# Patient Record
Sex: Female | Born: 1945 | Race: Black or African American | Hispanic: No | Smoking: Former smoker
Health system: Southern US, Community
[De-identification: ages and names within clinical notes are randomized; demographics above are authoritative.]

## PROBLEM LIST (undated history)

## (undated) DIAGNOSIS — N990 Postprocedural (acute) (chronic) kidney failure: Secondary | ICD-10-CM

## (undated) DIAGNOSIS — R011 Cardiac murmur, unspecified: Secondary | ICD-10-CM

## (undated) DIAGNOSIS — Z87448 Personal history of other diseases of urinary system: Secondary | ICD-10-CM

## (undated) DIAGNOSIS — M5410 Radiculopathy, site unspecified: Secondary | ICD-10-CM

## (undated) DIAGNOSIS — Z8619 Personal history of other infectious and parasitic diseases: Secondary | ICD-10-CM

## (undated) DIAGNOSIS — I639 Cerebral infarction, unspecified: Secondary | ICD-10-CM

## (undated) DIAGNOSIS — L853 Xerosis cutis: Secondary | ICD-10-CM

## (undated) DIAGNOSIS — I10 Essential (primary) hypertension: Secondary | ICD-10-CM

## (undated) DIAGNOSIS — M109 Gout, unspecified: Secondary | ICD-10-CM

## (undated) DIAGNOSIS — G4730 Sleep apnea, unspecified: Secondary | ICD-10-CM

## (undated) DIAGNOSIS — K219 Gastro-esophageal reflux disease without esophagitis: Secondary | ICD-10-CM

## (undated) DIAGNOSIS — J189 Pneumonia, unspecified organism: Secondary | ICD-10-CM

## (undated) DIAGNOSIS — T4145XA Adverse effect of unspecified anesthetic, initial encounter: Secondary | ICD-10-CM

## (undated) DIAGNOSIS — M75101 Unspecified rotator cuff tear or rupture of right shoulder, not specified as traumatic: Secondary | ICD-10-CM

## (undated) DIAGNOSIS — M7541 Impingement syndrome of right shoulder: Secondary | ICD-10-CM

## (undated) DIAGNOSIS — G629 Polyneuropathy, unspecified: Secondary | ICD-10-CM

## (undated) DIAGNOSIS — T8859XA Other complications of anesthesia, initial encounter: Secondary | ICD-10-CM

## (undated) DIAGNOSIS — M19019 Primary osteoarthritis, unspecified shoulder: Secondary | ICD-10-CM

## (undated) DIAGNOSIS — M1990 Unspecified osteoarthritis, unspecified site: Secondary | ICD-10-CM

## (undated) HISTORY — PX: TONSILLECTOMY: SUR1361

## (undated) HISTORY — PX: CARPAL TUNNEL RELEASE: SHX101

## (undated) HISTORY — DX: Cerebral infarction, unspecified: I63.9

## (undated) HISTORY — PX: BACK SURGERY: SHX140

## (undated) HISTORY — PX: ABDOMINAL HYSTERECTOMY: SHX81

## (undated) HISTORY — PX: APPENDECTOMY: SHX54

## (undated) HISTORY — PX: COLONOSCOPY: SHX174

## (undated) HISTORY — PX: TOE SURGERY: SHX1073

## (undated) HISTORY — PX: OTHER SURGICAL HISTORY: SHX169

## (undated) HISTORY — PX: JOINT REPLACEMENT: SHX530

---

## 1998-09-12 ENCOUNTER — Ambulatory Visit (HOSPITAL_COMMUNITY): Admission: RE | Admit: 1998-09-12 | Discharge: 1998-09-12 | Payer: Self-pay | Admitting: Family Medicine

## 1998-09-12 ENCOUNTER — Encounter: Payer: Self-pay | Admitting: Family Medicine

## 1999-01-09 ENCOUNTER — Other Ambulatory Visit: Admission: RE | Admit: 1999-01-09 | Discharge: 1999-01-09 | Payer: Self-pay | Admitting: Family Medicine

## 1999-03-06 ENCOUNTER — Encounter: Payer: Self-pay | Admitting: Family Medicine

## 1999-03-06 ENCOUNTER — Ambulatory Visit (HOSPITAL_COMMUNITY): Admission: RE | Admit: 1999-03-06 | Discharge: 1999-03-06 | Payer: Self-pay | Admitting: Family Medicine

## 1999-03-20 ENCOUNTER — Ambulatory Visit (HOSPITAL_COMMUNITY): Admission: RE | Admit: 1999-03-20 | Discharge: 1999-03-20 | Payer: Self-pay | Admitting: Family Medicine

## 1999-03-20 ENCOUNTER — Encounter: Payer: Self-pay | Admitting: Family Medicine

## 2000-01-21 ENCOUNTER — Encounter: Payer: Self-pay | Admitting: Emergency Medicine

## 2000-01-21 ENCOUNTER — Emergency Department (HOSPITAL_COMMUNITY): Admission: EM | Admit: 2000-01-21 | Discharge: 2000-01-21 | Payer: Self-pay | Admitting: Emergency Medicine

## 2000-01-29 ENCOUNTER — Ambulatory Visit (HOSPITAL_COMMUNITY): Admission: RE | Admit: 2000-01-29 | Discharge: 2000-01-29 | Payer: Self-pay | Admitting: *Deleted

## 2000-02-03 ENCOUNTER — Ambulatory Visit (HOSPITAL_COMMUNITY): Admission: RE | Admit: 2000-02-03 | Discharge: 2000-02-03 | Payer: Self-pay | Admitting: *Deleted

## 2000-03-22 ENCOUNTER — Encounter: Payer: Self-pay | Admitting: Family Medicine

## 2000-03-22 ENCOUNTER — Ambulatory Visit (HOSPITAL_COMMUNITY): Admission: RE | Admit: 2000-03-22 | Discharge: 2000-03-22 | Payer: Self-pay | Admitting: Family Medicine

## 2001-08-23 ENCOUNTER — Encounter: Payer: Self-pay | Admitting: Orthopedic Surgery

## 2001-08-23 ENCOUNTER — Ambulatory Visit (HOSPITAL_COMMUNITY): Admission: RE | Admit: 2001-08-23 | Discharge: 2001-08-23 | Payer: Self-pay | Admitting: Orthopedic Surgery

## 2001-10-10 ENCOUNTER — Encounter: Payer: Self-pay | Admitting: Family Medicine

## 2001-10-10 ENCOUNTER — Ambulatory Visit (HOSPITAL_COMMUNITY): Admission: RE | Admit: 2001-10-10 | Discharge: 2001-10-10 | Payer: Self-pay | Admitting: Family Medicine

## 2002-12-13 ENCOUNTER — Encounter: Admission: RE | Admit: 2002-12-13 | Discharge: 2002-12-13 | Payer: Self-pay | Admitting: Family Medicine

## 2002-12-13 ENCOUNTER — Encounter: Payer: Self-pay | Admitting: Family Medicine

## 2003-01-03 ENCOUNTER — Encounter (HOSPITAL_BASED_OUTPATIENT_CLINIC_OR_DEPARTMENT_OTHER): Payer: Self-pay | Admitting: General Surgery

## 2003-01-04 ENCOUNTER — Ambulatory Visit (HOSPITAL_COMMUNITY): Admission: RE | Admit: 2003-01-04 | Discharge: 2003-01-04 | Payer: Self-pay | Admitting: General Surgery

## 2003-06-12 ENCOUNTER — Other Ambulatory Visit: Admission: RE | Admit: 2003-06-12 | Discharge: 2003-06-12 | Payer: Self-pay | Admitting: Family Medicine

## 2003-06-28 ENCOUNTER — Ambulatory Visit (HOSPITAL_BASED_OUTPATIENT_CLINIC_OR_DEPARTMENT_OTHER): Admission: RE | Admit: 2003-06-28 | Discharge: 2003-06-28 | Payer: Self-pay | Admitting: Family Medicine

## 2005-07-21 ENCOUNTER — Ambulatory Visit (HOSPITAL_COMMUNITY): Admission: RE | Admit: 2005-07-21 | Discharge: 2005-07-21 | Payer: Self-pay | Admitting: Family Medicine

## 2006-04-12 ENCOUNTER — Emergency Department (HOSPITAL_COMMUNITY): Admission: EM | Admit: 2006-04-12 | Discharge: 2006-04-12 | Payer: Self-pay | Admitting: Family Medicine

## 2006-04-20 ENCOUNTER — Emergency Department (HOSPITAL_COMMUNITY): Admission: EM | Admit: 2006-04-20 | Discharge: 2006-04-20 | Payer: Self-pay | Admitting: Family Medicine

## 2006-05-13 ENCOUNTER — Emergency Department (HOSPITAL_COMMUNITY): Admission: EM | Admit: 2006-05-13 | Discharge: 2006-05-13 | Payer: Self-pay | Admitting: Family Medicine

## 2006-05-18 ENCOUNTER — Emergency Department (HOSPITAL_COMMUNITY): Admission: EM | Admit: 2006-05-18 | Discharge: 2006-05-18 | Payer: Self-pay | Admitting: Family Medicine

## 2006-09-22 ENCOUNTER — Ambulatory Visit: Payer: Self-pay | Admitting: General Practice

## 2007-01-23 ENCOUNTER — Emergency Department (HOSPITAL_COMMUNITY): Admission: EM | Admit: 2007-01-23 | Discharge: 2007-01-23 | Payer: Self-pay | Admitting: Emergency Medicine

## 2008-10-03 ENCOUNTER — Ambulatory Visit: Payer: Self-pay | Admitting: General Practice

## 2008-12-07 HISTORY — PX: REPLACEMENT TOTAL KNEE: SUR1224

## 2009-02-25 ENCOUNTER — Emergency Department (HOSPITAL_COMMUNITY): Admission: EM | Admit: 2009-02-25 | Discharge: 2009-02-25 | Payer: Self-pay | Admitting: Emergency Medicine

## 2009-10-09 ENCOUNTER — Ambulatory Visit: Payer: Self-pay | Admitting: General Practice

## 2009-11-27 ENCOUNTER — Inpatient Hospital Stay (HOSPITAL_COMMUNITY): Admission: RE | Admit: 2009-11-27 | Discharge: 2009-12-03 | Payer: Self-pay | Admitting: Orthopedic Surgery

## 2009-12-06 ENCOUNTER — Emergency Department (HOSPITAL_COMMUNITY): Admission: EM | Admit: 2009-12-06 | Discharge: 2009-12-06 | Payer: Self-pay | Admitting: Emergency Medicine

## 2010-09-02 ENCOUNTER — Other Ambulatory Visit: Admission: RE | Admit: 2010-09-02 | Discharge: 2010-09-02 | Payer: Self-pay | Admitting: Family Medicine

## 2010-10-01 ENCOUNTER — Ambulatory Visit: Payer: Self-pay | Admitting: General Practice

## 2011-03-09 LAB — POCT I-STAT 3, ART BLOOD GAS (G3+)
Acid-base deficit: 6 mmol/L — ABNORMAL HIGH (ref 0.0–2.0)
Bicarbonate: 19.4 mEq/L — ABNORMAL LOW (ref 20.0–24.0)
O2 Saturation: 91 %
Patient temperature: 98.7
TCO2: 20 mmol/L (ref 0–100)
pCO2 arterial: 37.2 mmHg (ref 35.0–45.0)
pH, Arterial: 7.326 — ABNORMAL LOW (ref 7.350–7.400)
pO2, Arterial: 65 mmHg — ABNORMAL LOW (ref 80.0–100.0)

## 2011-03-09 LAB — BASIC METABOLIC PANEL
BUN: 13 mg/dL (ref 6–23)
BUN: 17 mg/dL (ref 6–23)
BUN: 23 mg/dL (ref 6–23)
BUN: 3 mg/dL — ABNORMAL LOW (ref 6–23)
CO2: 22 mEq/L (ref 19–32)
CO2: 23 mEq/L (ref 19–32)
CO2: 24 mEq/L (ref 19–32)
CO2: 31 mEq/L (ref 19–32)
Calcium: 8.2 mg/dL — ABNORMAL LOW (ref 8.4–10.5)
Calcium: 8.2 mg/dL — ABNORMAL LOW (ref 8.4–10.5)
Calcium: 8.4 mg/dL (ref 8.4–10.5)
Calcium: 9.2 mg/dL (ref 8.4–10.5)
Chloride: 101 mEq/L (ref 96–112)
Chloride: 103 mEq/L (ref 96–112)
Chloride: 111 mEq/L (ref 96–112)
Chloride: 99 mEq/L (ref 96–112)
Creatinine, Ser: 0.75 mg/dL (ref 0.4–1.2)
Creatinine, Ser: 1.1 mg/dL (ref 0.4–1.2)
Creatinine, Ser: 2.15 mg/dL — ABNORMAL HIGH (ref 0.4–1.2)
Creatinine, Ser: 3.09 mg/dL — ABNORMAL HIGH (ref 0.4–1.2)
GFR calc Af Amer: 18 mL/min — ABNORMAL LOW (ref >60)
GFR calc Af Amer: 28 mL/min — ABNORMAL LOW (ref >60)
GFR calc Af Amer: >60 mL/min (ref >60)
GFR calc Af Amer: >60 mL/min (ref >60)
GFR calc non Af Amer: 15 mL/min — ABNORMAL LOW (ref >60)
GFR calc non Af Amer: 23 mL/min — ABNORMAL LOW (ref >60)
GFR calc non Af Amer: 50 mL/min — ABNORMAL LOW (ref >60)
GFR calc non Af Amer: >60 mL/min (ref >60)
Glucose, Bld: 100 mg/dL — ABNORMAL HIGH (ref 70–99)
Glucose, Bld: 111 mg/dL — ABNORMAL HIGH (ref 70–99)
Glucose, Bld: 117 mg/dL — ABNORMAL HIGH (ref 70–99)
Glucose, Bld: 149 mg/dL — ABNORMAL HIGH (ref 70–99)
Potassium: 3 mEq/L — ABNORMAL LOW (ref 3.5–5.1)
Potassium: 3.9 mEq/L (ref 3.5–5.1)
Potassium: 4.1 mEq/L (ref 3.5–5.1)
Potassium: 4.3 mEq/L (ref 3.5–5.1)
Sodium: 137 mEq/L (ref 135–145)
Sodium: 137 mEq/L (ref 135–145)
Sodium: 141 mEq/L (ref 135–145)
Sodium: 142 mEq/L (ref 135–145)

## 2011-03-09 LAB — DIFFERENTIAL
Basophils Absolute: 0 10*3/uL (ref 0.0–0.1)
Basophils Absolute: 0 10*3/uL (ref 0.0–0.1)
Basophils Absolute: 0 10*3/uL (ref 0.0–0.1)
Basophils Absolute: 0 10*3/uL (ref 0.0–0.1)
Basophils Absolute: 0 10*3/uL (ref 0.0–0.1)
Basophils Absolute: 0 10*3/uL (ref 0.0–0.1)
Basophils Relative: 0 % (ref 0–1)
Basophils Relative: 0 % (ref 0–1)
Basophils Relative: 0 % (ref 0–1)
Basophils Relative: 0 % (ref 0–1)
Basophils Relative: 0 % (ref 0–1)
Basophils Relative: 0 % (ref 0–1)
Eosinophils Absolute: 0 10*3/uL (ref 0.0–0.7)
Eosinophils Absolute: 0 10*3/uL (ref 0.0–0.7)
Eosinophils Absolute: 0.2 10*3/uL (ref 0.0–0.7)
Eosinophils Absolute: 0.2 10*3/uL (ref 0.0–0.7)
Eosinophils Absolute: 0.3 10*3/uL (ref 0.0–0.7)
Eosinophils Absolute: 0.3 10*3/uL (ref 0.0–0.7)
Eosinophils Relative: 0 % (ref 0–5)
Eosinophils Relative: 0 % (ref 0–5)
Eosinophils Relative: 2 % (ref 0–5)
Eosinophils Relative: 2 % (ref 0–5)
Eosinophils Relative: 3 % (ref 0–5)
Eosinophils Relative: 4 % (ref 0–5)
Lymphocytes Relative: 21 % (ref 12–46)
Lymphocytes Relative: 21 % (ref 12–46)
Lymphocytes Relative: 27 % (ref 12–46)
Lymphocytes Relative: 28 % (ref 12–46)
Lymphocytes Relative: 28 % (ref 12–46)
Lymphocytes Relative: 55 % — ABNORMAL HIGH (ref 12–46)
Lymphs Abs: 2.3 10*3/uL (ref 0.7–4.0)
Lymphs Abs: 2.3 10*3/uL (ref 0.7–4.0)
Lymphs Abs: 2.8 10*3/uL (ref 0.7–4.0)
Lymphs Abs: 3.1 10*3/uL (ref 0.7–4.0)
Lymphs Abs: 3.3 10*3/uL (ref 0.7–4.0)
Lymphs Abs: 7.1 10*3/uL — ABNORMAL HIGH (ref 0.7–4.0)
Monocytes Absolute: 0.9 10*3/uL (ref 0.1–1.0)
Monocytes Absolute: 1.2 10*3/uL — ABNORMAL HIGH (ref 0.1–1.0)
Monocytes Absolute: 1.3 10*3/uL — ABNORMAL HIGH (ref 0.1–1.0)
Monocytes Absolute: 1.4 10*3/uL — ABNORMAL HIGH (ref 0.1–1.0)
Monocytes Absolute: 1.8 10*3/uL — ABNORMAL HIGH (ref 0.1–1.0)
Monocytes Absolute: 2.1 10*3/uL — ABNORMAL HIGH (ref 0.1–1.0)
Monocytes Relative: 12 % (ref 3–12)
Monocytes Relative: 13 % — ABNORMAL HIGH (ref 3–12)
Monocytes Relative: 14 % — ABNORMAL HIGH (ref 3–12)
Monocytes Relative: 15 % — ABNORMAL HIGH (ref 3–12)
Monocytes Relative: 15 % — ABNORMAL HIGH (ref 3–12)
Monocytes Relative: 7 % (ref 3–12)
Neutro Abs: 4.5 10*3/uL (ref 1.7–7.7)
Neutro Abs: 4.7 10*3/uL (ref 1.7–7.7)
Neutro Abs: 4.7 10*3/uL (ref 1.7–7.7)
Neutro Abs: 6.9 10*3/uL (ref 1.7–7.7)
Neutro Abs: 9 10*3/uL — ABNORMAL HIGH (ref 1.7–7.7)
Neutro Abs: 9.3 10*3/uL — ABNORMAL HIGH (ref 1.7–7.7)
Neutrophils Relative %: 36 % — ABNORMAL LOW (ref 43–77)
Neutrophils Relative %: 54 % (ref 43–77)
Neutrophils Relative %: 55 % (ref 43–77)
Neutrophils Relative %: 58 % (ref 43–77)
Neutrophils Relative %: 64 % (ref 43–77)
Neutrophils Relative %: 66 % (ref 43–77)

## 2011-03-09 LAB — LIPASE, BLOOD: Lipase: 26 U/L (ref 11–59)

## 2011-03-09 LAB — COMPREHENSIVE METABOLIC PANEL
ALT: 13 U/L (ref 0–35)
ALT: 22 U/L (ref 0–35)
ALT: 27 U/L (ref 0–35)
ALT: 28 U/L (ref 0–35)
AST: 28 U/L (ref 0–37)
AST: 32 U/L (ref 0–37)
AST: 38 U/L — ABNORMAL HIGH (ref 0–37)
AST: 40 U/L — ABNORMAL HIGH (ref 0–37)
Albumin: 2.3 g/dL — ABNORMAL LOW (ref 3.5–5.2)
Albumin: 2.3 g/dL — ABNORMAL LOW (ref 3.5–5.2)
Albumin: 2.7 g/dL — ABNORMAL LOW (ref 3.5–5.2)
Albumin: 4 g/dL (ref 3.5–5.2)
Alkaline Phosphatase: 107 U/L (ref 39–117)
Alkaline Phosphatase: 132 U/L — ABNORMAL HIGH (ref 39–117)
Alkaline Phosphatase: 134 U/L — ABNORMAL HIGH (ref 39–117)
Alkaline Phosphatase: 79 U/L (ref 39–117)
BUN: 2 mg/dL — ABNORMAL LOW (ref 6–23)
BUN: 23 mg/dL (ref 6–23)
BUN: 5 mg/dL — ABNORMAL LOW (ref 6–23)
BUN: 6 mg/dL (ref 6–23)
CO2: 22 mEq/L (ref 19–32)
CO2: 27 mEq/L (ref 19–32)
CO2: 27 mEq/L (ref 19–32)
CO2: 28 mEq/L (ref 19–32)
Calcium: 10.3 mg/dL (ref 8.4–10.5)
Calcium: 7.6 mg/dL — ABNORMAL LOW (ref 8.4–10.5)
Calcium: 8.4 mg/dL (ref 8.4–10.5)
Calcium: 8.6 mg/dL (ref 8.4–10.5)
Chloride: 107 mEq/L (ref 96–112)
Chloride: 107 mEq/L (ref 96–112)
Chloride: 108 mEq/L (ref 96–112)
Chloride: 109 mEq/L (ref 96–112)
Creatinine, Ser: 0.81 mg/dL (ref 0.4–1.2)
Creatinine, Ser: 0.81 mg/dL (ref 0.4–1.2)
Creatinine, Ser: 0.83 mg/dL (ref 0.4–1.2)
Creatinine, Ser: 2.32 mg/dL — ABNORMAL HIGH (ref 0.4–1.2)
GFR calc Af Amer: 26 mL/min — ABNORMAL LOW (ref >60)
GFR calc Af Amer: >60 mL/min (ref >60)
GFR calc Af Amer: >60 mL/min (ref >60)
GFR calc Af Amer: >60 mL/min (ref >60)
GFR calc non Af Amer: 21 mL/min — ABNORMAL LOW (ref >60)
GFR calc non Af Amer: >60 mL/min (ref >60)
GFR calc non Af Amer: >60 mL/min (ref >60)
GFR calc non Af Amer: >60 mL/min (ref >60)
Glucose, Bld: 113 mg/dL — ABNORMAL HIGH (ref 70–99)
Glucose, Bld: 88 mg/dL (ref 70–99)
Glucose, Bld: 97 mg/dL (ref 70–99)
Glucose, Bld: 99 mg/dL (ref 70–99)
Potassium: 3.1 mEq/L — ABNORMAL LOW (ref 3.5–5.1)
Potassium: 3.5 mEq/L (ref 3.5–5.1)
Potassium: 4 mEq/L (ref 3.5–5.1)
Potassium: 4.3 mEq/L (ref 3.5–5.1)
Sodium: 139 mEq/L (ref 135–145)
Sodium: 139 mEq/L (ref 135–145)
Sodium: 142 mEq/L (ref 135–145)
Sodium: 143 mEq/L (ref 135–145)
Total Bilirubin: 0.5 mg/dL (ref 0.3–1.2)
Total Bilirubin: 0.5 mg/dL (ref 0.3–1.2)
Total Bilirubin: 0.7 mg/dL (ref 0.3–1.2)
Total Bilirubin: 0.9 mg/dL (ref 0.3–1.2)
Total Protein: 6.1 g/dL (ref 6.0–8.3)
Total Protein: 6.1 g/dL (ref 6.0–8.3)
Total Protein: 6.4 g/dL (ref 6.0–8.3)
Total Protein: 8.1 g/dL (ref 6.0–8.3)

## 2011-03-09 LAB — CBC
HCT: 23.1 % — ABNORMAL LOW (ref 36.0–46.0)
HCT: 24 % — ABNORMAL LOW (ref 36.0–46.0)
HCT: 24.4 % — ABNORMAL LOW (ref 36.0–46.0)
HCT: 25.2 % — ABNORMAL LOW (ref 36.0–46.0)
HCT: 25.5 % — ABNORMAL LOW (ref 36.0–46.0)
HCT: 27.2 % — ABNORMAL LOW (ref 36.0–46.0)
HCT: 27.9 % — ABNORMAL LOW (ref 36.0–46.0)
HCT: 29.4 % — ABNORMAL LOW (ref 36.0–46.0)
HCT: 32.3 % — ABNORMAL LOW (ref 36.0–46.0)
HCT: 40.2 % (ref 36.0–46.0)
Hemoglobin: 11 g/dL — ABNORMAL LOW (ref 12.0–15.0)
Hemoglobin: 13.7 g/dL (ref 12.0–15.0)
Hemoglobin: 7.9 g/dL — ABNORMAL LOW (ref 12.0–15.0)
Hemoglobin: 8.1 g/dL — ABNORMAL LOW (ref 12.0–15.0)
Hemoglobin: 8.2 g/dL — ABNORMAL LOW (ref 12.0–15.0)
Hemoglobin: 8.5 g/dL — ABNORMAL LOW (ref 12.0–15.0)
Hemoglobin: 8.6 g/dL — ABNORMAL LOW (ref 12.0–15.0)
Hemoglobin: 9.4 g/dL — ABNORMAL LOW (ref 12.0–15.0)
Hemoglobin: 9.5 g/dL — ABNORMAL LOW (ref 12.0–15.0)
Hemoglobin: 9.9 g/dL — ABNORMAL LOW (ref 12.0–15.0)
MCHC: 33.6 g/dL (ref 30.0–36.0)
MCHC: 33.7 g/dL (ref 30.0–36.0)
MCHC: 33.7 g/dL (ref 30.0–36.0)
MCHC: 33.8 g/dL (ref 30.0–36.0)
MCHC: 33.8 g/dL (ref 30.0–36.0)
MCHC: 33.9 g/dL (ref 30.0–36.0)
MCHC: 34 g/dL (ref 30.0–36.0)
MCHC: 34.1 g/dL (ref 30.0–36.0)
MCHC: 34.3 g/dL (ref 30.0–36.0)
MCHC: 34.9 g/dL (ref 30.0–36.0)
MCV: 83.8 fL (ref 78.0–100.0)
MCV: 84.4 fL (ref 78.0–100.0)
MCV: 84.5 fL (ref 78.0–100.0)
MCV: 84.6 fL (ref 78.0–100.0)
MCV: 84.6 fL (ref 78.0–100.0)
MCV: 84.6 fL (ref 78.0–100.0)
MCV: 84.6 fL (ref 78.0–100.0)
MCV: 84.7 fL (ref 78.0–100.0)
MCV: 84.7 fL (ref 78.0–100.0)
MCV: 85.9 fL (ref 78.0–100.0)
Platelets: 204 10*3/uL (ref 150–400)
Platelets: 205 10*3/uL (ref 150–400)
Platelets: 227 10*3/uL (ref 150–400)
Platelets: 229 10*3/uL (ref 150–400)
Platelets: 246 10*3/uL (ref 150–400)
Platelets: 251 10*3/uL (ref 150–400)
Platelets: 274 10*3/uL (ref 150–400)
Platelets: 281 10*3/uL (ref 150–400)
Platelets: 302 10*3/uL (ref 150–400)
Platelets: 471 10*3/uL — ABNORMAL HIGH (ref 150–400)
RBC: 2.73 MIL/uL — ABNORMAL LOW (ref 3.87–5.11)
RBC: 2.84 MIL/uL — ABNORMAL LOW (ref 3.87–5.11)
RBC: 2.88 MIL/uL — ABNORMAL LOW (ref 3.87–5.11)
RBC: 2.98 MIL/uL — ABNORMAL LOW (ref 3.87–5.11)
RBC: 3.02 MIL/uL — ABNORMAL LOW (ref 3.87–5.11)
RBC: 3.16 MIL/uL — ABNORMAL LOW (ref 3.87–5.11)
RBC: 3.3 MIL/uL — ABNORMAL LOW (ref 3.87–5.11)
RBC: 3.47 MIL/uL — ABNORMAL LOW (ref 3.87–5.11)
RBC: 3.82 MIL/uL — ABNORMAL LOW (ref 3.87–5.11)
RBC: 4.8 MIL/uL (ref 3.87–5.11)
RDW: 14.5 % (ref 11.5–15.5)
RDW: 14.5 % (ref 11.5–15.5)
RDW: 14.6 % (ref 11.5–15.5)
RDW: 14.7 % (ref 11.5–15.5)
RDW: 14.7 % (ref 11.5–15.5)
RDW: 14.7 % (ref 11.5–15.5)
RDW: 14.7 % (ref 11.5–15.5)
RDW: 15 % (ref 11.5–15.5)
RDW: 15 % (ref 11.5–15.5)
RDW: 15.1 % (ref 11.5–15.5)
WBC: 10.9 10*3/uL — ABNORMAL HIGH (ref 4.0–10.5)
WBC: 11.4 10*3/uL — ABNORMAL HIGH (ref 4.0–10.5)
WBC: 11.9 10*3/uL — ABNORMAL HIGH (ref 4.0–10.5)
WBC: 13 10*3/uL — ABNORMAL HIGH (ref 4.0–10.5)
WBC: 13.7 10*3/uL — ABNORMAL HIGH (ref 4.0–10.5)
WBC: 13.7 10*3/uL — ABNORMAL HIGH (ref 4.0–10.5)
WBC: 14.6 10*3/uL — ABNORMAL HIGH (ref 4.0–10.5)
WBC: 16.9 10*3/uL — ABNORMAL HIGH (ref 4.0–10.5)
WBC: 8.4 10*3/uL (ref 4.0–10.5)
WBC: 8.6 10*3/uL (ref 4.0–10.5)

## 2011-03-09 LAB — CULTURE, BLOOD (ROUTINE X 2)
Culture: NO GROWTH
Culture: NO GROWTH

## 2011-03-09 LAB — PROTIME-INR
INR: 0.94 (ref 0.00–1.49)
INR: 1.03 (ref 0.00–1.49)
INR: 1.56 — ABNORMAL HIGH (ref 0.00–1.49)
INR: 2.06 — ABNORMAL HIGH (ref 0.00–1.49)
INR: 2.15 — ABNORMAL HIGH (ref 0.00–1.49)
INR: 2.56 — ABNORMAL HIGH (ref 0.00–1.49)
INR: 2.66 — ABNORMAL HIGH (ref 0.00–1.49)
INR: 3.3 — ABNORMAL HIGH (ref 0.00–1.49)
Prothrombin Time: 12.5 seconds (ref 11.6–15.2)
Prothrombin Time: 13.4 seconds (ref 11.6–15.2)
Prothrombin Time: 18.5 seconds — ABNORMAL HIGH (ref 11.6–15.2)
Prothrombin Time: 23 seconds — ABNORMAL HIGH (ref 11.6–15.2)
Prothrombin Time: 23.8 seconds — ABNORMAL HIGH (ref 11.6–15.2)
Prothrombin Time: 27.3 seconds — ABNORMAL HIGH (ref 11.6–15.2)
Prothrombin Time: 28.1 seconds — ABNORMAL HIGH (ref 11.6–15.2)
Prothrombin Time: 33.3 seconds — ABNORMAL HIGH (ref 11.6–15.2)

## 2011-03-09 LAB — URINE CULTURE
Colony Count: NO GROWTH
Colony Count: NO GROWTH
Culture: NO GROWTH
Culture: NO GROWTH
Special Requests: NEGATIVE

## 2011-03-09 LAB — CROSSMATCH
ABO/RH(D): A POS
Antibody Screen: NEGATIVE

## 2011-03-09 LAB — URINE MICROSCOPIC-ADD ON

## 2011-03-09 LAB — CARDIAC PANEL(CRET KIN+CKTOT+MB+TROPI)
CK, MB: 24.5 ng/mL — ABNORMAL HIGH (ref 0.3–4.0)
CK, MB: 30.1 ng/mL — ABNORMAL HIGH (ref 0.3–4.0)
CK, MB: 31.1 ng/mL — ABNORMAL HIGH (ref 0.3–4.0)
Relative Index: 2.3 (ref 0.0–2.5)
Relative Index: 2.7 — ABNORMAL HIGH (ref 0.0–2.5)
Relative Index: 3.2 — ABNORMAL HIGH (ref 0.0–2.5)
Total CK: 1044 U/L — ABNORMAL HIGH (ref 7–177)
Total CK: 1108 U/L — ABNORMAL HIGH (ref 7–177)
Total CK: 977 U/L — ABNORMAL HIGH (ref 7–177)
Troponin I: 0.01 ng/mL (ref 0.00–0.06)
Troponin I: 0.01 ng/mL (ref 0.00–0.06)
Troponin I: <0.01 ng/mL (ref 0.00–0.06)

## 2011-03-09 LAB — CARBOXYHEMOGLOBIN
Carboxyhemoglobin: 1.3 % (ref 0.5–1.5)
Methemoglobin: 1.6 % — ABNORMAL HIGH (ref 0.0–1.5)
O2 Saturation: 60.5 %
Total hemoglobin: 8.4 g/dL — ABNORMAL LOW (ref 12.5–16.0)

## 2011-03-09 LAB — URINALYSIS, ROUTINE W REFLEX MICROSCOPIC
Bilirubin Urine: NEGATIVE
Bilirubin Urine: NEGATIVE
Glucose, UA: NEGATIVE mg/dL
Glucose, UA: NEGATIVE mg/dL
Ketones, ur: NEGATIVE mg/dL
Ketones, ur: NEGATIVE mg/dL
Leukocytes, UA: NEGATIVE
Nitrite: NEGATIVE
Nitrite: NEGATIVE
Protein, ur: 30 mg/dL — AB
Protein, ur: NEGATIVE mg/dL
Specific Gravity, Urine: 1.012 (ref 1.005–1.030)
Specific Gravity, Urine: 1.017 (ref 1.005–1.030)
Urobilinogen, UA: 0.2 mg/dL (ref 0.0–1.0)
Urobilinogen, UA: 0.2 mg/dL (ref 0.0–1.0)
pH: 5 (ref 5.0–8.0)
pH: 6 (ref 5.0–8.0)

## 2011-03-09 LAB — IRON AND TIBC
Iron: <10 ug/dL — ABNORMAL LOW (ref 42–135)
UIBC: 229 ug/dL

## 2011-03-09 LAB — HEPATIC FUNCTION PANEL
ALT: 13 U/L (ref 0–35)
AST: 26 U/L (ref 0–37)
Albumin: 2.8 g/dL — ABNORMAL LOW (ref 3.5–5.2)
Alkaline Phosphatase: 70 U/L (ref 39–117)
Bilirubin, Direct: 0.3 mg/dL (ref 0.0–0.3)
Indirect Bilirubin: 0.4 mg/dL (ref 0.3–0.9)
Total Bilirubin: 0.7 mg/dL (ref 0.3–1.2)
Total Protein: 6.1 g/dL (ref 6.0–8.3)

## 2011-03-09 LAB — LACTIC ACID, PLASMA
Lactic Acid, Venous: 1 mmol/L (ref 0.5–2.2)
Lactic Acid, Venous: 1.1 mmol/L (ref 0.5–2.2)

## 2011-03-09 LAB — TSH: TSH: 0.647 u[IU]/mL (ref 0.350–4.500)

## 2011-03-09 LAB — BRAIN NATRIURETIC PEPTIDE: Pro B Natriuretic peptide (BNP): 41 pg/mL (ref 0.0–100.0)

## 2011-03-09 LAB — ABO/RH: ABO/RH(D): A POS

## 2011-03-09 LAB — TYPE AND SCREEN
ABO/RH(D): A POS
Antibody Screen: NEGATIVE

## 2011-03-09 LAB — MAGNESIUM
Magnesium: 1.8 mg/dL (ref 1.5–2.5)
Magnesium: 2 mg/dL (ref 1.5–2.5)

## 2011-03-09 LAB — MRSA PCR SCREENING: MRSA by PCR: NEGATIVE

## 2011-03-09 LAB — RETICULOCYTES
RBC.: 3 MIL/uL — ABNORMAL LOW (ref 3.87–5.11)
Retic Count, Absolute: 39 10*3/uL (ref 19.0–186.0)
Retic Ct Pct: 1.3 % (ref 0.4–3.1)

## 2011-03-09 LAB — FOLATE: Folate: 7.2 ng/mL

## 2011-03-09 LAB — APTT: aPTT: 36 seconds (ref 24–37)

## 2011-03-09 LAB — D-DIMER, QUANTITATIVE: D-Dimer, Quant: 2 ug/mL-FEU — ABNORMAL HIGH (ref 0.00–0.48)

## 2011-03-09 LAB — CORTISOL-AM, BLOOD: Cortisol - AM: 22.8 ug/dL — ABNORMAL HIGH (ref 4.3–22.4)

## 2011-03-09 LAB — PREPARE RBC (CROSSMATCH)

## 2011-03-09 LAB — VITAMIN B12: Vitamin B-12: 422 pg/mL (ref 211–911)

## 2011-03-09 LAB — FERRITIN: Ferritin: 110 ng/mL (ref 10–291)

## 2011-03-09 LAB — PATHOLOGIST SMEAR REVIEW

## 2011-04-24 NOTE — Op Note (Signed)
[place]. [facility]  Patient:    Ellen Henry, Ellen Henry Visit Number: [id number] MRN: [id number]          Service Type: DSU Location: [facility] 2899 23 Attending Physician:  Wende Mott Dictated by:   Kennieth Rad, M.D. Proc. Date: 08/23/01 Admit Date:  08/23/2001 Discharge Date: 08/23/2001                             Operative Report  PREOPERATIVE DIAGNOSIS:  Internal derangement, right knee.  POSTOPERATIVE DIAGNOSES: 1. Chronic synovitis. 2. 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  ANESTHESIA:  General.  PROCEDURE:  Arthroscopic synovectomy, complete chondroplasty, medial and femoral condyle and patella and intercondylar notch and removal of loose bodies.  DESCRIPTION OF PROCEDURE:  The patient was taken to the operating room after given adequate preoperative medication, then given general anesthesia and intubated.  The right knee was prepped with Duraprep and draped in a sterile manner.  Tourniquet was used for hemostasis, 1/2-inch puncture wound was made along the anterior, medial, and lateral joint line.  Medial and suprapatellar pouch area was _________ . Inspection of the joint revealed superior condylar defect involving medial femoral condyle, size of a 25 cent piece, with loose fragments and fraying at the intercondylar notch and condylar defect involving the inferior pole over the patella. Thickened synovium, both medial and lateral compartment, with plica and loose fragments. Medial and lateral menisci were intact as well as the lateral femoral condyle.  Complete synovectomy was done with removal of loose bodies and chondroplasty was done with roughening and smoothing depending on the surface and removal of loose fragments and roughening of the area.  Copious irrigation was done and further inspection did not reveal any loose fragments in bilateral pouch or suprapatellar pouch  area.  Wound closure was then done with 0 nylon, 0.5% Marcaine with epinephrine was injected into the joint.  Compressive dressing was applied and the patient tolerated the procedure quite well and went to the recovery room in stable and satisfactory condition.  The patient is being discharged home on Percocet one q.4h. p.r.n. pain.  Return to the office in one week.  Ice packs, elevation, use of crutches, and partial weight-bearing on the right side.  The patient was discharged in stable and satisfactory condition. Dictated by:   Kennieth Rad, M.D. Attending Physician:  Wende Mott DD:  09/14/01 TD:  09/14/01 Job: [id number]

## 2011-04-24 NOTE — H&P (Signed)
[place]. [facility]  Patient:    Ellen Henry, BUSK Visit Number: [id number] MRN: [id number]          Service Type: DSU Location: [facility] 2899 23 Attending Physician:  Wende Mott Dictated by:   Kennieth Rad, M.D. Admit Date:  08/23/2001 Discharge Date: 08/23/2001                           History and Physical  CHIEF COMPLAINT:  Painful swollen right knee.  HISTORY OF PRESENT ILLNESS:  This 65 year old we have been following in the office by internal derangement and synovitis involving the right knee with buckling and giving way of the right knee and difficulty getting up from a sitting position.  PAST MEDICAL HISTORY:  Tonsillectomy, appendectomy, carpal tunnel release, and hysterectomy.  No history of high blood pressure or diabetes.  ALLERGIES:  None.  MEDICATIONS:  Celebrex and ibuprofen.  HABITS:  Smokes 1-2 packs a day for 30 years.  FAMILY HISTORY:  Noncontributory.  REVIEW OF SYSTEMS:  Basically as in history of present illness.  No cardiac or respiratory, no urinary or bowel symptoms.  PHYSICAL EXAMINATION:  VITAL SIGNS:  Temperature 97.2, pulse 80, respirations 18, blood pressure 118/80, height 5 feet 1 inch, weight 263 pounds.  HEENT:  Normocephalic.  Eyes:  Conjunctivae clear.  NECK:  Supple.  CHEST:  Clear.  CARDIAC:  S1, S2 regular.  EXTREMITIES:  Right knee +3 fusion, markedly tender medial compartment.  There is an audible click, possible varus test, negative draws, negative Lachmans test.  IMPRESSION: 1. Internal derangement, right knee. 2. Synovitis, right knee. Dictated by:   Kennieth Rad, M.D. Attending Physician:  Wende Mott DD:  09/14/01 TD:  09/14/01 Job: [id number]

## 2011-04-24 NOTE — Op Note (Signed)
   NAMEALLISON, Ellen Henry                          ACCOUNT NO.:  [id number]   MEDICAL RECORD NO.:  [id number]                   PATIENT TYPE:  OIB   LOCATION:  NA                                   FACILITY:  [facility]   PHYSICIAN:  Leonie Man, M.D.                DATE OF BIRTH:  December 30, 1945   DATE OF PROCEDURE:  01/04/2003  DATE OF DISCHARGE:                                 OPERATIVE REPORT   PREOPERATIVE DIAGNOSIS:  Foreign body (glass) in the left foot.   POSTOPERATIVE DIAGNOSIS:  Foreign body (glass) in the left foot.   PROCEDURE:  Removal of foreign body, left foot.   SURGEON:  Leonie Man, M.D.   ASSISTANT:  Nurse.   ANESTHESIA:  General.   INDICATIONS FOR PROCEDURE:  This patient is a 65 year old woman having  stepped on some glass.  She attempted removal of the glass on her own, but  was unable to do so and she has been having continued pain from this region  in her left heel.  X-rays show a foreign body consistent with glass within  the left foot.  She comes to the operating room now for removal of the  foreign body.   The risks and potential benefits of surgery have been discussed, questions  answered, and consent obtained.   DESCRIPTION OF PROCEDURE:  Following the induction of satisfactory general  anesthesia, the patient is positioned supinely.  The left foot is prepped  and draped to be included in a sterile operative field.  The area over the  foreign body was then incised down upon an elliptical incision, deepening  this ellipse down through the skin and subcutaneous tissues and removing the  entire ellipse of skin and subcutaneous tissues.  Within this, the glass  foreign body was located.  The foreign body was removed from the tissue and  prepared to be given to the patient. Hemostasis was obtained with  electrocautery.  The wound was closed with interrupted 4-0 nylon sutures.  A  sterile compressive dressing was applied, the anesthetic reversed and the  patient removed from the operating room to the recovery room in stable  condition. She tolerated the procedure well.                                               Leonie Man, M.D.    PB/MEDQ  D:  01/04/2003  T:  01/04/2003  Job:  [id number]

## 2011-04-24 NOTE — Procedures (Signed)
[place]. [facility]  Patient:    Ellen Henry, Ellen Henry                       MRN: [id number] Proc. Date: 01/29/00 Adm. Date:  [id number] Attending:  Sharyn Dross CC:         Geraldo Pitter, M.D.                           Procedure Report  REFERRING PHYSICIAN:  Geraldo Pitter, M.D.  PREOPERATIVE DIAGNOSIS:  Severe abdominal pains not responding to therapy.  POSTOPERATIVE DIAGNOSIS:  Grossly normal endoscopic examination.  Minimal gastritis appreciated.  PROCEDURE PERFORMED:  Esophagogastroduodenoscopy.  MEDICATIONS USED:  Demerol 60 mg IV, Versed 7 mg IV over a 15-minute period of time.  INSTRUMENT USED:  Olympus video panendoscope.  ENDOSCOPIST:  Sharyn Dross., M.D.  INDICATIONS FOR PROCEDURE:  This pleasant 65 year old female was referred for evaluation because of severe abdominal pains.  The patient describes the pains within the epigastric area without any radiation.  She states that the pain is severely hurting at this time.  The symptoms have been ongoing without any associated hematemesis, melena or hematochezias.  There is no history of any peptic ulcer disease or gallbladder or liver disease  that was present.  OBJECTIVE FINDINGS:  She is a pleasant female, obese in no acute distress.  Her  vital signs are stable.  Her HEENT examination is anicteric.  The neck was supple. Lungs were clear.  The heart had a regular rate and rhythm without heaves, thrills, murmurs or gallops.  The abdomen was soft with positive tenderness to palpation in the epigastric area, no rebound or referred tenderness appreciated.  No hepatosplenomegaly noted.  The patient is obese.  The extremities showed no clubbing, cyanosis or edema.  IMPRESSION:  Abdominal pains, possible pancreatitis, rule out biliary disease, ule out other causes at this time.  RECOMMENDATIONS:  I am presently going to proceed with the endoscopic examination.  INFORMED CONSENT:  The  patient was advised of the procedure, indications and the risks involved.  The patient has agreed to have the procedure performed at this  time.  In light of the possibilities of acid peptic disease being a contributing factor or even gastroesophageal reflux, the patient has agreed to have the procedure performed.  PREOPERATIVE PREPARATION:  The patient was brought to the endoscopy unit where n IV for IV conscious sedating medication was started.  A monitor was placed on the patient to monitor the patients vital signs and oxygen saturation.  Nasal oxygen at 2 L per minute was used and after adequate sedation was performed, the procedure was begun.  DESCRIPTION OF PROCEDURE:  The instrument was advanced with the patient in the eft lateral position via the direct technique without difficulty.  The oropharyngeal epliglottis, vocal cords and piriform sinuses appeared to be grossly within normal limits.  The esophagus was normal without any evidence of acute inflammation, ulcerations, hiatal hernias or varices appreciated.  The gastric area showed a normal mucous lake without any evidence of acute inflammation or ulcerations in the proximal portion of the gastric body that was noted.  However, there was mild inflammation of the antral area that was noted.  The pylorus was normal with decreased peristaltic activity that was noted.  Upon advancement through the pyloric canal, the duodenal bulb and second portion appeared to be within normal limits.  The instrument was  subsequently retracted back where biopsy for the CLO study was performed.  A retroflex view of the cardia although it was contracted, did not appreciate any evidence of a hiatal hernia or relaxed esophageal sphincter. The Z-line appeared to be approximately 36 cm distal esophagus that was noted.  There was no evidence of any postendoscopic trauma that was noted.  The instrument was subsequently retracted and removed  per orum without difficulty.  The patient tolerated the procedure well.  TREATMENT:  I am going to recommend a serum amylase level on the patient and abdominal ultrasound to rule out evidence of pancreatitis at this time.  The patient has also been noted to be anemic and we may need to proceed with a colonoscopic examination to evaluate the area especially in light of her discomforts.  Will check these tests and depending on the results to determine he course of therapy. DD:  01/29/00 TD:  01/29/00 Job: [id number]

## 2011-04-24 NOTE — Procedures (Signed)
[place]. [facility]  Patient:    Ellen Henry, Ellen Henry                       MRN: [id number] Proc. Date: 02/03/00 Adm. Date:  [id number] Attending:  Sharyn Dross CC:         Geraldo Pitter, M.D.                           Procedure Report  REFERRING PHYSICIAN:  Geraldo Pitter, M.D.  PREOPERATIVE DIAGNOSIS:  Persistent abdominal pains not responding to medical management.  POSTOPERATIVE DIAGNOSIS:  Normal colonoscopic examination to the cecum, except increased tortuosity.  PROCEDURE:  Colonoscopy.  ENDOSCOPIST:  Dortha Kern, Montez Hageman., M.D.  MEDICATIONS:  Demerol 100 mg IV, Versed 10 mg over 20 minute period of time.  INSTRUMENT:  Olympus video pancolonoscope.  INDICATIONS:  This pleasant 65 year old female presented to the office with acute abdominal pains appeared to be in the epigastric region at this time.   The pains were severe on a level of 1 to 10, was classified as a 9 to 10 out of 10 at that point.  Patient underwent an endoscopy based on the location of the pains and to ensure no evidence of upper GI pathology.  The endoscopic examination was normal. Patient was treated with medications to help with symptoms.  However, I was notified of her recurrent discomforts that were ongoing.  The patient has had to leave work because of the severity of her discomfort that was present.  She was  subsequently scheduled for a colonoscopic examination to rule out any evidence f colon pathology that is present.  OBJECTIVE FINDINGS:  She is an obese female, appears to be in no acute distress. Her vital signs are stable.  HEENT: anicteric.  NECK: supple.  LUNGS: clear. HEART: regular rate and rhythm without heaves, thrills, murmurs, or gallops. ABDOMEN: soft, no tenderness, no hepatosplenomegaly.  Digital rectal examination was normal.  EXTREMITIES:  no clubbing, cyanosis, or edema.  PLAN:  To proceed with the colonoscopic examination.  INFORMED CONSENT:   Patient was advised of procedure, indications, and risk involved.  The patient has agreed to have the procedure performed.   A video was reviewed and consent form was obtained.  PREOPERATIVE PREPARATION:  Patient was brought in the endoscopy unit with IV, where IV conscious sedating medication was started.  A monitor was placed on the patient to monitor the patients vital signs and oxygen saturation.  Nasal oxygen at 2 L/min was used and after adequate sedation was performed, the procedure was begun.  BOWEL PREP:   The patient was given GoLYTELY and Reglan as a bowel prep. Patient tolerated prep well without any complications.  The quality of the prep was excellent.  PROCEDURE NOTE:  The instrument was advanced, patient lying in left lateral position to approximately 95 cm proximal colon to the cecum.  This was confirmed by palpation and visualization of the ileocecal valve that was appreciated.  There appeared to be no gross abnormalities, such as, masses, polyps or stricture lesions appreciated.  The vascular pattern appeared to be well within normal limits throughout the entire colon.  The mucosal pattern showed no evidence of any granular changes or diverticular changes that was noted.   There was no evidence of internal or external hemorrhoids that was present within the colon region.  There was however noted some moderate increased tortuosity of the colon  throughout the entire colon.  The patient experienced a lot of difficulties with the instrument being traversed in the rectosigmoid area all the way through to the cecal region.  However, there was no gross pathology associated with the discomforts that she was experiencing.  There was no evidence of internal or external hemorrhoids upon exiting from the area.  The patient tolerated the procedure well.  TREATMENT: 1. I am going to start the patient on Levbid and Levsin to try to help with the  spasmodic pains.  At  present, there was a new medication, which will be    available in the next few months, which may help the increased spasticity that    was present.  However, during the interim the above medications should suffice. 2. Will have the patient follow up in the next week or two in the office.    Depending on these results will determine the course of therapy. DD:  02/03/00 TD:  02/03/00 Job: [id number]

## 2011-12-06 ENCOUNTER — Emergency Department (HOSPITAL_COMMUNITY)
Admission: EM | Admit: 2011-12-06 | Discharge: 2011-12-06 | Disposition: A | Discharge status: Home / Self Care | Payer: Medicare Other | Attending: Emergency Medicine | Admitting: Emergency Medicine

## 2011-12-06 ENCOUNTER — Encounter: Payer: Self-pay | Admitting: *Deleted

## 2011-12-06 DIAGNOSIS — M79609 Pain in unspecified limb: Secondary | ICD-10-CM | POA: Insufficient documentation

## 2011-12-06 DIAGNOSIS — M109 Gout, unspecified: Secondary | ICD-10-CM | POA: Insufficient documentation

## 2011-12-06 DIAGNOSIS — I10 Essential (primary) hypertension: Secondary | ICD-10-CM | POA: Insufficient documentation

## 2011-12-06 HISTORY — DX: Essential (primary) hypertension: I10

## 2011-12-06 MED ORDER — PREDNISONE 20 MG PO TABS: 40.0000 mg | ORAL_TABLET | Freq: Every day | ORAL | Status: AC

## 2011-12-06 MED ORDER — OXYCODONE-ACETAMINOPHEN 5-325 MG PO TABS: 1.0000 | ORAL_TABLET | ORAL | Status: AC | PRN

## 2011-12-06 MED ORDER — PREDNISONE 20 MG PO TABS
60.0000 mg | ORAL_TABLET | Freq: Once | ORAL | Status: AC
  Administered 2011-12-06: 60 mg via ORAL
  Filled 2011-12-06: qty 3

## 2011-12-06 MED ORDER — OXYCODONE-ACETAMINOPHEN 5-325 MG PO TABS
2.0000 | ORAL_TABLET | Freq: Once | ORAL | Status: AC
  Administered 2011-12-06: 2 via ORAL
  Filled 2011-12-06: qty 2

## 2011-12-06 MED ORDER — IBUPROFEN 200 MG PO TABS
400.0000 mg | ORAL_TABLET | Freq: Once | ORAL | Status: AC
  Administered 2011-12-06: 400 mg via ORAL
  Filled 2011-12-06: qty 2

## 2011-12-06 NOTE — ED Notes (Signed)
States has not taken BP meds d/t funds.

## 2011-12-06 NOTE — ED Notes (Signed)
Pt c/o left foot swelling and discoloration at base of left great toe.  States worsening.

## 2011-12-13 NOTE — ED Provider Notes (Signed)
History    65yF with pain in L foot. Distal foot near big toe. Gradual onset 1w ago. Denies trauma. Persistent. Worse with movement or touch. No fever or chills. No n/v. No hx of similar. Denies acute pain anywhere else. Denies hx of gout.   CSN: [id number]  Arrival date & time 12/06/11  1417   First MD Initiated Contact with Patient 12/06/11 1507      Chief Complaint  Patient presents with  . Foot Pain    (Consider location/radiation/quality/duration/timing/severity/associated sxs/prior treatment) HPI  Past Medical History  Diagnosis Date  . Hypertension     Past Surgical History  Procedure Date  . 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 No pertinent family history.  History  Substance Use Topics  . Smoking status: Not on file  . Smokeless tobacco: Not on file  . Alcohol Use: No    OB History    Grav Para Term Preterm Abortions TAB SAB Ect Mult Living                  Review of Systems   Review of symptoms negative unless otherwise noted in HPI.   Allergies  Review of patient's allergies indicates no known allergies.  Home Medications   Current Outpatient Rx  Name Route Sig Dispense Refill  . GUAIFENESIN ER 600 MG PO TB12 Oral Take 1,200 mg by mouth 2 (two) times daily. Congestion     . IBUPROFEN 200 MG PO TABS Oral Take 800 mg by mouth every 6 (six) hours as needed. For pain     . OXYCODONE-ACETAMINOPHEN 5-325 MG PO TABS Oral Take 1 tablet by mouth every 4 (four) hours as needed for pain. 10 tablet 0  . PREDNISONE 20 MG PO TABS Oral Take 2 tablets (40 mg total) by mouth daily. 10 tablet 0    BP 174/112  Pulse 86  Temp(Src) 98.2 F (36.8 C) (Oral)  Resp 20  Ht 5' (1.524 m)  Wt 280 lb (127.007 kg)  BMI 54.68 kg/m2  SpO2 98%  Physical Exam  Nursing note and vitals reviewed. Constitutional: She appears well-developed and well-nourished. No distress.  HENT:  Head: Normocephalic and atraumatic.  Eyes: Conjunctivae are  normal. Right eye exhibits no discharge. Left eye exhibits no discharge.  Neck: Neck supple.  Cardiovascular: Normal rate, regular rhythm and normal heart sounds.  Exam reveals no gallop and no friction rub.   No murmur heard. Pulmonary/Chest: Effort normal and breath sounds normal. No respiratory distress.  Abdominal: Soft. There is no tenderness.  Musculoskeletal:       Mild erythema area 1st MP joint L foot. Very tender to palpation. increased warmth. Severe pain with ROM. Good pedal pulses. NO pain with ROM or ankle. Skin intact.   Neurological: She is alert.  Skin: Skin is warm and dry.  Psychiatric: She has a normal mood and affect. Her behavior is normal. Thought content normal.    ED Course  Procedures (including critical care time)  Labs Reviewed - No data to display No results found.   1. Gout attack       MDM  65yF with pain in L foot. Consider gout, cellulitis, septic joint, fracture. Suspect gout with hx and location. Doubt septic joint or cellulitis. No hx of trauma so unlikely fracture. Plan nsaids, steroids and perccet prn. Strict return precautions discussed.       Raeford Razor, MD 12/13/11 [id number]

## 2011-12-28 ENCOUNTER — Emergency Department (HOSPITAL_COMMUNITY)
Admission: EM | Admit: 2011-12-28 | Discharge: 2011-12-28 | Disposition: A | Discharge status: Home / Self Care | Payer: Medicare Other | Attending: Emergency Medicine | Admitting: Emergency Medicine

## 2011-12-28 ENCOUNTER — Encounter (HOSPITAL_COMMUNITY): Payer: Self-pay

## 2011-12-28 DIAGNOSIS — M109 Gout, unspecified: Secondary | ICD-10-CM | POA: Insufficient documentation

## 2011-12-28 DIAGNOSIS — M79609 Pain in unspecified limb: Secondary | ICD-10-CM | POA: Insufficient documentation

## 2011-12-28 DIAGNOSIS — I10 Essential (primary) hypertension: Secondary | ICD-10-CM | POA: Insufficient documentation

## 2011-12-28 MED ORDER — IBUPROFEN 800 MG PO TABS: 800.0000 mg | ORAL_TABLET | Freq: Three times a day (TID) | ORAL | Status: AC

## 2011-12-28 MED ORDER — HYDROCODONE-ACETAMINOPHEN 5-325 MG PO TABS: 1.0000 | ORAL_TABLET | Freq: Four times a day (QID) | ORAL | Status: AC | PRN

## 2011-12-28 NOTE — ED Notes (Signed)
Discharge instructions reviewed; verbalizes understanding.  No questions asked; no further c/o's voiced.  Ambulatory to lobby. 

## 2011-12-28 NOTE — ED Provider Notes (Signed)
History     CSN: [id number]  Arrival date & time 12/28/11  1047   First MD Initiated Contact with Patient 12/28/11 1133      Chief Complaint  Patient presents with  . Gout    (Consider location/radiation/quality/duration/timing/severity/associated sxs/prior treatment) Patient is a 65 y.o. female presenting with lower extremity pain. The history is provided by the patient (The patient complains of severe right large toe pain she has a history of gout. She's run over medicines for her gout. She has an appointment to see Dr. Parke Simmers next month. To go over her gallop and her high blood pressure.).  Foot Pain This is a recurrent problem. The current episode started more than 2 days ago. The problem occurs constantly. The problem has not changed since onset.Pertinent negatives include no chest pain, no abdominal pain and no headaches. The symptoms are aggravated by walking. The symptoms are relieved by nothing. She has tried nothing for the symptoms. The treatment provided no relief.    Past Medical History  Diagnosis Date  . Hypertension     Past Surgical History  Procedure Date  . 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 No pertinent family history.  History  Substance Use Topics  . Smoking status: Not on file  . Smokeless tobacco: Not on file  . Alcohol Use: No    OB History    Grav Para Term Preterm Abortions TAB SAB Ect Mult Living                  Review of Systems  Constitutional: Negative for fatigue.  HENT: Negative for congestion, sinus pressure and ear discharge.   Eyes: Negative for discharge.  Respiratory: Negative for cough.   Cardiovascular: Negative for chest pain.  Gastrointestinal: Negative for abdominal pain and diarrhea.  Genitourinary: Negative for frequency and hematuria.  Musculoskeletal: Negative for back pain.       Pain right large toe  Skin: Negative for rash.  Neurological: Negative for seizures and headaches.    Hematological: Negative.   Psychiatric/Behavioral: Negative for hallucinations.    Allergies  Review of patient's allergies indicates no known allergies.  Home Medications   Current Outpatient Rx  Name Route Sig Dispense Refill  . HYDROCODONE-ACETAMINOPHEN 5-325 MG PO TABS Oral Take 1 tablet by mouth every 6 (six) hours as needed. For pain finished on 12/14/11    . IBUPROFEN 200 MG PO TABS Oral Take 800 mg by mouth every 6 (six) hours as needed. For pain    . HYDROCODONE-ACETAMINOPHEN 5-325 MG PO TABS Oral Take 1 tablet by mouth every 6 (six) hours as needed for pain. 40 tablet 0  . IBUPROFEN 800 MG PO TABS Oral Take 1 tablet (800 mg total) by mouth 3 (three) times daily. 42 tablet 0    BP 169/99  Pulse 88  Temp(Src) 98.3 F (36.8 C) (Oral)  Resp 16  SpO2 97%  Physical Exam  Constitutional: She is oriented to person, place, and time. She appears well-developed.  HENT:  Head: Normocephalic.  Eyes: Conjunctivae are normal.  Neck: No tracheal deviation present.  Musculoskeletal: She exhibits no edema.       Tender right large toe with swelling  Neurological: She is oriented to person, place, and time.  Skin: Skin is warm.  Psychiatric: She has a normal mood and affect.    ED Course  Procedures (including critical care time)  Labs Reviewed - No data to display No results  found.   1. Gout   2. Hypertention, malignant, with acute intensive management       MDM          Benny Lennert, MD 12/28/11 1154

## 2011-12-28 NOTE — ED Notes (Signed)
Hx of gout and sts it is flaring up in left great toe, sts comes and goes over the last several days.

## 2012-01-16 ENCOUNTER — Encounter (HOSPITAL_COMMUNITY): Payer: Self-pay | Admitting: Emergency Medicine

## 2012-01-16 ENCOUNTER — Emergency Department (HOSPITAL_COMMUNITY)
Admission: EM | Admit: 2012-01-16 | Discharge: 2012-01-16 | Disposition: A | Discharge status: Home / Self Care | Payer: Medicare Other | Attending: Emergency Medicine | Admitting: Emergency Medicine

## 2012-01-16 DIAGNOSIS — I10 Essential (primary) hypertension: Secondary | ICD-10-CM | POA: Insufficient documentation

## 2012-01-16 DIAGNOSIS — M25569 Pain in unspecified knee: Secondary | ICD-10-CM | POA: Insufficient documentation

## 2012-01-16 DIAGNOSIS — M25579 Pain in unspecified ankle and joints of unspecified foot: Secondary | ICD-10-CM | POA: Insufficient documentation

## 2012-01-16 DIAGNOSIS — M109 Gout, unspecified: Secondary | ICD-10-CM | POA: Insufficient documentation

## 2012-01-16 DIAGNOSIS — M79609 Pain in unspecified limb: Secondary | ICD-10-CM | POA: Insufficient documentation

## 2012-01-16 DIAGNOSIS — M129 Arthropathy, unspecified: Secondary | ICD-10-CM | POA: Insufficient documentation

## 2012-01-16 DIAGNOSIS — M7989 Other specified soft tissue disorders: Secondary | ICD-10-CM | POA: Insufficient documentation

## 2012-01-16 HISTORY — DX: Unspecified osteoarthritis, unspecified site: M19.90

## 2012-01-16 MED ORDER — PREDNISONE 20 MG PO TABS
60.0000 mg | ORAL_TABLET | Freq: Once | ORAL | Status: AC
  Administered 2012-01-16: 60 mg via ORAL
  Filled 2012-01-16: qty 3

## 2012-01-16 MED ORDER — PREDNISONE 50 MG PO TABS: 50.0000 mg | ORAL_TABLET | Freq: Every day | ORAL | Status: DC

## 2012-01-16 MED ORDER — PREDNISONE 50 MG PO TABS: 50.0000 mg | ORAL_TABLET | Freq: Once | ORAL | Status: DC

## 2012-01-16 MED ORDER — INDOMETHACIN 25 MG PO CAPS
25.0000 mg | ORAL_CAPSULE | Freq: Once | ORAL | Status: AC
  Administered 2012-01-16: 25 mg via ORAL
  Filled 2012-01-16: qty 1

## 2012-01-16 MED ORDER — INDOMETHACIN 25 MG PO CAPS: 25.0000 mg | ORAL_CAPSULE | Freq: Three times a day (TID) | ORAL | Status: AC | PRN

## 2012-01-16 MED ORDER — PERCOCET 5-325 MG PO TABS: 1.0000 | ORAL_TABLET | Freq: Four times a day (QID) | ORAL | Status: AC | PRN

## 2012-01-16 NOTE — ED Provider Notes (Signed)
History     CSN: [id number]  Arrival date & time 01/16/12  1019   First MD Initiated Contact with Patient 01/16/12 1026      Chief Complaint  Patient presents with  . Toe Pain    2 months hx of increased swelling and l/toe pain. Pain unresponsive to mortin    (Consider location/radiation/quality/duration/timing/severity/associated sxs/prior treatment) HPI Comments: Patient reports pain in her left great toe that began 5 days ago.  States the pain is sharp, with radiation up to her knee, and is consistent with her previous gout flares.  States she usually takes ibuprofen and hydrocodone but has run out of her medications.  States this pain is in no way different from her previous gout flares - which has had had in bilateral 1st MTP joints.  Denies fevers or injury to the toe.    Patient is a 66 y.o. female presenting with toe pain. The history is provided by the patient.  Toe Pain    Past Medical History  Diagnosis Date  . Hypertension   . Arthritis     Past Surgical History  Procedure Date  . 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 Appendectomy   . Tonsillectomy   . Joint replacement     History reviewed. No pertinent family history.  History  Substance Use Topics  . Smoking status: Not on file  . Smokeless tobacco: Not on file  . Alcohol Use: No    OB History    Grav Para Term Preterm Abortions TAB SAB Ect Mult Living                  Review of Systems  All other systems reviewed and are negative.    Allergies  Food allergy formula  Home Medications   Current Outpatient Rx  Name Route Sig Dispense Refill  . HYDROCODONE-ACETAMINOPHEN 5-325 MG PO TABS Oral Take 1 tablet by mouth every 6 (six) hours as needed. For pain finished on 12/14/11    . IBUPROFEN 200 MG PO TABS Oral Take 800 mg by mouth every 6 (six) hours as needed. For pain      BP 173/92  Pulse 90  Temp(Src) 98.2 F (36.8 C) (Oral)  Resp 18  SpO2 100%  Physical Exam  Nursing note  and vitals reviewed. Constitutional: She is oriented to person, place, and time. She appears well-developed and well-nourished.  HENT:  Head: Normocephalic and atraumatic.  Neck: Neck supple.  Cardiovascular: Normal rate, regular rhythm and normal heart sounds.   Pulmonary/Chest: Effort normal and breath sounds normal. No respiratory distress. She has no wheezes. She has no rales.  Musculoskeletal:       Left foot: She exhibits tenderness and swelling. She exhibits no crepitus.       Feet:       Dorsalis pedis and posterior tibialis pulses intact.  Pt moves all toes.    Neurological: She is alert and oriented to person, place, and time.    ED Course  Procedures (including critical care time)  Labs Reviewed - No data to display No results found.  Patient also seen and examined by Dr. Preston Fleeting   1. Gout attack       MDM  Patient with history of gout presents to the emergency department with acute gout flare in her left great toe.  Patient discharged home with medications, PCP follow up.  Patient verbalizes understanding and agrees with plan. Marland Kitchen  Rise Patience, [place] 01/16/12 1544

## 2012-01-16 NOTE — ED Notes (Signed)
L/first toe swollen. Pt able to stand with pain. Stated it feels hot

## 2012-01-16 NOTE — ED Provider Notes (Signed)
66 year old female comes in with pain and swelling of the left breast patch in the right first toe about 6 weeks ago exam she has erythema and warmth and swelling of the first MTP joint on the left foot very typical of gout. She will be treated with indomethacin, Percocet, and prednisone. I held a discussion with her about the possible benefits of uric acid lowering agents. She has discussed this with her PCP.  Dione Booze, MD 01/16/12 1056

## 2012-01-17 NOTE — ED Provider Notes (Signed)
Medical screening examination/treatment/procedure(s) were conducted as a shared visit with non-physician practitioner(s) and myself.  I personally evaluated the patient during the encounter   Dione Booze, MD 01/17/12 [phone]

## 2012-03-08 ENCOUNTER — Encounter (HOSPITAL_COMMUNITY): Payer: Self-pay | Admitting: Emergency Medicine

## 2012-03-08 ENCOUNTER — Emergency Department (HOSPITAL_COMMUNITY)
Admission: EM | Admit: 2012-03-08 | Discharge: 2012-03-08 | Disposition: A | Discharge status: Home / Self Care | Payer: Medicare Other | Attending: Emergency Medicine | Admitting: Emergency Medicine

## 2012-03-08 DIAGNOSIS — I10 Essential (primary) hypertension: Secondary | ICD-10-CM | POA: Insufficient documentation

## 2012-03-08 DIAGNOSIS — R51 Headache: Secondary | ICD-10-CM

## 2012-03-08 LAB — CBC
HCT: 41 % (ref 36.0–46.0)
Hemoglobin: 13.5 g/dL (ref 12.0–15.0)
MCH: 27.2 pg (ref 26.0–34.0)
MCHC: 32.9 g/dL (ref 30.0–36.0)
MCV: 82.7 fL (ref 78.0–100.0)
Platelets: 259 10*3/uL (ref 150–400)
RBC: 4.96 MIL/uL (ref 3.87–5.11)
RDW: 14.3 % (ref 11.5–15.5)
WBC: 13.3 10*3/uL — ABNORMAL HIGH (ref 4.0–10.5)

## 2012-03-08 LAB — COMPREHENSIVE METABOLIC PANEL
ALT: 8 U/L (ref 0–35)
AST: 16 U/L (ref 0–37)
Albumin: 3.7 g/dL (ref 3.5–5.2)
Alkaline Phosphatase: 107 U/L (ref 39–117)
BUN: 10 mg/dL (ref 6–23)
CO2: 29 mEq/L (ref 19–32)
Calcium: 9.9 mg/dL (ref 8.4–10.5)
Chloride: 101 mEq/L (ref 96–112)
Creatinine, Ser: 0.79 mg/dL (ref 0.50–1.10)
GFR calc Af Amer: >90 mL/min (ref >90)
GFR calc non Af Amer: 85 mL/min — ABNORMAL LOW (ref >90)
Glucose, Bld: 102 mg/dL — ABNORMAL HIGH (ref 70–99)
Potassium: 3.4 mEq/L — ABNORMAL LOW (ref 3.5–5.1)
Sodium: 141 mEq/L (ref 135–145)
Total Bilirubin: 0.3 mg/dL (ref 0.3–1.2)
Total Protein: 8 g/dL (ref 6.0–8.3)

## 2012-03-08 LAB — DIFFERENTIAL
Basophils Absolute: 0 10*3/uL (ref 0.0–0.1)
Basophils Relative: 0 % (ref 0–1)
Eosinophils Absolute: 0.3 10*3/uL (ref 0.0–0.7)
Eosinophils Relative: 2 % (ref 0–5)
Lymphocytes Relative: 46 % (ref 12–46)
Lymphs Abs: 6 10*3/uL — ABNORMAL HIGH (ref 0.7–4.0)
Monocytes Absolute: 1.1 10*3/uL — ABNORMAL HIGH (ref 0.1–1.0)
Monocytes Relative: 8 % (ref 3–12)
Neutro Abs: 5.9 10*3/uL (ref 1.7–7.7)
Neutrophils Relative %: 44 % (ref 43–77)

## 2012-03-08 LAB — SEDIMENTATION RATE: Sed Rate: 35 mm/hr — ABNORMAL HIGH (ref 0–22)

## 2012-03-08 MED ORDER — ACETAMINOPHEN 500 MG PO TABS: 500.0000 mg | ORAL_TABLET | Freq: Four times a day (QID) | ORAL | Status: AC | PRN

## 2012-03-08 MED ORDER — DIPHENHYDRAMINE HCL 50 MG/ML IJ SOLN
25.0000 mg | Freq: Once | INTRAMUSCULAR | Status: AC
  Administered 2012-03-08: 25 mg via INTRAVENOUS
  Filled 2012-03-08: qty 1

## 2012-03-08 MED ORDER — KETOROLAC TROMETHAMINE 30 MG/ML IJ SOLN
30.0000 mg | Freq: Once | INTRAMUSCULAR | Status: AC
  Administered 2012-03-08: 30 mg via INTRAVENOUS
  Filled 2012-03-08: qty 1

## 2012-03-08 MED ORDER — SODIUM CHLORIDE 0.9 % IV BOLUS (SEPSIS)
1000.0000 mL | Freq: Once | INTRAVENOUS | Status: AC
  Administered 2012-03-08: 1000 mL via INTRAVENOUS

## 2012-03-08 MED ORDER — PREDNISONE 20 MG PO TABS
60.0000 mg | ORAL_TABLET | Freq: Once | ORAL | Status: AC
  Administered 2012-03-08: 60 mg via ORAL
  Filled 2012-03-08: qty 3

## 2012-03-08 MED ORDER — METOCLOPRAMIDE HCL 5 MG/ML IJ SOLN
10.0000 mg | Freq: Once | INTRAMUSCULAR | Status: AC
  Administered 2012-03-08: 10 mg via INTRAVENOUS
  Filled 2012-03-08: qty 2

## 2012-03-08 NOTE — Discharge Instructions (Signed)
As discussed, it is very important that you follow up with Dr. Parke Simmers and have an MRI.  Although your headache is improved, there is still some suspicion for temporal arteritis.  If you develop any new, or concerning changes in your condition, please return to the emergency department immediately.

## 2012-03-08 NOTE — ED Notes (Signed)
Pt states she began to get a headache that has progressively gotten worse throughout the night  Pt describes the pain as a stabbing pain in the left side of her head

## 2012-03-08 NOTE — ED Provider Notes (Signed)
History     CSN: [id number]  Arrival date & time 03/08/12  4098   First MD Initiated Contact with Patient 03/08/12 [phone]      Chief Complaint  Patient presents with  . Headache    (Consider location/radiation/quality/duration/timing/severity/associated sxs/prior treatment) HPI The patient presents with headache.  Discussed with the pain began insidiously yesterday, since onset has been persistent.  Is throbbing, sharp, left sided/temporal.  The pain is not relieved with ibuprofen.  The patient denies any neck pain, visual changes, confusion, disorientation, vomiting, or any other focal complaints.  Past Medical History  Diagnosis Date  . Hypertension   . Arthritis     Past Surgical History  Procedure Date  . 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 Appendectomy   . Tonsillectomy   . Joint replacement     History reviewed. No pertinent family history.  History  Substance Use Topics  . Smoking status: Not on file  . Smokeless tobacco: Not on file  . Alcohol Use: No    OB History    Grav Para Term Preterm Abortions TAB SAB Ect Mult Living                  Review of Systems  Constitutional:       HPI  HENT:       HPI otherwise negative  Eyes: Negative.   Respiratory:       HPI, otherwise negative  Cardiovascular:       HPI, otherwise nmegative  Gastrointestinal: Negative for vomiting.  Genitourinary:       HPI, otherwise negative  Musculoskeletal:       HPI, otherwise negative  Skin: Negative.   Neurological: Negative for syncope.    Allergies  Food allergy formula  Home Medications   Current Outpatient Rx  Name Route Sig Dispense Refill  . IBUPROFEN 200 MG PO TABS Oral Take 800 mg by mouth every 6 (six) hours as needed. For pain    . INDOMETHACIN 25 MG PO CAPS Oral Take 1 capsule (25 mg total) by mouth 3 (three) times daily as needed. 30 capsule 0  . HYDROCODONE-ACETAMINOPHEN 5-325 MG PO TABS Oral Take 1 tablet by mouth every 6 (six) hours as  needed. For pain finished on 12/14/11    . PREDNISONE 50 MG PO TABS Oral Take 1 tablet (50 mg total) by mouth daily. 7 tablet 0    BP 124/51  Pulse 97  Temp(Src) 98.5 F (36.9 C) (Oral)  Resp 18  SpO2 96%  Physical Exam  Nursing note and vitals reviewed. Constitutional: She is oriented to person, place, and time. She appears well-developed and well-nourished. No distress.  HENT:  Head: Normocephalic and atraumatic.  Eyes: Conjunctivae and EOM are normal. Pupils are equal, round, and reactive to light. Right eye exhibits no discharge. Left eye exhibits no discharge. No scleral icterus.  Cardiovascular: Normal rate and regular rhythm.   Pulmonary/Chest: Effort normal and breath sounds normal. No stridor. No respiratory distress.  Abdominal: She exhibits no distension.  Musculoskeletal: She exhibits no edema.  Neurological: She is alert and oriented to person, place, and time. She displays normal reflexes. No cranial nerve deficit. She exhibits normal muscle tone. Coordination normal.  Skin: Skin is warm and dry.  Psychiatric: She has a normal mood and affect.    ED Course  Procedures (including critical care time)  Labs Reviewed  CBC - Abnormal; Notable for the following:    WBC 13.3 (*)  All other components within normal limits  DIFFERENTIAL  COMPREHENSIVE METABOLIC PANEL  SEDIMENTATION RATE   No results found.   No diagnosis found.    MDM  This 66 year old female who was previously well presents with left temporal headache.  Although the patient denies any visual complaints, and this elderly female with new headaches there is some suspicion of temporal arteritis.  The absence of neural deficits, visual complaints, any notable physical exam findings is reassuring.   The patient had substantial reduction in her complaints with ED interventions.  The patient's labs were largely unremarkable, but her erythrocyte sedimentation rate was elevated.  Given this finding, I  discussed the patient's case with our neurologist, Dr. Lyman Speller.  We agreed the next appropriate step for the patient's evaluation would be MRI of her brain.  I discussed with the patient who was adamant that she had to leave for previously scheduled engagement I discussed at length the need for continued evaluation, preferably within the next 24 hours.  The patient acknowledged understanding of this, stated she would be able to obtain an outpatient MRI, which was prescribed for her.  She will also follow up with her primary care physician via telephone today to ensure appropriate continued management.  The patient was discharged in stable condition with the aforementioned homecare instructions.  Gerhard Munch, MD 03/08/12 1124

## 2012-03-09 LAB — PATHOLOGIST SMEAR REVIEW

## 2013-11-12 ENCOUNTER — Encounter (HOSPITAL_COMMUNITY): Payer: Self-pay | Admitting: Emergency Medicine

## 2013-11-12 ENCOUNTER — Emergency Department (HOSPITAL_COMMUNITY)
Admission: EM | Admit: 2013-11-12 | Discharge: 2013-11-12 | Disposition: A | Discharge status: Home / Self Care | Payer: Medicare Other | Attending: Emergency Medicine | Admitting: Emergency Medicine

## 2013-11-12 DIAGNOSIS — L723 Sebaceous cyst: Secondary | ICD-10-CM | POA: Insufficient documentation

## 2013-11-12 DIAGNOSIS — M109 Gout, unspecified: Secondary | ICD-10-CM | POA: Insufficient documentation

## 2013-11-12 DIAGNOSIS — M79609 Pain in unspecified limb: Secondary | ICD-10-CM | POA: Insufficient documentation

## 2013-11-12 DIAGNOSIS — M129 Arthropathy, unspecified: Secondary | ICD-10-CM | POA: Insufficient documentation

## 2013-11-12 DIAGNOSIS — I10 Essential (primary) hypertension: Secondary | ICD-10-CM | POA: Insufficient documentation

## 2013-11-12 DIAGNOSIS — Z79899 Other long term (current) drug therapy: Secondary | ICD-10-CM | POA: Insufficient documentation

## 2013-11-12 LAB — CBC WITH DIFFERENTIAL/PLATELET
Basophils Absolute: 0 10*3/uL (ref 0.0–0.1)
Basophils Relative: 0 % (ref 0–1)
Eosinophils Absolute: 0.3 10*3/uL (ref 0.0–0.7)
Eosinophils Relative: 4 % (ref 0–5)
HCT: 39 % (ref 36.0–46.0)
Hemoglobin: 13 g/dL (ref 12.0–15.0)
Lymphocytes Relative: 46 % (ref 12–46)
Lymphs Abs: 4.4 10*3/uL — ABNORMAL HIGH (ref 0.7–4.0)
MCH: 28.1 pg (ref 26.0–34.0)
MCHC: 33.3 g/dL (ref 30.0–36.0)
MCV: 84.2 fL (ref 78.0–100.0)
Monocytes Absolute: 0.7 10*3/uL (ref 0.1–1.0)
Monocytes Relative: 7 % (ref 3–12)
Neutro Abs: 4.1 10*3/uL (ref 1.7–7.7)
Neutrophils Relative %: 43 % (ref 43–77)
Platelets: 318 10*3/uL (ref 150–400)
RBC: 4.63 MIL/uL (ref 3.87–5.11)
RDW: 13.2 % (ref 11.5–15.5)
WBC: 9.5 10*3/uL (ref 4.0–10.5)

## 2013-11-12 LAB — COMPREHENSIVE METABOLIC PANEL
ALT: 10 U/L (ref 0–35)
AST: 14 U/L (ref 0–37)
Albumin: 3.7 g/dL (ref 3.5–5.2)
Alkaline Phosphatase: 111 U/L (ref 39–117)
BUN: 9 mg/dL (ref 6–23)
CO2: 29 mEq/L (ref 19–32)
Calcium: 10.1 mg/dL (ref 8.4–10.5)
Chloride: 97 mEq/L (ref 96–112)
Creatinine, Ser: 0.77 mg/dL (ref 0.50–1.10)
GFR calc Af Amer: >90 mL/min (ref >90)
GFR calc non Af Amer: 84 mL/min — ABNORMAL LOW (ref >90)
Glucose, Bld: 96 mg/dL (ref 70–99)
Potassium: 3.4 mEq/L — ABNORMAL LOW (ref 3.5–5.1)
Sodium: 138 mEq/L (ref 135–145)
Total Bilirubin: 0.3 mg/dL (ref 0.3–1.2)
Total Protein: 8.1 g/dL (ref 6.0–8.3)

## 2013-11-12 LAB — RHEUMATOID FACTOR: Rhuematoid fact SerPl-aCnc: 48 IU/mL — ABNORMAL HIGH (ref <=14)

## 2013-11-12 LAB — SEDIMENTATION RATE: Sed Rate: 39 mm/hr — ABNORMAL HIGH (ref 0–22)

## 2013-11-12 LAB — URIC ACID: Uric Acid, Serum: 7.9 mg/dL — ABNORMAL HIGH (ref 2.4–7.0)

## 2013-11-12 LAB — C-REACTIVE PROTEIN: CRP: 0.8 mg/dL — ABNORMAL HIGH (ref <0.60)

## 2013-11-12 MED ORDER — IBUPROFEN 800 MG PO TABS: 800.0000 mg | ORAL_TABLET | Freq: Three times a day (TID) | ORAL | Status: DC

## 2013-11-12 MED ORDER — HYDROCODONE-ACETAMINOPHEN 5-325 MG PO TABS: 2.0000 | ORAL_TABLET | ORAL | Status: DC | PRN

## 2013-11-12 MED ORDER — COLCHICINE 0.6 MG PO TABS: ORAL_TABLET | ORAL | Status: DC

## 2013-11-12 NOTE — ED Notes (Signed)
Pt has knee pain and swelling to lt know states that she is suppose to have knee replacement but does not have the money for it. Pt also has pain to her big toes states that it is gout.

## 2013-11-12 NOTE — ED Provider Notes (Addendum)
CSN: [id number]     Arrival date & time 11/12/13  0840 History   First MD Initiated Contact with Patient 11/12/13 0857     Chief Complaint  Patient presents with  . Knee Pain  . Toe Pain    HPI  Pt has knee pain and swelling to lt know states that she is suppose to have knee replacement but does not have the money for it. Pt also has pain to her big toes states that it is gout.    Patient has been taking 800 mg of ibuprofen twice a day for last several days with minimal to no relief.    Patient has history of gout in her toes but the incidence of swelling and redness in her fingers on her hands is something that is new.  Past Medical History  Diagnosis Date  . Hypertension   . Arthritis    Past Surgical History  Procedure Laterality Date  . 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 Appendectomy    . Tonsillectomy    . Joint replacement     No family history on file. History  Substance Use Topics  . Smoking status: Not on file  . Smokeless tobacco: Not on file  . Alcohol Use: No   OB History   Grav Para Term Preterm Abortions TAB SAB Ect Mult Living                 Review of Systems All other systems reviewed and are negative Allergies  Food allergy formula  Home Medications   Current Outpatient Rx  Name  Route  Sig  Dispense  Refill  . HYDROcodone-acetaminophen (NORCO) 5-325 MG per tablet   Oral   Take 1 tablet by mouth every 6 (six) hours as needed for moderate pain. For pain finished on 12/14/11         . Olmesartan-Amlodipine-HCTZ (TRIBENZOR) 20-5-12.5 MG TABS   Oral   Take 1 tablet by mouth daily.         . rosuvastatin (CRESTOR) 10 MG tablet   Oral   Take 10 mg by mouth daily.         . colchicine 0.6 MG tablet      Take 2 tablets daily for one week.  Then take 1 tablet daily   30 tablet   0   . HYDROcodone-acetaminophen (NORCO/VICODIN) 5-325 MG per tablet   Oral   Take 2 tablets by mouth every 4 (four) hours as needed.   10 tablet    0   . ibuprofen (ADVIL,MOTRIN) 800 MG tablet   Oral   Take 1 tablet (800 mg total) by mouth 3 (three) times daily.   21 tablet   0    BP 143/89  Pulse 100  Temp(Src) 99 F (37.2 C) (Oral)  Resp 16  SpO2 95% Physical Exam  Nursing note and vitals reviewed. Constitutional: She is oriented to person, place, and time. She appears well-developed and well-nourished. No distress.  HENT:  Head: Normocephalic and atraumatic.  Eyes: Pupils are equal, round, and reactive to light.  Neck: Normal range of motion.  Cardiovascular: Normal rate and intact distal pulses.   Pulmonary/Chest: No respiratory distress.  Abdominal: Normal appearance. She exhibits no distension.  Musculoskeletal: Normal range of motion.       Feet:  Swelling redness and tenderness to digits of left and right hands  Neurological: She is alert and oriented to person, place, and time. No cranial  nerve deficit.  Skin: Skin is warm and dry. No rash noted.  Psychiatric: She has a normal mood and affect. Her behavior is normal.    ED Course  INCISION AND DRAINAGE Date/Time: 11/12/2013 11:26 AM Performed by: Nelia Shi Authorized by: Nelia Shi Consent: Verbal consent obtained. Risks and benefits: risks, benefits and alternatives were discussed Consent given by: patient Patient understanding: patient states understanding of the procedure being performed Patient consent: the patient's understanding of the procedure matches consent given Procedure consent: procedure consent matches procedure scheduled Patient identity confirmed: verbally with patient Time out: Immediately prior to procedure a "time out" was called to verify the correct patient, procedure, equipment, support staff and site/side marked as required. Type: abscess Body area: trunk Location details: back Anesthesia: local infiltration Local anesthetic: lidocaine 2% with epinephrine Anesthetic total: 2 ml Patient sedated: no Scalpel size:  11 Incision type: single straight Complexity: simple Drainage: purulent Drainage amount: copious Wound treatment: wound left open Packing material: none Patient tolerance: Patient tolerated the procedure well with no immediate complications. Comments: Material consistent with a sebaceous cyst.   (including critical care time) Labs Review Labs Reviewed  COMPREHENSIVE METABOLIC PANEL - Abnormal; Notable for the following:    Potassium 3.4 (*)    GFR calc non Af Amer 84 (*)    All other components within normal limits  CBC WITH DIFFERENTIAL - Abnormal; Notable for the following:    Lymphs Abs 4.4 (*)    All other components within normal limits  URIC ACID - Abnormal; Notable for the following:    Uric Acid, Serum 7.9 (*)    All other components within normal limits  SEDIMENTATION RATE - Abnormal; Notable for the following:    Sed Rate 39 (*)    All other components within normal limits  RHEUMATOID FACTOR  C-REACTIVE PROTEIN        MDM   1. Gouty arthritis   2. Sebaceous cyst        Nelia Shi, MD 11/12/13 1117  At time of discharge patient complain running to about a small cyst on her back area that's been giving her problems over the last few weeks to months.  States that since beginning larger.  On examination there is a small fluctuant cystic area just above the bra.  There is no surrounding erythema or heat.  Plan at this time is simple incision and drainage.    Nelia Shi, MD 11/12/13 [phone]

## 2014-02-14 ENCOUNTER — Encounter (HOSPITAL_COMMUNITY): Payer: Self-pay | Admitting: Emergency Medicine

## 2014-02-14 ENCOUNTER — Emergency Department (HOSPITAL_COMMUNITY)
Admission: EM | Admit: 2014-02-14 | Discharge: 2014-02-14 | Disposition: A | Discharge status: Home / Self Care | Payer: Medicare Other | Attending: Emergency Medicine | Admitting: Emergency Medicine

## 2014-02-14 DIAGNOSIS — IMO0002 Reserved for concepts with insufficient information to code with codable children: Secondary | ICD-10-CM | POA: Insufficient documentation

## 2014-02-14 DIAGNOSIS — M549 Dorsalgia, unspecified: Secondary | ICD-10-CM

## 2014-02-14 DIAGNOSIS — F17200 Nicotine dependence, unspecified, uncomplicated: Secondary | ICD-10-CM | POA: Insufficient documentation

## 2014-02-14 DIAGNOSIS — M129 Arthropathy, unspecified: Secondary | ICD-10-CM | POA: Insufficient documentation

## 2014-02-14 DIAGNOSIS — Z79899 Other long term (current) drug therapy: Secondary | ICD-10-CM | POA: Insufficient documentation

## 2014-02-14 DIAGNOSIS — Z791 Long term (current) use of non-steroidal anti-inflammatories (NSAID): Secondary | ICD-10-CM | POA: Insufficient documentation

## 2014-02-14 DIAGNOSIS — M4800 Spinal stenosis, site unspecified: Secondary | ICD-10-CM | POA: Insufficient documentation

## 2014-02-14 MED ORDER — METHYLPREDNISOLONE 4 MG PO KIT: PACK | ORAL | Status: DC

## 2014-02-14 MED ORDER — OXYCODONE-ACETAMINOPHEN 5-325 MG PO TABS: 2.0000 | ORAL_TABLET | ORAL | Status: DC | PRN

## 2014-02-14 MED ORDER — DEXAMETHASONE SODIUM PHOSPHATE 10 MG/ML IJ SOLN
10.0000 mg | Freq: Once | INTRAMUSCULAR | Status: AC
  Administered 2014-02-14: 10 mg via INTRAMUSCULAR
  Filled 2014-02-14: qty 1

## 2014-02-14 MED ORDER — NAPROXEN 500 MG PO TABS: 500.0000 mg | ORAL_TABLET | Freq: Two times a day (BID) | ORAL | Status: DC

## 2014-02-14 MED ORDER — PROMETHAZINE HCL 25 MG/ML IJ SOLN
25.0000 mg | Freq: Once | INTRAMUSCULAR | Status: AC
  Administered 2014-02-14: 25 mg via INTRAMUSCULAR
  Filled 2014-02-14: qty 1

## 2014-02-14 MED ORDER — HYDROCODONE-ACETAMINOPHEN 10-325 MG PO TABS
1.0000 | ORAL_TABLET | Freq: Four times a day (QID) | ORAL | Status: DC | PRN
Start: 2014-02-14 — End: 2014-03-19

## 2014-02-14 MED ORDER — MORPHINE SULFATE 10 MG/ML IJ SOLN
10.0000 mg | Freq: Once | INTRAMUSCULAR | Status: AC
  Administered 2014-02-14: 10 mg via INTRAMUSCULAR
  Filled 2014-02-14: qty 1

## 2014-02-14 NOTE — Discharge Instructions (Signed)
Back Pain, Adult Low back pain is very common. About 1 in 5 people have back pain.The cause of low back pain is rarely dangerous. The pain often gets better over time.About half of people with a sudden onset of back pain feel better in just 2 weeks. About 8 in 10 people feel better by 6 weeks.  CAUSES Some common causes of back pain include:  Strain of the muscles or ligaments supporting the spine.  Wear and tear (degeneration) of the spinal discs.  Arthritis.  Direct injury to the back. DIAGNOSIS Most of the time, the direct cause of low back pain is not known.However, back pain can be treated effectively even when the exact cause of the pain is unknown.Answering your caregiver's questions about your overall health and symptoms is one of the most accurate ways to make sure the cause of your pain is not dangerous. If your caregiver needs more information, he or she may order lab work or imaging tests (X-rays or MRIs).However, even if imaging tests show changes in your back, this usually does not require surgery. HOME CARE INSTRUCTIONS For many people, back pain returns.Since low back pain is rarely dangerous, it is often a condition that people can learn to [facility] their own.   Remain active. It is stressful on the back to sit or stand in one place. Do not sit, drive, or stand in one place for more than 30 minutes at a time. Take short walks on level surfaces as soon as pain allows.Try to increase the length of time you walk each day.  Do not stay in bed.Resting more than 1 or 2 days can delay your recovery.  Do not avoid exercise or work.Your body is made to move.It is not dangerous to be active, even though your back may hurt.Your back will likely heal faster if you return to being active before your pain is gone.  Pay attention to your body when you bend and lift. Many people have less discomfortwhen lifting if they bend their knees, keep the load close to their bodies,and  avoid twisting. Often, the most comfortable positions are those that put less stress on your recovering back.  Find a comfortable position to sleep. Use a firm mattress and lie on your side with your knees slightly bent. If you lie on your back, put a pillow under your knees.  Only take over-the-counter or prescription medicines as directed by your caregiver. Over-the-counter medicines to reduce pain and inflammation are often the most helpful.Your caregiver may prescribe muscle relaxant drugs.These medicines help dull your pain so you can more quickly return to your normal activities and healthy exercise.  Put ice on the injured area.  Put ice in a plastic bag.  Place a towel between your skin and the bag.  Leave the ice on for 15-20 minutes, 03-04 times a day for the first 2 to 3 days. After that, ice and heat may be alternated to reduce pain and spasms.  Ask your caregiver about trying back exercises and gentle massage. This may be of some benefit.  Avoid feeling anxious or stressed.Stress increases muscle tension and can worsen back pain.It is important to recognize when you are anxious or stressed and learn ways to manage it.Exercise is a great option. SEEK MEDICAL CARE IF:  You have pain that is not relieved with rest or medicine.  You have pain that does not improve in 1 week.  You have new symptoms.  You are generally not feeling well. SEEK  IMMEDIATE MEDICAL CARE IF:   You have pain that radiates from your back into your legs.  You develop new bowel or bladder control problems.  You have unusual weakness or numbness in your arms or legs.  You develop nausea or vomiting.  You develop abdominal pain.  You feel faint. Document Released: 11/23/2005 Document Revised: 05/24/2012 Document Reviewed: 04/13/2011 [facility] Patient Information 2014 [place], [place].  Spinal Stenosis Spinal stenosis is an abnormal narrowing of the canals of your spine (vertebrae). CAUSES    Spinal stenosis is caused by areas of bone pushing into the central canals of your vertebrae. This condition can be present at birth (congenital). It also may be caused by arthritic deterioration of your vertebrae (spinal degeneration).  SYMPTOMS   Pain that is generally worse with activities, particularly standing and walking.  Numbness, tingling, hot or cold sensations, weakness, or weariness in your legs.  Frequent episodes of falling.  A foot-slapping gait that leads to muscle weakness. DIAGNOSIS  Spinal stenosis is diagnosed with the use of magnetic resonance imaging (MRI) or computed tomography (CT). TREATMENT  Initial therapy for spinal stenosis focuses on the management of the pain and other symptoms associated with the condition. These therapies include:  Practicing postural changes to lessen pressure on your nerves.  Exercises to strengthen the core of your body.  Loss of excess body weight.  The use of nonsteroidal anti-inflammatory medications to reduce swelling and inflammation in your nerves. When therapies to manage pain are not successful, surgery to treat spinal stenosis may be recommended. This surgery involves removing excess bone, which puts pressure on your nerve roots. During this surgery (laminectomy), the posterior boney arch (lamina) and excess bone around the facet joints are removed. Document Released: 02/13/2004 Document Revised: 03/20/2013 Document Reviewed: 03/03/2013 [facility] Patient Information 2014 [place].

## 2014-02-14 NOTE — ED Notes (Signed)
Pt alert, arrives from home, c/o right hip pain, onset was a month ago, denies trauma or injury, ambulates triage, seen PCP, states pain medication no relief

## 2014-02-14 NOTE — ED Provider Notes (Addendum)
CSN: [id number]     Arrival date & time 02/14/14  0240 History   First MD Initiated Contact with Patient 02/14/14 [phone]     Chief Complaint  Patient presents with  . Hip Pain     HPI  Patient presents with back pain. She's had back pain for a few months as been increasing. She saw Dr. Berenice Primas orthopedic surgery and MRI was scheduled and performed. She does not have the results of this. She states "I didn't have the co-pay go see him today it was hurting so bad I came here". She denies numbness weakness tingling to her extremities. Pain is primarily right-sided radiates to the buttock. No other trauma, radiation. No rash or vesicles on the skin. No bowel or bladder changes with incontinence or retention. No fever.  Past Medical History  Diagnosis Date  . Hypertension   . Arthritis    Past Surgical History  Procedure Laterality Date  . 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 Appendectomy    . Tonsillectomy    . Joint replacement     No family history on file. History  Substance Use Topics  . Smoking status: Current Some Day Smoker    Types: Cigarettes  . Smokeless tobacco: Not on file  . Alcohol Use: No   OB History   Grav Para Term Preterm Abortions TAB SAB Ect Mult Living                 Review of Systems  Constitutional: Negative for fever, chills, diaphoresis, appetite change and fatigue.  HENT: Negative for mouth sores, sore throat and trouble swallowing.   Eyes: Negative for visual disturbance.  Respiratory: Negative for cough, chest tightness, shortness of breath and wheezing.   Cardiovascular: Negative for chest pain.  Gastrointestinal: Negative for nausea, vomiting, abdominal pain, diarrhea and abdominal distention.  Endocrine: Negative for polydipsia, polyphagia and polyuria.  Genitourinary: Negative for dysuria, frequency and hematuria.  Musculoskeletal: Positive for back pain and gait problem.  Skin: Negative for color change, pallor and rash.   Neurological: Negative for dizziness, syncope, light-headedness and headaches.  Hematological: Does not bruise/bleed easily.  Psychiatric/Behavioral: Negative for behavioral problems and confusion.      Allergies  Food allergy formula  Home Medications   Current Outpatient Rx  Name  Route  Sig  Dispense  Refill  . colchicine 0.6 MG tablet      Take 2 tablets daily for one week.  Then take 1 tablet daily   30 tablet   0   . Olmesartan-Amlodipine-HCTZ (TRIBENZOR) 20-5-12.5 MG TABS   Oral   Take 1 tablet by mouth daily.         Marland Kitchen oxyCODONE-acetaminophen (PERCOCET/ROXICET) 5-325 MG per tablet   Oral   Take 2 tablets by mouth every 4 (four) hours as needed for severe pain.         Marland Kitchen HYDROcodone-acetaminophen (NORCO) 10-325 MG per tablet   Oral   Take 1 tablet by mouth every 6 (six) hours as needed.   30 tablet   0   . methylPREDNISolone (MEDROL DOSEPAK) 4 MG tablet      6 po on day 1, decrease by 1 tab per day   21 tablet   0   . naproxen (NAPROSYN) 500 MG tablet   Oral   Take 1 tablet (500 mg total) by mouth 2 (two) times daily.   30 tablet   0   . oxyCODONE-acetaminophen (PERCOCET/ROXICET) 5-325 MG  per tablet   Oral   Take 2 tablets by mouth every 4 (four) hours as needed.   20 tablet   0    BP 127/90  Pulse 86  Temp(Src) 98 F (36.7 C) (Oral)  Resp 18  Wt 255 lb (115.667 kg)  SpO2 95% Physical Exam  Constitutional: She is oriented to person, place, and time. She appears well-developed and well-nourished. No distress.  HENT:  Head: Normocephalic.  Eyes: Conjunctivae are normal. Pupils are equal, round, and reactive to light. No scleral icterus.  Neck: Normal range of motion. Neck supple. No thyromegaly present.  Cardiovascular: Normal rate and regular rhythm.  Exam reveals no gallop and no friction rub.   No murmur heard. Pulmonary/Chest: Effort normal and breath sounds normal. No respiratory distress. She has no wheezes. She has no rales.   Abdominal: Soft. Bowel sounds are normal. She exhibits no distension. There is no tenderness. There is no rebound.  Musculoskeletal: Normal range of motion.  Neurological: She is alert and oriented to person, place, and time.  Pain is not follow a regular pattern. She has negative straight leg exam. She has normal strength to flex extension at the hips knees and ankles. Downgoing Babinski's and symmetric 1+ reflexes.  Skin: Skin is warm and dry. No rash noted.  Psychiatric: She has a normal mood and affect. Her behavior is normal.    ED Course  Procedures (including critical care time) Labs Review Labs Reviewed - No data to display Imaging Review No results found.   EKG Interpretation None      MDM   Final diagnoses:  Back pain  Spinal stenosis    She is neurologically intact. Given some IM morphine 10 mg. Also 10 mg IM Decadron. I reviewed her CT findings with her from her orthopedist. It shows spinal stenosis, acquired and congenital. No acute disc herniation. I discussed with her the treatment this point would be sent to control pending discussion of her findings with her orthopedist regarding additional therapy including surgeries. She shows no signs or has any symptoms suggestive of acute neurological compromise. She is appropriate for outpatient treatment with appropriate precautions.    Tanna Furry, MD 02/14/14 [place], MD 02/15/14 1100

## 2014-03-19 ENCOUNTER — Emergency Department (HOSPITAL_COMMUNITY)
Admission: EM | Admit: 2014-03-19 | Discharge: 2014-03-19 | Disposition: A | Discharge status: Home / Self Care | Payer: Medicare Other | Attending: Emergency Medicine | Admitting: Emergency Medicine

## 2014-03-19 ENCOUNTER — Encounter (HOSPITAL_COMMUNITY): Payer: Self-pay | Admitting: Emergency Medicine

## 2014-03-19 DIAGNOSIS — M129 Arthropathy, unspecified: Secondary | ICD-10-CM | POA: Insufficient documentation

## 2014-03-19 DIAGNOSIS — Z79899 Other long term (current) drug therapy: Secondary | ICD-10-CM | POA: Insufficient documentation

## 2014-03-19 DIAGNOSIS — M545 Low back pain, unspecified: Secondary | ICD-10-CM | POA: Insufficient documentation

## 2014-03-19 DIAGNOSIS — G8929 Other chronic pain: Secondary | ICD-10-CM | POA: Insufficient documentation

## 2014-03-19 DIAGNOSIS — M549 Dorsalgia, unspecified: Secondary | ICD-10-CM

## 2014-03-19 DIAGNOSIS — I10 Essential (primary) hypertension: Secondary | ICD-10-CM | POA: Insufficient documentation

## 2014-03-19 DIAGNOSIS — IMO0002 Reserved for concepts with insufficient information to code with codable children: Secondary | ICD-10-CM | POA: Insufficient documentation

## 2014-03-19 DIAGNOSIS — F17200 Nicotine dependence, unspecified, uncomplicated: Secondary | ICD-10-CM | POA: Insufficient documentation

## 2014-03-19 MED ORDER — DEXAMETHASONE SODIUM PHOSPHATE 10 MG/ML IJ SOLN
10.0000 mg | Freq: Once | INTRAMUSCULAR | Status: AC
  Administered 2014-03-19: 10 mg via INTRAMUSCULAR
  Filled 2014-03-19: qty 1

## 2014-03-19 MED ORDER — NAPROXEN 500 MG PO TABS: 500.0000 mg | ORAL_TABLET | Freq: Two times a day (BID) | ORAL | Status: DC

## 2014-03-19 MED ORDER — OXYCODONE-ACETAMINOPHEN 7.5-325 MG PO TABS: 1.0000 | ORAL_TABLET | ORAL | Status: DC | PRN

## 2014-03-19 MED ORDER — HYDROMORPHONE HCL PF 1 MG/ML IJ SOLN
1.0000 mg | Freq: Once | INTRAMUSCULAR | Status: AC
  Administered 2014-03-19: 1 mg via INTRAMUSCULAR
  Filled 2014-03-19: qty 1

## 2014-03-19 NOTE — ED Notes (Signed)
MD at bedside. 

## 2014-03-19 NOTE — ED Notes (Signed)
Patient c/o right leg pain and states that she ran out of oxycodone 3 days ago and is now having increased pain.

## 2014-03-19 NOTE — ED Provider Notes (Signed)
CSN: [id number]     Arrival date & time 03/19/14  0754 History   First MD Initiated Contact with Patient 03/19/14 [phone]     Chief Complaint  Patient presents with  . Leg Pain  . Medication Refill     (Consider location/radiation/quality/duration/timing/severity/associated sxs/prior Treatment) HPI Ellen Henry is a 68 y.o. female who presents to emergency department complaining of back pain. Patient states she has a chronic back pain, followed by Dr. Berenice Primas, currently getting epidural injections series, states has had one, next one is due in 2 weeks. States the first one did not really help her. She states that Dr. Berenice Primas is prescribing her Percocet, but states she ran out. She states that medication was prescribed as needed, but she has been taking at daily. She reports pain in the lower back, radiates into her right thigh and left buttock. She denies any numbness or weakness in extremities. She's not losing her bowels or urine, there is no numbness of her perineum. There is no fever or chills. No abdominal pain or back pain otherwise. No new injuries. She states pain is similar to her regular pain. Pt requesting medication refill.   Past Medical History  Diagnosis Date  . Hypertension   . Arthritis    Past Surgical History  Procedure Laterality Date  . 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 Appendectomy    . Tonsillectomy    . Joint replacement    . Abdominal hysterectomy     History reviewed. No pertinent family history. History  Substance Use Topics  . Smoking status: Current Some Day Smoker    Types: Cigarettes  . Smokeless tobacco: Never Used  . Alcohol Use: No   OB History   Grav Para Term Preterm Abortions TAB SAB Ect Mult Living                 Review of Systems  Constitutional: Negative for fever and chills.  Respiratory: Negative for cough, chest tightness and shortness of breath.   Cardiovascular: Negative for chest pain, palpitations and leg swelling.   Gastrointestinal: Negative for nausea, vomiting, abdominal pain and diarrhea.  Genitourinary: Negative for dysuria, flank pain, vaginal bleeding, vaginal discharge, vaginal pain and pelvic pain.  Musculoskeletal: Positive for back pain. Negative for arthralgias, myalgias, neck pain and neck stiffness.  Skin: Negative for rash.  Neurological: Negative for dizziness, weakness, numbness and headaches.  All other systems reviewed and are negative.     Allergies  Food allergy formula  Home Medications   Current Outpatient Rx  Name  Route  Sig  Dispense  Refill  . colchicine 0.6 MG tablet      Take 2 tablets daily for one week.  Then take 1 tablet daily   30 tablet   0   . gabapentin (NEURONTIN) 600 MG tablet   Oral   Take 600 mg by mouth 3 (three) times daily.         . Lidocaine-Epi & Saline (EPIDURAL TRAY CO)   Intramuscular   Inject into the muscle. Dr. Arta Bruce- for pain blocker. Series of 3 shots. Had first last month.         . naproxen (NAPROSYN) 500 MG tablet   Oral   Take 1 tablet (500 mg total) by mouth 2 (two) times daily.   30 tablet   0   . Olmesartan-Amlodipine-HCTZ (TRIBENZOR) 20-5-12.5 MG TABS   Oral   Take 1 tablet by mouth daily.         Marland Kitchen  oxyCODONE-acetaminophen (PERCOCET/ROXICET) 5-325 MG per tablet   Oral   Take 2 tablets by mouth every 4 (four) hours as needed for severe pain.          BP 145/82  Pulse 88  Temp(Src) 98 F (36.7 C) (Oral)  Resp 20  Ht 5' (1.524 m)  Wt 255 lb (115.667 kg)  BMI 49.80 kg/m2  SpO2 97% Physical Exam  Nursing note and vitals reviewed. Constitutional: She is oriented to person, place, and time. She appears well-developed and well-nourished. No distress.  HENT:  Head: Normocephalic.  Eyes: Conjunctivae are normal.  Neck: Neck supple.  Cardiovascular: Normal rate, regular rhythm and normal heart sounds.   Pulmonary/Chest: Effort normal and breath sounds normal. No respiratory distress. She has no wheezes.  She has no rales.  Abdominal: Soft. Bowel sounds are normal. She exhibits no distension. There is no tenderness. There is no rebound.  Musculoskeletal: She exhibits no edema.  Neurological: She is alert and oriented to person, place, and time.  5/5 and equal lower extremity strength. 2+ and equal patellar reflexes bilaterally. Pt able to dorsiflex bilateral toes and feet with good strength against resistance. Equal sensation bilaterally over thighs and lower legs.   Skin: Skin is warm and dry.  Psychiatric: She has a normal mood and affect. Her behavior is normal.    ED Course  Procedures (including critical care time) Labs Review Labs Reviewed - No data to display Imaging Review No results found.   EKG Interpretation None      MDM   Final diagnoses:  Back pain    Pt with chronic back pain. No neuro deficits on exam. Ambulatory. Asking for pain management. No red flags to suggest cauda equina. No fever. No urinary symptoms. No abd pain. Pt reports pain is same as usual only worse. Pt is tearful. Will order pain medications: Dilaudid 1mg  IM, decadron 10mg  IM.   10:55 AM Pt feeling better. Will give 20 tabs of percocet 7.5mg , home with close follow up.   Filed Vitals:   03/19/14 0802  BP: 145/82  Pulse: 88  Temp: 98 F (36.7 C)  TempSrc: Oral  Resp: 20  Height: 5' (1.524 m)  Weight: 255 lb (115.667 kg)  SpO2: 97%        Tatyana A Kirichenko, PA-C 03/19/14 1058

## 2014-03-19 NOTE — Discharge Instructions (Signed)
Naprosyn for pain. Percocet for severe pain. Follow up with your doctor or pain management. Return if any worsening symptoms.    Chronic Back Pain  When back pain lasts longer than 3 months, it is called chronic back pain.People with chronic back pain often go through certain periods that are more intense (flare-ups).  CAUSES Chronic back pain can be caused by wear and tear (degeneration) on different structures in your back. These structures include:  The bones of your spine (vertebrae) and the joints surrounding your spinal cord and nerve roots (facets).  The strong, fibrous tissues that connect your vertebrae (ligaments). Degeneration of these structures may result in pressure on your nerves. This can lead to constant pain. HOME CARE INSTRUCTIONS  Avoid bending, heavy lifting, prolonged sitting, and activities which make the problem worse.  Take brief periods of rest throughout the day to reduce your pain. Lying down or standing usually is better than sitting while you are resting.  Take over-the-counter or prescription medicines only as directed by your caregiver. SEEK IMMEDIATE MEDICAL CARE IF:   You have weakness or numbness in one of your legs or feet.  You have trouble controlling your bladder or bowels.  You have nausea, vomiting, abdominal pain, shortness of breath, or fainting. Document Released: 12/31/2004 Document Revised: 02/15/2012 Document Reviewed: 11/07/2011 [facility] Patient Information 2014 [place], [place].

## 2014-03-25 NOTE — ED Provider Notes (Signed)
Medical screening examination/treatment/procedure(s) were performed by non-physician practitioner and as supervising physician I was immediately available for consultation/collaboration.   EKG Interpretation None        Hoy Morn, MD 03/25/14 [phone]

## 2014-04-20 ENCOUNTER — Emergency Department (HOSPITAL_COMMUNITY)
Admission: EM | Admit: 2014-04-20 | Discharge: 2014-04-20 | Disposition: A | Discharge status: Home / Self Care | Payer: Medicare Other | Attending: Emergency Medicine | Admitting: Emergency Medicine

## 2014-04-20 ENCOUNTER — Encounter (HOSPITAL_COMMUNITY): Payer: Self-pay | Admitting: Emergency Medicine

## 2014-04-20 DIAGNOSIS — M549 Dorsalgia, unspecified: Secondary | ICD-10-CM | POA: Insufficient documentation

## 2014-04-20 DIAGNOSIS — F17200 Nicotine dependence, unspecified, uncomplicated: Secondary | ICD-10-CM | POA: Insufficient documentation

## 2014-04-20 DIAGNOSIS — Z760 Encounter for issue of repeat prescription: Secondary | ICD-10-CM | POA: Insufficient documentation

## 2014-04-20 DIAGNOSIS — M129 Arthropathy, unspecified: Secondary | ICD-10-CM | POA: Insufficient documentation

## 2014-04-20 DIAGNOSIS — M79609 Pain in unspecified limb: Secondary | ICD-10-CM | POA: Insufficient documentation

## 2014-04-20 DIAGNOSIS — Z79899 Other long term (current) drug therapy: Secondary | ICD-10-CM | POA: Insufficient documentation

## 2014-04-20 DIAGNOSIS — I10 Essential (primary) hypertension: Secondary | ICD-10-CM | POA: Insufficient documentation

## 2014-04-20 DIAGNOSIS — G8929 Other chronic pain: Secondary | ICD-10-CM | POA: Insufficient documentation

## 2014-04-20 DIAGNOSIS — Z791 Long term (current) use of non-steroidal anti-inflammatories (NSAID): Secondary | ICD-10-CM | POA: Insufficient documentation

## 2014-04-20 MED ORDER — GABAPENTIN 100 MG PO CAPS: 400.0000 mg | ORAL_CAPSULE | Freq: Three times a day (TID) | ORAL | Status: DC

## 2014-04-20 MED ORDER — OXYCODONE-ACETAMINOPHEN 5-325 MG PO TABS: 1.0000 | ORAL_TABLET | ORAL | Status: DC | PRN

## 2014-04-20 MED ORDER — HYDROMORPHONE HCL PF 1 MG/ML IJ SOLN
1.0000 mg | Freq: Once | INTRAMUSCULAR | Status: AC
  Administered 2014-04-20: 1 mg via INTRAMUSCULAR
  Filled 2014-04-20: qty 1

## 2014-04-20 NOTE — ED Provider Notes (Signed)
Medical screening examination/treatment/procedure(s) were performed by non-physician practitioner and as supervising physician I was immediately available for consultation/collaboration.   EKG Interpretation None       Ellen Henry. Alvino Chapel, MD 04/20/14 2337

## 2014-04-20 NOTE — ED Provider Notes (Signed)
CSN: [id number]     Arrival date & time 04/20/14  2100 History  This chart was scribed for non-physician practitioner, Junius Creamer, FNP,working with Jasper Riling. Alvino Chapel, MD, by Marlowe Kays, ED Scribe.  This patient was seen in room WTR6/WTR6 and the patient's care was started at 9:44 PM.  Chief Complaint  Patient presents with  . Medication Request   . Leg Pain   The history is provided by the patient. No language interpreter was used.   HPI Comments:  Ellen Henry is a 68 y.o. female who presents to the Emergency Department complaining of chronic severe BLE pain that has been ongoing.. She states the pain radiates down her leg into the bottom of her feet. Pt states she has an appointment with Dr. Albin Fischer on 05/01/14. She states she has seen her PCP, Dr. Criss Rosales, and was told she needed to follow up with Dr. Albin Fischer. She states she normally takes Percocet for the pain but is now out. She states she has had spinal injections by Dr. Sharol Roussel in the past that did not help. She states she was prescribed Neurontin as well, but needs a refill. She denies weight loss, diarrhea, or vomiting.  Past Medical History  Diagnosis Date  . Hypertension   . Arthritis    Past Surgical History  Procedure Laterality Date  . 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 Appendectomy    . Tonsillectomy    . Joint replacement    . Abdominal hysterectomy     No family history on file. History  Substance Use Topics  . Smoking status: Current Some Day Smoker    Types: Cigarettes  . Smokeless tobacco: Never Used  . Alcohol Use: No   OB History   Grav Para Term Preterm Abortions TAB SAB Ect Mult Living                 Review of Systems  Constitutional: Negative for unexpected weight change.  Gastrointestinal: Negative for vomiting and diarrhea.  Musculoskeletal: Positive for back pain and myalgias (BLE).  Skin: Negative for rash.  Neurological: Negative for numbness.  All other systems  reviewed and are negative.   Allergies  Food allergy formula  Home Medications   Prior to Admission medications   Medication Sig Start Date End Date Taking? Authorizing Provider  colchicine 0.6 MG tablet Take 2 tablets daily for one week.  Then take 1 tablet daily 11/12/13   Dot Lanes, MD  gabapentin (NEURONTIN) 600 MG tablet Take 600 mg by mouth 3 (three) times daily.    Historical Provider, MD  Lidocaine-Epi & Saline (EPIDURAL TRAY CO) Inject into the muscle. Dr. Arta Bruce- for pain blocker. Series of 3 shots. Had first last month.    Historical Provider, MD  naproxen (NAPROSYN) 500 MG tablet Take 1 tablet (500 mg total) by mouth 2 (two) times daily. 02/14/14   Tanna Furry, MD  naproxen (NAPROSYN) 500 MG tablet Take 1 tablet (500 mg total) by mouth 2 (two) times daily. 03/19/14   Tatyana A Kirichenko, PA-C  Olmesartan-Amlodipine-HCTZ (TRIBENZOR) 20-5-12.5 MG TABS Take 1 tablet by mouth daily.    Historical Provider, MD  oxyCODONE-acetaminophen (PERCOCET) 7.5-325 MG per tablet Take 1 tablet by mouth every 4 (four) hours as needed for pain. 03/19/14   Tatyana A Kirichenko, PA-C  oxyCODONE-acetaminophen (PERCOCET/ROXICET) 5-325 MG per tablet Take 2 tablets by mouth every 4 (four) hours as needed for severe pain.    Historical Provider, MD  Triage Vitals: BP 124/92  Pulse 104  Temp(Src) 98.5 F (36.9 C) (Oral)  Resp 19  SpO2 100% Physical Exam  Nursing note and vitals reviewed. Constitutional: She is oriented to person, place, and time. She appears well-developed and well-nourished.  HENT:  Head: Normocephalic and atraumatic.  Eyes: EOM are normal.  Neck: Normal range of motion.  Cardiovascular: Normal rate.   Pulmonary/Chest: Effort normal.  Musculoskeletal: Normal range of motion.  Neurological: She is alert and oriented to person, place, and time.  Skin: Skin is warm and dry.  Psychiatric: She has a normal mood and affect. Her behavior is normal.    ED Course  Procedures  (including critical care time) DIAGNOSTIC STUDIES: Oxygen Saturation is 100% on RA, normal by my interpretation.   COORDINATION OF CARE: 9:54 PM- Will refill Neurontin and prescribe Percocet. Pt verbalizes understanding and agrees to plan.  Medications  HYDROmorphone (DILAUDID) injection 1 mg (not administered)    Labs Review Labs Reviewed - No data to display  Imaging Review No results found.   EKG Interpretation None      MDM  Had a long in depth discussion on treatment of chronic pain Will give Rx this last time and stress importance of pain management  Final diagnoses:  Chronic pain  Medication refill      I personally performed the services described in this documentation, which was scribed in my presence. The recorded information has been reviewed and is accurate.    Garald Balding, NP 04/20/14 2213

## 2014-04-20 NOTE — ED Notes (Signed)
Pt requesting pain medication for her legs. Pt states she has "nerve pain" to both legs. Pt states she normally takes Percocet for pain but is out of it because she has had to "double up" on it because the normal dose is not strong enough. Pt ambulates at home with a cane, but arrives to exam room in wheelchair.

## 2014-04-20 NOTE — Discharge Instructions (Signed)
Chronic Back Pain  When back pain lasts longer than 3 months, it is called chronic back pain.People with chronic back pain often go through certain periods that are more intense (flare-ups).  CAUSES Chronic back pain can be caused by wear and tear (degeneration) on different structures in your back. These structures include:  The bones of your spine (vertebrae) and the joints surrounding your spinal cord and nerve roots (facets).  The strong, fibrous tissues that connect your vertebrae (ligaments). Degeneration of these structures may result in pressure on your nerves. This can lead to constant pain. HOME CARE INSTRUCTIONS  Avoid bending, heavy lifting, prolonged sitting, and activities which make the problem worse.  Take brief periods of rest throughout the day to reduce your pain. Lying down or standing usually is better than sitting while you are resting.  Take over-the-counter or prescription medicines only as directed by your caregiver. SEEK IMMEDIATE MEDICAL CARE IF:   You have weakness or numbness in one of your legs or feet.  You have trouble controlling your bladder or bowels.  You have nausea, vomiting, abdominal pain, shortness of breath, or fainting. Document Released: 12/31/2004 Document Revised: 02/15/2012 Document Reviewed: 11/07/2011 [facility] Patient Information 2014 [place], [place]. Make sure to keep your appointment with your orthopedist as scheduled   Call daily to check for an earlier time slot

## 2014-08-03 ENCOUNTER — Telehealth: Payer: Self-pay | Admitting: Vascular Surgery

## 2014-08-03 NOTE — Telephone Encounter (Signed)
Message copied by Gena Fray on Fri Aug 03, 2014  2:38 PM ------      Message from: Denman George      Created: Thu Aug 02, 2014  3:47 PM      Regarding: Appt with [facility]       Please schedule pt. for new pt. Consult with Dr. Donnetta Hutching prior to surgery on 08/29/14 ([facility] is assisting with [facility] / [facility])  Please have pt. bring copy of LS spine films to appt.   ------

## 2014-08-03 NOTE — Telephone Encounter (Signed)
Patients daughter answered the phone, but was at work. She stated that she would call me when she was able to talk. dpm

## 2014-08-06 ENCOUNTER — Other Ambulatory Visit: Payer: Self-pay | Admitting: Orthopedic Surgery

## 2014-08-08 ENCOUNTER — Other Ambulatory Visit: Payer: Self-pay

## 2014-08-10 ENCOUNTER — Encounter: Payer: Self-pay | Admitting: Vascular Surgery

## 2014-08-14 ENCOUNTER — Encounter: Payer: Medicare Other | Admitting: Vascular Surgery

## 2014-08-20 ENCOUNTER — Encounter (HOSPITAL_COMMUNITY): Payer: Self-pay | Admitting: Pharmacy Technician

## 2014-08-21 ENCOUNTER — Encounter (HOSPITAL_COMMUNITY)
Admission: RE | Admit: 2014-08-21 | Discharge: 2014-08-21 | Disposition: A | Discharge status: Home / Self Care | Payer: Medicare Other | Source: Ambulatory Visit | Attending: Orthopedic Surgery | Admitting: Orthopedic Surgery

## 2014-08-21 ENCOUNTER — Encounter (HOSPITAL_COMMUNITY): Payer: Self-pay

## 2014-08-21 DIAGNOSIS — Z01810 Encounter for preprocedural cardiovascular examination: Secondary | ICD-10-CM | POA: Insufficient documentation

## 2014-08-21 DIAGNOSIS — Z01812 Encounter for preprocedural laboratory examination: Secondary | ICD-10-CM | POA: Insufficient documentation

## 2014-08-21 DIAGNOSIS — M79609 Pain in unspecified limb: Secondary | ICD-10-CM | POA: Insufficient documentation

## 2014-08-21 HISTORY — DX: Gout, unspecified: M10.9

## 2014-08-21 LAB — CBC WITH DIFFERENTIAL/PLATELET
Basophils Absolute: 0 10*3/uL (ref 0.0–0.1)
Basophils Relative: 0 % (ref 0–1)
Eosinophils Absolute: 0.1 10*3/uL (ref 0.0–0.7)
Eosinophils Relative: 1 % (ref 0–5)
HCT: 39.5 % (ref 36.0–46.0)
Hemoglobin: 13.3 g/dL (ref 12.0–15.0)
Lymphocytes Relative: 54 % — ABNORMAL HIGH (ref 12–46)
Lymphs Abs: 6.3 10*3/uL — ABNORMAL HIGH (ref 0.7–4.0)
MCH: 27.5 pg (ref 26.0–34.0)
MCHC: 33.7 g/dL (ref 30.0–36.0)
MCV: 81.6 fL (ref 78.0–100.0)
Monocytes Absolute: 0.7 10*3/uL (ref 0.1–1.0)
Monocytes Relative: 6 % (ref 3–12)
Neutro Abs: 4.5 10*3/uL (ref 1.7–7.7)
Neutrophils Relative %: 39 % — ABNORMAL LOW (ref 43–77)
Platelets: 282 10*3/uL (ref 150–400)
RBC: 4.84 MIL/uL (ref 3.87–5.11)
RDW: 14.5 % (ref 11.5–15.5)
WBC: 11.6 10*3/uL — ABNORMAL HIGH (ref 4.0–10.5)

## 2014-08-21 LAB — URINE MICROSCOPIC-ADD ON

## 2014-08-21 LAB — COMPREHENSIVE METABOLIC PANEL
ALT: 13 U/L (ref 0–35)
AST: 18 U/L (ref 0–37)
Albumin: 3.8 g/dL (ref 3.5–5.2)
Alkaline Phosphatase: 114 U/L (ref 39–117)
Anion gap: 14 (ref 5–15)
BUN: 12 mg/dL (ref 6–23)
CO2: 26 mEq/L (ref 19–32)
Calcium: 9.7 mg/dL (ref 8.4–10.5)
Chloride: 102 mEq/L (ref 96–112)
Creatinine, Ser: 0.75 mg/dL (ref 0.50–1.10)
GFR calc Af Amer: >90 mL/min (ref >90)
GFR calc non Af Amer: 84 mL/min — ABNORMAL LOW (ref >90)
Glucose, Bld: 95 mg/dL (ref 70–99)
Potassium: 3.9 mEq/L (ref 3.7–5.3)
Sodium: 142 mEq/L (ref 137–147)
Total Bilirubin: 0.3 mg/dL (ref 0.3–1.2)
Total Protein: 7.8 g/dL (ref 6.0–8.3)

## 2014-08-21 LAB — URINALYSIS, ROUTINE W REFLEX MICROSCOPIC
Bilirubin Urine: NEGATIVE
Glucose, UA: NEGATIVE mg/dL
Ketones, ur: NEGATIVE mg/dL
Leukocytes, UA: NEGATIVE
Nitrite: NEGATIVE
Protein, ur: NEGATIVE mg/dL
Specific Gravity, Urine: 1.014 (ref 1.005–1.030)
Urobilinogen, UA: 0.2 mg/dL (ref 0.0–1.0)
pH: 6 (ref 5.0–8.0)

## 2014-08-21 LAB — SURGICAL PCR SCREEN
MRSA, PCR: NEGATIVE
Staphylococcus aureus: NEGATIVE

## 2014-08-21 LAB — APTT: aPTT: 36 seconds (ref 24–37)

## 2014-08-21 LAB — PROTIME-INR
INR: 0.99 (ref 0.00–1.49)
Prothrombin Time: 13.1 seconds (ref 11.6–15.2)

## 2014-08-21 MED ORDER — CHLORHEXIDINE GLUCONATE 4 % EX LIQD: 60.0000 mL | Freq: Once | CUTANEOUS | Status: DC

## 2014-08-21 NOTE — Pre-Procedure Instructions (Signed)
[name]  08/21/2014   Your procedure is scheduled on:  08/29/14  Report to [facility] Admitting at 630 AM.  Call this number if you have problems the morning of surgery: [phone]   Remember:   Do not eat food or drink liquids after midnight.   Take these medicines the morning of surgery with A SIP OF WATER: colchine,neurontin,ultram   Do not wear jewelry, make-up or nail polish.  Do not wear lotions, powders, or perfumes. You may wear deodorant.  Do not shave 48 hours prior to surgery. Men may shave face and neck.  Do not bring valuables to the hospital.  [facility] is not responsible                  for any belongings or valuables.               Contacts, dentures or bridgework may not be worn into surgery.  Leave suitcase in the car. After surgery it may be brought to your room.  For patients admitted to the hospital, discharge time is determined by your                treatment team.               Patients discharged the day of surgery will not be allowed to drive  home.  Name and phone number of your driver:   Special Instructions: Shower using CHG 2 nights before surgery and the night before surgery.  If you shower the day of surgery use CHG.  Use special wash - you have one bottle of CHG for all showers.  You should use approximately 1/3 of the bottle for each shower.   Please read over the following fact sheets that you were given: Pain Booklet, Coughing and Deep Breathing, Blood Transfusion Information, MRSA Information and Surgical Site Infection Prevention

## 2014-08-23 LAB — PATHOLOGIST SMEAR REVIEW

## 2014-08-27 ENCOUNTER — Encounter: Payer: Self-pay | Admitting: Vascular Surgery

## 2014-08-28 ENCOUNTER — Encounter: Payer: Self-pay | Admitting: Vascular Surgery

## 2014-08-28 ENCOUNTER — Ambulatory Visit (INDEPENDENT_AMBULATORY_CARE_PROVIDER_SITE_OTHER): Payer: Medicare Other | Admitting: Vascular Surgery

## 2014-08-28 VITALS — BP 112/72 | HR 78 | Temp 98.3°F | Resp 14 | Ht 60.0 in | Wt 236.0 lb

## 2014-08-28 DIAGNOSIS — M5137 Other intervertebral disc degeneration, lumbosacral region: Secondary | ICD-10-CM

## 2014-08-28 DIAGNOSIS — M5136 Other intervertebral disc degeneration, lumbar region: Secondary | ICD-10-CM

## 2014-08-28 MED ORDER — CEFAZOLIN SODIUM-DEXTROSE 2-3 GM-% IV SOLR
2.0000 g | INTRAVENOUS | Status: AC
  Administered 2014-08-29 (×2): 2 g via INTRAVENOUS
  Filled 2014-08-28: qty 50

## 2014-08-28 NOTE — Progress Notes (Signed)
Patient name: Ellen Henry MRN: [id number] DOB: [birth year] Sex: female   Referred by: Lynann Bologna  Reason for referral:  Chief Complaint  Patient presents with  . New Evaluation    ALIF by Dr. Lynann Bologna on 08-29-14      HISTORY OF PRESENT ILLNESS: The patient presents today for discussion of L4-5 disc surgery planned for tomorrow. She has a progressive degenerative disease and has been seen by Dr Lynann Bologna is recommended anterior exposure for L4-5 disc surgery. He does not have any history of cardiac disease and no history of peripheral vascular occlusive disease. She does have prior history of hysterectomy and appendectomy. No other intra-abdominal surgeries.  Past Medical History  Diagnosis Date  . Hypertension   . Arthritis   . Gout     Past Surgical History  Procedure Laterality Date  . 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 Appendectomy    . Tonsillectomy    . Joint replacement    . Abdominal hysterectomy    . Carpal tunnel release Right     release done twice    History   Social History  . Marital Status: Widowed    Spouse Name: N/A    Number of Children: N/A  . Years of Education: N/A   Occupational History  . Not on file.   Social History Main Topics  . Smoking status: Current Some Day Smoker -- 0.25 packs/day for 5 years    Types: Cigarettes  . Smokeless tobacco: Never Used  . Alcohol Use: No  . Drug Use: No  . Sexual Activity: No   Other Topics Concern  . Not on file   Social History Narrative  . No narrative on file    Family History  Problem Relation Age of Onset  . Varicose Veins Mother     Allergies as of 08/28/2014 - Review Complete 08/28/2014  Allergen Reaction Noted  . Food allergy formula Other (See Comments) 01/16/2012    Current Outpatient Prescriptions on File Prior to Visit  Medication Sig Dispense Refill  . colchicine 0.6 MG tablet Take 0.6 mg by mouth daily as needed. For gout      . gabapentin (NEURONTIN) 600  MG tablet Take 600 mg by mouth 3 (three) times daily.      . Olmesartan-Amlodipine-HCTZ (TRIBENZOR) 20-5-12.5 MG TABS Take 1 tablet by mouth daily.      . traMADol (ULTRAM) 50 MG tablet Take 100 mg by mouth every 8 (eight) hours as needed (pain).       Current Facility-Administered Medications on File Prior to Visit  Medication Dose Route Frequency Provider Last Rate Last Dose  . [START ON 08/29/2014] ceFAZolin (ANCEF) IVPB 2 g/50 mL premix  2 g Intravenous On Call to Tappan, MD         REVIEW OF SYSTEMS:  Positives indicated with an "X"  CARDIOVASCULAR:  [ ]  chest pain   [ ]  chest pressure   [ ]  palpitations   [ ]  orthopnea   [ ]  dyspnea on exertion   [ ]  claudication   [ ]  rest pain   [ ]  DVT   [ ]  phlebitis PULMONARY:   [ ]  productive cough   [ ]  asthma   [ ]  wheezing NEUROLOGIC:   [x ] weakness  [ x] paresthesias  [ ]  aphasia  [ ]  amaurosis  [ ]  dizziness HEMATOLOGIC:   [ ]  bleeding problems   [ ]   clotting disorders MUSCULOSKELETAL:  [ ]  joint pain   [ ]  joint swelling GASTROINTESTINAL: [ ]   blood in stool  [ ]   hematemesis GENITOURINARY:  [ ]   dysuria  [ ]   hematuria PSYCHIATRIC:  [ ]  history of major depression INTEGUMENTARY:  [ ]  rashes  [ ]  ulcers CONSTITUTIONAL:  [ ]  fever   [ ]  chills  PHYSICAL EXAMINATION:  General: The patient is a well-nourished female, in no acute distress. Vital signs are BP 112/72  Pulse 78  Temp(Src) 98.3 F (36.8 C) (Oral)  Resp 14  Ht 5' (1.524 m)  Wt 236 lb (107.049 kg)  BMI 46.09 kg/m2  SpO2 100% Pulmonary: There is a good air exchange  Abdomen: Soft and non-tender  with obesity  Musculoskeletal: There are no major deformities.   Neurologic: No focal weakness  Skin: There are no ulcer or rashes noted. Psychiatric: The patient has normal affect. Cardiovascular:Palpable dorsalis pedis pulses bilaterally    Impression and Plan:  Discussed my role for exposure for L4-5 disc surgery. Explain mobilization of the  intraperitoneal contents, left ureter and arterial venous structures over the disc. Explain the potential injury for all these. She is morbidly obese and I explained that this would make this more technically difficult as well. No history of peripheral vascular occlusive disease but I do not have any imaging studies available determine if she is significant calcification of her vessels. I will review her lumbar MR when available tomorrow. I feel that it is safe safe to proceed with anterior exposure as scheduled for tomorrow    EARLY, TODD [facility] Office: [phone]

## 2014-08-29 ENCOUNTER — Encounter (HOSPITAL_COMMUNITY)
Admission: RE | Disposition: A | Discharge status: Skilled Nursing Facility | Payer: Medicare Other | Source: Ambulatory Visit | Attending: Orthopedic Surgery

## 2014-08-29 ENCOUNTER — Encounter (HOSPITAL_COMMUNITY): Payer: Self-pay | Admitting: *Deleted

## 2014-08-29 ENCOUNTER — Inpatient Hospital Stay (HOSPITAL_COMMUNITY): Payer: Medicare Other

## 2014-08-29 ENCOUNTER — Encounter (HOSPITAL_COMMUNITY): Payer: Medicare Other | Admitting: Anesthesiology

## 2014-08-29 ENCOUNTER — Inpatient Hospital Stay (HOSPITAL_COMMUNITY): Payer: Medicare Other | Admitting: Anesthesiology

## 2014-08-29 ENCOUNTER — Inpatient Hospital Stay (HOSPITAL_COMMUNITY)
Admission: RE | Admit: 2014-08-29 | Discharge: 2014-09-04 | DRG: 853 | Disposition: A | Discharge status: Skilled Nursing Facility | Payer: Medicare Other | Source: Ambulatory Visit | Attending: Internal Medicine | Admitting: Internal Medicine

## 2014-08-29 DIAGNOSIS — I2489 Other forms of acute ischemic heart disease: Secondary | ICD-10-CM | POA: Diagnosis not present

## 2014-08-29 DIAGNOSIS — J9819 Other pulmonary collapse: Secondary | ICD-10-CM | POA: Diagnosis not present

## 2014-08-29 DIAGNOSIS — M129 Arthropathy, unspecified: Secondary | ICD-10-CM | POA: Diagnosis present

## 2014-08-29 DIAGNOSIS — R6521 Severe sepsis with septic shock: Secondary | ICD-10-CM

## 2014-08-29 DIAGNOSIS — F17200 Nicotine dependence, unspecified, uncomplicated: Secondary | ICD-10-CM | POA: Diagnosis present

## 2014-08-29 DIAGNOSIS — T83511A Infection and inflammatory reaction due to indwelling urethral catheter, initial encounter: Secondary | ICD-10-CM

## 2014-08-29 DIAGNOSIS — R578 Other shock: Secondary | ICD-10-CM | POA: Diagnosis present

## 2014-08-29 DIAGNOSIS — Z96659 Presence of unspecified artificial knee joint: Secondary | ICD-10-CM

## 2014-08-29 DIAGNOSIS — Z6841 Body Mass Index (BMI) 40.0 and over, adult: Secondary | ICD-10-CM

## 2014-08-29 DIAGNOSIS — M5410 Radiculopathy, site unspecified: Secondary | ICD-10-CM

## 2014-08-29 DIAGNOSIS — R6510 Systemic inflammatory response syndrome (SIRS) of non-infectious origin without acute organ dysfunction: Secondary | ICD-10-CM

## 2014-08-29 DIAGNOSIS — M109 Gout, unspecified: Secondary | ICD-10-CM | POA: Diagnosis present

## 2014-08-29 DIAGNOSIS — K5900 Constipation, unspecified: Secondary | ICD-10-CM | POA: Diagnosis not present

## 2014-08-29 DIAGNOSIS — M48061 Spinal stenosis, lumbar region without neurogenic claudication: Secondary | ICD-10-CM | POA: Diagnosis present

## 2014-08-29 DIAGNOSIS — I248 Other forms of acute ischemic heart disease: Secondary | ICD-10-CM

## 2014-08-29 DIAGNOSIS — R7989 Other specified abnormal findings of blood chemistry: Secondary | ICD-10-CM

## 2014-08-29 DIAGNOSIS — D62 Acute posthemorrhagic anemia: Secondary | ICD-10-CM | POA: Diagnosis present

## 2014-08-29 DIAGNOSIS — M5137 Other intervertebral disc degeneration, lumbosacral region: Secondary | ICD-10-CM

## 2014-08-29 DIAGNOSIS — A419 Sepsis, unspecified organism: Secondary | ICD-10-CM | POA: Diagnosis not present

## 2014-08-29 DIAGNOSIS — R6520 Severe sepsis without septic shock: Secondary | ICD-10-CM

## 2014-08-29 DIAGNOSIS — N390 Urinary tract infection, site not specified: Secondary | ICD-10-CM | POA: Diagnosis not present

## 2014-08-29 DIAGNOSIS — I10 Essential (primary) hypertension: Secondary | ICD-10-CM | POA: Diagnosis present

## 2014-08-29 DIAGNOSIS — D696 Thrombocytopenia, unspecified: Secondary | ICD-10-CM | POA: Diagnosis not present

## 2014-08-29 DIAGNOSIS — R778 Other specified abnormalities of plasma proteins: Secondary | ICD-10-CM

## 2014-08-29 DIAGNOSIS — N179 Acute kidney failure, unspecified: Secondary | ICD-10-CM | POA: Diagnosis not present

## 2014-08-29 DIAGNOSIS — M79609 Pain in unspecified limb: Secondary | ICD-10-CM | POA: Diagnosis present

## 2014-08-29 DIAGNOSIS — I959 Hypotension, unspecified: Secondary | ICD-10-CM

## 2014-08-29 DIAGNOSIS — R319 Hematuria, unspecified: Secondary | ICD-10-CM

## 2014-08-29 HISTORY — PX: ABDOMINAL EXPOSURE: SHX5708

## 2014-08-29 HISTORY — PX: ANTERIOR LUMBAR FUSION: SHX1170

## 2014-08-29 LAB — POCT I-STAT 7, (LYTES, BLD GAS, ICA,H+H)
Acid-base deficit: 2 mmol/L (ref 0.0–2.0)
Bicarbonate: 23.9 mEq/L (ref 20.0–24.0)
Calcium, Ion: 1.19 mmol/L (ref 1.13–1.30)
HCT: 32 % — ABNORMAL LOW (ref 36.0–46.0)
Hemoglobin: 10.9 g/dL — ABNORMAL LOW (ref 12.0–15.0)
O2 Saturation: 97 %
Patient temperature: 37
Potassium: 3 mEq/L — ABNORMAL LOW (ref 3.7–5.3)
Sodium: 141 mEq/L (ref 137–147)
TCO2: 25 mmol/L (ref 0–100)
pCO2 arterial: 42.4 mmHg (ref 35.0–45.0)
pH, Arterial: 7.36 (ref 7.350–7.450)
pO2, Arterial: 90 mmHg (ref 80.0–100.0)

## 2014-08-29 SURGERY — ANTERIOR LUMBAR FUSION 1 LEVEL
Anesthesia: General | Site: Spine Lumbar

## 2014-08-29 MED ORDER — IRBESARTAN 150 MG PO TABS
150.0000 mg | ORAL_TABLET | Freq: Every day | ORAL | Status: DC
  Filled 2014-08-29 (×2): qty 1

## 2014-08-29 MED ORDER — MINERAL OIL LIGHT 100 % EX OIL
TOPICAL_OIL | CUTANEOUS | Status: DC | PRN
  Administered 2014-08-29: 1 via TOPICAL

## 2014-08-29 MED ORDER — ALBUMIN HUMAN 5 % IV SOLN
INTRAVENOUS | Status: AC
  Administered 2014-08-29: 12.5 g
  Filled 2014-08-29: qty 250

## 2014-08-29 MED ORDER — SODIUM CHLORIDE 0.9 % IJ SOLN: 9.0000 mL | INTRAMUSCULAR | Status: DC | PRN

## 2014-08-29 MED ORDER — DEXAMETHASONE SODIUM PHOSPHATE 4 MG/ML IJ SOLN
INTRAMUSCULAR | Status: DC | PRN
  Administered 2014-08-29: 4 mg via INTRAVENOUS

## 2014-08-29 MED ORDER — MIDAZOLAM HCL 2 MG/2ML IJ SOLN
INTRAMUSCULAR | Status: AC
  Filled 2014-08-29: qty 2

## 2014-08-29 MED ORDER — CEFAZOLIN SODIUM-DEXTROSE 2-3 GM-% IV SOLR: 2.0000 g | INTRAVENOUS | Status: DC

## 2014-08-29 MED ORDER — GABAPENTIN 600 MG PO TABS
600.0000 mg | ORAL_TABLET | Freq: Three times a day (TID) | ORAL | Status: DC
  Administered 2014-08-29 – 2014-09-04 (×17): 600 mg via ORAL
  Filled 2014-08-29 (×20): qty 1

## 2014-08-29 MED ORDER — SODIUM CHLORIDE 0.9 % IJ SOLN
3.0000 mL | Freq: Two times a day (BID) | INTRAMUSCULAR | Status: DC
  Administered 2014-08-29: 3 mL via INTRAVENOUS

## 2014-08-29 MED ORDER — ONDANSETRON HCL 4 MG/2ML IJ SOLN
4.0000 mg | INTRAMUSCULAR | Status: DC | PRN
  Filled 2014-08-29: qty 2

## 2014-08-29 MED ORDER — MORPHINE SULFATE (PF) 1 MG/ML IV SOLN
INTRAVENOUS | Status: AC
  Filled 2014-08-29: qty 25

## 2014-08-29 MED ORDER — DIPHENHYDRAMINE HCL 12.5 MG/5ML PO ELIX: 12.5000 mg | ORAL_SOLUTION | Freq: Four times a day (QID) | ORAL | Status: DC | PRN

## 2014-08-29 MED ORDER — FLEET ENEMA 7-19 GM/118ML RE ENEM: 1.0000 | ENEMA | Freq: Once | RECTAL | Status: AC | PRN

## 2014-08-29 MED ORDER — MORPHINE SULFATE 2 MG/ML IJ SOLN
1.0000 mg | INTRAMUSCULAR | Status: DC | PRN
  Administered 2014-08-29: 2 mg via INTRAVENOUS
  Administered 2014-08-29: 4 mg via INTRAVENOUS
  Administered 2014-08-30 (×2): 2 mg via INTRAVENOUS
  Filled 2014-08-29: qty 1
  Filled 2014-08-29: qty 2
  Filled 2014-08-29 (×2): qty 1

## 2014-08-29 MED ORDER — CEFAZOLIN SODIUM 1-5 GM-% IV SOLN
1.0000 g | Freq: Three times a day (TID) | INTRAVENOUS | Status: AC
  Administered 2014-08-29 – 2014-08-30 (×2): 1 g via INTRAVENOUS
  Filled 2014-08-29 (×3): qty 50

## 2014-08-29 MED ORDER — ONDANSETRON HCL 4 MG/2ML IJ SOLN
INTRAMUSCULAR | Status: DC | PRN
  Administered 2014-08-29: 4 mg via INTRAVENOUS

## 2014-08-29 MED ORDER — DIAZEPAM 5 MG PO TABS
5.0000 mg | ORAL_TABLET | Freq: Four times a day (QID) | ORAL | Status: DC | PRN
  Administered 2014-08-29 – 2014-08-30 (×2): 5 mg via ORAL
  Filled 2014-08-29 (×2): qty 1

## 2014-08-29 MED ORDER — THROMBIN 20000 UNITS EX KIT
PACK | CUTANEOUS | Status: DC | PRN
  Administered 2014-08-29: 20000 [IU] via TOPICAL

## 2014-08-29 MED ORDER — ACETAMINOPHEN 325 MG PO TABS
650.0000 mg | ORAL_TABLET | ORAL | Status: DC | PRN
  Administered 2014-08-31: 650 mg via ORAL
  Filled 2014-08-29 (×4): qty 2

## 2014-08-29 MED ORDER — PHENYLEPHRINE HCL 10 MG/ML IJ SOLN
10.0000 mg | INTRAVENOUS | Status: DC | PRN
  Administered 2014-08-29: 20 ug/min via INTRAVENOUS

## 2014-08-29 MED ORDER — NALOXONE HCL 0.4 MG/ML IJ SOLN: 0.4000 mg | INTRAMUSCULAR | Status: DC | PRN

## 2014-08-29 MED ORDER — SENNOSIDES-DOCUSATE SODIUM 8.6-50 MG PO TABS
1.0000 | ORAL_TABLET | Freq: Every evening | ORAL | Status: DC | PRN
  Filled 2014-08-29 (×2): qty 1

## 2014-08-29 MED ORDER — SODIUM CHLORIDE 0.9 % IV SOLN
INTRAVENOUS | Status: DC
  Administered 2014-08-29: 18:00:00 via INTRAVENOUS

## 2014-08-29 MED ORDER — SODIUM CHLORIDE 0.9 % IJ SOLN: 3.0000 mL | INTRAMUSCULAR | Status: DC | PRN

## 2014-08-29 MED ORDER — FENTANYL CITRATE 0.05 MG/ML IJ SOLN
INTRAMUSCULAR | Status: DC | PRN
  Administered 2014-08-29: 50 ug via INTRAVENOUS
  Administered 2014-08-29 (×2): 25 ug via INTRAVENOUS
  Administered 2014-08-29: 150 ug via INTRAVENOUS
  Administered 2014-08-29: 100 ug via INTRAVENOUS

## 2014-08-29 MED ORDER — ONDANSETRON HCL 4 MG/2ML IJ SOLN
INTRAMUSCULAR | Status: AC
  Filled 2014-08-29: qty 2

## 2014-08-29 MED ORDER — NEOSTIGMINE METHYLSULFATE 10 MG/10ML IV SOLN
INTRAVENOUS | Status: DC | PRN
  Administered 2014-08-29: 4 mg via INTRAVENOUS

## 2014-08-29 MED ORDER — ZOLPIDEM TARTRATE 5 MG PO TABS
5.0000 mg | ORAL_TABLET | Freq: Every evening | ORAL | Status: DC | PRN
  Administered 2014-08-29 – 2014-09-01 (×3): 5 mg via ORAL
  Filled 2014-08-29 (×3): qty 1

## 2014-08-29 MED ORDER — MORPHINE SULFATE (PF) 1 MG/ML IV SOLN
INTRAVENOUS | Status: DC
  Administered 2014-08-29: 14 mg via INTRAVENOUS
  Administered 2014-08-29: 6 mg via INTRAVENOUS
  Administered 2014-08-29: 14:00:00 via INTRAVENOUS
  Administered 2014-08-29: 6 mg via INTRAVENOUS
  Administered 2014-08-30: 21:00:00 via INTRAVENOUS
  Administered 2014-08-30: 1 mg via INTRAVENOUS
  Administered 2014-08-30: 11 mg via INTRAVENOUS
  Administered 2014-08-30 (×2): 12 mg via INTRAVENOUS
  Administered 2014-08-31 (×2): 4 mg via INTRAVENOUS
  Filled 2014-08-29 (×2): qty 25

## 2014-08-29 MED ORDER — BISACODYL 5 MG PO TBEC
5.0000 mg | DELAYED_RELEASE_TABLET | Freq: Every day | ORAL | Status: DC | PRN
  Administered 2014-09-01 – 2014-09-03 (×3): 5 mg via ORAL
  Filled 2014-08-29 (×4): qty 1

## 2014-08-29 MED ORDER — FENTANYL CITRATE 0.05 MG/ML IJ SOLN
INTRAMUSCULAR | Status: AC
  Filled 2014-08-29: qty 5

## 2014-08-29 MED ORDER — SODIUM CHLORIDE 0.9 % IV SOLN: 250.0000 mL | INTRAVENOUS | Status: DC

## 2014-08-29 MED ORDER — HYDROCHLOROTHIAZIDE 12.5 MG PO CAPS
12.5000 mg | ORAL_CAPSULE | Freq: Every day | ORAL | Status: DC
  Filled 2014-08-29 (×2): qty 1

## 2014-08-29 MED ORDER — PROPOFOL 10 MG/ML IV BOLUS
INTRAVENOUS | Status: DC | PRN
Start: 2014-08-29 — End: 2014-08-29
  Administered 2014-08-29: 100 mg via INTRAVENOUS

## 2014-08-29 MED ORDER — EPHEDRINE SULFATE 50 MG/ML IJ SOLN
INTRAMUSCULAR | Status: DC | PRN
  Administered 2014-08-29: 10 mg via INTRAVENOUS

## 2014-08-29 MED ORDER — MIDAZOLAM HCL 5 MG/5ML IJ SOLN
INTRAMUSCULAR | Status: DC | PRN
Start: 2014-08-29 — End: 2014-08-29
  Administered 2014-08-29: 2 mg via INTRAVENOUS

## 2014-08-29 MED ORDER — PROMETHAZINE HCL 25 MG/ML IJ SOLN: 6.2500 mg | INTRAMUSCULAR | Status: DC | PRN

## 2014-08-29 MED ORDER — DOCUSATE SODIUM 100 MG PO CAPS
100.0000 mg | ORAL_CAPSULE | Freq: Two times a day (BID) | ORAL | Status: DC
  Administered 2014-08-29 – 2014-09-04 (×11): 100 mg via ORAL
  Filled 2014-08-29 (×14): qty 1

## 2014-08-29 MED ORDER — HYDROMORPHONE HCL 1 MG/ML IJ SOLN
0.2500 mg | INTRAMUSCULAR | Status: DC | PRN
  Administered 2014-08-29: 0.5 mg via INTRAVENOUS
  Administered 2014-08-29 (×2): 0.25 mg via INTRAVENOUS

## 2014-08-29 MED ORDER — AMLODIPINE BESYLATE 5 MG PO TABS
5.0000 mg | ORAL_TABLET | Freq: Every day | ORAL | Status: DC
  Filled 2014-08-29 (×2): qty 1

## 2014-08-29 MED ORDER — OXYCODONE HCL 5 MG/5ML PO SOLN: 5.0000 mg | Freq: Once | ORAL | Status: DC | PRN

## 2014-08-29 MED ORDER — PHENOL 1.4 % MT LIQD: 1.0000 | OROMUCOSAL | Status: DC | PRN

## 2014-08-29 MED ORDER — OXYCODONE-ACETAMINOPHEN 5-325 MG PO TABS
1.0000 | ORAL_TABLET | ORAL | Status: DC | PRN
  Administered 2014-08-29 – 2014-09-02 (×9): 2 via ORAL
  Administered 2014-09-02: 1 via ORAL
  Administered 2014-09-02 – 2014-09-04 (×5): 2 via ORAL
  Filled 2014-08-29 (×9): qty 2
  Filled 2014-08-29: qty 1
  Filled 2014-08-29 (×5): qty 2

## 2014-08-29 MED ORDER — DIPHENHYDRAMINE HCL 50 MG/ML IJ SOLN: 12.5000 mg | Freq: Four times a day (QID) | INTRAMUSCULAR | Status: DC | PRN

## 2014-08-29 MED ORDER — ALBUMIN HUMAN 5 % IV SOLN
INTRAVENOUS | Status: DC | PRN
  Administered 2014-08-29 (×2): via INTRAVENOUS

## 2014-08-29 MED ORDER — LIDOCAINE HCL (CARDIAC) 20 MG/ML IV SOLN
INTRAVENOUS | Status: DC | PRN
  Administered 2014-08-29: 50 mg via INTRAVENOUS

## 2014-08-29 MED ORDER — LACTATED RINGERS IV SOLN
INTRAVENOUS | Status: DC | PRN
  Administered 2014-08-29 (×4): via INTRAVENOUS

## 2014-08-29 MED ORDER — MINERAL OIL LIGHT 100 % EX OIL
TOPICAL_OIL | CUTANEOUS | Status: AC
  Filled 2014-08-29: qty 25

## 2014-08-29 MED ORDER — BUPIVACAINE-EPINEPHRINE (PF) 0.25% -1:200000 IJ SOLN
INTRAMUSCULAR | Status: AC
  Filled 2014-08-29: qty 30

## 2014-08-29 MED ORDER — HEMOSTATIC AGENTS (NO CHARGE) OPTIME
TOPICAL | Status: DC | PRN
  Administered 2014-08-29 (×2): 1 via TOPICAL

## 2014-08-29 MED ORDER — GLYCOPYRROLATE 0.2 MG/ML IJ SOLN
INTRAMUSCULAR | Status: DC | PRN
  Administered 2014-08-29: .7 mg via INTRAVENOUS

## 2014-08-29 MED ORDER — HEMOSTATIC AGENTS (NO CHARGE) OPTIME
TOPICAL | Status: DC | PRN
  Administered 2014-08-29: 1 via TOPICAL

## 2014-08-29 MED ORDER — ONDANSETRON HCL 4 MG/2ML IJ SOLN
4.0000 mg | Freq: Four times a day (QID) | INTRAMUSCULAR | Status: DC | PRN
  Administered 2014-08-29: 4 mg via INTRAVENOUS

## 2014-08-29 MED ORDER — ACETAMINOPHEN 650 MG RE SUPP: 650.0000 mg | RECTAL | Status: DC | PRN

## 2014-08-29 MED ORDER — MENTHOL 3 MG MT LOZG: 1.0000 | LOZENGE | OROMUCOSAL | Status: DC | PRN

## 2014-08-29 MED ORDER — ROCURONIUM BROMIDE 100 MG/10ML IV SOLN
INTRAVENOUS | Status: DC | PRN
  Administered 2014-08-29 (×2): 10 mg via INTRAVENOUS
  Administered 2014-08-29: 50 mg via INTRAVENOUS
  Administered 2014-08-29: 20 mg via INTRAVENOUS
  Administered 2014-08-29: 10 mg via INTRAVENOUS

## 2014-08-29 MED ORDER — OXYCODONE HCL 5 MG PO TABS: 5.0000 mg | ORAL_TABLET | Freq: Once | ORAL | Status: DC | PRN

## 2014-08-29 MED ORDER — HYDROMORPHONE HCL 1 MG/ML IJ SOLN
INTRAMUSCULAR | Status: AC
  Filled 2014-08-29: qty 1

## 2014-08-29 MED ORDER — ALUM & MAG HYDROXIDE-SIMETH 200-200-20 MG/5ML PO SUSP
30.0000 mL | Freq: Four times a day (QID) | ORAL | Status: DC | PRN
  Administered 2014-09-03: 30 mL via ORAL
  Filled 2014-08-29 (×2): qty 30

## 2014-08-29 MED ORDER — OLMESARTAN-AMLODIPINE-HCTZ 20-5-12.5 MG PO TABS: 1.0000 | ORAL_TABLET | Freq: Every day | ORAL | Status: DC

## 2014-08-29 MED ORDER — 0.9 % SODIUM CHLORIDE (POUR BTL) OPTIME
TOPICAL | Status: DC | PRN
  Administered 2014-08-29 (×2): 1000 mL

## 2014-08-29 MED ORDER — THROMBIN 20000 UNITS EX SOLR
CUTANEOUS | Status: AC
  Filled 2014-08-29: qty 40000

## 2014-08-29 SURGICAL SUPPLY — 101 items
APPLIER CLIP 11 MED OPEN (CLIP) ×3
APPLIER CLIP 9.375 MED OPEN (MISCELLANEOUS)
BENZOIN TINCTURE PRP APPL 2/3 (GAUZE/BANDAGES/DRESSINGS) ×3 IMPLANT
BLADE SURG 10 STRL SS (BLADE) ×9 IMPLANT
BLADE SURG ROTATE 9660 (MISCELLANEOUS) IMPLANT
CAGE COUGAR 16MM-5 LRG (Cage) ×3 IMPLANT
CLIP APPLIE 11 MED OPEN (CLIP) ×2 IMPLANT
CLIP APPLIE 9.375 MED OPEN (MISCELLANEOUS) IMPLANT
CLIP LIGATING EXTRA MED SLVR (CLIP) IMPLANT
CLIP LIGATING EXTRA SM BLUE (MISCELLANEOUS) IMPLANT
CORDS BIPOLAR (ELECTRODE) ×3 IMPLANT
COVER MAYO STAND STRL (DRAPES) ×3 IMPLANT
COVER SURGICAL LIGHT HANDLE (MISCELLANEOUS) ×3 IMPLANT
DERMABOND ADVANCED (GAUZE/BANDAGES/DRESSINGS) ×1
DERMABOND ADVANCED .7 DNX12 (GAUZE/BANDAGES/DRESSINGS) ×2 IMPLANT
DRAPE C-ARM 42X72 X-RAY (DRAPES) ×3 IMPLANT
DRAPE INCISE IOBAN 66X45 STRL (DRAPES) IMPLANT
DRAPE POUCH INSTRU U-SHP 10X18 (DRAPES) ×3 IMPLANT
DRAPE SURG 17X23 STRL (DRAPES) ×9 IMPLANT
DRSG MEPILEX BORDER 4X12 (GAUZE/BANDAGES/DRESSINGS) ×3 IMPLANT
DURAPREP 26ML APPLICATOR (WOUND CARE) ×3 IMPLANT
ELECT BLADE 4.0 EZ CLEAN MEGAD (MISCELLANEOUS) ×3
ELECT CAUTERY BLADE 6.4 (BLADE) ×3 IMPLANT
ELECT REM PT RETURN 9FT ADLT (ELECTROSURGICAL) ×3
ELECTRODE BLDE 4.0 EZ CLN MEGD (MISCELLANEOUS) ×2 IMPLANT
ELECTRODE REM PT RTRN 9FT ADLT (ELECTROSURGICAL) ×2 IMPLANT
GAUZE SPONGE 4X4 12PLY STRL (GAUZE/BANDAGES/DRESSINGS) ×3 IMPLANT
GAUZE SPONGE 4X4 16PLY XRAY LF (GAUZE/BANDAGES/DRESSINGS) ×3 IMPLANT
GLOVE BIO SURGEON STRL SZ7 (GLOVE) ×3 IMPLANT
GLOVE BIO SURGEON STRL SZ8 (GLOVE) ×6 IMPLANT
GLOVE BIOGEL PI IND STRL 6.5 (GLOVE) ×6 IMPLANT
GLOVE BIOGEL PI IND STRL 7.0 (GLOVE) ×2 IMPLANT
GLOVE BIOGEL PI IND STRL 8 (GLOVE) ×4 IMPLANT
GLOVE BIOGEL PI INDICATOR 6.5 (GLOVE) ×3
GLOVE BIOGEL PI INDICATOR 7.0 (GLOVE) ×1
GLOVE BIOGEL PI INDICATOR 8 (GLOVE) ×2
GLOVE BIOGEL PI ORTHO PRO SZ7 (GLOVE) ×1
GLOVE PI ORTHO PRO STRL SZ7 (GLOVE) ×2 IMPLANT
GLOVE SS BIOGEL STRL SZ 7.5 (GLOVE) ×4 IMPLANT
GLOVE SUPERSENSE BIOGEL SZ 7.5 (GLOVE) ×2
GLOVE SURG SS PI 7.0 STRL IVOR (GLOVE) ×9 IMPLANT
GOWN STRL REUS W/ TWL LRG LVL3 (GOWN DISPOSABLE) ×8 IMPLANT
GOWN STRL REUS W/ TWL XL LVL3 (GOWN DISPOSABLE) ×6 IMPLANT
GOWN STRL REUS W/TWL LRG LVL3 (GOWN DISPOSABLE) ×4
GOWN STRL REUS W/TWL XL LVL3 (GOWN DISPOSABLE) ×3
HEMOSTAT SURGICEL 2X14 (HEMOSTASIS) IMPLANT
INSERT FOGARTY 61MM (MISCELLANEOUS) IMPLANT
INSERT FOGARTY SM (MISCELLANEOUS) IMPLANT
KIT BASIN OR (CUSTOM PROCEDURE TRAY) ×3 IMPLANT
KIT ROOM TURNOVER OR (KITS) ×3 IMPLANT
LOOP VESSEL MAXI BLUE (MISCELLANEOUS) IMPLANT
LOOP VESSEL MINI RED (MISCELLANEOUS) IMPLANT
MIX DBX 10CC 35% BONE (Bone Implant) ×3 IMPLANT
NEEDLE HYPO 25GX1X1/2 BEV (NEEDLE) IMPLANT
NEEDLE SPNL 18GX3.5 QUINCKE PK (NEEDLE) ×3 IMPLANT
NS IRRIG 1000ML POUR BTL (IV SOLUTION) ×3 IMPLANT
PACK LAMINECTOMY ORTHO (CUSTOM PROCEDURE TRAY) ×3 IMPLANT
PACK UNIVERSAL I (CUSTOM PROCEDURE TRAY) ×3 IMPLANT
PAD ARMBOARD 7.5X6 YLW CONV (MISCELLANEOUS) ×6 IMPLANT
PATTIES SURGICAL .5 X1 (DISPOSABLE) ×3 IMPLANT
PIN FIXATION AEGIS THREADED (PIN) ×3 IMPLANT
PLATE AEGIS LUMBAR 21MM (Plate) ×3 IMPLANT
PUTTY BONE DBX 2.5 MIS (Bone Implant) ×3 IMPLANT
SCREW AEGIS 24MM (Screw) ×12 IMPLANT
SPONGE INTESTINAL PEANUT (DISPOSABLE) ×6 IMPLANT
SPONGE LAP 18X18 X RAY DECT (DISPOSABLE) ×6 IMPLANT
SPONGE LAP 4X18 X RAY DECT (DISPOSABLE) ×3 IMPLANT
SPONGE SURGIFOAM ABS GEL 100 (HEMOSTASIS) IMPLANT
SPONGE SURGIFOAM ABS GEL SZ50 (HEMOSTASIS) ×3 IMPLANT
STAPLER VISISTAT 35W (STAPLE) IMPLANT
STRIP CLOSURE SKIN 1/2X4 (GAUZE/BANDAGES/DRESSINGS) ×3 IMPLANT
SURGIFLO TRUKIT (HEMOSTASIS) IMPLANT
SUT MNCRL AB 4-0 PS2 18 (SUTURE) ×3 IMPLANT
SUT PDS AB 1 CTX 36 (SUTURE) ×3 IMPLANT
SUT PROLENE 4 0 RB 1 (SUTURE)
SUT PROLENE 4-0 RB1 .5 CRCL 36 (SUTURE) IMPLANT
SUT PROLENE 5 0 C 1 24 (SUTURE) IMPLANT
SUT PROLENE 5 0 CC1 (SUTURE) IMPLANT
SUT PROLENE 6 0 C 1 30 (SUTURE) IMPLANT
SUT PROLENE 6 0 CC (SUTURE) IMPLANT
SUT SILK 0 TIES 10X30 (SUTURE) IMPLANT
SUT SILK 2 0 TIES 10X30 (SUTURE) ×3 IMPLANT
SUT SILK 2 0SH CR/8 30 (SUTURE) IMPLANT
SUT SILK 3 0 TIES 10X30 (SUTURE) ×3 IMPLANT
SUT SILK 3 0SH CR/8 30 (SUTURE) IMPLANT
SUT VIC AB 0 CT1 27 (SUTURE)
SUT VIC AB 0 CT1 27XBRD ANBCTR (SUTURE) IMPLANT
SUT VIC AB 1 CT1 27 (SUTURE)
SUT VIC AB 1 CT1 27XBRD ANBCTR (SUTURE) IMPLANT
SUT VIC AB 1 CTX 36 (SUTURE)
SUT VIC AB 1 CTX36XBRD ANBCTR (SUTURE) IMPLANT
SUT VIC AB 2-0 CT1 36 (SUTURE) IMPLANT
SUT VIC AB 2-0 CT2 18 VCP726D (SUTURE) ×3 IMPLANT
SUT VIC AB 3-0 SH 27 (SUTURE)
SUT VIC AB 3-0 SH 27X BRD (SUTURE) IMPLANT
SYR BULB IRRIGATION 50ML (SYRINGE) ×3 IMPLANT
THROMBIN 20,000 UNITS, 20 ML IMPLANT
TOWEL OR 17X24 6PK STRL BLUE (TOWEL DISPOSABLE) ×6 IMPLANT
TOWEL OR 17X26 10 PK STRL BLUE (TOWEL DISPOSABLE) ×6 IMPLANT
TRAY FOLEY CATH 16FR SILVER (SET/KITS/TRAYS/PACK) ×3 IMPLANT
YANKAUER SUCT BULB TIP NO VENT (SUCTIONS) ×3 IMPLANT

## 2014-08-29 NOTE — Op Note (Signed)
    OPERATIVE REPORT  DATE OF SURGERY: 08/29/2014  PATIENT: Ellen Henry, 68 y.o. female MRN: [id number]  DOB: 1946-05-27  PRE-OPERATIVE DIAGNOSIS: L4-5 degenerative disc disease  POST-OPERATIVE DIAGNOSIS:  Same  PROCEDURE: Anterior exposure for L4-5 disc surgery  SURGEON:  Curt Jews, M.D.  Co-surgeon for the exposure Dr. Phylliss Bob  Asst. Pricilla Holm  ANESTHESIA:  Gen.  EBL: 100 ml  Total I/O In: 3185 [I.V.:2500; Blood:185; IV Piggyback:500] Out: 945 [Urine:375; Blood:570]  BLOOD ADMINISTERED: None  DRAINS: None   COUNTS CORRECT:  YES  PLAN OF CARE: To PACU stable   PATIENT DISPOSITION:  PACU - hemodynamically stable  PROCEDURE DETAILS: The patient was placed in supine position where the in sterile fashion. Crosstable lateral C-arm was used to identify the level of L4-5 disc on the skin. An incision was made from the midline laterally to the left this was carried down through the subcutaneous tissue with electrocautery. The patient is morbidly obese with a large amount of subcutaneous fat. The anterior rectus sheath was identified and the fracture was opened with electrocautery in line with the skin incision. The rectus muscle was mobilized. The retroperitoneum was entered laterally. Patient had a very friable peritoneal lining and the peritoneum was entered. This was the essentially the entire length of the incision for the chance for a herniation of bowel was noted in this. The exposure was continued by this dissecting in the right peritoneal space above the level of the psoas muscle through the vertebral bodies. Patient had a relatively large vertebral artery at this level as well as a doubled clipped and ligated. Blunt mobilization over the L4-5 disc was continued to the right lateral position. The iliac vein was identified and mobilized. The iliolumbar vein was ligated with 2-0 silk ties and divided. The shaver continued mobilization. The Thompson retractor was  brought onto the field and the reversal at 200 blades were positioned to the right and left of the 45 disc. Malleable blades were positioned for superior and inferior exposure. C-arm was brought onto the field and the needle was positioned the L4-5 disc and this was confirmed with fluoroscopy.  The patient then underwent disc replacement with Dr.Dumonski  At the completion of this the retractors removed allowing the intra-abdominal contents to return to her usual location. The fascia was closed with a #1 PDS suture beginning at the level of the linea alba extending laterally and also over the left lateral wall of the anterior rectus sheath and this was closed with 0 PDS suture as well. The patient should skin were closed in the usual fashion   Curt Jews, M.D. 08/29/2014 2:36 PM

## 2014-08-29 NOTE — Transfer of Care (Signed)
Immediate Anesthesia Transfer of Care Note  Patient: Ellen Henry  Procedure(s) Performed: Procedure(s) with comments: ANTERIOR LUMBAR FUSION 1 LEVEL (N/A) - Lumbar 4-5 anterior lumbar interbody fusion with allograft and instrumentation. ABDOMINAL EXPOSURE (N/A)  Patient Location: PACU  Anesthesia Type:General  Level of Consciousness: awake, alert  and oriented  Airway & Oxygen Therapy: Patient Spontanous Breathing and Patient connected to nasal cannula oxygen  Post-op Assessment: Report given to PACU RN and Post -op Vital signs reviewed and stable  Post vital signs: Reviewed and stable  Complications: No apparent anesthesia complications

## 2014-08-29 NOTE — H&P (Signed)
     PREOPERATIVE H&P  Chief Complaint: BILATERAL LEG PAIN   HPI: Ellen Henry is a 68 y.o. female who presents with ongoing pain in the bilateral legs  MRI reveals stenosis at L4/5, xrays reveal a spondylolisthesis at L4/5  Patient has failed multiple forms of conservative care and continues to have pain (see office notes for additional details regarding the patient's full course of treatment)  Past Medical History  Diagnosis Date  . Hypertension   . Arthritis   . Gout    Past Surgical History  Procedure Laterality Date  . 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 Appendectomy    . Tonsillectomy    . Joint replacement    . Abdominal hysterectomy    . Carpal tunnel release Right     release done twice   History   Social History  . Marital Status: Widowed    Spouse Name: N/A    Number of Children: N/A  . Years of Education: N/A   Social History Main Topics  . Smoking status: Current Some Day Smoker -- 0.25 packs/day for 5 years    Types: Cigarettes  . Smokeless tobacco: Never Used  . Alcohol Use: No  . Drug Use: No  . Sexual Activity: No   Other Topics Concern  . None   Social History Narrative  . None   Family History  Problem Relation Age of Onset  . Varicose Veins Mother    Allergies  Allergen Reactions  . Food Allergy Formula Other (See Comments)    Shellfish-banana-beef-flares up her gout   Prior to Admission medications   Medication Sig Start Date End Date Taking? Authorizing Provider  gabapentin (NEURONTIN) 600 MG tablet Take 600 mg by mouth 3 (three) times daily.   Yes Historical Provider, MD  Olmesartan-Amlodipine-HCTZ (TRIBENZOR) 20-5-12.5 MG TABS Take 1 tablet by mouth daily.   Yes Historical Provider, MD  traMADol (ULTRAM) 50 MG tablet Take 100 mg by mouth every 8 (eight) hours as needed (pain).   Yes Historical Provider, MD  colchicine 0.6 MG tablet Take 0.6 mg by mouth daily as needed. For gout    Historical Provider, MD      All other systems have been reviewed and were otherwise negative with the exception of those mentioned in the HPI and as above.  Physical Exam: Filed Vitals:   08/29/14 0645  BP: 88/52  Pulse: 71  Temp: 98.4 F (36.9 C)  Resp: 18    General: Alert, no acute distress Cardiovascular: No pedal edema Respiratory: No cyanosis, no use of accessory musculature Skin: No lesions in the area of chief complaint Neurologic: Sensation intact distally Psychiatric: Patient is competent for consent with normal mood and affect Lymphatic: No axillary or cervical lymphadenopathy   Assessment/Plan: Bilateral leg pain Plan for Procedure(s): stage 1: ANTERIOR LUMBAR FUSION 1 LEVEL Stage 2: POSTERIOR SPINAL FUSION WITH INSTRUMENTATION   Sinclair Ship, MD 08/29/2014 8:04 AM

## 2014-08-29 NOTE — Anesthesia Postprocedure Evaluation (Signed)
  Anesthesia Post-op Note  Patient: Ellen Henry  Procedure(s) Performed: Procedure(s) with comments: ANTERIOR LUMBAR FUSION 1 LEVEL (N/A) - Lumbar 4-5 anterior lumbar interbody fusion with allograft and instrumentation. ABDOMINAL EXPOSURE (N/A)  Patient Location: PACU  Anesthesia Type:General  Level of Consciousness: awake, alert  and oriented  Airway and Oxygen Therapy: Patient Spontanous Breathing  Post-op Pain: mild  Post-op Assessment: Post-op Vital signs reviewed  Post-op Vital Signs: Reviewed  Last Vitals:  Filed Vitals:   08/29/14 1633  BP:   Pulse:   Temp:   Resp: 16    Complications: No apparent anesthesia complications

## 2014-08-29 NOTE — Progress Notes (Signed)
Utilization review completed.  

## 2014-08-29 NOTE — Op Note (Signed)
Ellen Henry, Ellen Henry              ACCOUNT NO.:  [id number]  MEDICAL RECORD NO.:  [id number]  LOCATION:  [id number]                        FACILITY:  [facility]  PHYSICIAN:  Phylliss Bob, MD      DATE OF BIRTH:  Sep 16, 1946  DATE OF PROCEDURE:  08/29/2014                              OPERATIVE REPORT   PREOPERATIVE DIAGNOSES: 1. L4-5 spinal stenosis. 2. Bilateral lumbar radiculopathy. 3. L4-5 spondylolisthesis.  POSTOPERATIVE DIAGNOSES: 1. L4-5 spinal stenosis. 2. Bilateral lumbar radiculopathy. 3. L4-5 spondylolisthesis.  PROCEDURE: 1. Anterior lumbar interbody fusion L4-5. 2. Insertion of interbody device x1 (16 mm Kruger intervertebral cage,     5 degrees of lordosis). 3. Placement of anterior instrumentation, L4-5. 4. Use of morselized allograft - DBX mix. 5. Intraoperative use of fluoroscopy.  SURGEON:  Phylliss Bob, MD.  ASSISTANTPricilla Holm, PA-C  ANESTHESIA:  General endotracheal anesthesia.  COMPLICATIONS:  None.  DISPOSITION:  Stable.  ESTIMATED BLOOD LOSS:  300 mL.  INDICATIONS FOR SURGERY:  Briefly, Ellen Henry is a pleasant 68 year old female, who has been struggling with lumbar radiculopathy.  An MRI did reveal moderate to severe stenosis at L4-5.  The patient did fail conservative care, and we did elect to proceed with a 2 staged procedure, with the first stage involving an anterior lumbar fusion as noted above.  The patient did elect to proceed after a full understanding of the risks and benefits of surgery.  DESCRIPTION OF PROCEDURE:  On August 29, 2014, the patient was brought to surgery and general endotracheal anesthesia was administered. The patient was placed supine on a hospital bed.  All bony prominences were meticulously padded.  The abdomen was prepped and draped in the usual fashion and a time-out procedure was performed.  An anterior exposure was then performed.  An anterior retroperitoneal approach was performed by Dr. Curt Henry.  I did  function as first assistant for the exposure.  He was able to safely approach the anterior lumbar spine.  He did place the tractors in the appropriate positions.  Once the L4-5 intervertebral space was identified, which was confirmed using fluoroscopy, I did proceed with a standard diskectomy.  The entire disk was removed from the anterior to the posterior annulus.  Minor bleeding was encountered at the left aspect of the epidural space, which was controlled using Surgiflo.  The endplates were appropriately prepared. I then placed a series of interbody spacer trials and I did feel that a 16 mm trial would be the most appropriate fit.  The appropriate size interbody spacer was then packed with DBX mix and tamped into position in the usual fashion.  A 21 mm anterior lumbar plate was placed over the anterior lumbar spine.  Vertebral body screws were placed through the plate, 2 in each vertebral body at L4 and L5 for a total of 4 vertebral body screws.  The screws were then locked to the plate using the CAM locking mechanism.  I was very pleased with the AP and lateral fluoroscopic images.  I was very pleased with the restoration of intervertebral disk height across the L4-5 space.  The wound was then copiously irrigated.  An AP radiograph did confirm that there was no  retained instrumentation.  The fascia was then closed by Dr. Sherren Mocha Henry who did perform the approach.  Subsequently, the subcutaneous layer and the skin was closed using 2-0 Vicryl followed by 3-0 Monocryl.  Benzoin and Steri-Strips were applied followed by sterile dressing.  All instrument counts were correct at the termination of the procedure.  Of note, the plan is for the patient to return to surgery for stage II, which will involve a posterior fusion with instrumentation and a possible L4-5 decompression.     Phylliss Bob, MD     MD/MEDQ  D:  08/29/2014  T:  08/29/2014  Job:  [id number]

## 2014-08-29 NOTE — Anesthesia Preprocedure Evaluation (Addendum)
Anesthesia Evaluation  Patient identified by MRN, date of birth, ID band Patient awake    Reviewed: Allergy & Precautions, H&P , NPO status , Patient's Chart, lab work & pertinent test results  Airway Mallampati: III TM Distance: >3 FB Neck ROM: Full    Dental  (+) Edentulous Upper, Edentulous Lower   Pulmonary Current Smoker,  breath sounds clear to auscultation        Cardiovascular hypertension, Pt. on medications Rhythm:Regular Rate:Normal     Neuro/Psych negative neurological ROS  negative psych ROS   GI/Hepatic negative GI ROS, Neg liver ROS,   Endo/Other  Morbid obesity  Renal/GU negative Renal ROS     Musculoskeletal  (+) Arthritis -,   Abdominal   Peds  Hematology negative hematology ROS (+)   Anesthesia Other Findings   Reproductive/Obstetrics negative OB ROS                         Anesthesia Physical Anesthesia Plan  ASA: III  Anesthesia Plan: General   Post-op Pain Management:    Induction: Intravenous  Airway Management Planned: Oral ETT  Additional Equipment:   Intra-op Plan:   Post-operative Plan: Extubation in OR  Informed Consent: I have reviewed the patients History and Physical, chart, labs and discussed the procedure including the risks, benefits and alternatives for the proposed anesthesia with the patient or authorized representative who has indicated his/her understanding and acceptance.   Dental advisory given  Plan Discussed with: CRNA, Anesthesiologist and Surgeon  Anesthesia Plan Comments:         Anesthesia Quick Evaluation

## 2014-08-29 NOTE — Anesthesia Procedure Notes (Signed)
Procedure Name: Intubation Date/Time: 08/29/2014 8:56 AM Performed by: Eligha Bridegroom Pre-anesthesia Checklist: Emergency Drugs available, Patient identified, Timeout performed, Suction available and Patient being monitored Patient Re-evaluated:Patient Re-evaluated prior to inductionOxygen Delivery Method: Circle system utilized Preoxygenation: Pre-oxygenation with 100% oxygen Intubation Type: IV induction Ventilation: Mask ventilation without difficulty and Oral airway inserted - appropriate to patient size Laryngoscope Size: Mac and 4 Grade View: Grade I Tube type: Oral Tube size: 7.5 mm Airway Equipment and Method: Stylet Placement Confirmation: ETT inserted through vocal cords under direct vision,  positive ETCO2 and breath sounds checked- equal and bilateral Secured at: 21 cm Tube secured with: Tape Dental Injury: Teeth and Oropharynx as per pre-operative assessment

## 2014-08-30 ENCOUNTER — Inpatient Hospital Stay (HOSPITAL_COMMUNITY): Payer: Medicare Other | Admitting: Anesthesiology

## 2014-08-30 ENCOUNTER — Encounter (HOSPITAL_COMMUNITY)
Admission: RE | Disposition: A | Discharge status: Skilled Nursing Facility | Payer: Medicare Other | Source: Ambulatory Visit | Attending: Orthopedic Surgery

## 2014-08-30 ENCOUNTER — Inpatient Hospital Stay (HOSPITAL_COMMUNITY): Payer: Medicare Other

## 2014-08-30 ENCOUNTER — Encounter (HOSPITAL_COMMUNITY): Payer: Self-pay | Admitting: Anesthesiology

## 2014-08-30 ENCOUNTER — Encounter (HOSPITAL_COMMUNITY): Payer: Medicare Other | Admitting: Anesthesiology

## 2014-08-30 ENCOUNTER — Inpatient Hospital Stay (HOSPITAL_COMMUNITY): Admit: 2014-08-30 | Payer: Medicare Other | Admitting: Orthopedic Surgery

## 2014-08-30 SURGERY — POSTERIOR LUMBAR FUSION 1 LEVEL
Anesthesia: General

## 2014-08-30 MED ORDER — ONDANSETRON HCL 4 MG/2ML IJ SOLN: 4.0000 mg | Freq: Four times a day (QID) | INTRAMUSCULAR | Status: DC | PRN

## 2014-08-30 MED ORDER — SODIUM CHLORIDE 0.9 % IJ SOLN
3.0000 mL | Freq: Two times a day (BID) | INTRAMUSCULAR | Status: DC
  Administered 2014-08-30: 3 mL via INTRAVENOUS

## 2014-08-30 MED ORDER — LIDOCAINE HCL (CARDIAC) 20 MG/ML IV SOLN
INTRAVENOUS | Status: DC | PRN
  Administered 2014-08-30: 90 mg via INTRAVENOUS

## 2014-08-30 MED ORDER — PROPOFOL 10 MG/ML IV EMUL
INTRAVENOUS | Status: AC
  Filled 2014-08-30: qty 200

## 2014-08-30 MED ORDER — ROCURONIUM BROMIDE 50 MG/5ML IV SOLN
INTRAVENOUS | Status: AC
  Filled 2014-08-30: qty 1

## 2014-08-30 MED ORDER — THROMBIN 20000 UNITS EX SOLR
CUTANEOUS | Status: DC | PRN
  Administered 2014-08-30: 09:00:00 via TOPICAL

## 2014-08-30 MED ORDER — GLYCOPYRROLATE 0.2 MG/ML IJ SOLN
INTRAMUSCULAR | Status: DC | PRN
  Administered 2014-08-30: .8 mg via INTRAVENOUS

## 2014-08-30 MED ORDER — LACTATED RINGERS IV SOLN
INTRAVENOUS | Status: DC | PRN
  Administered 2014-08-30: 07:00:00 via INTRAVENOUS

## 2014-08-30 MED ORDER — OXYCODONE HCL 5 MG PO TABS
ORAL_TABLET | ORAL | Status: AC
  Filled 2014-08-30: qty 1

## 2014-08-30 MED ORDER — CEFAZOLIN SODIUM 1-5 GM-% IV SOLN
1.0000 g | Freq: Three times a day (TID) | INTRAVENOUS | Status: AC
  Administered 2014-08-30 (×2): 1 g via INTRAVENOUS
  Filled 2014-08-30 (×2): qty 50

## 2014-08-30 MED ORDER — PROPRANOLOL HCL 1 MG/ML IV SOLN
INTRAVENOUS | Status: AC
  Filled 2014-08-30: qty 1

## 2014-08-30 MED ORDER — BUPIVACAINE-EPINEPHRINE 0.25% -1:200000 IJ SOLN
INTRAMUSCULAR | Status: DC | PRN
  Administered 2014-08-30: 3 mL

## 2014-08-30 MED ORDER — PROPOFOL 10 MG/ML IV BOLUS
INTRAVENOUS | Status: DC | PRN
  Administered 2014-08-30: 100 mg via INTRAVENOUS

## 2014-08-30 MED ORDER — FENTANYL CITRATE 0.05 MG/ML IJ SOLN
INTRAMUSCULAR | Status: AC
  Filled 2014-08-30: qty 5

## 2014-08-30 MED ORDER — PHENYLEPHRINE 40 MCG/ML (10ML) SYRINGE FOR IV PUSH (FOR BLOOD PRESSURE SUPPORT)
PREFILLED_SYRINGE | INTRAVENOUS | Status: AC
  Filled 2014-08-30: qty 10

## 2014-08-30 MED ORDER — CEFAZOLIN SODIUM-DEXTROSE 2-3 GM-% IV SOLR
INTRAVENOUS | Status: AC
  Filled 2014-08-30: qty 50

## 2014-08-30 MED ORDER — ROCURONIUM BROMIDE 100 MG/10ML IV SOLN
INTRAVENOUS | Status: DC | PRN
  Administered 2014-08-30: 50 mg via INTRAVENOUS

## 2014-08-30 MED ORDER — LACTATED RINGERS IV SOLN
INTRAVENOUS | Status: DC | PRN
  Administered 2014-08-30 (×2): via INTRAVENOUS

## 2014-08-30 MED ORDER — BUPIVACAINE-EPINEPHRINE (PF) 0.25% -1:200000 IJ SOLN
INTRAMUSCULAR | Status: AC
Start: 2014-08-30 — End: 2014-08-30
  Filled 2014-08-30: qty 30

## 2014-08-30 MED ORDER — SODIUM CHLORIDE 0.9 % IJ SOLN
3.0000 mL | INTRAMUSCULAR | Status: DC | PRN
Start: 2014-08-30 — End: 2014-08-31

## 2014-08-30 MED ORDER — MIDAZOLAM HCL 2 MG/2ML IJ SOLN
INTRAMUSCULAR | Status: AC
  Filled 2014-08-30: qty 2

## 2014-08-30 MED ORDER — SUCCINYLCHOLINE CHLORIDE 20 MG/ML IJ SOLN
INTRAMUSCULAR | Status: DC | PRN
  Administered 2014-08-30: 100 mg via INTRAVENOUS

## 2014-08-30 MED ORDER — NEOSTIGMINE METHYLSULFATE 10 MG/10ML IV SOLN
INTRAVENOUS | Status: DC | PRN
  Administered 2014-08-30: 5 mg via INTRAVENOUS

## 2014-08-30 MED ORDER — HYDROMORPHONE HCL 1 MG/ML IJ SOLN
0.2500 mg | INTRAMUSCULAR | Status: DC | PRN
  Administered 2014-08-30 (×4): 0.25 mg via INTRAVENOUS

## 2014-08-30 MED ORDER — SODIUM CHLORIDE 0.9 % IV SOLN: 250.0000 mL | INTRAVENOUS | Status: DC

## 2014-08-30 MED ORDER — PHENYLEPHRINE HCL 10 MG/ML IJ SOLN
INTRAMUSCULAR | Status: DC | PRN
  Administered 2014-08-30: 80 ug via INTRAVENOUS

## 2014-08-30 MED ORDER — LIDOCAINE HCL (CARDIAC) 20 MG/ML IV SOLN
INTRAVENOUS | Status: AC
  Filled 2014-08-30: qty 5

## 2014-08-30 MED ORDER — ONDANSETRON HCL 4 MG/2ML IJ SOLN
INTRAMUSCULAR | Status: AC
  Filled 2014-08-30: qty 2

## 2014-08-30 MED ORDER — PHENYLEPHRINE HCL 10 MG/ML IJ SOLN
10.0000 mg | INTRAVENOUS | Status: DC | PRN
  Administered 2014-08-30: 25 ug/min via INTRAVENOUS

## 2014-08-30 MED ORDER — ONDANSETRON HCL 4 MG/2ML IJ SOLN
4.0000 mg | INTRAMUSCULAR | Status: DC | PRN
  Administered 2014-08-31: 4 mg via INTRAVENOUS

## 2014-08-30 MED ORDER — ONDANSETRON HCL 4 MG/2ML IJ SOLN
INTRAMUSCULAR | Status: DC | PRN
  Administered 2014-08-30: 4 mg via INTRAVENOUS

## 2014-08-30 MED ORDER — PROPOFOL 10 MG/ML IV BOLUS
INTRAVENOUS | Status: AC
  Filled 2014-08-30: qty 20

## 2014-08-30 MED ORDER — SODIUM CHLORIDE 0.9 % IV SOLN
INTRAVENOUS | Status: DC
  Administered 2014-08-30 – 2014-09-01 (×4): via INTRAVENOUS

## 2014-08-30 MED ORDER — OXYCODONE HCL 5 MG PO TABS
5.0000 mg | ORAL_TABLET | Freq: Once | ORAL | Status: AC | PRN
  Administered 2014-08-30: 5 mg via ORAL

## 2014-08-30 MED ORDER — 0.9 % SODIUM CHLORIDE (POUR BTL) OPTIME
TOPICAL | Status: DC | PRN
  Administered 2014-08-30: 1000 mL

## 2014-08-30 MED ORDER — PROPOFOL 10 MG/ML IV EMUL
INTRAVENOUS | Status: AC
  Filled 2014-08-30: qty 100

## 2014-08-30 MED ORDER — TRAMADOL HCL 50 MG PO TABS
100.0000 mg | ORAL_TABLET | Freq: Four times a day (QID) | ORAL | Status: DC | PRN
  Administered 2014-08-30 – 2014-08-31 (×2): 100 mg via ORAL
  Filled 2014-08-30 (×2): qty 2

## 2014-08-30 MED ORDER — METHYLENE BLUE 1 % INJ SOLN
INTRAMUSCULAR | Status: AC
  Filled 2014-08-30: qty 10

## 2014-08-30 MED ORDER — THROMBIN 20000 UNITS EX SOLR
CUTANEOUS | Status: AC
  Filled 2014-08-30: qty 20000

## 2014-08-30 MED ORDER — HYDROMORPHONE HCL 1 MG/ML IJ SOLN
INTRAMUSCULAR | Status: AC
  Administered 2014-08-31: 1 mg via INTRAVENOUS
  Filled 2014-08-30: qty 1

## 2014-08-30 MED ORDER — GLYCOPYRROLATE 0.2 MG/ML IJ SOLN
INTRAMUSCULAR | Status: AC
  Filled 2014-08-30: qty 4

## 2014-08-30 MED ORDER — NEOSTIGMINE METHYLSULFATE 10 MG/10ML IV SOLN
INTRAVENOUS | Status: AC
  Filled 2014-08-30: qty 1

## 2014-08-30 MED ORDER — MIDAZOLAM HCL 5 MG/5ML IJ SOLN
INTRAMUSCULAR | Status: DC | PRN
  Administered 2014-08-30: 2 mg via INTRAVENOUS

## 2014-08-30 MED ORDER — MORPHINE SULFATE (PF) 1 MG/ML IV SOLN
INTRAVENOUS | Status: AC
  Filled 2014-08-30: qty 25

## 2014-08-30 MED ORDER — OXYCODONE HCL 5 MG/5ML PO SOLN: 5.0000 mg | Freq: Once | ORAL | Status: AC | PRN

## 2014-08-30 MED ORDER — PROPOFOL INFUSION 10 MG/ML OPTIME
INTRAVENOUS | Status: DC | PRN
  Administered 2014-08-30: 75 ug/kg/min via INTRAVENOUS

## 2014-08-30 MED ORDER — CEFAZOLIN SODIUM-DEXTROSE 2-3 GM-% IV SOLR
INTRAVENOUS | Status: DC | PRN
  Administered 2014-08-30: 2 g via INTRAVENOUS

## 2014-08-30 MED ORDER — FENTANYL CITRATE 0.05 MG/ML IJ SOLN
INTRAMUSCULAR | Status: DC | PRN
  Administered 2014-08-30 (×6): 50 ug via INTRAVENOUS

## 2014-08-30 MED FILL — Thrombin For Soln 20000 Unit: CUTANEOUS | Qty: 1 | Status: AC

## 2014-08-30 MED FILL — Sodium Chloride IV Soln 0.9%: INTRAVENOUS | Qty: 1000 | Status: AC

## 2014-08-30 MED FILL — Sodium Chloride Irrigation Soln 0.9%: Qty: 3000 | Status: AC

## 2014-08-30 MED FILL — Heparin Sodium (Porcine) Inj 1000 Unit/ML: INTRAMUSCULAR | Qty: 30 | Status: AC

## 2014-08-30 SURGICAL SUPPLY — 86 items
BENZOIN TINCTURE PRP APPL 2/3 (GAUZE/BANDAGES/DRESSINGS) ×2 IMPLANT
BLADE SURG ROTATE 9660 (MISCELLANEOUS) IMPLANT
BUR ROUND PRECISION 4.0 (BURR) ×2 IMPLANT
CARTRIDGE OIL MAESTRO DRILL (MISCELLANEOUS) ×1 IMPLANT
CLSR STERI-STRIP ANTIMIC 1/2X4 (GAUZE/BANDAGES/DRESSINGS) ×2 IMPLANT
CONT SPEC STER OR (MISCELLANEOUS) ×2 IMPLANT
CORDS BIPOLAR (ELECTRODE) ×2 IMPLANT
COVER MAYO STAND STRL (DRAPES) ×4 IMPLANT
COVER SURGICAL LIGHT HANDLE (MISCELLANEOUS) ×2 IMPLANT
DIFFUSER DRILL AIR PNEUMATIC (MISCELLANEOUS) ×2 IMPLANT
DRAIN CHANNEL 15F RND FF W/TCR (WOUND CARE) IMPLANT
DRAPE C-ARM 42X72 X-RAY (DRAPES) ×2 IMPLANT
DRAPE ORTHO SPLIT 77X108 STRL (DRAPES) ×1
DRAPE POUCH INSTRU U-SHP 10X18 (DRAPES) ×2 IMPLANT
DRAPE SURG 17X23 STRL (DRAPES) ×6 IMPLANT
DRAPE SURG ORHT 6 SPLT 77X108 (DRAPES) ×1 IMPLANT
DURAPREP 26ML APPLICATOR (WOUND CARE) ×2 IMPLANT
ELECT BLADE 4.0 EZ CLEAN MEGAD (MISCELLANEOUS) ×2
ELECT CAUTERY BLADE 6.4 (BLADE) ×2 IMPLANT
ELECT REM PT RETURN 9FT ADLT (ELECTROSURGICAL) ×2
ELECTRODE BLDE 4.0 EZ CLN MEGD (MISCELLANEOUS) ×1 IMPLANT
ELECTRODE REM PT RTRN 9FT ADLT (ELECTROSURGICAL) ×1 IMPLANT
EVACUATOR SILICONE 100CC (DRAIN) IMPLANT
GAUZE SPONGE 4X4 12PLY STRL (GAUZE/BANDAGES/DRESSINGS) ×2 IMPLANT
GAUZE SPONGE 4X4 16PLY XRAY LF (GAUZE/BANDAGES/DRESSINGS) ×8 IMPLANT
GLOVE BIO SURGEON STRL SZ7 (GLOVE) ×2 IMPLANT
GLOVE BIO SURGEON STRL SZ8 (GLOVE) ×2 IMPLANT
GLOVE BIOGEL M 6.5 STRL (GLOVE) ×4 IMPLANT
GLOVE BIOGEL PI IND STRL 6.5 (GLOVE) ×1 IMPLANT
GLOVE BIOGEL PI IND STRL 7.0 (GLOVE) ×1 IMPLANT
GLOVE BIOGEL PI IND STRL 8 (GLOVE) ×2 IMPLANT
GLOVE BIOGEL PI INDICATOR 6.5 (GLOVE) ×1
GLOVE BIOGEL PI INDICATOR 7.0 (GLOVE) ×1
GLOVE BIOGEL PI INDICATOR 8 (GLOVE) ×2
GOWN STRL REUS W/ TWL LRG LVL3 (GOWN DISPOSABLE) ×2 IMPLANT
GOWN STRL REUS W/ TWL XL LVL3 (GOWN DISPOSABLE) ×1 IMPLANT
GOWN STRL REUS W/TWL LRG LVL3 (GOWN DISPOSABLE) ×2
GOWN STRL REUS W/TWL XL LVL3 (GOWN DISPOSABLE) ×1
GUIDEWIRE SHARP VIPER II (WIRE) ×4 IMPLANT
HEMOSTAT SURGICEL 2X14 (HEMOSTASIS) IMPLANT
IV CATH 14GX2 1/4 (CATHETERS) ×2 IMPLANT
KIT BASIN OR (CUSTOM PROCEDURE TRAY) ×2 IMPLANT
KIT POSITION SURG JACKSON T1 (MISCELLANEOUS) ×2 IMPLANT
KIT ROOM TURNOVER OR (KITS) ×2 IMPLANT
MARKER SKIN DUAL TIP RULER LAB (MISCELLANEOUS) ×2 IMPLANT
NDL SAFETY ECLIPSE 18X1.5 (NEEDLE) ×1 IMPLANT
NEEDLE 22X1 1/2 (OR ONLY) (NEEDLE) ×2 IMPLANT
NEEDLE BONE MARROW 8GX6 FENEST (NEEDLE) IMPLANT
NEEDLE HYPO 18GX1.5 SHARP (NEEDLE) ×1
NEEDLE HYPO 25GX1X1/2 BEV (NEEDLE) ×2 IMPLANT
NEEDLE JAMSHIDI VIPER (NEEDLE) ×4 IMPLANT
NEEDLE SPNL 18GX3.5 QUINCKE PK (NEEDLE) ×4 IMPLANT
NEURO MONITORING STIM (LABOR (TRAVEL & OVERTIME)) ×2 IMPLANT
NS IRRIG 1000ML POUR BTL (IV SOLUTION) ×6 IMPLANT
OIL CARTRIDGE MAESTRO DRILL (MISCELLANEOUS) ×2
PACK LAMINECTOMY ORTHO (CUSTOM PROCEDURE TRAY) ×2 IMPLANT
PACK UNIVERSAL I (CUSTOM PROCEDURE TRAY) ×2 IMPLANT
PAD ARMBOARD 7.5X6 YLW CONV (MISCELLANEOUS) ×12 IMPLANT
PATTIES SURGICAL .5 X1 (DISPOSABLE) IMPLANT
PATTIES SURGICAL .5X1.5 (GAUZE/BANDAGES/DRESSINGS) ×2 IMPLANT
ROD VIPER2 5.5X45 PRE LARDOSED (Rod) ×2 IMPLANT
SCREW SET SINGLE INNER MIS (Screw) ×4 IMPLANT
SCREW VIPER X-TAB 6.0X40 (Screw) ×4 IMPLANT
SPONGE GAUZE 4X4 12PLY STER LF (GAUZE/BANDAGES/DRESSINGS) ×2 IMPLANT
SPONGE INTESTINAL PEANUT (DISPOSABLE) ×2 IMPLANT
SPONGE SURGIFOAM ABS GEL 100 (HEMOSTASIS) ×2 IMPLANT
STRIP CLOSURE SKIN 1/2X4 (GAUZE/BANDAGES/DRESSINGS) ×4 IMPLANT
SURGIFLO TRUKIT (HEMOSTASIS) ×2 IMPLANT
SUT MNCRL AB 3-0 PS2 18 (SUTURE) ×2 IMPLANT
SUT MNCRL AB 4-0 PS2 18 (SUTURE) ×4 IMPLANT
SUT VIC AB 0 CT1 18XCR BRD 8 (SUTURE) ×1 IMPLANT
SUT VIC AB 0 CT1 8-18 (SUTURE) ×1
SUT VIC AB 1 CT1 18XCR BRD 8 (SUTURE) ×2 IMPLANT
SUT VIC AB 1 CT1 8-18 (SUTURE) ×2
SUT VIC AB 2-0 CT2 18 VCP726D (SUTURE) ×4 IMPLANT
SYR 20CC LL (SYRINGE) ×2 IMPLANT
SYR BULB IRRIGATION 50ML (SYRINGE) ×2 IMPLANT
SYR CONTROL 10ML LL (SYRINGE) ×4 IMPLANT
SYR TB 1ML LUER SLIP (SYRINGE) ×2 IMPLANT
TAP CANN VIPER2 DL 5.0 (TAP) ×2 IMPLANT
TAPE CLOTH SURG 4X10 WHT LF (GAUZE/BANDAGES/DRESSINGS) ×2 IMPLANT
TOWEL OR 17X24 6PK STRL BLUE (TOWEL DISPOSABLE) ×2 IMPLANT
TOWEL OR 17X26 10 PK STRL BLUE (TOWEL DISPOSABLE) ×2 IMPLANT
TRAY FOLEY CATH 16FRSI W/METER (SET/KITS/TRAYS/PACK) IMPLANT
WATER STERILE IRR 1000ML POUR (IV SOLUTION) IMPLANT
YANKAUER SUCT BULB TIP NO VENT (SUCTIONS) ×2 IMPLANT

## 2014-08-30 NOTE — H&P (Signed)
No change in patient's medical history  Patient s/p stage 1 of 2. Will proceed with stage to today (posterior lumbar decompression and fusion)

## 2014-08-30 NOTE — Progress Notes (Signed)
OT Cancellation Note  Patient Details Name: Ellen Henry MRN: [id number] DOB: 07/13/1946   Cancelled Treatment:    Reason Eval/Treat Not Completed: Patient not medically ready. Orders for OOB POD#1 and pt currently limited by high level of pain. OT will follow up as appropriate to complete evaluation.   Villa Herb M  Cyndie Chime, OTR/L Occupational Therapist [phone] (pager)  08/30/2014, 4:21 PM

## 2014-08-30 NOTE — Anesthesia Postprocedure Evaluation (Signed)
  Anesthesia Post-op Note  Patient: Ellen Henry  Procedure(s) Performed: Procedure(s) with comments: POSTERIOR LUMBAR FUSION 1 LEVEL (N/A) - Lumbar 4-5 decompression and fusion with instrumentation.  Patient Location: PACU  Anesthesia Type:General  Level of Consciousness: awake  Airway and Oxygen Therapy: Patient Spontanous Breathing  Post-op Pain: mild  Post-op Assessment: Post-op Vital signs reviewed  Post-op Vital Signs: Reviewed  Last Vitals:  Filed Vitals:   08/30/14 1345  BP:   Pulse: 77  Temp: 36.4 C  Resp: 7    Complications: No apparent anesthesia complications

## 2014-08-30 NOTE — Transfer of Care (Signed)
Immediate Anesthesia Transfer of Care Note  Patient: Ellen Henry  Procedure(s) Performed: Procedure(s) with comments: POSTERIOR LUMBAR FUSION 1 LEVEL (N/A) - Lumbar 4-5 decompression and fusion with allograft and instrumentation.  Patient Location: PACU  Anesthesia Type:General  Level of Consciousness: awake  Airway & Oxygen Therapy: Patient Spontanous Breathing and Patient connected to nasal cannula oxygen  Post-op Assessment: Report given to PACU RN and Post -op Vital signs reviewed and stable  Post vital signs: Reviewed and stable  Complications: No apparent anesthesia complications

## 2014-08-30 NOTE — Anesthesia Preprocedure Evaluation (Addendum)
Anesthesia Evaluation  Patient identified by MRN, date of birth, ID band Patient awake    Reviewed: Allergy & Precautions, H&P , NPO status , Patient's Chart, lab work & pertinent test results  Airway Mallampati: II  Neck ROM: full    Dental   Pulmonary Current Smoker,          Cardiovascular hypertension,     Neuro/Psych  Neuromuscular disease    GI/Hepatic   Endo/Other  Morbid obesity  Renal/GU      Musculoskeletal  (+) Arthritis -,   Abdominal   Peds  Hematology   Anesthesia Other Findings   Reproductive/Obstetrics                          Anesthesia Physical Anesthesia Plan  ASA: II  Anesthesia Plan: General   Post-op Pain Management:    Induction: Intravenous  Airway Management Planned: Oral ETT  Additional Equipment:   Intra-op Plan:   Post-operative Plan: Extubation in OR  Informed Consent: I have reviewed the patients History and Physical, chart, labs and discussed the procedure including the risks, benefits and alternatives for the proposed anesthesia with the patient or authorized representative who has indicated his/her understanding and acceptance.     Plan Discussed with: CRNA, Anesthesiologist and Surgeon  Anesthesia Plan Comments:         Anesthesia Quick Evaluation

## 2014-08-30 NOTE — Progress Notes (Signed)
Patient was receiving CHG bath to prepare for surgery when extreme stabbing pain shot through her right leg causing her to have labor breathing. O2 sats dropped into the 70's with a low of 69. Nasal cannula increased to 5L of O2. Patient began to calm down and nasal cannula was slowly reduced down to 2L O2 at 93-95%. Will continue to monitor. Call bell within reach.   Domingo Dimes RN

## 2014-08-31 ENCOUNTER — Inpatient Hospital Stay (HOSPITAL_COMMUNITY): Payer: Medicare Other

## 2014-08-31 ENCOUNTER — Encounter (HOSPITAL_COMMUNITY): Payer: Self-pay | Admitting: Orthopedic Surgery

## 2014-08-31 DIAGNOSIS — A419 Sepsis, unspecified organism: Secondary | ICD-10-CM

## 2014-08-31 DIAGNOSIS — IMO0002 Reserved for concepts with insufficient information to code with codable children: Secondary | ICD-10-CM

## 2014-08-31 DIAGNOSIS — R6521 Severe sepsis with septic shock: Secondary | ICD-10-CM

## 2014-08-31 DIAGNOSIS — T83511A Infection and inflammatory reaction due to indwelling urethral catheter, initial encounter: Secondary | ICD-10-CM

## 2014-08-31 DIAGNOSIS — N390 Urinary tract infection, site not specified: Secondary | ICD-10-CM

## 2014-08-31 DIAGNOSIS — R6510 Systemic inflammatory response syndrome (SIRS) of non-infectious origin without acute organ dysfunction: Secondary | ICD-10-CM

## 2014-08-31 DIAGNOSIS — I959 Hypotension, unspecified: Secondary | ICD-10-CM | POA: Diagnosis present

## 2014-08-31 LAB — URINALYSIS, ROUTINE W REFLEX MICROSCOPIC
Glucose, UA: NEGATIVE mg/dL
Glucose, UA: NEGATIVE mg/dL
Ketones, ur: 15 mg/dL — AB
Ketones, ur: 15 mg/dL — AB
Nitrite: NEGATIVE
Nitrite: NEGATIVE
Protein, ur: 30 mg/dL — AB
Protein, ur: NEGATIVE mg/dL
Specific Gravity, Urine: 1.026 (ref 1.005–1.030)
Specific Gravity, Urine: 1.02 (ref 1.005–1.030)
Urobilinogen, UA: 0.2 mg/dL (ref 0.0–1.0)
Urobilinogen, UA: 1 mg/dL (ref 0.0–1.0)
pH: 5 (ref 5.0–8.0)
pH: 5 (ref 5.0–8.0)

## 2014-08-31 LAB — BASIC METABOLIC PANEL
Anion gap: 11 (ref 5–15)
BUN: 17 mg/dL (ref 6–23)
CO2: 25 mEq/L (ref 19–32)
Calcium: 8.1 mg/dL — ABNORMAL LOW (ref 8.4–10.5)
Chloride: 102 mEq/L (ref 96–112)
Creatinine, Ser: 1.44 mg/dL — ABNORMAL HIGH (ref 0.50–1.10)
GFR calc Af Amer: 42 mL/min — ABNORMAL LOW (ref >90)
GFR calc non Af Amer: 36 mL/min — ABNORMAL LOW (ref >90)
Glucose, Bld: 91 mg/dL (ref 70–99)
Potassium: 4.6 mEq/L (ref 3.7–5.3)
Sodium: 138 mEq/L (ref 137–147)

## 2014-08-31 LAB — CBC
HCT: 36 % (ref 36.0–46.0)
Hemoglobin: 11.8 g/dL — ABNORMAL LOW (ref 12.0–15.0)
MCH: 27 pg (ref 26.0–34.0)
MCHC: 32.8 g/dL (ref 30.0–36.0)
MCV: 82.4 fL (ref 78.0–100.0)
Platelets: 125 10*3/uL — ABNORMAL LOW (ref 150–400)
RBC: 4.37 MIL/uL (ref 3.87–5.11)
RDW: 15.3 % (ref 11.5–15.5)
WBC: 11.3 10*3/uL — ABNORMAL HIGH (ref 4.0–10.5)

## 2014-08-31 LAB — URINE MICROSCOPIC-ADD ON

## 2014-08-31 LAB — CARBOXYHEMOGLOBIN
Carboxyhemoglobin: 2.1 % — ABNORMAL HIGH (ref 0.5–1.5)
Methemoglobin: 0.7 % (ref 0.0–1.5)
O2 Saturation: 36.6 %
Total hemoglobin: 9 g/dL — ABNORMAL LOW (ref 12.0–16.0)

## 2014-08-31 LAB — MRSA PCR SCREENING: MRSA by PCR: NEGATIVE

## 2014-08-31 LAB — LACTIC ACID, PLASMA: Lactic Acid, Venous: 1.6 mmol/L (ref 0.5–2.2)

## 2014-08-31 LAB — PROCALCITONIN: Procalcitonin: 2.4 ng/mL

## 2014-08-31 LAB — TROPONIN I: Troponin I: 2.69 ng/mL (ref <0.30)

## 2014-08-31 MED ORDER — VANCOMYCIN HCL 10 G IV SOLR
1500.0000 mg | INTRAVENOUS | Status: DC
  Administered 2014-09-01 – 2014-09-02 (×2): 1500 mg via INTRAVENOUS
  Filled 2014-08-31 (×2): qty 1500

## 2014-08-31 MED ORDER — HYDROMORPHONE HCL 1 MG/ML IJ SOLN
1.0000 mg | INTRAMUSCULAR | Status: DC | PRN
  Administered 2014-08-31 – 2014-09-01 (×4): 1 mg via INTRAVENOUS
  Filled 2014-08-31 (×4): qty 1

## 2014-08-31 MED ORDER — PNEUMOCOCCAL VAC POLYVALENT 25 MCG/0.5ML IJ INJ
0.5000 mL | INJECTION | INTRAMUSCULAR | Status: AC
  Administered 2014-09-01: 0.5 mL via INTRAMUSCULAR
  Filled 2014-08-31: qty 0.5

## 2014-08-31 MED ORDER — IBUPROFEN 400 MG PO TABS
400.0000 mg | ORAL_TABLET | Freq: Four times a day (QID) | ORAL | Status: DC | PRN
  Filled 2014-08-31: qty 1

## 2014-08-31 MED ORDER — SODIUM CHLORIDE 0.9 % IV BOLUS (SEPSIS)
500.0000 mL | Freq: Once | INTRAVENOUS | Status: AC
  Administered 2014-08-31: 500 mL via INTRAVENOUS

## 2014-08-31 MED ORDER — PHENYLEPHRINE HCL 10 MG/ML IJ SOLN
30.0000 ug/min | INTRAVENOUS | Status: DC
  Administered 2014-08-31 (×2): 50 ug/min via INTRAVENOUS
  Administered 2014-08-31 (×2): 35 ug/min via INTRAVENOUS
  Administered 2014-08-31: 80 ug/min via INTRAVENOUS
  Filled 2014-08-31 (×2): qty 4

## 2014-08-31 MED ORDER — INFLUENZA VAC SPLIT QUAD 0.5 ML IM SUSY
0.5000 mL | PREFILLED_SYRINGE | INTRAMUSCULAR | Status: AC
  Administered 2014-09-01: 0.5 mL via INTRAMUSCULAR
  Filled 2014-08-31: qty 0.5

## 2014-08-31 MED ORDER — ACETAMINOPHEN 325 MG PO TABS
650.0000 mg | ORAL_TABLET | Freq: Three times a day (TID) | ORAL | Status: DC
  Administered 2014-08-31 (×3): 650 mg via ORAL
  Filled 2014-08-31 (×2): qty 2

## 2014-08-31 MED ORDER — PHENYLEPHRINE HCL 10 MG/ML IJ SOLN
30.0000 ug/min | INTRAVENOUS | Status: DC
  Administered 2014-08-31: 60 ug/min via INTRAVENOUS
  Filled 2014-08-31 (×2): qty 1

## 2014-08-31 MED ORDER — MORPHINE SULFATE 2 MG/ML IJ SOLN
2.0000 mg | INTRAMUSCULAR | Status: DC | PRN
  Administered 2014-08-31 (×2): 2 mg via INTRAVENOUS
  Filled 2014-08-31 (×2): qty 1

## 2014-08-31 MED ORDER — VANCOMYCIN HCL 10 G IV SOLR
1500.0000 mg | INTRAVENOUS | Status: AC
  Administered 2014-08-31: 1500 mg via INTRAVENOUS
  Filled 2014-08-31 (×2): qty 1500

## 2014-08-31 MED ORDER — VASOPRESSIN 20 UNIT/ML IJ SOLN
0.0300 [IU]/min | INTRAVENOUS | Status: DC
  Administered 2014-08-31: 0.03 [IU]/min via INTRAVENOUS
  Filled 2014-08-31: qty 2

## 2014-08-31 MED ORDER — METHOCARBAMOL 750 MG PO TABS
750.0000 mg | ORAL_TABLET | Freq: Four times a day (QID) | ORAL | Status: DC | PRN
  Administered 2014-08-31 – 2014-09-02 (×5): 750 mg via ORAL
  Filled 2014-08-31 (×6): qty 1

## 2014-08-31 MED ORDER — DEXTROSE 5 % IV SOLN
2.0000 g | Freq: Three times a day (TID) | INTRAVENOUS | Status: DC
  Administered 2014-08-31 – 2014-09-01 (×4): 2 g via INTRAVENOUS
  Filled 2014-08-31 (×6): qty 2

## 2014-08-31 MED ORDER — SODIUM CHLORIDE 0.9 % IV BOLUS (SEPSIS)
1000.0000 mL | Freq: Once | INTRAVENOUS | Status: AC
  Administered 2014-08-31 (×2): 1000 mL via INTRAVENOUS

## 2014-08-31 MED FILL — Heparin Sodium (Porcine) Inj 1000 Unit/ML: INTRAMUSCULAR | Qty: 30 | Status: AC

## 2014-08-31 MED FILL — Sodium Chloride IV Soln 0.9%: INTRAVENOUS | Qty: 1000 | Status: AC

## 2014-08-31 NOTE — Plan of Care (Signed)
Problem: Consults Goal: Diagnosis - Spinal Surgery Thoraco/Lumbar Spine Fusion     

## 2014-08-31 NOTE — Progress Notes (Signed)
PT Cancellation Note  Patient Details Name: Ellen Henry MRN: [id number] DOB: September 03, 1946   Cancelled Treatment:    Reason Eval/Treat Not Completed: Medical issues which prohibited therapy Pt with BP progressively decreasing. Not appropriate for PT evaluation at this time. Will follow up for PT evaluation when medically ready.   [name], [place]  [name] 08/31/2014, 10:48 AM

## 2014-08-31 NOTE — Significant Event (Signed)
Rapid Response Event Note  Overview: Time Called: 6144 Arrival Time: 0850 Event Type: Hypotension  Initial Focused Assessment: Patient with Bp 70- 80/40-50 over night.  0330 Rectal temp 103,  0600 rectal temp 101.7 Per staff patient alert and oriented during night. Upon my arrival patient complaining of sever back pain.  Dr Tana Coast also at bedside to assess patient. BP 82/53  SR/ST 90-100  RR 14-16  O2 sat 95-98% on 2L [place]   Interventions: Completed NS fluid bolus ordered.  ( 2L NS total since last night) Left arm PIV started, 22ga.  Labs drawn and sent. Notified Dr Tana Coast that BP with bolus infusing 62/38 Dr Halford Chessman and Theadora Rama NP at bedside to assess patient.   3rd L NS bolus initiated via Right EJ NS @ 125 via left PIV Patient remains alert and orient, continues to complain of sever back pain. Sister at bedside updated on patient status Transferred to 2M09 via bed RN at beside to receive patient.  Event Summary: Name of Physician Notified: Dr Tana Coast at bedside at    Name of Consulting Physician Notified: Dr Halford Chessman at 1015  Outcome: Transferred (Comment) [phone])  Event End Time: 1128  Raliegh Ip

## 2014-08-31 NOTE — Progress Notes (Signed)
OT Cancellation Note  Patient Details Name: Ellen Henry MRN: [id number] DOB: [birth year]   Cancelled Treatment:    Reason Eval/Treat Not Completed: Patient not medically ready; Pt with declining blood pressure. OT to reattempt.  Hortencia Pilar 08/31/2014, 1:39 PM

## 2014-08-31 NOTE — Significant Event (Addendum)
Rapid Response Event Note Original phone consult beginning at 0323. Bedside RN called concerning soft manual BP. RN advised to recheck BP with second RN and notify provider for orders. Justine Null PA paged an orders received for 500NS fluid bolus. Upon call back RN concerned about pt HR elevation and overall discomfort, RN advised to check pt rectal temp, revealing 103. PA notified of temp, AM labs ordered as well as blood cultures. Pt seen bedside at 0430.  Overview: Time Called: 63 (Original phone consult began at 68) Arrival Time: 0430 Event Type: Hypotension  Initial Focused Assessment: Upon m y arrival pt found resting in bed complaining of severe pain in back and anterior surgical site. Lungs course but with good air movement. P02 100% on   [place], hr 110s, 1st 500 NS bolus infusing.Manual BP obtained yielding 72/48. Both anterior and posterior surgicla sites dressing CDI.   Interventions: Justine Null PA paged, updated on current VS and intractable pain, per PA to hold morphine PCA considering low bp and give Tramadol for now. 2nd 500 NS bolus ordered at 0515.  0600 -Pt IV NS bolus infusing, bp 85/42 , Rectal temp rechecked yielding 101.7. Rn to recheck BP once bolus finished. Advised to notify PA if bp 85/42 remains low after 2nd bolus. Pt remains asymptomatic.      [place], [place]

## 2014-08-31 NOTE — Progress Notes (Signed)
Phlebotomy/Lab called regarding STAT lab orders. RN was told that "lab has been chasing STAT orders all night and will get there shortly". Nursing will continue to monitor.

## 2014-08-31 NOTE — Progress Notes (Addendum)
ANTIBIOTIC CONSULT NOTE - INITIAL  Pharmacy Consult for vancomycin and cefepime Indication: rule out sepsis  Allergies  Allergen Reactions  . Food Allergy Formula Other (See Comments)    Shellfish-banana-beef-flares up her gout    Patient Measurements: Height: 4' 11.84" (152 cm) Weight: 236 lb (107.049 kg) IBW/kg (Calculated) : 45.14   Vital Signs: Temp: 99.4 F (37.4 C) (09/25 0731) Temp src: Oral (09/25 0731) BP: 81/53 mmHg (09/25 0731) Pulse Rate: 108 (09/25 0731) Intake/Output from previous day: 09/24 0701 - 09/25 0700 In: 2110 [P.O.:240; I.V.:1870] Out: 2778 [EUMPN:3614; Drains:100; Blood:300] Intake/Output from this shift:    Labs:  Recent Labs  08/29/14 1341  HGB 10.9*   Estimated Creatinine Clearance: 73.2 ml/min (by C-G formula based on Cr of 0.75). No results found for this basename: VANCOTROUGH, Corlis Leak, VANCORANDOM, GENTTROUGH, GENTPEAK, GENTRANDOM, TOBRATROUGH, TOBRAPEAK, TOBRARND, AMIKACINPEAK, AMIKACINTROU, AMIKACIN,  in the last 72 hours   Microbiology: Recent Results (from the past 720 hour(s))  SURGICAL PCR SCREEN     Status: None   Collection Time    08/21/14 12:03 PM      Result Value Ref Range Status   MRSA, PCR NEGATIVE  NEGATIVE Final   Staphylococcus aureus NEGATIVE  NEGATIVE Final   Comment:            The Xpert SA Assay (FDA     approved for NASAL specimens     in patients over 49 years of age),     is one component of     a comprehensive surveillance     program.  Test performance has     been validated by [employer] for patients greater     than or equal to 49 year old.     It is not intended     to diagnose infection nor to     guide or monitor treatment.    Medical History: Past Medical History  Diagnosis Date  . Hypertension   . Arthritis   . Gout    Assessment: Patient is a 68 y.o F s/p s/p lumbar fusion on 9/23 and posterior Instrumentation and decompression on 9/24.  She's now hypotensive and febrile.  To  start broad coverage with vancomycin and cefepime for suspected sepsis.  Scr  0.75 on 9/15 -- new labs pending  9/25 vanc>> 9/25 cefepime>>  9/25 bcx x2> pending 9/25 ucx>> pending  Goal of Therapy:  Vancomycin trough level 15-20 mcg/ml  Plan:  1) vancomycin 1500mg  IV x1 now 2) f/u with new BMP.  Will enter maintenance vancomycin regimen based on renal function 3) cefepime 2gm IV q8h  Pham, Anh P 08/31/2014,9:44 AM  Addendum: SCr is up at 1.44 - estimated CrCl ~ 40-68mL/min.  UOP recorded as 0.6 cc/kg/hr but unsure if this is accurate.  Plan: Continue vancomycin 1500mg  IV q24h - next dose 09/01/14.   Sloan Leiter, PharmD, BCPS Clinical Pharmacist [phone] 08/31/2014, 2:26 PM

## 2014-08-31 NOTE — Consult Note (Signed)
Medical Consultation   Ellen Henry  JJK:[id number]  DOB: 04/01/1946  DOA: 08/29/2014  PCP: Elyn Peers, MD  Requesting physician: Dr Lynann Bologna  Reason for consultation: hypotension   History of Present Illness: Patient is a 68 year old female with history of hypertension, chronic back pain with L4-L5 spinal stenosis and radiculopathy underwent lumbar fusion surgery on 08/29/2014.  The patient was noticed to be getting hypotensive overnight with systolic BP in 37J (69/67 at 3 AM), patient was given multiple normal saline boluses. Patient was also noticed to be febrile with temper 103, UA and blood cultures were ordered. UA positive for UTI, one set of blood cultures still in process. Medicine was consulted as patient was continuing to be hypotensive despite fluid boluses.    Allergies:   Allergies  Allergen Reactions  . Food Allergy Formula Other (See Comments)    Shellfish-banana-beef-flares up her gout      Past Medical History  Diagnosis Date  . Hypertension   . Arthritis   . Gout     Past Surgical History  Procedure Laterality Date  . 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 Appendectomy    . Tonsillectomy    . Joint replacement    . Abdominal hysterectomy    . Carpal tunnel release Right     release done twice  . Anterior lumbar fusion N/A 08/29/2014    Procedure: ANTERIOR LUMBAR FUSION 1 LEVEL;  Surgeon: Sinclair Ship, MD;  Location: [facility];  Service: Orthopedics;  Laterality: N/A;  Lumbar 4-5 anterior lumbar interbody fusion with allograft and instrumentation.  . Abdominal exposure N/A 08/29/2014    Procedure: ABDOMINAL EXPOSURE;  Surgeon: Rosetta Posner, MD;  Location: [facility] OR;  Service: Vascular;  Laterality: N/A;    Social History:  reports that she has been smoking Cigarettes.  She has a 1.25 pack-year smoking history. She has never used smokeless tobacco. She reports that she does not drink alcohol or use illicit drugs.  Family History   Problem Relation Age of Onset  . Varicose Veins Mother     Review of Systems:   Constitutional: + fever, chills, diaphoresis, appetite change and fatigue.  HEENT: Denies photophobia, eye pain, redness, hearing loss, ear pain, congestion, sore throat, rhinorrhea, sneezing, mouth sores, trouble swallowing, neck pain, neck stiffness and tinnitus.   Respiratory: Denies SOB, DOE, cough, chest tightness,  and wheezing.   Cardiovascular: Denies chest pain, palpitations and leg swelling.  Gastrointestinal: +nausea, no vomiting, abdominal pain, diarrhea, constipation, blood in stool and abdominal distention.  Genitourinary: Denies dysuria, urgency, frequency, hematuria, flank pain and difficulty urinating.  Musculoskeletal: Denies myalgias, back pain, joint swelling, arthralgias and gait problem. + back pain Skin: Denies pallor, rash and wound.  Neurological: +dizziness with generalized weakness and lightheadedness. No syncope or headaches of any seizures, Hematological: Denies adenopathy. Easy bruising, personal or family bleeding history  Psychiatric/Behavioral: Denies suicidal ideation, mood changes, confusion, nervousness, sleep disturbance and agitation   Physical Exam: Blood pressure 81/53, pulse 108, temperature 99.4 F (37.4 C), temperature source Oral, resp. rate 15, height 4' 11.84" (1.52 m), weight 107.049 kg (236 lb), SpO2 95.00%.  General: Alert and awake, oriented x3 uncomfortable due to pain  HEENT: normocephalic, atraumatic, anicteric sclera, pupils reactive to light and accommodation, EOMI, oropharynx clear CVS:  tachycardia, S1-S2 clear, no murmur rubs or gallops Chest: clear to auscultation bilaterally, no wheezing, rales or rhonchi Abdomen: soft nontender, nondistended, + bowel sounds Extremities: no cyanosis, clubbing or edema  noted bilaterally Neuro: Cranial nerves II-XII intact, no focal neurological deficits Psych: alert and oriented,  anxious  Skin: no rashes or  lesions  Labs on Admission:  Basic Metabolic Panel:  Recent Labs Lab 08/29/14 1341  NA 141  K 3.0*   Liver Function Tests: No results found for this basename: AST, ALT, ALKPHOS, BILITOT, PROT, ALBUMIN,  in the last 168 hours No results found for this basename: LIPASE, AMYLASE,  in the last 168 hours No results found for this basename: AMMONIA,  in the last 168 hours CBC:  Recent Labs Lab 08/29/14 1341  HGB 10.9*  HCT 32.0*   Cardiac Enzymes: No results found for this basename: CKTOTAL, CKMB, CKMBINDEX, TROPONINI,  in the last 168 hours BNP: No components found with this basename: POCBNP,  CBG: No results found for this basename: GLUCAP,  in the last 168 hours  Inpatient Medications:   Scheduled Meds: . acetaminophen  650 mg Oral TID  . docusate sodium  100 mg Oral BID  . gabapentin  600 mg Oral TID  . [START ON 09/01/2014] Influenza vac split quadrivalent PF  0.5 mL Intramuscular Tomorrow-1000  . [START ON 09/01/2014] pneumococcal 23 valent vaccine  0.5 mL Intramuscular Tomorrow-1000  . sodium chloride  500 mL Intravenous Once   Continuous Infusions: . sodium chloride 75 mL/hr at 08/30/14 2122     Radiological Exams on Admission: Dg Lumbar Spine 2-3 Views  08/30/2014   CLINICAL DATA:  L4-L5 posterior fusion.  Operative imaging.  EXAM: DG C-ARM 61-120 MIN; LUMBAR SPINE - 2-3 VIEW  COMPARISON:  Lumbar MRI, 02/03/2014.  FINDINGS: Submitted images show an anterior fusion plate and screws extending from L4-L5 and posterior left pedicle screws and interconnecting rod also extending from L4-L5. Radiolucent disc spacer maintains disc height, well-centered. No acute fracture or evidence of an operative complication.  IMPRESSION: Operative images from a L4-L5 fusion as described.   Electronically Signed   By: Lajean Manes M.D.   On: 08/30/2014 10:24   Dg Lumbar Spine 2-3 Views  08/29/2014   CLINICAL DATA:  L4-5 interbody fusion.  EXAM: LUMBAR SPINE - 2-3 VIEW; DG C-ARM 61-120  MIN  COMPARISON:  MRI scan of February 03, 2014.  FINDINGS: Two intraoperative fluoroscopic images were obtained of the lower lumbar spine. These images demonstrate surgical anterior and interbody fusion of L4-5 with good alignment of vertebral bodies.  IMPRESSION: Surgical anterior and interbody fusion of L4-5.   Electronically Signed   By: Sabino Dick M.D.   On: 08/29/2014 14:11   Dg C-arm 1-60 Min  08/30/2014   CLINICAL DATA:  L4-L5 posterior fusion.  Operative imaging.  EXAM: DG C-ARM 61-120 MIN; LUMBAR SPINE - 2-3 VIEW  COMPARISON:  Lumbar MRI, 02/03/2014.  FINDINGS: Submitted images show an anterior fusion plate and screws extending from L4-L5 and posterior left pedicle screws and interconnecting rod also extending from L4-L5. Radiolucent disc spacer maintains disc height, well-centered. No acute fracture or evidence of an operative complication.  IMPRESSION: Operative images from a L4-L5 fusion as described.   Electronically Signed   By: Lajean Manes M.D.   On: 08/30/2014 10:24   Dg C-arm 1-60 Min  08/29/2014   CLINICAL DATA:  L4-5 interbody fusion.  EXAM: LUMBAR SPINE - 2-3 VIEW; DG C-ARM 61-120 MIN  COMPARISON:  MRI scan of February 03, 2014.  FINDINGS: Two intraoperative fluoroscopic images were obtained of the lower lumbar spine. These images demonstrate surgical anterior and interbody fusion of L4-5 with good alignment  of vertebral bodies.  IMPRESSION: Surgical anterior and interbody fusion of L4-5.   Electronically Signed   By: Sabino Dick M.D.   On: 08/29/2014 14:11   Dg Or Local Abdomen  08/29/2014   CLINICAL DATA:  Interoperative foreign body count  EXAM: OR LOCAL ABDOMEN  COMPARISON:  11/28/2009  FINDINGS: Bowel gas pattern is nonobstructive. Fusion hardware is present at the L4-5 level compatible patient's ALIF. There are a few surgical clips at this level. Otherwise, no evidence of foreign body.  IMPRESSION: Postsurgical change compatible with ALIF.  No foreign body.  These results  were called by telephone at the time of interpretation on 08/29/2014 at 12:58 pm to OR room 5.   Electronically Signed   By: Marin Olp M.D.   On: 08/29/2014 12:59    Impression/Recommendations Principal Problem:   Radiculopathy -Status post lumbar fusion surgery, management per orthopedics   Active Problems:   SIRS (systemic inflammatory response syndrome) With hypotension, UTI - Patient has a history of hypertension, was on ACEI, amlodipine and HCTZ, I have discontinued all the antihypertensives - Discussed with orthopedics, PCA stopped, placed on scheduled Tylenol, PRN ibuprofen, morphine as needed - Stat CBC, BMET, lactic acid, procalcitonin still pending, ordered cortisol - Stat portable chest x-ray showed left basilar atelectasis - UA positive for UTI, for now placed on vancomycin and cefepime broad-spectrum antibiotics until cultures are received back - Patient's BP continues to plummet, received call from RN BP 60/37,  I have consulted PCCM to evaluate, d/w Dr Halford Chessman and will transfer to ICU, may need vasopressors     Hypotension, unspecified - Patient has history of hypertension, hold all antihypertensives, management as #2    UTI (urinary tract infection) -Continue cefepime, follow urine culture and sensitivity    Time Spent on Admission: 1 hour   RAI,RIPUDEEP M.D. [facility] 08/31/2014, 9:01 AM

## 2014-08-31 NOTE — Progress Notes (Signed)
    2 Days PO Anterior lumbar fusion, 1 Day PO Posterior Instrumentation   Pt c/o abdominal px about surgical site as mild-moderate, LBP as mod-severe. Her leg pain is resolved. Her PCA was D/C's overnight due to low BP and pt has only had tramadol for px control. She has been sipping and eating a small amount, foley in place with low output. Pt has hx of ARF after prior knee arthroscopy. Pt received 500 fluid bolus overnight with mild improvement in BP.   BP 81/53  Pulse 108  Temp(Src) 99.4 F (37.4 C) (Oral)  Resp 15  Ht 4' 11.84" (1.52 m)  Wt 107.049 kg (236 lb)  BMI 46.33 kg/m2  SpO2 95%  Dressing in place ant and post, CDI, drain in place, output 50cc, pulled w/o difficulty. Pt laying in hospital bed appearing uncomfortable. Diffuse abdominal pain about incision site. SCD's in place, NVI.   POD #2 s/p Anterior Lumbar Fusion, PO Day #1 S/P Posterior Instrumentation and decompression   -Leg px resolved -Expected PO LBP and abdominal px about sx site  -Px not well controlled    -Will D/C PCA and Tramadol   -Start Percocet and monitor BP    -If tolerating percocet OK with regards to BP will consider Oxycontin q12   -Hold Valium and trial Robaxin instead given BP issues -Will consult medicine team regarding pt given hypotension and hx of ARF with last admission   -Give additional 500cc fluid bolus  -CBC and CMP ordered  - up with PT/OT, encourage ambulation - likely d/c home sat vs Sunday pending px control, PT progress and med team

## 2014-08-31 NOTE — Progress Notes (Signed)
[facility] Progress Note Patient Name: Ellen Henry DOB: July 17, 1946 MRN: [id number]   Date of Service  08/31/2014  HPI/Events of Note  Pain not controlled with current meds  eICU Interventions  Dc morphine Add dilauded     Intervention Category Minor Interventions: Routine modifications to care plan (e.g. PRN medications for pain, fever)  Raylene Miyamoto. 08/31/2014, 6:41 PM

## 2014-08-31 NOTE — Procedures (Signed)
Central Venous Catheter Insertion Procedure Note Ellen Henry [id number] 1946-07-09  Procedure: Insertion of Central Venous Catheter Indications: Assessment of intravascular volume, Drug and/or fluid administration and Frequent blood sampling  Procedure Details Consent: Risks of procedure as well as the alternatives and risks of each were explained to the (patient/caregiver).  Consent for procedure obtained. Time Out: Verified patient identification, verified procedure, site/side was marked, verified correct patient position, special equipment/implants available, medications/allergies/relevent history reviewed, required imaging and test results available.  Performed Real time [place] was used to ID and cannulate the vessel  Maximum sterile technique was used including antiseptics, cap, gloves, gown, hand hygiene, mask and sheet. Skin prep: Chlorhexidine; local anesthetic administered A antimicrobial bonded/coated triple lumen catheter was placed in the left internal jugular vein using the Seldinger technique.  Evaluation Blood flow good Complications: No apparent complications Patient did tolerate procedure well. Chest X-ray ordered to verify placement.  CXR: pending.  BABCOCK,PETE 08/31/2014, 11:40 AM  I was present for procedure.  Chesley Mires, MD [facility] Pulmonary/Critical Care 08/31/2014, 12:12 PM Pager:  [phone] After 3pm call: [phone]

## 2014-08-31 NOTE — Progress Notes (Signed)
500 cc Bolus of NS administered. Pts BP now 72/42. RR nurse notified, PA on call paged.  Received orders for second 500 cc bolus, and to continue to hold low dose morphine PCA. Given orders to also give patient 100 mg tramadol for pain management for now. Pain medicine administered & bolus initiated. Nursing will continue to monitor.

## 2014-08-31 NOTE — Op Note (Signed)
Ellen Henry, Ellen Henry              ACCOUNT NO.:  [id number]  MEDICAL RECORD NO.:  [id number]  LOCATION:  [id number]                        FACILITY:  [facility]  PHYSICIAN:  Phylliss Bob, MD      DATE OF BIRTH:  06/16/1946  DATE OF PROCEDURE:  08/30/2014                              OPERATIVE REPORT   PREOPERATIVE DIAGNOSES: 1. L4-5 spinal stenosis. 2. Lumbar radiculopathy. 3. Status post anterior lumbar fusion procedure.  POSTOPERATIVE DIAGNOSES: 1. L4-5 spinal stenosis. 2. Lumbar radiculopathy. 3. Status post anterior lumbar fusion procedure.  PROCEDURE (STAGE 2 OF 2): 1. L4-5 posterior spinal fusion. 2. Insertion of posterior instrumentation, L4-5 (7 x 40 mm screws). 3. L4-5 decompression. 4. Use of local autograft. 5. Intraoperative use of fluoroscopy.  SURGEON:  Phylliss Bob, MD  ASSISTANT:  Pricilla Holm, PA-C  ANESTHESIA:  General endotracheal anesthesia.  COMPLICATIONS:  None.  DISPOSITION:  Stable.  ESTIMATED BLOOD LOSS:  100 mL.  INDICATIONS FOR SURGERY:  Briefly, [name] is a pleasant 68 year old female who did present to me with a history of bilateral leg pain. I would refer to the patient's operative report on August 29, 2014, for additional details regarding the patient's history.  The patient did ultimately have an anterior lumbar interbody fusion by me on August 29, 2014.  This was stage I of what was to be a II stage procedure.  The plan for today's procedure, was to go forward with a posterior spinal instrumentation and fusion.  If the patient did continue to have radicular symptoms, the plan was also to proceed with a decompressive procedure.  The patient did in fact continue to report pain in her right leg the morning of her stage II procedure.  We, therefore, did discuss proceeding with the plan noted above, and the patient did elect to proceed.  OPERATIVE DETAILS:  On August 30, 2014, the patient was brought to surgery and general  endotracheal anesthesia was administered.  The patient was placed prone on a well-padded flat Jackson bed with a spinal frame.  Antibiotics were given and the back was prepped and draped in the usual sterile fashion and a time-out procedure was performed.  I then brought in AP fluoroscopy and marked out the L4 and L5 pedicles. In a minimally invasive fashion, Jamshidi were advanced across the L4 and L5 pedicles on the left side, liberally using AP and lateral fluoroscopy.  I then tapped over the guidewires and I ultimately placed 6 x 40 mm screws over the guidewires.  A 45 mm rod was advanced over the screws and the caps were placed and a final locking procedure was performed.  Of note, I did use triggered EMG to test the screws after placement, and there was no screw that tested below 20 milliamps.  The wound was then copiously irrigated and closed in layers, using #1 Vicryl followed by 2-0 Vicryl, followed by 3-0 Monocryl.  I then made a midline incision overlying the L4-5 interspace.  The fascia was incised at the midline and the paraspinal musculature was bluntly swept laterally.  I then exposed the right L4-5 posterolateral gutter.  I did use a high- speed bur to decorticate the L4-5 facet joint  and the transverse processes of L4 and L5.  I then turned my attention towards the decompressive aspect of the procedure.  The L4 spinous process was removed.  I then proceeded with the decompression using a series of Kerrison punches.  I did use #1, followed by #2 Kerrison, followed by #3 Kerrison to perform a bilateral partial facetectomy.  I then proceeded with a liberal lateral recess decompression, with particular attention on the right side, given the patient's right leg symptomatology.  I was ultimately able to confirm a thorough and complete lateral recess decompression.  At the termination of the decompression, I was able to pass a [facility] out the right L4-5 neural foramen,  and medial to the L5 pedicle.  I was able to fully visualize the traversing L5 nerve, and the nerve was noted to be free of any compression.  The wound was then copiously irrigated.  There were some minor epidural bleeding which was controlled using bipolar electrocautery, in addition to Surgiflo.  I then placed a #15 deep Blake drain, deep to the fascia.  Bone graft obtained from the decompression was packed along the posterolateral gutter on the right and along the right L4-5 facet joint, to help aid in the success of the fusion.  Prior to this, the wound was copiously irrigated with approximately 2 L of normal saline.  The fascia was then closed using #1 Vicryl.  The subcutaneous layer was closed using 2-0 Vicryl and the skin was closed using 3-0 Monocryl.  Benzoin and Steri- Strips were applied followed by a sterile dressing.  All instrument counts were correct at the termination of the procedure.  Of note, Pricilla Holm was my assistant throughout the surgery, and did aid in retraction, suctioning and closure.     Phylliss Bob, MD     MD/MEDQ  D:  08/30/2014  T:  08/31/2014  Job:  [id number]

## 2014-08-31 NOTE — Progress Notes (Signed)
Pt's rectal Temp rechecked- 101.7. Pt's BP also rechecked after 2nd bolus completion- 81/54. All information regarding 1900-0700 on 9/24-9/25 were all reported off to daytime nurse Ramond Dial. Nursing will continue to monitor.

## 2014-08-31 NOTE — Progress Notes (Signed)
Informed Dr Halford Chessman of coox results.

## 2014-08-31 NOTE — Progress Notes (Signed)
Rectal Temp. 103.0. PA on call notified. Received STAT orders from Joanell Rising PA for blood cultures, CBC, BMP, & UA w/ urine cultures. Orders placed, nursing will continue to monitor.

## 2014-08-31 NOTE — Progress Notes (Signed)
Rapid response and Dr. Tana Coast at bedside with patient.

## 2014-08-31 NOTE — Progress Notes (Addendum)
Pt's BP @0230  is 85/51 per dynamap. RN rechecked manually and found BP to be 71/48. Rapid response nurse notified. PA on call notified, received orders from Joanell Rising PA to give pt 500 cc NS bolus. Orders initiated, nursing will continue to monitor.

## 2014-08-31 NOTE — Progress Notes (Signed)
CRITICAL VALUE ALERT  Critical value received:  Troponin 2.69  Date of notification:  08/31/2014  Time of notification:  3338  Critical value read back:Yes.    Nurse who received alert:  Marisue Brooklyn  MD notified (1st page):  Dr Halford Chessman  Time of first page:  1405  MD notified (2nd page):  Time of second page:  Responding MD:  Dr Halford Chessman  Time MD responded:  [phone]

## 2014-08-31 NOTE — Consult Note (Signed)
PULMONARY / CRITICAL CARE MEDICINE   Name: Ellen Henry MRN: [id number] DOB: [birth year]    ADMISSION DATE:  08/29/2014 CONSULTATION DATE:  08/31/14  REFERRING MD :  Dr. Lynann Bologna   CHIEF COMPLAINT:  UTI, Hypotension   INITIAL PRESENTATION: 68 y/o AAF with a PMH of bilateral leg pain admitted 9/23 for planned two stage anterior L4-5 fusion. The patient returned to the OR on 9/24 for the posterior portion of the fusion.  Post-operatively 9/25, the patient developed hypotension with concerns for UTI.      STUDIES:  9/25  CXR >> left basilar linear densities, c/w atelectasis   SIGNIFICANT EVENTS: 9/23  Admit for two stage lumbar fusion secondary to L4-5 spinal stenosis, lumbar radiculopathy 9/24  Returned to OR for second phase of fusion (posterior) 9/25  Developed hypotension, urine worrisome for UTI, PCCM consulted.  Tx'd to ICU.    HISTORY OF PRESENT ILLNESS: 68 y/o AAF with a PMH of HTN, Gout, Arthritis and bilateral leg pain in the setting of L4-5 stenosis (MRI) and spondylolisthesis that failed outpatient conservative therapy.  She was admitted 9/23 for a planned two stage lumbar fusion, anterior approach was completed.  She returned to the OR on 9/24 for the second phase of the fusion (posterior).  On 9/25, she developed hypotension and concern for UTI (UA positive for 11-20 WBC, neg nitrate, moderate leukocytes, RBC TNTC, & few bacteria.  She was empirically started on vancomycin + Cefepime for sepsis and transferred to ICU.  PCCM consulted for hypotension, concerns for urosepsis.      PAST MEDICAL HISTORY :  Past Medical History  Diagnosis Date  . Hypertension   . Arthritis   . Gout    Past Surgical History  Procedure Laterality Date  . 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 Appendectomy    . Tonsillectomy    . Joint replacement    . Abdominal hysterectomy    . Carpal tunnel release Right     release done twice  . Anterior lumbar fusion N/A 08/29/2014     Procedure: ANTERIOR LUMBAR FUSION 1 LEVEL;  Surgeon: Sinclair Ship, MD;  Location: [facility];  Service: Orthopedics;  Laterality: N/A;  Lumbar 4-5 anterior lumbar interbody fusion with allograft and instrumentation.  . Abdominal exposure N/A 08/29/2014    Procedure: ABDOMINAL EXPOSURE;  Surgeon: Rosetta Posner, MD;  Location: [facility] OR;  Service: Vascular;  Laterality: N/A;   Prior to Admission medications   Medication Sig Start Date End Date Taking? Authorizing Provider  gabapentin (NEURONTIN) 600 MG tablet Take 600 mg by mouth 3 (three) times daily.   Yes Historical Provider, MD  Olmesartan-Amlodipine-HCTZ (TRIBENZOR) 20-5-12.5 MG TABS Take 1 tablet by mouth daily.   Yes Historical Provider, MD  colchicine 0.6 MG tablet Take 0.6 mg by mouth daily as needed. For gout    Historical Provider, MD   Allergies  Allergen Reactions  . Food Allergy Formula Other (See Comments)    Shellfish-banana-beef-flares up her gout    FAMILY HISTORY:  Family History  Problem Relation Age of Onset  . Varicose Veins Mother    SOCIAL HISTORY:  reports that she has been smoking Cigarettes.  She has a 1.25 pack-year smoking history. She has never used smokeless tobacco. She reports that she does not drink alcohol or use illicit drugs.  REVIEW OF SYSTEMS:    Gen: Denies fever, chills, weight change, fatigue, night sweats HEENT: Denies blurred vision, double vision, hearing loss,  tinnitus, sinus congestion, rhinorrhea, sore throat, neck stiffness, dysphagia PULM: Denies shortness of breath, cough, sputum production, hemoptysis, wheezing CV: Denies chest pain, edema, orthopnea, paroxysmal nocturnal dyspnea, palpitations GI: Denies nausea, vomiting, diarrhea, hematochezia, melena, constipation, change in bowel habits.  + LLQ abd tenderness  GU: Denies dysuria, hematuria, polyuria, oliguria, urethral discharge Endocrine: Denies hot or cold intolerance, polyuria, polyphagia or appetite change Derm: Denies rash, dry  skin, scaling or peeling skin change Heme: Denies easy bruising, bleeding, bleeding gums Neuro: Denies headache, numbness, weakness, slurred speech, loss of memory or consciousness MSK:  + for low back pain, reports improvement in LE weakness  SUBJECTIVE:   VITAL SIGNS: Temp:  [96.8 F (36 C)-103 F (39.4 C)] 99.4 F (37.4 C) (09/25 0731) Pulse Rate:  [77-121] 108 (09/25 0731) Resp:  [7-21] 15 (09/25 0731) BP: (70-124)/(45-77) 81/53 mmHg (09/25 0731) SpO2:  [90 %-100 %] 95 % (09/25 0731)  HEMODYNAMICS:    INTAKE / OUTPUT:  Intake/Output Summary (Last 24 hours) at 08/31/14 1034 Last data filed at 08/31/14 8546  Gross per 24 hour  Intake   2110 ml  Output    940 ml  Net   1170 ml    PHYSICAL EXAMINATION: General:  Morbidly obese female in NAD  Neuro:  AAOx4, speech clear, MAE  HEENT:  Mm pink/moist, no jvd  Cardiovascular:  s1s2 rrr, no m/r/g  Lungs:  resp's even/non-labored, lungs bilaterally distant but clear  Abdomen:  Obese, soft, bsx4 active.  Abd incision c/d/i Musculoskeletal:  No acute deformities  Skin:  Warm/dry, no edema.  Posterior lower back incision with minimal bloody drainage on bandage, site without erythema or warmth.    LABS:  CBC  Recent Labs Lab 08/29/14 1341 08/31/14 0945  WBC  --  11.3*  HGB 10.9* 11.8*  HCT 32.0* 36.0  PLT  --  125*   Coag's No results found for this basename: APTT, INR,  in the last 168 hours  BMET  Recent Labs Lab 08/29/14 1341  NA 141  K 3.0*   Electrolytes No results found for this basename: CALCIUM, MG, PHOS,  in the last 168 hours  Sepsis Markers No results found for this basename: LATICACIDVEN, PROCALCITON, O2SATVEN,  in the last 168 hours  ABG  Recent Labs Lab 08/29/14 1341  PHART 7.360  PCO2ART 42.4  PO2ART 90.0   Liver Enzymes No results found for this basename: AST, ALT, ALKPHOS, BILITOT, ALBUMIN,  in the last 168 hours  Cardiac Enzymes No results found for this basename: TROPONINI,  PROBNP,  in the last 168 hours  Glucose No results found for this basename: GLUCAP,  in the last 168 hours  Imaging Dg Lumbar Spine 2-3 Views  08/30/2014   CLINICAL DATA:  L4-L5 posterior fusion.  Operative imaging.  EXAM: DG C-ARM 61-120 MIN; LUMBAR SPINE - 2-3 VIEW  COMPARISON:  Lumbar MRI, 02/03/2014.  FINDINGS: Submitted images show an anterior fusion plate and screws extending from L4-L5 and posterior left pedicle screws and interconnecting rod also extending from L4-L5. Radiolucent disc spacer maintains disc height, well-centered. No acute fracture or evidence of an operative complication.  IMPRESSION: Operative images from a L4-L5 fusion as described.   Electronically Signed   By: Lajean Manes M.D.   On: 08/30/2014 10:24   Dg C-arm 1-60 Min  08/30/2014   CLINICAL DATA:  L4-L5 posterior fusion.  Operative imaging.  EXAM: DG C-ARM 61-120 MIN; LUMBAR SPINE - 2-3 VIEW  COMPARISON:  Lumbar MRI, 02/03/2014.  FINDINGS:  Submitted images show an anterior fusion plate and screws extending from L4-L5 and posterior left pedicle screws and interconnecting rod also extending from L4-L5. Radiolucent disc spacer maintains disc height, well-centered. No acute fracture or evidence of an operative complication.  IMPRESSION: Operative images from a L4-L5 fusion as described.   Electronically Signed   By: Lajean Manes M.D.   On: 08/30/2014 10:24     ASSESSMENT / PLAN:  INFECTIOUS A:   Urosepsis - concern for UTI as contribution to hypotension.  DDx includes hypovolemia + narcs.   Fever  P:   BCx2 9/25 >> UA 9/25 >> 11-20 WBC, neg nitrate, moderate leukocytes, RBC TNTC, & few bacteria. UC 9/25 >>  Day 1 vancomycin, cefepime started 9/25 >> Likely can narrow abx in am 9/26 PRN tylenol   CARDIOVASCULAR Lt IJ TLC 9/25 >>   A:  Shock likely from UTI with sepsis vs volume depletion and pain meds (likely combination of both)  P:  NS bolus x2, then 125 ml/hr  Hold Tribenzor CVP for volume assessment   Assess lactic acid, PCT Assess EKG, troponin Pressors as needed to keep MAP > 65  ORTHO  A: S/P Anterior / Posterior L 4/5 Lumbar Fusion  P: Rec's per Ortho PT as able  NEUROLOGIC A:   Post op pain control  P:   PRN robaxin, oxycodone, morphine for pain  PULMONARY A: Atelectasis P:   F/u CXR as needed Bronchial hygiene  RENAL A:   AKI - in setting of volume depletion, UTI and sepsis P:   Volume resuscitation as above Ensure adequate renal perfusion  Hold nephrotoxic agents  Trend BMP, sr cr  GASTROINTESTINAL A:   Morbid Obesity P:   Clear liquid diet as tolerated  Bowel regimen: dulcolax, colace, senokot -s PRN zofran for nausea  HEMATOLOGIC A:   Anemia  Thrombocytopenia - preop 282, 125 on 9/25 P:  Monitor CBC closely for H/H & platelet trend   ENDOCRINE A:   Hx of Gout   P:   Monitor, supportive care   Family/patient updated last on 9/25  Interdisciplinary Family Meeting v Palliative Care Meeting on  n/a By day 7  TODAY'S SUMMARY: 68 y/o AAF admitted for two step L 4/5 fusion on 9/23.  Developed hypotension 9/25 with concerns for UTI.  Patient transferred to ICU for stabilization, TLC placed, volume resuscitation in progress.  Lactic acid reassuring.      Noe Gens, NP-C  Pulmonary & Critical Care Pgr: [phone] or [phone]  08/31/2014, 10:34 AM  Reviewed above, examined.  68 yo female s/p lumbar spine fusion now with fever, abnormal u/a, and shock >> likely from sepsis, hypovolemia, and pain meds.  Transferred to ICU, CVL placed.  Agree with current Abx pending cx results.  Monitor CVP and continue IV fluids >> add pressors if BP remains low.  Updated pt's family at bedside.  CC time 50 minutes.  Chesley Mires, MD [facility] Pulmonary/Critical Care 08/31/2014, 12:19 PM Pager:  [phone] After 3pm call: [phone]

## 2014-09-01 DIAGNOSIS — A419 Sepsis, unspecified organism: Secondary | ICD-10-CM | POA: Diagnosis not present

## 2014-09-01 DIAGNOSIS — R6521 Severe sepsis with septic shock: Secondary | ICD-10-CM

## 2014-09-01 DIAGNOSIS — R6520 Severe sepsis without septic shock: Secondary | ICD-10-CM

## 2014-09-01 LAB — BASIC METABOLIC PANEL
Anion gap: 10 (ref 5–15)
BUN: 15 mg/dL (ref 6–23)
CO2: 23 mEq/L (ref 19–32)
Calcium: 8.3 mg/dL — ABNORMAL LOW (ref 8.4–10.5)
Chloride: 107 mEq/L (ref 96–112)
Creatinine, Ser: 0.76 mg/dL (ref 0.50–1.10)
GFR calc Af Amer: >90 mL/min (ref >90)
GFR calc non Af Amer: 84 mL/min — ABNORMAL LOW (ref >90)
Glucose, Bld: 96 mg/dL (ref 70–99)
Potassium: 3.9 mEq/L (ref 3.7–5.3)
Sodium: 140 mEq/L (ref 137–147)

## 2014-09-01 LAB — URINE CULTURE
Colony Count: NO GROWTH
Culture: NO GROWTH

## 2014-09-01 LAB — TROPONIN I: Troponin I: 3.16 ng/mL (ref <0.30)

## 2014-09-01 LAB — CORTISOL: Cortisol, Plasma: 25.9 ug/dL

## 2014-09-01 LAB — PROCALCITONIN: Procalcitonin: 3.82 ng/mL

## 2014-09-01 MED ORDER — CETYLPYRIDINIUM CHLORIDE 0.05 % MT LIQD
7.0000 mL | Freq: Two times a day (BID) | OROMUCOSAL | Status: DC
  Administered 2014-09-01 – 2014-09-04 (×7): 7 mL via OROMUCOSAL

## 2014-09-01 MED ORDER — HYDROMORPHONE HCL 1 MG/ML IJ SOLN
1.0000 mg | Freq: Once | INTRAMUSCULAR | Status: AC
  Administered 2014-09-01: 1 mg via INTRAVENOUS

## 2014-09-01 MED ORDER — DEXTROSE 5 % IV SOLN
2.0000 g | Freq: Three times a day (TID) | INTRAVENOUS | Status: DC
  Administered 2014-09-01 – 2014-09-02 (×3): 2 g via INTRAVENOUS
  Filled 2014-09-01 (×4): qty 2

## 2014-09-01 MED ORDER — HYDROMORPHONE HCL 1 MG/ML IJ SOLN
1.0000 mg | INTRAMUSCULAR | Status: DC | PRN
  Administered 2014-09-01 – 2014-09-04 (×15): 1 mg via INTRAVENOUS
  Filled 2014-09-01 (×16): qty 1

## 2014-09-01 MED ORDER — DEXTROSE 5 % IV SOLN
2.0000 g | Freq: Two times a day (BID) | INTRAVENOUS | Status: DC
  Filled 2014-09-01: qty 2

## 2014-09-01 MED ORDER — CEFEPIME HCL 2 G IJ SOLR: 2.0000 g | INTRAMUSCULAR | Status: DC

## 2014-09-01 NOTE — Evaluation (Signed)
Physical Therapy Evaluation Patient Details Name: Ellen Henry MRN: [id number] DOB: 02/09/1946 Today's Date: 09/01/2014   History of Present Illness  Pt is a 68 y/o F with a PMH of bilateral leg pain admitted 9/23 for planned two stage anterior L4-5 fusion. The patient returned to the OR on 9/24 for the posterior portion of the fusion. Post-operatively 9/25, the patient developed hypotension with concerns for UTI.  Clinical Impression  Pt admitted with the above. Pt currently with functional limitations due to the deficits listed below (see PT Problem List). At the time of PT eval pt was overall limited by pain. Was able to transition to EOB and ambulate ~5 feet with RW and +2 assist. Pt will benefit from skilled PT to increase their independence and safety with mobility to allow discharge to the venue listed below. Feel that CIR would be the optimal d/c disposition for pt, however I discussed details of the pt case with a Rehab Admissions Coordinator, and pt would not be covered by current insurance under this diagnosis.  Because of this, SNF is the recommended d/c disposition.     Follow Up Recommendations SNF;Supervision/Assistance - 24 hour    Equipment Recommendations  Rolling walker with 5" wheels;3in1 (PT)    Recommendations for Other Services       Precautions / Restrictions Precautions Precautions: Fall;Back Precaution Comments: Discussed back precautions - pt needs precautions sheet Required Braces or Orthoses: Spinal Brace Spinal Brace: Other (comment);Applied in sitting position (thoracolumbar brace with leg component on R) Spinal Brace Comments: No order for brace in chart. Per RN brace to be donned in sitting.       Mobility  Bed Mobility Overal bed mobility: Needs Assistance;+2 for physical assistance Bed Mobility: Rolling;Sidelying to Sit Rolling: Mod assist Sidelying to sit: Mod assist       General bed mobility comments: VC's for sequencing and technique to  maintain back precautions.   Transfers Overall transfer level: Needs assistance Equipment used: Rolling walker (2 wheeled) Transfers: Sit to/from Stand Sit to Stand: Min assist;+2 physical assistance;+2 safety/equipment         General transfer comment: VC's for hand placement on seated surface for safety.   Ambulation/Gait Ambulation/Gait assistance: Min assist Ambulation Distance (Feet): 5 Feet Assistive device: Rolling walker (2 wheeled) Gait Pattern/deviations: Step-through pattern;Decreased stride length;Narrow base of support Gait velocity: Decreased Gait velocity interpretation: Below normal speed for age/gender General Gait Details: Pt moving very slowly due to pain. Pt willing to walk as much as she could tolerate. Assist for movement of RW occasionally.   Stairs            Wheelchair Mobility    Modified Rankin (Stroke Patients Only)       Balance Overall balance assessment: Needs assistance Sitting-balance support: Feet supported;No upper extremity supported Sitting balance-Leahy Scale: Fair     Standing balance support: Bilateral upper extremity supported;During functional activity Standing balance-Leahy Scale: Poor Standing balance comment: Pt requires bilateral UE assist for balance at this time.                              Pertinent Vitals/Pain Pain Assessment: 0-10 Pain Score: 10-Worst pain ever Pain Location: Lumbar spine Pain Intervention(s): Premedicated before session;Monitored during session;Limited activity within patient's tolerance    Home Living Family/patient expects to be discharged to:: Skilled nursing facility Living Arrangements: Children  Prior Function Level of Independence: Independent               Hand Dominance   Dominant Hand: Right    Extremity/Trunk Assessment   Upper Extremity Assessment: Defer to OT evaluation           Lower Extremity Assessment: Generalized  weakness      Cervical / Trunk Assessment: Normal  Communication   Communication: No difficulties  Cognition Arousal/Alertness: Awake/alert Behavior During Therapy: WFL for tasks assessed/performed Overall Cognitive Status: Within Functional Limits for tasks assessed                      General Comments      Exercises        Assessment/Plan    PT Assessment Patient needs continued PT services  PT Diagnosis Difficulty walking;Acute pain   PT Problem List Decreased strength;Decreased range of motion;Decreased activity tolerance;Decreased balance;Decreased mobility;Decreased knowledge of use of DME;Decreased safety awareness;Decreased knowledge of precautions;Pain  PT Treatment Interventions DME instruction;Gait training;Stair training;Functional mobility training;Therapeutic activities;Therapeutic exercise;Neuromuscular re-education;Patient/family education   PT Goals (Current goals can be found in the Care Plan section) Acute Rehab PT Goals Patient Stated Goal: Decrease pain PT Goal Formulation: With patient Time For Goal Achievement: 09/08/14 Potential to Achieve Goals: Good    Frequency Min 5X/week   Barriers to discharge        Co-evaluation               End of Session Equipment Utilized During Treatment: Gait belt;Oxygen Activity Tolerance: Patient limited by pain Patient left: in chair;with call bell/phone within reach Nurse Communication: Mobility status;Precautions;Other (comment) (Brace education)         Time: [id number] PT Time Calculation (min): 45 min   Charges:   PT Evaluation $Initial PT Evaluation Tier I: 1 Procedure PT Treatments $Gait Training: 8-22 mins $Therapeutic Activity: 23-37 mins   PT G Codes:          Jolyn Lent 09/01/2014, 5:19 PM  Jolyn Lent, PT, DPT Acute Rehabilitation Services Pager: [phone]

## 2014-09-01 NOTE — Progress Notes (Signed)
Subjective: 2 Days Post-Op Procedure(s) (LRB): POSTERIOR LUMBAR FUSION 1 LEVEL (N/A)  Patient seen today with the nurse in room. She is resting comfortably in bed at this time. They were having a difficult time controlling her pain last night. She was switched to Dilaudid and Robaxin and this appears to be working much better. Nurse states that the medical team has taken her off of one of her pressors during the night. They are going to continue to monitor her in regards to her elevated troponin's as she is currently asymptomatic. Nursing states that her bandages are clean, dry, and intact. She is moving all extremities and has normal sensation throughout.    Objective: Vital signs in last 24 hours: Temp:  [97 F (36.1 C)-100.8 F (38.2 C)] 97 F (36.1 C) (09/26 0739) Pulse Rate:  [16-100] 58 (09/26 0815) Resp:  [9-23] 10 (09/26 0815) BP: (60-153)/(33-88) 96/55 mmHg (09/26 0815) SpO2:  [89 %-100 %] 100 % (09/26 0815) Weight:  [113.2 kg (249 lb 9 oz)] 113.2 kg (249 lb 9 oz) (09/25 1115)  Labs:  Recent Labs  08/29/14 1341 08/31/14 0945  HGB 10.9* 11.8*    Recent Labs  08/29/14 1341 08/31/14 0945  WBC  --  11.3*  RBC  --  4.37  HCT 32.0* 36.0  PLT  --  125*    Recent Labs  08/29/14 1341 08/31/14 0945  NA 141 138  K 3.0* 4.6  CL  --  102  CO2  --  25  BUN  --  17  CREATININE  --  1.44*  GLUCOSE  --  91  CALCIUM  --  8.1*   No results found for this basename: LABPT, INR,  in the last 72 hours  Physical Exam:  Neurologically intact ABD soft Neurovascular intact Sensation intact distally Intact pulses distally Dorsiflexion/Plantar flexion intact Incision: dressing C/D/I No cellulitis present Compartment soft  Assessment/Plan:  2 Days Post-Op Procedure(s) (LRB): POSTERIOR LUMBAR FUSION 1 LEVEL (N/A) Continue Dilaudid and Robaxin for pain control.  We greatly appreciate medical management and recommendations.  Up with PT/OT as tolerated and if deemed safe  by medical team. D/C to home or SNF based on recommendations by PT and medical team once patient is stable and doing well.     NIDA, Larwance Sachs 09/01/2014, 8:28 AM

## 2014-09-01 NOTE — Progress Notes (Signed)
PT Cancellation Note  Patient Details Name: [name] MRN: [id number] DOB: [birth year]   Cancelled Treatment:    Reason Eval/Treat Not Completed: Medical issues which prohibited therapy. Pt with noted elevated troponin with no repeat draws yet done. Will hold PT eval at this time and check back for appropriateness as time allows.    Jolyn Lent 09/01/2014, 8:11 AM  Jolyn Lent, PT, DPT Acute Rehabilitation Services Pager: [phone]

## 2014-09-01 NOTE — Progress Notes (Signed)
PULMONARY / CRITICAL CARE MEDICINE   Name: Ellen Henry MRN: [id number] DOB: 04-05-1946    ADMISSION DATE:  08/29/2014 CONSULTATION DATE:  08/31/14  REFERRING MD :  Dr. Lynann Bologna   CHIEF COMPLAINT:  UTI, Hypotension   INITIAL PRESENTATION: 68 y/o AAF with a PMH of bilateral leg pain admitted 9/23 for planned two stage anterior L4-5 fusion. The patient returned to the OR on 9/24 for the posterior portion of the fusion.  Post-operatively 9/25, the patient developed hypotension with concerns for UTI.      STUDIES:  9/25  CXR >> left basilar linear densities, c/w atelectasis   SIGNIFICANT EVENTS: 9/23  Admit for two stage lumbar fusion secondary to L4-5 spinal stenosis, lumbar radiculopathy 9/24  Returned to OR for second phase of fusion (posterior) 9/25  Developed hypotension, urine worrisome for UTI, PCCM consulted.  Tx'd to ICU. Troponin elevated   9/26 off pressors. Awaiting culture data. Feels better w/ narc change   SUBJECTIVE:  No acute issues  VITAL SIGNS: Temp:  [97 F (36.1 C)-98.5 F (36.9 C)] 97 F (36.1 C) (09/26 0739) Pulse Rate:  [57-99] 75 (09/26 1100) Resp:  [9-23] 9 (09/26 1100) BP: (69-153)/(33-88) 96/62 mmHg (09/26 1100) SpO2:  [89 %-100 %] 100 % (09/26 1100)  HEMODYNAMICS: CVP:  [14 mmHg-21 mmHg] 18 mmHg  INTAKE / OUTPUT:  Intake/Output Summary (Last 24 hours) at 09/01/14 1124 Last data filed at 09/01/14 1100  Gross per 24 hour  Intake 5135.47 ml  Output   2245 ml  Net 2890.47 ml    PHYSICAL EXAMINATION: General:  Morbidly obese female in NAD  Neuro:  AAOx4, speech clear, MAE  HEENT:  Mm pink/moist, no jvd  Cardiovascular:  s1s2 rrr, no m/r/g  Lungs:  resp's even/non-labored, lungs clear  Abdomen:  Obese, soft, bsx4 active.  Abd incision c/d/i as is back  Musculoskeletal:  No acute deformities  Skin:  Warm/dry, no edema.  Posterior lower back incision with minimal bloody drainage on bandage, site without erythema or warmth.     LABS: PULMONARY  Recent Labs Lab 08/29/14 1341 08/31/14 1230  PHART 7.360  --   PCO2ART 42.4  --   PO2ART 90.0  --   HCO3 23.9  --   TCO2 25  --   O2SAT 97.0 36.6    CBC  Recent Labs Lab 08/29/14 1341 08/31/14 0945  HGB 10.9* 11.8*  HCT 32.0* 36.0  WBC  --  11.3*  PLT  --  125*    COAGULATION No results found for this basename: INR,  in the last 168 hours  CARDIAC   Recent Labs Lab 08/31/14 1200 09/01/14 1230  TROPONINI 2.69* 3.16*   No results found for this basename: PROBNP,  in the last 168 hours   CHEMISTRY  Recent Labs Lab 08/29/14 1341 08/31/14 0945 09/01/14 1230  NA 141 138 140  K 3.0* 4.6 3.9  CL  --  102 107  CO2  --  25 23  GLUCOSE  --  91 96  BUN  --  17 15  CREATININE  --  1.44* 0.76  CALCIUM  --  8.1* 8.3*   Estimated Creatinine Clearance: 80.4 ml/min (by C-G formula based on Cr of 0.76).   LIVER No results found for this basename: AST, ALT, ALKPHOS, BILITOT, PROT, ALBUMIN, INR,  in the last 168 hours   INFECTIOUS  Recent Labs Lab 08/31/14 0945 09/01/14 0500  LATICACIDVEN 1.6  --   PROCALCITON 2.40 3.82  ENDOCRINE CBG (last 3)  No results found for this basename: GLUCAP,  in the last 72 hours       IMAGING x48h Dg Chest Port 1 View  08/31/2014   CLINICAL DATA:  CVC scratch has Central line placement.  EXAM: PORTABLE CHEST - 1 VIEW  COMPARISON:  08/31/2014  FINDINGS: Left central line is in place. The tip projects lateral to the mediastinal contour and may be within the azygos vein. No pneumothorax. Bibasilar atelectasis. Heart is normal size. No effusions or acute bony abnormality.  IMPRESSION: Left central line tip possibly within the azygos vein. No pneumothorax.  Bibasilar atelectasis.   Electronically Signed   By: Rolm Baptise M.D.   On: 08/31/2014 11:58   Dg Chest Port 1 View  08/31/2014   CLINICAL DATA:  Fever.  Evaluate for pneumonia.  EXAM: PORTABLE CHEST - 1 VIEW  COMPARISON:  08/21/2014  FINDINGS:  Heart is upper limits normal in size. Linear densities in the lingula and left base, likely atelectasis. No confluent opacity on the right. No effusions or acute bony abnormality.  IMPRESSION: Left basilar linear densities, likely atelectasis.   Electronically Signed   By: Rolm Baptise M.D.   On: 08/31/2014 09:34       ASSESSMENT / PLAN:  INFECTIOUS A:   Urosepsis - concern for UTI as contribution to hypotension.  DDx includes hypovolemia + narcs.   Fever  P:   BCx2 9/25 >> UA 9/25 >> 11-20 WBC, neg nitrate, moderate leukocytes, RBC TNTC, & few bacteria. UC 9/25 >>  Day 2/x vancomycin, cefepime started 9/25 >> Likely can narrow abx in am 9/27  CARDIOVASCULAR Lt IJ TLC 9/25 >>   A:  Shock likely from UTI with sepsis vs volume depletion and pain meds (likely combination of both)  Elevated troponin. Likely demand ischemia ECG was nml on 9/25 P:  Hold Tribenzor Keep euvolemic Pressors as needed to keep MAP > 65 (weaning) Will repeat trop. Will need cardiology assessment prior to d/c   ORTHO  A: S/P Anterior / Posterior L 4/5 Lumbar Fusion  P: Rec's per Ortho PT as able  NEUROLOGIC A:   Post op pain control  P:   PRN robaxin, oxycodone, morphine for pain  PULMONARY A: Atelectasis P:   F/u CXR as needed Bronchial hygiene  RENAL A:   AKI - in setting of volume depletion, UTI and sepsis P:   Volume resuscitation as above Ensure adequate renal perfusion  Hold nephrotoxic agents  Trend BMP, sr cr, repeat this am   GASTROINTESTINAL A:   Morbid Obesity P:   Clear liquid diet as tolerated  Bowel regimen: dulcolax, colace, senokot -s PRN zofran for nausea  HEMATOLOGIC A:   Anemia  Thrombocytopenia - preop 282, 125 on 9/25 P:  Monitor CBC closely for H/H & platelet trend   ENDOCRINE A:   Hx of Gout   P:   Monitor, supportive care    FAMILY  - Family/patient updated last on 9/25 - By Day 7: Interdisciplinary Family Meeting v Palliative Care Meeting:   N/A   TODAY'S STAFF  SUMMARY: 68 y/o AAF admitted for two step L 4/5 fusion on 9/23.  Developed hypotension 9/25 with concerns for UTI.  Patient transferred to ICU for stabilization, TLC placed, got volume abx and pressors. Now off pressors. Feels better. Will repeat trop I. Allow her to get up. Can move out of ICU on 9/27 if no further issues.  Rest per NP  Marni Griffon ACNP 09/01/14   Dr. Brand Males, M.D., F.C.C.P Pulmonary and Critical Care Medicine Staff Physician [facility] Pager: [phone], If no answer or between  15:00h - 7:00h: call [phone]  09/01/2014 3:43 PM

## 2014-09-01 NOTE — Progress Notes (Signed)
OT Cancellation Note  Patient Details Name: Ellen Henry MRN: [id number] DOB: 08-21-1946   Cancelled Treatment:    Reason Eval/Treat Not Completed: Patient not medically ready - will reattempt.  Darlina Rumpf Welch, OTR/L [phone]  09/01/2014, 9:52 AM

## 2014-09-02 ENCOUNTER — Inpatient Hospital Stay (HOSPITAL_COMMUNITY): Payer: Medicare Other

## 2014-09-02 LAB — CBC
HCT: 21.8 % — ABNORMAL LOW (ref 36.0–46.0)
HCT: 22.4 % — ABNORMAL LOW (ref 36.0–46.0)
Hemoglobin: 7.4 g/dL — ABNORMAL LOW (ref 12.0–15.0)
Hemoglobin: 7.4 g/dL — ABNORMAL LOW (ref 12.0–15.0)
MCH: 27.6 pg (ref 26.0–34.0)
MCH: 27 pg (ref 26.0–34.0)
MCHC: 33.9 g/dL (ref 30.0–36.0)
MCHC: 33 g/dL (ref 30.0–36.0)
MCV: 81.3 fL (ref 78.0–100.0)
MCV: 81.8 fL (ref 78.0–100.0)
Platelets: 152 10*3/uL (ref 150–400)
Platelets: 154 10*3/uL (ref 150–400)
RBC: 2.68 MIL/uL — ABNORMAL LOW (ref 3.87–5.11)
RBC: 2.74 MIL/uL — ABNORMAL LOW (ref 3.87–5.11)
RDW: 15.1 % (ref 11.5–15.5)
RDW: 15.2 % (ref 11.5–15.5)
WBC: 8.1 10*3/uL (ref 4.0–10.5)
WBC: 8.4 10*3/uL (ref 4.0–10.5)

## 2014-09-02 LAB — COMPREHENSIVE METABOLIC PANEL
ALT: 14 U/L (ref 0–35)
AST: 37 U/L (ref 0–37)
Albumin: 2.3 g/dL — ABNORMAL LOW (ref 3.5–5.2)
Alkaline Phosphatase: 92 U/L (ref 39–117)
Anion gap: 11 (ref 5–15)
BUN: 11 mg/dL (ref 6–23)
CO2: 22 mEq/L (ref 19–32)
Calcium: 8.5 mg/dL (ref 8.4–10.5)
Chloride: 108 mEq/L (ref 96–112)
Creatinine, Ser: 0.65 mg/dL (ref 0.50–1.10)
GFR calc Af Amer: >90 mL/min (ref >90)
GFR calc non Af Amer: 89 mL/min — ABNORMAL LOW (ref >90)
Glucose, Bld: 96 mg/dL (ref 70–99)
Potassium: 3.6 mEq/L — ABNORMAL LOW (ref 3.7–5.3)
Sodium: 141 mEq/L (ref 137–147)
Total Bilirubin: 0.4 mg/dL (ref 0.3–1.2)
Total Protein: 5.7 g/dL — ABNORMAL LOW (ref 6.0–8.3)

## 2014-09-02 LAB — TROPONIN I
Troponin I: 1.84 ng/mL (ref <0.30)
Troponin I: 1.83 ng/mL (ref <0.30)

## 2014-09-02 LAB — PROCALCITONIN: Procalcitonin: 2.43 ng/mL

## 2014-09-02 LAB — HEMOGLOBIN AND HEMATOCRIT, BLOOD
HCT: 25.4 % — ABNORMAL LOW (ref 36.0–46.0)
Hemoglobin: 8.4 g/dL — ABNORMAL LOW (ref 12.0–15.0)

## 2014-09-02 LAB — PREPARE RBC (CROSSMATCH)

## 2014-09-02 MED ORDER — SODIUM CHLORIDE 0.9 % IV SOLN
Freq: Once | INTRAVENOUS | Status: AC
  Administered 2014-09-02: 15:00:00 via INTRAVENOUS

## 2014-09-02 MED ORDER — CEFPODOXIME PROXETIL 200 MG PO TABS
100.0000 mg | ORAL_TABLET | Freq: Two times a day (BID) | ORAL | Status: DC
  Administered 2014-09-02: 100 mg via ORAL
  Administered 2014-09-02: 200 mg via ORAL
  Administered 2014-09-03 – 2014-09-04 (×3): 100 mg via ORAL
  Filled 2014-09-02 (×6): qty 1

## 2014-09-02 MED ORDER — FLEET ENEMA 7-19 GM/118ML RE ENEM
1.0000 | ENEMA | Freq: Every day | RECTAL | Status: DC | PRN
  Filled 2014-09-02 (×2): qty 1

## 2014-09-02 MED ORDER — SODIUM CHLORIDE 0.9 % IJ SOLN
10.0000 mL | INTRAMUSCULAR | Status: DC | PRN
  Administered 2014-09-02: 20 mL
  Administered 2014-09-03 (×2): 10 mL
  Administered 2014-09-03: 20 mL
  Administered 2014-09-03: 10 mL

## 2014-09-02 MED ORDER — MAGNESIUM HYDROXIDE 400 MG/5ML PO SUSP
30.0000 mL | Freq: Every day | ORAL | Status: DC | PRN
  Administered 2014-09-02 – 2014-09-03 (×2): 30 mL via ORAL
  Filled 2014-09-02 (×2): qty 30

## 2014-09-02 MED ORDER — DEXTROSE 5 % IV SOLN
2.0000 g | INTRAVENOUS | Status: DC
  Filled 2014-09-02: qty 2

## 2014-09-02 NOTE — Progress Notes (Signed)
Subjective: 3 Days Post-Op Procedure(s) (LRB): POSTERIOR LUMBAR FUSION 1 LEVEL (N/A) Patient was seen today with the nurse in the room. It was time for her pain medication so she was given IV dilaudid and oral percocet. Patient was able to get up and ambulate with PT yesterday.  SHe was noted to have a drop in her Hgb last night but was asymptomatic and her dressings were C/D/I so the doctor on call just wanted to continue to monitor that patient.   Activity level:  wbat Diet tolerance:  ok Voiding:  Foley in place Patient reports pain as moderate.    Objective: Vital signs in last 24 hours: Temp:  [97.6 F (36.4 C)-98.6 F (37 C)] 98.6 F (37 C) (09/27 0742) Pulse Rate:  [75-102] 83 (09/27 0700) Resp:  [9-25] 11 (09/27 0700) BP: (85-115)/(55-84) 102/59 mmHg (09/27 0700) SpO2:  [95 %-100 %] 100 % (09/27 0700)  Labs:  Recent Labs  08/31/14 0945 09/02/14 0256 09/02/14 0344  HGB 11.8* 7.4* 7.4*    Recent Labs  09/02/14 0256 09/02/14 0344  WBC 8.1 8.4  RBC 2.68* 2.74*  HCT 21.8* 22.4*  PLT 152 154    Recent Labs  09/01/14 1230 09/02/14 0256  NA 140 141  K 3.9 3.6*  CL 107 108  CO2 23 22  BUN 15 11  CREATININE 0.76 0.65  GLUCOSE 96 96  CALCIUM 8.3* 8.5   No results found for this basename: LABPT, INR,  in the last 72 hours  Physical Exam:  Neurologically intact ABD soft Neurovascular intact Sensation intact distally Intact pulses distally Dorsiflexion/Plantar flexion intact Incision: dressing C/D/I and no drainage No cellulitis present Compartment soft  Assessment/Plan:  3 Days Post-Op Procedure(s) (LRB): POSTERIOR LUMBAR FUSION 1 LEVEL (N/A) Advance diet Up with therapy today and also try to get patient in chair and out of bed. Continue with current pain regimen. Hopefully the patient will be able to continue with PO meds and use less IV meds. D/C to home vs. SNF pending recommendations by medical team and PT once patient is stable.  We greatly  appreciate medical teams management and input.     NIDA, Larwance Sachs 09/02/2014, 10:01 AM

## 2014-09-02 NOTE — Progress Notes (Signed)
Pt unable to void post removal of her foley at . Pt attmepted to void x2 with no success. Bladderscan done showing 416. Paged provider on call, pt to get an in and out cath. In and out cath done. Will continue to monitor pt.

## 2014-09-02 NOTE — Progress Notes (Signed)
Report called to receiving nurse [phone]. Past medical history and present hospitalization reported. All questions answered and belongings transported with patient and family member at bedside. 1mg  Dilaudid administered to patient prior to transport for pain level of 10 out of 10 pain level. Reported to receiving nurse. 5W receiving nurse to follow up on pain level.

## 2014-09-02 NOTE — Progress Notes (Signed)
PULMONARY / CRITICAL CARE MEDICINE   Name: Ellen Henry MRN: [id number] DOB: 1946-03-24    ADMISSION DATE:  08/29/2014 CONSULTATION DATE:  08/31/14  REFERRING MD :  Dr. Lynann Bologna   CHIEF COMPLAINT:  UTI, Hypotension   INITIAL PRESENTATION: 68 y/o AAF with a PMH of bilateral leg pain admitted 9/23 for planned two stage anterior L4-5 fusion. The patient returned to the OR on 9/24 for the posterior portion of the fusion.  Post-operatively 9/25, the patient developed hypotension with concerns for UTI.      STUDIES:  9/25  CXR >> left basilar linear densities, c/w atelectasis   SIGNIFICANT EVENTS: 9/23  Admit for two stage lumbar fusion secondary to L4-5 spinal stenosis, lumbar radiculopathy 9/24  Returned to OR for second phase of fusion (posterior) 9/25  Developed hypotension, urine worrisome for UTI, PCCM consulted.  Tx'd to ICU. Troponin elevated   9/26 off pressors. Awaiting culture data. Feels better w/ narc change CEs elevated 9/27: cards called re: elevated CEs. No CP. nml EGG. Ready for transfer to tele. abx narrowed. Central line d/c. Getting blood for anemia in setting of elevated trop  SUBJECTIVE:  No acute issues, c/o constipation Cards called re: elevated CEs. No CP. nml EGG. Ready for transfer to tele. abx narrowed. Central line d/c. Getting blood for anemia in setting of elevated trop  VITAL SIGNS: Temp:  [97.7 F (36.5 C)-98.6 F (37 C)] 98.3 F (36.8 C) (09/27 1156) Pulse Rate:  [78-102] 81 (09/27 1300) Resp:  [9-25] 14 (09/27 1300) BP: (85-115)/(55-79) 112/69 mmHg (09/27 1300) SpO2:  [95 %-100 %] 100 % (09/27 1300) [place] HEMODYNAMICS: CVP:  [12 mmHg-16 mmHg] 16 mmHg  INTAKE / OUTPUT:  Intake/Output Summary (Last 24 hours) at 09/02/14 1358 Last data filed at 09/02/14 1300  Gross per 24 hour  Intake   3600 ml  Output   2225 ml  Net   1375 ml    PHYSICAL EXAMINATION: General:  Morbidly obese female in NAD  Neuro:  AAOx4, speech clear, MAE  HEENT:  Mm  pink/moist, no jvd  Cardiovascular:  s1s2 rrr, no m/r/g  Lungs:  Clear, decreased in bases  Abdomen:  Obese, soft, bsx4 active.  Abd incision c/d/i as is back  Musculoskeletal:  No acute deformities  Skin:  Warm/dry, no edema.  Posterior lower back incision with minimal bloody drainage on bandage, site without erythema or warmth.    LABS: PULMONARY  Recent Labs Lab 08/29/14 1341 08/31/14 1230  PHART 7.360  --   PCO2ART 42.4  --   PO2ART 90.0  --   HCO3 23.9  --   TCO2 25  --   O2SAT 97.0 36.6    CBC  Recent Labs Lab 08/31/14 0945 09/02/14 0256 09/02/14 0344  HGB 11.8* 7.4* 7.4*  HCT 36.0 21.8* 22.4*  WBC 11.3* 8.1 8.4  PLT 125* 152 154    COAGULATION No results found for this basename: INR,  in the last 168 hours  CARDIAC    Recent Labs Lab 08/31/14 1200 09/01/14 1230  TROPONINI 2.69* 3.16*   No results found for this basename: PROBNP,  in the last 168 hours   CHEMISTRY  Recent Labs Lab 08/29/14 1341 08/31/14 0945 09/01/14 1230 09/02/14 0256  NA 141 138 140 141  K 3.0* 4.6 3.9 3.6*  CL  --  102 107 108  CO2  --  25 23 22   GLUCOSE  --  91 96 96  BUN  --  17 15 11  CREATININE  --  1.44* 0.76 0.65  CALCIUM  --  8.1* 8.3* 8.5   Estimated Creatinine Clearance: 80.4 ml/min (by C-G formula based on Cr of 0.65).   LIVER  Recent Labs Lab 09/02/14 0256  AST 37  ALT 14  ALKPHOS 92  BILITOT 0.4  PROT 5.7*  ALBUMIN 2.3*     INFECTIOUS  Recent Labs Lab 08/31/14 0945 09/01/14 0500 09/02/14 0256  LATICACIDVEN 1.6  --   --   PROCALCITON 2.40 3.82 2.43     ENDOCRINE CBG (last 3)  No results found for this basename: GLUCAP,  in the last 72 hours    IMAGING x48h No results found.    ASSESSMENT / PLAN: S/P Anterior / Posterior L 4/5 Lumbar Fusion w/ Post-op pain  Urosepsis - concern for UTI as contribution to hypotension.  DDx includes hypovolemia + narcs.   Septic Shock (resolved) Hypovolemic shock (resolved) Elevated Trop I  w/ nml ECG: favor demand ischemia  AKI - in setting of volume depletion, UTI and sepsis Post-op anemia  Thrombocytopenia Morbid Obesity Constipation    Plan:   F/u BCx2 9/25 >> Will narrow and rx as complicated UTI, plan 7 days total have switched to [employer] euvolemic Will repeat trop.& ECG. Have asked cards to eval  PT as able PRN robaxin, oxycodone, morphine for pain Adv diet as tol  Bowel regimen: dulcolax, colace, senokot -s, add LOC PRN zofran for nausea Transfuse for hgb > 8  Monitor CBC closely for H/H & platelet trend     TODAY'S STAFF  SUMMARY: 68 y/o AAF admitted for two step L 4/5 fusion on 9/23.  Developed hypotension 9/25 with concerns for UTI.  Patient transferred to ICU for stabilization, TLC placed, got volume abx and pressors. Now off pressors. Feels better. Troponins elevated, but no CP and ECG was nml. She is hemodynamically stable to go to ward w/ tele. Have asked Cards to see. Have narrowed her ABX to oral/ she can finish a 7d course. Will transfuse as her hgb is < 8 in setting of elevated Trop I. She is clear to continue rehab efforts. Will defer to cards for further recs. Critical care will s/o.  REcommend [facility] Hospitalist follow patient 09/03/14 (signed off to Dr Sherral Hammers). Rest per NP  Marni Griffon ACNP 09/02/2014 1:58 PM   Dr. Brand Males, M.D., [facility].C.P Pulmonary and Critical Care Medicine Staff Physician [facility] Pager: [phone], If no answer or between  15:00h - 7:00h: call [phone]  09/02/2014 4:04 PM

## 2014-09-02 NOTE — Progress Notes (Signed)
NURSING PROGRESS NOTE  Ellen Henry [id number] Transfer Data: 09/02/2014 6:24 PM Attending Provider: Sinclair Ship, MD DJS:HFWYO,VZCHY J, MD Code Status: full   Ellen Henry is a 68 y.o. female patient transferred from 35M  -No acute distress noted.  -No complaints of shortness of breath.  -No complaints of chest pain.   Cardiac Monitoring: Box # 13 in place. Cardiac monitor yields:normal sinus rhythm.  Blood pressure 110/75, pulse 90, temperature 98.7 F (37.1 C), temperature source Oral, resp. rate 16, height 5\' 3"  (1.6 m), weight 113.2 kg (249 lb 9 oz), SpO2 100.00%.   IV Fluids:  IV in place, occlusive dsg intact without redness, IV cath forearm left, condition patent and no redness normal saline.   Allergies:  Food allergy formula  Past Medical History:   has a past medical history of Hypertension; Arthritis; and Gout.  Past Surgical History:   has past surgical history that includes Replacement total knee (2010); Appendectomy; Tonsillectomy; Joint replacement; Abdominal hysterectomy; Carpal tunnel release (Right); Anterior lumbar fusion (N/A, 08/29/2014); and Abdominal exposure (N/A, 08/29/2014).  Social History:   reports that she has been smoking Cigarettes.  She has a 1.25 pack-year smoking history. She has never used smokeless tobacco. She reports that she does not drink alcohol or use illicit drugs.  Skin: dressings to back and stomach from lumbar surgery  Patient/Family orientated to room. Information packet given to patient/family. Admission inpatient armband information verified with patient/family to include name and date of birth and placed on patient arm. Side rails up x 2, fall assessment and education completed with patient/family. Patient/family able to verbalize understanding of risk associated with falls and verbalized understanding to call for assistance before getting out of bed. Call light within reach. Patient/family able to voice and demonstrate  understanding of unit orientation instructions.    Will continue to evaluate and treat per MD orders.

## 2014-09-02 NOTE — Plan of Care (Signed)
Problem: Phase III Progression Outcomes Goal: Pain controlled on oral analgesia Outcome: Not Progressing Still requiring IV dilaudid for breakthrough pain. At 2200, patient found crying in pain and was not due for any pain medication. Elink notified and MD ordered an additional once dose of dilaudid at this time. This dose helped patient sleep. Woke patient to assess for pain q2h, denied pain at 0000 and 0200. At 0330, while turning, patient was having "intense sharp pain" so dilaudid was administered at this time. At 0630 patient having the same intense pain and robaxin and dilaudid given at this time. Educated patient about importance of moving towards PO pain medication.

## 2014-09-02 NOTE — Progress Notes (Signed)
[facility] Progress Note Patient Name: LARANDA BURKEMPER DOB: 02-10-1946 MRN: [id number]   Date of Service  09/02/2014  HPI/Events of Note  Large hemoglobin drop noted Hemodynamically stable, per RN patient looks good, no sign of bleeding  eICU Interventions  Stat repeat H/H     Intervention Category Major Interventions: Hemorrhage - evaluation and management  MCQUAID, DOUGLAS 09/02/2014, 3:41 AM

## 2014-09-02 NOTE — Progress Notes (Signed)
Results for UNA, YEOMANS (MRN [id number]) as of 09/02/2014 06:07  Ref. Range 09/02/2014 02:56 09/02/2014 03:44  Hemoglobin Latest Range: 12.0-15.0 g/dL 7.4 (L) 7.4 (L)  HCT Latest Range: 36.0-46.0 % 21.8 (L) 22.4 (L)   Notified Dr. Lake Bells of H/H. Stat repeat done with same results. No blood transfusion at this time. Patient is hemodynamically stable. Dressings c/d/i, no s/s bleeding. Patient is a&ox4.

## 2014-09-03 DIAGNOSIS — I10 Essential (primary) hypertension: Secondary | ICD-10-CM

## 2014-09-03 DIAGNOSIS — I369 Nonrheumatic tricuspid valve disorder, unspecified: Secondary | ICD-10-CM

## 2014-09-03 DIAGNOSIS — N179 Acute kidney failure, unspecified: Secondary | ICD-10-CM

## 2014-09-03 DIAGNOSIS — R7989 Other specified abnormal findings of blood chemistry: Secondary | ICD-10-CM

## 2014-09-03 LAB — CBC
HCT: 24.9 % — ABNORMAL LOW (ref 36.0–46.0)
Hemoglobin: 8.2 g/dL — ABNORMAL LOW (ref 12.0–15.0)
MCH: 26.7 pg (ref 26.0–34.0)
MCHC: 32.9 g/dL (ref 30.0–36.0)
MCV: 81.1 fL (ref 78.0–100.0)
Platelets: 203 10*3/uL (ref 150–400)
RBC: 3.07 MIL/uL — ABNORMAL LOW (ref 3.87–5.11)
RDW: 15.3 % (ref 11.5–15.5)
WBC: 6.5 10*3/uL (ref 4.0–10.5)

## 2014-09-03 LAB — COMPREHENSIVE METABOLIC PANEL
ALT: 17 U/L (ref 0–35)
AST: 36 U/L (ref 0–37)
Albumin: 2.3 g/dL — ABNORMAL LOW (ref 3.5–5.2)
Alkaline Phosphatase: 110 U/L (ref 39–117)
Anion gap: 10 (ref 5–15)
BUN: 8 mg/dL (ref 6–23)
CO2: 23 mEq/L (ref 19–32)
Calcium: 9 mg/dL (ref 8.4–10.5)
Chloride: 108 mEq/L (ref 96–112)
Creatinine, Ser: 0.6 mg/dL (ref 0.50–1.10)
GFR calc Af Amer: >90 mL/min (ref >90)
GFR calc non Af Amer: >90 mL/min (ref >90)
Glucose, Bld: 89 mg/dL (ref 70–99)
Potassium: 3.5 mEq/L — ABNORMAL LOW (ref 3.7–5.3)
Sodium: 141 mEq/L (ref 137–147)
Total Bilirubin: 0.5 mg/dL (ref 0.3–1.2)
Total Protein: 6 g/dL (ref 6.0–8.3)

## 2014-09-03 LAB — POCT I-STAT 4, (NA,K, GLUC, HGB,HCT)
Glucose, Bld: 125 mg/dL — ABNORMAL HIGH (ref 70–99)
HCT: 29 % — ABNORMAL LOW (ref 36.0–46.0)
Hemoglobin: 9.9 g/dL — ABNORMAL LOW (ref 12.0–15.0)
Potassium: 3.1 mEq/L — ABNORMAL LOW (ref 3.7–5.3)
Sodium: 141 mEq/L (ref 137–147)

## 2014-09-03 LAB — PRO B NATRIURETIC PEPTIDE: Pro B Natriuretic peptide (BNP): 2527 pg/mL — ABNORMAL HIGH (ref 0–125)

## 2014-09-03 LAB — MAGNESIUM: Magnesium: 1.8 mg/dL (ref 1.5–2.5)

## 2014-09-03 LAB — TROPONIN I: Troponin I: 1.41 ng/mL (ref <0.30)

## 2014-09-03 LAB — PHOSPHORUS: Phosphorus: 2.1 mg/dL — ABNORMAL LOW (ref 2.3–4.6)

## 2014-09-03 MED ORDER — ATORVASTATIN CALCIUM 40 MG PO TABS
40.0000 mg | ORAL_TABLET | Freq: Every day | ORAL | Status: DC
  Administered 2014-09-03: 40 mg via ORAL
  Filled 2014-09-03 (×2): qty 1

## 2014-09-03 MED ORDER — POLYETHYLENE GLYCOL 3350 17 G PO PACK
17.0000 g | PACK | Freq: Every day | ORAL | Status: DC
  Administered 2014-09-04: 17 g via ORAL
  Filled 2014-09-03 (×2): qty 1

## 2014-09-03 MED ORDER — ASPIRIN EC 81 MG PO TBEC
81.0000 mg | DELAYED_RELEASE_TABLET | Freq: Every day | ORAL | Status: DC
  Administered 2014-09-03 – 2014-09-04 (×2): 81 mg via ORAL
  Filled 2014-09-03 (×3): qty 1

## 2014-09-03 MED ORDER — SENNOSIDES-DOCUSATE SODIUM 8.6-50 MG PO TABS
2.0000 | ORAL_TABLET | Freq: Two times a day (BID) | ORAL | Status: DC
  Administered 2014-09-03 – 2014-09-04 (×3): 2 via ORAL
  Filled 2014-09-03 (×3): qty 2

## 2014-09-03 MED ORDER — OXYCODONE HCL ER 10 MG PO T12A
20.0000 mg | EXTENDED_RELEASE_TABLET | Freq: Two times a day (BID) | ORAL | Status: DC
Start: 2014-09-03 — End: 2014-09-04
  Administered 2014-09-03 (×2): 20 mg via ORAL
  Filled 2014-09-03 (×3): qty 2

## 2014-09-03 NOTE — Consult Note (Signed)
CARDIOLOGY CONSULT NOTE     Patient ID: Ellen Henry MRN: [id number] DOB/AGE: 1945-12-08 68 y.o.  Admit date: 08/29/2014 Referring Physician Royanne Foots MD Primary Physician Elyn Peers, MD Primary Cardiologist N/A Reason for Consultation elevated troponin.  HPI:  Ellen Henry is a pleasant 68 yo BF admitted on 9/23 for anterior L4-5 fusion with disc insertion. On 9/24 she returned to the OR for posterior L4-5 fusion/decompression. On 9/25 she developed profound hypotension with systolic BP of 70, fever to 103. She was transferred to the ICU and central line was placed. She was treated with IV fluid boluses, IV antibiotics for urosepsis, and was transfused for severe anemia Hgb 7.4. She responded well to treatment and was transferred to floor yesterday. During this episode she has elevation in troponin to peak 3.16. Ecg showed mild nonspecific T wave flattening inferolaterally. She had no chest pain or dyspnea. She has cardiac risk factors of HTN, hypercholesterolemia, and tobacco abuse. She states she was on Crestor in the past but stopped it due to cost. She states Dr. Criss Rosales did a stress test a couple of years ago that was OK. No family history of CAD.  Past Medical History  Diagnosis Date  . Hypertension   . Arthritis   . Gout     Family History  Problem Relation Age of Onset  . Varicose Veins Mother     History   Social History  . Marital Status: Widowed    Spouse Name: N/A    Number of Children: N/A  . Years of Education: N/A   Occupational History  . Not on file.   Social History Main Topics  . Smoking status: Current Some Day Smoker -- 0.25 packs/day for 5 years    Types: Cigarettes  . Smokeless tobacco: Never Used  . Alcohol Use: No  . Drug Use: No  . Sexual Activity: No   Other Topics Concern  . Not on file   Social History Narrative  . No narrative on file    Past Surgical History  Procedure Laterality Date  . Replacement total knee  2010   rigth knee  . Appendectomy    . Tonsillectomy    . Joint replacement    . Abdominal hysterectomy    . Carpal tunnel release Right     release done twice  . Anterior lumbar fusion N/A 08/29/2014    Procedure: ANTERIOR LUMBAR FUSION 1 LEVEL;  Surgeon: Sinclair Ship, MD;  Location: [facility];  Service: Orthopedics;  Laterality: N/A;  Lumbar 4-5 anterior lumbar interbody fusion with allograft and instrumentation.  . Abdominal exposure N/A 08/29/2014    Procedure: ABDOMINAL EXPOSURE;  Surgeon: Rosetta Posner, MD;  Location: [facility] OR;  Service: Vascular;  Laterality: N/A;     Prescriptions prior to admission  Medication Sig Dispense Refill  . gabapentin (NEURONTIN) 600 MG tablet Take 600 mg by mouth 3 (three) times daily.      . Olmesartan-Amlodipine-HCTZ (TRIBENZOR) 20-5-12.5 MG TABS Take 1 tablet by mouth daily.      . [DISCONTINUED] traMADol (ULTRAM) 50 MG tablet Take 100 mg by mouth every 8 (eight) hours as needed (pain).      . colchicine 0.6 MG tablet Take 0.6 mg by mouth daily as needed. For gout         ROS: As noted in HPI. Patient reports she has not had a BM since surgery. Abdomen feels "full". All other systems are reviewed and are negative unless otherwise mentioned.  Physical Exam: Blood pressure 124/73, pulse 82, temperature 98 F (36.7 C), temperature source Oral, resp. rate 18, height 5\' 3"  (1.6 m), weight 249 lb 9 oz (113.2 kg), SpO2 100.00%. Current Weight  08/31/14 249 lb 9 oz (113.2 kg)  08/31/14 249 lb 9 oz (113.2 kg)  08/31/14 249 lb 9 oz (113.2 kg)      Labs:   Lab Results  Component Value Date   WBC 6.5 09/03/2014   HGB 8.2* 09/03/2014   HCT 24.9* 09/03/2014   MCV 81.1 09/03/2014   PLT 203 09/03/2014    Recent Labs Lab 09/03/14 0416  NA 141  K 3.5*  CL 108  CO2 23  BUN 8  CREATININE 0.60  CALCIUM 9.0  PROT 6.0  BILITOT 0.5  ALKPHOS 110  ALT 17  AST 36  GLUCOSE 89   Lab Results  Component Value Date   CKTOTAL 1044* 11/29/2009   CKTOTAL 1108*  11/29/2009   CKTOTAL 977* 11/29/2009   CKMB 24.5* 11/29/2009   CKMB 30.1* 11/29/2009   CKMB 31.1* 11/29/2009   TROPONINI 1.41* 09/03/2014   TROPONINI 1.83* 09/02/2014   TROPONINI 1.84* 09/02/2014    No results found for this basename: CHOL   No results found for this basename: HDL   No results found for this basename: LDLCALC   No results found for this basename: TRIG   No results found for this basename: CHOLHDL   No results found for this basename: LDLDIRECT    Lab Results  Component Value Date   PROBNP 2527.0* 09/03/2014   PROBNP 41.0 11/28/2009   Lab Results  Component Value Date   TSH 0.647 Test methodology is 3rd generation TSH 11/30/2009   No results found for this basename: HGBA1C    Radiology: Dg Chest Port 1 View  09/02/2014   CLINICAL DATA:  Respiratory distress, history hypertension  EXAM: PORTABLE CHEST - 1 VIEW  COMPARISON:  Portable exam 0522 hr compared to 08/31/2014  FINDINGS: LEFT jugular central venous catheter tip projecting over SVC and confluence of azygos vein.  Enlargement of cardiac silhouette.  Mediastinal contours and pulmonary vascularity normal.  Lungs clear.  No pleural effusion, pneumothorax or acute osseous findings.  IMPRESSION: No acute abnormalities.  Enlargement of cardiac silhouette.   Electronically Signed   By: Lavonia Dana M.D.   On: 09/02/2014 08:07    EKG: NSR with nonspecific TWA inferolaterally.  ASSESSMENT AND PLAN:  1. Demand ischemia with elevated troponin related to profound hypotension, urosepsis, fever, and anemia. Ecg does not indicate MI. No chest pain. Recommend ASA 81 mg daily. Resume antihypertensives as BP allows. Start statin with atorvastatin. Will obtain Echo today. If LV function is normal I would consider outpatient Myoview testing once she has recovered from her back surgery given these recent events and multiple risk factors. If Echo shows significant LV dysfunction may need to consider for cardiac cath. 2. S/p  anterior and posterior lumbar fusion 3. Urosepsis. 4. Anemia. 5. HTN 6. Hypercholesterolemia. 7. Tobacco abuse- recommend smoking cessation.   Signed: Peter Martinique, [place]  09/03/2014, 10:56 AM

## 2014-09-03 NOTE — Clinical Social Work Psychosocial (Signed)
Clinical Social Work Department BRIEF PSYCHOSOCIAL ASSESSMENT 09/03/2014  Patient:  Ellen Henry,Ellen Henry     Account Number:  [id number]     Admit date:  08/29/2014  Clinical Social Worker:  CAMPBELL,JOSEPH BRYANT, LCSWA  Date/Time:  09/03/2014 03:30 PM  Referred by:  Physician  Date Referred:  09/03/2014 Referred for  SNF Placement   Other Referral:   NA   Interview type:  Patient Other interview type:   Patient and alert and oriented at time of assessment.    PSYCHOSOCIAL DATA Living Status:  FAMILY Admitted from facility:   Level of care:   Primary support name:  Ellen Henry and Ellen Henry Primary support relationship to patient:  FAMILY Degree of support available:   Support is strong. Patient lives with daughter Ellen Henry.    CURRENT CONCERNS Current Concerns  Post-Acute Placement   Other Concerns:   NA    SOCIAL WORK ASSESSMENT / PLAN CSW met with patient at bedside to complete assessment. Patient states that she lives with her daughter Ellen Henry, but understands that she needs SNF placement at this time. Patient's daughters have a preference for [facility] or [facility]. CSW explained SNF search/placement process and answered questions. Patient appeared calm and engaged in assessment.   Assessment/plan status:  Psychosocial Support/Ongoing Assessment of Needs Other assessment/ plan:   Complete Fl2, Fax, PASRR   Information/referral to community resources:   CSW contact information and SNF list given.    PATIENT'S/FAMILY'S RESPONSE TO PLAN OF CARE: Patient agreeable to SNF at discharge. CSW will assist.        Bryant Campbell MSW, LCSWA, LCASA, [phone] 

## 2014-09-03 NOTE — Progress Notes (Signed)
Physical Therapy Treatment Patient Details Name: MELYSA SCHROYER MRN: [id number] DOB: [birth year] Today's Date: 09/03/2014    History of Present Illness Pt is a 68 y/o F with a PMH of bilateral leg pain admitted 9/23 for planned two stage anterior L4-5 fusion. The patient returned to the OR on 9/24 for the posterior portion of the fusion. Post-operatively 9/25, the patient developed hypotension with concerns for UTI.    PT Comments    Pt did well today.  Ambulated with RW and brace for 30 feet.  Nursing shown how to don brace.  Pt cooperative and pleasant.   Her pain was 8/10 and nursing gave her pain meds during treatment.  Pt still needs +2 assist for her and staff safety.  Follow Up Recommendations  SNF;Supervision/Assistance - 24 hour     Equipment Recommendations  3in1 (PT);Rolling walker with 5" wheels    Recommendations for Other Services       Precautions / Restrictions Precautions Precautions: Fall;Back Precaution Comments: Reveiwed back precautions Required Braces or Orthoses: Spinal Brace Spinal Brace: Applied in supine position Spinal Brace Comments: doctor came by and verified pt can put brace on in sitting or standing. Restrictions Weight Bearing Restrictions: No    Mobility  Bed Mobility                  Transfers Overall transfer level: Needs assistance Equipment used: Rolling walker (2 wheeled) Transfers: Sit to/from Stand Sit to Stand: +2 physical assistance;+2 safety/equipment;Min assist         General transfer comment: pt was up in chair when I arrived.  Pt needed cues for hand placement to stand  Ambulation/Gait Ambulation/Gait assistance: Min assist Ambulation Distance (Feet): 30 Feet Assistive device: Rolling walker (2 wheeled) Gait Pattern/deviations: Step-through pattern;Decreased stride length;Narrow base of support         Stairs            Wheelchair Mobility    Modified Rankin (Stroke Patients Only)        Balance                                    Cognition Arousal/Alertness: Awake/alert Behavior During Therapy: WFL for tasks assessed/performed Overall Cognitive Status: Within Functional Limits for tasks assessed                      Exercises      General Comments        Pertinent Vitals/Pain Pain Assessment: 0-10 Pain Score: 8  Pain Location: lumbar spine Pain Descriptors / Indicators: Aching;Burning Pain Intervention(s): Repositioned;Patient requesting pain meds-RN notified;RN gave pain meds during session    Home Living                      Prior Function            PT Goals (current goals can now be found in the care plan section) Progress towards PT goals: Progressing toward goals    Frequency  Min 5X/week    PT Plan Current plan remains appropriate    Co-evaluation             End of Session Equipment Utilized During Treatment: Gait belt Activity Tolerance: Patient tolerated treatment well Patient left: in chair;with call bell/phone within reach;with family/visitor present (pt son and daughter came at end of treatment session)     Time: [phone]  PT Time Calculation (min): 25 min  Charges:  $Gait Training: 23-37 mins                    G Codes:      Loyal Buba 09/03/2014, 11:54 AM 09/03/2014   Rande Lawman, PT

## 2014-09-03 NOTE — Care Management Note (Signed)
    Page 1 of 1   09/04/2014     3:31:51 PM CARE MANAGEMENT NOTE 09/04/2014  Patient:  Ellen Henry, Ellen Henry   Account Number:  [id number]  Date Initiated:  09/03/2014  Documentation initiated by:  Tomi Bamberger  Subjective/Objective Assessment:   dx bil leg pain  admit-from home.     Action/Plan:   pt eval- rec SNF.   Anticipated DC Date:  09/04/2014   Anticipated DC Plan:  SKILLED NURSING FACILITY  In-house referral  Clinical Social Worker      DC Planning Services  CM consult      Choice offered to / List presented to:             Status of service:  Completed, signed off Medicare Important Message given?  YES (If response is "NO", the following Medicare IM given date fields will be blank) Date Medicare IM given:  09/03/2014 Medicare IM given by:  Tomi Bamberger Date Additional Medicare IM given:   Additional Medicare IM given by:    Discharge Disposition:  [facility]  Per UR Regulation:  Reviewed for med. necessity/level of care/duration of stay  If discussed at [place] of Stay Meetings, dates discussed:    Comments:

## 2014-09-03 NOTE — Evaluation (Signed)
Occupational Therapy Evaluation and Discharge Patient Details Name: Ellen Henry MRN: [id number] DOB: 1946-12-02 Today's Date: 09/03/2014    History of Present Illness Pt is a 68 y/o F with a PMH of bilateral leg pain admitted 9/23 for planned two stage anterior L4-5 fusion. The patient returned to the OR on 9/24 for the posterior portion of the fusion. Post-operatively 9/25, the patient developed hypotension with concerns for UTI.   Clinical Impression   This 68 yo female admitted and underwent above presents to acute OT with increased pain, decreased mobility, obesity, back precautions, TLSO with leg component, all affecting her ability to be able to care for herself at home. She will benefit from continued OT at SNF, acute OT will sign off.    Follow Up Recommendations  SNF    Equipment Recommendations   (TBD at next venue)       Precautions / Restrictions Precautions Precautions: Fall;Back Precaution Comments: Reveiwed back precautions Required Braces or Orthoses: Spinal Brace Spinal Brace: Thoracolumbosacral orthotic;Applied in sitting position;Applied in standing position Spinal Brace Comments: can apply brace in sitting or standing and can have off to get up to [facility]. Restrictions Weight Bearing Restrictions: No      Mobility Bed Mobility Overal bed mobility: Needs Assistance Bed Mobility: Sit to Supine       Sit to supine: Total assist;+2 for physical assistance      Transfers Overall transfer level: Needs assistance Equipment used: Rolling walker (2 wheeled) Transfers: Sit to/from Stand Sit to Stand: Min assist            Balance Overall balance assessment: Needs assistance Sitting-balance support: Feet supported;No upper extremity supported Sitting balance-Leahy Scale: Fair     Standing balance support: No upper extremity supported Standing balance-Leahy Scale: Fair                              ADL Overall ADL's : Needs  assistance/impaired Eating/Feeding: Independent;Sitting   Grooming: Set up;Sitting   Upper Body Bathing: Minimal assitance;Sitting (due to body habitus)   Lower Body Bathing: Total assistance (with min A sit<>stand)   Upper Body Dressing : Minimal assistance;Sitting   Lower Body Dressing: Total assistance (with min A sit<>stand)   Toilet Transfer: Minimal assistance;Ambulation;RW;BSC   Toileting- Clothing Manipulation and Hygiene: Total assistance (with min A sit<>stand)                         Pertinent Vitals/Pain Pain Assessment: 0-10 Pain Score: 5  Pain Location: back Pain Descriptors / Indicators: Aching;Burning Pain Intervention(s): Repositioned        Extremity/Trunk Assessment Upper Extremity Assessment Upper Extremity Assessment: Overall WFL for tasks assessed              Cognition Arousal/Alertness: Awake/alert Behavior During Therapy: WFL for tasks assessed/performed Overall Cognitive Status: Within Functional Limits for tasks assessed                                Home Living Family/patient expects to be discharged to:: Skilled nursing facility                                             OT Diagnosis: Generalized weakness;Acute pain   OT Problem List:  Obesity;Decreased knowledge of precautions;Decreased knowledge of use of DME or AE;Impaired balance (sitting and/or standing);Pain      OT Goals(Current goals can be found in the care plan section) Acute Rehab OT Goals Patient Stated Goal: to rehab then home  OT Frequency:                End of Session Equipment Utilized During Treatment: Rolling walker;Back brace  Activity Tolerance: Patient tolerated treatment well Patient left: in bed;with call bell/phone within reach   Time: 1131-1201 OT Time Calculation (min): 30 min Charges:  OT General Charges $OT Visit: 1 Procedure OT Treatments $Self Care/Home Management : 23-37 mins  Almon Register [phone] 09/03/2014, 1:26 PM

## 2014-09-03 NOTE — Progress Notes (Signed)
    Patient 4 days PO Ant/Post lumbar fusion. Leg pain continues to be resolved. PO LBP improved with px meds back on board. Pt has been up with PT/OT and progressing well. Some difficulties with brace and SNF is recommended. Pt has been cleared by medicine team, is being worked up by cardiology and pending echo. Cards will sign off on D/C to SNF and outpt f/u pending echo. Pt reports constipation likely secondary to meds. Good appetite. Denies ch px, SOB, Abd px.   Physical Exam: BP 120/80  Pulse 90  Temp(Src) 98.4 F (36.9 C) (Oral)  Resp 18  Ht 5\' 3"  (1.6 m)  Wt 113.2 kg (249 lb 9 oz)  BMI 44.22 kg/m2  SpO2 100% CBC Latest Ref Rng 09/03/2014 09/02/2014 09/02/2014  WBC 4.0 - 10.5 K/uL 6.5 - 8.4  Hemoglobin 12.0 - 15.0 g/dL 8.2(L) 8.4(L) 7.4(L)  Hematocrit 36.0 - 46.0 % 24.9(L) 25.4(L) 22.4(L)  Platelets 150 - 400 K/uL 203 - 154  Hg stable and improved s/p transfusion   Pt sitting up, TLSO in place worn appropriately, ANT/POST dressing CDI, Pt appears much more comfortable today in comparison to Friday.  NVI  POD #4 s/p Ant/Post L4-5 Fusion  - Resolved leg pain - Expected PO LBP improved with current px regimen  -Oxycontin Q12hrs, Percocet for breakthrough px, Robaxin for muscle spasms  - NO NSAIDS for 87mo secondary to fusion (Low dose ASA OK to resume tomorrow) - up with PT/OT, encourage ambulation  - TLSO at all times when OOB except trips to bathroom, OK to apply seated or standing  -SNF recommended and appropriate   -Social work consulted to start placement  - Cards following, elevated troponin believed secondary to euvolemia.   -Pt pending echo, if OK will sign off for outpt f/u  -Resume HTN agents as BP allows - HG stabilizing after transfusion, pt asymptomatic at this time - likely d/c to SNF pending cards clearance, cleared by medical team and ortho

## 2014-09-03 NOTE — Progress Notes (Signed)
  Echocardiogram 2D Echocardiogram has been performed.  Ellen Henry 09/03/2014, 12:50 PM

## 2014-09-03 NOTE — Progress Notes (Signed)
PATIENT DETAILS Name: Ellen Henry Age: 68 y.o. Sex: female Date of Birth: September 05, 1946 Admit Date: 08/29/2014 Admitting Physician Sinclair Ship, MD PYK:DXIPJ,ASNKN J, MD  Brief Summary 68 y/o AAF with a PMH of bilateral leg pain admitted 9/23 for planned two stage anterior L4-5 fusion. The patient returned to the OR on 9/24 for the posterior portion of the fusion. Post-operatively 9/25, the patient developed hypotension with concerns for UTI. Patient was managed in the ICU with IV fluids, IV antibiotics and pressors. Transferred to the hospitalist service on 9/27  Subjective: Feels better-ambulated in room today.  Assessment/Plan: Principal Problem: Shock -Multifactorial likely secondary to urosepsis, and hypovolemia. - Resolved with IV fluids, IV antibiotics and pressors- blood pressure now stable.  Active Problems: UTI - UA suggestive of UTI, however urine cultures negative (received antibiotics), was on broad-spectrum antibiotics with vancomycin and cefepime-currently on Vantin-stop date 09/06/14. Remains afebrile and without leukocytosis.  Elevated troponin -likely secondary to demand ischemia rather than non-STEMI - Check echocardiogram, cardiology consultation obtained  Anemia - Anemia likely is multifactorial-secondary to perioperative blood loss, worsened by acute illness, IV fluid resuscitation. Status post blood transfusion on 9/27. Hemoglobin stable at 8.2. We'll continue to monitor and follow periodically. If has further drops in hemoglobin, will obtain anemia panel-but patient is status post blood transfusion.  Acute renal failure - Likely prerenal, resolved with supportive care. Monitor electrolytes periodically  S/P Anterior / Posterior L 4/5 Lumbar Fusion w/ Post-op pain  - Currently on as needed narcotics-still with significant pain better-will start long-acting OxyContin, and use Percocet or Dilaudid for breakthrough pain.Place on bowel regimen  with Miralax/Senokot.Ortho following-suspect will require SNF on sicharge  HTN - Blood pressure currently controlled without the use of any antihypertensive medications- was on olmesartan/amlodipine/Hctz- monitor and restart accordingly.  Morbid obesity  Disposition: Remain inpatient  DVT Prophylaxis:  SCD's  Code Status: Full code  Family Communication None at bedside  Procedures: L4-5 posterior spinal fusion- on9/24  CONSULTS:  pulmonary/intensive care and orthopedic surgery  Time spent 40 minutes-which includes 50% of the time with face-to-face with patient/ family and coordinating care related to the above assessment and plan.    MEDICATIONS: Scheduled Meds: . antiseptic oral rinse  7 mL Mouth Rinse BID  . cefpodoxime  100 mg Oral Q12H  . docusate sodium  100 mg Oral BID  . gabapentin  600 mg Oral TID   Continuous Infusions:  PRN Meds:.acetaminophen, acetaminophen, alum & mag hydroxide-simeth, bisacodyl, HYDROmorphone (DILAUDID) injection, magnesium hydroxide, menthol-cetylpyridinium, methocarbamol, ondansetron (ZOFRAN) IV, oxyCODONE-acetaminophen, phenol, senna-docusate, sodium chloride, sodium phosphate, zolpidem  Antibiotics: Anti-infectives   Start     Dose/Rate Route Frequency Ordered Stop   09/02/14 1800  cefTRIAXone (ROCEPHIN) 2 g in dextrose 5 % 50 mL IVPB  Status:  Discontinued     2 g 100 mL/hr over 30 Minutes Intravenous Every 24 hours 09/02/14 1346 09/02/14 1351   09/02/14 1400  cefpodoxime (VANTIN) tablet 100 mg     100 mg Oral Every 12 hours 09/02/14 1351 09/06/14 2359   09/02/14 0934  ceFEPIme (MAXIPIME) 2 g in dextrose 5 % 50 mL IVPB  Status:  Discontinued     2 g 100 mL/hr over 30 Minutes Intravenous Every 24 hours 09/01/14 1327 09/01/14 1349   09/01/14 2200  ceFEPIme (MAXIPIME) 2 g in dextrose 5 % 50 mL IVPB  Status:  Discontinued     2 g 100 mL/hr over 30 Minutes Intravenous Every 12  hours 09/01/14 1349 09/01/14 1350   09/01/14 1800   ceFEPIme (MAXIPIME) 2 g in dextrose 5 % 50 mL IVPB  Status:  Discontinued     2 g 100 mL/hr over 30 Minutes Intravenous Every 8 hours 09/01/14 1350 09/02/14 1346   09/01/14 1200  vancomycin (VANCOCIN) 1,500 mg in sodium chloride 0.9 % 500 mL IVPB  Status:  Discontinued     1,500 mg 250 mL/hr over 120 Minutes Intravenous Every 24 hours 08/31/14 1424 09/02/14 1346   08/31/14 1000  vancomycin (VANCOCIN) 1,500 mg in sodium chloride 0.9 % 500 mL IVPB     1,500 mg 250 mL/hr over 120 Minutes Intravenous NOW 08/31/14 0957 08/31/14 1422   08/31/14 1000  ceFEPIme (MAXIPIME) 2 g in dextrose 5 % 50 mL IVPB  Status:  Discontinued     2 g 100 mL/hr over 30 Minutes Intravenous Every 8 hours 08/31/14 0957 09/01/14 1327   08/30/14 1430  ceFAZolin (ANCEF) IVPB 1 g/50 mL premix     1 g 100 mL/hr over 30 Minutes Intravenous Every 8 hours 08/30/14 1415 08/30/14 2255   08/29/14 2100  [MAR Hold]  ceFAZolin (ANCEF) IVPB 1 g/50 mL premix     (On MAR Hold since 08/30/14 0622)   1 g 100 mL/hr over 30 Minutes Intravenous Every 8 hours 08/29/14 1632 08/30/14 0625   08/29/14 0600  ceFAZolin (ANCEF) IVPB 2 g/50 mL premix     2 g 100 mL/hr over 30 Minutes Intravenous On call to O.R. 08/28/14 1406 08/29/14 1254       PHYSICAL EXAM: Vital signs in last 24 hours: Filed Vitals:   09/02/14 1833 09/02/14 1955 09/02/14 2346 09/03/14 0324  BP: 136/76 113/65 126/74 124/73  Pulse: 90 80 86 82  Temp: 98 F (36.7 C) 98.1 F (36.7 C) 98.4 F (36.9 C) 98 F (36.7 C)  TempSrc: Oral Oral Oral Oral  Resp: 18 18 16 18   Height:      Weight:      SpO2: 100% 100% 100% 100%    Weight change:  Filed Weights   08/29/14 0645 08/31/14 1115  Weight: 107.049 kg (236 lb) 113.2 kg (249 lb 9 oz)   Body mass index is 44.22 kg/(m^2).   Gen Exam: Awake and alert with clear speech.   Neck: Supple, No JVD.   Chest: B/L Clear.   CVS: S1 S2 Regular, no murmurs.  Abdomen: soft, BS +, non tender, non distended.  Extremities: no  edema, lower extremities warm to touch. Neurologic: Non Focal.   Skin: No Rash.   Wounds: N/A.   Intake/Output from previous day:  Intake/Output Summary (Last 24 hours) at 09/03/14 0940 Last data filed at 09/03/14 0933  Gross per 24 hour  Intake   2432 ml  Output   1700 ml  Net    732 ml     LAB RESULTS: CBC  Recent Labs Lab 08/31/14 0945 09/02/14 0256 09/02/14 0344 09/02/14 2138 09/03/14 0416  WBC 11.3* 8.1 8.4  --  6.5  HGB 11.8* 7.4* 7.4* 8.4* 8.2*  HCT 36.0 21.8* 22.4* 25.4* 24.9*  PLT 125* 152 154  --  203  MCV 82.4 81.3 81.8  --  81.1  MCH 27.0 27.6 27.0  --  26.7  MCHC 32.8 33.9 33.0  --  32.9  RDW 15.3 15.1 15.2  --  15.3    Chemistries   Recent Labs Lab 08/29/14 1341 08/31/14 0945 09/01/14 1230 09/02/14 0256 09/03/14 0416  NA 141 138  140 141 141  K 3.0* 4.6 3.9 3.6* 3.5*  CL  --  102 107 108 108  CO2  --  25 23 22 23   GLUCOSE  --  91 96 96 89  BUN  --  17 15 11 8   CREATININE  --  1.44* 0.76 0.65 0.60  CALCIUM  --  8.1* 8.3* 8.5 9.0  MG  --   --   --   --  1.8    CBG: No results found for this basename: GLUCAP,  in the last 168 hours  GFR Estimated Creatinine Clearance: 80.4 ml/min (by C-G formula based on Cr of 0.6).  Coagulation profile No results found for this basename: INR, PROTIME,  in the last 168 hours  Cardiac Enzymes  Recent Labs Lab 09/02/14 1600 09/02/14 2138 09/03/14 0416  TROPONINI 1.84* 1.83* 1.41*    No components found with this basename: POCBNP,  No results found for this basename: DDIMER,  in the last 72 hours No results found for this basename: HGBA1C,  in the last 72 hours No results found for this basename: CHOL, HDL, LDLCALC, TRIG, CHOLHDL, LDLDIRECT,  in the last 72 hours No results found for this basename: TSH, T4TOTAL, FREET3, T3FREE, THYROIDAB,  in the last 72 hours No results found for this basename: VITAMINB12, FOLATE, FERRITIN, TIBC, IRON, RETICCTPCT,  in the last 72 hours No results found for this  basename: LIPASE, AMYLASE,  in the last 72 hours  Urine Studies No results found for this basename: UACOL, UAPR, USPG, UPH, UTP, UGL, UKET, UBIL, UHGB, UNIT, UROB, ULEU, UEPI, UWBC, URBC, UBAC, CAST, CRYS, UCOM, BILUA,  in the last 72 hours  MICROBIOLOGY: Recent Results (from the past 240 hour(s))  CULTURE, BLOOD (ROUTINE X 2)     Status: None   Collection Time    08/31/14  5:52 AM      Result Value Ref Range Status   Specimen Description BLOOD RIGHT HAND   Final   Special Requests BOTTLES DRAWN AEROBIC ONLY 3CC   Final   Culture  Setup Time     Final   Value: 08/31/2014 12:30     Performed at [facility]   Culture     Final   Value:        BLOOD CULTURE RECEIVED NO GROWTH TO DATE CULTURE WILL BE HELD FOR 5 DAYS BEFORE ISSUING A FINAL NEGATIVE REPORT     Performed at [facility]   Report Status PENDING   Incomplete  URINE CULTURE     Status: None   Collection Time    08/31/14  6:14 AM      Result Value Ref Range Status   Specimen Description URINE, CATHETERIZED   Final   Special Requests NONE   Final   Culture  Setup Time     Final   Value: 08/31/2014 12:46     Performed at [place] [facility]     Final   Value: NO GROWTH     Performed at [facility]   Culture     Final   Value: NO GROWTH     Performed at [facility]   Report Status 09/01/2014 FINAL   Final  MRSA PCR SCREENING     Status: None   Collection Time    08/31/14 11:04 AM      Result Value Ref Range Status   MRSA by PCR NEGATIVE  NEGATIVE Final   Comment:  The GeneXpert MRSA Assay (FDA     approved for NASAL specimens     only), is one component of a     comprehensive MRSA colonization     surveillance program. It is not     intended to diagnose MRSA     infection nor to guide or     monitor treatment for     MRSA infections.  CULTURE, BLOOD (ROUTINE X 2)     Status: None   Collection Time    08/31/14  5:15 PM      Result Value Ref Range  Status   Specimen Description BLOOD LEFT ARM   Final   Special Requests BOTTLES DRAWN AEROBIC ONLY 4CC   Final   Culture  Setup Time     Final   Value: 09/01/2014 01:02     Performed at [facility]   Culture     Final   Value:        BLOOD CULTURE RECEIVED NO GROWTH TO DATE CULTURE WILL BE HELD FOR 5 DAYS BEFORE ISSUING A FINAL NEGATIVE REPORT     Performed at [facility]   Report Status PENDING   Incomplete    RADIOLOGY STUDIES/RESULTS: Dg Chest 2 View  08/21/2014   CLINICAL DATA:  Preoperative respiratory evaluation prior to lumbar spine surgery. Smoker with current history of hypertension.  EXAM: CHEST  2 VIEW  COMPARISON:  Portable chest x-ray 11/28/2009. Two-view chest x-ray 11/21/2009.  FINDINGS: Cardiac silhouette normal in size, unchanged. Thoracic aorta mildly atherosclerotic, not significantly changed. Hilar and mediastinal contours otherwise unremarkable. Minimal linear scarring in the lingula, unchanged. Lungs otherwise clear. No localized airspace consolidation. No pleural effusions. No pneumothorax. Normal pulmonary vascularity. Degenerative changes involving the thoracic spine. No significant interval change.  IMPRESSION: No acute cardiopulmonary disease.  Stable examination.   Electronically Signed   By: Evangeline Dakin M.D.   On: 08/21/2014 13:51   Dg Lumbar Spine 2-3 Views  08/30/2014   CLINICAL DATA:  L4-L5 posterior fusion.  Operative imaging.  EXAM: DG C-ARM 61-120 MIN; LUMBAR SPINE - 2-3 VIEW  COMPARISON:  Lumbar MRI, 02/03/2014.  FINDINGS: Submitted images show an anterior fusion plate and screws extending from L4-L5 and posterior left pedicle screws and interconnecting rod also extending from L4-L5. Radiolucent disc spacer maintains disc height, well-centered. No acute fracture or evidence of an operative complication.  IMPRESSION: Operative images from a L4-L5 fusion as described.   Electronically Signed   By: Lajean Manes M.D.   On: 08/30/2014 10:24    Dg Lumbar Spine 2-3 Views  08/29/2014   CLINICAL DATA:  L4-5 interbody fusion.  EXAM: LUMBAR SPINE - 2-3 VIEW; DG C-ARM 61-120 MIN  COMPARISON:  MRI scan of February 03, 2014.  FINDINGS: Two intraoperative fluoroscopic images were obtained of the lower lumbar spine. These images demonstrate surgical anterior and interbody fusion of L4-5 with good alignment of vertebral bodies.  IMPRESSION: Surgical anterior and interbody fusion of L4-5.   Electronically Signed   By: Sabino Dick M.D.   On: 08/29/2014 14:11   Dg Chest Port 1 View  09/02/2014   CLINICAL DATA:  Respiratory distress, history hypertension  EXAM: PORTABLE CHEST - 1 VIEW  COMPARISON:  Portable exam 0522 hr compared to 08/31/2014  FINDINGS: LEFT jugular central venous catheter tip projecting over SVC and confluence of azygos vein.  Enlargement of cardiac silhouette.  Mediastinal contours and pulmonary vascularity normal.  Lungs clear.  No pleural effusion, pneumothorax or acute  osseous findings.  IMPRESSION: No acute abnormalities.  Enlargement of cardiac silhouette.   Electronically Signed   By: Lavonia Dana M.D.   On: 09/02/2014 08:07   Dg Chest Port 1 View  08/31/2014   CLINICAL DATA:  CVC scratch has Central line placement.  EXAM: PORTABLE CHEST - 1 VIEW  COMPARISON:  08/31/2014  FINDINGS: Left central line is in place. The tip projects lateral to the mediastinal contour and may be within the azygos vein. No pneumothorax. Bibasilar atelectasis. Heart is normal size. No effusions or acute bony abnormality.  IMPRESSION: Left central line tip possibly within the azygos vein. No pneumothorax.  Bibasilar atelectasis.   Electronically Signed   By: Rolm Baptise M.D.   On: 08/31/2014 11:58   Dg Chest Port 1 View  08/31/2014   CLINICAL DATA:  Fever.  Evaluate for pneumonia.  EXAM: PORTABLE CHEST - 1 VIEW  COMPARISON:  08/21/2014  FINDINGS: Heart is upper limits normal in size. Linear densities in the lingula and left base, likely atelectasis. No  confluent opacity on the right. No effusions or acute bony abnormality.  IMPRESSION: Left basilar linear densities, likely atelectasis.   Electronically Signed   By: Rolm Baptise M.D.   On: 08/31/2014 09:34   Dg C-arm 1-60 Min  08/30/2014   CLINICAL DATA:  L4-L5 posterior fusion.  Operative imaging.  EXAM: DG C-ARM 61-120 MIN; LUMBAR SPINE - 2-3 VIEW  COMPARISON:  Lumbar MRI, 02/03/2014.  FINDINGS: Submitted images show an anterior fusion plate and screws extending from L4-L5 and posterior left pedicle screws and interconnecting rod also extending from L4-L5. Radiolucent disc spacer maintains disc height, well-centered. No acute fracture or evidence of an operative complication.  IMPRESSION: Operative images from a L4-L5 fusion as described.   Electronically Signed   By: Lajean Manes M.D.   On: 08/30/2014 10:24   Dg C-arm 1-60 Min  08/29/2014   CLINICAL DATA:  L4-5 interbody fusion.  EXAM: LUMBAR SPINE - 2-3 VIEW; DG C-ARM 61-120 MIN  COMPARISON:  MRI scan of February 03, 2014.  FINDINGS: Two intraoperative fluoroscopic images were obtained of the lower lumbar spine. These images demonstrate surgical anterior and interbody fusion of L4-5 with good alignment of vertebral bodies.  IMPRESSION: Surgical anterior and interbody fusion of L4-5.   Electronically Signed   By: Sabino Dick M.D.   On: 08/29/2014 14:11   Dg Or Local Abdomen  08/29/2014   CLINICAL DATA:  Interoperative foreign body count  EXAM: OR LOCAL ABDOMEN  COMPARISON:  11/28/2009  FINDINGS: Bowel gas pattern is nonobstructive. Fusion hardware is present at the L4-5 level compatible patient's ALIF. There are a few surgical clips at this level. Otherwise, no evidence of foreign body.  IMPRESSION: Postsurgical change compatible with ALIF.  No foreign body.  These results were called by telephone at the time of interpretation on 08/29/2014 at 12:58 pm to OR room 5.   Electronically Signed   By: Marin Olp M.D.   On: 08/29/2014 12:59     Oren Binet, MD  [facility] Pager:[phone]  If 7PM-7AM, please contact night-coverage [email or web address] Password [email or web address] 09/03/2014, 9:40 AM   LOS: 5 days   **Disclaimer: This note may have been dictated with voice recognition software. Similar sounding words can inadvertently be transcribed and this note may contain transcription errors which may not have been corrected upon publication of note.**

## 2014-09-04 DIAGNOSIS — I248 Other forms of acute ischemic heart disease: Secondary | ICD-10-CM

## 2014-09-04 LAB — MAGNESIUM: Magnesium: 1.9 mg/dL (ref 1.5–2.5)

## 2014-09-04 LAB — PHOSPHORUS: Phosphorus: 1.9 mg/dL — ABNORMAL LOW (ref 2.3–4.6)

## 2014-09-04 MED ORDER — POLYETHYLENE GLYCOL 3350 17 G PO PACK: 17.0000 g | PACK | Freq: Every day | ORAL | Status: DC

## 2014-09-04 MED ORDER — OXYCODONE-ACETAMINOPHEN 5-325 MG PO TABS: 1.0000 | ORAL_TABLET | ORAL | Status: DC | PRN

## 2014-09-04 MED ORDER — OXYCODONE HCL ER 30 MG PO T12A: 30.0000 mg | EXTENDED_RELEASE_TABLET | Freq: Two times a day (BID) | ORAL | Status: DC

## 2014-09-04 MED ORDER — SENNOSIDES-DOCUSATE SODIUM 8.6-50 MG PO TABS: 2.0000 | ORAL_TABLET | Freq: Two times a day (BID) | ORAL | Status: DC

## 2014-09-04 MED ORDER — CEFPODOXIME PROXETIL 100 MG PO TABS: 100.0000 mg | ORAL_TABLET | Freq: Two times a day (BID) | ORAL | Status: DC

## 2014-09-04 MED ORDER — ASPIRIN 81 MG PO TBEC: 81.0000 mg | DELAYED_RELEASE_TABLET | Freq: Every day | ORAL | Status: DC

## 2014-09-04 MED ORDER — OXYCODONE HCL ER 15 MG PO T12A
30.0000 mg | EXTENDED_RELEASE_TABLET | Freq: Two times a day (BID) | ORAL | Status: DC
  Administered 2014-09-04: 30 mg via ORAL
  Filled 2014-09-04: qty 2

## 2014-09-04 MED ORDER — ATORVASTATIN CALCIUM 40 MG PO TABS: 40.0000 mg | ORAL_TABLET | Freq: Every day | ORAL | Status: DC

## 2014-09-04 NOTE — Progress Notes (Signed)
    Patient 5 days PO Ant/Post lumbar fusion. Leg pain continues to be resolved. PO LBP improved with px meds back on board. Pt has been up with PT/OT and progressing well. Some difficulties with brace and SNF is recommended. Pt has been cleared by medicine team, cleared by cardiology today after echo. Pt reports resolved constipation l Good appetite. Denies ch px, SOB, Abd px.   Physical Exam:     BP 126/79  Pulse 84  Temp(Src) 99.3 F (37.4 C) (Oral)  Resp 18  Ht 5\' 3"  (1.6 m)  Wt 113.2 kg (249 lb 9 oz)  BMI 44.22 kg/m2  SpO2 100%  CBC Latest Ref Rng 09/03/2014 09/02/2014 09/02/2014  WBC 4.0 - 10.5 K/uL 6.5 - 8.4  Hemoglobin 12.0 - 15.0 g/dL 8.2(L) 8.4(L) 7.4(L)  Hematocrit 36.0 - 46.0 % 24.9(L) 25.4(L) 22.4(L)  Platelets 150 - 400 K/uL 203 - 154    Hg stable and improved s/p transfusion   Pt up in chair, TLSO in place worn appropriately, ANT/POST dressing CDI, Pt appears comfortable. NVI   POD #5 s/p Ant/Post L4-5 Fusion   - Resolved leg pain  - Expected PO LBP improved with current px regimen            - Oxycontin Q12hrs, Percocet for breakthrough px, Robaxin for muscle spasms            - NO NSAIDS for 82mo secondary to fusion, Low dose ASA OK to resume  - up with PT/OT, encourage ambulation  - TLSO at all times when OOB except trips to bathroom, OK to apply seated or standing  - SNF recommended and appropriate  - Social work consulted to start placement              - FL2 Signed and in chart, going to [place] following, elevated troponin believed secondary to euvolemia.               - Pt cleared for outpt f/u echo was reviewed  - HG stabilized after transfusion, pt asymptomatic at this time  - d/c to SNF, cards cleared, cleared by medical team and ortho

## 2014-09-04 NOTE — Discharge Summary (Signed)
PATIENT DETAILS Name: Ellen Henry Age: 68 y.o. Sex: female Date of Birth: [birth year] MRN: [id number]. Admitting Physician: Sinclair Ship, MD WUJ:[id number],YNWGN J, MD  Admit Date: 08/29/2014 Discharge date: 09/04/2014  Recommendations for Outpatient Follow-up:  1. Please monitor CBC and chemistries closely, suggested repeat CBC in one week 2. Currently off all antihypertensive medications-please assess at followup- and resume accordingly 3. [facility]eds [facility] for outpatient nuclear stress test 4. Will need followup with Dr. Marinda Elk- in the next 1-2 weeks for wound check/postop followup  PRIMARY DISCHARGE DIAGNOSIS:  Principal Problem:   Radiculopathy Active Problems:   SIRS (systemic inflammatory response syndrome)   Hypotension, unspecified   UTI (urinary tract infection)   Septic shock      PAST MEDICAL HISTORY: Past Medical History  Diagnosis Date  . Hypertension   . Arthritis   . Gout     DISCHARGE MEDICATIONS:   Medication List    STOP taking these medications       traMADol 50 MG tablet  Commonly known as:  ULTRAM     TRIBENZOR 20-5-12.5 MG Tabs  Generic drug:  Olmesartan-Amlodipine-HCTZ      TAKE these medications       aspirin 81 MG EC tablet  Take 1 tablet (81 mg total) by mouth daily.     atorvastatin 40 MG tablet  Commonly known as:  LIPITOR  Take 1 tablet (40 mg total) by mouth daily at 6 PM.     cefpodoxime 100 MG tablet  Commonly known as:  VANTIN  Take 1 tablet (100 mg total) by mouth every 12 (twelve) hours. STOP DATE 09/06/14     colchicine 0.6 MG tablet  Take 0.6 mg by mouth daily as needed. For gout     gabapentin 600 MG tablet  Commonly known as:  NEURONTIN  Take 600 mg by mouth 3 (three) times daily.     OxyCODONE HCl ER 30 MG T12a  Take 30 mg by mouth every 12 (twelve) hours.     oxyCODONE-acetaminophen 5-325 MG per tablet  Commonly known as:  PERCOCET/ROXICET  Take 1-2  tablets by mouth every 4 (four) hours as needed for moderate pain.     polyethylene glycol packet  Commonly known as:  MIRALAX / GLYCOLAX  Take 17 g by mouth daily.     senna-docusate 8.6-50 MG per tablet  Commonly known as:  Senokot-S  Take 2 tablets by mouth 2 (two) times daily.        ALLERGIES:   Allergies  Allergen Reactions  . Food Allergy Formula Other (See Comments)    Shellfish-banana-beef-flares up her gout    BRIEF HPI:  See H&P, Labs, Consult and Test reports for all details in brief,68 y/o AAF with a PMH of bilateral leg pain admitted 9/23 for planned two stage anterior L4-5 fusion  CONSULTATIONS:   cardiology, pulmonary/intensive care and orthopedic surgery  PERTINENT RADIOLOGIC STUDIES: Dg Chest 2 View  08/21/2014   CLINICAL DATA:  Preoperative respiratory evaluation prior to lumbar spine surgery. Smoker with current history of hypertension.  EXAM: CHEST  2 VIEW  COMPARISON:  Portable chest x-ray 11/28/2009. Two-view chest x-ray 11/21/2009.  FINDINGS: Cardiac silhouette normal in size, unchanged. Thoracic aorta mildly atherosclerotic, not significantly changed. Hilar and mediastinal contours otherwise unremarkable. Minimal linear scarring in the lingula, unchanged. Lungs otherwise clear. No localized airspace consolidation. No pleural effusions. No pneumothorax. Normal pulmonary vascularity. Degenerative changes involving the thoracic spine. No significant interval change.  IMPRESSION:  No acute cardiopulmonary disease.  Stable examination.   Electronically Signed   By: Evangeline Dakin M.D.   On: 08/21/2014 13:51   Dg Lumbar Spine 2-3 Views  08/30/2014   CLINICAL DATA:  L4-L5 posterior fusion.  Operative imaging.  EXAM: DG C-ARM 61-120 MIN; LUMBAR SPINE - 2-3 VIEW  COMPARISON:  Lumbar MRI, 02/03/2014.  FINDINGS: Submitted images show an anterior fusion plate and screws extending from L4-L5 and posterior left pedicle screws and interconnecting rod also extending from  L4-L5. Radiolucent disc spacer maintains disc height, well-centered. No acute fracture or evidence of an operative complication.  IMPRESSION: Operative images from a L4-L5 fusion as described.   Electronically Signed   By: Lajean Manes M.D.   On: 08/30/2014 10:24   Dg Lumbar Spine 2-3 Views  08/29/2014   CLINICAL DATA:  L4-5 interbody fusion.  EXAM: LUMBAR SPINE - 2-3 VIEW; DG C-ARM 61-120 MIN  COMPARISON:  MRI scan of February 03, 2014.  FINDINGS: Two intraoperative fluoroscopic images were obtained of the lower lumbar spine. These images demonstrate surgical anterior and interbody fusion of L4-5 with good alignment of vertebral bodies.  IMPRESSION: Surgical anterior and interbody fusion of L4-5.   Electronically Signed   By: Sabino Dick M.D.   On: 08/29/2014 14:11   Dg Chest Port 1 View  09/02/2014   CLINICAL DATA:  Respiratory distress, history hypertension  EXAM: PORTABLE CHEST - 1 VIEW  COMPARISON:  Portable exam 0522 hr compared to 08/31/2014  FINDINGS: LEFT jugular central venous catheter tip projecting over SVC and confluence of azygos vein.  Enlargement of cardiac silhouette.  Mediastinal contours and pulmonary vascularity normal.  Lungs clear.  No pleural effusion, pneumothorax or acute osseous findings.  IMPRESSION: No acute abnormalities.  Enlargement of cardiac silhouette.   Electronically Signed   By: Lavonia Dana M.D.   On: 09/02/2014 08:07   Dg Chest Port 1 View  08/31/2014   CLINICAL DATA:  CVC scratch has Central line placement.  EXAM: PORTABLE CHEST - 1 VIEW  COMPARISON:  08/31/2014  FINDINGS: Left central line is in place. The tip projects lateral to the mediastinal contour and may be within the azygos vein. No pneumothorax. Bibasilar atelectasis. Heart is normal size. No effusions or acute bony abnormality.  IMPRESSION: Left central line tip possibly within the azygos vein. No pneumothorax.  Bibasilar atelectasis.   Electronically Signed   By: Rolm Baptise M.D.   On: 08/31/2014 11:58     Dg Chest Port 1 View  08/31/2014   CLINICAL DATA:  Fever.  Evaluate for pneumonia.  EXAM: PORTABLE CHEST - 1 VIEW  COMPARISON:  08/21/2014  FINDINGS: Heart is upper limits normal in size. Linear densities in the lingula and left base, likely atelectasis. No confluent opacity on the right. No effusions or acute bony abnormality.  IMPRESSION: Left basilar linear densities, likely atelectasis.   Electronically Signed   By: Rolm Baptise M.D.   On: 08/31/2014 09:34   Dg C-arm 1-60 Min  08/30/2014   CLINICAL DATA:  L4-L5 posterior fusion.  Operative imaging.  EXAM: DG C-ARM 61-120 MIN; LUMBAR SPINE - 2-3 VIEW  COMPARISON:  Lumbar MRI, 02/03/2014.  FINDINGS: Submitted images show an anterior fusion plate and screws extending from L4-L5 and posterior left pedicle screws and interconnecting rod also extending from L4-L5. Radiolucent disc spacer maintains disc height, well-centered. No acute fracture or evidence of an operative complication.  IMPRESSION: Operative images from a L4-L5 fusion as described.   Electronically Signed  By: Lajean Manes M.D.   On: 08/30/2014 10:24   Dg C-arm 1-60 Min  08/29/2014   CLINICAL DATA:  L4-5 interbody fusion.  EXAM: LUMBAR SPINE - 2-3 VIEW; DG C-ARM 61-120 MIN  COMPARISON:  MRI scan of February 03, 2014.  FINDINGS: Two intraoperative fluoroscopic images were obtained of the lower lumbar spine. These images demonstrate surgical anterior and interbody fusion of L4-5 with good alignment of vertebral bodies.  IMPRESSION: Surgical anterior and interbody fusion of L4-5.   Electronically Signed   By: Sabino Dick M.D.   On: 08/29/2014 14:11   Dg Or Local Abdomen  08/29/2014   CLINICAL DATA:  Interoperative foreign body count  EXAM: OR LOCAL ABDOMEN  COMPARISON:  11/28/2009  FINDINGS: Bowel gas pattern is nonobstructive. Fusion hardware is present at the L4-5 level compatible patient's ALIF. There are a few surgical clips at this level. Otherwise, no evidence of foreign body.   IMPRESSION: Postsurgical change compatible with ALIF.  No foreign body.  These results were called by telephone at the time of interpretation on 08/29/2014 at 12:58 pm to OR room 5.   Electronically Signed   By: Marin Olp M.D.   On: 08/29/2014 12:59     PERTINENT LAB RESULTS: CBC:  Recent Labs  09/02/14 0344 09/02/14 2138 09/03/14 0416  WBC 8.4  --  6.5  HGB 7.4* 8.4* 8.2*  HCT 22.4* 25.4* 24.9*  PLT 154  --  203   CMET CMP     Component Value Date/Time   NA 141 09/03/2014 0416   K 3.5* 09/03/2014 0416   CL 108 09/03/2014 0416   CO2 23 09/03/2014 0416   GLUCOSE 89 09/03/2014 0416   BUN 8 09/03/2014 0416   CREATININE 0.60 09/03/2014 0416   CALCIUM 9.0 09/03/2014 0416   PROT 6.0 09/03/2014 0416   ALBUMIN 2.3* 09/03/2014 0416   AST 36 09/03/2014 0416   ALT 17 09/03/2014 0416   ALKPHOS 110 09/03/2014 0416   BILITOT 0.5 09/03/2014 0416   GFRNONAA >90 09/03/2014 0416   GFRAA >90 09/03/2014 0416    GFR Estimated Creatinine Clearance: 80.4 ml/min (by C-G formula based on Cr of 0.6). No results found for this basename: LIPASE, AMYLASE,  in the last 72 hours  Recent Labs  09/02/14 1600 09/02/14 2138 09/03/14 0416  TROPONINI 1.84* 1.83* 1.41*   No components found with this basename: POCBNP,  No results found for this basename: DDIMER,  in the last 72 hours No results found for this basename: HGBA1C,  in the last 72 hours No results found for this basename: CHOL, HDL, LDLCALC, TRIG, CHOLHDL, LDLDIRECT,  in the last 72 hours No results found for this basename: TSH, T4TOTAL, FREET3, T3FREE, THYROIDAB,  in the last 72 hours No results found for this basename: VITAMINB12, FOLATE, FERRITIN, TIBC, IRON, RETICCTPCT,  in the last 72 hours Coags: No results found for this basename: PT, INR,  in the last 72 hours Microbiology: Recent Results (from the past 240 hour(s))  CULTURE, BLOOD (ROUTINE X 2)     Status: None   Collection Time    08/31/14  5:52 AM      Result Value Ref Range Status    Specimen Description BLOOD RIGHT HAND   Final   Special Requests BOTTLES DRAWN AEROBIC ONLY 3CC   Final   Culture  Setup Time     Final   Value: 08/31/2014 12:30     Performed at [facility]  Final   Value:        BLOOD CULTURE RECEIVED NO GROWTH TO DATE CULTURE WILL BE HELD FOR 5 DAYS BEFORE ISSUING A FINAL NEGATIVE REPORT     Performed at [facility]   Report Status PENDING   Incomplete  URINE CULTURE     Status: None   Collection Time    08/31/14  6:14 AM      Result Value Ref Range Status   Specimen Description URINE, CATHETERIZED   Final   Special Requests NONE   Final   Culture  Setup Time     Final   Value: 08/31/2014 12:46     Performed at [facility]     Final   Value: NO GROWTH     Performed at [facility]   Culture     Final   Value: NO GROWTH     Performed at [facility]   Report Status 09/01/2014 FINAL   Final  MRSA PCR SCREENING     Status: None   Collection Time    08/31/14 11:04 AM      Result Value Ref Range Status   MRSA by PCR NEGATIVE  NEGATIVE Final   Comment:            The GeneXpert MRSA Assay (FDA     approved for NASAL specimens     only), is one component of a     comprehensive MRSA colonization     surveillance program. It is not     intended to diagnose MRSA     infection nor to guide or     monitor treatment for     MRSA infections.  CULTURE, BLOOD (ROUTINE X 2)     Status: None   Collection Time    08/31/14  5:15 PM      Result Value Ref Range Status   Specimen Description BLOOD LEFT ARM   Final   Special Requests BOTTLES DRAWN AEROBIC ONLY 4CC   Final   Culture  Setup Time     Final   Value: 09/01/2014 01:02     Performed at [facility]   Culture     Final   Value:        BLOOD CULTURE RECEIVED NO GROWTH TO DATE CULTURE WILL BE HELD FOR 5 DAYS BEFORE ISSUING A FINAL NEGATIVE REPORT     Performed at [facility]   Report Status PENDING    Incomplete     BRIEF HOSPITAL COURSE:   Principal Problem: Shock  -Multifactorial likely secondary to urosepsis, and hypovolemia.  - Resolved with IV fluids, IV antibiotics and pressors- blood pressure now stable. Previously on antihypertensive medications, currently off all antihypertensive medications. Please see below.  Active Problems: UTI  - UA suggestive of UTI, however urine cultures negative (already had received antibiotics), was on broad-spectrum antibiotics with vancomycin and cefepime-currently on Vantin-stop date 09/06/14. Remains afebrile and without leukocytosis.  Elevated troponin  -likely secondary to demand ischemia rather than non-STEMI  - Echocardiogram showed preserved ejection fraction, he was seen by cardiology during this admission, current recommendations of outpatient nuclear stress test one patient recovers from the acute illness. Please arrange for followup with Cardiology within the next few weeks.  Anemia  - Anemia likely is multifactorial-secondary to perioperative blood loss, worsened by acute illness, IV fluid resuscitation. Status post blood transfusion on 9/27. Hemoglobin stable at 8.2 on 9/28. Please monitor CBC closely,  suggest repeat CBC in one week  Acute renal failure  - Likely prerenal, resolved with supportive care. Monitor electrolytes periodically   S/P Anterior / Posterior L 4/5 Lumbar Fusion w/ Post-op pain  - Currently on as needed narcotics-still with significant pain better-will start long-acting OxyContin titrated to 30 mg BID, and use Percocet for breakthrough pain.Place on bowel regimen with Miralax/Senokot.Ortho following-suspect will require SNF on discharge-patient agreeable.  HTN  - Blood pressure currently controlled without the use of any antihypertensive medications- was on olmesartan/amlodipine/Hctz- monitor and restart accordingly.  Morbid obesity  TODAY-DAY OF DISCHARGE:  Subjective:   Alvena Kiernan today has no  headache,no chest abdominal pain,no new weakness tingling or numbness, feels much better wants to go home today.   Objective:   Blood pressure 145/90, pulse 90, temperature 98.8 F (37.1 C), temperature source Oral, resp. rate 18, height 5\' 3"  (1.6 m), weight 113.2 kg (249 lb 9 oz), SpO2 99.00%.  Intake/Output Summary (Last 24 hours) at 09/04/14 1007 Last data filed at 09/03/14 2300  Gross per 24 hour  Intake    600 ml  Output      0 ml  Net    600 ml   Filed Weights   08/29/14 0645 08/31/14 1115  Weight: 107.049 kg (236 lb) 113.2 kg (249 lb 9 oz)    Exam Awake Alert, Oriented *3, No new F.N deficits, Normal affect [place].AT,PERRAL Supple Neck,No JVD, No cervical lymphadenopathy appriciated.  Symmetrical Chest wall movement, Good air movement bilaterally, CTAB RRR,No Gallops,Rubs or new Murmurs, No Parasternal Heave +ve B.Sounds, Abd Soft, Non tender, No organomegaly appriciated, No rebound -guarding or rigidity. No Cyanosis, Clubbing or edema, No new Rash or bruise  DISCHARGE CONDITION: Stable  DISPOSITION: SNF  DISCHARGE INSTRUCTIONS:    Activity:  As tolerated with Full fall precautions use walker/cane & assistance as needed  Diet recommendation: Heart Healthy diet      Discharge Instructions   Call MD for:  redness, tenderness, or signs of infection (pain, swelling, redness, odor or green/yellow discharge around incision site)    Complete by:  As directed      Call MD for:  severe uncontrolled pain    Complete by:  As directed      Diet - low sodium heart healthy    Complete by:  As directed      Increase activity slowly    Complete by:  As directed            Follow-up Information   Follow up with EARLY, TODD, MD In 2 weeks. (Office will call you to arrange your appt (sent))    Specialty:  Vascular Surgery   Contact information:   [place] [address] [phone]       Follow up with Elyn Peers, MD. Schedule an appointment as soon as  possible for a visit in 1 week.   Specialty:  Family Medicine   Contact information:   [address] [place] [address] [phone]       Follow up with Sinclair Ship, MD. Schedule an appointment as soon as possible for a visit in 1 week.   Specialty:  Orthopedic Surgery   Contact information:   [address] [place] [address] [phone]       Follow up with Peter Martinique, MD. Schedule an appointment as soon as possible for a visit in 2 weeks.   Specialty:  Cardiology   Contact information:   [address] [place] [address] [phone]      Total Time spent on discharge equals 45 minutes.  SignedOren Binet 09/04/2014 10:07 AM  **Disclaimer: This note may have been dictated with voice recognition software. Similar sounding words can inadvertently be transcribed and this note may contain transcription errors which may not have been corrected upon publication of note.**

## 2014-09-04 NOTE — Discharge Summary (Signed)
Patient ID: Ellen Henry MRN: [id number] DOB/AGE: September 22, 1946 68 y.o.  Admit date: 08/29/2014 Discharge date: 09/04/2014  Admission Diagnoses:  Principal Problem:   Radiculopathy Active Problems:   SIRS (systemic inflammatory response syndrome)   Hypotension, unspecified   UTI (urinary tract infection)   Septic shock   Discharge Diagnoses:  Same  Past Medical History  Diagnosis Date  . Hypertension   . Arthritis   . Gout     Surgeries: Procedure(s): STAGED ANTERIOR POSTERIOR LUMBAR FUSION 1 LEVEL L4-5 on 08/29/2014 - 08/30/2014   Consultants: Treatment Team:  Michae Kava Lbcardiology, MD [facility]   Discharged Condition: Improved  Hospital Course: Ellen Henry is an 68 y.o. female who was admitted 08/29/2014 for operative treatment of Radiculopathy. Patient has severe unremitting pain that affects sleep, daily activities, and work/hobbies. After pre-op clearance the patient was taken to the operating room on 08/29/2014 - 08/30/2014 and underwent  Procedure(s): STAGED ANTERIOR POSTERIOR LUMBAR FUSION 1 LEVEL L4-5.    Patient was given perioperative antibiotics: Anti-infectives   Start     Dose/Rate Route Frequency Ordered Stop   09/04/14 0000  cefpodoxime (VANTIN) 100 MG tablet     100 mg Oral Every 12 hours 09/04/14 1005     09/02/14 1800  cefTRIAXone (ROCEPHIN) 2 g in dextrose 5 % 50 mL IVPB  Status:  Discontinued     2 g 100 mL/hr over 30 Minutes Intravenous Every 24 hours 09/02/14 1346 09/02/14 1351   09/02/14 1400  cefpodoxime (VANTIN) tablet 100 mg     100 mg Oral Every 12 hours 09/02/14 1351 09/06/14 2359   09/02/14 0934  ceFEPIme (MAXIPIME) 2 g in dextrose 5 % 50 mL IVPB  Status:  Discontinued     2 g 100 mL/hr over 30 Minutes Intravenous Every 24 hours 09/01/14 1327 09/01/14 1349   09/01/14 2200  ceFEPIme (MAXIPIME) 2 g in dextrose 5 % 50 mL IVPB  Status:  Discontinued     2 g 100 mL/hr over 30 Minutes Intravenous Every 12 hours 09/01/14 1349  09/01/14 1350   09/01/14 1800  ceFEPIme (MAXIPIME) 2 g in dextrose 5 % 50 mL IVPB  Status:  Discontinued     2 g 100 mL/hr over 30 Minutes Intravenous Every 8 hours 09/01/14 1350 09/02/14 1346   09/01/14 1200  vancomycin (VANCOCIN) 1,500 mg in sodium chloride 0.9 % 500 mL IVPB  Status:  Discontinued     1,500 mg 250 mL/hr over 120 Minutes Intravenous Every 24 hours 08/31/14 1424 09/02/14 1346   08/31/14 1000  vancomycin (VANCOCIN) 1,500 mg in sodium chloride 0.9 % 500 mL IVPB     1,500 mg 250 mL/hr over 120 Minutes Intravenous NOW 08/31/14 0957 08/31/14 1422   08/31/14 1000  ceFEPIme (MAXIPIME) 2 g in dextrose 5 % 50 mL IVPB  Status:  Discontinued     2 g 100 mL/hr over 30 Minutes Intravenous Every 8 hours 08/31/14 0957 09/01/14 1327   08/30/14 1430  ceFAZolin (ANCEF) IVPB 1 g/50 mL premix     1 g 100 mL/hr over 30 Minutes Intravenous Every 8 hours 08/30/14 1415 08/30/14 2255   08/29/14 2100  [MAR Hold]  ceFAZolin (ANCEF) IVPB 1 g/50 mL premix     (On MAR Hold since 08/30/14 0622)   1 g 100 mL/hr over 30 Minutes Intravenous Every 8 hours 08/29/14 1632 08/30/14 0625   08/29/14 0600  ceFAZolin (ANCEF) IVPB 2 g/50 mL premix     2 g 100  mL/hr over 30 Minutes Intravenous On call to O.R. 08/28/14 1406 08/29/14 1254       Patient was given sequential compression devices, early ambulation to prevent DVT.  Patient benefited maximally from hospital stay and there were complications with PO acute blood loss anemia resolved with transfusion, UTI resolved with ABX, HypoTN resolved with euvolemia correction, ARF Resolved ,amaged by medicine, elevated troponin from euvolemia cleared by cardiology  Recent vital signs: Patient Vitals for the past 24 hrs:  BP Temp Temp src Pulse Resp SpO2  09/04/14 1000 126/79 mmHg 99.3 F (37.4 C) Oral 84 18 100 %  09/04/14 0536 145/90 mmHg 98.8 F (37.1 C) Oral 90 18 99 %  09/04/14 0138 125/73 mmHg 99.3 F (37.4 C) Oral 92 18 97 %  09/03/14 2117 151/88 mmHg 99.4  F (37.4 C) Oral 85 18 100 %  09/03/14 1419 123/71 mmHg 99.7 F (37.6 C) Oral 91 - 100 %     Discharge Medications:     Medication List    STOP taking these medications       traMADol 50 MG tablet  Commonly known as:  ULTRAM     TRIBENZOR 20-5-12.5 MG Tabs  Generic drug:  Olmesartan-Amlodipine-HCTZ      TAKE these medications       aspirin 81 MG EC tablet  Take 1 tablet (81 mg total) by mouth daily.     atorvastatin 40 MG tablet  Commonly known as:  LIPITOR  Take 1 tablet (40 mg total) by mouth daily at 6 PM.     cefpodoxime 100 MG tablet  Commonly known as:  VANTIN  Take 1 tablet (100 mg total) by mouth every 12 (twelve) hours. STOP DATE 09/06/14     colchicine 0.6 MG tablet  Take 0.6 mg by mouth daily as needed. For gout     gabapentin 600 MG tablet  Commonly known as:  NEURONTIN  Take 600 mg by mouth 3 (three) times daily.     OxyCODONE HCl ER 30 MG T12a  Take 30 mg by mouth every 12 (twelve) hours.     oxyCODONE-acetaminophen 5-325 MG per tablet  Commonly known as:  PERCOCET/ROXICET  Take 1-2 tablets by mouth every 4 (four) hours as needed for moderate pain.     polyethylene glycol packet  Commonly known as:  MIRALAX / GLYCOLAX  Take 17 g by mouth daily.     senna-docusate 8.6-50 MG per tablet  Commonly known as:  Senokot-S  Take 2 tablets by mouth 2 (two) times daily.        Diagnostic Studies: Dg Chest 2 View  08/21/2014   CLINICAL DATA:  Preoperative respiratory evaluation prior to lumbar spine surgery. Smoker with current history of hypertension.  EXAM: CHEST  2 VIEW  COMPARISON:  Portable chest x-ray 11/28/2009. Two-view chest x-ray 11/21/2009.  FINDINGS: Cardiac silhouette normal in size, unchanged. Thoracic aorta mildly atherosclerotic, not significantly changed. Hilar and mediastinal contours otherwise unremarkable. Minimal linear scarring in the lingula, unchanged. Lungs otherwise clear. No localized airspace consolidation. No pleural effusions.  No pneumothorax. Normal pulmonary vascularity. Degenerative changes involving the thoracic spine. No significant interval change.  IMPRESSION: No acute cardiopulmonary disease.  Stable examination.   Electronically Signed   By: Evangeline Dakin M.D.   On: 08/21/2014 13:51   Dg Lumbar Spine 2-3 Views  08/30/2014   CLINICAL DATA:  L4-L5 posterior fusion.  Operative imaging.  EXAM: DG C-ARM 61-120 MIN; LUMBAR SPINE - 2-3 VIEW  COMPARISON:  Lumbar  MRI, 02/03/2014.  FINDINGS: Submitted images show an anterior fusion plate and screws extending from L4-L5 and posterior left pedicle screws and interconnecting rod also extending from L4-L5. Radiolucent disc spacer maintains disc height, well-centered. No acute fracture or evidence of an operative complication.  IMPRESSION: Operative images from a L4-L5 fusion as described.   Electronically Signed   By: Lajean Manes M.D.   On: 08/30/2014 10:24   Dg Lumbar Spine 2-3 Views  08/29/2014   CLINICAL DATA:  L4-5 interbody fusion.  EXAM: LUMBAR SPINE - 2-3 VIEW; DG C-ARM 61-120 MIN  COMPARISON:  MRI scan of February 03, 2014.  FINDINGS: Two intraoperative fluoroscopic images were obtained of the lower lumbar spine. These images demonstrate surgical anterior and interbody fusion of L4-5 with good alignment of vertebral bodies.  IMPRESSION: Surgical anterior and interbody fusion of L4-5.   Electronically Signed   By: Sabino Dick M.D.   On: 08/29/2014 14:11   Dg Chest Port 1 View  09/02/2014   CLINICAL DATA:  Respiratory distress, history hypertension  EXAM: PORTABLE CHEST - 1 VIEW  COMPARISON:  Portable exam 0522 hr compared to 08/31/2014  FINDINGS: LEFT jugular central venous catheter tip projecting over SVC and confluence of azygos vein.  Enlargement of cardiac silhouette.  Mediastinal contours and pulmonary vascularity normal.  Lungs clear.  No pleural effusion, pneumothorax or acute osseous findings.  IMPRESSION: No acute abnormalities.  Enlargement of cardiac  silhouette.   Electronically Signed   By: Lavonia Dana M.D.   On: 09/02/2014 08:07   Dg Chest Port 1 View  08/31/2014   CLINICAL DATA:  CVC scratch has Central line placement.  EXAM: PORTABLE CHEST - 1 VIEW  COMPARISON:  08/31/2014  FINDINGS: Left central line is in place. The tip projects lateral to the mediastinal contour and may be within the azygos vein. No pneumothorax. Bibasilar atelectasis. Heart is normal size. No effusions or acute bony abnormality.  IMPRESSION: Left central line tip possibly within the azygos vein. No pneumothorax.  Bibasilar atelectasis.   Electronically Signed   By: Rolm Baptise M.D.   On: 08/31/2014 11:58   Dg Chest Port 1 View  08/31/2014   CLINICAL DATA:  Fever.  Evaluate for pneumonia.  EXAM: PORTABLE CHEST - 1 VIEW  COMPARISON:  08/21/2014  FINDINGS: Heart is upper limits normal in size. Linear densities in the lingula and left base, likely atelectasis. No confluent opacity on the right. No effusions or acute bony abnormality.  IMPRESSION: Left basilar linear densities, likely atelectasis.   Electronically Signed   By: Rolm Baptise M.D.   On: 08/31/2014 09:34   Dg C-arm 1-60 Min  08/30/2014   CLINICAL DATA:  L4-L5 posterior fusion.  Operative imaging.  EXAM: DG C-ARM 61-120 MIN; LUMBAR SPINE - 2-3 VIEW  COMPARISON:  Lumbar MRI, 02/03/2014.  FINDINGS: Submitted images show an anterior fusion plate and screws extending from L4-L5 and posterior left pedicle screws and interconnecting rod also extending from L4-L5. Radiolucent disc spacer maintains disc height, well-centered. No acute fracture or evidence of an operative complication.  IMPRESSION: Operative images from a L4-L5 fusion as described.   Electronically Signed   By: Lajean Manes M.D.   On: 08/30/2014 10:24   Dg C-arm 1-60 Min  08/29/2014   CLINICAL DATA:  L4-5 interbody fusion.  EXAM: LUMBAR SPINE - 2-3 VIEW; DG C-ARM 61-120 MIN  COMPARISON:  MRI scan of February 03, 2014.  FINDINGS: Two intraoperative  fluoroscopic images were obtained of the lower  lumbar spine. These images demonstrate surgical anterior and interbody fusion of L4-5 with good alignment of vertebral bodies.  IMPRESSION: Surgical anterior and interbody fusion of L4-5.   Electronically Signed   By: Sabino Dick M.D.   On: 08/29/2014 14:11   Dg Or Local Abdomen  08/29/2014   CLINICAL DATA:  Interoperative foreign body count  EXAM: OR LOCAL ABDOMEN  COMPARISON:  11/28/2009  FINDINGS: Bowel gas pattern is nonobstructive. Fusion hardware is present at the L4-5 level compatible patient's ALIF. There are a few surgical clips at this level. Otherwise, no evidence of foreign body.  IMPRESSION: Postsurgical change compatible with ALIF.  No foreign body.  These results were called by telephone at the time of interpretation on 08/29/2014 at 12:58 pm to OR room 5.   Electronically Signed   By: Marin Olp M.D.   On: 08/29/2014 12:59    Disposition: 01-Home or Self Care      Discharge Instructions   Call MD for:  redness, tenderness, or signs of infection (pain, swelling, redness, odor or green/yellow discharge around incision site)    Complete by:  As directed      Call MD for:  severe uncontrolled pain    Complete by:  As directed      Diet - low sodium heart healthy    Complete by:  As directed      Increase activity slowly    Complete by:  As directed           POD #5 s/p Ant/Post L4-5 Fusion  - Resolved leg pain  - Expected PO LBP improved with current px regimen   - Oxycontin Q12hrs, Percocet for breakthrough px, Robaxin for muscle spasms   - NO NSAIDS for 47mo secondary to fusion, Low dose ASA OK to resume  - up with PT/OT, encourage ambulation   - TLSO at all times when OOB except trips to bathroom, OK to apply seated or standing  - SNF recommended and appropriate   - Social work consulted to start placement   - FL2 Signed and in chart, going to [facility] following, elevated troponin believed secondary to  euvolemia.   - Pt cleared for outpt f/u echo was reviewed   - HG stabilized after transfusion, pt asymptomatic at this time  - D/c to SNF, cards cleared, cleared by medical team and ortho -Written scripts for pain signed and in chart -D/C instructions sheet printed and in chart -F/U in office 2 weeks PO, appt made  Signed: Justice Britain 09/04/2014, 1:49 PM

## 2014-09-04 NOTE — Progress Notes (Signed)
Discharge instructions given to EMT to take to rehab.  Patient alert and oriented times 4.  Patient PIV removed.  Area clean/dry/ and intact.   Discharge vitals are Filed Vitals:   09/04/14 1000  BP: 126/79  Pulse: 84  Temp: 99.3 F (37.4 C)  Resp: 18   Discharge medications are   Medication List    STOP taking these medications       traMADol 50 MG tablet  Commonly known as:  ULTRAM     TRIBENZOR 20-5-12.5 MG Tabs  Generic drug:  Olmesartan-Amlodipine-HCTZ      TAKE these medications       aspirin 81 MG EC tablet  Take 1 tablet (81 mg total) by mouth daily.     atorvastatin 40 MG tablet  Commonly known as:  LIPITOR  Take 1 tablet (40 mg total) by mouth daily at 6 PM.     cefpodoxime 100 MG tablet  Commonly known as:  VANTIN  Take 1 tablet (100 mg total) by mouth every 12 (twelve) hours. STOP DATE 09/06/14     colchicine 0.6 MG tablet  Take 0.6 mg by mouth daily as needed. For gout     gabapentin 600 MG tablet  Commonly known as:  NEURONTIN  Take 600 mg by mouth 3 (three) times daily.     OxyCODONE HCl ER 30 MG T12a  Take 30 mg by mouth every 12 (twelve) hours.     oxyCODONE-acetaminophen 5-325 MG per tablet  Commonly known as:  PERCOCET/ROXICET  Take 1-2 tablets by mouth every 4 (four) hours as needed for moderate pain.     polyethylene glycol packet  Commonly known as:  MIRALAX / GLYCOLAX  Take 17 g by mouth daily.     senna-docusate 8.6-50 MG per tablet  Commonly known as:  Senokot-S  Take 2 tablets by mouth 2 (two) times daily.       patient discharged [facility] via ambulance. Forrestine Him, RN 09/04/14 at [phone]

## 2014-09-04 NOTE — Progress Notes (Signed)
Physical Therapy Treatment Patient Details Name: Ellen Henry MRN: [id number] DOB: 21-Apr-1946 Today's Date: 09/04/2014    History of Present Illness Pt is a 68 y/o F with a PMH of bilateral leg pain admitted 9/23 for planned two stage anterior L4-5 fusion. The patient returned to the OR on 9/24 for the posterior portion of the fusion. Post-operatively 9/25, the patient developed hypotension with concerns for UTI.    PT Comments    Pt able to increase ambulation distance and required only MinA for mobility.  Pt would continue to benefit from SNF at D/C to maximize independence prior to returning to home.    Follow Up Recommendations  SNF     Equipment Recommendations  3in1 (PT);Rolling walker with 5" wheels    Recommendations for Other Services       Precautions / Restrictions Precautions Precautions: Fall;Back Precaution Comments: Reveiwed back precautions Required Braces or Orthoses: Spinal Brace Spinal Brace: Thoracolumbosacral orthotic;Applied in sitting position;Applied in standing position (Per PA note ok to apply sitting or standing.  ) Spinal Brace Comments: Ok to get up to 3-in-1 without brace per PA note.   Restrictions Weight Bearing Restrictions: No    Mobility  Bed Mobility Overal bed mobility: Needs Assistance Bed Mobility: Rolling;Sidelying to Sit Rolling: Min assist Sidelying to sit: Min assist       General bed mobility comments: cues for log roll technique.   Transfers Overall transfer level: Needs assistance Equipment used: Rolling walker (2 wheeled) Transfers: Sit to/from Stand Sit to Stand: Min assist         General transfer comment: cues for UE use and controlling descent to sitting.    Ambulation/Gait Ambulation/Gait assistance: Min guard Ambulation Distance (Feet): 50 Feet Assistive device: Rolling walker (2 wheeled) Gait Pattern/deviations: Step-through pattern;Decreased stride length     General Gait Details: pt moves very  slowly and needs cueing for upright psoture and less leaning on UEs.     Stairs            Wheelchair Mobility    Modified Rankin (Stroke Patients Only)       Balance Overall balance assessment: Needs assistance Sitting-balance support: No upper extremity supported;Feet supported Sitting balance-Leahy Scale: Fair     Standing balance support: No upper extremity supported Standing balance-Leahy Scale: Fair                      Cognition Arousal/Alertness: Awake/alert Behavior During Therapy: WFL for tasks assessed/performed Overall Cognitive Status: Within Functional Limits for tasks assessed                      Exercises      General Comments        Pertinent Vitals/Pain Pain Assessment: 0-10 Pain Score: 4  Pain Location: Back Pain Descriptors / Indicators: Aching Pain Intervention(s): Repositioned;Premedicated before session    Home Living                      Prior Function            PT Goals (current goals can now be found in the care plan section) Acute Rehab PT Goals Patient Stated Goal: to rehab then home PT Goal Formulation: With patient Time For Goal Achievement: 09/08/14 Potential to Achieve Goals: Good Progress towards PT goals: Progressing toward goals    Frequency  Min 5X/week    PT Plan Current plan remains appropriate    Co-evaluation  End of Session Equipment Utilized During Treatment: Gait belt;Back brace Activity Tolerance: Patient tolerated treatment well Patient left: with call bell/phone within reach (On 3-in-1 )     Time: 1132-1200 PT Time Calculation (min): 28 min  Charges:  $Gait Training: 8-22 mins $Therapeutic Activity: 8-22 mins                    G CodesCatarina Hartshorn, [place] 09/04/2014, 12:11 PM

## 2014-09-04 NOTE — Progress Notes (Signed)
       Patient Name: Ellen Henry Date of Encounter: 09/04/2014    SUBJECTIVE:The patient denies dyspnea and chest discomfort.  TELEMETRY:  NSR previously documented Filed Vitals:   09/03/14 1419 09/03/14 2117 09/04/14 0138 09/04/14 0536  BP: 123/71 151/88 125/73 145/90  Pulse: 91 85 92 90  Temp: 99.7 F (37.6 C) 99.4 F (37.4 C) 99.3 F (37.4 C) 98.8 F (37.1 C)  TempSrc: Oral Oral Oral Oral  Resp:  18 18 18   Height:      Weight:      SpO2: 100% 100% 97% 99%    Intake/Output Summary (Last 24 hours) at 09/04/14 0747 Last data filed at 09/03/14 2300  Gross per 24 hour  Intake   1062 ml  Output      0 ml  Net   1062 ml   LABS: Basic Metabolic Panel:  Recent Labs  09/02/14 0256 09/03/14 0416  NA 141 141  K 3.6* 3.5*  CL 108 108  CO2 22 23  GLUCOSE 96 89  BUN 11 8  CREATININE 0.65 0.60  CALCIUM 8.5 9.0  MG  --  1.8  PHOS  --  2.1*   CBC:  Recent Labs  09/02/14 0344 09/02/14 2138 09/03/14 0416  WBC 8.4  --  6.5  HGB 7.4* 8.4* 8.2*  HCT 22.4* 25.4* 24.9*  MCV 81.8  --  81.1  PLT 154  --  203   Cardiac Enzymes:  Recent Labs  09/02/14 1600 09/02/14 2138 09/03/14 0416  TROPONINI 1.84* 1.83* 1.41*   BNP: No components found with this basename: POCBNP,  Hemoglobin A1C: No results found for this basename: HGBA1C,  in the last 72 hours Fasting Lipid Panel: No results found for this basename: CHOL, HDL, LDLCALC, TRIG, CHOLHDL, LDLDIRECT,  in the last 72 hours  Radiology/Studies:  Cardiac enlargement by CXR 09/02/14  Echocardiogram: 09/03/14 Study Conclusions  - Left ventricle: The cavity size was normal. Wall thickness was increased in a pattern of mild LVH. Systolic function was normal. The estimated ejection fraction was in the range of 60% to 65%. Wall motion was normal; there were no regional wall motion abnormalities. Left ventricular diastolic function parameters were normal. - Aortic valve: Valve area (VTI): 1.98 cm^2. Valve area  (Vmax): 1.92 cm^2. Valve area (Vmean): 1.98 cm^2. - Mitral valve: Calcified annulus. Mildly thickened leaflets . - Pulmonary arteries: PA peak pressure: 47 mm Hg (S).   Physical Exam: Blood pressure 145/90, pulse 90, temperature 98.8 F (37.1 C), temperature source Oral, resp. rate 18, height 5\' 3"  (1.6 m), weight 249 lb 9 oz (113.2 kg), SpO2 99.00%. Weight change:   Wt Readings from Last 3 Encounters:  08/31/14 249 lb 9 oz (113.2 kg)  08/31/14 249 lb 9 oz (113.2 kg)  08/31/14 249 lb 9 oz (113.2 kg)    Chest is clear Cardiac exam positive for S4 gallop  ASSESSMENT:  1. Elevated troponin due to demand ischemia in setting of fever, hypotension, and anemia. LV function is normal. 2. Elevated BNP in setting of IV fluid resuscitation 3. Sepsis resolved 4. Severe anemia resolved  Plan:  1. Watch for clinical evidence of CHF 2. OP nuclear perfusion study as OP when recovered from Lumbar surgery. 3. Please call if we can help further.  Demetrios Isaacs 09/04/2014, 7:47 AM

## 2014-09-04 NOTE — Clinical Social Work Psychosocial (Signed)
Clinical Social Work Department CLINICAL SOCIAL WORK PLACEMENT NOTE 09/04/2014  Patient:  KELIN, NIXON  Account Number:  [id number] Admit date:  08/29/2014  Clinical Social Worker:  Kemper Durie, [place]  Date/time:  09/04/2014 01:57 PM  Clinical Social Work is seeking post-discharge placement for this patient at the following level of care:   [place]   (*CSW will update this form in Epic as items are completed)   09/03/2014  Patient/family provided with [place] Department of Clinical Social Work's list of facilities offering this level of care within the geographic area requested by the patient (or if unable, by the patient's family).  09/03/2014  Patient/family informed of their freedom to choose among providers that offer the needed level of care, that participate in Medicare, Medicaid or managed care program needed by the patient, have an available bed and are willing to accept the patient.  09/03/2014  Patient/family informed of [facility]' ownership interest in [facility], as well as of the fact that they are under no obligation to receive care at this facility.  PASARR submitted to EDS on 09/03/2014 PASARR number received on 09/03/2014  FL2 transmitted to all facilities in geographic area requested by pt/family on  09/04/2014 FL2 transmitted to all facilities within larger geographic area on   Patient informed that his/her managed care company has contracts with or will negotiate with  certain facilities, including the following:     Patient/family informed of bed offers received:  09/04/2014 Patient chooses bed at [facility] Physician recommends and patient chooses bed at    Patient to be transferred to [facility] on  09/04/2014 Patient to be transferred to facility by Ambulance Patient and family notified of transfer on 09/04/2014 Name of family member notified:  Theresa Diea  The following physician request were entered in  Epic:   Additional Comments:  Per MD patient ready for DC to [facility]. RN, patient, patient's family, and facility notified of DC. RN given number for report. DC packet on chart. [facility] transport requested for patient. CSW signing off.   Liz Beach MSW, , [place], [phone]

## 2014-09-05 ENCOUNTER — Encounter: Payer: Self-pay | Admitting: Adult Health

## 2014-09-05 ENCOUNTER — Non-Acute Institutional Stay (SKILLED_NURSING_FACILITY): Payer: Medicare Other | Admitting: Adult Health

## 2014-09-05 DIAGNOSIS — G589 Mononeuropathy, unspecified: Secondary | ICD-10-CM

## 2014-09-05 DIAGNOSIS — D62 Acute posthemorrhagic anemia: Secondary | ICD-10-CM

## 2014-09-05 DIAGNOSIS — I959 Hypotension, unspecified: Secondary | ICD-10-CM

## 2014-09-05 DIAGNOSIS — M5416 Radiculopathy, lumbar region: Secondary | ICD-10-CM

## 2014-09-05 DIAGNOSIS — G629 Polyneuropathy, unspecified: Secondary | ICD-10-CM | POA: Insufficient documentation

## 2014-09-05 DIAGNOSIS — E785 Hyperlipidemia, unspecified: Secondary | ICD-10-CM | POA: Insufficient documentation

## 2014-09-05 DIAGNOSIS — IMO0002 Reserved for concepts with insufficient information to code with codable children: Secondary | ICD-10-CM

## 2014-09-05 DIAGNOSIS — M109 Gout, unspecified: Secondary | ICD-10-CM

## 2014-09-05 LAB — TYPE AND SCREEN
ABO/RH(D): A POS
Antibody Screen: NEGATIVE
Unit division: 0
Unit division: 0

## 2014-09-05 NOTE — Progress Notes (Signed)
Patient ID: Ellen Henry, female   DOB: 1946-10-19, 68 y.o.   MRN: [id number]   09/05/2014  Facility:  Nursing Home Location:  [place] Room Number: 220-2 LEVEL OF CARE:  SNF (31)   Chief Complaint  Patient presents with  . New Admit To SNF    HISTORY OF PRESENT ILLNESS: This is a 68 year old female who has been admitted to [facility] on 09/04/14 from [facility] with radiculopathy status post anterior/posterior L4-L5 lumbar fusion. She has been admitted for a short-term rehabilitation.  REASSESSMENT OF ONGOING PROBLEMS:  HYPOTENSION: The hypotension remains stable.  Antihypertensive medication was recently discontinued.  No dizziness or lightheadedness reported. Last BP 127/79  GOUT: Patien'st  gout remains stable. Patient denies joint pan, redness, swelling or warmth. No complications reported from the medications presently being used.  ANEMIA: The patient denies fatigue, melena or hematochezia. No complications from the medications currently being used. 9/15 hgb 8.2  PAST MEDICAL HISTORY:  Past Medical History  Diagnosis Date  . Hypertension   . Arthritis   . Gout     CURRENT MEDICATIONS: Reviewed per MAR/see medication list  Allergies  Allergen Reactions  . Food Allergy Formula Other (See Comments)    Shellfish-banana-beef-flares up her gout    REVIEW OF SYSTEMS:  GENERAL: no change in appetite, no fatigue, no weight changes, no fever, chills or weakness RESPIRATORY: no cough, SOB, DOE, wheezing, hemoptysis CARDIAC: no chest pain, edema or palpitations GI: no abdominal pain, diarrhea, constipation, heart burn, nausea or vomiting  PHYSICAL EXAMINATION  GENERAL: no acute distress, obese EYES: conjunctivae normal, sclerae normal, normal eye lids NECK: supple, trachea midline, no neck masses, no thyroid tenderness, no thyromegaly LYMPHATICS: no LAN in the neck, no supraclavicular LAN RESPIRATORY: breathing is even &  unlabored, BS CTAB CARDIAC: RRR, no murmur,no extra heart sounds, no edema GI: abdomen soft, normal BS, no masses, no tenderness, no hepatomegaly, no splenomegaly EXTREMITIES:  Able to move all 4 extremities PSYCHIATRIC: the patient is alert & oriented to person, affect & behavior appropriate  LABS/RADIOLOGY: Labs reviewed: Basic Metabolic Panel:  Recent Labs  09/01/14 1230 09/02/14 0256 09/03/14 0416 09/04/14 0750  NA 140 141 141  --   K 3.9 3.6* 3.5*  --   CL 107 108 108  --   CO2 23 22 23   --   GLUCOSE 96 96 89  --   BUN 15 11 8   --   CREATININE 0.76 0.65 0.60  --   CALCIUM 8.3* 8.5 9.0  --   MG  --   --  1.8 1.9  PHOS  --   --  2.1* 1.9*   Liver Function Tests:  Recent Labs  08/21/14 1203 09/02/14 0256 09/03/14 0416  AST 18 37 36  ALT 13 14 17   ALKPHOS 114 92 110  BILITOT 0.3 0.4 0.5  PROT 7.8 5.7* 6.0  ALBUMIN 3.8 2.3* 2.3*   CBC:  Recent Labs  11/12/13 0942 08/21/14 1203  09/02/14 0256 09/02/14 0344 09/02/14 2138 09/03/14 0416  WBC 9.5 11.6*  < > 8.1 8.4  --  6.5  NEUTROABS 4.1 4.5  --   --   --   --   --   HGB 13.0 13.3  < > 7.4* 7.4* 8.4* 8.2*  HCT 39.0 39.5  < > 21.8* 22.4* 25.4* 24.9*  MCV 84.2 81.6  < > 81.3 81.8  --  81.1  PLT 318 282  < > 152  154  --  203  < > = values in this interval not displayed.  Cardiac Enzymes:  Recent Labs  09/02/14 1600 09/02/14 2138 09/03/14 0416  TROPONINI 1.84* 1.83* 1.41*   Dg Chest 2 View  08/21/2014   CLINICAL DATA:  Preoperative respiratory evaluation prior to lumbar spine surgery. Smoker with current history of hypertension.  EXAM: CHEST  2 VIEW  COMPARISON:  Portable chest x-ray 11/28/2009. Two-view chest x-ray 11/21/2009.  FINDINGS: Cardiac silhouette normal in size, unchanged. Thoracic aorta mildly atherosclerotic, not significantly changed. Hilar and mediastinal contours otherwise unremarkable. Minimal linear scarring in the lingula, unchanged. Lungs otherwise clear. No localized airspace  consolidation. No pleural effusions. No pneumothorax. Normal pulmonary vascularity. Degenerative changes involving the thoracic spine. No significant interval change.  IMPRESSION: No acute cardiopulmonary disease.  Stable examination.   Electronically Signed   By: Ellen Henry M.D.   On: 08/21/2014 13:51   Dg Lumbar Spine 2-3 Views  08/30/2014   CLINICAL DATA:  L4-L5 posterior fusion.  Operative imaging.  EXAM: DG C-ARM 61-120 MIN; LUMBAR SPINE - 2-3 VIEW  COMPARISON:  Lumbar MRI, 02/03/2014.  FINDINGS: Submitted images show an anterior fusion plate and screws extending from L4-L5 and posterior left pedicle screws and interconnecting rod also extending from L4-L5. Radiolucent disc spacer maintains disc height, well-centered. No acute fracture or evidence of an operative complication.  IMPRESSION: Operative images from a L4-L5 fusion as described.   Electronically Signed   By: Ellen Henry M.D.   On: 08/30/2014 10:24   Dg Lumbar Spine 2-3 Views  08/29/2014   CLINICAL DATA:  L4-5 interbody fusion.  EXAM: LUMBAR SPINE - 2-3 VIEW; DG C-ARM 61-120 MIN  COMPARISON:  MRI scan of February 03, 2014.  FINDINGS: Two intraoperative fluoroscopic images were obtained of the lower lumbar spine. These images demonstrate surgical anterior and interbody fusion of L4-5 with good alignment of vertebral bodies.  IMPRESSION: Surgical anterior and interbody fusion of L4-5.   Electronically Signed   By: Ellen Henry M.D.   On: 08/29/2014 14:11   Dg Chest Port 1 View  09/02/2014   CLINICAL DATA:  Respiratory distress, history hypertension  EXAM: PORTABLE CHEST - 1 VIEW  COMPARISON:  Portable exam 0522 hr compared to 08/31/2014  FINDINGS: LEFT jugular central venous catheter tip projecting over SVC and confluence of azygos vein.  Enlargement of cardiac silhouette.  Mediastinal contours and pulmonary vascularity normal.  Lungs clear.  No pleural effusion, pneumothorax or acute osseous findings.  IMPRESSION: No acute  abnormalities.  Enlargement of cardiac silhouette.   Electronically Signed   By: Ellen Henry M.D.   On: 09/02/2014 08:07   Dg Chest Port 1 View  08/31/2014   CLINICAL DATA:  CVC scratch has Central line placement.  EXAM: PORTABLE CHEST - 1 VIEW  COMPARISON:  08/31/2014  FINDINGS: Left central line is in place. The tip projects lateral to the mediastinal contour and may be within the azygos vein. No pneumothorax. Bibasilar atelectasis. Heart is normal size. No effusions or acute bony abnormality.  IMPRESSION: Left central line tip possibly within the azygos vein. No pneumothorax.  Bibasilar atelectasis.   Electronically Signed   By: Rolm Baptise M.D.   On: 08/31/2014 11:58   Dg Chest Port 1 View  08/31/2014   CLINICAL DATA:  Fever.  Evaluate for pneumonia.  EXAM: PORTABLE CHEST - 1 VIEW  COMPARISON:  08/21/2014  FINDINGS: Heart is upper limits normal in size. Linear densities in the lingula and left  base, likely atelectasis. No confluent opacity on the right. No effusions or acute bony abnormality.  IMPRESSION: Left basilar linear densities, likely atelectasis.   Electronically Signed   By: Rolm Baptise M.D.   On: 08/31/2014 09:34   Dg C-arm 1-60 Min  08/30/2014   CLINICAL DATA:  L4-L5 posterior fusion.  Operative imaging.  EXAM: DG C-ARM 61-120 MIN; LUMBAR SPINE - 2-3 VIEW  COMPARISON:  Lumbar MRI, 02/03/2014.  FINDINGS: Submitted images show an anterior fusion plate and screws extending from L4-L5 and posterior left pedicle screws and interconnecting rod also extending from L4-L5. Radiolucent disc spacer maintains disc height, well-centered. No acute fracture or evidence of an operative complication.  IMPRESSION: Operative images from a L4-L5 fusion as described.   Electronically Signed   By: Ellen Henry M.D.   On: 08/30/2014 10:24   Dg C-arm 1-60 Min  08/29/2014   CLINICAL DATA:  L4-5 interbody fusion.  EXAM: LUMBAR SPINE - 2-3 VIEW; DG C-ARM 61-120 MIN  COMPARISON:  MRI scan of February 03, 2014.   FINDINGS: Two intraoperative fluoroscopic images were obtained of the lower lumbar spine. These images demonstrate surgical anterior and interbody fusion of L4-5 with good alignment of vertebral bodies.  IMPRESSION: Surgical anterior and interbody fusion of L4-5.   Electronically Signed   By: Ellen Henry M.D.   On: 08/29/2014 14:11   Dg Or Local Abdomen  08/29/2014   CLINICAL DATA:  Interoperative foreign body count  EXAM: OR LOCAL ABDOMEN  COMPARISON:  11/28/2009  FINDINGS: Bowel gas pattern is nonobstructive. Fusion hardware is present at the L4-5 level compatible patient's ALIF. There are a few surgical clips at this level. Otherwise, no evidence of foreign body.  IMPRESSION: Postsurgical change compatible with ALIF.  No foreign body.  These results were called by telephone at the time of interpretation on 08/29/2014 at 12:58 pm to OR room 5.   Electronically Signed   By: Marin Olp M.D.   On: 08/29/2014 12:59    ASSESSMENT/PLAN:  Radiculopathy status post anterior/posterior L4-L5 lumbar fusion -  for rehabilitation; TLSO at all times when out of bed except trips to bathroom Hypotension - recently discontinued olmesartan/amlodipine/HCTZ; BP every shift x1 week UTI -  continue Vantin Hyperlipidemia - continue Lipitor Gout - continue colchicine Anemia, acute blood loss - check CBC Neuropathy - stable; continue Neurontin and OxyContin ER  CPT CODE: 16109    [facility], NP [place] [phone]

## 2014-09-06 LAB — CULTURE, BLOOD (ROUTINE X 2): Culture: NO GROWTH

## 2014-09-07 ENCOUNTER — Non-Acute Institutional Stay (SKILLED_NURSING_FACILITY): Payer: Medicare Other | Admitting: Internal Medicine

## 2014-09-07 DIAGNOSIS — M5416 Radiculopathy, lumbar region: Secondary | ICD-10-CM

## 2014-09-07 DIAGNOSIS — I10 Essential (primary) hypertension: Secondary | ICD-10-CM

## 2014-09-07 DIAGNOSIS — K5900 Constipation, unspecified: Secondary | ICD-10-CM | POA: Insufficient documentation

## 2014-09-07 DIAGNOSIS — D62 Acute posthemorrhagic anemia: Secondary | ICD-10-CM

## 2014-09-07 LAB — CULTURE, BLOOD (ROUTINE X 2): Culture: NO GROWTH

## 2014-09-07 NOTE — Progress Notes (Signed)
HISTORY & PHYSICAL  DATE: 09/07/2014   FACILITY: [facility]  LEVEL OF CARE: SNF (31)  ALLERGIES:  Allergies  Allergen Reactions  . Food Allergy Formula Other (See Comments)    Shellfish-banana-beef-flares up her gout    CHIEF COMPLAINT:  Manage radiculopathy,, acute blood loss anemia and hypertension  HISTORY OF PRESENT ILLNESS: Patient is a 68 year old African American female  RADICULOPATHY: Patient was having bilateral lower extremity neuropathic pain. She underwent anterior/posterior L4-5 lumbar fusion and tolerated the procedure well. She is admitted to this facility for short-term rehabilitation. She denies pain currently. She is tolerating her pain medications without any side effects.  ANEMIA: The anemia has been stable. The patient denies fatigue, melena or hematochezia. No complications from the medications currently being used. Postoperatively the patient suffered an acute blood loss. She received blood transfusion and hemoglobin was stable at 8.2 afterwards.  HTN: Pt 's HTN remains stable.  Denies CP, sob, DOE, headaches, dizziness or visual disturbances.  No complications from the medications currently being used. Complains of bilateral lower extremity swelling.  Last BP : 146/90.  PAST MEDICAL HISTORY :  Past Medical History  Diagnosis Date  . Hypertension   . Arthritis   . Gout     PAST SURGICAL HISTORY: Past Surgical History  Procedure Laterality Date  . 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 Appendectomy    . Tonsillectomy    . Joint replacement    . Abdominal hysterectomy    . Carpal tunnel release Right     release done twice  . Anterior lumbar fusion N/A 08/29/2014    Procedure: ANTERIOR LUMBAR FUSION 1 LEVEL;  Surgeon: Sinclair Ship, MD;  Location: [facility];  Service: Orthopedics;  Laterality: N/A;  Lumbar 4-5 anterior lumbar interbody fusion with allograft and instrumentation.  . Abdominal exposure N/A  08/29/2014    Procedure: ABDOMINAL EXPOSURE;  Surgeon: Rosetta Posner, MD;  Location: [facility];  Service: Vascular;  Laterality: N/A;    SOCIAL HISTORY:  reports that she has been smoking Cigarettes.  She has a 1.25 pack-year smoking history. She has never used smokeless tobacco. She reports that she does not drink alcohol or use illicit drugs.  FAMILY HISTORY:  Family History  Problem Relation Age of Onset  . Varicose Veins Mother     CURRENT MEDICATIONS: Reviewed per MAR/see medication list  REVIEW OF SYSTEMS:  See HPI otherwise 14 point ROS is negative.  PHYSICAL EXAMINATION  VS:  See VS section  GENERAL: no acute distress, morbidly obese body habitus EYES: conjunctivae normal, sclerae normal, normal eye lids MOUTH/THROAT: lips without lesions,no lesions in the mouth,tongue is without lesions,uvula elevates in midline NECK: supple, trachea midline, no neck masses, no thyroid tenderness, no thyromegaly LYMPHATICS: no LAN in the neck, no supraclavicular LAN RESPIRATORY: breathing is even & unlabored, BS CTAB CARDIAC: RRR, no murmur,no extra heart sounds, +4 bilateral lower extremity edema GI:  ABDOMEN: abdomen soft, normal BS, no masses, no tenderness  LIVER/SPLEEN: no hepatomegaly, no splenomegaly MUSCULOSKELETAL: HEAD: normal to inspection  EXTREMITIES: LEFT UPPER EXTREMITY: full range of motion, normal strength & tone RIGHT UPPER EXTREMITY:  full range of motion, normal strength & tone LEFT LOWER EXTREMITY:  Minimal range of motion, normal strength & tone RIGHT LOWER EXTREMITY: Minimal range of motion, normal strength & tone PSYCHIATRIC: the patient is alert & oriented to person, affect & behavior appropriate  LABS/RADIOLOGY:  Labs  reviewed: Basic Metabolic Panel:  Recent Labs  09/01/14 1230 09/02/14 0256 09/03/14 0416 09/04/14 0750  NA 140 141 141  --   K 3.9 3.6* 3.5*  --   CL 107 108 108  --   CO2 23 22 23   --   GLUCOSE 96 96 89  --   BUN 15 11 8   --     CREATININE 0.76 0.65 0.60  --   CALCIUM 8.3* 8.5 9.0  --   MG  --   --  1.8 1.9  PHOS  --   --  2.1* 1.9*   Liver Function Tests:  Recent Labs  08/21/14 1203 09/02/14 0256 09/03/14 0416  AST 18 37 36  ALT 13 14 17   ALKPHOS 114 92 110  BILITOT 0.3 0.4 0.5  PROT 7.8 5.7* 6.0  ALBUMIN 3.8 2.3* 2.3*   CBC:  Recent Labs  11/12/13 0942 08/21/14 1203  09/02/14 0256 09/02/14 0344 09/02/14 2138 09/03/14 0416  WBC 9.5 11.6*  < > 8.1 8.4  --  6.5  NEUTROABS 4.1 4.5  --   --   --   --   --   HGB 13.0 13.3  < > 7.4* 7.4* 8.4* 8.2*  HCT 39.0 39.5  < > 21.8* 22.4* 25.4* 24.9*  MCV 84.2 81.6  < > 81.3 81.8  --  81.1  PLT 318 282  < > 152 154  --  203  < > = values in this interval not displayed.  Cardiac Enzymes:  Recent Labs  09/02/14 1600 09/02/14 2138 09/03/14 0416  TROPONINI 1.84* 1.83* 1.41*    Transthoracic Echocardiography  Patient:    Tyreka, Henneke MR #:       [id number] Study Date: 09/03/2014 Gender:     F Age:        22 Height:     160 cm Weight:     112.9 kg BSA:        2.31 m^2 Pt. Status: Room:       5W12C   ATTENDING    Ghimire, Claud Kelp, Shanker M  REFERRING    Ghimire, Shanker M  ADMITTING    Sinclair Ship  PERFORMING   [facility], Inpatient  SONOGRAPHER  Roseanna Rainbow  cc:  ------------------------------------------------------------------- LV EF: 60% -   65%  ------------------------------------------------------------------- History:   PMH:  Septic shock. Elevated troponin.  ------------------------------------------------------------------- Study Conclusions  - Left ventricle: The cavity size was normal. Wall thickness was   increased in a pattern of mild LVH. Systolic function was normal.   The estimated ejection fraction was in the range of 60% to 65%.   Wall motion was normal; there were no regional wall motion   abnormalities. Left ventricular diastolic function parameters   were normal. - Aortic  valve: Valve area (VTI): 1.98 cm^2. Valve area (Vmax):   1.92 cm^2. Valve area (Vmean): 1.98 cm^2. - Mitral valve: Calcified annulus. Mildly thickened leaflets . - Pulmonary arteries: PA peak pressure: 47 mm Hg (S).  Transthoracic echocardiography.  M-mode, complete 2D, spectral Doppler, and color Doppler.  Birthdate:  Patient birthdate: 30-Jun-1946.  Age:  Patient is 68 yr old.  Sex:  Gender: female. BMI: 44.1 kg/m^2.  Blood pressure:     120/80  Patient status: Inpatient.  Study date:  Study date: 09/03/2014. Study time: 12:16 PM.  Location:  Bedside.  -------------------------------------------------------------------  ------------------------------------------------------------------- Left ventricle:  The cavity size was normal. Wall thickness was increased  in a pattern of mild LVH. Systolic function was normal. The estimated ejection fraction was in the range of 60% to 65%. Wall motion was normal; there were no regional wall motion abnormalities. The transmitral flow pattern was normal. The deceleration time of the early transmitral flow velocity was normal. The pulmonary vein flow pattern was normal. The tissue Doppler parameters were normal. Left ventricular diastolic function parameters were normal.  ------------------------------------------------------------------- Aortic valve:   Trileaflet; mildly thickened, mildly calcified leaflets. Sclerosis without stenosis.  Doppler:  There was no significant regurgitation.    VTI ratio of LVOT to aortic valve: 0.63. Valve area (VTI): 1.98 cm^2. Indexed valve area (VTI): 0.86 cm^2/m^2. Valve area (Vmax): 1.92 cm^2. Indexed valve area (Vmax): 0.83 cm^2/m^2. Mean velocity ratio of LVOT to aortic valve: 0.63. Valve area (Vmean): 1.98 cm^2. Indexed valve area (Vmean): 0.86 cm^2/m^2.    Mean gradient (S): 8 mm Hg. Peak gradient (S): 17 mm Hg.  ------------------------------------------------------------------- Aorta:  The aorta was  normal, not dilated, and non-diseased.  ------------------------------------------------------------------- Mitral valve:   Calcified annulus. Mildly thickened leaflets . Doppler:  There was no significant regurgitation.    Peak gradient (D): 3 mm Hg.  ------------------------------------------------------------------- Left atrium:  The atrium was normal in size.  ------------------------------------------------------------------- Right ventricle:  The cavity size was normal. Wall thickness was normal. Systolic function was normal.  ------------------------------------------------------------------- Pulmonic valve:    The valve appears to be grossly normal. Doppler:  There was no significant regurgitation.  ------------------------------------------------------------------- Tricuspid valve:   Structurally normal valve.   Leaflet separation was normal.  Doppler:  Transvalvular velocity was within the normal range. There was mild regurgitation.  ------------------------------------------------------------------- Right atrium:  The atrium was at the upper limits of normal in size.  ------------------------------------------------------------------- Pericardium:  There was no pericardial effusion.   ------------------------------------------------------------------- Post procedure conclusions Ascending Aorta:  - The aorta was normal, not dilated, and non-diseased.  ------------------------------------------------------------------- Measurements   Left ventricle                            Value          Reference  LV ID, ED, PLAX chordal                   45.2  mm       43 - 52  LV ID, ES, PLAX chordal                   25.9  mm       23 - 38  LV fx shortening, PLAX chordal            43    %        >=29  LV PW thickness, ED                       13.8  mm       ---------  IVS/LV PW ratio, ED                       0.72           <=1.3  Stroke volume, 2D                          74    ml       ---------  Stroke volume/bsa, 2D  32    ml/m^2   ---------  LV e&', lateral                            10.9  cm/s     ---------  LV E/e&', lateral                          8.5            ---------  LV e&', medial                             11.4  cm/s     ---------  LV E/e&', medial                           8.13           ---------  LV e&', average                            11.15 cm/s     ---------  LV E/e&', average                          8.31           ---------    Ventricular septum                        Value          Reference  IVS thickness, ED                         9.91  mm       ---------    LVOT                                      Value          Reference  LVOT ID, S                                20    mm       ---------  LVOT area                                 3.14  cm^2     ---------  LVOT peak velocity, S                     125   cm/s     ---------  LVOT mean velocity, S                     83.3  cm/s     ---------  LVOT VTI, S                               23.7  cm       ---------  LVOT peak gradient, S  6     mm Hg    ---------    Aortic valve                              Value          Reference  Aortic valve mean velocity, S             132   cm/s     ---------  Aortic valve VTI, S                       37.6  cm       ---------  Aortic mean gradient, S                   8     mm Hg    ---------  Aortic peak gradient, S                   17    mm Hg    ---------  VTI ratio, LVOT/AV                        0.63           ---------  Aortic valve area, VTI                    1.98  cm^2     ---------  Aortic valve area/bsa, VTI                0.86  cm^2/m^2 ---------  Aortic valve area, peak velocity          1.92  cm^2     ---------  Aortic valve area/bsa, peak               0.83  cm^2/m^2 ---------  velocity  Velocity ratio, mean, LVOT/AV             0.63           ---------  Aortic valve area, mean velocity           1.98  cm^2     ---------  Aortic valve area/bsa, mean               0.86  cm^2/m^2 ---------  velocity    Aorta                                     Value          Reference  Aortic root ID, ED                        30    mm       ---------    Left atrium                               Value          Reference  LA ID, A-P, ES                            32    mm       ---------  LA ID/bsa, A-P  1.39  cm/m^2   <=2.2  LA volume/bsa, S                          20.8  ml/m^2   ---------  LA volume, ES, 1-p A4C                    48    ml       ---------  LA volume/bsa, ES, 1-p A4C                20.8  ml/m^2   ---------  LA volume, ES, 1-p A2C                    32    ml       ---------  LA volume/bsa, ES, 1-p A2C                13.9  ml/m^2   ---------    Mitral valve                              Value          Reference  Mitral E-wave peak velocity               92.7  cm/s     ---------  Mitral A-wave peak velocity               90.5  cm/s     ---------  Mitral deceleration time                  201   ms       150 - 230  Mitral peak gradient, D                   3     mm Hg    ---------  Mitral E/A ratio, peak                    1              ---------    Pulmonary arteries                        Value          Reference  PA pressure, S, DP                (H)     47    mm Hg    <=30    Tricuspid valve                           Value          Reference  Tricuspid regurg peak velocity            314   cm/s     ---------  Tricuspid peak RV-RA gradient             39    mm Hg    ---------    Systemic veins                            Value          Reference  Estimated CVP  8     mm Hg    ---------    Right ventricle                           Value          Reference  RV pressure, S, DP                (H)     47    mm Hg    <=30  RV s&', lateral, S                         15.2  cm/s     ---------  L LUMBAR SPINE - 2-3 VIEW; DG  C-ARM 61-120 MIN   COMPARISON:  MRI scan of February 03, 2014.   FINDINGS: Two intraoperative fluoroscopic images were obtained of the lower lumbar spine. These images demonstrate surgical anterior and interbody fusion of L4-5 with good alignment of vertebral bodies.   IMPRESSION: Surgical anterior and interbody fusion of L4-5.   LUMBAR SPINE - 2-3 VIEW; DG C-ARM 61-120 MIN   COMPARISON:  MRI scan of February 03, 2014.   FINDINGS: Two intraoperative fluoroscopic images were obtained of the lower lumbar spine. These images demonstrate surgical anterior and interbody fusion of L4-5 with good alignment of vertebral bodies.   IMPRESSION: Surgical anterior and interbody fusion of L4-5.   OR LOCAL ABDOMEN   COMPARISON:  11/28/2009   FINDINGS: Bowel gas pattern is nonobstructive. Fusion hardware is present at the L4-5 level compatible patient's ALIF. There are a few surgical clips at this level. Otherwise, no evidence of foreign body.   IMPRESSION: Postsurgical change compatible with ALIF.  No foreign body.   These results were called by telephone at the time of interpretation on 08/29/2014 at 12:58 pm to OR room 5. DG C-ARM 61-120 MIN; LUMBAR SPINE - 2-3 VIEW   COMPARISON:  Lumbar MRI, 02/03/2014.   FINDINGS: Submitted images show an anterior fusion plate and screws extending from L4-L5 and posterior left pedicle screws and interconnecting rod also extending from L4-L5. Radiolucent disc spacer maintains disc height, well-centered. No acute fracture or evidence of an operative complication.   IMPRESSION: Operative images from a L4-L5 fusion as described.   DG C-ARM 61-120 MIN; LUMBAR SPINE - 2-3 VIEW   COMPARISON:  Lumbar MRI, 02/03/2014.   FINDINGS: Submitted images show an anterior fusion plate and screws extending from L4-L5 and posterior left pedicle screws and interconnecting rod also extending from L4-L5. Radiolucent disc spacer maintains disc height,  well-centered. No acute fracture or evidence of an operative complication.   IMPRESSION: Operative images from a L4-L5 fusion as described. PORTABLE CHEST - 1 VIEW   COMPARISON:  Portable exam 0522 hr compared to 08/31/2014   FINDINGS: LEFT jugular central venous catheter tip projecting over SVC and confluence of azygos vein.   Enlargement of cardiac silhouette.   Mediastinal contours and pulmonary vascularity normal.   Lungs clear.   No pleural effusion, pneumothorax or acute osseous findings.   IMPRESSION: No acute abnormalities.   Enlargement of cardiac silhouette.    ASSESSMENT/PLAN:  Radiculopathy-status post lumbar fusion. Continue rehabilitation and pain management. Hypertension-blood pressure borderline. Will monitor. Acute blood loss anemia-status post transfusion. Check hemoglobin. Constipation-MiraLax was increased. Hyperlipidemia-continue lipitor. Arthritis-continue pain medications Check CBC and BMP  I have reviewed patient's medical records received at admission/from hospitalization.  CPT CODE: 20254  Gayani Y Dasanayaka,  MD [place] [phone]

## 2014-09-10 ENCOUNTER — Non-Acute Institutional Stay (SKILLED_NURSING_FACILITY): Payer: Medicare Other | Admitting: Adult Health

## 2014-09-10 ENCOUNTER — Encounter: Payer: Self-pay | Admitting: Adult Health

## 2014-09-10 DIAGNOSIS — D62 Acute posthemorrhagic anemia: Secondary | ICD-10-CM

## 2014-09-14 ENCOUNTER — Non-Acute Institutional Stay (SKILLED_NURSING_FACILITY): Payer: Medicare Other | Admitting: Adult Health

## 2014-09-14 ENCOUNTER — Encounter: Payer: Self-pay | Admitting: Adult Health

## 2014-09-14 DIAGNOSIS — E785 Hyperlipidemia, unspecified: Secondary | ICD-10-CM

## 2014-09-14 DIAGNOSIS — I959 Hypotension, unspecified: Secondary | ICD-10-CM

## 2014-09-14 DIAGNOSIS — E876 Hypokalemia: Secondary | ICD-10-CM

## 2014-09-14 DIAGNOSIS — D62 Acute posthemorrhagic anemia: Secondary | ICD-10-CM

## 2014-09-14 DIAGNOSIS — G629 Polyneuropathy, unspecified: Secondary | ICD-10-CM

## 2014-09-14 DIAGNOSIS — M109 Gout, unspecified: Secondary | ICD-10-CM

## 2014-09-14 DIAGNOSIS — M5416 Radiculopathy, lumbar region: Secondary | ICD-10-CM

## 2014-09-14 NOTE — Progress Notes (Signed)
Patient ID: Ellen Henry, female   DOB: 10-10-1946, 68 y.o.   MRN: [id number]   09/14/2014  Facility:  Nursing Home Location:  [place] Room Number: 263-3 LEVEL OF CARE:  SNF (31)   Chief Complaint  Patient presents with  . Discharge Note    HISTORY OF PRESENT ILLNESS: This is a 68 year old female who is for discharge home with home health PT, OT, nursing and CNA. DME: Rolling walker and bedside commode . She has been admitted to [facility] on 09/04/14 from [facility] with radiculopathy status post anterior/posterior L4-L5 lumbar fusion. Patient was admitted to this facility for short-term rehabilitation after the patient's recent hospitalization.  Patient has completed SNF rehabilitation and therapy has cleared the patient for discharge.   REASSESSMENT OF ONGOING PROBLEMS:  HYPOTENSION: The hypotension remains stable.  Antihypertensive medication was recently discontinued.  No dizziness or lightheadedness reported. Last BP 127/74  PERIPHERAL NEUROPATHY: The peripheral neuropathy is stable. The patient denies pain in the feet, tingling, and numbness. No complications noted from the medication presently being used.  HYPERLIPIDEMIA: No complications from the medications presently being used. Last fasting lipid panel showed : not available  PAST MEDICAL HISTORY:  Past Medical History  Diagnosis Date  . Hypertension   . Arthritis   . Gout     CURRENT MEDICATIONS: Reviewed per MAR/see medication list  Allergies  Allergen Reactions  . Food Allergy Formula Other (See Comments)    Shellfish-banana-beef-flares up her gout    REVIEW OF SYSTEMS:  GENERAL: no change in appetite, no fatigue, no weight changes, no fever, chills or weakness RESPIRATORY: no cough, SOB, DOE, wheezing, hemoptysis CARDIAC: no chest pain, edema or palpitations GI: no abdominal pain, diarrhea, constipation, heart burn, nausea or vomiting  PHYSICAL  EXAMINATION  GENERAL: no acute distress, obese EYES: conjunctivae normal, sclerae normal, normal eye lids NECK: supple, trachea midline, no neck masses, no thyroid tenderness, no thyromegaly RESPIRATORY: breathing is even & unlabored, BS CTAB CARDIAC: RRR, no murmur,no extra heart sounds, no edema GI: abdomen soft, normal BS, no masses, no tenderness, no hepatomegaly, no splenomegaly EXTREMITIES:  Able to move all 4 extremities; walks with walker PSYCHIATRIC: the patient is alert & oriented to person, affect & behavior appropriate  LABS/RADIOLOGY: 09/06/14  WBC 9.2 hemoglobin 8.7 hematocrit 28.0 MCV 85.9 sodium 142 potassium 3.3 glucose 95 BUN 5 creatinine 0.5 calcium 9.4 Labs reviewed: Basic Metabolic Panel:  Recent Labs  09/01/14 1230 09/02/14 0256 09/03/14 0416 09/04/14 0750  NA 140 141 141  --   K 3.9 3.6* 3.5*  --   CL 107 108 108  --   CO2 23 22 23   --   GLUCOSE 96 96 89  --   BUN 15 11 8   --   CREATININE 0.76 0.65 0.60  --   CALCIUM 8.3* 8.5 9.0  --   MG  --   --  1.8 1.9  PHOS  --   --  2.1* 1.9*   Liver Function Tests:  Recent Labs  08/21/14 1203 09/02/14 0256 09/03/14 0416  AST 18 37 36  ALT 13 14 17   ALKPHOS 114 92 110  BILITOT 0.3 0.4 0.5  PROT 7.8 5.7* 6.0  ALBUMIN 3.8 2.3* 2.3*   CBC:  Recent Labs  11/12/13 0942 08/21/14 1203  09/02/14 0256 09/02/14 0344 09/02/14 2138 09/03/14 0416  WBC 9.5 11.6*  < > 8.1 8.4  --  6.5  NEUTROABS 4.1 4.5  --   --   --   --   --  HGB 13.0 13.3  < > 7.4* 7.4* 8.4* 8.2*  HCT 39.0 39.5  < > 21.8* 22.4* 25.4* 24.9*  MCV 84.2 81.6  < > 81.3 81.8  --  81.1  PLT 318 282  < > 152 154  --  203  < > = values in this interval not displayed.  Cardiac Enzymes:  Recent Labs  09/02/14 1600 09/02/14 2138 09/03/14 0416  TROPONINI 1.84* 1.83* 1.41*   Dg Chest 2 View  08/21/2014   CLINICAL DATA:  Preoperative respiratory evaluation prior to lumbar spine surgery. Smoker with current history of hypertension.  EXAM:  CHEST  2 VIEW  COMPARISON:  Portable chest x-ray 11/28/2009. Two-view chest x-ray 11/21/2009.  FINDINGS: Cardiac silhouette normal in size, unchanged. Thoracic aorta mildly atherosclerotic, not significantly changed. Hilar and mediastinal contours otherwise unremarkable. Minimal linear scarring in the lingula, unchanged. Lungs otherwise clear. No localized airspace consolidation. No pleural effusions. No pneumothorax. Normal pulmonary vascularity. Degenerative changes involving the thoracic spine. No significant interval change.  IMPRESSION: No acute cardiopulmonary disease.  Stable examination.   Electronically Signed   By: Evangeline Dakin M.D.   On: 08/21/2014 13:51   Dg Lumbar Spine 2-3 Views  08/30/2014   CLINICAL DATA:  L4-L5 posterior fusion.  Operative imaging.  EXAM: DG C-ARM 61-120 MIN; LUMBAR SPINE - 2-3 VIEW  COMPARISON:  Lumbar MRI, 02/03/2014.  FINDINGS: Submitted images show an anterior fusion plate and screws extending from L4-L5 and posterior left pedicle screws and interconnecting rod also extending from L4-L5. Radiolucent disc spacer maintains disc height, well-centered. No acute fracture or evidence of an operative complication.  IMPRESSION: Operative images from a L4-L5 fusion as described.   Electronically Signed   By: Lajean Manes M.D.   On: 08/30/2014 10:24   Dg Lumbar Spine 2-3 Views  08/29/2014   CLINICAL DATA:  L4-5 interbody fusion.  EXAM: LUMBAR SPINE - 2-3 VIEW; DG C-ARM 61-120 MIN  COMPARISON:  MRI scan of February 03, 2014.  FINDINGS: Two intraoperative fluoroscopic images were obtained of the lower lumbar spine. These images demonstrate surgical anterior and interbody fusion of L4-5 with good alignment of vertebral bodies.  IMPRESSION: Surgical anterior and interbody fusion of L4-5.   Electronically Signed   By: Sabino Dick M.D.   On: 08/29/2014 14:11   Dg Chest Port 1 View  09/02/2014   CLINICAL DATA:  Respiratory distress, history hypertension  EXAM: PORTABLE CHEST - 1  VIEW  COMPARISON:  Portable exam 0522 hr compared to 08/31/2014  FINDINGS: LEFT jugular central venous catheter tip projecting over SVC and confluence of azygos vein.  Enlargement of cardiac silhouette.  Mediastinal contours and pulmonary vascularity normal.  Lungs clear.  No pleural effusion, pneumothorax or acute osseous findings.  IMPRESSION: No acute abnormalities.  Enlargement of cardiac silhouette.   Electronically Signed   By: Lavonia Dana M.D.   On: 09/02/2014 08:07   Dg Chest Port 1 View  08/31/2014   CLINICAL DATA:  CVC scratch has Central line placement.  EXAM: PORTABLE CHEST - 1 VIEW  COMPARISON:  08/31/2014  FINDINGS: Left central line is in place. The tip projects lateral to the mediastinal contour and may be within the azygos vein. No pneumothorax. Bibasilar atelectasis. Heart is normal size. No effusions or acute bony abnormality.  IMPRESSION: Left central line tip possibly within the azygos vein. No pneumothorax.  Bibasilar atelectasis.   Electronically Signed   By: Rolm Baptise M.D.   On: 08/31/2014 11:58   Dg Chest  Port 1 View  08/31/2014   CLINICAL DATA:  Fever.  Evaluate for pneumonia.  EXAM: PORTABLE CHEST - 1 VIEW  COMPARISON:  08/21/2014  FINDINGS: Heart is upper limits normal in size. Linear densities in the lingula and left base, likely atelectasis. No confluent opacity on the right. No effusions or acute bony abnormality.  IMPRESSION: Left basilar linear densities, likely atelectasis.   Electronically Signed   By: Rolm Baptise M.D.   On: 08/31/2014 09:34   Dg C-arm 1-60 Min  08/30/2014   CLINICAL DATA:  L4-L5 posterior fusion.  Operative imaging.  EXAM: DG C-ARM 61-120 MIN; LUMBAR SPINE - 2-3 VIEW  COMPARISON:  Lumbar MRI, 02/03/2014.  FINDINGS: Submitted images show an anterior fusion plate and screws extending from L4-L5 and posterior left pedicle screws and interconnecting rod also extending from L4-L5. Radiolucent disc spacer maintains disc height, well-centered. No acute  fracture or evidence of an operative complication.  IMPRESSION: Operative images from a L4-L5 fusion as described.   Electronically Signed   By: Lajean Manes M.D.   On: 08/30/2014 10:24   Dg C-arm 1-60 Min  08/29/2014   CLINICAL DATA:  L4-5 interbody fusion.  EXAM: LUMBAR SPINE - 2-3 VIEW; DG C-ARM 61-120 MIN  COMPARISON:  MRI scan of February 03, 2014.  FINDINGS: Two intraoperative fluoroscopic images were obtained of the lower lumbar spine. These images demonstrate surgical anterior and interbody fusion of L4-5 with good alignment of vertebral bodies.  IMPRESSION: Surgical anterior and interbody fusion of L4-5.   Electronically Signed   By: Sabino Dick M.D.   On: 08/29/2014 14:11   Dg Or Local Abdomen  08/29/2014   CLINICAL DATA:  Interoperative foreign body count  EXAM: OR LOCAL ABDOMEN  COMPARISON:  11/28/2009  FINDINGS: Bowel gas pattern is nonobstructive. Fusion hardware is present at the L4-5 level compatible patient's ALIF. There are a few surgical clips at this level. Otherwise, no evidence of foreign body.  IMPRESSION: Postsurgical change compatible with ALIF.  No foreign body.  These results were called by telephone at the time of interpretation on 08/29/2014 at 12:58 pm to OR room 5.   Electronically Signed   By: Marin Olp M.D.   On: 08/29/2014 12:59    ASSESSMENT/PLAN:  Radiculopathy status post anterior/posterior L4-L5 lumbar fusion - TLSO at all times when out of bed except trips to bathroom; for home health PT, OT, nursing and CNA Hypotension - recently discontinued olmesartan/amlodipine/HCTZ; BP stable Hyperlipidemia - continue Lipitor Gout - continue colchicine as needed Anemia, acute blood loss - continue FeSO4 Neuropathy - stable; continue Neurontin and OxyContin ER   I have filled out patient's discharge paperwork and written prescriptions.  Patient will receive home health PT, OT, Nursing and CNA.  DME provided: Rolling walker and bedside commode  Total discharge  time: Greater than 30 minutes  Discharge time involved coordination of the discharge process with Education officer, museum, nursing staff and therapy department. Medical justification for home health services/DME verified.  CPT CODE: 82423    MEDINA-VARGAS,MONINA, [facility] [phone]

## 2014-09-14 NOTE — Progress Notes (Signed)
Patient ID: Ellen Henry, female   DOB: 12/15/1945, 68 y.o.   MRN: [id number]   09/10/14  Facility:  Nursing Home Location:  [place] Room Number: 258-5 LEVEL OF CARE:  SNF (31)   Chief Complaint  Patient presents with  . Acute Visit    Anemia    HISTORY OF PRESENT ILLNESS: This is a 68 year old female who has radiculopathy status post anterior/posterior L4-L5 lumbar fusion. Latest hgb 8.7 - low. No complaints of dizziness nor shortness of breath.    PAST MEDICAL HISTORY:  Past Medical History  Diagnosis Date  . Hypertension   . Arthritis   . Gout     CURRENT MEDICATIONS: Reviewed per MAR/see medication list  Allergies  Allergen Reactions  . Food Allergy Formula Other (See Comments)    Shellfish-banana-beef-flares up her gout    REVIEW OF SYSTEMS:  GENERAL: no change in appetite, no fatigue, no weight changes, no fever, chills or weakness RESPIRATORY: no cough, SOB, DOE, wheezing, hemoptysis CARDIAC: no chest pain, edema or palpitations GI: no abdominal pain, diarrhea, constipation, heart burn, nausea or vomiting  PHYSICAL EXAMINATION  GENERAL: no acute distress, obese NECK: supple, trachea midline, no neck masses, no thyroid tenderness, no thyromegaly LYMPHATICS: no LAN in the neck, no supraclavicular LAN RESPIRATORY: breathing is even & unlabored, BS CTAB CARDIAC: RRR, no murmur,no extra heart sounds, no edema GI: abdomen soft, normal BS, no masses, no tenderness, no hepatomegaly, no splenomegaly EXTREMITIES:  Able to move all 4 extremities PSYCHIATRIC: the patient is alert & oriented to person, affect & behavior appropriate  LABS/RADIOLOGY: 09/06/14  WBC 9.2 hemoglobin 8.7 hematocrit 28.0 MCV 85.9 sodium 142 potassium 3.3 glucose 95 BUN 5 creatinine 0.5 calcium 9.4 Labs reviewed: Basic Metabolic Panel:  Recent Labs  09/01/14 1230 09/02/14 0256 09/03/14 0416 09/04/14 0750  NA 140 141 141  --   K 3.9 3.6* 3.5*  --     CL 107 108 108  --   CO2 23 22 23   --   GLUCOSE 96 96 89  --   BUN 15 11 8   --   CREATININE 0.76 0.65 0.60  --   CALCIUM 8.3* 8.5 9.0  --   MG  --   --  1.8 1.9  PHOS  --   --  2.1* 1.9*   Liver Function Tests:  Recent Labs  08/21/14 1203 09/02/14 0256 09/03/14 0416  AST 18 37 36  ALT 13 14 17   ALKPHOS 114 92 110  BILITOT 0.3 0.4 0.5  PROT 7.8 5.7* 6.0  ALBUMIN 3.8 2.3* 2.3*   CBC:  Recent Labs  11/12/13 0942 08/21/14 1203  09/02/14 0256 09/02/14 0344 09/02/14 2138 09/03/14 0416  WBC 9.5 11.6*  < > 8.1 8.4  --  6.5  NEUTROABS 4.1 4.5  --   --   --   --   --   HGB 13.0 13.3  < > 7.4* 7.4* 8.4* 8.2*  HCT 39.0 39.5  < > 21.8* 22.4* 25.4* 24.9*  MCV 84.2 81.6  < > 81.3 81.8  --  81.1  PLT 318 282  < > 152 154  --  203  < > = values in this interval not displayed.  Cardiac Enzymes:  Recent Labs  09/02/14 1600 09/02/14 2138 09/03/14 0416  TROPONINI 1.84* 1.83* 1.41*   Dg Chest 2 View  08/21/2014   CLINICAL DATA:  Preoperative respiratory evaluation prior to lumbar spine surgery. Smoker with current history  of hypertension.  EXAM: CHEST  2 VIEW  COMPARISON:  Portable chest x-ray 11/28/2009. Two-view chest x-ray 11/21/2009.  FINDINGS: Cardiac silhouette normal in size, unchanged. Thoracic aorta mildly atherosclerotic, not significantly changed. Hilar and mediastinal contours otherwise unremarkable. Minimal linear scarring in the lingula, unchanged. Lungs otherwise clear. No localized airspace consolidation. No pleural effusions. No pneumothorax. Normal pulmonary vascularity. Degenerative changes involving the thoracic spine. No significant interval change.  IMPRESSION: No acute cardiopulmonary disease.  Stable examination.   Electronically Signed   By: Evangeline Dakin M.D.   On: 08/21/2014 13:51   Dg Lumbar Spine 2-3 Views  08/30/2014   CLINICAL DATA:  L4-L5 posterior fusion.  Operative imaging.  EXAM: DG C-ARM 61-120 MIN; LUMBAR SPINE - 2-3 VIEW  COMPARISON:  Lumbar  MRI, 02/03/2014.  FINDINGS: Submitted images show an anterior fusion plate and screws extending from L4-L5 and posterior left pedicle screws and interconnecting rod also extending from L4-L5. Radiolucent disc spacer maintains disc height, well-centered. No acute fracture or evidence of an operative complication.  IMPRESSION: Operative images from a L4-L5 fusion as described.   Electronically Signed   By: Lajean Manes M.D.   On: 08/30/2014 10:24   Dg Lumbar Spine 2-3 Views  08/29/2014   CLINICAL DATA:  L4-5 interbody fusion.  EXAM: LUMBAR SPINE - 2-3 VIEW; DG C-ARM 61-120 MIN  COMPARISON:  MRI scan of February 03, 2014.  FINDINGS: Two intraoperative fluoroscopic images were obtained of the lower lumbar spine. These images demonstrate surgical anterior and interbody fusion of L4-5 with good alignment of vertebral bodies.  IMPRESSION: Surgical anterior and interbody fusion of L4-5.   Electronically Signed   By: Sabino Dick M.D.   On: 08/29/2014 14:11   Dg Chest Port 1 View  09/02/2014   CLINICAL DATA:  Respiratory distress, history hypertension  EXAM: PORTABLE CHEST - 1 VIEW  COMPARISON:  Portable exam 0522 hr compared to 08/31/2014  FINDINGS: LEFT jugular central venous catheter tip projecting over SVC and confluence of azygos vein.  Enlargement of cardiac silhouette.  Mediastinal contours and pulmonary vascularity normal.  Lungs clear.  No pleural effusion, pneumothorax or acute osseous findings.  IMPRESSION: No acute abnormalities.  Enlargement of cardiac silhouette.   Electronically Signed   By: Lavonia Dana M.D.   On: 09/02/2014 08:07   Dg Chest Port 1 View  08/31/2014   CLINICAL DATA:  CVC scratch has Central line placement.  EXAM: PORTABLE CHEST - 1 VIEW  COMPARISON:  08/31/2014  FINDINGS: Left central line is in place. The tip projects lateral to the mediastinal contour and may be within the azygos vein. No pneumothorax. Bibasilar atelectasis. Heart is normal size. No effusions or acute bony  abnormality.  IMPRESSION: Left central line tip possibly within the azygos vein. No pneumothorax.  Bibasilar atelectasis.   Electronically Signed   By: Rolm Baptise M.D.   On: 08/31/2014 11:58   Dg Chest Port 1 View  08/31/2014   CLINICAL DATA:  Fever.  Evaluate for pneumonia.  EXAM: PORTABLE CHEST - 1 VIEW  COMPARISON:  08/21/2014  FINDINGS: Heart is upper limits normal in size. Linear densities in the lingula and left base, likely atelectasis. No confluent opacity on the right. No effusions or acute bony abnormality.  IMPRESSION: Left basilar linear densities, likely atelectasis.   Electronically Signed   By: Rolm Baptise M.D.   On: 08/31/2014 09:34   Dg C-arm 1-60 Min  08/30/2014   CLINICAL DATA:  L4-L5 posterior fusion.  Operative imaging.  EXAM: DG C-ARM 61-120 MIN; LUMBAR SPINE - 2-3 VIEW  COMPARISON:  Lumbar MRI, 02/03/2014.  FINDINGS: Submitted images show an anterior fusion plate and screws extending from L4-L5 and posterior left pedicle screws and interconnecting rod also extending from L4-L5. Radiolucent disc spacer maintains disc height, well-centered. No acute fracture or evidence of an operative complication.  IMPRESSION: Operative images from a L4-L5 fusion as described.   Electronically Signed   By: Lajean Manes M.D.   On: 08/30/2014 10:24   Dg C-arm 1-60 Min  08/29/2014   CLINICAL DATA:  L4-5 interbody fusion.  EXAM: LUMBAR SPINE - 2-3 VIEW; DG C-ARM 61-120 MIN  COMPARISON:  MRI scan of February 03, 2014.  FINDINGS: Two intraoperative fluoroscopic images were obtained of the lower lumbar spine. These images demonstrate surgical anterior and interbody fusion of L4-5 with good alignment of vertebral bodies.  IMPRESSION: Surgical anterior and interbody fusion of L4-5.   Electronically Signed   By: Sabino Dick M.D.   On: 08/29/2014 14:11   Dg Or Local Abdomen  08/29/2014   CLINICAL DATA:  Interoperative foreign body count  EXAM: OR LOCAL ABDOMEN  COMPARISON:  11/28/2009  FINDINGS: Bowel  gas pattern is nonobstructive. Fusion hardware is present at the L4-5 level compatible patient's ALIF. There are a few surgical clips at this level. Otherwise, no evidence of foreign body.  IMPRESSION: Postsurgical change compatible with ALIF.  No foreign body.  These results were called by telephone at the time of interpretation on 08/29/2014 at 12:58 pm to OR room 5.   Electronically Signed   By: Marin Olp M.D.   On: 08/29/2014 12:59    ASSESSMENT/PLAN:  Anemia, acute blood loss - start ferrous sulfate 325 mg 1 tab by mouth twice a day; CBC and BMP in one week  CPT CODE: 73710  MEDINA-VARGAS,MONINA, [place]

## 2014-09-19 ENCOUNTER — Ambulatory Visit: Payer: Medicare Other | Admitting: Cardiology

## 2014-09-27 ENCOUNTER — Other Ambulatory Visit: Payer: Self-pay | Admitting: Orthopedic Surgery

## 2014-09-27 DIAGNOSIS — M4626 Osteomyelitis of vertebra, lumbar region: Secondary | ICD-10-CM

## 2014-09-27 DIAGNOSIS — M869 Osteomyelitis, unspecified: Principal | ICD-10-CM

## 2014-09-28 ENCOUNTER — Ambulatory Visit
Admission: RE | Admit: 2014-09-28 | Discharge: 2014-09-28 | Disposition: A | Discharge status: Home / Self Care | Payer: Medicare Other | Source: Ambulatory Visit | Attending: Orthopedic Surgery | Admitting: Orthopedic Surgery

## 2014-09-28 ENCOUNTER — Other Ambulatory Visit: Payer: Self-pay | Admitting: Orthopedic Surgery

## 2014-09-28 DIAGNOSIS — B999 Unspecified infectious disease: Secondary | ICD-10-CM

## 2014-10-08 ENCOUNTER — Ambulatory Visit: Payer: Medicare Other | Admitting: Cardiology

## 2014-10-26 ENCOUNTER — Emergency Department (HOSPITAL_COMMUNITY)
Admission: EM | Admit: 2014-10-26 | Discharge: 2014-10-26 | Disposition: A | Discharge status: Home / Self Care | Payer: Medicare Other | Attending: Emergency Medicine | Admitting: Emergency Medicine

## 2014-10-26 ENCOUNTER — Emergency Department (HOSPITAL_COMMUNITY): Payer: Medicare Other

## 2014-10-26 DIAGNOSIS — M1990 Unspecified osteoarthritis, unspecified site: Secondary | ICD-10-CM | POA: Diagnosis not present

## 2014-10-26 DIAGNOSIS — R062 Wheezing: Secondary | ICD-10-CM | POA: Diagnosis not present

## 2014-10-26 DIAGNOSIS — R0600 Dyspnea, unspecified: Secondary | ICD-10-CM | POA: Diagnosis not present

## 2014-10-26 DIAGNOSIS — Z7982 Long term (current) use of aspirin: Secondary | ICD-10-CM | POA: Diagnosis not present

## 2014-10-26 DIAGNOSIS — R0602 Shortness of breath: Secondary | ICD-10-CM | POA: Diagnosis present

## 2014-10-26 DIAGNOSIS — J069 Acute upper respiratory infection, unspecified: Secondary | ICD-10-CM

## 2014-10-26 DIAGNOSIS — Z720 Tobacco use: Secondary | ICD-10-CM | POA: Diagnosis not present

## 2014-10-26 DIAGNOSIS — I10 Essential (primary) hypertension: Secondary | ICD-10-CM | POA: Insufficient documentation

## 2014-10-26 LAB — CBC WITH DIFFERENTIAL/PLATELET
Basophils Absolute: 0 10*3/uL (ref 0.0–0.1)
Basophils Relative: 0 % (ref 0–1)
Eosinophils Absolute: 0.2 10*3/uL (ref 0.0–0.7)
Eosinophils Relative: 2 % (ref 0–5)
HCT: 25.7 % — ABNORMAL LOW (ref 36.0–46.0)
Hemoglobin: 8.7 g/dL — ABNORMAL LOW (ref 12.0–15.0)
Lymphocytes Relative: 31 % (ref 12–46)
Lymphs Abs: 3.5 10*3/uL (ref 0.7–4.0)
MCH: 29.3 pg (ref 26.0–34.0)
MCHC: 33.9 g/dL (ref 30.0–36.0)
MCV: 86.5 fL (ref 78.0–100.0)
Monocytes Absolute: 0.5 10*3/uL (ref 0.1–1.0)
Monocytes Relative: 4 % (ref 3–12)
Neutro Abs: 7.1 10*3/uL (ref 1.7–7.7)
Neutrophils Relative %: 63 % (ref 43–77)
Platelets: 225 10*3/uL (ref 150–400)
RBC: 2.97 MIL/uL — ABNORMAL LOW (ref 3.87–5.11)
RDW: 21.3 % — ABNORMAL HIGH (ref 11.5–15.5)
WBC: 11.3 10*3/uL — ABNORMAL HIGH (ref 4.0–10.5)

## 2014-10-26 LAB — BASIC METABOLIC PANEL
Anion gap: 13 (ref 5–15)
BUN: 6 mg/dL (ref 6–23)
CO2: 26 mEq/L (ref 19–32)
Calcium: 9.3 mg/dL (ref 8.4–10.5)
Chloride: 102 mEq/L (ref 96–112)
Creatinine, Ser: 0.61 mg/dL (ref 0.50–1.10)
GFR calc Af Amer: >90 mL/min (ref >90)
GFR calc non Af Amer: >90 mL/min (ref >90)
Glucose, Bld: 131 mg/dL — ABNORMAL HIGH (ref 70–99)
Potassium: 3.2 mEq/L — ABNORMAL LOW (ref 3.7–5.3)
Sodium: 141 mEq/L (ref 137–147)

## 2014-10-26 LAB — PRO B NATRIURETIC PEPTIDE: Pro B Natriuretic peptide (BNP): 1237 pg/mL — ABNORMAL HIGH (ref 0–125)

## 2014-10-26 LAB — I-STAT TROPONIN, ED: Troponin i, poc: 0.02 ng/mL (ref 0.00–0.08)

## 2014-10-26 MED ORDER — ALBUTEROL (5 MG/ML) CONTINUOUS INHALATION SOLN
15.0000 mg/h | INHALATION_SOLUTION | RESPIRATORY_TRACT | Status: DC
  Administered 2014-10-26: 15 mg/h via RESPIRATORY_TRACT
  Filled 2014-10-26: qty 20

## 2014-10-26 MED ORDER — PREDNISONE 20 MG PO TABS: 60.0000 mg | ORAL_TABLET | Freq: Every day | ORAL | Status: DC

## 2014-10-26 MED ORDER — AZITHROMYCIN 250 MG PO TABS: 250.0000 mg | ORAL_TABLET | Freq: Every day | ORAL | Status: DC

## 2014-10-26 MED ORDER — ALBUTEROL SULFATE HFA 108 (90 BASE) MCG/ACT IN AERS
4.0000 | INHALATION_SPRAY | Freq: Once | RESPIRATORY_TRACT | Status: DC
  Filled 2014-10-26: qty 6.7

## 2014-10-26 MED ORDER — ACETAMINOPHEN 325 MG PO TABS
650.0000 mg | ORAL_TABLET | Freq: Once | ORAL | Status: AC
  Administered 2014-10-26: 650 mg via ORAL
  Filled 2014-10-26: qty 2

## 2014-10-26 NOTE — ED Notes (Signed)
MD at bedside. Dr. Ward at bedside. 

## 2014-10-26 NOTE — ED Provider Notes (Signed)
TIME SEEN: 8:35 PM  CHIEF COMPLAINT: Shortness of breath, cough  HPI: Pt is a 68 y.o. F with history of hypertension, recent L4-L5 fusion by Dr. Lynann Bologna September 2015 who presents to the emergency department with shortness of breath, cough that started this morning. She denies any fevers or chills but has had a productive cough. She states she feels like she is getting a cold. She took Mucinex DM without any relief. She states this afternoon she began wheezing and felt very short of breath. She is a smoker but has no history of asthma or COPD. Does not wear oxygen at home. No sick contacts. Has had an influenza vaccination and pneumonia vaccination this year. No lower extremity swelling or pain. No prior history of PE or DVT. She is currently on antibiotics for a wound infection over her surgical incision that she reports is improving.  ROS: See HPI Constitutional: no fever  Eyes: no drainage  ENT: no runny nose   Cardiovascular:  no chest pain  Resp: no SOB  GI: no vomiting GU: no dysuria Integumentary: no rash  Allergy: no hives  Musculoskeletal: no leg swelling  Neurological: no slurred speech ROS otherwise negative  PAST MEDICAL HISTORY/PAST SURGICAL HISTORY:  Past Medical History  Diagnosis Date  . Hypertension   . Arthritis   . Gout     MEDICATIONS:  Prior to Admission medications   Medication Sig Start Date End Date Taking? Authorizing Provider  aspirin EC 81 MG EC tablet Take 1 tablet (81 mg total) by mouth daily. 09/04/14   Shanker [name], MD  atorvastatin (LIPITOR) 40 MG tablet Take 1 tablet (40 mg total) by mouth daily at 6 PM. 09/04/14   Shanker [name], MD  cefpodoxime (VANTIN) 100 MG tablet Take 1 tablet (100 mg total) by mouth every 12 (twelve) hours. STOP DATE 09/06/14 09/04/14   Jonetta Osgood, MD  colchicine 0.6 MG tablet Take 0.6 mg by mouth daily as needed. For gout    Historical Provider, MD  gabapentin (NEURONTIN) 600 MG tablet Take 600 mg by mouth 3  (three) times daily.    Historical Provider, MD  OxyCODONE HCl ER 30 MG T12A Take 30 mg by mouth every 12 (twelve) hours. 09/04/14   Shanker [name], MD  oxyCODONE-acetaminophen (PERCOCET/ROXICET) 5-325 MG per tablet Take 1-2 tablets by mouth every 4 (four) hours as needed for moderate pain. 09/04/14   Shanker [name], MD  polyethylene glycol (MIRALAX / GLYCOLAX) packet Take 17 g by mouth daily. 09/04/14   Shanker [name], MD  senna-docusate (SENOKOT-S) 8.6-50 MG per tablet Take 2 tablets by mouth 2 (two) times daily. 09/04/14   Shanker [name], MD    ALLERGIES:  Allergies  Allergen Reactions  . Food Allergy Formula Other (See Comments)    Shellfish-banana-beef-flares up her gout    SOCIAL HISTORY:  History  Substance Use Topics  . Smoking status: Current Some Day Smoker -- 0.25 packs/day for 5 years    Types: Cigarettes  . Smokeless tobacco: Never Used  . Alcohol Use: No    FAMILY HISTORY: Family History  Problem Relation Age of Onset  . Varicose Veins Mother     EXAM: BP 138/68 mmHg  Pulse 103  Temp(Src) 99 F (37.2 C) (Oral)  Resp 20  SpO2 100% CONSTITUTIONAL: Alert and oriented and responds appropriately to questions. Well-appearing; well-nourished, morbidly obese HEAD: Normocephalic EYES: Conjunctivae clear, PERRL ENT: normal nose; no rhinorrhea; moist mucous membranes; pharynx without lesions noted  NECK: Supple, no meningismus, no LAD  CARD: Regular and tachycardic; S1 and S2 appreciated; no murmurs, no clicks, no rubs, no gallops RESP: Normal chest excursion without splinting; patient is mildly tachypneic; breath sounds equal bilaterally with patient does have diffuse rhonchorous breath sounds and is for wheezing, no Rales, speaking short sentences but no respiratory distress, no increased work of breathing ABD/GI: Normal bowel sounds; non-distended; soft, non-tender, no rebound, no guarding BACK:  The back appears normal and is non-tender to palpation, there is  no CVA tenderness; patient has 2 healing scarred areas to her lower lumbar spine with no surrounding erythema or warmth but do have some small areas that are open without drainage, no induration or fluctuance EXT: Normal ROM in all joints; non-tender to palpation; no edema; normal capillary refill; no cyanosis    SKIN: Normal color for age and race; warm NEURO: Moves all extremities equally PSYCH: The patient's mood and manner are appropriate. Grooming and personal hygiene are appropriate.  MEDICAL DECISION MAKING: Pt here with wheezing, rhonchi, several breath, cough. Concern for possible pneumonia versus URI in setting of tobacco abuse.  Will obtain labs, chest x-ray. We'll give continuous albuterol 15mg /hr. She has received 2 duo nebs and 125 mg IV Solu-Medrol with EMS with some improvement.  ED PROGRESS: Pt's breath sounds have improved with continuous albuterol. Chest x-ray clear. Labs unremarkable including negative troponin. EKG is nonischemic. We will ambulate patient with pulse ox after continuous nebulizer treatment has finished.  10:48 PM  Pt's lungs are clear to auscultation. She was able to ambulate and her O2 sat never dropped below 95%. She reports feeling much better. We'll discharge her with albuterol inhaler, prescriptions for azithromycin given she is a smoker and prednisone. Discussed return precautions and transported care instructions. She verbalized understanding and is comfortable with plan.   CRITICAL CARE Performed by: Nyra Jabs   Total critical care time: 35 minutes  Critical care time was exclusive of separately billable procedures and treating other patients.  Critical care was necessary to treat or prevent imminent or life-threatening deterioration.  Critical care was time spent personally by me on the following activities: development of treatment plan with patient and/or surrogate as well as nursing, discussions with consultants, evaluation of patient's  response to treatment, examination of patient, obtaining history from patient or surrogate, ordering and performing treatments and interventions, ordering and review of laboratory studies, ordering and review of radiographic studies, pulse oximetry and re-evaluation of patient's condition.     EKG Interpretation  Date/Time:  Friday October 26 2014 21:42:39 EST Ventricular Rate:  103 PR Interval:    QRS Duration: 95 QT Interval:  469 QTC Calculation: 614 R Axis:   87 Text Interpretation:  Sinus tachycardia Borderline right axis deviation Nonspecific T abnrm, anterolateral leads Prolonged QT interval No significant change since last tracing Confirmed by WARD,  DO, KRISTEN [phone]) on 10/26/2014 9:52:47 PM        [place], DO 10/26/14 [id number]

## 2014-10-26 NOTE — Discharge Instructions (Signed)
How to Use an Inhaler Proper inhaler technique is very important. Good technique ensures that the medicine reaches the lungs. Poor technique results in depositing the medicine on the tongue and back of the throat rather than in the airways. If you do not use the inhaler with good technique, the medicine will not help you. STEPS TO FOLLOW IF USING AN INHALER WITHOUT AN EXTENSION TUBE  Remove the cap from the inhaler.  If you are using the inhaler for the first time, you will need to prime it. Shake the inhaler for 5 seconds and release four puffs into the air, away from your face. Ask your health care provider or pharmacist if you have questions about priming your inhaler.  Shake the inhaler for 5 seconds before each breath in (inhalation).  Position the inhaler so that the top of the canister faces up.  Put your index finger on the top of the medicine canister. Your thumb supports the bottom of the inhaler.  Open your mouth.  Either place the inhaler between your teeth and place your lips tightly around the mouthpiece, or hold the inhaler 1-2 inches away from your open mouth. If you are unsure of which technique to use, ask your health care provider.  Breathe out (exhale) normally and as completely as possible.  Press the canister down with your index finger to release the medicine.  At the same time as the canister is pressed, inhale deeply and slowly until your lungs are completely filled. This should take 4-6 seconds. Keep your tongue down.  Hold the medicine in your lungs for 5-10 seconds (10 seconds is best). This helps the medicine get into the small airways of your lungs.  Breathe out slowly, through pursed lips. Whistling is an example of pursed lips.  Wait at least 15-30 seconds between puffs. Continue with the above steps until you have taken the number of puffs your health care provider has ordered. Do not use the inhaler more than your health care provider tells  you.  Replace the cap on the inhaler.  Follow the directions from your health care provider or the inhaler insert for cleaning the inhaler. STEPS TO FOLLOW IF USING AN INHALER WITH AN EXTENSION (SPACER)  Remove the cap from the inhaler.  If you are using the inhaler for the first time, you will need to prime it. Shake the inhaler for 5 seconds and release four puffs into the air, away from your face. Ask your health care provider or pharmacist if you have questions about priming your inhaler.  Shake the inhaler for 5 seconds before each breath in (inhalation).  Place the open end of the spacer onto the mouthpiece of the inhaler.  Position the inhaler so that the top of the canister faces up and the spacer mouthpiece faces you.  Put your index finger on the top of the medicine canister. Your thumb supports the bottom of the inhaler and the spacer.  Breathe out (exhale) normally and as completely as possible.  Immediately after exhaling, place the spacer between your teeth and into your mouth. Close your lips tightly around the spacer.  Press the canister down with your index finger to release the medicine.  At the same time as the canister is pressed, inhale deeply and slowly until your lungs are completely filled. This should take 4-6 seconds. Keep your tongue down and out of the way.  Hold the medicine in your lungs for 5-10 seconds (10 seconds is best). This helps the  medicine get into the small airways of your lungs. Exhale.  Repeat inhaling deeply through the spacer mouthpiece. Again hold that breath for up to 10 seconds (10 seconds is best). Exhale slowly. If it is difficult to take this second deep breath through the spacer, breathe normally several times through the spacer. Remove the spacer from your mouth.  Wait at least 15-30 seconds between puffs. Continue with the above steps until you have taken the number of puffs your health care provider has ordered. Do not use the  inhaler more than your health care provider tells you.  Remove the spacer from the inhaler, and place the cap on the inhaler.  Follow the directions from your health care provider or the inhaler insert for cleaning the inhaler and spacer. If you are using different kinds of inhalers, use your quick relief medicine to open the airways 10-15 minutes before using a steroid if instructed to do so by your health care provider. If you are unsure which inhalers to use and the order of using them, ask your health care provider, nurse, or respiratory therapist. If you are using a steroid inhaler, always rinse your mouth with water after your last puff, then gargle and spit out the water. Do not swallow the water. AVOID:  Inhaling before or after starting the spray of medicine. It takes practice to coordinate your breathing with triggering the spray.  Inhaling through the nose (rather than the mouth) when triggering the spray. HOW TO DETERMINE IF YOUR INHALER IS FULL OR NEARLY EMPTY You cannot know when an inhaler is empty by shaking it. A few inhalers are now being made with dose counters. Ask your health care provider for a prescription that has a dose counter if you feel you need that extra help. If your inhaler does not have a counter, ask your health care provider to help you determine the date you need to refill your inhaler. Write the refill date on a calendar or your inhaler canister. Refill your inhaler 7-10 days before it runs out. Be sure to keep an adequate supply of medicine. This includes making sure it is not expired, and that you have a spare inhaler.  SEEK MEDICAL CARE IF:   Your symptoms are only partially relieved with your inhaler.  You are having trouble using your inhaler.  You have some increase in phlegm. SEEK IMMEDIATE MEDICAL CARE IF:   You feel little or no relief with your inhalers. You are still wheezing and are feeling shortness of breath or tightness in your chest or  both.  You have dizziness, headaches, or a fast heart rate.  You have chills, fever, or night sweats.  You have a noticeable increase in phlegm production, or there is blood in the phlegm. MAKE SURE YOU:   Understand these instructions.  Will watch your condition.  Will get help right away if you are not doing well or get worse. Document Released: 11/20/2000 Document Revised: 09/13/2013 Document Reviewed: 06/22/2013 [facility] Patient Information 2015 [place], [place]. This information is not intended to replace advice given to you by your health care provider. Make sure you discuss any questions you have with your health care provider.  Upper Respiratory Infection, Adult An upper respiratory infection (URI) is also sometimes known as the common cold. The upper respiratory tract includes the nose, sinuses, throat, trachea, and bronchi. Bronchi are the airways leading to the lungs. Most people improve within 1 week, but symptoms can last up to 2 weeks. A residual  cough may last even longer.  CAUSES Many different viruses can infect the tissues lining the upper respiratory tract. The tissues become irritated and inflamed and often become very moist. Mucus production is also common. A cold is contagious. You can easily spread the virus to others by oral contact. This includes kissing, sharing a glass, coughing, or sneezing. Touching your mouth or nose and then touching a surface, which is then touched by another person, can also spread the virus. SYMPTOMS  Symptoms typically develop 1 to 3 days after you come in contact with a cold virus. Symptoms vary from person to person. They may include:  Runny nose.  Sneezing.  Nasal congestion.  Sinus irritation.  Sore throat.  Loss of voice (laryngitis).  Cough.  Fatigue.  Muscle aches.  Loss of appetite.  Headache.  Low-grade fever. DIAGNOSIS  You might diagnose your own cold based on familiar symptoms, since most people get a cold  2 to 3 times a year. Your caregiver can confirm this based on your exam. Most importantly, your caregiver can check that your symptoms are not due to another disease such as strep throat, sinusitis, pneumonia, asthma, or epiglottitis. Blood tests, throat tests, and X-rays are not necessary to diagnose a common cold, but they may sometimes be helpful in excluding other more serious diseases. Your caregiver will decide if any further tests are required. RISKS AND COMPLICATIONS  You may be at risk for a more severe case of the common cold if you smoke cigarettes, have chronic heart disease (such as heart failure) or lung disease (such as asthma), or if you have a weakened immune system. The very young and very old are also at risk for more serious infections. Bacterial sinusitis, middle ear infections, and bacterial pneumonia can complicate the common cold. The common cold can worsen asthma and chronic obstructive pulmonary disease (COPD). Sometimes, these complications can require emergency medical care and may be life-threatening. PREVENTION  The best way to protect against getting a cold is to practice good hygiene. Avoid oral or hand contact with people with cold symptoms. Wash your hands often if contact occurs. There is no clear evidence that vitamin C, vitamin E, echinacea, or exercise reduces the chance of developing a cold. However, it is always recommended to get plenty of rest and practice good nutrition. TREATMENT  Treatment is directed at relieving symptoms. There is no cure. Antibiotics are not effective, because the infection is caused by a virus, not by bacteria. Treatment may include:  Increased fluid intake. Sports drinks offer valuable electrolytes, sugars, and fluids.  Breathing heated mist or steam (vaporizer or shower).  Eating chicken soup or other clear broths, and maintaining good nutrition.  Getting plenty of rest.  Using gargles or lozenges for comfort.  Controlling fevers  with ibuprofen or acetaminophen as directed by your caregiver.  Increasing usage of your inhaler if you have asthma. Zinc gel and zinc lozenges, taken in the first 24 hours of the common cold, can shorten the duration and lessen the severity of symptoms. Pain medicines may help with fever, muscle aches, and throat pain. A variety of non-prescription medicines are available to treat congestion and runny nose. Your caregiver can make recommendations and may suggest nasal or lung inhalers for other symptoms.  HOME CARE INSTRUCTIONS   Only take over-the-counter or prescription medicines for pain, discomfort, or fever as directed by your caregiver.  Use a warm mist humidifier or inhale steam from a shower to increase air moisture. This  may keep secretions moist and make it easier to breathe.  Drink enough water and fluids to keep your urine clear or pale yellow.  Rest as needed.  Return to work when your temperature has returned to normal or as your caregiver advises. You may need to stay home longer to avoid infecting others. You can also use a face mask and careful hand washing to prevent spread of the virus. SEEK MEDICAL CARE IF:   After the first few days, you feel you are getting worse rather than better.  You need your caregiver's advice about medicines to control symptoms.  You develop chills, worsening shortness of breath, or brown or red sputum. These may be signs of pneumonia.  You develop yellow or brown nasal discharge or pain in the face, especially when you bend forward. These may be signs of sinusitis.  You develop a fever, swollen neck glands, pain with swallowing, or white areas in the back of your throat. These may be signs of strep throat. SEEK IMMEDIATE MEDICAL CARE IF:   You have a fever.  You develop severe or persistent headache, ear pain, sinus pain, or chest pain.  You develop wheezing, a prolonged cough, cough up blood, or have a change in your usual mucus (if you  have chronic lung disease).  You develop sore muscles or a stiff neck. Document Released: 05/19/2001 Document Revised: 02/15/2012 Document Reviewed: 02/28/2014 [facility] Patient Information 2015 [place], [place]. This information is not intended to replace advice given to you by your health care provider. Make sure you discuss any questions you have with your health care provider.   Smoking Cessation Quitting smoking is important to your health and has many advantages. However, it is not always easy to quit since nicotine is a very addictive drug. Oftentimes, people try 3 times or more before being able to quit. This document explains the best ways for you to prepare to quit smoking. Quitting takes hard work and a lot of effort, but you can do it. ADVANTAGES OF QUITTING SMOKING  You will live longer, feel better, and live better.  Your body will feel the impact of quitting smoking almost immediately.  Within 20 minutes, blood pressure decreases. Your pulse returns to its normal level.  After 8 hours, carbon monoxide levels in the blood return to normal. Your oxygen level increases.  After 24 hours, the chance of having a heart attack starts to decrease. Your breath, hair, and body stop smelling like smoke.  After 48 hours, damaged nerve endings begin to recover. Your sense of taste and smell improve.  After 72 hours, the body is virtually free of nicotine. Your bronchial tubes relax and breathing becomes easier.  After 2 to 12 weeks, lungs can hold more air. Exercise becomes easier and circulation improves.  The risk of having a heart attack, stroke, cancer, or lung disease is greatly reduced.  After 1 year, the risk of coronary heart disease is cut in half.  After 5 years, the risk of stroke falls to the same as a nonsmoker.  After 10 years, the risk of lung cancer is cut in half and the risk of other cancers decreases significantly.  After 15 years, the risk of coronary heart  disease drops, usually to the level of a nonsmoker.  If you are pregnant, quitting smoking will improve your chances of having a healthy baby.  The people you live with, especially any children, will be healthier.  You will have extra money to spend on  things other than cigarettes. QUESTIONS TO THINK ABOUT BEFORE ATTEMPTING TO QUIT You may want to talk about your answers with your health care provider.  Why do you want to quit?  If you tried to quit in the past, what helped and what did not?  What will be the most difficult situations for you after you quit? How will you plan to handle them?  Who can help you through the tough times? Your family? Friends? A health care provider?  What pleasures do you get from smoking? What ways can you still get pleasure if you quit? Here are some questions to ask your health care provider:  How can you help me to be successful at quitting?  What medicine do you think would be best for me and how should I take it?  What should I do if I need more help?  What is smoking withdrawal like? How can I get information on withdrawal? GET READY  Set a quit date.  Change your environment by getting rid of all cigarettes, ashtrays, matches, and lighters in your home, car, or work. Do not let people smoke in your home.  Review your past attempts to quit. Think about what worked and what did not. GET SUPPORT AND ENCOURAGEMENT You have a better chance of being successful if you have help. You can get support in many ways.  Tell your family, friends, and coworkers that you are going to quit and need their support. Ask them not to smoke around you.  Get individual, group, or telephone counseling and support. Programs are available at [employer] and health centers. Call your local health department for information about programs in your area.  Spiritual beliefs and practices may help some smokers quit.  Download a "quit meter" on your computer to  keep track of quit statistics, such as how long you have gone without smoking, cigarettes not smoked, and money saved.  Get a self-help book about quitting smoking and staying off tobacco. [place] yourself from urges to smoke. Talk to someone, go for a walk, or occupy your time with a task.  Change your normal routine. Take a different route to work. Drink tea instead of coffee. Eat breakfast in a different place.  Reduce your stress. Take a hot bath, exercise, or read a book.  Plan something enjoyable to do every day. Reward yourself for not smoking.  Explore interactive web-based programs that specialize in helping you quit. GET MEDICINE AND USE IT CORRECTLY Medicines can help you stop smoking and decrease the urge to smoke. Combining medicine with the above behavioral methods and support can greatly increase your chances of successfully quitting smoking.  Nicotine replacement therapy helps deliver nicotine to your body without the negative effects and risks of smoking. Nicotine replacement therapy includes nicotine gum, lozenges, inhalers, nasal sprays, and skin patches. Some may be available over-the-counter and others require a prescription.  Antidepressant medicine helps people abstain from smoking, but how this works is unknown. This medicine is available by prescription.  Nicotinic receptor partial agonist medicine simulates the effect of nicotine in your brain. This medicine is available by prescription. Ask your health care provider for advice about which medicines to use and how to use them based on your health history. Your health care provider will tell you what side effects to look out for if you choose to be on a medicine or therapy. Carefully read the information on the package. Do not use any  other product containing nicotine while using a nicotine replacement product.  RELAPSE OR DIFFICULT SITUATIONS Most relapses occur within the first 3  months after quitting. Do not be discouraged if you start smoking again. Remember, most people try several times before finally quitting. You may have symptoms of withdrawal because your body is used to nicotine. You may crave cigarettes, be irritable, feel very hungry, cough often, get headaches, or have difficulty concentrating. The withdrawal symptoms are only temporary. They are strongest when you first quit, but they will go away within 10-14 days. To reduce the chances of relapse, try to:  Avoid drinking alcohol. Drinking lowers your chances of successfully quitting.  Reduce the amount of caffeine you consume. Once you quit smoking, the amount of caffeine in your body increases and can give you symptoms, such as a rapid heartbeat, sweating, and anxiety.  Avoid smokers because they can make you want to smoke.  Do not let weight gain distract you. Many smokers will gain weight when they quit, usually less than 10 pounds. Eat a healthy diet and stay active. You can always lose the weight gained after you quit.  Find ways to improve your mood other than smoking. FOR MORE INFORMATION  www.smokefree.gov  Document Released: 11/17/2001 Document Revised: 04/09/2014 Document Reviewed: 03/03/2012 [facility] Patient Information 2015 [place], [place]. This information is not intended to replace advice given to you by your health care provider. Make sure you discuss any questions you have with your health care provider.

## 2014-10-26 NOTE — ED Notes (Signed)
Pt BIB EMS. Pt has had SOB and cough since this morning. Worse since 1500 today. Had lumbar surgery 2 months ago. Given Alb/atrovent x 2 by EMS PTA. Also given Solumedrol 125mg  IVP PTA. EMS states lung sounds have improved since having breathing tx. Pt alert, no acute distress. Skin warm, dry.

## 2014-10-26 NOTE — ED Notes (Signed)
Bed: WP79 Expected date:  Expected time:  Means of arrival:  Comments: EMS 68 yo F non-healing wound to back, [facility]

## 2014-10-26 NOTE — ED Notes (Signed)
Pt ambulated in hall HR=116 SP02= 95-100, pt sitting in chair HR=110 SP02=95 Pt complained of being tired and weak Rn and MD notified

## 2014-10-26 NOTE — ED Notes (Signed)
RT called for neb tx.

## 2014-12-25 ENCOUNTER — Other Ambulatory Visit (HOSPITAL_COMMUNITY)
Admission: RE | Admit: 2014-12-25 | Discharge: 2014-12-25 | Disposition: A | Discharge status: Home / Self Care | Payer: Medicare Other | Source: Ambulatory Visit | Attending: Family Medicine | Admitting: Family Medicine

## 2014-12-25 DIAGNOSIS — Z1151 Encounter for screening for human papillomavirus (HPV): Secondary | ICD-10-CM | POA: Diagnosis present

## 2014-12-25 DIAGNOSIS — Z124 Encounter for screening for malignant neoplasm of cervix: Secondary | ICD-10-CM | POA: Diagnosis present

## 2015-01-29 ENCOUNTER — Other Ambulatory Visit: Payer: Self-pay | Admitting: Orthopedic Surgery

## 2015-02-11 ENCOUNTER — Other Ambulatory Visit (HOSPITAL_COMMUNITY): Payer: Self-pay | Admitting: *Deleted

## 2015-02-11 ENCOUNTER — Encounter (HOSPITAL_COMMUNITY)
Admission: RE | Admit: 2015-02-11 | Discharge: 2015-02-11 | Disposition: A | Discharge status: Home / Self Care | Payer: Medicare Other | Source: Ambulatory Visit | Attending: Orthopedic Surgery | Admitting: Orthopedic Surgery

## 2015-02-11 ENCOUNTER — Encounter (HOSPITAL_COMMUNITY): Payer: Self-pay

## 2015-02-11 DIAGNOSIS — Z01818 Encounter for other preprocedural examination: Secondary | ICD-10-CM | POA: Diagnosis present

## 2015-02-11 DIAGNOSIS — M1712 Unilateral primary osteoarthritis, left knee: Secondary | ICD-10-CM | POA: Insufficient documentation

## 2015-02-11 HISTORY — DX: Gastro-esophageal reflux disease without esophagitis: K21.9

## 2015-02-11 HISTORY — DX: Pneumonia, unspecified organism: J18.9

## 2015-02-11 HISTORY — DX: Radiculopathy, site unspecified: M54.10

## 2015-02-11 HISTORY — DX: Polyneuropathy, unspecified: G62.9

## 2015-02-11 HISTORY — DX: Sleep apnea, unspecified: G47.30

## 2015-02-11 HISTORY — DX: Xerosis cutis: L85.3

## 2015-02-11 HISTORY — DX: Personal history of other diseases of urinary system: Z87.448

## 2015-02-11 HISTORY — DX: Cardiac murmur, unspecified: R01.1

## 2015-02-11 LAB — COMPREHENSIVE METABOLIC PANEL
ALT: 23 U/L (ref 0–35)
AST: 34 U/L (ref 0–37)
Albumin: 3.5 g/dL (ref 3.5–5.2)
Alkaline Phosphatase: 132 U/L — ABNORMAL HIGH (ref 39–117)
Anion gap: 8 (ref 5–15)
BUN: 11 mg/dL (ref 6–23)
CO2: 31 mmol/L (ref 19–32)
Calcium: 9 mg/dL (ref 8.4–10.5)
Chloride: 103 mmol/L (ref 96–112)
Creatinine, Ser: 0.77 mg/dL (ref 0.50–1.10)
GFR calc Af Amer: >90 mL/min (ref >90)
GFR calc non Af Amer: 84 mL/min — ABNORMAL LOW (ref >90)
Glucose, Bld: 96 mg/dL (ref 70–99)
Potassium: 3.4 mmol/L — ABNORMAL LOW (ref 3.5–5.1)
Sodium: 142 mmol/L (ref 135–145)
Total Bilirubin: 0.6 mg/dL (ref 0.3–1.2)
Total Protein: 7.1 g/dL (ref 6.0–8.3)

## 2015-02-11 LAB — URINALYSIS, ROUTINE W REFLEX MICROSCOPIC
Bilirubin Urine: NEGATIVE
Glucose, UA: NEGATIVE mg/dL
Hgb urine dipstick: NEGATIVE
Ketones, ur: NEGATIVE mg/dL
Leukocytes, UA: NEGATIVE
Nitrite: NEGATIVE
Protein, ur: NEGATIVE mg/dL
Specific Gravity, Urine: 1.013 (ref 1.005–1.030)
Urobilinogen, UA: 0.2 mg/dL (ref 0.0–1.0)
pH: 7.5 (ref 5.0–8.0)

## 2015-02-11 LAB — CBC WITH DIFFERENTIAL/PLATELET
Basophils Absolute: 0 10*3/uL (ref 0.0–0.1)
Basophils Relative: 0 % (ref 0–1)
Eosinophils Absolute: 0.2 10*3/uL (ref 0.0–0.7)
Eosinophils Relative: 3 % (ref 0–5)
HCT: 35.4 % — ABNORMAL LOW (ref 36.0–46.0)
Hemoglobin: 11.6 g/dL — ABNORMAL LOW (ref 12.0–15.0)
Lymphocytes Relative: 41 % (ref 12–46)
Lymphs Abs: 2.9 10*3/uL (ref 0.7–4.0)
MCH: 27.8 pg (ref 26.0–34.0)
MCHC: 32.8 g/dL (ref 30.0–36.0)
MCV: 84.9 fL (ref 78.0–100.0)
Monocytes Absolute: 0.6 10*3/uL (ref 0.1–1.0)
Monocytes Relative: 8 % (ref 3–12)
Neutro Abs: 3.4 10*3/uL (ref 1.7–7.7)
Neutrophils Relative %: 48 % (ref 43–77)
Platelets: 229 10*3/uL (ref 150–400)
RBC: 4.17 MIL/uL (ref 3.87–5.11)
RDW: 13.3 % (ref 11.5–15.5)
WBC: 7.2 10*3/uL (ref 4.0–10.5)

## 2015-02-11 LAB — SURGICAL PCR SCREEN
MRSA, PCR: NEGATIVE
Staphylococcus aureus: NEGATIVE

## 2015-02-11 LAB — PROTIME-INR
INR: 0.97 (ref 0.00–1.49)
Prothrombin Time: 12.9 seconds (ref 11.6–15.2)

## 2015-02-11 LAB — APTT: aPTT: 35 seconds (ref 24–37)

## 2015-02-11 LAB — TYPE AND SCREEN
ABO/RH(D): A POS
Antibody Screen: NEGATIVE

## 2015-02-11 LAB — ABO/RH: ABO/RH(D): A POS

## 2015-02-11 NOTE — Pre-Procedure Instructions (Signed)
Ellen Henry  02/11/2015   Your procedure is scheduled on:  Friday, February 19, 2015 at 7:15 AM.   Report to [facility] Entrance "A" Admitting Office at 5:30 AM.   Call this number if you have problems the morning of surgery: [phone]               Any questions prior to day of surgery, please call [phone] between 8 & 4 PM.   Remember:   Do not eat food or drink liquids after midnight Thursday, 02/14/15.   Take these medicines the morning of surgery with A SIP OF WATER: Gabapentin (Neurontin), Hydrocodone - if needed  Stop Aspirin as of today if you've not already done so.   Do not wear jewelry, make-up or nail polish.  Do not wear lotions, powders, or perfumes. You may wear deodorant.  Do not shave 48 hours prior to surgery.   Do not bring valuables to the hospital.  [facility] is not responsible                  for any belongings or valuables.               Contacts, dentures or bridgework may not be worn into surgery.  Leave suitcase in the car. After surgery it may be brought to your room.  For patients admitted to the hospital, discharge time is determined by your                treatment team.               Special Instructions: [name] - Preparing for Surgery  Before surgery, you can play an important role.  Because skin is not sterile, your skin needs to be as free of germs as possible.  You can reduce the number of germs on you skin by washing with CHG (chlorahexidine gluconate) soap before surgery.  CHG is an antiseptic cleaner which kills germs and bonds with the skin to continue killing germs even after washing.  Please DO NOT use if you have an allergy to CHG or antibacterial soaps.  If your skin becomes reddened/irritated stop using the CHG and inform your nurse when you arrive at Short Stay.  Do not shave (including legs and underarms) for at least 48 hours prior to the first CHG shower.  You may shave your face.  Please follow these  instructions carefully:   1.  Shower with CHG Soap the night before surgery and the                                morning of Surgery.  2.  If you choose to wash your hair, wash your hair first as usual with your       normal shampoo.  3.  After you shampoo, rinse your hair and body thoroughly to remove the                      Shampoo.  4.  Use CHG as you would any other liquid soap.  You can apply chg directly       to the skin and wash gently with scrungie or a clean washcloth.  5.  Apply the CHG Soap to your body ONLY FROM THE NECK DOWN.        Do not use on open wounds or open sores.  Avoid contact with your  eyes, ears, mouth and genitals (private parts).  Wash genitals (private parts) with your normal soap.  6.  Wash thoroughly, paying special attention to the area where your surgery        will be performed.  7.  Thoroughly rinse your body with warm water from the neck down.  8.  DO NOT shower/wash with your normal soap after using and rinsing off       the CHG Soap.  9.  Pat yourself dry with a clean towel.            10.  Wear clean pajamas.            11.  Place clean sheets on your bed the night of your first shower and do not        sleep with pets.  Day of Surgery  Do not apply any lotions the morning of surgery.  Please wear clean clothes to the hospital.    Please read over the following fact sheets that you were given: Pain Booklet, Coughing and Deep Breathing, Blood Transfusion Information, MRSA Information and Surgical Site Infection Prevention

## 2015-02-11 NOTE — Pre-Procedure Instructions (Signed)
Tristine Langi  02/11/2015   Your procedure is scheduled on:  Friday, February 19, 2015 at 7:15 AM.   Report to [facility] Entrance "A" Admitting Office at 5:30 AM.   Call this number if you have problems the morning of surgery: [phone]               Any questions prior to day of surgery, please call [phone] between 8 & 4 PM.   Remember:   Do not eat food or drink liquids after midnight Thursday, 02/14/15.   Take these medicines the morning of surgery with A SIP OF WATER: Gabapentin (Neurontin), Hydrocodone - if needed   Do not wear jewelry, make-up or nail polish.  Do not wear lotions, powders, or perfumes. You may wear deodorant.  Do not shave 48 hours prior to surgery.   Do not bring valuables to the hospital.  The [facility] is not responsible                  for any belongings or valuables.               Contacts, dentures or bridgework may not be worn into surgery.  Leave suitcase in the car. After surgery it may be brought to your room.  For patients admitted to the hospital, discharge time is determined by your                treatment team.               Special Instructions: [place] - Preparing for Surgery  Before surgery, you can play an important role.  Because skin is not sterile, your skin needs to be as free of germs as possible.  You can reduce the number of germs on you skin by washing with CHG (chlorahexidine gluconate) soap before surgery.  CHG is an antiseptic cleaner which kills germs and bonds with the skin to continue killing germs even after washing.  Please DO NOT use if you have an allergy to CHG or antibacterial soaps.  If your skin becomes reddened/irritated stop using the CHG and inform your nurse when you arrive at Short Stay.  Do not shave (including legs and underarms) for at least 48 hours prior to the first CHG shower.  You may shave your face.  Please follow these instructions carefully:   1.  Shower with CHG Soap the night before  surgery and the                                morning of Surgery.  2.  If you choose to wash your hair, wash your hair first as usual with your       normal shampoo.  3.  After you shampoo, rinse your hair and body thoroughly to remove the                      Shampoo.  4.  Use CHG as you would any other liquid soap.  You can apply chg directly       to the skin and wash gently with scrungie or a clean washcloth.  5.  Apply the CHG Soap to your body ONLY FROM THE NECK DOWN.        Do not use on open wounds or open sores.  Avoid contact with your eyes, ears, mouth and genitals (private parts).  Wash genitals (private parts)  with your normal soap.  6.  Wash thoroughly, paying special attention to the area where your surgery        will be performed.  7.  Thoroughly rinse your body with warm water from the neck down.  8.  DO NOT shower/wash with your normal soap after using and rinsing off       the CHG Soap.  9.  Pat yourself dry with a clean towel.            10.  Wear clean pajamas.            11.  Place clean sheets on your bed the night of your first shower and do not        sleep with pets.  Day of Surgery  Do not apply any lotions the morning of surgery.  Please wear clean clothes to the hospital.    Please read over the following fact sheets that you were given: Pain Booklet, Coughing and Deep Breathing, Blood Transfusion Information, MRSA Information and Surgical Site Infection Prevention

## 2015-02-14 MED ORDER — CEFAZOLIN SODIUM-DEXTROSE 2-3 GM-% IV SOLR
2.0000 g | INTRAVENOUS | Status: AC
  Administered 2015-02-15: 2 g via INTRAVENOUS
  Filled 2015-02-14: qty 50

## 2015-02-15 ENCOUNTER — Inpatient Hospital Stay (HOSPITAL_COMMUNITY)
Admission: RE | Admit: 2015-02-15 | Discharge: 2015-02-18 | DRG: 470 | Disposition: A | Discharge status: Home Health | Payer: Medicare Other | Source: Ambulatory Visit | Attending: Orthopedic Surgery | Admitting: Orthopedic Surgery

## 2015-02-15 ENCOUNTER — Encounter (HOSPITAL_COMMUNITY)
Admission: RE | Disposition: A | Discharge status: Home Health | Payer: Self-pay | Source: Ambulatory Visit | Attending: Orthopedic Surgery

## 2015-02-15 ENCOUNTER — Encounter (HOSPITAL_COMMUNITY): Payer: Self-pay | Admitting: *Deleted

## 2015-02-15 ENCOUNTER — Inpatient Hospital Stay (HOSPITAL_COMMUNITY): Payer: Medicare Other | Admitting: Certified Registered Nurse Anesthetist

## 2015-02-15 DIAGNOSIS — M545 Low back pain: Secondary | ICD-10-CM | POA: Diagnosis present

## 2015-02-15 DIAGNOSIS — R339 Retention of urine, unspecified: Secondary | ICD-10-CM | POA: Diagnosis not present

## 2015-02-15 DIAGNOSIS — Z6841 Body Mass Index (BMI) 40.0 and over, adult: Secondary | ICD-10-CM | POA: Diagnosis not present

## 2015-02-15 DIAGNOSIS — K219 Gastro-esophageal reflux disease without esophagitis: Secondary | ICD-10-CM | POA: Diagnosis present

## 2015-02-15 DIAGNOSIS — M1712 Unilateral primary osteoarthritis, left knee: Principal | ICD-10-CM | POA: Diagnosis present

## 2015-02-15 DIAGNOSIS — I10 Essential (primary) hypertension: Secondary | ICD-10-CM | POA: Diagnosis present

## 2015-02-15 DIAGNOSIS — G8929 Other chronic pain: Secondary | ICD-10-CM | POA: Diagnosis present

## 2015-02-15 DIAGNOSIS — F17210 Nicotine dependence, cigarettes, uncomplicated: Secondary | ICD-10-CM | POA: Diagnosis present

## 2015-02-15 DIAGNOSIS — Z7982 Long term (current) use of aspirin: Secondary | ICD-10-CM

## 2015-02-15 DIAGNOSIS — M109 Gout, unspecified: Secondary | ICD-10-CM | POA: Diagnosis present

## 2015-02-15 DIAGNOSIS — M25562 Pain in left knee: Secondary | ICD-10-CM | POA: Diagnosis present

## 2015-02-15 DIAGNOSIS — M62838 Other muscle spasm: Secondary | ICD-10-CM | POA: Diagnosis present

## 2015-02-15 DIAGNOSIS — Z79899 Other long term (current) drug therapy: Secondary | ICD-10-CM | POA: Diagnosis not present

## 2015-02-15 HISTORY — PX: TOTAL KNEE ARTHROPLASTY: SHX125

## 2015-02-15 SURGERY — ARTHROPLASTY, KNEE, TOTAL
Anesthesia: General | Site: Knee | Laterality: Left

## 2015-02-15 MED ORDER — GLYCOPYRROLATE 0.2 MG/ML IJ SOLN
INTRAMUSCULAR | Status: DC | PRN
  Administered 2015-02-15: 0.6 mg via INTRAVENOUS

## 2015-02-15 MED ORDER — BUPIVACAINE LIPOSOME 1.3 % IJ SUSP
20.0000 mL | INTRAMUSCULAR | Status: DC
  Filled 2015-02-15: qty 20

## 2015-02-15 MED ORDER — KETAMINE HCL 10 MG/ML IJ SOLN
INTRAMUSCULAR | Status: DC | PRN
  Administered 2015-02-15: 20 mg via INTRAVENOUS
  Administered 2015-02-15: 40 mg via INTRAVENOUS

## 2015-02-15 MED ORDER — FLEET ENEMA 7-19 GM/118ML RE ENEM: 1.0000 | ENEMA | Freq: Once | RECTAL | Status: AC | PRN

## 2015-02-15 MED ORDER — METHOCARBAMOL 500 MG PO TABS: 500.0000 mg | ORAL_TABLET | Freq: Three times a day (TID) | ORAL | Status: DC | PRN

## 2015-02-15 MED ORDER — ROCURONIUM BROMIDE 100 MG/10ML IV SOLN
INTRAVENOUS | Status: DC | PRN
  Administered 2015-02-15: 40 mg via INTRAVENOUS

## 2015-02-15 MED ORDER — ONDANSETRON HCL 4 MG/2ML IJ SOLN: 4.0000 mg | Freq: Four times a day (QID) | INTRAMUSCULAR | Status: DC | PRN

## 2015-02-15 MED ORDER — OXYCODONE HCL 5 MG PO TABS
ORAL_TABLET | ORAL | Status: AC
  Filled 2015-02-15: qty 1

## 2015-02-15 MED ORDER — BISACODYL 10 MG RE SUPP: 10.0000 mg | Freq: Every day | RECTAL | Status: DC | PRN

## 2015-02-15 MED ORDER — METHOCARBAMOL 1000 MG/10ML IJ SOLN
500.0000 mg | Freq: Four times a day (QID) | INTRAVENOUS | Status: DC | PRN
  Administered 2015-02-15: 500 mg via INTRAVENOUS
  Filled 2015-02-15 (×2): qty 5

## 2015-02-15 MED ORDER — LIDOCAINE HCL (CARDIAC) 20 MG/ML IV SOLN
INTRAVENOUS | Status: DC | PRN
  Administered 2015-02-15: 60 mg via INTRAVENOUS

## 2015-02-15 MED ORDER — HYDROCHLOROTHIAZIDE 12.5 MG PO CAPS
12.5000 mg | ORAL_CAPSULE | Freq: Every day | ORAL | Status: DC
  Administered 2015-02-17: 12.5 mg via ORAL
  Filled 2015-02-15 (×4): qty 1

## 2015-02-15 MED ORDER — KETAMINE HCL 100 MG/ML IJ SOLN
INTRAMUSCULAR | Status: AC
  Filled 2015-02-15: qty 1

## 2015-02-15 MED ORDER — PROPOFOL 10 MG/ML IV BOLUS
INTRAVENOUS | Status: DC | PRN
  Administered 2015-02-15: 180 mg via INTRAVENOUS
  Administered 2015-02-15: 20 mg via INTRAVENOUS

## 2015-02-15 MED ORDER — LACTATED RINGERS IV SOLN
INTRAVENOUS | Status: DC | PRN
  Administered 2015-02-15 (×2): via INTRAVENOUS

## 2015-02-15 MED ORDER — OXYCODONE HCL 5 MG PO TABS
5.0000 mg | ORAL_TABLET | Freq: Once | ORAL | Status: AC | PRN
  Administered 2015-02-15: 5 mg via ORAL

## 2015-02-15 MED ORDER — ONDANSETRON HCL 4 MG/2ML IJ SOLN
INTRAMUSCULAR | Status: DC | PRN
  Administered 2015-02-15: 4 mg via INTRAVENOUS

## 2015-02-15 MED ORDER — OXYCODONE-ACETAMINOPHEN 5-325 MG PO TABS
1.0000 | ORAL_TABLET | ORAL | Status: DC | PRN
  Administered 2015-02-15 – 2015-02-18 (×16): 2 via ORAL
  Filled 2015-02-15 (×16): qty 2

## 2015-02-15 MED ORDER — HYDROMORPHONE HCL 1 MG/ML IJ SOLN
INTRAMUSCULAR | Status: AC
  Administered 2015-02-15: 0.5 mg via INTRAVENOUS
  Filled 2015-02-15: qty 1

## 2015-02-15 MED ORDER — KETOROLAC TROMETHAMINE 30 MG/ML IJ SOLN
30.0000 mg | Freq: Once | INTRAMUSCULAR | Status: AC
  Administered 2015-02-15: 30 mg via INTRAVENOUS
  Filled 2015-02-15: qty 1

## 2015-02-15 MED ORDER — OXYCODONE-ACETAMINOPHEN 5-325 MG PO TABS: 1.0000 | ORAL_TABLET | ORAL | Status: DC | PRN

## 2015-02-15 MED ORDER — FENTANYL CITRATE 0.05 MG/ML IJ SOLN
INTRAMUSCULAR | Status: AC
  Filled 2015-02-15: qty 5

## 2015-02-15 MED ORDER — GABAPENTIN 600 MG PO TABS
600.0000 mg | ORAL_TABLET | Freq: Three times a day (TID) | ORAL | Status: DC
  Administered 2015-02-15 – 2015-02-18 (×8): 600 mg via ORAL
  Filled 2015-02-15 (×11): qty 1

## 2015-02-15 MED ORDER — OXYCODONE HCL 5 MG/5ML PO SOLN: 5.0000 mg | Freq: Once | ORAL | Status: AC | PRN

## 2015-02-15 MED ORDER — DEXAMETHASONE SODIUM PHOSPHATE 4 MG/ML IJ SOLN
INTRAMUSCULAR | Status: DC | PRN
  Administered 2015-02-15: 4 mg via INTRAVENOUS

## 2015-02-15 MED ORDER — CEFAZOLIN SODIUM-DEXTROSE 2-3 GM-% IV SOLR
2.0000 g | Freq: Four times a day (QID) | INTRAVENOUS | Status: AC
Start: 2015-02-15 — End: 2015-02-15
  Administered 2015-02-15 (×2): 2 g via INTRAVENOUS
  Filled 2015-02-15 (×3): qty 50

## 2015-02-15 MED ORDER — PROMETHAZINE HCL 25 MG/ML IJ SOLN: 6.2500 mg | Freq: Four times a day (QID) | INTRAMUSCULAR | Status: DC | PRN

## 2015-02-15 MED ORDER — ASPIRIN EC 325 MG PO TBEC: 325.0000 mg | DELAYED_RELEASE_TABLET | Freq: Two times a day (BID) | ORAL | Status: DC

## 2015-02-15 MED ORDER — MIDAZOLAM HCL 5 MG/5ML IJ SOLN
INTRAMUSCULAR | Status: DC | PRN
  Administered 2015-02-15: 2 mg via INTRAVENOUS

## 2015-02-15 MED ORDER — POLYETHYLENE GLYCOL 3350 17 G PO PACK: 17.0000 g | PACK | Freq: Every day | ORAL | Status: DC | PRN

## 2015-02-15 MED ORDER — SODIUM CHLORIDE 0.9 % IV SOLN
INTRAVENOUS | Status: DC
  Administered 2015-02-15: 15:00:00 via INTRAVENOUS

## 2015-02-15 MED ORDER — HYDROMORPHONE HCL 1 MG/ML IJ SOLN
0.2500 mg | INTRAMUSCULAR | Status: DC | PRN
  Administered 2015-02-15 (×4): 0.5 mg via INTRAVENOUS

## 2015-02-15 MED ORDER — POTASSIUM CHLORIDE CRYS ER 20 MEQ PO TBCR
20.0000 meq | EXTENDED_RELEASE_TABLET | Freq: Every day | ORAL | Status: DC
  Administered 2015-02-15 – 2015-02-18 (×4): 20 meq via ORAL
  Filled 2015-02-15 (×4): qty 1

## 2015-02-15 MED ORDER — HYDROMORPHONE HCL 1 MG/ML IJ SOLN
0.5000 mg | INTRAMUSCULAR | Status: DC | PRN
  Administered 2015-02-15 (×2): 0.5 mg via INTRAVENOUS

## 2015-02-15 MED ORDER — BUPIVACAINE LIPOSOME 1.3 % IJ SUSP
INTRAMUSCULAR | Status: DC | PRN
  Administered 2015-02-15: 20 mL

## 2015-02-15 MED ORDER — ACETAMINOPHEN 10 MG/ML IV SOLN
1000.0000 mg | Freq: Once | INTRAVENOUS | Status: AC
  Administered 2015-02-15: 1000 mg via INTRAVENOUS

## 2015-02-15 MED ORDER — NEOSTIGMINE METHYLSULFATE 10 MG/10ML IV SOLN
INTRAVENOUS | Status: DC | PRN
  Administered 2015-02-15: 5 mg via INTRAVENOUS

## 2015-02-15 MED ORDER — FUROSEMIDE 40 MG PO TABS
40.0000 mg | ORAL_TABLET | Freq: Every day | ORAL | Status: DC
  Administered 2015-02-15 – 2015-02-18 (×4): 40 mg via ORAL
  Filled 2015-02-15 (×4): qty 1

## 2015-02-15 MED ORDER — FENTANYL CITRATE 0.05 MG/ML IJ SOLN
INTRAMUSCULAR | Status: DC | PRN
  Administered 2015-02-15: 50 ug via INTRAVENOUS
  Administered 2015-02-15: 100 ug via INTRAVENOUS
  Administered 2015-02-15 (×5): 50 ug via INTRAVENOUS
  Administered 2015-02-15: 100 ug via INTRAVENOUS

## 2015-02-15 MED ORDER — HYDROMORPHONE HCL 1 MG/ML IJ SOLN
0.5000 mg | INTRAMUSCULAR | Status: DC | PRN
Start: 2015-02-15 — End: 2015-02-18
  Administered 2015-02-15: 0.5 mg via INTRAVENOUS
  Administered 2015-02-15 – 2015-02-18 (×15): 1 mg via INTRAVENOUS
  Filled 2015-02-15 (×17): qty 1

## 2015-02-15 MED ORDER — 0.9 % SODIUM CHLORIDE (POUR BTL) OPTIME
TOPICAL | Status: DC | PRN
  Administered 2015-02-15: 1000 mL

## 2015-02-15 MED ORDER — DOCUSATE SODIUM 100 MG PO CAPS
100.0000 mg | ORAL_CAPSULE | Freq: Two times a day (BID) | ORAL | Status: DC
Start: 2015-02-15 — End: 2015-02-18
  Administered 2015-02-15 – 2015-02-18 (×6): 100 mg via ORAL
  Filled 2015-02-15 (×5): qty 1

## 2015-02-15 MED ORDER — MIDAZOLAM HCL 2 MG/2ML IJ SOLN
INTRAMUSCULAR | Status: AC
Start: 2015-02-15 — End: 2015-02-15
  Filled 2015-02-15: qty 2

## 2015-02-15 MED ORDER — ALUM & MAG HYDROXIDE-SIMETH 200-200-20 MG/5ML PO SUSP: 30.0000 mL | ORAL | Status: DC | PRN

## 2015-02-15 MED ORDER — METHOCARBAMOL 500 MG PO TABS
500.0000 mg | ORAL_TABLET | Freq: Four times a day (QID) | ORAL | Status: DC | PRN
  Administered 2015-02-15 – 2015-02-18 (×10): 500 mg via ORAL
  Filled 2015-02-15 (×11): qty 1

## 2015-02-15 MED ORDER — ASPIRIN EC 325 MG PO TBEC
325.0000 mg | DELAYED_RELEASE_TABLET | Freq: Two times a day (BID) | ORAL | Status: DC
  Administered 2015-02-15 – 2015-02-18 (×6): 325 mg via ORAL
  Filled 2015-02-15 (×7): qty 1

## 2015-02-15 MED ORDER — DIPHENHYDRAMINE HCL 12.5 MG/5ML PO ELIX: 12.5000 mg | ORAL_SOLUTION | ORAL | Status: DC | PRN

## 2015-02-15 MED ORDER — ACETAMINOPHEN 10 MG/ML IV SOLN
INTRAVENOUS | Status: AC
  Administered 2015-02-15: 1000 mg via INTRAVENOUS
  Filled 2015-02-15: qty 100

## 2015-02-15 MED ORDER — BUPIVACAINE HCL (PF) 0.5 % IJ SOLN
INTRAMUSCULAR | Status: DC | PRN
  Administered 2015-02-15: 20 mL

## 2015-02-15 MED ORDER — ACETAMINOPHEN 650 MG RE SUPP: 650.0000 mg | Freq: Four times a day (QID) | RECTAL | Status: DC | PRN

## 2015-02-15 MED ORDER — TRANEXAMIC ACID 100 MG/ML IV SOLN
1000.0000 mg | INTRAVENOUS | Status: AC
  Administered 2015-02-15: 1000 mg via INTRAVENOUS
  Filled 2015-02-15: qty 10

## 2015-02-15 MED ORDER — ACETAMINOPHEN 325 MG PO TABS
650.0000 mg | ORAL_TABLET | Freq: Four times a day (QID) | ORAL | Status: DC | PRN
  Administered 2015-02-17: 650 mg via ORAL
  Filled 2015-02-15 (×2): qty 2

## 2015-02-15 MED ORDER — OLMESARTAN-AMLODIPINE-HCTZ 20-5-12.5 MG PO TABS: 1.0000 | ORAL_TABLET | Freq: Every day | ORAL | Status: DC

## 2015-02-15 MED ORDER — ONDANSETRON HCL 4 MG PO TABS: 4.0000 mg | ORAL_TABLET | Freq: Four times a day (QID) | ORAL | Status: DC | PRN

## 2015-02-15 MED ORDER — KETOROLAC TROMETHAMINE 30 MG/ML IJ SOLN
INTRAMUSCULAR | Status: AC
  Administered 2015-02-15: 30 mg via INTRAVENOUS
  Filled 2015-02-15: qty 1

## 2015-02-15 MED ORDER — CHLORHEXIDINE GLUCONATE 4 % EX LIQD
60.0000 mL | Freq: Once | CUTANEOUS | Status: DC
  Filled 2015-02-15: qty 60

## 2015-02-15 MED ORDER — PROPOFOL 10 MG/ML IV BOLUS
INTRAVENOUS | Status: AC
Start: 2015-02-15 — End: 2015-02-15
  Filled 2015-02-15: qty 20

## 2015-02-15 MED ORDER — IRBESARTAN 150 MG PO TABS
150.0000 mg | ORAL_TABLET | Freq: Every day | ORAL | Status: DC
  Administered 2015-02-17: 150 mg via ORAL
  Filled 2015-02-15 (×4): qty 1

## 2015-02-15 MED ORDER — KETOROLAC TROMETHAMINE 15 MG/ML IJ SOLN
7.5000 mg | Freq: Three times a day (TID) | INTRAMUSCULAR | Status: AC
  Administered 2015-02-15 – 2015-02-16 (×4): 7.5 mg via INTRAVENOUS
  Filled 2015-02-15 (×7): qty 1

## 2015-02-15 MED ORDER — PROMETHAZINE HCL 25 MG/ML IJ SOLN: 6.2500 mg | INTRAMUSCULAR | Status: DC | PRN

## 2015-02-15 MED ORDER — ATORVASTATIN CALCIUM 40 MG PO TABS
40.0000 mg | ORAL_TABLET | Freq: Every day | ORAL | Status: DC
  Administered 2015-02-15 – 2015-02-17 (×3): 40 mg via ORAL
  Filled 2015-02-15 (×5): qty 1

## 2015-02-15 SURGICAL SUPPLY — 68 items
BANDAGE ESMARK 6X9 LF (GAUZE/BANDAGES/DRESSINGS) ×1 IMPLANT
BENZOIN TINCTURE PRP APPL 2/3 (GAUZE/BANDAGES/DRESSINGS) ×2 IMPLANT
BLADE SAGITTAL 25.0X1.19X90 (BLADE) ×2 IMPLANT
BLADE SAW SAG 90X13X1.27 (BLADE) ×2 IMPLANT
BNDG ELASTIC 6X10 VLCR STRL LF (GAUZE/BANDAGES/DRESSINGS) ×2 IMPLANT
BNDG ESMARK 6X9 LF (GAUZE/BANDAGES/DRESSINGS) ×2
BOWL SMART MIX CTS (DISPOSABLE) ×2 IMPLANT
CAP KNEE TOTAL 3 SIGMA ×2 IMPLANT
CEMENT HV SMART SET (Cement) ×4 IMPLANT
COVER SURGICAL LIGHT HANDLE (MISCELLANEOUS) ×2 IMPLANT
CUFF TOURNIQUET SINGLE 34IN LL (TOURNIQUET CUFF) IMPLANT
CUFF TOURNIQUET SINGLE 44IN (TOURNIQUET CUFF) ×2 IMPLANT
DRAPE EXTREMITY T 121X128X90 (DRAPE) ×2 IMPLANT
DRAPE IMP U-DRAPE 54X76 (DRAPES) ×2 IMPLANT
DRAPE U-SHAPE 47X51 STRL (DRAPES) ×2 IMPLANT
DRSG MEPILEX BORDER 4X12 (GAUZE/BANDAGES/DRESSINGS) ×2 IMPLANT
DRSG MEPILEX BORDER 4X8 (GAUZE/BANDAGES/DRESSINGS) ×2 IMPLANT
DRSG PAD ABDOMINAL 8X10 ST (GAUZE/BANDAGES/DRESSINGS) ×2 IMPLANT
DURAPREP 26ML APPLICATOR (WOUND CARE) ×2 IMPLANT
ELECT REM PT RETURN 9FT ADLT (ELECTROSURGICAL) ×2
ELECTRODE REM PT RTRN 9FT ADLT (ELECTROSURGICAL) ×1 IMPLANT
EVACUATOR 1/8 PVC DRAIN (DRAIN) ×2 IMPLANT
FACESHIELD WRAPAROUND (MASK) ×2 IMPLANT
GAUZE SPONGE 4X4 12PLY STRL (GAUZE/BANDAGES/DRESSINGS) ×2 IMPLANT
GLOVE BIOGEL PI IND STRL 7.0 (GLOVE) ×1 IMPLANT
GLOVE BIOGEL PI IND STRL 8 (GLOVE) ×2 IMPLANT
GLOVE BIOGEL PI INDICATOR 7.0 (GLOVE) ×1
GLOVE BIOGEL PI INDICATOR 8 (GLOVE) ×2
GLOVE ECLIPSE 6.5 STRL STRAW (GLOVE) ×2 IMPLANT
GLOVE ECLIPSE 7.5 STRL STRAW (GLOVE) ×4 IMPLANT
GLOVE SKINSENSE NS SZ7.0 (GLOVE) ×1
GLOVE SKINSENSE STRL SZ7.0 (GLOVE) ×1 IMPLANT
GLOVE SS BIOGEL STRL SZ 6.5 (GLOVE) ×1 IMPLANT
GLOVE SUPERSENSE BIOGEL SZ 6.5 (GLOVE) ×1
GOWN STRL REUS W/ TWL LRG LVL3 (GOWN DISPOSABLE) ×1 IMPLANT
GOWN STRL REUS W/ TWL XL LVL3 (GOWN DISPOSABLE) ×2 IMPLANT
GOWN STRL REUS W/TWL LRG LVL3 (GOWN DISPOSABLE) ×1
GOWN STRL REUS W/TWL XL LVL3 (GOWN DISPOSABLE) ×2
HANDPIECE INTERPULSE COAX TIP (DISPOSABLE) ×1
HOOD PEEL AWAY FACE SHEILD DIS (HOOD) ×6 IMPLANT
IMMOBILIZER KNEE 20 (SOFTGOODS) ×4 IMPLANT
IMMOBILIZER KNEE 20 THIGH 36 (SOFTGOODS) ×1 IMPLANT
IMMOBILIZER KNEE 22 UNIV (SOFTGOODS) IMPLANT
KIT BASIN OR (CUSTOM PROCEDURE TRAY) ×2 IMPLANT
KIT ROOM TURNOVER OR (KITS) ×2 IMPLANT
MANIFOLD NEPTUNE II (INSTRUMENTS) ×2 IMPLANT
NEEDLE SPNL 22GX3.5 QUINCKE BK (NEEDLE) ×2 IMPLANT
NS IRRIG 1000ML POUR BTL (IV SOLUTION) ×2 IMPLANT
PACK TOTAL JOINT (CUSTOM PROCEDURE TRAY) ×2 IMPLANT
PACK UNIVERSAL I (CUSTOM PROCEDURE TRAY) ×2 IMPLANT
PAD ARMBOARD 7.5X6 YLW CONV (MISCELLANEOUS) ×4 IMPLANT
PAD CAST 4YDX4 CTTN HI CHSV (CAST SUPPLIES) ×1 IMPLANT
PADDING CAST COTTON 4X4 STRL (CAST SUPPLIES) ×1
SET HNDPC FAN SPRY TIP SCT (DISPOSABLE) ×1 IMPLANT
STAPLER VISISTAT 35W (STAPLE) IMPLANT
STRIP CLOSURE SKIN 1/2X4 (GAUZE/BANDAGES/DRESSINGS) ×4 IMPLANT
SUCTION FRAZIER TIP 10 FR DISP (SUCTIONS) ×2 IMPLANT
SUT MNCRL AB 3-0 PS2 18 (SUTURE) IMPLANT
SUT VIC AB 0 CTB1 27 (SUTURE) ×4 IMPLANT
SUT VIC AB 1 CT1 27 (SUTURE) ×3
SUT VIC AB 1 CT1 27XBRD ANBCTR (SUTURE) ×3 IMPLANT
SUT VIC AB 2-0 CTB1 (SUTURE) ×4 IMPLANT
SYR 50ML LL SCALE MARK (SYRINGE) ×2 IMPLANT
TOWEL OR 17X24 6PK STRL BLUE (TOWEL DISPOSABLE) ×2 IMPLANT
TOWEL OR 17X26 10 PK STRL BLUE (TOWEL DISPOSABLE) ×2 IMPLANT
TRAY FOLEY CATH 16FRSI W/METER (SET/KITS/TRAYS/PACK) ×2 IMPLANT
TUBE CONNECTING 12X1/4 (SUCTIONS) ×2 IMPLANT
UPCHARGE REV TRAY MBT KNEE ×2 IMPLANT

## 2015-02-15 NOTE — Discharge Instructions (Signed)
Total Knee Replacement, Care After °Refer to this sheet in the next few weeks. These instructions provide you with information on caring for yourself after your procedure. Your health care provider also may give you specific instructions. Your treatment has been planned according to the most current medical practices, but problems sometimes occur. Call your health care provider if you have any problems or questions after your procedure. °HOME CARE INSTRUCTIONS  °· See a physical therapist as directed by your health care provider. °· Take medicines only as directed by your health care provider. °· Avoid lifting or driving until you are instructed otherwise. °· If you have been sent home with a continuous passive motion machine, use it as directed by your health care provider. °SEEK MEDICAL CARE IF: °· You have difficulty breathing. °· You have drainage, redness, swelling, or pain at your incision site. °· You have a bad smell coming from your incision site. °· You have persistent bleeding from your incision site. °· Your incision breaks open after sutures (stitches) or staples have been removed. °· You have a fever. °SEEK IMMEDIATE MEDICAL CARE IF:  °· You have a rash. °· You have pain or swelling in your calf or thigh. °· You have shortness of breath or chest pain. °· Your range of motion in your knee is decreasing rather than increasing. °MAKE SURE YOU:  °· Understand these instructions. °· Will watch your condition. °· Will get help right away if you are not doing well or get worse. °Document Released: 06/12/2005 Document Revised: 04/09/2014 Document Reviewed: 01/12/2012 °Exit[employer]® Patient Information ©2015 [employer]. This information is not intended to replace advice given to you by your health care provider. Make sure you discuss any questions you have with your health care provider. ° °

## 2015-02-15 NOTE — Transfer of Care (Signed)
Immediate Anesthesia Transfer of Care Note  Patient: Ellen Henry  Procedure(s) Performed: Procedure(s): TOTAL KNEE ARTHROPLASTY (Left)  Patient Location: PACU  Anesthesia Type:General  Level of Consciousness: awake, alert , oriented and patient cooperative  Airway & Oxygen Therapy: Patient Spontanous Breathing and Patient connected to face mask oxygen  Post-op Assessment: Report given to RN, Post -op Vital signs reviewed and stable, Patient moving all extremities and Patient moving all extremities X 4  Post vital signs: Reviewed and stable  Last Vitals:  Filed Vitals:   02/15/15 0550  BP: 119/76  Pulse:   Temp:   Resp:     Complications: No apparent anesthesia complications

## 2015-02-15 NOTE — H&P (Signed)
TOTAL KNEE ADMISSION H&P  Patient is being admitted for left total knee arthroplasty.  Subjective:  Chief Complaint:left knee pain.  HPI: Ellen Henry, 69 y.o. female, has a history of pain and functional disability in the left knee due to arthritis and has failed non-surgical conservative treatments for greater than 12 weeks to includeNSAID's and/or analgesics, corticosteriod injections, viscosupplementation injections, flexibility and strengthening excercises, supervised PT with diminished ADL's post treatment, weight reduction as appropriate and activity modification.  Onset of symptoms was gradual, starting 5 years ago with gradually worsening course since that time. The patient noted prior procedures on the knee to include  arthroscopy and menisectomy on the left knee(s).  Patient currently rates pain in the left knee(s) at 8 out of 10 with activity. Patient has night pain, worsening of pain with activity and weight bearing, pain that interferes with activities of daily living, pain with passive range of motion, crepitus and joint swelling.  Patient has evidence of subchondral cysts, subchondral sclerosis, periarticular osteophytes, joint subluxation and joint space narrowing by imaging studies. This patient has had failure of conservative care. There is no active infection.  There are no active problems to display for this patient.  Past Medical History  Diagnosis Date  . 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 Hypertension   . Sleep apnea     does not use cpap  . Pneumonia   . H/O pyelonephritis     as a child  . GERD (gastroesophageal reflux disease)     "years ago", diet controlled  . Arthritis   . Gout   . Neuropathy   . Dry skin   . Radiculopathy     Past Surgical History  Procedure Laterality Date  . Carpal tunnel release Right     x 2  . Back surgery  08/28/2014 and 08/29/2014    lumbar fusion  . Tonsillectomy    . Colonoscopy    . Appendectomy    .  Abdominal hysterectomy    . Joint replacement      right knee    Prescriptions prior to admission  Medication Sig Dispense Refill Last Dose  . atorvastatin (LIPITOR) 40 MG tablet Take 40 mg by mouth daily.  6 02/14/2015 at Unknown time  . furosemide (LASIX) 40 MG tablet Take 40 mg by mouth daily.   4 02/14/2015 at Unknown time  . gabapentin (NEURONTIN) 600 MG tablet Take 600 mg by mouth 3 (three) times daily.  6 02/14/2015 at Unknown time  . HYDROcodone-acetaminophen (NORCO/VICODIN) 5-325 MG per tablet Take 1 tablet by mouth every 6 (six) hours as needed. for pain  0 02/15/2015 at 0500  . KLOR-CON M20 20 MEQ tablet Take 20 mEq by mouth daily.  3 02/14/2015 at Unknown time  . meloxicam (MOBIC) 15 MG tablet Take 15 mg by mouth daily.   2 02/14/2015 at Unknown time  . OVER THE COUNTER MEDICATION      . TRIBENZOR 20-5-12.5 MG TABS Take 1 tablet by mouth daily.  4 02/14/2015 at Unknown time  . traMADol (ULTRAM) 50 MG tablet Take 50 mg by mouth every 6 (six) hours as needed.   0 Not Taking at Unknown time   No Known Allergies  History  Substance Use Topics  . Smoking status: Current Every Day Smoker -- 0.25 packs/day    Types: Cigarettes  . Smokeless tobacco: Never Used  . Alcohol Use: No    History reviewed. No pertinent family  history.   ROS ROS: I have reviewed the patient's review of systems thoroughly and there are no positive responses as relates to the HPI. Objective:  Physical Exam  Vital signs in last 24 hours: Temp:  [97.8 F (36.6 C)] 97.8 F (36.6 C) (03/11 0544) Pulse Rate:  [84] 84 (03/11 0544) Resp:  [18] 18 (03/11 0544) BP: (119)/(76) 119/76 mmHg (03/11 0550) SpO2:  [100 %] 100 % (03/11 0544) Weight:  [237 lb (107.502 kg)-239 lb (108.41 kg)] 237 lb (107.502 kg) (03/11 0548) Well-developed well-nourished patient in no acute distress. Alert and oriented x3 HEENT:within normal limits Cardiac: Regular rate and rhythm Pulmonary: Lungs clear to auscultation Abdomen: Soft  and nontender.  Normal active bowel sounds  Musculoskeletal: (L Knee: painful ROM// no instability//  Labs: Recent Results (from the past 2160 hour(s))  Surgical pcr screen     Status: None   Collection Time: 02/11/15 12:44 PM  Result Value Ref Range   MRSA, PCR NEGATIVE NEGATIVE   Staphylococcus aureus NEGATIVE NEGATIVE    Comment:        The Xpert SA Assay (FDA approved for NASAL specimens in patients over 40 years of age), is one component of a comprehensive surveillance program.  Test performance has been validated by [facility] for patients greater than or equal to 56 year old. It is not intended to diagnose infection nor to guide or monitor treatment.   Type and screen     Status: None   Collection Time: 02/11/15  1:00 PM  Result Value Ref Range   ABO/RH(D) A POS    Antibody Screen NEG    Sample Expiration 02/25/2015   ABO/Rh     Status: None   Collection Time: 02/11/15  1:00 PM  Result Value Ref Range   ABO/RH(D) A POS   APTT     Status: None   Collection Time: 02/11/15  1:02 PM  Result Value Ref Range   aPTT 35 24 - 37 seconds  CBC WITH DIFFERENTIAL     Status: Abnormal   Collection Time: 02/11/15  1:02 PM  Result Value Ref Range   WBC 7.2 4.0 - 10.5 K/uL   RBC 4.17 3.87 - 5.11 MIL/uL   Hemoglobin 11.6 (L) 12.0 - 15.0 g/dL   HCT 35.4 (L) 36.0 - 46.0 %   MCV 84.9 78.0 - 100.0 fL   MCH 27.8 26.0 - 34.0 pg   MCHC 32.8 30.0 - 36.0 g/dL   RDW 13.3 11.5 - 15.5 %   Platelets 229 150 - 400 K/uL   Neutrophils Relative % 48 43 - 77 %   Neutro Abs 3.4 1.7 - 7.7 K/uL   Lymphocytes Relative 41 12 - 46 %   Lymphs Abs 2.9 0.7 - 4.0 K/uL   Monocytes Relative 8 3 - 12 %   Monocytes Absolute 0.6 0.1 - 1.0 K/uL   Eosinophils Relative 3 0 - 5 %   Eosinophils Absolute 0.2 0.0 - 0.7 K/uL   Basophils Relative 0 0 - 1 %   Basophils Absolute 0.0 0.0 - 0.1 K/uL  Comprehensive metabolic panel     Status: Abnormal   Collection Time: 02/11/15  1:02 PM  Result Value Ref Range    Sodium 142 135 - 145 mmol/L   Potassium 3.4 (L) 3.5 - 5.1 mmol/L   Chloride 103 96 - 112 mmol/L   CO2 31 19 - 32 mmol/L   Glucose, Bld 96 70 - 99 mg/dL   BUN 11 6 -  23 mg/dL   Creatinine, Ser 0.77 0.50 - 1.10 mg/dL   Calcium 9.0 8.4 - 10.5 mg/dL   Total Protein 7.1 6.0 - 8.3 g/dL   Albumin 3.5 3.5 - 5.2 g/dL   AST 34 0 - 37 U/L   ALT 23 0 - 35 U/L   Alkaline Phosphatase 132 (H) 39 - 117 U/L   Total Bilirubin 0.6 0.3 - 1.2 mg/dL   GFR calc non Af Amer 84 (L) >90 mL/min   GFR calc Af Amer >90 >90 mL/min    Comment: (NOTE) The eGFR has been calculated using the CKD EPI equation. This calculation has not been validated in all clinical situations. eGFR's persistently <90 mL/min signify possible Chronic Kidney Disease.    Anion gap 8 5 - 15  Protime-INR     Status: None   Collection Time: 02/11/15  1:02 PM  Result Value Ref Range   Prothrombin Time 12.9 11.6 - 15.2 seconds   INR 0.97 0.00 - 1.49  Urinalysis, Routine w reflex microscopic     Status: None   Collection Time: 02/11/15  1:03 PM  Result Value Ref Range   Color, Urine YELLOW YELLOW   APPearance CLEAR CLEAR   Specific Gravity, Urine 1.013 1.005 - 1.030   pH 7.5 5.0 - 8.0   Glucose, UA NEGATIVE NEGATIVE mg/dL   Hgb urine dipstick NEGATIVE NEGATIVE   Bilirubin Urine NEGATIVE NEGATIVE   Ketones, ur NEGATIVE NEGATIVE mg/dL   Protein, ur NEGATIVE NEGATIVE mg/dL   Urobilinogen, UA 0.2 0.0 - 1.0 mg/dL   Nitrite NEGATIVE NEGATIVE   Leukocytes, UA NEGATIVE NEGATIVE    Comment: MICROSCOPIC NOT DONE ON URINES WITH NEGATIVE PROTEIN, BLOOD, LEUKOCYTES, NITRITE, OR GLUCOSE <1000 mg/dL.    Estimated body mass index is 46.29 kg/(m^2) as calculated from the following:   Height as of this encounter: 5' (1.524 m).   Weight as of this encounter: 237 lb (107.502 kg).   Imaging Review Plain radiographs demonstrate severe degenerative joint disease of the left knee(s). The overall alignment ismild varus. The bone quality appears to  be good for age and reported activity level.  Assessment/Plan:  End stage arthritis, left knee   The patient history, physical examination, clinical judgment of the provider and imaging studies are consistent with end stage degenerative joint disease of the left knee(s) and total knee arthroplasty is deemed medically necessary. The treatment options including medical management, injection therapy arthroscopy and arthroplasty were discussed at length. The risks and benefits of total knee arthroplasty were presented and reviewed. The risks due to aseptic loosening, infection, stiffness, patella tracking problems, thromboembolic complications and other imponderables were discussed. The patient acknowledged the explanation, agreed to proceed with the plan and consent was signed. Patient is being admitted for inpatient treatment for surgery, pain control, PT, OT, prophylactic antibiotics, VTE prophylaxis, progressive ambulation and ADL's and discharge planning. The patient is planning to be discharged to skilled nursing facility

## 2015-02-15 NOTE — Evaluation (Signed)
Physical Therapy Evaluation Patient Details Name: Ellen Henry MRN: [id number] DOB: 1946-09-12 Today's Date: 02/15/2015   History of Present Illness  Patient is a 69 yo female admitted 02/15/15 now s/p Lt TKA.  PMH:  HTN, OSA, arthritis, gout, neuropathy, radiculopathy, lumbar surgery 08/2014, Rt TKA  Clinical Impression  Patient presents with problems listed below.  Will benefit from acute PT to maximize independence prior to discharge home with family.    Follow Up Recommendations Home health PT;Supervision/Assistance - 24 hour    Equipment Recommendations  None recommended by PT    Recommendations for Other Services       Precautions / Restrictions Precautions Precautions: Knee Precaution Booklet Issued: Yes (comment) Precaution Comments: Reviewed precautions with patient Required Braces or Orthoses: Knee Immobilizer - Left Knee Immobilizer - Left: On when out of bed or walking Restrictions Weight Bearing Restrictions: Yes LLE Weight Bearing: Weight bearing as tolerated      Mobility  Bed Mobility Overal bed mobility: Needs Assistance Bed Mobility: Supine to Sit     Supine to sit: Min assist     General bed mobility comments: Instructed patient on donning KI on LLE.  Verbal cues for bed mobility technique.  Assist to move LLE off of bed and to raise trunk to sitting.  Once at EOB, patient able to maintain sitting balance.  Transfers Overall transfer level: Needs assistance Equipment used: Rolling walker (2 wheeled) Transfers: Sit to/from [facility] Sit to Stand: Min assist Stand pivot transfers: Min assist       General transfer comment: Verbal cues for hand placement and technique.  Assist to rise to standing and for balance initially.  Patient able to take several steps to pivot to chair.  Ambulation/Gait                Stairs            Wheelchair Mobility    Modified Rankin (Stroke Patients Only)       Balance                                             Pertinent Vitals/Pain Pain Assessment: 0-10 Pain Score: 6  Pain Location: Lt knee Pain Descriptors / Indicators: Aching;Sore Pain Intervention(s): Limited activity within patient's tolerance;Repositioned    Home Living Family/patient expects to be discharged to:: Private residence Living Arrangements: Children Available Help at Discharge: Family;Available 24 hours/day Type of Home: House Home Access: Stairs to enter Entrance Stairs-Rails: None Entrance Stairs-Number of Steps: 3 Home Layout: One level Home Equipment: Walker - 2 wheels;Cane - quad;Bedside commode      Prior Function Level of Independence: Independent with assistive device(s)         Comments: Per patient, using quad cane since back surgery     Hand Dominance        Extremity/Trunk Assessment   Upper Extremity Assessment: Overall WFL for tasks assessed           Lower Extremity Assessment: LLE deficits/detail   LLE Deficits / Details: Decreased strength and ROM post-op  Cervical / Trunk Assessment: Normal (recent spine surgery 08/2104)  Communication   Communication: No difficulties  Cognition Arousal/Alertness: Awake/alert Behavior During Therapy: WFL for tasks assessed/performed Overall Cognitive Status: Within Functional Limits for tasks assessed  General Comments      Exercises Total Joint Exercises Ankle Circles/Pumps: AROM;Both;10 reps;Seated      Assessment/Plan    PT Assessment Patient needs continued PT services  PT Diagnosis Difficulty walking;Acute pain   PT Problem List Decreased strength;Decreased range of motion;Decreased activity tolerance;Decreased balance;Decreased mobility;Decreased knowledge of use of DME;Decreased knowledge of precautions;Pain  PT Treatment Interventions DME instruction;Gait training;Stair training;Functional mobility training;Therapeutic activities;Therapeutic  exercise;Patient/family education   PT Goals (Current goals can be found in the Care Plan section) Acute Rehab PT Goals Patient Stated Goal: To go home PT Goal Formulation: With patient Time For Goal Achievement: 02/22/15 Potential to Achieve Goals: Good    Frequency 7X/week   Barriers to discharge        Co-evaluation               End of Session Equipment Utilized During Treatment: Gait belt;Left knee immobilizer;Oxygen Activity Tolerance: Patient limited by pain Patient left: in chair;with call bell/phone within reach Nurse Communication: Mobility status         Time: 5597-4163 PT Time Calculation (min) (ACUTE ONLY): 19 min   Charges:   PT Evaluation $Initial PT Evaluation Tier I: 1 Procedure     PT G CodesDespina Pole 2015/03/16, 5:20 PM [name], [place] Pager [phone]

## 2015-02-15 NOTE — Anesthesia Postprocedure Evaluation (Signed)
  Anesthesia Post-op Note  Patient: Ellen Henry  Procedure(s) Performed: Procedure(s): TOTAL KNEE ARTHROPLASTY (Left)  Patient Location: PACU  Anesthesia Type:General  Level of Consciousness: awake and alert   Airway and Oxygen Therapy: Patient Spontanous Breathing  Post-op Pain: mild  Post-op Assessment: Post-op Vital signs reviewed  Post-op Vital Signs: Reviewed  Last Vitals:  Filed Vitals:   02/15/15 1208  BP: 93/43  Pulse: 65  Temp: 36.4 C  Resp: 14    Complications: No apparent anesthesia complications

## 2015-02-15 NOTE — Brief Op Note (Signed)
02/15/2015  10:00 AM  PATIENT:  Ellen Henry  69 y.o. female  PRE-OPERATIVE DIAGNOSIS:  DEGENERATIVE JOINT DISEASE, SEVERE ENDSTAGE  LEFT KNEE  POST-OPERATIVE DIAGNOSIS:  degenerative joint disease  PROCEDURE:  Procedure(s): TOTAL KNEE ARTHROPLASTY (Left)  SURGEON:  Surgeon(s) and Role:    * [name], MD - Primary  PHYSICIAN ASSISTANT:   ASSISTANTS: bethune    ANESTHESIA:   general  EBL:  Total I/O In: 1000 [I.V.:1000] Out: 550 [Urine:400; Blood:150]  BLOOD ADMINISTERED:none  DRAINS: (1) Hemovact drain(s) in the l knee with  Suction Open   LOCAL MEDICATIONS USED:  OTHER experel  SPECIMEN:  No Specimen  DISPOSITION OF SPECIMEN:  N/A  COUNTS:  YES  TOURNIQUET:   Total Tourniquet Time Documented: Thigh (Left) - 67 minutes Total: Thigh (Left) - 67 minutes   DICTATION: .Other Dictation: Dictation Number [id number]  PLAN OF CARE: Admit to inpatient   PATIENT DISPOSITION:  PACU - hemodynamically stable.   Delay start of Pharmacological VTE agent (>24hrs) due to surgical blood loss or risk of bleeding: no

## 2015-02-15 NOTE — Progress Notes (Signed)
Orthopedic Tech Progress Note Patient Details:  Ellen Henry 27-Jan-1946 [id number]  CPM Left Knee CPM Left Knee: On Left Knee Flexion (Degrees): 6 Left Knee Extension (Degrees): 0 Additional Comments: trapeze bar patient helper Viewed order from doctor's order list  Hildred Priest 02/15/2015, 10:58 AM

## 2015-02-15 NOTE — Anesthesia Preprocedure Evaluation (Addendum)
Anesthesia Evaluation  Patient identified by MRN, date of birth, ID band Patient awake    Reviewed: Allergy & Precautions, NPO status , Patient's Chart, lab work & pertinent test results  Airway Mallampati: III  TM Distance: >3 FB Neck ROM: Full    Dental  (+) Edentulous Upper, Edentulous Lower   Pulmonary sleep apnea , Current Smoker,  breath sounds clear to auscultation        Cardiovascular hypertension, Pt. on medications Rhythm:Regular Rate:Normal     Neuro/Psych negative neurological ROS     GI/Hepatic Neg liver ROS, GERD-  ,  Endo/Other  Morbid obesity  Renal/GU negative Renal ROS     Musculoskeletal  (+) Arthritis -,   Abdominal   Peds  Hematology  (+) anemia ,   Anesthesia Other Findings   Reproductive/Obstetrics                            Anesthesia Physical Anesthesia Plan  ASA: III  Anesthesia Plan: Spinal   Post-op Pain Management:    Induction: Intravenous  Airway Management Planned: Simple Face Mask and Natural Airway  Additional Equipment:   Intra-op Plan:   Post-operative Plan:   Informed Consent: I have reviewed the patients History and Physical, chart, labs and discussed the procedure including the risks, benefits and alternatives for the proposed anesthesia with the patient or authorized representative who has indicated his/her understanding and acceptance.     Plan Discussed with: CRNA  Anesthesia Plan Comments:        Anesthesia Quick Evaluation

## 2015-02-15 NOTE — Progress Notes (Signed)
Utilization review completed.  

## 2015-02-16 LAB — URINALYSIS, ROUTINE W REFLEX MICROSCOPIC
Bilirubin Urine: NEGATIVE
Glucose, UA: NEGATIVE mg/dL
Hgb urine dipstick: NEGATIVE
Ketones, ur: NEGATIVE mg/dL
Leukocytes, UA: NEGATIVE
Nitrite: NEGATIVE
Protein, ur: NEGATIVE mg/dL
Specific Gravity, Urine: 1.016 (ref 1.005–1.030)
Urobilinogen, UA: 0.2 mg/dL (ref 0.0–1.0)
pH: 5 (ref 5.0–8.0)

## 2015-02-16 LAB — CBC
HCT: 29.4 % — ABNORMAL LOW (ref 36.0–46.0)
Hemoglobin: 9.5 g/dL — ABNORMAL LOW (ref 12.0–15.0)
MCH: 28.1 pg (ref 26.0–34.0)
MCHC: 32.3 g/dL (ref 30.0–36.0)
MCV: 87 fL (ref 78.0–100.0)
Platelets: 262 10*3/uL (ref 150–400)
RBC: 3.38 MIL/uL — ABNORMAL LOW (ref 3.87–5.11)
RDW: 13.3 % (ref 11.5–15.5)
WBC: 10.5 10*3/uL (ref 4.0–10.5)

## 2015-02-16 LAB — BASIC METABOLIC PANEL
Anion gap: 11 (ref 5–15)
BUN: 15 mg/dL (ref 6–23)
CO2: 22 mmol/L (ref 19–32)
Calcium: 8.4 mg/dL (ref 8.4–10.5)
Chloride: 107 mmol/L (ref 96–112)
Creatinine, Ser: 0.86 mg/dL (ref 0.50–1.10)
GFR calc Af Amer: 78 mL/min — ABNORMAL LOW (ref >90)
GFR calc non Af Amer: 67 mL/min — ABNORMAL LOW (ref >90)
Glucose, Bld: 111 mg/dL — ABNORMAL HIGH (ref 70–99)
Potassium: 4.1 mmol/L (ref 3.5–5.1)
Sodium: 140 mmol/L (ref 135–145)

## 2015-02-16 NOTE — Op Note (Addendum)
Ellen Henry, Ellen Henry              ACCOUNT NO.:  [id number]  MEDICAL RECORD NO.:  [id number]  LOCATION:  5N24C                        FACILITY:  [facility]  PHYSICIAN:  Alta Corning, M.D.   DATE OF BIRTH:  1946-08-15  Assistant:Jim Bethune PAc DATE OF PROCEDURE:  02/15/2015 DATE OF DISCHARGE:                              OPERATIVE REPORT   PREOPERATIVE DIAGNOSIS:  End-stage degenerative joint disease, left.  POSTOPERATIVE DIAGNOSES: 1. End-stage degenerative joint disease, left. 2. Morbid obesity with BMI of 47.  PROCEDURE: 1. Left total knee replacement with Sigma system, size 2.5 femur, size     2.5 MBT revision style tray, a 12.5 mm bridging bearing, and a 35     mm all-polyethylene patella. 2. Extensive complexity related to morbid obesity.  BRIEF HISTORY:  Ellen Henry is a 69 year old female with a history of significant bilateral severe knee pain.  She had been treated with a right total knee replacement several years ago.  She had done well with that.  She was continuing to have left knee pain.  After failure of all conservative care, she was taken to the operating room for left total knee replacement.  We had a long discussion preoperatively about her issues with weight and weight control and talked to her about ability to lose weight.  She felt she was unable because of her large size and we felt that given that she had done reasonable on the opposite side, it was reasonable to consider total knee replacement.  She was brought to the operating room for this procedure.  DESCRIPTION OF PROCEDURE:  The patient was brought into the operating room and after adequate anesthesia was obtained with general anesthetic, the patient was placed supine on the operating table.  The left leg was then prepped and draped in usual sterile fashion.  Once that was completed, the leg was exsanguinated.  Blood pressure tourniquet inflated to 350 mmHg.  Following this, a midline incision was  made in the subcutaneous tissue down the level of the extensor mechanism and at this point there was significant bleeding encountered.  At this point, we put sponges into the wound, let the tourniquet down, re-exsanguinated the patient, increased tourniquet pressure up to 400, and at this point came back to the procedure where a medial parapatellar arthrotomy was undertaken followed by resection of medial lateral meniscus, retropatellar fat pad, synovium from the anterior aspect of the femur, and anterior-posterior cruciates.  Retractors were put in place and we turned towards intramedullary pilot hole for the femur, this was drilled and a 5-degree valgus inclination was used and 11 mm of distal bone was resected.  At this point, we tried to put on the femoral guide, but because of her obesity and difficulty getting retractors in place, we were not able to address the femur adequately.  At this point, attention was turned to the tibia and the tibia was exposed, cut perpendicular to its long axis with an extramedullary guide with 3-degree posterior slope for an MBT style revision tray.  Once this was done, the attention was turned back to the femur, which was sized to a 2.5.  Anterior and posterior cuts were made, chamfers and  box.  Attention then turned to the tibia, which was sized to a 2.5, little bit generous in the posterolateral area, but I felt given her large size, we felt that we needed to do this, so with already a little bit of osteophyte medially, but I felt that I did not want to go down to a 2 with her large size. So at this point, the 2.5 was chosen, it was rotationally set, and then trials were put in place with a 10 spacer.  Attention was turned to the patella, cut down to a level of 13 mm and 35 paddle was chosen and lugs were drilled.  Lugs drilled for the femur.  Trial reduction was undertaken and excellent range of motion and stability was achieved.  At this point, all  trials were removed.  The knee was copiously and thoroughly lavaged with pulsatile lavage irrigation and suctioned dry, and the final components were cemented into place, size 2.5 MBT revision style tray, a size 2.5 femur, and a 10 mm bridging bearing trial was placed and a 35 all poly patella was placed and held with a clamp.  All excess bone cement was removed.  The bone cement was allowed to completely harden.  At this point, the tourniquet was let down.  All bleeding was controlled with electrocautery.  Attention was then turned towards the trial with the 12.5 with her morbid obesity very difficult to tell what the trial sizes are other than just trialing them, I put a 12.5 in and we get her into full extension and good stability in flexion.  At this point, I felt like that was probably the appropriate trial size and so a 12.5 was chosen and placed.  At this point, 40 mL of Exparel with 20 mL of Exparel and 20 mL of Marcaine were instilled throughout the knee for postoperative anesthesia.  A medium Hemovac drain was placed.  The medial parapatellar arthrotomy was closed with 1 Vicryl running and skin with 0 and 2-0 Vicryl and skin staples.  Sterile compressive dressing was applied, and the patient was taken to the recovery room and she was noted to be in satisfactory condition.  The case had significant complexity due to the morbid obesity with BMI of 47.  All of the steps were more complex and difficult.  It was obviously a concern for the patient having a greater postoperative morbidity, but the significance and difficulty of the case comes superiorly from her morbid obesity.  The estimated blood loss for the procedure was 150 mL.     Alta Corning, M.D.     Corliss Skains  D:  02/15/2015  T:  02/16/2015  Job:  [id number]  cc:   Alta Corning, M.D.

## 2015-02-16 NOTE — Progress Notes (Signed)
PT Cancellation Note  Patient Details Name: Ellen Henry MRN: [id number] DOB: 1946/11/23   Cancelled Treatment:    Reason Eval/Treat Not Completed: Pain limiting ability to participate (patient tearful "the medication just aint working!") Attempted to see patient various times during afternoon.  Will recheck on progress tomorrow.  [place], PT [phone] [place] 02/16/2015, 5:28 PM

## 2015-02-16 NOTE — Progress Notes (Signed)
Orthopedic Tech Progress Note Patient Details:  Ellen Henry 05-27-1946 [id number] Patient to receive bladder scan monetarily. Application of CPM postponed at this time.  CPM Left Knee CPM Left Knee: Off Left Knee Flexion (Degrees): 60 Left Knee Extension (Degrees): 0 Additional Comments: trapeze bar patient helper   [place] R Thompson 02/16/2015, 4:04 PM

## 2015-02-16 NOTE — Progress Notes (Signed)
Lab called; will need to re-draw blood due to clotting.  Passed information to on-coming staff.

## 2015-02-16 NOTE — Progress Notes (Signed)
    Patient doing well PO day 1 S/P L TKR by Dr Berenice Primas. Pt all smiles and states knee feeling much better already. Does have increased LBP from surgery and hospital bed. Good appetite, + passing gas  Physical Exam: BP 96/62 mmHg  Pulse 82  Temp(Src) 97.6 F (36.4 C) (Oral)  Resp 14  Ht 5' (1.524 m)  Wt 107.502 kg (237 lb)  BMI 46.29 kg/m2  SpO2 95%  Dressing in place, ice pack in place, drain in place and removed w/o difficulty. CPM at bedside, SCD on contralateral side NVI  POD #1 s/p L TKR per Dr Berenice Primas doing excellent  - Exacerbated chronic LBP - Improved L knee px, well controlled PO px - up with PT/OT, encourage ambulation  - Cont CPM - ASA 325 and SCD on contralateral side - Percocet for pain, robaxin for muscle spasms - likely d/c Sunday

## 2015-02-17 LAB — CBC
HCT: 29.2 % — ABNORMAL LOW (ref 36.0–46.0)
Hemoglobin: 9.4 g/dL — ABNORMAL LOW (ref 12.0–15.0)
MCH: 27.6 pg (ref 26.0–34.0)
MCHC: 32.2 g/dL (ref 30.0–36.0)
MCV: 85.9 fL (ref 78.0–100.0)
Platelets: 234 10*3/uL (ref 150–400)
RBC: 3.4 MIL/uL — ABNORMAL LOW (ref 3.87–5.11)
RDW: 13.4 % (ref 11.5–15.5)
WBC: 10.7 10*3/uL — ABNORMAL HIGH (ref 4.0–10.5)

## 2015-02-17 MED ORDER — OXYCODONE HCL ER 10 MG PO T12A
20.0000 mg | EXTENDED_RELEASE_TABLET | Freq: Two times a day (BID) | ORAL | Status: DC
Start: 2015-02-17 — End: 2015-02-18
  Administered 2015-02-17 – 2015-02-18 (×3): 20 mg via ORAL
  Filled 2015-02-17 (×2): qty 2

## 2015-02-17 NOTE — Progress Notes (Signed)
Physical Therapy Treatment Patient Details Name: Ellen Henry MRN: [id number] DOB: 09-10-1946 Today's Date: 02/17/2015    History of Present Illness Patient is a 69 yo female admitted 02/15/15 now s/p Lt TKA.  PMH:  HTN, OSA, arthritis, gout, neuropathy, radiculopathy, lumbar surgery 08/2014, Rt TKA    PT Comments    Continuing progress with prgoressive amb and activity tolerance; Still, pt became tearful with pain during session; On track for dc tomorrow; Needs stair training  Follow Up Recommendations  Home health PT;Supervision/Assistance - 24 hour     Equipment Recommendations  None recommended by PT    Recommendations for Other Services       Precautions / Restrictions Precautions Precautions: Knee Precaution Booklet Issued: No Precaution Comments: reviewed precautions with patient for positioning Required Braces or Orthoses: Knee Immobilizer - Left Knee Immobilizer - Left: On when out of bed or walking Restrictions Weight Bearing Restrictions: Yes LLE Weight Bearing: Weight bearing as tolerated    Mobility  Bed Mobility Overal bed mobility:  (OOB upon arrival)                Transfers Overall transfer level: Needs assistance Equipment used: Rolling walker (2 wheeled) Transfers: Sit to/from Stand Sit to Stand: Min assist         General transfer comment: min cues for hand and leg placement  Ambulation/Gait Ambulation/Gait assistance: Min guard;Min assist Ambulation Distance (Feet): 200 Feet Assistive device: Rolling walker (2 wheeled) Gait Pattern/deviations: Step-through pattern Gait velocity: Extremely slow   General Gait Details: heavy reliance on UE support; cues to activate L quad for stance stability   Stairs            Wheelchair Mobility    Modified Rankin (Stroke Patients Only)       Balance     Sitting balance-Leahy Scale: Good       Standing balance-Leahy Scale: Poor                      Cognition  Arousal/Alertness: Awake/alert Behavior During Therapy: WFL for tasks assessed/performed Overall Cognitive Status: Within Functional Limits for tasks assessed                      Exercises Total Joint Exercises Ankle Circles/Pumps:  (Tearful with pain, and pt requested we hold therex)    General Comments        Pertinent Vitals/Pain Pain Assessment: 0-10 Pain Score: 5  Pain Location: Lt knee; one instance of knee "giving way" within KI, which led pt to tears; that pain subsided relatively quickly Pain Descriptors / Indicators: Aching;Sore Pain Intervention(s): Limited activity within patient's tolerance;Monitored during session;Premedicated before session;Repositioned    Home Living                      Prior Function            PT Goals (current goals can now be found in the care plan section) Acute Rehab PT Goals Patient Stated Goal: be able to go to the bathroom on my own PT Goal Formulation: With patient Time For Goal Achievement: 02/22/15 Potential to Achieve Goals: Good Progress towards PT goals: Progressing toward goals    Frequency  7X/week    PT Plan Current plan remains appropriate    Co-evaluation             End of Session Equipment Utilized During Treatment: Left knee immobilizer Activity Tolerance: Patient tolerated treatment well  Patient left: in chair;with call bell/phone within reach;with family/visitor present     Time: 8676-1950 PT Time Calculation (min) (ACUTE ONLY): 45 min  Charges:  $Gait Training: 23-37 mins $Therapeutic Activity: 8-22 mins                    G Codes:      Ellen Henry 02/17/2015, 1:43 PM  Ellen Henry, Ellen Henry Pager [phone] Office [phone]

## 2015-02-17 NOTE — Progress Notes (Signed)
Physical Therapy Treatment Note  (Late entry for data put in doc flowsheets by Bernita Buffy, PT on 3/12)    02/16/15 1212  PT Visit Information  Last PT Received On 02/16/15  Assistance Needed +1  History of Present Illness Patient is a 69 yo female admitted 02/15/15 now s/p Lt TKA.  PMH:  HTN, OSA, arthritis, gout, neuropathy, radiculopathy, lumbar surgery 08/2014, Rt TKA  PT Time Calculation  PT Start Time (ACUTE ONLY) 1212  PT Stop Time (ACUTE ONLY) 1243  PT Time Calculation (min) (ACUTE ONLY) 31 min  Subjective Data  Subjective I want to get stronger  Patient Stated Goal be able to go to the bathroom on my own  Precautions  Precautions Knee  Precaution Booklet Issued No  Precaution Comments reviewed precautions with patient for positioning  Required Braces or Orthoses Knee Immobilizer - Left  Knee Immobilizer - Left On when out of bed or walking  Restrictions  Weight Bearing Restrictions Yes  LLE Weight Bearing WBAT  Pain Assessment  Pain Assessment 0-10  Pain Score 5  Pain Location left knee  Pain Descriptors / Indicators Sore;Constant;Sharp  Pain Intervention(s) Limited activity within patient's tolerance;Monitored during session;Repositioned;Ice applied  Cognition  Arousal/Alertness Awake/alert  Behavior During Therapy WFL for tasks assessed/performed  Overall Cognitive Status Within Functional Limits for tasks assessed  Bed Mobility  Overal bed mobility (OOB upon arrival)  Transfers  Overall transfer level Needs assistance  Equipment used Rolling walker (2 wheeled)  Transfers Sit to/from Stand  Sit to Stand Min assist  General transfer comment min cues for hand and leg placement  Ambulation/Gait  Ambulation/Gait assistance Min assist  Ambulation Distance (Feet) 100 Feet  Assistive device Rolling walker (2 wheeled)  Gait Pattern/deviations Step-to pattern;Decreased step length - right;Decreased stance time - left  General Gait Details heavy reliance on UE support   Balance  Overall balance assessment Needs assistance  Sitting-balance support No upper extremity supported;Feet supported  Sitting balance-Leahy Scale Good  Standing balance support Bilateral upper extremity supported  Standing balance-Leahy Scale Poor  Exercises  Exercises Total Joint  Total Joint Exercises  Ankle Circles/Pumps AROM;Both;10 reps;Seated  Quad Sets AROM;Left;10 reps;Seated  Short Arc Quad AAROM;Left;10 reps;Seated  Heel Slides AAROM;Left;10 reps;Seated  Long [employer] AAROM;10 reps;Left;Seated  Knee Flexion AAROM;Left;10 reps;Seated  Goniometric ROM 70  PT - End of Session  Equipment Utilized During Treatment Left knee immobilizer  Activity Tolerance Patient tolerated treatment well  Patient left in chair;with call bell/phone within reach;with family/visitor present  Nurse Communication Mobility status  PT - Assessment/Plan  PT Plan Current plan remains appropriate  PT Frequency (ACUTE ONLY) 7X/week  Follow Up Recommendations Home health PT;Supervision/Assistance - 24 hour  PT equipment None recommended by PT  PT Goal Progression  Progress towards PT goals Progressing toward goals  Acute Rehab PT Goals  PT Goal Formulation With patient  Time For Goal Achievement 02/22/15  Potential to Achieve Goals Good  PT General Charges  $$ ACUTE PT VISIT 1 Procedure  PT Treatments  $Gait Training 8-22 mins  $Therapeutic Exercise 8-22 mins   Roney [place], [place] Pager [phone] Office [phone] (for Bernita Buffy, PT)

## 2015-02-17 NOTE — Progress Notes (Signed)
   Patient doing well PO day 2 S/P L TKR by Dr Berenice Primas. Pt states LBP has resolved however knee is quite painful today, unable to complete PT and minimal use of CPM secondary to pain. Good appetite, + passing gas  Physical Exam: BP 120/69 mmHg  Pulse 100  Temp(Src) 98.9 F (37.2 C) (Oral)  Resp 16  Ht 5' (1.524 m)  Wt 107.502 kg (237 lb)  BMI 46.29 kg/m2  SpO2 92%  Dressing in place, ice pack in place, CPM at bedside, SCD on contralateral side NVI  POD #2 s/p L TKR per Dr Berenice Primas doing excellent  - Resolved exacerbation of chronic LBP - Improved L knee px, increased PO px since yesterday limiting PT and gains - Cont PT/OT  - Cont CPM - ASA 325 and SCD on contralateral side - Percocet for pain, robaxin for muscle spasms - Add oxycontin q12hrs for 7days - likely d/c Monday

## 2015-02-18 LAB — CBC
HCT: 26.4 % — ABNORMAL LOW (ref 36.0–46.0)
Hemoglobin: 8.7 g/dL — ABNORMAL LOW (ref 12.0–15.0)
MCH: 28.1 pg (ref 26.0–34.0)
MCHC: 33 g/dL (ref 30.0–36.0)
MCV: 85.2 fL (ref 78.0–100.0)
Platelets: 241 10*3/uL (ref 150–400)
RBC: 3.1 MIL/uL — ABNORMAL LOW (ref 3.87–5.11)
RDW: 13.5 % (ref 11.5–15.5)
WBC: 9.7 10*3/uL (ref 4.0–10.5)

## 2015-02-18 MED ORDER — BETHANECHOL CHLORIDE 25 MG PO TABS: 25.0000 mg | ORAL_TABLET | Freq: Three times a day (TID) | ORAL | Status: DC

## 2015-02-18 MED ORDER — OXYCODONE HCL ER 20 MG PO T12A: 20.0000 mg | EXTENDED_RELEASE_TABLET | Freq: Two times a day (BID) | ORAL | Status: DC

## 2015-02-18 MED ORDER — BETHANECHOL CHLORIDE 25 MG PO TABS
25.0000 mg | ORAL_TABLET | Freq: Three times a day (TID) | ORAL | Status: DC
  Administered 2015-02-18: 25 mg via ORAL
  Filled 2015-02-18 (×2): qty 1

## 2015-02-18 NOTE — Progress Notes (Signed)
CARE MANAGEMENT NOTE 02/18/2015  Patient:  Ellen Henry, Ellen Henry   Account Number:  [id number]  Date Initiated:  02/15/2015  Documentation initiated by:  [facility]  Subjective/Objective Assessment:   s/p left  TKA     Action/Plan:   PT/OT evals-recommended HHPT   Anticipated DC Date:  02/18/2015   Anticipated DC Plan:  [place]  CM consult      [facility] Choice  [place]   Choice offered to / List presented to:  C-1 Patient   DME arranged  CPM      DME agency  [employer]     [place] arranged  HH-2 PT      [place]   Status of service:  Completed, signed off Medicare Important Message given?  YES Date Medicare IM given:  02/18/2015 Medicare IM given by:  [facility]  Discharge Disposition:  [place]  Per UR Regulation:  Reviewed for med. necessity/level of care/duration of stay   Comments:  02/18/15 Spoke with patient, no change in discharge plan. She stated that [employer] was going to provide her CPM and she already has a rolling walker and 3N1. Contacted Brent with [employer] they will be providing the CPM. No other d/c needs identified.  02/15/15 Received call from [place] at [facility], patient is set up with them for HHPT.They need a copy of the HHPT order and the d/c summary faxed to [phone] when patient is discharged.

## 2015-02-18 NOTE — Progress Notes (Signed)
Physical Therapy Treatment Patient Details Name: Ellen Henry MRN: [id number] DOB: [birth year] Today's Date: 02/18/2015    History of Present Illness Patient is a 69 yo female admitted 02/15/15 now s/p Lt TKA.  PMH:  HTN, OSA, arthritis, gout, neuropathy, radiculopathy, lumbar surgery 08/2014, Rt TKA    PT Comments    Pt tolerated treatment well and was able to demonstrate navigating stairs backwards with assist x 1 to stabilize walker.  Pt continues to progress well, would benefit from continued therapy to address LE weakness and quad activation necessary for ambulation without knee immobilizer.     Follow Up Recommendations  Home health PT;Supervision/Assistance - 24 hour     Equipment Recommendations  None recommended by PT    Recommendations for Other Services       Precautions / Restrictions Precautions Precautions: Knee Precaution Booklet Issued: No Required Braces or Orthoses: Knee Immobilizer - Left Knee Immobilizer - Left: On when out of bed or walking Restrictions Weight Bearing Restrictions: Yes LLE Weight Bearing: Weight bearing as tolerated    Mobility  Bed Mobility Overal bed mobility: Needs Assistance Bed Mobility: Supine to Sit     Supine to sit: Min assist     General bed mobility comments: Needs help moving L leg  Transfers Overall transfer level: Needs assistance Equipment used: Rolling walker (2 wheeled) Transfers: Sit to/from Stand Sit to Stand: Min assist;Min guard         General transfer comment: still needed cueing for hand placement and safe transfer sit to stand  Ambulation/Gait Ambulation/Gait assistance: Min guard Ambulation Distance (Feet): 300 Feet Assistive device: Rolling walker (2 wheeled) Gait Pattern/deviations: Step-through pattern Gait velocity: Slow   General Gait Details: Still relied on UE support   Stairs Stairs: Yes Stairs assistance: Min assist (Needs someone to hold walker to ambulate stairs  backwards) Stair Management: Backwards Number of Stairs: 2 General stair comments: Pt was able to navigate two stairs backwards properly with assist x 1 to secure walker   Wheelchair Mobility    Modified Rankin (Stroke Patients Only)       Balance           Standing balance support: Bilateral upper extremity supported;During functional activity Standing balance-Leahy Scale: Fair Standing balance comment: Pt able to stand without UE support while gait belt was removed                    Cognition Arousal/Alertness: Awake/alert Behavior During Therapy: WFL for tasks assessed/performed Overall Cognitive Status: Within Functional Limits for tasks assessed                      Exercises Total Joint Exercises Ankle Circles/Pumps: 5 reps;AROM;Left;Seated Quad Sets: 5 reps;Seated;Left Heel Slides:  (Instructed in bending knee 10x when she returns to bed)    General Comments        Pertinent Vitals/Pain Pain Assessment: 0-10 Pain Score: 6  Pain Location: L knee Pain Descriptors / Indicators: Aching Pain Intervention(s): Premedicated before session    Home Living                      Prior Function            PT Goals (current goals can now be found in the care plan section) Acute Rehab PT Goals Patient Stated Goal: To be able to get up to go to the bathroom Progress towards PT goals: Progressing toward goals    Frequency  7X/week    PT Plan Current plan remains appropriate    Co-evaluation             End of Session Equipment Utilized During Treatment: Left knee immobilizer Activity Tolerance: Patient tolerated treatment well Patient left: in chair;with call bell/phone within reach     Time: 0839-0912 PT Time Calculation (min) (ACUTE ONLY): 33 min  Charges:                       G Codes:      [place],Kailee 03/13/15, 10:17 AM  Lucas Mallow, SPT (student physical therapist) Office phone: [phone]

## 2015-02-18 NOTE — Progress Notes (Signed)
Subjective: 3 Days Post-Op Procedure(s) (LRB): TOTAL KNEE ARTHROPLASTY (Left) Patient reports pain as moderate.    Objective: Vital signs in last 24 hours: Temp:  [98.2 F (36.8 C)-98.4 F (36.9 C)] 98.2 F (36.8 C) (03/14 0610) Pulse Rate:  [86-100] 86 (03/14 0610) Resp:  [16-18] 16 (03/14 0610) BP: (100-102)/(59-77) 102/77 mmHg (03/14 0610) SpO2:  [97 %-100 %] 97 % (03/14 0610)  Intake/Output from previous day: 03/13 0701 - 03/14 0700 In: 2580 [P.O.:480; I.V.:2100] Out: 3200 [Urine:3200] Intake/Output this shift:     Recent Labs  02/16/15 0830 02/17/15 0603 02/18/15 0618  HGB 9.5* 9.4* 8.7*    Recent Labs  02/17/15 0603 02/18/15 0618  WBC 10.7* 9.7  RBC 3.40* 3.10*  HCT 29.2* 26.4*  PLT 234 241    Recent Labs  02/16/15 0630  NA 140  K 4.1  CL 107  CO2 22  BUN 15  CREATININE 0.86  GLUCOSE 111*  CALCIUM 8.4   No results for input(s): LABPT, INR in the last 72 hours.  Neurologically intact ABD soft Neurovascular intact Sensation intact distally Intact pulses distally No cellulitis present Compartment soft  Assessment/Plan: 3 Days Post-Op Procedure(s) (LRB): TOTAL KNEE ARTHROPLASTY (Left) unable to void  Advance diet Up with therapy Discharge home with home health if voiding independently Pt needs to be able to void on her own and if not will need foley replaced   GRAVES,JOHN L 02/18/2015, 8:31 AM

## 2015-02-19 ENCOUNTER — Encounter (HOSPITAL_COMMUNITY): Payer: Self-pay | Admitting: Orthopedic Surgery

## 2015-02-25 DIAGNOSIS — R339 Retention of urine, unspecified: Secondary | ICD-10-CM

## 2015-02-25 NOTE — Discharge Summary (Signed)
Patient ID: Ellen Henry MRN: [id number] DOB/AGE: 08-21-1946 69 y.o.  Admit date: 02/15/2015 Discharge date: 02/18/2015 Admission Diagnoses:  Principal Problem:   Primary osteoarthritis of left knee Active Problems:   Morbid obesity   Urinary retention   Discharge Diagnoses:  Same  Past Medical History  Diagnosis Date  . 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 Hypertension   . Sleep apnea     does not use cpap  . Pneumonia   . H/O pyelonephritis     as a child  . GERD (gastroesophageal reflux disease)     "years ago", diet controlled  . Arthritis   . Gout   . Neuropathy   . Dry skin   . Radiculopathy     Surgeries: Procedure(s):left TOTAL KNEE ARTHROPLASTY on 02/15/2015    Discharged Condition: Improved  Hospital Course: Ewelina Naves is an 69 y.o. female who was admitted 02/15/2015 for operative treatment ofPrimary osteoarthritis of left knee. Patient has severe unremitting pain that affects sleep, daily activities, and work/hobbies. After pre-op clearance the patient was taken to the operating room on 02/15/2015 and underwent  Procedure(s):left TOTAL KNEE ARTHROPLASTY.    Patient was given perioperative antibiotics:  Anti-infectives    Start     Dose/Rate Route Frequency Ordered Stop   02/15/15 1400  ceFAZolin (ANCEF) IVPB 2 g/50 mL premix     2 g 100 mL/hr over 30 Minutes Intravenous Every 6 hours 02/15/15 1213 02/15/15 2133   02/15/15 0600  ceFAZolin (ANCEF) IVPB 2 g/50 mL premix     2 g 100 mL/hr over 30 Minutes Intravenous On call to O.R. 02/14/15 1257 02/15/15 0805       Patient was given sequential compression devices, early ambulation, and chemoprophylaxis to prevent DVT.the patient had significant left knee pain which was not controlled with Percocet. We added OxyContin 20 mg twice daily. On postoperative day #3 the patient was taking fluids by mouth, voiding okay, passing gas, and had good pain management. She did well with physical  therapy at this point.  Patient benefited maximally from hospital stay and there were no complications.    Recent vital signs: see chart for details   Recent laboratory studies: Recent Results (from the past 2160 hour(s))  Surgical pcr screen     Status: None   Collection Time: 02/11/15 12:44 PM  Result Value Ref Range   MRSA, PCR NEGATIVE NEGATIVE   Staphylococcus aureus NEGATIVE NEGATIVE    Comment:        The Xpert SA Assay (FDA approved for NASAL specimens in patients over 89 years of age), is one component of a comprehensive surveillance program.  Test performance has been validated by [facility] for patients greater than or equal to 21 year old. It is not intended to diagnose infection nor to guide or monitor treatment.   Type and screen     Status: None   Collection Time: 02/11/15  1:00 PM  Result Value Ref Range   ABO/RH(D) A POS    Antibody Screen NEG    Sample Expiration 02/25/2015   ABO/Rh     Status: None   Collection Time: 02/11/15  1:00 PM  Result Value Ref Range   ABO/RH(D) A POS   APTT     Status: None   Collection Time: 02/11/15  1:02 PM  Result Value Ref Range   aPTT 35 24 - 37 seconds  CBC WITH DIFFERENTIAL     Status: Abnormal  Collection Time: 02/11/15  1:02 PM  Result Value Ref Range   WBC 7.2 4.0 - 10.5 K/uL   RBC 4.17 3.87 - 5.11 MIL/uL   Hemoglobin 11.6 (L) 12.0 - 15.0 g/dL   HCT 35.4 (L) 36.0 - 46.0 %   MCV 84.9 78.0 - 100.0 fL   MCH 27.8 26.0 - 34.0 pg   MCHC 32.8 30.0 - 36.0 g/dL   RDW 13.3 11.5 - 15.5 %   Platelets 229 150 - 400 K/uL   Neutrophils Relative % 48 43 - 77 %   Neutro Abs 3.4 1.7 - 7.7 K/uL   Lymphocytes Relative 41 12 - 46 %   Lymphs Abs 2.9 0.7 - 4.0 K/uL   Monocytes Relative 8 3 - 12 %   Monocytes Absolute 0.6 0.1 - 1.0 K/uL   Eosinophils Relative 3 0 - 5 %   Eosinophils Absolute 0.2 0.0 - 0.7 K/uL   Basophils Relative 0 0 - 1 %   Basophils Absolute 0.0 0.0 - 0.1 K/uL  Comprehensive metabolic panel     Status:  Abnormal   Collection Time: 02/11/15  1:02 PM  Result Value Ref Range   Sodium 142 135 - 145 mmol/L   Potassium 3.4 (L) 3.5 - 5.1 mmol/L   Chloride 103 96 - 112 mmol/L   CO2 31 19 - 32 mmol/L   Glucose, Bld 96 70 - 99 mg/dL   BUN 11 6 - 23 mg/dL   Creatinine, Ser 0.77 0.50 - 1.10 mg/dL   Calcium 9.0 8.4 - 10.5 mg/dL   Total Protein 7.1 6.0 - 8.3 g/dL   Albumin 3.5 3.5 - 5.2 g/dL   AST 34 0 - 37 U/L   ALT 23 0 - 35 U/L   Alkaline Phosphatase 132 (H) 39 - 117 U/L   Total Bilirubin 0.6 0.3 - 1.2 mg/dL   GFR calc non Af Amer 84 (L) >90 mL/min   GFR calc Af Amer >90 >90 mL/min    Comment: (NOTE) The eGFR has been calculated using the CKD EPI equation. This calculation has not been validated in all clinical situations. eGFR's persistently <90 mL/min signify possible Chronic Kidney Disease.    Anion gap 8 5 - 15  Protime-INR     Status: None   Collection Time: 02/11/15  1:02 PM  Result Value Ref Range   Prothrombin Time 12.9 11.6 - 15.2 seconds   INR 0.97 0.00 - 1.49  Urinalysis, Routine w reflex microscopic     Status: None   Collection Time: 02/11/15  1:03 PM  Result Value Ref Range   Color, Urine YELLOW YELLOW   APPearance CLEAR CLEAR   Specific Gravity, Urine 1.013 1.005 - 1.030   pH 7.5 5.0 - 8.0   Glucose, UA NEGATIVE NEGATIVE mg/dL   Hgb urine dipstick NEGATIVE NEGATIVE   Bilirubin Urine NEGATIVE NEGATIVE   Ketones, ur NEGATIVE NEGATIVE mg/dL   Protein, ur NEGATIVE NEGATIVE mg/dL   Urobilinogen, UA 0.2 0.0 - 1.0 mg/dL   Nitrite NEGATIVE NEGATIVE   Leukocytes, UA NEGATIVE NEGATIVE    Comment: MICROSCOPIC NOT DONE ON URINES WITH NEGATIVE PROTEIN, BLOOD, LEUKOCYTES, NITRITE, OR GLUCOSE <1000 mg/dL.  Basic metabolic panel     Status: Abnormal   Collection Time: 02/16/15  6:30 AM  Result Value Ref Range   Sodium 140 135 - 145 mmol/L   Potassium 4.1 3.5 - 5.1 mmol/L   Chloride 107 96 - 112 mmol/L   CO2 22 19 - 32 mmol/L  Glucose, Bld 111 (H) 70 - 99 mg/dL   BUN 15 6  - 23 mg/dL   Creatinine, Ser 0.86 0.50 - 1.10 mg/dL   Calcium 8.4 8.4 - 10.5 mg/dL   GFR calc non Af Amer 67 (L) >90 mL/min   GFR calc Af Amer 78 (L) >90 mL/min    Comment: (NOTE) The eGFR has been calculated using the CKD EPI equation. This calculation has not been validated in all clinical situations. eGFR's persistently <90 mL/min signify possible Chronic Kidney Disease.    Anion gap 11 5 - 15  CBC     Status: Abnormal   Collection Time: 02/16/15  8:30 AM  Result Value Ref Range   WBC 10.5 4.0 - 10.5 K/uL   RBC 3.38 (L) 3.87 - 5.11 MIL/uL   Hemoglobin 9.5 (L) 12.0 - 15.0 g/dL   HCT 29.4 (L) 36.0 - 46.0 %   MCV 87.0 78.0 - 100.0 fL   MCH 28.1 26.0 - 34.0 pg   MCHC 32.3 30.0 - 36.0 g/dL   RDW 13.3 11.5 - 15.5 %   Platelets 262 150 - 400 K/uL  Urinalysis, Routine w reflex microscopic     Status: None   Collection Time: 02/16/15  4:49 PM  Result Value Ref Range   Color, Urine YELLOW YELLOW   APPearance CLEAR CLEAR   Specific Gravity, Urine 1.016 1.005 - 1.030   pH 5.0 5.0 - 8.0   Glucose, UA NEGATIVE NEGATIVE mg/dL   Hgb urine dipstick NEGATIVE NEGATIVE   Bilirubin Urine NEGATIVE NEGATIVE   Ketones, ur NEGATIVE NEGATIVE mg/dL   Protein, ur NEGATIVE NEGATIVE mg/dL   Urobilinogen, UA 0.2 0.0 - 1.0 mg/dL   Nitrite NEGATIVE NEGATIVE   Leukocytes, UA NEGATIVE NEGATIVE    Comment: MICROSCOPIC NOT DONE ON URINES WITH NEGATIVE PROTEIN, BLOOD, LEUKOCYTES, NITRITE, OR GLUCOSE <1000 mg/dL.  CBC     Status: Abnormal   Collection Time: 02/17/15  6:03 AM  Result Value Ref Range   WBC 10.7 (H) 4.0 - 10.5 K/uL   RBC 3.40 (L) 3.87 - 5.11 MIL/uL   Hemoglobin 9.4 (L) 12.0 - 15.0 g/dL   HCT 29.2 (L) 36.0 - 46.0 %   MCV 85.9 78.0 - 100.0 fL   MCH 27.6 26.0 - 34.0 pg   MCHC 32.2 30.0 - 36.0 g/dL   RDW 13.4 11.5 - 15.5 %   Platelets 234 150 - 400 K/uL  CBC     Status: Abnormal   Collection Time: 02/18/15  6:18 AM  Result Value Ref Range   WBC 9.7 4.0 - 10.5 K/uL   RBC 3.10 (L) 3.87 -  5.11 MIL/uL   Hemoglobin 8.7 (L) 12.0 - 15.0 g/dL   HCT 26.4 (L) 36.0 - 46.0 %   MCV 85.2 78.0 - 100.0 fL   MCH 28.1 26.0 - 34.0 pg   MCHC 33.0 30.0 - 36.0 g/dL   RDW 13.5 11.5 - 15.5 %   Platelets 241 150 - 400 K/uL         Discharge Medications:     Medication List    STOP taking these medications        HYDROcodone-acetaminophen 5-325 MG per tablet  Commonly known as:  NORCO/VICODIN     meloxicam 15 MG tablet  Commonly known as:  MOBIC     traMADol 50 MG tablet  Commonly known as:  ULTRAM      TAKE these medications        aspirin EC 325  MG tablet  Take 1 tablet (325 mg total) by mouth 2 (two) times daily after a meal. Take x 1 month post op to decrease risk of blood clots.     atorvastatin 40 MG tablet  Commonly known as:  LIPITOR  Take 40 mg by mouth daily.     bethanechol 25 MG tablet  Commonly known as:  URECHOLINE  Take 1 tablet (25 mg total) by mouth 3 (three) times daily.     furosemide 40 MG tablet  Commonly known as:  LASIX  Take 40 mg by mouth daily.     gabapentin 600 MG tablet  Commonly known as:  NEURONTIN  Take 600 mg by mouth 3 (three) times daily.     KLOR-CON M20 20 MEQ tablet  Generic drug:  potassium chloride SA  Take 20 mEq by mouth daily.     methocarbamol 500 MG tablet  Commonly known as:  ROBAXIN  Take 1 tablet (500 mg total) by mouth every 8 (eight) hours as needed for muscle spasms.     OVER THE COUNTER MEDICATION     OxyCODONE 20 mg T12a 12 hr tablet  Commonly known as:  OXYCONTIN  Take 1 tablet (20 mg total) by mouth every 12 (twelve) hours.     oxyCODONE-acetaminophen 5-325 MG per tablet  Commonly known as:  PERCOCET/ROXICET  Take 1-2 tablets by mouth every 4 (four) hours as needed.     TRIBENZOR 20-5-12.5 MG Tabs  Generic drug:  Olmesartan-Amlodipine-HCTZ  Take 1 tablet by mouth daily.        Diagnostic Studies: No results found.  Disposition: 01-Home or Self Care      Discharge Instructions    CPM     Complete by:  As directed   Continuous passive motion machine (CPM):      Use the CPM from 0 to 60 for 8 hours per day.      You may increase by 5-10 per day.  You may break it up into 2 or 3 sessions per day.      Use CPM for 1-2 weeks or until you are told to stop.     Call MD / Call 911    Complete by:  As directed   If you experience chest pain or shortness of breath, CALL 911 and be transported to the hospital emergency room.  If you develope a fever above 101 F, pus (white drainage) or increased drainage or redness at the wound, or calf pain, call your surgeon's office.     Constipation Prevention    Complete by:  As directed   Drink plenty of fluids.  Prune juice may be helpful.  You may use a stool softener, such as Colace (over the counter) 100 mg twice a day.  Use MiraLax (over the counter) for constipation as needed.     Diet general    Complete by:  As directed      Do not put a pillow under the knee. Place it under the heel.    Complete by:  As directed      Increase activity slowly as tolerated    Complete by:  As directed      Weight bearing as tolerated    Complete by:  As directed   Laterality:  left  Extremity:  Lower     Weight bearing as tolerated    Complete by:  As directed   Laterality:  left  Extremity:  Lower  Follow-up Information    Follow up with GRAVES,JOHN L, MD. Schedule an appointment as soon as possible for a visit in 2 weeks.   Specialty:  Orthopedic Surgery   Contact information:   [place] [address] [phone]       Follow up with [facility].   Specialty:  Home Health Services   Why:  They will contact you to schedule home therapy visits.    Contact information:   [address] [place] [address] [phone]        Signed: Erlene Senters 02/25/2015, 4:50 PM

## 2015-03-05 ENCOUNTER — Other Ambulatory Visit: Payer: Self-pay | Admitting: Orthopedic Surgery

## 2015-03-05 ENCOUNTER — Ambulatory Visit
Admission: RE | Admit: 2015-03-05 | Discharge: 2015-03-05 | Disposition: A | Discharge status: Home / Self Care | Payer: Medicare Other | Source: Ambulatory Visit | Attending: Orthopedic Surgery | Admitting: Orthopedic Surgery

## 2015-03-05 DIAGNOSIS — R52 Pain, unspecified: Secondary | ICD-10-CM

## 2015-03-10 ENCOUNTER — Emergency Department (HOSPITAL_COMMUNITY)
Admission: EM | Admit: 2015-03-10 | Discharge: 2015-03-10 | Disposition: A | Discharge status: Home / Self Care | Payer: Medicare Other | Attending: Emergency Medicine | Admitting: Emergency Medicine

## 2015-03-10 ENCOUNTER — Encounter (HOSPITAL_COMMUNITY): Payer: Self-pay | Admitting: Emergency Medicine

## 2015-03-10 DIAGNOSIS — Z720 Tobacco use: Secondary | ICD-10-CM | POA: Insufficient documentation

## 2015-03-10 DIAGNOSIS — Z7982 Long term (current) use of aspirin: Secondary | ICD-10-CM | POA: Insufficient documentation

## 2015-03-10 DIAGNOSIS — R011 Cardiac murmur, unspecified: Secondary | ICD-10-CM | POA: Diagnosis not present

## 2015-03-10 DIAGNOSIS — Z79899 Other long term (current) drug therapy: Secondary | ICD-10-CM | POA: Diagnosis not present

## 2015-03-10 DIAGNOSIS — M1990 Unspecified osteoarthritis, unspecified site: Secondary | ICD-10-CM | POA: Insufficient documentation

## 2015-03-10 DIAGNOSIS — I10 Essential (primary) hypertension: Secondary | ICD-10-CM | POA: Insufficient documentation

## 2015-03-10 DIAGNOSIS — Z792 Long term (current) use of antibiotics: Secondary | ICD-10-CM | POA: Insufficient documentation

## 2015-03-10 DIAGNOSIS — Z8701 Personal history of pneumonia (recurrent): Secondary | ICD-10-CM | POA: Diagnosis not present

## 2015-03-10 DIAGNOSIS — Z872 Personal history of diseases of the skin and subcutaneous tissue: Secondary | ICD-10-CM | POA: Diagnosis not present

## 2015-03-10 DIAGNOSIS — K219 Gastro-esophageal reflux disease without esophagitis: Secondary | ICD-10-CM | POA: Diagnosis not present

## 2015-03-10 DIAGNOSIS — Z8669 Personal history of other diseases of the nervous system and sense organs: Secondary | ICD-10-CM | POA: Insufficient documentation

## 2015-03-10 DIAGNOSIS — Z96652 Presence of left artificial knee joint: Secondary | ICD-10-CM | POA: Insufficient documentation

## 2015-03-10 DIAGNOSIS — M25562 Pain in left knee: Secondary | ICD-10-CM | POA: Diagnosis not present

## 2015-03-10 DIAGNOSIS — G629 Polyneuropathy, unspecified: Secondary | ICD-10-CM | POA: Insufficient documentation

## 2015-03-10 DIAGNOSIS — Z8742 Personal history of other diseases of the female genital tract: Secondary | ICD-10-CM | POA: Insufficient documentation

## 2015-03-10 MED ORDER — OXYCODONE-ACETAMINOPHEN 5-325 MG PO TABS
2.0000 | ORAL_TABLET | Freq: Once | ORAL | Status: AC
  Administered 2015-03-10: 2 via ORAL
  Filled 2015-03-10: qty 2

## 2015-03-10 MED ORDER — OXYCODONE HCL 20 MG PO TABS: 20.0000 mg | ORAL_TABLET | Freq: Four times a day (QID) | ORAL | Status: DC | PRN

## 2015-03-10 NOTE — Discharge Instructions (Signed)
Cryotherapy °Cryotherapy means treatment with cold. Ice or gel packs can be used to reduce both pain and swelling. Ice is the most helpful within the first 24 to 48 hours after an injury or flare-up from overusing a muscle or joint. Sprains, strains, spasms, burning pain, shooting pain, and aches can all be eased with ice. Ice can also be used when recovering from surgery. Ice is effective, has very few side effects, and is safe for most people to use. °PRECAUTIONS  °Ice is not a safe treatment option for people with: °· Raynaud phenomenon. This is a condition affecting small blood vessels in the extremities. Exposure to cold may cause your problems to return. °· Cold hypersensitivity. There are many forms of cold hypersensitivity, including: °¨ Cold urticaria. Red, itchy hives appear on the skin when the tissues begin to warm after being iced. °¨ Cold erythema. This is a red, itchy rash caused by exposure to cold. °¨ Cold hemoglobinuria. Red blood cells break down when the tissues begin to warm after being iced. The hemoglobin that carry oxygen are passed into the urine because they cannot combine with blood proteins fast enough. °· Numbness or altered sensitivity in the area being iced. °If you have any of the following conditions, do not use ice until you have discussed cryotherapy with your caregiver: °· Heart conditions, such as arrhythmia, angina, or chronic heart disease. °· High blood pressure. °· Healing wounds or open skin in the area being iced. °· Current infections. °· Rheumatoid arthritis. °· Poor circulation. °· Diabetes. °Ice slows the blood flow in the region it is applied. This is beneficial when trying to stop inflamed tissues from spreading irritating chemicals to surrounding tissues. However, if you expose your skin to cold temperatures for too long or without the proper protection, you can damage your skin or nerves. Watch for signs of skin damage due to cold. °HOME CARE INSTRUCTIONS °Follow  these tips to use ice and cold packs safely. °· Place a dry or damp towel between the ice and skin. A damp towel will cool the skin more quickly, so you may need to shorten the time that the ice is used. °· For a more rapid response, add gentle compression to the ice. °· Ice for no more than 10 to 20 minutes at a time. The bonier the area you are icing, the less time it will take to get the benefits of ice. °· Check your skin after 5 minutes to make sure there are no signs of a poor response to cold or skin damage. °· Rest 20 minutes or more between uses. °· Once your skin is numb, you can end your treatment. You can test numbness by very lightly touching your skin. The touch should be so light that you do not see the skin dimple from the pressure of your fingertip. When using ice, most people will feel these normal sensations in this order: cold, burning, aching, and numbness. °· Do not use ice on someone who cannot communicate their responses to pain, such as small children or people with dementia. °HOW TO MAKE AN ICE PACK °Ice packs are the most common way to use ice therapy. Other methods include ice massage, ice baths, and cryosprays. Muscle creams that cause a cold, tingly feeling do not offer the same benefits that ice offers and should not be used as a substitute unless recommended by your caregiver. °To make an ice pack, do one of the following: °· Place crushed ice or a   bag of frozen vegetables in a sealable plastic bag. Squeeze out the excess air. Place this bag inside another plastic bag. Slide the bag into a pillowcase or place a damp towel between your skin and the bag. °· Mix 3 parts water with 1 part rubbing alcohol. Freeze the mixture in a sealable plastic bag. When you remove the mixture from the freezer, it will be slushy. Squeeze out the excess air. Place this bag inside another plastic bag. Slide the bag into a pillowcase or place a damp towel between your skin and the bag. °SEEK MEDICAL CARE  IF: °· You develop white spots on your skin. This may give the skin a blotchy (mottled) appearance. °· Your skin turns blue or pale. °· Your skin becomes waxy or hard. °· Your swelling gets worse. °MAKE SURE YOU:  °· Understand these instructions. °· Will watch your condition. °· Will get help right away if you are not doing well or get worse. °Document Released: 07/20/2011 Document Revised: 04/09/2014 Document Reviewed: 07/20/2011 °Exit[employer]® Patient Information ©2015 [employer]. This information is not intended to replace advice given to you by your health care provider. Make sure you discuss any questions you have with your health care provider. ° °

## 2015-03-10 NOTE — ED Provider Notes (Signed)
CSN: [id number]     Arrival date & time 03/10/15  0355 History   First MD Initiated Contact with Patient 03/10/15 0507     Chief Complaint  Patient presents with  . Knee Pain     (Consider location/radiation/quality/duration/timing/severity/associated sxs/prior Treatment) Patient is a 69 y.o. female presenting with knee pain. The history is provided by the patient.  Knee Pain Location:  Knee Knee location:  L knee Pain details:    Severity:  Moderate Associated symptoms: no fever   Associated symptoms comment:  She had left knee replacement on 02/15/15 with postoperative infection, treated by Dr. Berenice Primas with daily antibiotic shots in his office until last Wednesday (5 days ago). She presents tonight with complaint of left knee pain. She feels the infection is under control and the knee has no more redness than over the last several days. No fever. No calf or thigh pain. She had been taking oxycodone but is currently out of pain medication.   Past Medical History  Diagnosis Date  . 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 Hypertension   . Sleep apnea     does not use cpap  . Pneumonia   . H/O pyelonephritis     as a child  . GERD (gastroesophageal reflux disease)     "years ago", diet controlled  . Arthritis   . Gout   . Neuropathy   . Dry skin   . Radiculopathy    Past Surgical History  Procedure Laterality Date  . Carpal tunnel release Right     x 2  . Back surgery  08/28/2014 and 08/29/2014    lumbar fusion  . Tonsillectomy    . Colonoscopy    . Appendectomy    . Abdominal hysterectomy    . Joint replacement      right knee  . Total knee arthroplasty Left 02/15/2015    Procedure: TOTAL KNEE ARTHROPLASTY;  Surgeon: [name], MD;  Location: [facility];  Service: Orthopedics;  Laterality: Left;   No family history on file. History  Substance Use Topics  . Smoking status: Current Every Day Smoker -- 0.25 packs/day    Types: Cigarettes  . Smokeless  tobacco: Never Used  . Alcohol Use: No   OB History    No data available     Review of Systems  Constitutional: Negative for fever and chills.  Musculoskeletal:       See HPI.  Skin: Negative.  Negative for color change.  Neurological: Negative.  Negative for numbness.      Allergies  Review of patient's allergies indicates no known allergies.  Home Medications   Prior to Admission medications   Medication Sig Start Date End Date Taking? Authorizing Provider  aspirin EC 325 MG tablet Take 1 tablet (325 mg total) by mouth 2 (two) times daily after a meal. Take x 1 month post op to decrease risk of blood clots. 02/15/15  Yes Gary Fleet, PA-C  atorvastatin (LIPITOR) 40 MG tablet Take 40 mg by mouth daily. 01/09/15  Yes Historical Provider, MD  bethanechol (URECHOLINE) 25 MG tablet Take 1 tablet (25 mg total) by mouth 3 (three) times daily. 02/18/15  Yes Gary Fleet, PA-C  cephALEXin (KEFLEX) 500 MG capsule Take 500 mg by mouth 4 (four) times daily. 03/04/15  Yes Historical Provider, MD  furosemide (LASIX) 40 MG tablet Take 40 mg by mouth daily.  01/09/15  Yes Historical Provider, MD  gabapentin (NEURONTIN) 600 MG  tablet Take 600 mg by mouth 3 (three) times daily. 01/09/15  Yes Historical Provider, MD  KLOR-CON M20 20 MEQ tablet Take 20 mEq by mouth daily. 01/09/15  Yes Historical Provider, MD  oxyCODONE-acetaminophen (PERCOCET/ROXICET) 5-325 MG per tablet Take 1-2 tablets by mouth every 4 (four) hours as needed. 02/15/15  Yes Gary Fleet, PA-C  PRESCRIPTION MEDICATION Inject 1 Syringe into the muscle daily. Patient is receiving antibiotic injections at [facility] daily to help with infection in knee (unknown injection)   Yes Historical Provider, MD  TRIBENZOR 20-5-12.5 MG TABS Take 1 tablet by mouth daily. 01/19/15  Yes Historical Provider, MD  methocarbamol (ROBAXIN) 500 MG tablet Take 1 tablet (500 mg total) by mouth every 8 (eight) hours as needed for muscle spasms. Patient not  taking: Reported on 03/10/2015 02/15/15   Gary Fleet, PA-C  OxyCODONE (OXYCONTIN) 20 mg T12A 12 hr tablet Take 1 tablet (20 mg total) by mouth every 12 (twelve) hours. Patient not taking: Reported on 03/10/2015 02/18/15   Gary Fleet, PA-C   BP 150/92 mmHg  Pulse 89  Temp(Src) 98.1 F (36.7 C) (Oral)  Resp 18  Ht 5' (1.524 m)  Wt 240 lb (108.863 kg)  BMI 46.87 kg/m2  SpO2 100% Physical Exam  Constitutional: She is oriented to person, place, and time. She appears well-developed and well-nourished.  Neck: Normal range of motion.  Pulmonary/Chest: Effort normal.  Musculoskeletal:  Left knee mildly swollen. There is midline surgical incision with staples remaining in place. There is a 1-2 cm area of wound dehiscence without purulent drainage. No warmth.   Neurological: She is alert and oriented to person, place, and time.  Skin: Skin is warm and dry.    ED Course  Procedures (including critical care time) Labs Review Labs Reviewed - No data to display  Imaging Review No results found.   EKG Interpretation None      MDM   Final diagnoses:  None    1. Left knee pain  Will provide pain management. Do not feel there is any evidence of recurrent infection. She has a follow appointment in Dr. Berenice Primas' office tomorrow. Return precautions discussed.     Charlann Lange, PA-C 03/10/15 [place], MD 03/10/15 [phone]

## 2015-03-10 NOTE — ED Notes (Signed)
Pt c/o L knee pain, replacement surgery 3/11, pt does have post op infection she is being tx for and has been out of her pain medication since Thursday night and states she knows she has taken more than directed but is unable to control pain.

## 2015-08-06 ENCOUNTER — Encounter: Payer: Self-pay | Admitting: Adult Health

## 2015-08-20 IMAGING — RF DG LUMBAR SPINE 2-3V
1 series · 2 of 2 positions shown · non-contrast
Comparison: Lumbar MRI, 02/03/2014.

CLINICAL DATA: L4-L5 posterior fusion.  Operative imaging.

EXAM:
DG C-ARM 61-120 MIN; LUMBAR SPINE - 2-3 VIEW

[Series 1: run · 2 of 2 slices shown]
[im 1/2]
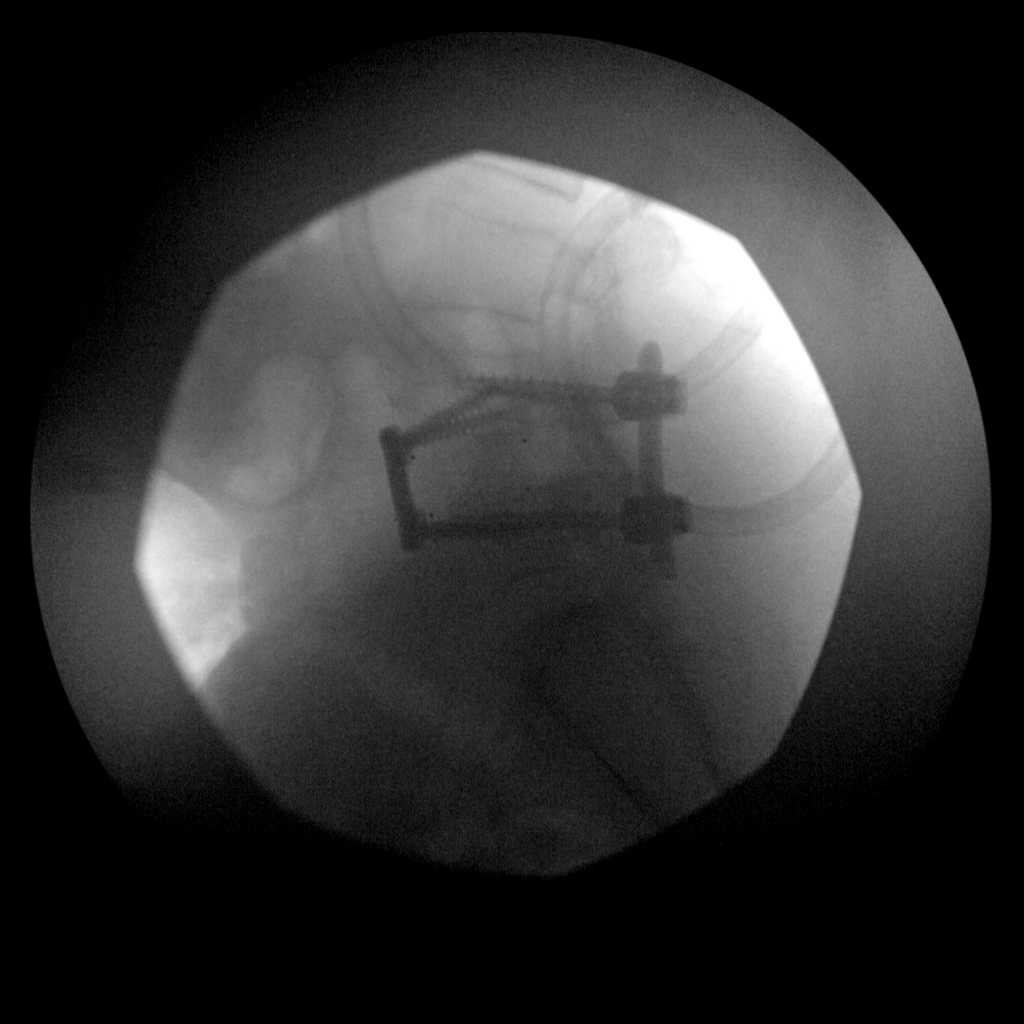
[im 2/2]
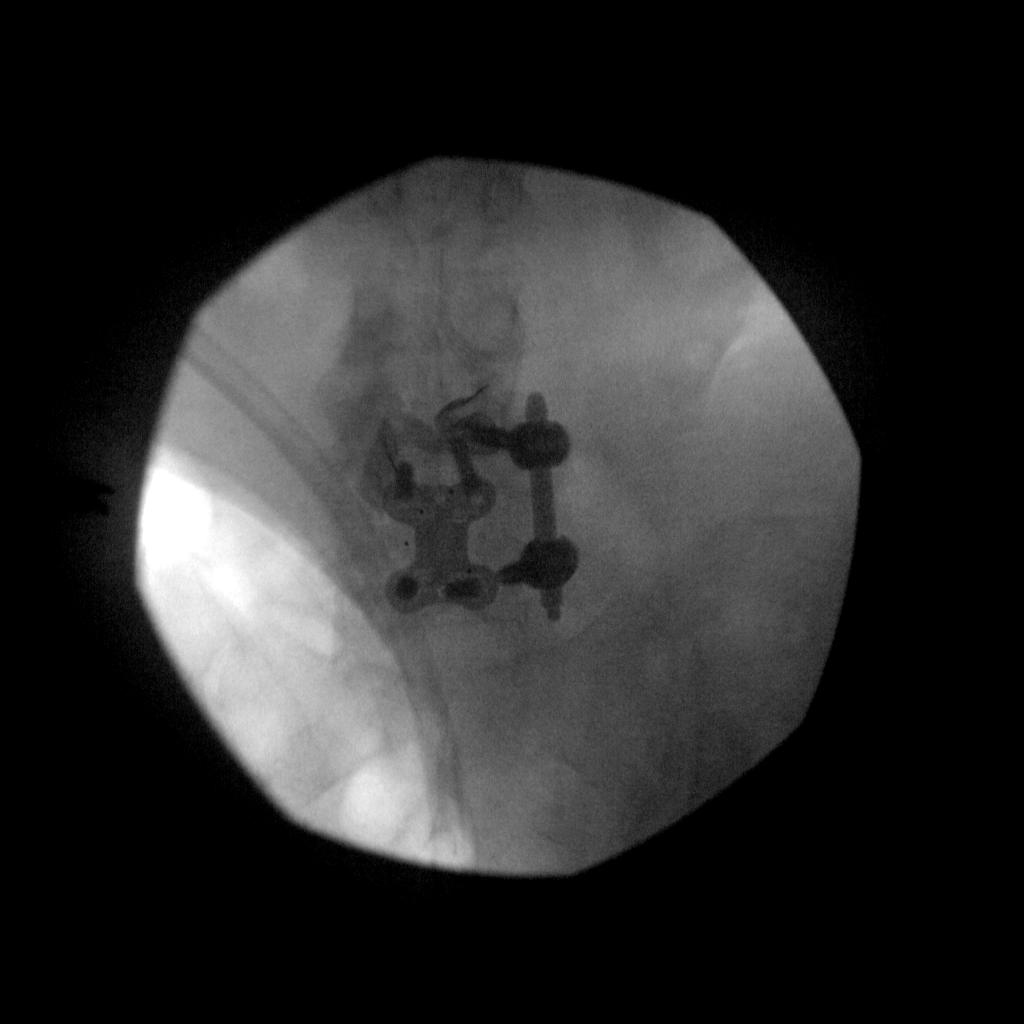

[2 of 2 positions shown; findings below may reference images not displayed]

FINDINGS: Submitted images show an anterior fusion plate and screws extending
from L4-L5 and posterior left pedicle screws and interconnecting rod
also extending from L4-L5. Radiolucent disc spacer maintains disc
height, well-centered. No acute fracture or evidence of an operative
complication.
IMPRESSION: Operative images from a L4-L5 fusion as described.

## 2015-08-21 IMAGING — CR DG CHEST 1V PORT
1 series · 1 of 1 positions shown · non-contrast
Comparison: 08/21/2014

CLINICAL DATA: Fever.  Evaluate for pneumonia.

EXAM:
PORTABLE CHEST - 1 VIEW

[AP]
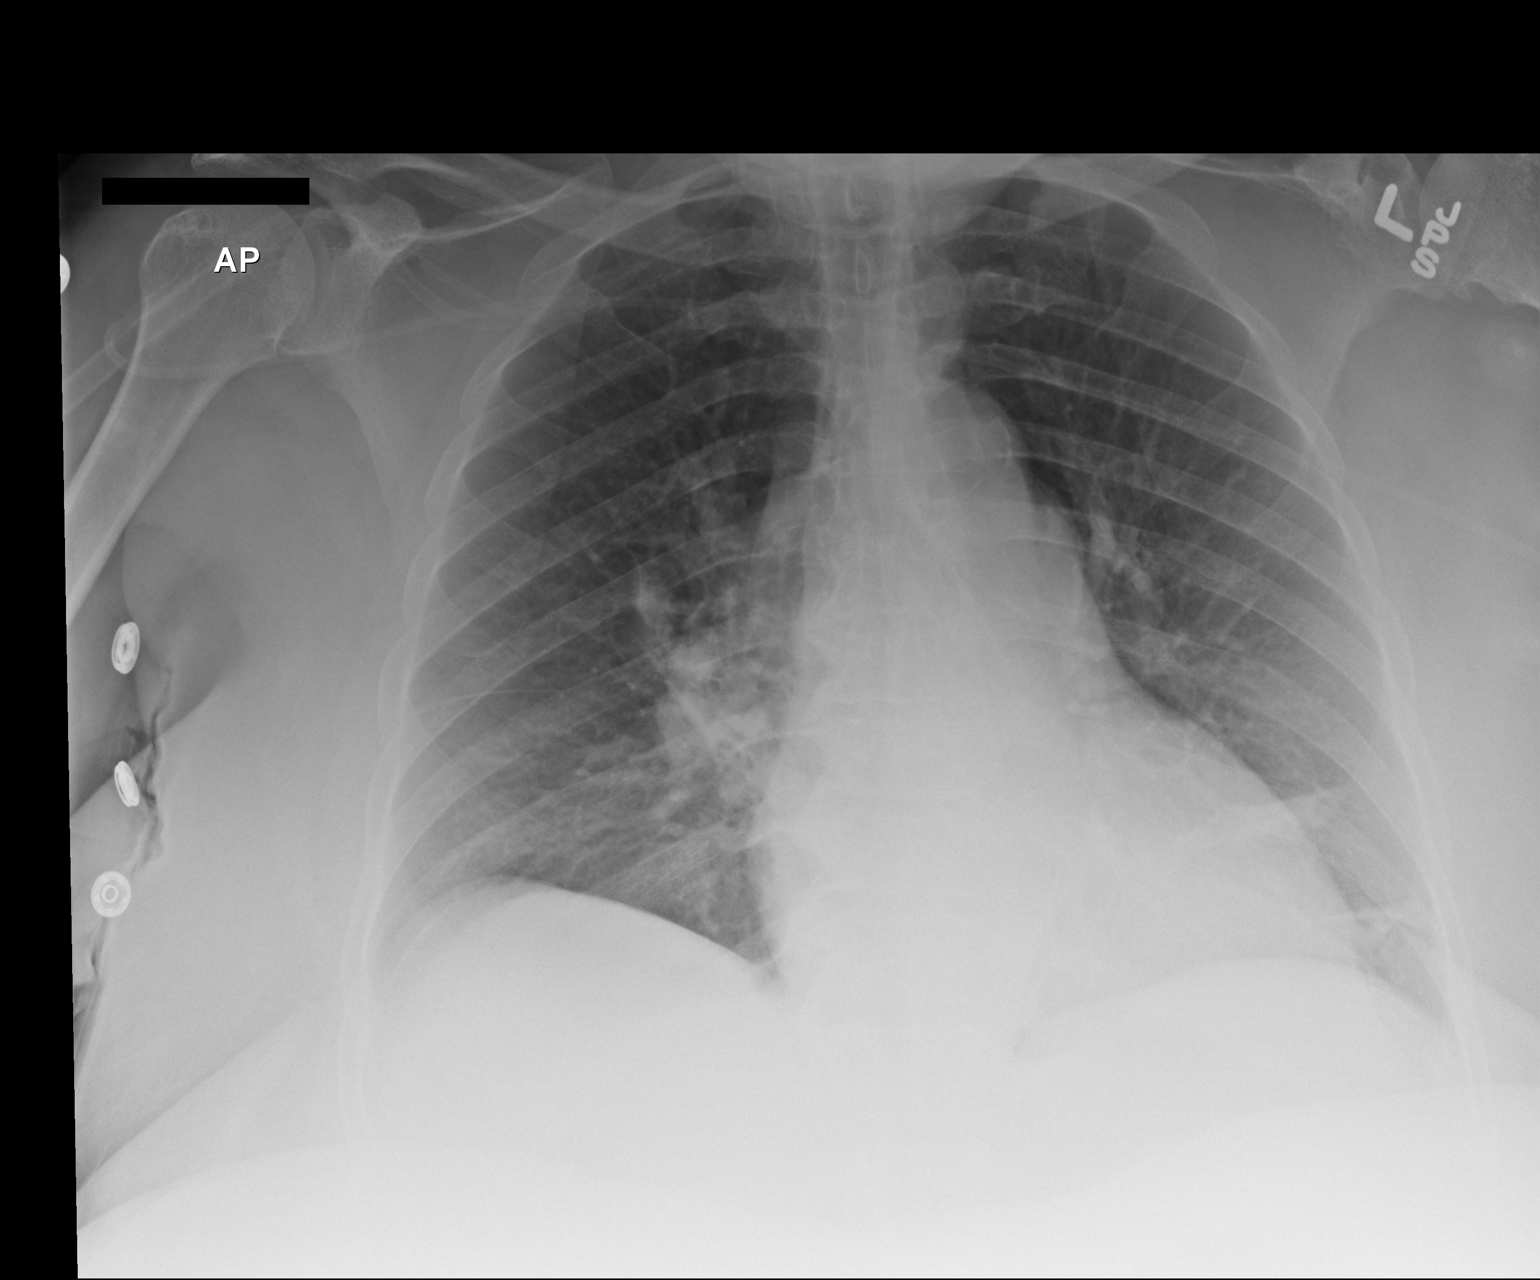

[1 of 1 positions shown; findings below may reference images not displayed]

FINDINGS: Heart is upper limits normal in size. Linear densities in the
lingula and left base, likely atelectasis. No confluent opacity on
the right. No effusions or acute bony abnormality.
IMPRESSION: Left basilar linear densities, likely atelectasis.

## 2015-11-27 ENCOUNTER — Encounter (HOSPITAL_COMMUNITY): Payer: Self-pay | Admitting: *Deleted

## 2015-11-27 ENCOUNTER — Emergency Department (HOSPITAL_COMMUNITY)
Admission: EM | Admit: 2015-11-27 | Discharge: 2015-11-27 | Disposition: A | Discharge status: Home / Self Care | Payer: Medicare Other | Attending: Emergency Medicine | Admitting: Emergency Medicine

## 2015-11-27 DIAGNOSIS — Z79891 Long term (current) use of opiate analgesic: Secondary | ICD-10-CM | POA: Diagnosis not present

## 2015-11-27 DIAGNOSIS — M1990 Unspecified osteoarthritis, unspecified site: Secondary | ICD-10-CM | POA: Diagnosis not present

## 2015-11-27 DIAGNOSIS — G629 Polyneuropathy, unspecified: Secondary | ICD-10-CM | POA: Diagnosis not present

## 2015-11-27 DIAGNOSIS — R011 Cardiac murmur, unspecified: Secondary | ICD-10-CM | POA: Insufficient documentation

## 2015-11-27 DIAGNOSIS — Z79899 Other long term (current) drug therapy: Secondary | ICD-10-CM | POA: Diagnosis not present

## 2015-11-27 DIAGNOSIS — Z792 Long term (current) use of antibiotics: Secondary | ICD-10-CM | POA: Insufficient documentation

## 2015-11-27 DIAGNOSIS — Z872 Personal history of diseases of the skin and subcutaneous tissue: Secondary | ICD-10-CM | POA: Insufficient documentation

## 2015-11-27 DIAGNOSIS — K219 Gastro-esophageal reflux disease without esophagitis: Secondary | ICD-10-CM | POA: Diagnosis not present

## 2015-11-27 DIAGNOSIS — F17210 Nicotine dependence, cigarettes, uncomplicated: Secondary | ICD-10-CM | POA: Diagnosis not present

## 2015-11-27 DIAGNOSIS — Z7952 Long term (current) use of systemic steroids: Secondary | ICD-10-CM | POA: Diagnosis not present

## 2015-11-27 DIAGNOSIS — I10 Essential (primary) hypertension: Secondary | ICD-10-CM | POA: Insufficient documentation

## 2015-11-27 DIAGNOSIS — M1000 Idiopathic gout, unspecified site: Secondary | ICD-10-CM

## 2015-11-27 DIAGNOSIS — Z8701 Personal history of pneumonia (recurrent): Secondary | ICD-10-CM | POA: Insufficient documentation

## 2015-11-27 DIAGNOSIS — M109 Gout, unspecified: Secondary | ICD-10-CM | POA: Diagnosis present

## 2015-11-27 DIAGNOSIS — Z7982 Long term (current) use of aspirin: Secondary | ICD-10-CM | POA: Diagnosis not present

## 2015-11-27 MED ORDER — COLCHICINE 0.6 MG PO TABS
0.6000 mg | ORAL_TABLET | Freq: Once | ORAL | Status: AC
  Administered 2015-11-27: 0.6 mg via ORAL
  Filled 2015-11-27 (×2): qty 1

## 2015-11-27 MED ORDER — INDOMETHACIN 25 MG PO CAPS: ORAL_CAPSULE | ORAL | Status: DC

## 2015-11-27 MED ORDER — COLCHICINE 0.6 MG PO TABS
0.6000 mg | ORAL_TABLET | Freq: Once | ORAL | Status: AC
  Administered 2015-11-27: 0.6 mg via ORAL
  Filled 2015-11-27: qty 1

## 2015-11-27 MED ORDER — KETOROLAC TROMETHAMINE 30 MG/ML IJ SOLN
30.0000 mg | Freq: Once | INTRAMUSCULAR | Status: AC
  Administered 2015-11-27: 30 mg via INTRAMUSCULAR
  Filled 2015-11-27: qty 1

## 2015-11-27 MED ORDER — COLCHICINE 0.6 MG PO TABS: 0.6000 mg | ORAL_TABLET | Freq: Two times a day (BID) | ORAL | Status: DC

## 2015-11-27 NOTE — ED Notes (Signed)
Redness and edema to rt great toe - admits to hx of gout, denies fever.

## 2015-11-27 NOTE — ED Provider Notes (Signed)
CSN: [id number]     Arrival date & time 11/27/15  0123 History  By signing my name below, I, Ellen Henry, attest that this documentation has been prepared under the direction and in the presence of Ellen Porter, MD at [phone]. Electronically Signed: Judithann Henry, ED Scribe. 11/27/2015. 3:53 AM.    Chief Complaint  Patient presents with  . Gout   The history is provided by the patient. No language interpreter was used.   HPI Comments: Ellen Henry is a 69 y.o. female with a hx of gout who presents to the Emergency Department complaining of gradually worsening constant throbbing right great toe pain and swelling onset 2 days ago that is getting worse. She explains that the pain radiates up towards her right leg. She denies any fever. She states that she was on gout medication last year but once she ran out, she did not refill it because she did not have any more flare ups. She typically only has one flare up a year. Pt does not remember which medication she was on. She reports that she smokes approx. 3 cigarettes daily. Pt currently lives with her daughter and has support.   PCP [name]    Past Medical History  Diagnosis Date  . 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 Hypertension   . Sleep apnea     does not use cpap  . Pneumonia   . H/O pyelonephritis     as a child  . GERD (gastroesophageal reflux disease)     "years ago", diet controlled  . Arthritis   . Gout   . Neuropathy ([place])   . Dry skin   . Radiculopathy    Past Surgical History  Procedure Laterality Date  . 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 Joint replacement    . Carpal tunnel release Right     release done twice  . Anterior lumbar fusion N/A 08/29/2014    Procedure: ANTERIOR LUMBAR FUSION 1 LEVEL;  Surgeon: Sinclair Ship, MD;  Location: [facility];  Service: Orthopedics;  Laterality: N/A;  Lumbar 4-5 anterior lumbar interbody fusion with allograft and instrumentation.  .  Abdominal exposure N/A 08/29/2014    Procedure: ABDOMINAL EXPOSURE;  Surgeon: Rosetta Posner, MD;  Location: [facility] OR;  Service: Vascular;  Laterality: N/A;  . Carpal tunnel release Right     x 2  . Back surgery  08/28/2014 and 08/29/2014    lumbar fusion  . Tonsillectomy    . Colonoscopy    . Appendectomy    . Abdominal hysterectomy    . Joint replacement      right knee  . Total knee arthroplasty Left 02/15/2015    Procedure: TOTAL KNEE ARTHROPLASTY;  Surgeon: [name], MD;  Location: [facility];  Service: Orthopedics;  Laterality: Left;   Family History  Problem Relation Age of Onset  . Varicose Veins Mother    Social History  Substance Use Topics  . Smoking status: Current Every Day Smoker -- 0.25 packs/day    Types: Cigarettes  . Smokeless tobacco: Never Used  . Alcohol Use: No   Lives with daughter   OB History    Gravida Para Term Preterm AB TAB SAB Ectopic Multiple Living   0 0 0 0 0 0 0 0       Review of Systems  Constitutional: Negative for fever.  Musculoskeletal: Positive for joint swelling and arthralgias.  All other systems reviewed and are negative.     Allergies  Food allergy formula  Home Medications   Prior to Admission medications   Medication Sig Start Date End Date Taking? Authorizing Provider  aspirin EC 325 MG tablet Take 1 tablet (325 mg total) by mouth 2 (two) times daily after a meal. Take x 1 month post op to decrease risk of blood clots. 02/15/15   Gary Fleet, PA-C  aspirin EC 81 MG EC tablet Take 1 tablet (81 mg total) by mouth daily. 09/04/14   Shanker [name], MD  atorvastatin (LIPITOR) 40 MG tablet Take 1 tablet (40 mg total) by mouth daily at 6 PM. 09/04/14   Jonetta Osgood, MD  atorvastatin (LIPITOR) 40 MG tablet Take 40 mg by mouth daily. 01/09/15   Historical Provider, MD  azithromycin (ZITHROMAX) 250 MG tablet Take 1 tablet (250 mg total) by mouth daily. Take first 2 tablets together, then 1 every day until finished. 10/26/14   Kristen  N Ward, DO  bethanechol (URECHOLINE) 25 MG tablet Take 1 tablet (25 mg total) by mouth 3 (three) times daily. 02/18/15   Gary Fleet, PA-C  cefpodoxime (VANTIN) 100 MG tablet Take 1 tablet (100 mg total) by mouth every 12 (twelve) hours. STOP DATE 09/06/14 09/04/14   Shanker [name], MD  cephALEXin (KEFLEX) 500 MG capsule Take 500 mg by mouth 4 (four) times daily. 03/04/15   Historical Provider, MD  colchicine 0.6 MG tablet Take 1 tablet (0.6 mg total) by mouth 2 (two) times daily. 11/27/15   Ellen Porter, MD  furosemide (LASIX) 40 MG tablet Take 40 mg by mouth daily.  01/09/15   Historical Provider, MD  gabapentin (NEURONTIN) 600 MG tablet Take 600 mg by mouth 3 (three) times daily.    Historical Provider, MD  gabapentin (NEURONTIN) 600 MG tablet Take 600 mg by mouth 3 (three) times daily. 01/09/15   Historical Provider, MD  indomethacin (INDOCIN) 25 MG capsule Take 1 po TID with food x 3 d then 1 po BID x 3d then 1 po QD x 3d 11/27/15   Ellen Porter, MD  KLOR-CON M20 20 MEQ tablet Take 20 mEq by mouth daily. 01/09/15   Historical Provider, MD  methocarbamol (ROBAXIN) 500 MG tablet Take 1 tablet (500 mg total) by mouth every 8 (eight) hours as needed for muscle spasms. Patient not taking: Reported on 03/10/2015 02/15/15   Gary Fleet, PA-C  Oxycodone HCl 20 MG TABS Take 1 tablet (20 mg total) by mouth every 6 (six) hours as needed for severe pain. 03/10/15   Charlann Lange, PA-C  OxyCODONE HCl ER 30 MG T12A Take 30 mg by mouth every 12 (twelve) hours. 09/04/14   Shanker [name], MD  oxyCODONE-acetaminophen (PERCOCET/ROXICET) 5-325 MG per tablet Take 1-2 tablets by mouth every 4 (four) hours as needed for moderate pain. 09/04/14   Shanker [name], MD  polyethylene glycol (MIRALAX / GLYCOLAX) packet Take 17 g by mouth daily. 09/04/14   Shanker [name], MD  predniSONE (DELTASONE) 20 MG tablet Take 3 tablets (60 mg total) by mouth daily. 10/26/14   Kristen N Ward, DO  PRESCRIPTION MEDICATION Inject 1 Syringe into  the muscle daily. Patient is receiving antibiotic injections at [facility] daily to help with infection in knee (unknown injection)    Historical Provider, MD  senna-docusate (SENOKOT-S) 8.6-50 MG per tablet Take 2 tablets by mouth 2 (two) times daily. 09/04/14   Shanker [name], MD  Jabier Gauss 20-5-12.5  MG TABS Take 1 tablet by mouth daily. 01/19/15   Historical Provider, MD   BP 115/78 mmHg  Pulse 88  Temp(Src) 97.9 F (36.6 C) (Oral)  Resp 18  SpO2 98%  Vital signs normal   Physical Exam  Constitutional: She is oriented to person, place, and time. She appears well-developed and well-nourished.  Non-toxic appearance. She does not appear ill. No distress.  HENT:  Head: Normocephalic and atraumatic.  Right Ear: External ear normal.  Left Ear: External ear normal.  Nose: Nose normal. No mucosal edema or rhinorrhea.  Mouth/Throat: Mucous membranes are normal. No dental abscesses or uvula swelling.  Eyes: Conjunctivae and EOM are normal.  Neck: Normal range of motion and full passive range of motion without pain.  Pulmonary/Chest: Effort normal. No respiratory distress. She has no rhonchi. She exhibits no crepitus.  Abdominal: Normal appearance.  Musculoskeletal: Normal range of motion. She exhibits edema and tenderness.  Swelling and pain to the MTP of the right great toe  Neurological: She is alert and oriented to person, place, and time. She has normal strength. No cranial nerve deficit.  Skin: Skin is warm, dry and intact. No rash noted. No erythema. No pallor.  Psychiatric: She has a normal mood and affect. Her speech is normal and behavior is normal. Her mood appears not anxious.  Nursing note and vitals reviewed.   ED Course  Procedures (including critical care time)  Medications  ketorolac (TORADOL) 30 MG/ML injection 30 mg (30 mg Intramuscular Given 11/27/15 0322)  colchicine tablet 0.6 mg (0.6 mg Oral Given 11/27/15 0322)  colchicine tablet 0.6 mg (0.6 mg Oral  Given 11/27/15 0408)    DIAGNOSTIC STUDIES: Oxygen Saturation is 98% on RA, normal by my interpretation.    COORDINATION OF CARE: 2:32 AM- Pt advised of plan for treatment and pt agrees. Pt will receive Toradol and Colchicine for the gout, the Toradol was given after reviewing her prior lab work to make sure she did not have any underlying renal disease.   3:52 AM - Pt reports that her pain is better but it is still throbbing so will repeat colchicine.   05:35 AM pt states after the second colchicine her pain is much better, feels ready to be discharged.   Patient's labs were reviewed, in March her BUN was 15, her creatinine was 0.86, her GFR was 78. Her last uric acid was 7.9 in December 2014.  MDM  elderly patient has a history of gout that happens about once a year. She reports flareup that started 2 days ago. Her pain improved with an anti-inflammatory, Toradol, injection and 2 doses of colchicine in the ED. She was discharged home on Indocin and colchicine.    Final diagnoses:  Acute idiopathic gout, unspecified site    New Prescriptions   COLCHICINE 0.6 MG TABLET    Take 1 tablet (0.6 mg total) by mouth 2 (two) times daily.   INDOMETHACIN (INDOCIN) 25 MG CAPSULE    Take 1 po TID with food x 3 d then 1 po BID x 3d then 1 po QD x 3d    Plan discharge  Ellen Porter, MD, FACEP   I personally performed the services described in this documentation, which was scribed in my presence. The recorded information has been reviewed and considered.  Ellen Porter, MD, Barbette Or, MD 11/27/15 [phone]

## 2015-11-27 NOTE — ED Notes (Signed)
Pt ambulating independently w/ steady gait on d/c in no acute distress, A&Ox4. D/c instructions reviewed w/ pt - pt denies any further questions or concerns at present. Rx given x2  

## 2015-11-27 NOTE — ED Notes (Signed)
Pt states that she has a hx of gout and that she is having a gout flare up to her rt great toe; pt states that the pain radiates from rt toe to her arms

## 2015-11-27 NOTE — Discharge Instructions (Signed)
Elevate your foot. Use heat for comfort. Look at the low purine diet to hopefully prevent future attacks. Take the medication as prescribed. Recheck by you primary care doctor if not improving in the next couple of days.     Low-Purine Diet Purines are compounds that affect the level of uric acid in your body. A low-purine diet is a diet that is low in purines. Eating a low-purine diet can prevent the level of uric acid in your body from getting too high and causing gout or kidney stones or both. WHAT DO I NEED TO KNOW ABOUT THIS DIET?  Choose low-purine foods. Examples of low-purine foods are listed in the next section.  Drink plenty of fluids, especially water. Fluids can help remove uric acid from your body. Try to drink 8-16 cups (1.9-3.8 L) a day.  Limit foods high in fat, especially saturated fat, as fat makes it harder for the body to get rid of uric acid. Foods high in saturated fat include pizza, cheese, ice cream, whole milk, fried foods, and gravies. Choose foods that are lower in fat and lean sources of protein. Use olive oil when cooking as it contains healthy fats that are not high in saturated fat.  Limit alcohol. Alcohol interferes with the elimination of uric acid from your body. If you are having a gout attack, avoid all alcohol.  Keep in mind that different people's bodies react differently to different foods. You will probably learn over time which foods do or do not affect you. If you discover that a food tends to cause your gout to flare up, avoid eating that food. You can more freely enjoy foods that do not cause problems. If you have any questions about a food item, talk to your dietitian or health care provider. WHICH FOODS ARE LOW, MODERATE, AND HIGH IN PURINES? The following is a list of foods that are low, moderate, and high in purines. You can eat any amount of the foods that are low in purines. You may be able to have small amounts of foods that are moderate in  purines. Ask your health care provider how much of a food moderate in purines you can have. Avoid foods high in purines. Grains  Foods low in purines: Enriched white bread, pasta, rice, cake, cornbread, popcorn.  Foods moderate in purines: Whole-grain breads and cereals, wheat germ, bran, oatmeal. Uncooked oatmeal. Dry wheat bran or wheat germ.  Foods high in purines: Pancakes, [place] toast, biscuits, muffins. Vegetables  Foods low in purines: All vegetables, except those that are moderate in purines.  Foods moderate in purines: Asparagus, cauliflower, spinach, mushrooms, green peas. Fruits  All fruits are low in purines. Meats and other Protein Foods  Foods low in purines: Eggs, nuts, peanut butter.  Foods moderate in purines: 80-90% lean beef, lamb, veal, pork, poultry, fish, eggs, peanut butter, nuts. Crab, lobster, oysters, and shrimp. Cooked dried beans, peas, and lentils.  Foods high in purines: Anchovies, sardines, herring, mussels, tuna, codfish, scallops, trout, and haddock. Berniece Salines. Organ meats (such as liver or kidney). Tripe. Game meat. Goose. Sweetbreads. Dairy  All dairy foods are low in purines. Low-fat and fat-free dairy products are best because they are low in saturated fat. Beverages  Drinks low in purines: Water, carbonated beverages, tea, coffee, cocoa.  Drinks moderate in purines: Soft drinks and other drinks sweetened with high-fructose corn syrup. Juices. To find whether a food or drink is sweetened with high-fructose corn syrup, look at the ingredients list.  Drinks high in purines: Alcoholic beverages (such as beer). Condiments  Foods low in purines: Salt, herbs, olives, pickles, relishes, vinegar.  Foods moderate in purines: Butter, margarine, oils, mayonnaise. Fats and Oils  Foods low in purines: All types, except gravies and sauces made with meat.  Foods high in purines: Gravies and sauces made with meat. Other Foods  Foods low in purines:  Sugars, sweets, gelatin. Cake. Soups made without meat.  Foods moderate in purines: Meat-based or fish-based soups, broths, or bouillons. Foods and drinks sweetened with high-fructose corn syrup.  Foods high in purines: High-fat desserts (such as ice cream, cookies, cakes, pies, doughnuts, and chocolate). Contact your dietitian for more information on foods that are not listed here.   This information is not intended to replace advice given to you by your health care provider. Make sure you discuss any questions you have with your health care provider.   Document Released: 03/20/2011 Document Revised: 11/28/2013 Document Reviewed: 10/30/2013 Elsevier Interactive Patient Education 2016 [place].  Gout Gout is when your joints become red, sore, and swell (inflamed). This is caused by the buildup of uric acid crystals in the joints. Uric acid is a chemical that is normally in the blood. If the level of uric acid gets too high in the blood, these crystals form in your joints and tissues. Over time, these crystals can form into masses near the joints and tissues. These masses can destroy bone and cause the bone to look misshapen (deformed). HOME CARE   Do not take aspirin for pain.  Only take medicine as told by your doctor.  Rest the joint as much as you can. When in bed, keep sheets and blankets off painful areas.  Keep the sore joints raised (elevated).  Put warm or cold packs on painful joints. Use of warm or cold packs depends on which works best for you.  Use crutches if the painful joint is in your leg.  Drink enough fluids to keep your pee (urine) clear or pale yellow. Limit alcohol, sugary drinks, and drinks with fructose in them.  Follow your diet instructions. Pay careful attention to how much protein you eat. Include fruits, vegetables, whole grains, and fat-free or low-fat milk products in your daily diet. Talk to your doctor or dietitian about the use of coffee, vitamin C,  and cherries. These may help lower uric acid levels.  Keep a healthy body weight. GET HELP RIGHT AWAY IF:   You have watery poop (diarrhea), throw up (vomit), or have any side effects from medicines.  You do not feel better in 24 hours, or you are getting worse.  Your joint becomes suddenly more tender, and you have chills or a fever. MAKE SURE YOU:   Understand these instructions.  Will watch your condition.  Will get help right away if you are not doing well or get worse.   This information is not intended to replace advice given to you by your health care provider. Make sure you discuss any questions you have with your health care provider.   Document Released: 09/01/2008 Document Revised: 12/14/2014 Document Reviewed: 07/06/2012 Elsevier Interactive Patient Education [employer].

## 2016-03-23 ENCOUNTER — Emergency Department (HOSPITAL_COMMUNITY)
Admission: EM | Admit: 2016-03-23 | Discharge: 2016-03-23 | Disposition: A | Discharge status: Home / Self Care | Payer: Medicare Other | Attending: Emergency Medicine | Admitting: Emergency Medicine

## 2016-03-23 ENCOUNTER — Encounter (HOSPITAL_COMMUNITY): Payer: Self-pay

## 2016-03-23 ENCOUNTER — Emergency Department (HOSPITAL_COMMUNITY): Payer: Medicare Other

## 2016-03-23 DIAGNOSIS — Z79899 Other long term (current) drug therapy: Secondary | ICD-10-CM | POA: Diagnosis not present

## 2016-03-23 DIAGNOSIS — Z8669 Personal history of other diseases of the nervous system and sense organs: Secondary | ICD-10-CM | POA: Insufficient documentation

## 2016-03-23 DIAGNOSIS — Z7982 Long term (current) use of aspirin: Secondary | ICD-10-CM | POA: Diagnosis not present

## 2016-03-23 DIAGNOSIS — F17210 Nicotine dependence, cigarettes, uncomplicated: Secondary | ICD-10-CM | POA: Diagnosis not present

## 2016-03-23 DIAGNOSIS — M10071 Idiopathic gout, right ankle and foot: Secondary | ICD-10-CM | POA: Insufficient documentation

## 2016-03-23 DIAGNOSIS — Z87448 Personal history of other diseases of urinary system: Secondary | ICD-10-CM | POA: Diagnosis not present

## 2016-03-23 DIAGNOSIS — M109 Gout, unspecified: Secondary | ICD-10-CM

## 2016-03-23 DIAGNOSIS — Z8701 Personal history of pneumonia (recurrent): Secondary | ICD-10-CM | POA: Insufficient documentation

## 2016-03-23 DIAGNOSIS — Z8719 Personal history of other diseases of the digestive system: Secondary | ICD-10-CM | POA: Diagnosis not present

## 2016-03-23 DIAGNOSIS — I10 Essential (primary) hypertension: Secondary | ICD-10-CM | POA: Insufficient documentation

## 2016-03-23 DIAGNOSIS — R011 Cardiac murmur, unspecified: Secondary | ICD-10-CM | POA: Insufficient documentation

## 2016-03-23 DIAGNOSIS — M1990 Unspecified osteoarthritis, unspecified site: Secondary | ICD-10-CM | POA: Insufficient documentation

## 2016-03-23 DIAGNOSIS — M79671 Pain in right foot: Secondary | ICD-10-CM | POA: Diagnosis present

## 2016-03-23 MED ORDER — ACETAMINOPHEN 500 MG PO TABS
1000.0000 mg | ORAL_TABLET | Freq: Once | ORAL | Status: AC
  Administered 2016-03-23: 1000 mg via ORAL
  Filled 2016-03-23: qty 2

## 2016-03-23 MED ORDER — COLCHICINE 0.6 MG PO TABS: 0.6000 mg | ORAL_TABLET | Freq: Every day | ORAL | Status: DC

## 2016-03-23 MED ORDER — OXYCODONE HCL 5 MG PO TABS: 2.5000 mg | ORAL_TABLET | ORAL | Status: DC | PRN

## 2016-03-23 MED ORDER — COLCHICINE 0.6 MG PO TABS
1.2000 mg | ORAL_TABLET | Freq: Once | ORAL | Status: AC
  Administered 2016-03-23: 1.2 mg via ORAL
  Filled 2016-03-23: qty 2

## 2016-03-23 MED ORDER — NAPROXEN 500 MG PO TABS
500.0000 mg | ORAL_TABLET | Freq: Once | ORAL | Status: AC
  Administered 2016-03-23: 500 mg via ORAL
  Filled 2016-03-23: qty 1

## 2016-03-23 NOTE — ED Provider Notes (Signed)
CSN: [id number]     Arrival date & time 03/23/16  [id number] History   First MD Initiated Contact with Patient 03/23/16 1136     Chief Complaint  Patient presents with  . Foot Pain     (Consider location/radiation/quality/duration/timing/severity/associated sxs/prior Treatment) Patient is a 70 y.o. female presenting with lower extremity pain. The history is provided by the patient.  Foot Pain This is a recurrent problem. The current episode started more than 2 days ago. The problem occurs constantly. The problem has not changed since onset.Pertinent negatives include no chest pain, no abdominal pain, no headaches and no shortness of breath. The symptoms are aggravated by bending and standing. Nothing relieves the symptoms. She has tried nothing for the symptoms. The treatment provided no relief.    70 yo F the chief complaints of right foot pain. This started about 3 or 4 days ago. Patient denies any injury. Denies fevers or chills. Has been getting mildly worse over the past couple days. Has tried some anti-inflammatory medicines off and on without relief. Feels like this is typical of her gout.  Past Medical History  Diagnosis Date  . 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 Hypertension   . Sleep apnea     does not use cpap  . Pneumonia   . H/O pyelonephritis     as a child  . GERD (gastroesophageal reflux disease)     "years ago", diet controlled  . Arthritis   . Gout   . Neuropathy ([place])   . Dry skin   . Radiculopathy    Past Surgical History  Procedure Laterality Date  . 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 Joint replacement    . Carpal tunnel release Right     release done twice  . Anterior lumbar fusion N/A 08/29/2014    Procedure: ANTERIOR LUMBAR FUSION 1 LEVEL;  Surgeon: Sinclair Ship, MD;  Location: [facility];  Service: Orthopedics;  Laterality: N/A;  Lumbar 4-5 anterior lumbar interbody fusion with allograft and instrumentation.  . Abdominal  exposure N/A 08/29/2014    Procedure: ABDOMINAL EXPOSURE;  Surgeon: Rosetta Posner, MD;  Location[facility];  Service: Vascular;  Laterality: N/A;  . Carpal tunnel release Right     x 2  . Back surgery  08/28/2014 and 08/29/2014    lumbar fusion  . Tonsillectomy    . Colonoscopy    . Appendectomy    . Abdominal hysterectomy    . Joint replacement      right knee  . Total knee arthroplasty Left 02/15/2015    Procedure: TOTAL KNEE ARTHROPLASTY;  Surgeon: [name], MD;  Location: [facility];  Service: Orthopedics;  Laterality: Left;   Family History  Problem Relation Age of Onset  . Varicose Veins Mother    Social History  Substance Use Topics  . Smoking status: Current Every Day Smoker -- 0.25 packs/day    Types: Cigarettes  . Smokeless tobacco: Never Used  . Alcohol Use: No   OB History    Gravida Para Term Preterm AB TAB SAB Ectopic Multiple Living   0 0 0 0 0 0 0 0       Review of Systems  Constitutional: Negative for fever and chills.  HENT: Negative for congestion and rhinorrhea.   Eyes: Negative for redness and visual disturbance.  Respiratory: Negative for shortness of breath and wheezing.   Cardiovascular: Negative for chest pain  and palpitations.  Gastrointestinal: Negative for nausea, vomiting and abdominal pain.  Genitourinary: Negative for dysuria and urgency.  Musculoskeletal: Positive for myalgias and arthralgias.  Skin: Negative for pallor and wound.  Neurological: Negative for dizziness and headaches.      Allergies  Food allergy formula  Home Medications   Prior to Admission medications   Medication Sig Start Date End Date Taking? Authorizing Provider  acetaminophen (TYLENOL) 500 MG tablet Take 1,000 mg by mouth every 6 (six) hours as needed for mild pain.    Historical Provider, MD  aspirin EC 325 MG tablet Take 1 tablet (325 mg total) by mouth 2 (two) times daily after a meal. Take x 1 month post op to decrease risk of blood clots. 02/15/15   Gary Fleet,  PA-C  aspirin EC 81 MG EC tablet Take 1 tablet (81 mg total) by mouth daily. Patient not taking: Reported on 11/27/2015 09/04/14   Jonetta Osgood, MD  atorvastatin (LIPITOR) 40 MG tablet Take 1 tablet (40 mg total) by mouth daily at 6 PM. Patient not taking: Reported on 11/27/2015 09/04/14   Jonetta Osgood, MD  azithromycin (ZITHROMAX) 250 MG tablet Take 1 tablet (250 mg total) by mouth daily. Take first 2 tablets together, then 1 every day until finished. Patient not taking: Reported on 11/27/2015 10/26/14   Delice Bison Ward, DO  bethanechol (URECHOLINE) 25 MG tablet Take 1 tablet (25 mg total) by mouth 3 (three) times daily. Patient not taking: Reported on 11/27/2015 02/18/15   Gary Fleet, PA-C  cefpodoxime (VANTIN) 100 MG tablet Take 1 tablet (100 mg total) by mouth every 12 (twelve) hours. STOP DATE 09/06/14 Patient not taking: Reported on 11/27/2015 09/04/14   Jonetta Osgood, MD  colchicine 0.6 MG tablet Take 1 tablet (0.6 mg total) by mouth 2 (two) times daily. 11/27/15   [name], MD  colchicine 0.6 MG tablet Take 1 tablet (0.6 mg total) by mouth daily. 03/23/16   Deno Etienne, DO  furosemide (LASIX) 40 MG tablet Take 40 mg by mouth daily.  01/09/15   Historical Provider, MD  indomethacin (INDOCIN) 25 MG capsule Take 1 po TID with food x 3 d then 1 po BID x 3d then 1 po QD x 3d 11/27/15   [name], MD  methocarbamol (ROBAXIN) 500 MG tablet Take 1 tablet (500 mg total) by mouth every 8 (eight) hours as needed for muscle spasms. Patient not taking: Reported on 03/10/2015 02/15/15   Gary Fleet, PA-C  oxyCODONE (ROXICODONE) 5 MG immediate release tablet Take 0.5 tablets (2.5 mg total) by mouth every 4 (four) hours as needed for severe pain. 03/23/16   Deno Etienne, DO  Oxycodone HCl 20 MG TABS Take 1 tablet (20 mg total) by mouth every 6 (six) hours as needed for severe pain. Patient not taking: Reported on 11/27/2015 03/10/15   Charlann Lange, PA-C  OxyCODONE HCl ER 30 MG T12A Take 30 mg by mouth  every 12 (twelve) hours. Patient not taking: Reported on 11/27/2015 09/04/14   Jonetta Osgood, MD  oxyCODONE-acetaminophen (PERCOCET/ROXICET) 5-325 MG per tablet Take 1-2 tablets by mouth every 4 (four) hours as needed for moderate pain. Patient not taking: Reported on 11/27/2015 09/04/14   Jonetta Osgood, MD  polyethylene glycol [facility] Floria Raveling) packet Take 17 g by mouth daily. Patient not taking: Reported on 11/27/2015 09/04/14   Jonetta Osgood, MD  predniSONE (DELTASONE) 20 MG tablet Take 3 tablets (60 mg total) by mouth daily. Patient  not taking: Reported on 11/27/2015 10/26/14   Kristen N Ward, DO  senna-docusate (SENOKOT-S) 8.6-50 MG per tablet Take 2 tablets by mouth 2 (two) times daily. Patient not taking: Reported on 11/27/2015 09/04/14   Jonetta Osgood, MD  TRIBENZOR 20-5-12.5 MG TABS Take 1 tablet by mouth daily. 01/19/15   Historical Provider, MD   BP 144/94 mmHg  Pulse 81  Temp(Src) 97.7 F (36.5 C) (Oral)  Resp 16  SpO2 100% Physical Exam  Constitutional: She is oriented to person, place, and time. She appears well-developed and well-nourished. No distress.  HENT:  Head: Normocephalic and atraumatic.  Eyes: EOM are normal. Pupils are equal, round, and reactive to light.  Neck: Normal range of motion. Neck supple.  Cardiovascular: Normal rate and regular rhythm.  Exam reveals no gallop and no friction rub.   No murmur heard. Pulmonary/Chest: Effort normal. She has no wheezes. She has no rales.  Abdominal: Soft. She exhibits no distension. There is no tenderness.  Musculoskeletal: She exhibits edema and tenderness.  Mild erythema to the dorsum of the right foot. Mild tenderness as well to the first MTP. No noted warmth compared to the other side.  Neurological: She is alert and oriented to person, place, and time.  Skin: Skin is warm and dry. She is not diaphoretic.  Psychiatric: She has a normal mood and affect. Her behavior is normal.  Nursing note and vitals  reviewed.   ED Course  Procedures (including critical care time) Labs Review Labs Reviewed - No data to display  Imaging Review Dg Foot Complete Right  03/23/2016  CLINICAL DATA:  New right foot pain and swelling. No known trauma. History of gout. EXAM: RIGHT FOOT COMPLETE - 3+ VIEW COMPARISON:  None. 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 Small plantar calcaneal spur. The remainder of the right foot appears normal. No bone erosions or tophi to suggest gout. IMPRESSION: No acute abnormality. Osteoarthritis of the first metatarsal phalangeal joint. Electronically Signed   By: Lorriane Shire M.D.   On: 03/23/2016 10:52   I have personally reviewed and evaluated these images and lab results as part of my medical decision-making.   EKG Interpretation None      MDM   Final diagnoses:  Acute gout of right foot, unspecified cause   70 yo F With an acute gout exacerbation. Feels just like her prior gout. Denies injury. X-rays negative for acute fracture. We'll treat her as gout. Given the difficulty seen in the ED will give her 1 more tablet to fill. PCP follow-up.  12:12 PM:  I have discussed the diagnosis/risks/treatment options with the patient and believe the pt to be eligible for discharge home to follow-up with PCP. We also discussed returning to the ED immediately if new or worsening sx occur. We discussed the sx which are most concerning (e.g., sudden worsening pain, fever, inability to tolerate by mouth) that necessitate immediate return. Medications administered to the patient during their visit and any new prescriptions provided to the patient are listed below.  Medications given during this visit Medications  acetaminophen (TYLENOL) tablet 1,000 mg (1,000 mg Oral Given 03/23/16 1204)  colchicine tablet 1.2 mg (1.2 mg Oral Given 03/23/16 1205)  naproxen (NAPROSYN) tablet 500 mg (500 mg Oral Given 03/23/16 1204)    New Prescriptions    COLCHICINE 0.6 MG TABLET    Take 1 tablet (0.6 mg total) by mouth daily.   OXYCODONE (ROXICODONE) 5 MG IMMEDIATE RELEASE TABLET  Take 0.5 tablets (2.5 mg total) by mouth every 4 (four) hours as needed for severe pain.    The patient appears reasonably screen and/or stabilized for discharge and I doubt any other medical condition or other [facility] requiring further screening, evaluation, or treatment in the ED at this time prior to discharge.     Deno Etienne, DO 03/23/16 1212

## 2016-03-23 NOTE — Discharge Instructions (Signed)
Take two naproxyn tablets twice a day.  Gout Gout is an inflammatory arthritis caused by a buildup of uric acid crystals in the joints. Uric acid is a chemical that is normally present in the blood. When the level of uric acid in the blood is too high it can form crystals that deposit in your joints and tissues. This causes joint redness, soreness, and swelling (inflammation). Repeat attacks are common. Over time, uric acid crystals can form into masses (tophi) near a joint, destroying bone and causing disfigurement. Gout is treatable and often preventable. CAUSES  The disease begins with elevated levels of uric acid in the blood. Uric acid is produced by your body when it breaks down a naturally found substance called purines. Certain foods you eat, such as meats and fish, contain high amounts of purines. Causes of an elevated uric acid level include:  Being passed down from parent to child (heredity).  Diseases that cause increased uric acid production (such as obesity, psoriasis, and certain cancers).  Excessive alcohol use.  Diet, especially diets rich in meat and seafood.  Medicines, including certain cancer-fighting medicines (chemotherapy), water pills (diuretics), and aspirin.  Chronic kidney disease. The kidneys are no longer able to remove uric acid well.  Problems with metabolism. Conditions strongly associated with gout include:  Obesity.  High blood pressure.  High cholesterol.  Diabetes. Not everyone with elevated uric acid levels gets gout. It is not understood why some people get gout and others do not. Surgery, joint injury, and eating too much of certain foods are some of the factors that can lead to gout attacks. SYMPTOMS   An attack of gout comes on quickly. It causes intense pain with redness, swelling, and warmth in a joint.  Fever can occur.  Often, only one joint is involved. Certain joints are more commonly involved:  Base of the big  toe.  Knee.  Ankle.  Wrist.  Finger. Without treatment, an attack usually goes away in a few days to weeks. Between attacks, you usually will not have symptoms, which is different from many other forms of arthritis. DIAGNOSIS  Your caregiver will suspect gout based on your symptoms and exam. In some cases, tests may be recommended. The tests may include:  Blood tests.  Urine tests.  X-rays.  Joint fluid exam. This exam requires a needle to remove fluid from the joint (arthrocentesis). Using a microscope, gout is confirmed when uric acid crystals are seen in the joint fluid. TREATMENT  There are two phases to gout treatment: treating the sudden onset (acute) attack and preventing attacks (prophylaxis).  Treatment of an Acute Attack.  Medicines are used. These include anti-inflammatory medicines or steroid medicines.  An injection of steroid medicine into the affected joint is sometimes necessary.  The painful joint is rested. Movement can worsen the arthritis.  You may use warm or cold treatments on painful joints, depending which works best for you.  Treatment to Prevent Attacks.  If you suffer from frequent gout attacks, your caregiver may advise preventive medicine. These medicines are started after the acute attack subsides. These medicines either help your kidneys eliminate uric acid from your body or decrease your uric acid production. You may need to stay on these medicines for a very long time.  The early phase of treatment with preventive medicine can be associated with an increase in acute gout attacks. For this reason, during the first few months of treatment, your caregiver may also advise you to take medicines usually  used for acute gout treatment. Be sure you understand your caregiver's directions. Your caregiver may make several adjustments to your medicine dose before these medicines are effective.  Discuss dietary treatment with your caregiver or dietitian.  Alcohol and drinks high in sugar and fructose and foods such as meat, poultry, and seafood can increase uric acid levels. Your caregiver or dietitian can advise you on drinks and foods that should be limited. HOME CARE INSTRUCTIONS   Do not take aspirin to relieve pain. This raises uric acid levels.  Only take over-the-counter or prescription medicines for pain, discomfort, or fever as directed by your caregiver.  Rest the joint as much as possible. When in bed, keep sheets and blankets off painful areas.  Keep the affected joint raised (elevated).  Apply warm or cold treatments to painful joints. Use of warm or cold treatments depends on which works best for you.  Use crutches if the painful joint is in your leg.  Drink enough fluids to keep your urine clear or pale yellow. This helps your body get rid of uric acid. Limit alcohol, sugary drinks, and fructose drinks.  Follow your dietary instructions. Pay careful attention to the amount of protein you eat. Your daily diet should emphasize fruits, vegetables, whole grains, and fat-free or low-fat milk products. Discuss the use of coffee, vitamin C, and cherries with your caregiver or dietitian. These may be helpful in lowering uric acid levels.  Maintain a healthy body weight. SEEK MEDICAL CARE IF:   You develop diarrhea, vomiting, or any side effects from medicines.  You do not feel better in 24 hours, or you are getting worse. SEEK IMMEDIATE MEDICAL CARE IF:   Your joint becomes suddenly more tender, and you have chills or a fever. MAKE SURE YOU:   Understand these instructions.  Will watch your condition.  Will get help right away if you are not doing well or get worse.   This information is not intended to replace advice given to you by your health care provider. Make sure you discuss any questions you have with your health care provider.   Document Released: 11/20/2000 Document Revised: 12/14/2014 Document Reviewed:  07/06/2012 Elsevier Interactive Patient Education [employer].

## 2016-03-23 NOTE — ED Notes (Signed)
Pt c/o R foot pain and swelling x 3 days.  Pain score 10/10.  Pt reports taking OTC medications w/o relief.  Denies injury.  Hx of gout.  Sts she has not taken gout related medication x "over a year."

## 2016-12-15 ENCOUNTER — Emergency Department (HOSPITAL_COMMUNITY): Payer: Medicare Other

## 2016-12-15 ENCOUNTER — Inpatient Hospital Stay (HOSPITAL_COMMUNITY)
Admission: EM | Admit: 2016-12-15 | Discharge: 2016-12-17 | DRG: 065 | Disposition: A | Discharge status: Skilled Nursing Facility | Payer: Medicare Other | Attending: Internal Medicine | Admitting: Internal Medicine

## 2016-12-15 ENCOUNTER — Encounter (HOSPITAL_COMMUNITY): Payer: Self-pay | Admitting: Emergency Medicine

## 2016-12-15 DIAGNOSIS — R531 Weakness: Secondary | ICD-10-CM

## 2016-12-15 DIAGNOSIS — E785 Hyperlipidemia, unspecified: Secondary | ICD-10-CM | POA: Diagnosis not present

## 2016-12-15 DIAGNOSIS — Z96653 Presence of artificial knee joint, bilateral: Secondary | ICD-10-CM | POA: Diagnosis present

## 2016-12-15 DIAGNOSIS — G629 Polyneuropathy, unspecified: Secondary | ICD-10-CM | POA: Diagnosis present

## 2016-12-15 DIAGNOSIS — R05 Cough: Secondary | ICD-10-CM | POA: Diagnosis not present

## 2016-12-15 DIAGNOSIS — R29701 NIHSS score 1: Secondary | ICD-10-CM | POA: Diagnosis not present

## 2016-12-15 DIAGNOSIS — E876 Hypokalemia: Secondary | ICD-10-CM

## 2016-12-15 DIAGNOSIS — I6330 Cerebral infarction due to thrombosis of unspecified cerebral artery: Secondary | ICD-10-CM

## 2016-12-15 DIAGNOSIS — M6281 Muscle weakness (generalized): Secondary | ICD-10-CM | POA: Diagnosis not present

## 2016-12-15 DIAGNOSIS — Z23 Encounter for immunization: Secondary | ICD-10-CM

## 2016-12-15 DIAGNOSIS — G8929 Other chronic pain: Secondary | ICD-10-CM | POA: Diagnosis not present

## 2016-12-15 DIAGNOSIS — I10 Essential (primary) hypertension: Secondary | ICD-10-CM | POA: Diagnosis present

## 2016-12-15 DIAGNOSIS — F17210 Nicotine dependence, cigarettes, uncomplicated: Secondary | ICD-10-CM | POA: Diagnosis present

## 2016-12-15 DIAGNOSIS — I639 Cerebral infarction, unspecified: Principal | ICD-10-CM | POA: Diagnosis present

## 2016-12-15 DIAGNOSIS — G8194 Hemiplegia, unspecified affecting left nondominant side: Secondary | ICD-10-CM | POA: Diagnosis not present

## 2016-12-15 DIAGNOSIS — R1310 Dysphagia, unspecified: Secondary | ICD-10-CM | POA: Diagnosis present

## 2016-12-15 DIAGNOSIS — M5416 Radiculopathy, lumbar region: Secondary | ICD-10-CM | POA: Diagnosis present

## 2016-12-15 DIAGNOSIS — Z7982 Long term (current) use of aspirin: Secondary | ICD-10-CM | POA: Diagnosis not present

## 2016-12-15 DIAGNOSIS — G4733 Obstructive sleep apnea (adult) (pediatric): Secondary | ICD-10-CM | POA: Diagnosis present

## 2016-12-15 DIAGNOSIS — R29818 Other symptoms and signs involving the nervous system: Secondary | ICD-10-CM | POA: Diagnosis not present

## 2016-12-15 DIAGNOSIS — Z6834 Body mass index (BMI) 34.0-34.9, adult: Secondary | ICD-10-CM

## 2016-12-15 DIAGNOSIS — K219 Gastro-esophageal reflux disease without esophagitis: Secondary | ICD-10-CM | POA: Diagnosis present

## 2016-12-15 DIAGNOSIS — R0602 Shortness of breath: Secondary | ICD-10-CM | POA: Diagnosis not present

## 2016-12-15 DIAGNOSIS — D72820 Lymphocytosis (symptomatic): Secondary | ICD-10-CM | POA: Diagnosis not present

## 2016-12-15 DIAGNOSIS — Z90710 Acquired absence of both cervix and uterus: Secondary | ICD-10-CM

## 2016-12-15 DIAGNOSIS — R404 Transient alteration of awareness: Secondary | ICD-10-CM | POA: Diagnosis not present

## 2016-12-15 HISTORY — DX: Cerebral infarction, unspecified: I63.9

## 2016-12-15 LAB — COMPREHENSIVE METABOLIC PANEL
ALT: 15 U/L (ref 14–54)
AST: 21 U/L (ref 15–41)
Albumin: 3.4 g/dL — ABNORMAL LOW (ref 3.5–5.0)
Alkaline Phosphatase: 110 U/L (ref 38–126)
Anion gap: 12 (ref 5–15)
BUN: 9 mg/dL (ref 6–20)
CO2: 27 mmol/L (ref 22–32)
Calcium: 9.1 mg/dL (ref 8.9–10.3)
Chloride: 107 mmol/L (ref 101–111)
Creatinine, Ser: 0.79 mg/dL (ref 0.44–1.00)
GFR calc Af Amer: >60 mL/min (ref >60)
GFR calc non Af Amer: >60 mL/min (ref >60)
Glucose, Bld: 105 mg/dL — ABNORMAL HIGH (ref 65–99)
Potassium: 2.9 mmol/L — ABNORMAL LOW (ref 3.5–5.1)
Sodium: 146 mmol/L — ABNORMAL HIGH (ref 135–145)
Total Bilirubin: 0.3 mg/dL (ref 0.3–1.2)
Total Protein: 6.8 g/dL (ref 6.5–8.1)

## 2016-12-15 LAB — DIFFERENTIAL
Basophils Absolute: 0 10*3/uL (ref 0.0–0.1)
Basophils Relative: 0 %
Eosinophils Absolute: 0.2 10*3/uL (ref 0.0–0.7)
Eosinophils Relative: 2 %
Lymphocytes Relative: 58 %
Lymphs Abs: 6.4 10*3/uL — ABNORMAL HIGH (ref 0.7–4.0)
Monocytes Absolute: 0.7 10*3/uL (ref 0.1–1.0)
Monocytes Relative: 6 %
Neutro Abs: 3.7 10*3/uL (ref 1.7–7.7)
Neutrophils Relative %: 34 %

## 2016-12-15 LAB — APTT: aPTT: 37 seconds — ABNORMAL HIGH (ref 24–36)

## 2016-12-15 LAB — CBG MONITORING, ED: Glucose-Capillary: 103 mg/dL — ABNORMAL HIGH (ref 65–99)

## 2016-12-15 LAB — CBC
HCT: 35.8 % — ABNORMAL LOW (ref 36.0–46.0)
Hemoglobin: 12.1 g/dL (ref 12.0–15.0)
MCH: 27.9 pg (ref 26.0–34.0)
MCHC: 33.8 g/dL (ref 30.0–36.0)
MCV: 82.5 fL (ref 78.0–100.0)
Platelets: 227 10*3/uL (ref 150–400)
RBC: 4.34 MIL/uL (ref 3.87–5.11)
RDW: 14.6 % (ref 11.5–15.5)
WBC: 11 10*3/uL — ABNORMAL HIGH (ref 4.0–10.5)

## 2016-12-15 LAB — RAPID URINE DRUG SCREEN, HOSP PERFORMED
Amphetamines: NOT DETECTED
Barbiturates: NOT DETECTED
Benzodiazepines: NOT DETECTED
Cocaine: NOT DETECTED
Opiates: NOT DETECTED
Tetrahydrocannabinol: NOT DETECTED

## 2016-12-15 LAB — I-STAT CHEM 8, ED
BUN: 10 mg/dL (ref 6–20)
Calcium, Ion: 1.08 mmol/L — ABNORMAL LOW (ref 1.15–1.40)
Chloride: 106 mmol/L (ref 101–111)
Creatinine, Ser: 0.8 mg/dL (ref 0.44–1.00)
Glucose, Bld: 107 mg/dL — ABNORMAL HIGH (ref 65–99)
HCT: 36 % (ref 36.0–46.0)
Hemoglobin: 12.2 g/dL (ref 12.0–15.0)
Potassium: 2.8 mmol/L — ABNORMAL LOW (ref 3.5–5.1)
Sodium: 145 mmol/L (ref 135–145)
TCO2: 28 mmol/L (ref 0–100)

## 2016-12-15 LAB — I-STAT TROPONIN, ED: Troponin i, poc: 0 ng/mL (ref 0.00–0.08)

## 2016-12-15 LAB — URINALYSIS, ROUTINE W REFLEX MICROSCOPIC
Bilirubin Urine: NEGATIVE
Glucose, UA: NEGATIVE mg/dL
Hgb urine dipstick: NEGATIVE
Ketones, ur: NEGATIVE mg/dL
Leukocytes, UA: NEGATIVE
Nitrite: NEGATIVE
Protein, ur: NEGATIVE mg/dL
Specific Gravity, Urine: 1.01 (ref 1.005–1.030)
pH: 7 (ref 5.0–8.0)

## 2016-12-15 LAB — PROTIME-INR
INR: 1.01
Prothrombin Time: 13.3 seconds (ref 11.4–15.2)

## 2016-12-15 LAB — ETHANOL: Alcohol, Ethyl (B): <5 mg/dL (ref <5)

## 2016-12-15 MED ORDER — SODIUM CHLORIDE 0.9 % IV SOLN
30.0000 meq | Freq: Once | INTRAVENOUS | Status: AC
  Administered 2016-12-16: 30 meq via INTRAVENOUS
  Filled 2016-12-15: qty 15

## 2016-12-15 MED ORDER — IOPAMIDOL (ISOVUE-370) INJECTION 76%
INTRAVENOUS | Status: AC
  Administered 2016-12-16: 90 mL via INTRAVENOUS
  Filled 2016-12-15: qty 100

## 2016-12-15 MED ORDER — POTASSIUM CHLORIDE CRYS ER 20 MEQ PO TBCR: 30.0000 meq | EXTENDED_RELEASE_TABLET | Freq: Once | ORAL | Status: DC

## 2016-12-15 NOTE — ED Notes (Signed)
As RN attempted to complete swallow screen, pt began yawning

## 2016-12-15 NOTE — Progress Notes (Addendum)
Called back to ED for reactivation of Code Stroke at 22:45. Nurse was performing swallow screen, the patient started yawning, eyes started watering, left arm became flaccid and left facial droop was noted. LLE also with recurrent weakness.   Neurological Exam:  Ment: Cognition and speech unchanged.  CN: Left facial droop seen, new since prior exam.  Motor: LUE 2-5. LLE 2/5. RUE and RLE 4+/5. Left sided findings are new since prior exam.  A/R: 1. Reactivated Code Stroke for recurrent left sided weakness and facial droop.  2. Exam and symptoms best localize to the right MCA territory. 3. STAT follow up CT with CTA and CT perfusion ordered. May be an endovascular candidate.   40 minutes of critical care time spent in the acute assessment and management of this acute stroke patient.   Electronically signed: Dr. Kerney Elbe

## 2016-12-15 NOTE — Consult Note (Signed)
Referring Physician: Dr. Johnney Killian    Chief Complaint: New onset of diffuse weakness  HPI: Ellen Henry is an 71 y.o. female who presented for acute onset of stuttering TIA symptoms starting at 3 PM today. She was at home in usual state of health when she suddenly became diffusely weak. She denies that she had lateralized weakness. The diffuse weakness made it difficult for her to get back into bed to rest. Her symptoms resolved but then recurred; she called EMS who noted left facial droop and left facial weakness, which had resolved per ED staff at time of presentation to [facility].   LSN: 3:00 PM.  TOSO: 3:00 PM tPA Given: No: Due to being out of IV tPA time window.  NIHSS: 1  Past Medical History:  Diagnosis Date  . Arthritis   . Dry skin   . GERD (gastroesophageal reflux disease)    "years ago", diet controlled  . Gout   . H/O pyelonephritis    as a child  . 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 Hypertension   . Neuropathy ([place])   . Pneumonia   . Radiculopathy   . Sleep apnea    does not use cpap    Past Surgical History:  Procedure Laterality Date  . ABDOMINAL EXPOSURE N/A 08/29/2014   Procedure: ABDOMINAL EXPOSURE;  Surgeon: Rosetta Posner, MD;  Location: [place];  Service: Vascular;  Laterality: N/A;  . ABDOMINAL HYSTERECTOMY    . ANTERIOR LUMBAR FUSION N/A 08/29/2014   Procedure: ANTERIOR LUMBAR FUSION 1 LEVEL;  Surgeon: Sinclair Ship, MD;  Location: [place];  Service: Orthopedics;  Laterality: N/A;  Lumbar 4-5 anterior lumbar interbody fusion with allograft and instrumentation.  . APPENDECTOMY    . BACK SURGERY  08/28/2014 and 08/29/2014   lumbar fusion  . CARPAL TUNNEL RELEASE Right    release done twice  . CARPAL TUNNEL RELEASE Right    x 2  . COLONOSCOPY    . JOINT REPLACEMENT    . JOINT REPLACEMENT     right knee  . REPLACEMENT TOTAL KNEE  2010   rigth knee  . TONSILLECTOMY    . TOTAL KNEE ARTHROPLASTY Left 02/15/2015   Procedure: TOTAL KNEE  ARTHROPLASTY;  Surgeon: [name], MD;  Location: [place];  Service: Orthopedics;  Laterality: Left;    Family History  Problem Relation Age of Onset  . Varicose Veins Mother    Social History:  reports that she has been smoking Cigarettes.  She has been smoking about 0.25 packs per day. She has never used smokeless tobacco. She reports that she does not drink alcohol or use drugs.  Allergies:  Allergies  Allergen Reactions  . Food Allergy Formula Other (See Comments)    Shellfish-banana-beef-flares up her gout    Medications:  acetaminophen (TYLENOL) 500 MG tablet Take 1,000 mg by mouth every 6 (six) hours as needed for mild pain. Historical Provider, MD Needs Review  aspirin EC 325 MG tablet Take 1 tablet (325 mg total) by mouth 2 (two) times daily after a meal. Take x 1 month post op to decrease risk of blood clots. Gary Fleet, PA-C Needs Review  aspirin EC 81 MG EC tablet Take 1 tablet (81 mg total) by mouth daily. Jonetta Osgood, MD Needs Review   Patient not taking: Reported on 11/27/2015    atorvastatin (LIPITOR) 40 MG tablet Take 1 tablet (40 mg total) by mouth daily at 6 PM. [employer]  [name], MD Needs Review   Patient not taking: Reported on 11/27/2015    azithromycin (ZITHROMAX) 250 MG tablet Take 1 tablet (250 mg total) by mouth daily. Take first 2 tablets together, then 1 every day until finished. [place], DO Needs Review   Patient not taking: Reported on 11/27/2015    bethanechol (URECHOLINE) 25 MG tablet Take 1 tablet (25 mg total) by mouth 3 (three) times daily. Gary Fleet, PA-C Needs Review   Patient not taking: Reported on 11/27/2015    cefpodoxime (VANTIN) 100 MG tablet Take 1 tablet (100 mg total) by mouth every 12 (twelve) hours. STOP DATE 09/06/14 Jonetta Osgood, MD Needs Review   Patient not taking: Reported on 11/27/2015    colchicine 0.6 MG tablet Take 1 tablet (0.6 mg total) by mouth 2 (two) times daily. [name], MD Needs Review  colchicine  0.6 MG tablet Take 1 tablet (0.6 mg total) by mouth daily. Deno Etienne, DO Needs Review  furosemide (LASIX) 40 MG tablet Take 40 mg by mouth daily.  Historical Provider, MD Needs Review  indomethacin (INDOCIN) 25 MG capsule Take 1 po TID with food x 3 d then 1 po BID x 3d then 1 po QD x 3d [name], MD Needs Review  methocarbamol (ROBAXIN) 500 MG tablet Take 1 tablet (500 mg total) by mouth every 8 (eight) hours as needed for muscle spasms. Gary Fleet, PA-C Needs Review   Patient not taking: Reported on 03/10/2015    oxyCODONE (ROXICODONE) 5 MG immediate release tablet Take 0.5 tablets (2.5 mg total) by mouth every 4 (four) hours as needed for severe pain. Deno Etienne, DO Needs Review  Oxycodone HCl 20 MG TABS Take 1 tablet (20 mg total) by mouth every 6 (six) hours as needed for severe pain. Charlann Lange, PA-C Needs Review   Patient not taking: Reported on 11/27/2015    OxyCODONE HCl ER 30 MG T12A Take 30 mg by mouth every 12 (twelve) hours. Jonetta Osgood, MD Needs Review   Patient not taking: Reported on 11/27/2015    oxyCODONE-acetaminophen (PERCOCET/ROXICET) 5-325 MG per tablet Take 1-2 tablets by mouth every 4 (four) hours as needed for moderate pain. Jonetta Osgood, MD Needs Review   Patient not taking: Reported on 11/27/2015    polyethylene glycol (MIRALAX / GLYCOLAX) packet Take 17 g by mouth daily. Jonetta Osgood, MD Needs Review   Patient not taking: Reported on 11/27/2015    predniSONE (DELTASONE) 20 MG tablet Take 3 tablets (60 mg total) by mouth daily. [employer], DO Needs Review   Patient not taking: Reported on 11/27/2015    senna-docusate (SENOKOT-S) 8.6-50 MG per tablet Take 2 tablets by mouth 2 (two) times daily. Jonetta Osgood, MD Needs Review   Patient not taking: Reported on 11/27/2015    TRIBENZOR 20-5-12.5 MG TABS Take 1 tablet by mouth daily. Historical Provider, MD Needs Review    ROS: As per HPI. Denies focal weakness.   Physical Examination: BP (!)  172/114 (BP Location: Left Arm)   Pulse 77   Temp 98.5 F (36.9 C) (Oral)   Resp 18   Ht 5' (1.524 m)   Wt 104.3 kg (230 lb)   SpO2 100%   BMI 44.92 kg/m   HEENT: [place]/AT Lungs: No gross wheezing. Respirations unlabored Abdomen: Morbidly obese Ext: Warm and well-perfused  Neurologic Examination: Ment: Alert and oriented. Speech fluent with intact comprehension. Normal insight. Not agitated.  CN: PERRL. Visual fields  intact. EOMI without nystagmus. V - normal temp sensation. VI with subtle asymmetry but not classifiable as facial droop. VIII hearing intact to questions and commands. IX/X - phonation intact. XI - no asymmetry. XII - tongue midline. Motor: 4+/5 in all 4 extremities proximally and distally. Wavers with elevation of LLE and LUE but without loss of height of limb after 5 and 10 seconds respectively.  Sensory: Decreased temperature sensation to LUE. Otherwise normal temp sensation. FT normal x 4 without extinction.  Reflexes: 2+ achilles, 1+ patellae, 3+ brachioradialis and biceps bilaterally. Toes downgoing.  Cerebellar: No ataxia with FNF bilaterally.  Gait: Deferred in context of acute stroke evaluation.   Results for orders placed or performed during the hospital encounter of 12/15/16 (from the past 48 hour(s))  CBG monitoring, ED     Status: Abnormal   Collection Time: 12/15/16  9:01 PM  Result Value Ref Range   Glucose-Capillary 103 (H) 65 - 99 mg/dL   Comment 1 Notify RN    Comment 2 Document in Chart   I-stat troponin, ED (not at [facility])     Status: None   Collection Time: 12/15/16  9:09 PM  Result Value Ref Range   Troponin i, poc 0.00 0.00 - 0.08 ng/mL   Comment 3            Comment: Due to the release kinetics of cTnI, a negative result within the first hours of the onset of symptoms does not rule out myocardial infarction with certainty. If myocardial infarction is still suspected, repeat the test at appropriate intervals.   I-Stat Chem 8, ED  (not  at [facility])     Status: Abnormal   Collection Time: 12/15/16  9:11 PM  Result Value Ref Range   Sodium 145 135 - 145 mmol/L   Potassium 2.8 (L) 3.5 - 5.1 mmol/L   Chloride 106 101 - 111 mmol/L   BUN 10 6 - 20 mg/dL   Creatinine, Ser 0.80 0.44 - 1.00 mg/dL   Glucose, Bld 107 (H) 65 - 99 mg/dL   Calcium, Ion 1.08 (L) 1.15 - 1.40 mmol/L   TCO2 28 0 - 100 mmol/L   Hemoglobin 12.2 12.0 - 15.0 g/dL   HCT 36.0 36.0 - 46.0 %   No results found.  Assessment: 71 y.o. female with onset of diffuse weakness at 3 PM. EMS noted left sided weakness with facial droop. No facial droop or lateralized weakness noted on arrival to ED.  1. Neurological exam reveals no lateralized abnormality except for decreased temperature sensation to LUE.  2. CT head negative for acute changes. Interval appearance of several chronic lacunar infarctions relative to prior CT head.  3. Stroke Risk Factors - HTN. 4. On ASA and Lipitor at home. Classifiable as having failed ASA.    Plan: 1. HgbA1c, fasting lipid panel 2. MRI, MRA  of the brain without contrast 3. PT consult, OT consult, Speech consult 4. Echocardiogram 5. Carotid dopplers 6. Add Aggrenox to ASA.  7. Continue Lipitor.  8. Telemetry monitoring 9. Frequent neuro checks. 10. Permissive HTN x 24 hours.  11. TIA alert.   @Electronically  signed: Dr. Kerney Elbe  12/15/2016, 9:22 PM

## 2016-12-15 NOTE — ED Provider Notes (Signed)
[facility] DEPT Provider Note   CSN: [id number] Arrival date & time: 12/15/16  2056     History   Chief Complaint Chief Complaint  Patient presents with  . Code Stroke    HPI Ellen Henry is a 71 y.o. female.  HPI Patient reports that she had worked all day today. She was at home and tried to do chores. She reports that she got very fatigued and felt weak and she thought it was just because she had overexerted herself. She reports about 3 PM she tried to lie on her bed but she couldn't really get herself into it because she was so weak. She reports she got a fell onto the bed and tried to rest a little bit. Reportedly on EMS arrival the patient had left-sided facial droop and arm and leg weakness. Those symptoms have now resolved and the patient denies any focal weakness. She reports she feels better now than she did earlier in the day and still feels generally weak. Patient reports that she has had a pretty booked bad cold throughout the week. Cough has been nonproductive. Past Medical History:  Diagnosis Date  . Arthritis   . Dry skin   . GERD (gastroesophageal reflux disease)    "years ago", diet controlled  . Gout   . H/O pyelonephritis    as a child  . 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 Hypertension   . Neuropathy ([place])   . Pneumonia   . Radiculopathy   . Sleep apnea    does not use cpap    Patient Active Problem List   Diagnosis Date Noted  . Cerebral thrombosis with cerebral infarction 12/15/2016  . Urinary retention 02/25/2015  . Primary osteoarthritis of left knee 02/15/2015  . Morbid obesity ([place]) 02/15/2015  . Essential hypertension, benign 09/07/2014  . Constipation 09/07/2014  . Hyperlipidemia 09/05/2014  . Gout 09/05/2014  . Acute blood loss anemia 09/05/2014  . Neuropathy ([place]) 09/05/2014  . SIRS (systemic inflammatory response syndrome) ([place]) 08/31/2014  . Hypotension 08/31/2014  . UTI (urinary tract infection) 08/31/2014    . Septic shock ([place]) 08/31/2014  . Radiculopathy 08/29/2014    Past Surgical History:  Procedure Laterality Date  . ABDOMINAL EXPOSURE N/A 08/29/2014   Procedure: ABDOMINAL EXPOSURE;  Surgeon: Rosetta Posner, MD;  Location: [place];  Service: Vascular;  Laterality: N/A;  . ABDOMINAL HYSTERECTOMY    . ANTERIOR LUMBAR FUSION N/A 08/29/2014   Procedure: ANTERIOR LUMBAR FUSION 1 LEVEL;  Surgeon: Sinclair Ship, MD;  Location: [place];  Service: Orthopedics;  Laterality: N/A;  Lumbar 4-5 anterior lumbar interbody fusion with allograft and instrumentation.  . APPENDECTOMY    . BACK SURGERY  08/28/2014 and 08/29/2014   lumbar fusion  . CARPAL TUNNEL RELEASE Right    release done twice  . CARPAL TUNNEL RELEASE Right    x 2  . COLONOSCOPY    . JOINT REPLACEMENT    . JOINT REPLACEMENT     right knee  . REPLACEMENT TOTAL KNEE  2010   rigth knee  . TONSILLECTOMY    . TOTAL KNEE ARTHROPLASTY Left 02/15/2015   Procedure: TOTAL KNEE ARTHROPLASTY;  Surgeon: [name], MD;  Location: [place];  Service: Orthopedics;  Laterality: Left;    OB History    Gravida Para Term Preterm AB Living   0 0 0 0 0     SAB TAB Ectopic Multiple Live Births   0 0  0           Home Medications    Prior to Admission medications   Medication Sig Start Date End Date Taking? Authorizing Provider  acetaminophen (TYLENOL) 500 MG tablet Take 1,000 mg by mouth every 6 (six) hours as needed for mild pain.    Historical Provider, MD  aspirin EC 325 MG tablet Take 1 tablet (325 mg total) by mouth 2 (two) times daily after a meal. Take x 1 month post op to decrease risk of blood clots. 02/15/15   Gary Fleet, PA-C  aspirin EC 81 MG EC tablet Take 1 tablet (81 mg total) by mouth daily. Patient not taking: Reported on 11/27/2015 09/04/14   Jonetta Osgood, MD  atorvastatin (LIPITOR) 40 MG tablet Take 1 tablet (40 mg total) by mouth daily at 6 PM. Patient not taking: Reported on 11/27/2015 09/04/14   Jonetta Osgood, MD   azithromycin (ZITHROMAX) 250 MG tablet Take 1 tablet (250 mg total) by mouth daily. Take first 2 tablets together, then 1 every day until finished. Patient not taking: Reported on 11/27/2015 10/26/14   Delice Bison Ward, DO  bethanechol (URECHOLINE) 25 MG tablet Take 1 tablet (25 mg total) by mouth 3 (three) times daily. Patient not taking: Reported on 11/27/2015 02/18/15   Gary Fleet, PA-C  cefpodoxime (VANTIN) 100 MG tablet Take 1 tablet (100 mg total) by mouth every 12 (twelve) hours. STOP DATE 09/06/14 Patient not taking: Reported on 11/27/2015 09/04/14   Jonetta Osgood, MD  colchicine 0.6 MG tablet Take 1 tablet (0.6 mg total) by mouth 2 (two) times daily. 11/27/15   [name], MD  colchicine 0.6 MG tablet Take 1 tablet (0.6 mg total) by mouth daily. 03/23/16   Deno Etienne, DO  furosemide (LASIX) 40 MG tablet Take 40 mg by mouth daily.  01/09/15   Historical Provider, MD  indomethacin (INDOCIN) 25 MG capsule Take 1 po TID with food x 3 d then 1 po BID x 3d then 1 po QD x 3d 11/27/15   [name], MD  methocarbamol (ROBAXIN) 500 MG tablet Take 1 tablet (500 mg total) by mouth every 8 (eight) hours as needed for muscle spasms. Patient not taking: Reported on 03/10/2015 02/15/15   Gary Fleet, PA-C  oxyCODONE (ROXICODONE) 5 MG immediate release tablet Take 0.5 tablets (2.5 mg total) by mouth every 4 (four) hours as needed for severe pain. 03/23/16   Deno Etienne, DO  Oxycodone HCl 20 MG TABS Take 1 tablet (20 mg total) by mouth every 6 (six) hours as needed for severe pain. Patient not taking: Reported on 11/27/2015 03/10/15   Charlann Lange, PA-C  OxyCODONE HCl ER 30 MG T12A Take 30 mg by mouth every 12 (twelve) hours. Patient not taking: Reported on 11/27/2015 09/04/14   Jonetta Osgood, MD  oxyCODONE-acetaminophen (PERCOCET/ROXICET) 5-325 MG per tablet Take 1-2 tablets by mouth every 4 (four) hours as needed for moderate pain. Patient not taking: Reported on 11/27/2015 09/04/14   Jonetta Osgood, MD   polyethylene glycol [facility] Floria Raveling) packet Take 17 g by mouth daily. Patient not taking: Reported on 11/27/2015 09/04/14   Jonetta Osgood, MD  predniSONE (DELTASONE) 20 MG tablet Take 3 tablets (60 mg total) by mouth daily. Patient not taking: Reported on 11/27/2015 10/26/14   Kristen N Ward, DO  senna-docusate (SENOKOT-S) 8.6-50 MG per tablet Take 2 tablets by mouth 2 (two) times daily. Patient not taking: Reported on 11/27/2015 09/04/14   Shanker  [name], MD  TRIBENZOR 20-5-12.5 MG TABS Take 1 tablet by mouth daily. 01/19/15   Historical Provider, MD    Family History Family History  Problem Relation Age of Onset  . Varicose Veins Mother     Social History Social History  Substance Use Topics  . Smoking status: Current Every Day Smoker    Packs/day: 0.25    Types: Cigarettes  . Smokeless tobacco: Never Used  . Alcohol use No     Allergies   Food allergy formula   Review of Systems Review of Systems 10 Systems reviewed and are negative for acute change except as noted in the HPI.   Physical Exam Updated Vital Signs BP 172/100   Pulse 74   Temp 98.6 F (37 C)   Resp 14   Ht 5\' 8"  (1.727 m)   Wt 230 lb (104.3 kg)   SpO2 100%   BMI 34.97 kg/m   Physical Exam  Constitutional: She is oriented to person, place, and time. She appears well-developed and well-nourished. No distress.  HENT:  Head: Normocephalic and atraumatic.  Eyes: Conjunctivae are normal.  Neck: Neck supple.  Cardiovascular: Normal rate and regular rhythm.   No murmur heard. Pulmonary/Chest: Effort normal and breath sounds normal. No respiratory distress.  Abdominal: Soft. There is no tenderness.  Musculoskeletal: She exhibits no edema.  Neurological: She is alert and oriented to person, place, and time. No cranial nerve deficit. She exhibits abnormal muscle tone.  She had increased difficulty elevating the left lower extremity compared to the right.  Skin: Skin is warm and dry.   Psychiatric: She has a normal mood and affect.  Nursing note and vitals reviewed.    ED Treatments / Results  Labs (all labs ordered are listed, but only abnormal results are displayed) Labs Reviewed  APTT - Abnormal; Notable for the following:       Result Value   aPTT 37 (*)    All other components within normal limits  CBC - Abnormal; Notable for the following:    WBC 11.0 (*)    HCT 35.8 (*)    All other components within normal limits  DIFFERENTIAL - Abnormal; Notable for the following:    Lymphs Abs 6.4 (*)    All other components within normal limits  COMPREHENSIVE METABOLIC PANEL - Abnormal; Notable for the following:    Sodium 146 (*)    Potassium 2.9 (*)    Glucose, Bld 105 (*)    Albumin 3.4 (*)    All other components within normal limits  URINALYSIS, ROUTINE W REFLEX MICROSCOPIC - Abnormal; Notable for the following:    Color, Urine STRAW (*)    All other components within normal limits  I-STAT CHEM 8, ED - Abnormal; Notable for the following:    Potassium 2.8 (*)    Glucose, Bld 107 (*)    Calcium, Ion 1.08 (*)    All other components within normal limits  CBG MONITORING, ED - Abnormal; Notable for the following:    Glucose-Capillary 103 (*)    All other components within normal limits  ETHANOL  PROTIME-INR  RAPID URINE DRUG SCREEN, HOSP PERFORMED  INFLUENZA PANEL BY PCR (TYPE A & B, H1N1)  I-STAT TROPOININ, ED    EKG  EKG Interpretation None       Radiology Dg Chest 2 View  Result Date: 12/15/2016 CLINICAL DATA:  Productive cough, shortness of breath for 2-3 months. History of hypertension, pneumonia, smoker. EXAM: CHEST  2 VIEW  COMPARISON:  Chest radiograph October 26, 2014 FINDINGS: Cardiac silhouette is normal. Calcified aortic knob. Unchanged fullness the RIGHT hilum. No pleural effusion or focal consolidation. No pneumothorax. Soft tissue planes and included osseous structures are nonsuspicious, mild degenerative change of the thoracic  spine. Partially imaged lumbar hardware. IMPRESSION: No acute cardiopulmonary process, stable examination. Electronically Signed   By: Elon Alas M.D.   On: 12/15/2016 22:00   Ct Head Wo Contrast  Result Date: 12/15/2016 CLINICAL DATA:  Code stroke.  71 y/o  F; left-sided weakness. EXAM: CT HEAD WITHOUT CONTRAST TECHNIQUE: Contiguous axial images were obtained from the base of the skull through the vertex without intravenous contrast. COMPARISON:  07/21/2005 CT head. FINDINGS: Brain: No evidence for large territory infarct, intracranial hemorrhage, or focal mass effect. There are several small lucencies in the basal ganglia bilaterally and in the left anterior and right posterior corona radiata compatible with age-indeterminate lacunar infarcts. There are mild chronic microvascular ischemic changes and mild volume loss of the brain parenchyma Vascular: No hyperdense vessel. Moderate calcific atherosclerosis of skullbase internal carotid arteries. Skull: Normal. Negative for fracture or focal lesion. Sinuses/Orbits: Mild diffuse paranasal sinus mucosal thickening. Normally pneumatized mastoid air cells. Orbits are unremarkable. Other: None. ASPECTS [facility] Stroke Program Early CT Score) - Ganglionic level infarction (caudate, lentiform nuclei, internal capsule, insula, M1-M3 cortex): 7 - Supraganglionic infarction (M4-M6 cortex): 3 Total score (0-10 with 10 being normal): 10 IMPRESSION: 1. No evidence for large territory infarct, intracranial hemorrhage, or focal mass effect. 2. Several new small lucencies from 2006 in the basal ganglia bilaterally as well as the left anterior and right posterior corona radiata are compatible with age-indeterminate lacunar infarcts. There are mild chronic microvascular ischemic changes and mild volume loss of the brain parenchyma 3. ASPECTS is 10 These results were called by telephone at the time of interpretation on 12/15/2016 at 9:19 pm to Dr. Cheral Marker who verbally  acknowledged these results. Electronically Signed   By: Kristine Garbe M.D.   On: 12/15/2016 21:21    Procedures Procedures (including critical care time)  Medications Ordered in ED Medications  potassium chloride 30 mEq in sodium chloride 0.9 % 265 mL (KCL MULTIRUN) IVPB (not administered)  potassium chloride SA (K-DUR,KLOR-CON) CR tablet 30 mEq (not administered)  iopamidol (ISOVUE-370) 76 % injection (not administered)     Initial Impression / Assessment and Plan / ED Course  I have reviewed the triage vital signs and the nursing notes.  Pertinent labs & imaging results that were available during my care of the patient were reviewed by me and considered in my medical decision making (see chart for details).  Clinical Course   Dr. Rodena Medin evaluated the patient as code stroke. After his initial evaluation: Stroke was discontinued. Subsequently, patient did have more pronounced left-sided weakness again and Dr. Rodena Medin has requested CT perfusion study.  Dr. Fabio Neighbors has evaluated the patient for admission. Will await results of further diagnostic imaging to determine admission status.  Final Clinical Impressions(s) / ED Diagnoses   Final diagnoses:  Generalized weakness  Left-sided weakness  Hypokalemia   She presents with weakness that had a very generalized component to it but also per EMS localizing to the left. Initial CT did not show acute anomaly. Patient was identified to be hypokalemic which has been replaced. He subsequently developed symptoms or more suggestive of localizing again and Dr. Rodena Medin proceeded with acute stroke evaluation. At this time, further diagnostic studies are pending. Final disposition (hospitalist admission versus interventional neuro  radiology management) to be determined when acuity of CVA symptoms further elucidated. Patient maintaining stable airway and normal cognitive function.  New Prescriptions New Prescriptions   No medications on file      Charlesetta Shanks, MD 12/16/16 [phone]

## 2016-12-15 NOTE — ED Notes (Signed)
Code Stroke REACTIVATED @ 2250

## 2016-12-15 NOTE — ED Triage Notes (Signed)
Around 1500 pt began to have left sided weakness when she fell on the bed and was unable to move her arm and leg to get up.  When EMS arrived she had left sided facial droop, arm/leg weakness.  Upon arrival at the hospital all of her deficits had corrected w/ the exception of sensory dep on her left side.  [id number].

## 2016-12-16 ENCOUNTER — Inpatient Hospital Stay (HOSPITAL_COMMUNITY): Payer: Medicare Other

## 2016-12-16 ENCOUNTER — Emergency Department (HOSPITAL_COMMUNITY): Payer: Medicare Other

## 2016-12-16 DIAGNOSIS — R0989 Other specified symptoms and signs involving the circulatory and respiratory systems: Secondary | ICD-10-CM | POA: Diagnosis not present

## 2016-12-16 DIAGNOSIS — K5909 Other constipation: Secondary | ICD-10-CM | POA: Diagnosis not present

## 2016-12-16 DIAGNOSIS — Z7982 Long term (current) use of aspirin: Secondary | ICD-10-CM | POA: Diagnosis not present

## 2016-12-16 DIAGNOSIS — I639 Cerebral infarction, unspecified: Secondary | ICD-10-CM | POA: Diagnosis not present

## 2016-12-16 DIAGNOSIS — R52 Pain, unspecified: Secondary | ICD-10-CM | POA: Diagnosis not present

## 2016-12-16 DIAGNOSIS — Z23 Encounter for immunization: Secondary | ICD-10-CM | POA: Diagnosis not present

## 2016-12-16 DIAGNOSIS — Z90710 Acquired absence of both cervix and uterus: Secondary | ICD-10-CM | POA: Diagnosis not present

## 2016-12-16 DIAGNOSIS — F17210 Nicotine dependence, cigarettes, uncomplicated: Secondary | ICD-10-CM | POA: Diagnosis present

## 2016-12-16 DIAGNOSIS — I6330 Cerebral infarction due to thrombosis of unspecified cerebral artery: Secondary | ICD-10-CM | POA: Diagnosis not present

## 2016-12-16 DIAGNOSIS — E785 Hyperlipidemia, unspecified: Secondary | ICD-10-CM

## 2016-12-16 DIAGNOSIS — R29701 NIHSS score 1: Secondary | ICD-10-CM | POA: Diagnosis present

## 2016-12-16 DIAGNOSIS — M549 Dorsalgia, unspecified: Secondary | ICD-10-CM | POA: Diagnosis not present

## 2016-12-16 DIAGNOSIS — K219 Gastro-esophageal reflux disease without esophagitis: Secondary | ICD-10-CM | POA: Diagnosis present

## 2016-12-16 DIAGNOSIS — I69328 Other speech and language deficits following cerebral infarction: Secondary | ICD-10-CM | POA: Diagnosis not present

## 2016-12-16 DIAGNOSIS — M961 Postlaminectomy syndrome, not elsewhere classified: Secondary | ICD-10-CM | POA: Diagnosis not present

## 2016-12-16 DIAGNOSIS — I638 Other cerebral infarction: Secondary | ICD-10-CM | POA: Diagnosis not present

## 2016-12-16 DIAGNOSIS — I69391 Dysphagia following cerebral infarction: Secondary | ICD-10-CM | POA: Diagnosis not present

## 2016-12-16 DIAGNOSIS — Z79899 Other long term (current) drug therapy: Secondary | ICD-10-CM | POA: Diagnosis not present

## 2016-12-16 DIAGNOSIS — I69354 Hemiplegia and hemiparesis following cerebral infarction affecting left non-dominant side: Secondary | ICD-10-CM | POA: Diagnosis not present

## 2016-12-16 DIAGNOSIS — M5410 Radiculopathy, site unspecified: Secondary | ICD-10-CM | POA: Diagnosis not present

## 2016-12-16 DIAGNOSIS — G8929 Other chronic pain: Secondary | ICD-10-CM | POA: Diagnosis not present

## 2016-12-16 DIAGNOSIS — E876 Hypokalemia: Secondary | ICD-10-CM | POA: Diagnosis not present

## 2016-12-16 DIAGNOSIS — I10 Essential (primary) hypertension: Secondary | ICD-10-CM | POA: Diagnosis not present

## 2016-12-16 DIAGNOSIS — M25561 Pain in right knee: Secondary | ICD-10-CM | POA: Diagnosis not present

## 2016-12-16 DIAGNOSIS — R531 Weakness: Secondary | ICD-10-CM

## 2016-12-16 DIAGNOSIS — R339 Retention of urine, unspecified: Secondary | ICD-10-CM | POA: Diagnosis not present

## 2016-12-16 DIAGNOSIS — M10071 Idiopathic gout, right ankle and foot: Secondary | ICD-10-CM | POA: Diagnosis not present

## 2016-12-16 DIAGNOSIS — G629 Polyneuropathy, unspecified: Secondary | ICD-10-CM | POA: Diagnosis not present

## 2016-12-16 DIAGNOSIS — R1310 Dysphagia, unspecified: Secondary | ICD-10-CM | POA: Diagnosis present

## 2016-12-16 DIAGNOSIS — Z6834 Body mass index (BMI) 34.0-34.9, adult: Secondary | ICD-10-CM | POA: Diagnosis not present

## 2016-12-16 DIAGNOSIS — Z981 Arthrodesis status: Secondary | ICD-10-CM | POA: Diagnosis not present

## 2016-12-16 DIAGNOSIS — G894 Chronic pain syndrome: Secondary | ICD-10-CM | POA: Diagnosis not present

## 2016-12-16 DIAGNOSIS — Z96653 Presence of artificial knee joint, bilateral: Secondary | ICD-10-CM | POA: Diagnosis not present

## 2016-12-16 DIAGNOSIS — G4733 Obstructive sleep apnea (adult) (pediatric): Secondary | ICD-10-CM | POA: Diagnosis not present

## 2016-12-16 DIAGNOSIS — G8194 Hemiplegia, unspecified affecting left nondominant side: Secondary | ICD-10-CM | POA: Diagnosis not present

## 2016-12-16 DIAGNOSIS — M545 Low back pain: Secondary | ICD-10-CM | POA: Diagnosis not present

## 2016-12-16 DIAGNOSIS — I69392 Facial weakness following cerebral infarction: Secondary | ICD-10-CM | POA: Diagnosis not present

## 2016-12-16 DIAGNOSIS — M5416 Radiculopathy, lumbar region: Secondary | ICD-10-CM | POA: Diagnosis present

## 2016-12-16 DIAGNOSIS — K5901 Slow transit constipation: Secondary | ICD-10-CM | POA: Diagnosis not present

## 2016-12-16 LAB — COMPREHENSIVE METABOLIC PANEL
ALT: 15 U/L (ref 14–54)
AST: 20 U/L (ref 15–41)
Albumin: 3.4 g/dL — ABNORMAL LOW (ref 3.5–5.0)
Alkaline Phosphatase: 115 U/L (ref 38–126)
Anion gap: 7 (ref 5–15)
BUN: 5 mg/dL — ABNORMAL LOW (ref 6–20)
CO2: 29 mmol/L (ref 22–32)
Calcium: 9.2 mg/dL (ref 8.9–10.3)
Chloride: 109 mmol/L (ref 101–111)
Creatinine, Ser: 0.62 mg/dL (ref 0.44–1.00)
GFR calc Af Amer: >60 mL/min (ref >60)
GFR calc non Af Amer: >60 mL/min (ref >60)
Glucose, Bld: 101 mg/dL — ABNORMAL HIGH (ref 65–99)
Potassium: 2.9 mmol/L — ABNORMAL LOW (ref 3.5–5.1)
Sodium: 145 mmol/L (ref 135–145)
Total Bilirubin: 0.7 mg/dL (ref 0.3–1.2)
Total Protein: 7.3 g/dL (ref 6.5–8.1)

## 2016-12-16 LAB — BASIC METABOLIC PANEL
Anion gap: 8 (ref 5–15)
BUN: 6 mg/dL (ref 6–20)
CO2: 28 mmol/L (ref 22–32)
Calcium: 9.4 mg/dL (ref 8.9–10.3)
Chloride: 105 mmol/L (ref 101–111)
Creatinine, Ser: 0.62 mg/dL (ref 0.44–1.00)
GFR calc Af Amer: >60 mL/min (ref >60)
GFR calc non Af Amer: >60 mL/min (ref >60)
Glucose, Bld: 98 mg/dL (ref 65–99)
Potassium: 3.5 mmol/L (ref 3.5–5.1)
Sodium: 141 mmol/L (ref 135–145)

## 2016-12-16 LAB — LIPID PANEL
Cholesterol: 224 mg/dL — ABNORMAL HIGH (ref 0–200)
HDL: 65 mg/dL (ref >40)
LDL Cholesterol: 147 mg/dL — ABNORMAL HIGH (ref 0–99)
Total CHOL/HDL Ratio: 3.4 RATIO
Triglycerides: 61 mg/dL (ref <150)
VLDL: 12 mg/dL (ref 0–40)

## 2016-12-16 LAB — PATHOLOGIST SMEAR REVIEW

## 2016-12-16 LAB — INFLUENZA PANEL BY PCR (TYPE A & B)
Influenza A By PCR: NEGATIVE
Influenza B By PCR: NEGATIVE

## 2016-12-16 LAB — MAGNESIUM: Magnesium: 1.8 mg/dL (ref 1.7–2.4)

## 2016-12-16 MED ORDER — ASPIRIN EC 325 MG PO TBEC: 325.0000 mg | DELAYED_RELEASE_TABLET | Freq: Two times a day (BID) | ORAL | Status: DC

## 2016-12-16 MED ORDER — POTASSIUM CHLORIDE IN NACL 20-0.9 MEQ/L-% IV SOLN
INTRAVENOUS | Status: DC
  Administered 2016-12-16 – 2016-12-17 (×2): via INTRAVENOUS
  Filled 2016-12-16 (×2): qty 1000

## 2016-12-16 MED ORDER — POTASSIUM CHLORIDE CRYS ER 20 MEQ PO TBCR: 30.0000 meq | EXTENDED_RELEASE_TABLET | Freq: Once | ORAL | Status: DC

## 2016-12-16 MED ORDER — LORAZEPAM 2 MG/ML IJ SOLN
INTRAMUSCULAR | Status: AC
  Filled 2016-12-16: qty 1

## 2016-12-16 MED ORDER — OXYCODONE HCL 5 MG PO TABS
5.0000 mg | ORAL_TABLET | Freq: Three times a day (TID) | ORAL | Status: DC | PRN
  Administered 2016-12-16 – 2016-12-17 (×4): 5 mg via ORAL
  Filled 2016-12-16 (×4): qty 1

## 2016-12-16 MED ORDER — HYDRALAZINE HCL 20 MG/ML IJ SOLN: 5.0000 mg | INTRAMUSCULAR | Status: DC | PRN

## 2016-12-16 MED ORDER — ACETAMINOPHEN 160 MG/5ML PO SOLN: 650.0000 mg | ORAL | Status: DC | PRN

## 2016-12-16 MED ORDER — ENOXAPARIN SODIUM 40 MG/0.4ML ~~LOC~~ SOLN
40.0000 mg | SUBCUTANEOUS | Status: DC
  Administered 2016-12-16 – 2016-12-17 (×2): 40 mg via SUBCUTANEOUS
  Filled 2016-12-16 (×2): qty 0.4

## 2016-12-16 MED ORDER — INFLUENZA VAC SPLIT QUAD 0.5 ML IM SUSY
0.5000 mL | PREFILLED_SYRINGE | INTRAMUSCULAR | Status: AC
  Administered 2016-12-17: 0.5 mL via INTRAMUSCULAR
  Filled 2016-12-16: qty 0.5

## 2016-12-16 MED ORDER — ORAL CARE MOUTH RINSE
15.0000 mL | Freq: Two times a day (BID) | OROMUCOSAL | Status: DC
  Administered 2016-12-16: 15 mL via OROMUCOSAL

## 2016-12-16 MED ORDER — ASPIRIN EC 325 MG PO TBEC
325.0000 mg | DELAYED_RELEASE_TABLET | Freq: Every day | ORAL | Status: DC
  Administered 2016-12-16 – 2016-12-17 (×2): 325 mg via ORAL
  Filled 2016-12-16 (×3): qty 1

## 2016-12-16 MED ORDER — ATORVASTATIN CALCIUM 40 MG PO TABS
40.0000 mg | ORAL_TABLET | Freq: Every day | ORAL | Status: DC
  Administered 2016-12-16: 40 mg via ORAL
  Filled 2016-12-16: qty 1

## 2016-12-16 MED ORDER — PROMETHAZINE HCL 25 MG/ML IJ SOLN
12.5000 mg | Freq: Once | INTRAMUSCULAR | Status: AC
Start: 2016-12-16 — End: 2016-12-16
  Administered 2016-12-16: 12.5 mg via INTRAVENOUS
  Filled 2016-12-16: qty 1

## 2016-12-16 MED ORDER — METHOCARBAMOL 500 MG PO TABS
500.0000 mg | ORAL_TABLET | Freq: Three times a day (TID) | ORAL | Status: DC | PRN
  Administered 2016-12-16 – 2016-12-17 (×3): 500 mg via ORAL
  Filled 2016-12-16 (×3): qty 1

## 2016-12-16 MED ORDER — LORAZEPAM 2 MG/ML IJ SOLN
1.0000 mg | Freq: Once | INTRAMUSCULAR | Status: AC
  Administered 2016-12-16: 1 mg via INTRAVENOUS

## 2016-12-16 MED ORDER — SODIUM CHLORIDE 0.9 % IV SOLN
30.0000 meq | Freq: Once | INTRAVENOUS | Status: AC
  Administered 2016-12-16: 30 meq via INTRAVENOUS
  Filled 2016-12-16: qty 15

## 2016-12-16 MED ORDER — NICOTINE 7 MG/24HR TD PT24
7.0000 mg | MEDICATED_PATCH | Freq: Every day | TRANSDERMAL | Status: DC
  Administered 2016-12-16 – 2016-12-17 (×2): 7 mg via TRANSDERMAL
  Filled 2016-12-16 (×2): qty 1

## 2016-12-16 MED ORDER — ACETAMINOPHEN 650 MG RE SUPP
650.0000 mg | RECTAL | Status: DC | PRN
Start: 2016-12-16 — End: 2016-12-17
  Administered 2016-12-16 (×2): 650 mg via RECTAL
  Filled 2016-12-16 (×2): qty 1

## 2016-12-16 MED ORDER — STROKE: EARLY STAGES OF RECOVERY BOOK
Freq: Once | Status: AC
  Administered 2016-12-16: 05:00:00
  Filled 2016-12-16: qty 1

## 2016-12-16 MED ORDER — CHLORHEXIDINE GLUCONATE 0.12 % MT SOLN
15.0000 mL | Freq: Two times a day (BID) | OROMUCOSAL | Status: DC
  Administered 2016-12-16 – 2016-12-17 (×2): 15 mL via OROMUCOSAL
  Filled 2016-12-16: qty 15

## 2016-12-16 MED ORDER — ACETAMINOPHEN 325 MG PO TABS: 650.0000 mg | ORAL_TABLET | ORAL | Status: DC | PRN

## 2016-12-16 MED ORDER — SODIUM CHLORIDE 0.9 % IV SOLN
INTRAVENOUS | Status: DC
  Administered 2016-12-16: 04:00:00 via INTRAVENOUS

## 2016-12-16 MED ORDER — MAGNESIUM SULFATE 2 GM/50ML IV SOLN
2.0000 g | Freq: Once | INTRAVENOUS | Status: AC
  Administered 2016-12-16: 2 g via INTRAVENOUS
  Filled 2016-12-16: qty 50

## 2016-12-16 MED ORDER — SENNOSIDES-DOCUSATE SODIUM 8.6-50 MG PO TABS
1.0000 | ORAL_TABLET | Freq: Every evening | ORAL | Status: DC | PRN
  Administered 2016-12-17: 1 via ORAL
  Filled 2016-12-16 (×2): qty 1

## 2016-12-16 NOTE — H&P (Addendum)
History and Physical    Ellen Henry [id number] DOB: 07/30/1946 DOA: 12/15/2016   PCP: Elyn Peers, MD Chief Complaint:  Chief Complaint  Patient presents with  . Code Stroke    HPI: Ellen Henry is a 71 y.o. female with medical history significant of HTN, HLD.  Patient presents to the ED with c/o L sided weakness.  Symptoms onset around 3pm or so, symptoms appear to be waxing and waning including on neurologist exam in the ED.  Nothing makes symptoms better or worse.  Took ASA 325 today for symptoms.  She does admit that she hasnt been very adhearant to medications: she hasnt taken BP meds in about a year, has been off of statins for about a year, takes ASA (which she is supposed to be on daily) only "sporadically".  Last time before today was "about a week ago".  ED Course: Code stroke re-activated and patient underwent CT angio which showed no target blockage for intervention.  Review of Systems: As per HPI otherwise 10 point review of systems negative.    Past Medical History:  Diagnosis Date  . Arthritis   . Dry skin   . GERD (gastroesophageal reflux disease)    "years ago", diet controlled  . Gout   . H/O pyelonephritis    as a child  . 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 Hypertension   . Neuropathy ([place])   . Pneumonia   . Radiculopathy   . Sleep apnea    does not use cpap    Past Surgical History:  Procedure Laterality Date  . ABDOMINAL EXPOSURE N/A 08/29/2014   Procedure: ABDOMINAL EXPOSURE;  Surgeon: Rosetta Posner, MD;  Location: [facility];  Service: Vascular;  Laterality: N/A;  . ABDOMINAL HYSTERECTOMY    . ANTERIOR LUMBAR FUSION N/A 08/29/2014   Procedure: ANTERIOR LUMBAR FUSION 1 LEVEL;  Surgeon: Sinclair Ship, MD;  Location: [facility];  Service: Orthopedics;  Laterality: N/A;  Lumbar 4-5 anterior lumbar interbody fusion with allograft and instrumentation.  . APPENDECTOMY    . BACK SURGERY  08/28/2014 and 08/29/2014   lumbar fusion   . CARPAL TUNNEL RELEASE Right    release done twice  . CARPAL TUNNEL RELEASE Right    x 2  . COLONOSCOPY    . JOINT REPLACEMENT    . JOINT REPLACEMENT     right knee  . REPLACEMENT TOTAL KNEE  2010   rigth knee  . TONSILLECTOMY    . TOTAL KNEE ARTHROPLASTY Left 02/15/2015   Procedure: TOTAL KNEE ARTHROPLASTY;  Surgeon: [name], MD;  Location: [place];  Service: Orthopedics;  Laterality: Left;     reports that she has been smoking Cigarettes.  She has been smoking about 0.25 packs per day. She has never used smokeless tobacco. She reports that she does not drink alcohol or use drugs.  Allergies  Allergen Reactions  . Food Allergy Formula Other (See Comments)    Shellfish-banana-beef-flares up her gout    Family History  Problem Relation Age of Onset  . Varicose Veins Mother       Prior to Admission medications   Medication Sig Start Date End Date Taking? Authorizing Provider  acetaminophen (TYLENOL) 500 MG tablet Take 1,000 mg by mouth every 6 (six) hours as needed for mild pain.   Yes Historical Provider, MD  aspirin EC 81 MG tablet Take 81 mg by mouth every 6 (six) hours as needed for mild pain.  Yes Historical Provider, MD  colchicine 0.6 MG tablet Take 1 tablet (0.6 mg total) by mouth daily. Patient taking differently: Take 0.6 mg by mouth daily as needed (pain).  03/23/16  Yes Deno Etienne, DO  ibuprofen (ADVIL,MOTRIN) 200 MG tablet Take 400 mg by mouth every 6 (six) hours as needed for moderate pain.   Yes Historical Provider, MD  indomethacin (INDOCIN) 25 MG capsule Take 1 po TID with food x 3 d then 1 po BID x 3d then 1 po QD x 3d Patient taking differently: Take 25 mg by mouth daily as needed for mild pain.  11/27/15  Yes [name], MD    Physical Exam: Vitals:   12/15/16 2127 12/15/16 2128 12/15/16 2130 12/15/16 2243  BP: (!) 172/114  172/100   Pulse: 79  74   Resp: 18  14   Temp: 98.2 F (36.8 C)   98.6 F (37 C)  TempSrc: Oral     SpO2: 100%  100%     Weight:  104.3 kg (230 lb)    Height:  5\' 8"  (1.727 m)        Constitutional: NAD, calm, comfortable Eyes: PERRL, lids and conjunctivae normal ENMT: Mucous membranes are moist. Posterior pharynx clear of any exudate or lesions.Normal dentition.  Neck: normal, supple, no masses, no thyromegaly Respiratory: clear to auscultation bilaterally, no wheezing, no crackles. Normal respiratory effort. No accessory muscle use.  Cardiovascular: Regular rate and rhythm, no murmurs / rubs / gallops. No extremity edema. 2+ pedal pulses. No carotid bruits.  Abdomen: no tenderness, no masses palpated. No hepatosplenomegaly. Bowel sounds positive.  Musculoskeletal: no clubbing / cyanosis. No joint deformity upper and lower extremities. Good ROM, no contractures. Normal muscle tone.  Skin: no rashes, lesions, ulcers. No induration Neurologic: L facial droop, LUE and LLE 2/5 strength. Psychiatric: Normal judgment and insight. Alert and oriented x 3. Normal mood.    Labs on Admission: I have personally reviewed following labs and imaging studies  CBC:  Recent Labs Lab 12/15/16 2103 12/15/16 2111  WBC 11.0*  --   NEUTROABS 3.7  --   HGB 12.1 12.2  HCT 35.8* 36.0  MCV 82.5  --   PLT 227  --    Basic Metabolic Panel:  Recent Labs Lab 12/15/16 2103 12/15/16 2111  NA 146* 145  K 2.9* 2.8*  CL 107 106  CO2 27  --   GLUCOSE 105* 107*  BUN 9 10  CREATININE 0.79 0.80  CALCIUM 9.1  --    GFR: Estimated Creatinine Clearance: 81.6 mL/min (by C-G formula based on SCr of 0.8 mg/dL). Liver Function Tests:  Recent Labs Lab 12/15/16 2103  AST 21  ALT 15  ALKPHOS 110  BILITOT 0.3  PROT 6.8  ALBUMIN 3.4*   No results for input(s): LIPASE, AMYLASE in the last 168 hours. No results for input(s): AMMONIA in the last 168 hours. Coagulation Profile:  Recent Labs Lab 12/15/16 2103  INR 1.01   Cardiac Enzymes: No results for input(s): CKTOTAL, CKMB, CKMBINDEX, TROPONINI in the last 168  hours. BNP (last 3 results) No results for input(s): PROBNP in the last 8760 hours. HbA1C: No results for input(s): HGBA1C in the last 72 hours. CBG:  Recent Labs Lab 12/15/16 2101  GLUCAP 103*   Lipid Profile: No results for input(s): CHOL, HDL, LDLCALC, TRIG, CHOLHDL, LDLDIRECT in the last 72 hours. Thyroid Function Tests: No results for input(s): TSH, T4TOTAL, FREET4, T3FREE, THYROIDAB in the last 72 hours. Anemia Panel:  No results for input(s): VITAMINB12, FOLATE, FERRITIN, TIBC, IRON, RETICCTPCT in the last 72 hours. Urine analysis:    Component Value Date/Time   COLORURINE STRAW (A) 12/15/2016 2143   APPEARANCEUR CLEAR 12/15/2016 2143   LABSPEC 1.010 12/15/2016 2143   PHURINE 7.0 12/15/2016 2143   GLUCOSEU NEGATIVE 12/15/2016 2143   HGBUR NEGATIVE 12/15/2016 2143   BILIRUBINUR NEGATIVE 12/15/2016 2143   KETONESUR NEGATIVE 12/15/2016 2143   PROTEINUR NEGATIVE 12/15/2016 2143   UROBILINOGEN 0.2 02/16/2015 1649   NITRITE NEGATIVE 12/15/2016 2143   LEUKOCYTESUR NEGATIVE 12/15/2016 2143   Sepsis Labs: @LABRCNTIP (procalcitonin:4,lacticidven:4) )No results found for this or any previous visit (from the past 240 hour(s)).   Radiological Exams on Admission: Ct Angio Head W Or Wo Contrast  Result Date: 12/16/2016 CLINICAL DATA:  Weakness EXAM: CT PERFUSION BRAIN CT ANGIOGRAPHY HEAD CT ANGIOGRAPHY NECK TECHNIQUE: Multiphase CT imaging of the brain was performed following IV bolus contrast injection. Subsequent parametric perfusion maps were calculated using RAPID software. Multidetector CT imaging of the head and neck was performed using the standard protocol during bolus administration of intravenous contrast. Multiplanar CT image reconstructions and MIPs were obtained to evaluate the vascular anatomy. Carotid stenosis measurements (when applicable) are obtained utilizing NASCET criteria, using the distal internal carotid diameter as the denominator. CONTRAST:  90 mL Isovue 370  IV COMPARISON:  None. FINDINGS: CT HEAD FINDINGS Brain: No mass lesion, intraparenchymal hemorrhage or extra-axial collection. No evidence of acute cortical infarct. AREAS of hypoattenuation in the right basal ganglia and left frontal white matter are suggestive of chronic infarcts. Vascular: No hyperdense vessel or unexpected calcification. Skull: Normal visualized skull base, calvarium and extracranial soft tissues. Sinuses/Orbits: Ethmoid and maxillary mild mucosal thickening with atelectatic right maxillary sinus. Normal orbits. CTA NECK FINDINGS Aortic arch: There is no aneurysm or dissection of the visualized ascending aorta or aortic arch. There is a normal variant aortic arch branching pattern with the brachiocephalic and left common carotid arteries sharing a common origin. The proximal subclavian arteries are patent. There is mild calcification of the aortic arch. Right carotid system: The right common carotid origin is widely patent. There is no common carotid or internal carotid artery dissection or aneurysm. There is mild atherosclerotic calcification at the carotid bifurcation without hemodynamically significant stenosis. Left carotid system: The left common carotid origin is widely patent. There is no common carotid or internal carotid artery dissection or aneurysm. There is mild atherosclerotic calcification at the carotid bifurcation without hemodynamically significant stenosis. Vertebral arteries: The vertebral system is mildly right-dominant. Both vertebral artery origins are normal. Both vertebral arteries are normal to their confluence with the basilar artery. Skeleton: There is no bony spinal canal stenosis. No lytic or blastic lesions. Other neck: The nasopharynx is clear. The oropharynx and hypopharynx are normal. The epiglottis is normal. The supraglottic larynx, glottis and subglottic larynx are normal. No retropharyngeal collection. The parapharyngeal spaces are preserved. The parotid and  submandibular glands are normal. No sialolithiasis or salivary ductal dilatation. The thyroid gland is normal. There is no cervical lymphadenopathy. Upper chest: No pneumothorax or pleural effusion. No nodules or masses. Review of the MIP images confirms the above findings CTA HEAD FINDINGS Anterior circulation: --Intracranial internal carotid arteries: There is atherosclerotic calcification of the bilateral cavernous and clinoid segments without advanced stenosis. 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 Otherwise normal. --Middle cerebral arteries: Normal. --Posterior communicating arteries: Small P-comm is present on the right. Posterior circulation: --Posterior cerebral arteries: Normal. --  Superior cerebellar arteries: Normal. --Basilar artery: Normal. --Anterior inferior cerebellar arteries: Normal. --Posterior inferior cerebellar arteries: Normal. Venous sinuses: As permitted by contrast timing, patent. Anatomic variants: Absent right anterior cerebral artery A1 segment, a normal variant. Review of the MIP images confirms the above findings CT Brain Perfusion Findings: CBF (<30%) Volume: 36mL Perfusion (Tmax>6.0s) volume: 60mL Mismatch Volume: 45mL Infarction Location:None IMPRESSION: 1. No perfusion abnormality or other evidence of acute infarct. 2. No intracranial arterial occlusion or high-grade stenosis. 3. Mild carotid bifurcation atherosclerosis without hemodynamically significant stenosis. 4. Mild aortic atherosclerosis. Electronically Signed   By: Ulyses Jarred M.D.   On: 12/16/2016 01:02   Dg Chest 2 View  Result Date: 12/15/2016 CLINICAL DATA:  Productive cough, shortness of breath for 2-3 months. History of hypertension, pneumonia, smoker. EXAM: CHEST  2 VIEW COMPARISON:  Chest radiograph October 26, 2014 FINDINGS: Cardiac silhouette is normal. Calcified aortic knob. Unchanged fullness the RIGHT hilum. No pleural effusion or focal consolidation. No  pneumothorax. Soft tissue planes and included osseous structures are nonsuspicious, mild degenerative change of the thoracic spine. Partially imaged lumbar hardware. IMPRESSION: No acute cardiopulmonary process, stable examination. Electronically Signed   By: Elon Alas M.D.   On: 12/15/2016 22:00   Ct Head Wo Contrast  Result Date: 12/15/2016 CLINICAL DATA:  Code stroke.  71 y/o  F; left-sided weakness. EXAM: CT HEAD WITHOUT CONTRAST TECHNIQUE: Contiguous axial images were obtained from the base of the skull through the vertex without intravenous contrast. COMPARISON:  07/21/2005 CT head. FINDINGS: Brain: No evidence for large territory infarct, intracranial hemorrhage, or focal mass effect. There are several small lucencies in the basal ganglia bilaterally and in the left anterior and right posterior corona radiata compatible with age-indeterminate lacunar infarcts. There are mild chronic microvascular ischemic changes and mild volume loss of the brain parenchyma Vascular: No hyperdense vessel. Moderate calcific atherosclerosis of skullbase internal carotid arteries. Skull: Normal. Negative for fracture or focal lesion. Sinuses/Orbits: Mild diffuse paranasal sinus mucosal thickening. Normally pneumatized mastoid air cells. Orbits are unremarkable. Other: None. ASPECTS [facility] Stroke Program Early CT Score) - Ganglionic level infarction (caudate, lentiform nuclei, internal capsule, insula, M1-M3 cortex): 7 - Supraganglionic infarction (M4-M6 cortex): 3 Total score (0-10 with 10 being normal): 10 IMPRESSION: 1. No evidence for large territory infarct, intracranial hemorrhage, or focal mass effect. 2. Several new small lucencies from 2006 in the basal ganglia bilaterally as well as the left anterior and right posterior corona radiata are compatible with age-indeterminate lacunar infarcts. There are mild chronic microvascular ischemic changes and mild volume loss of the brain parenchyma 3. ASPECTS is 10  These results were called by telephone at the time of interpretation on 12/15/2016 at 9:19 pm to Dr. Cheral Marker who verbally acknowledged these results. Electronically Signed   By: Kristine Garbe M.D.   On: 12/15/2016 21:21   Ct Angio Neck W And/or Wo Contrast  Result Date: 12/16/2016 CLINICAL DATA:  Weakness EXAM: CT PERFUSION BRAIN CT ANGIOGRAPHY HEAD CT ANGIOGRAPHY NECK TECHNIQUE: Multiphase CT imaging of the brain was performed following IV bolus contrast injection. Subsequent parametric perfusion maps were calculated using RAPID software. Multidetector CT imaging of the head and neck was performed using the standard protocol during bolus administration of intravenous contrast. Multiplanar CT image reconstructions and MIPs were obtained to evaluate the vascular anatomy. Carotid stenosis measurements (when applicable) are obtained utilizing NASCET criteria, using the distal internal carotid diameter as the denominator. CONTRAST:  90 mL Isovue 370 IV COMPARISON:  None. FINDINGS: CT  HEAD FINDINGS Brain: No mass lesion, intraparenchymal hemorrhage or extra-axial collection. No evidence of acute cortical infarct. AREAS of hypoattenuation in the right basal ganglia and left frontal white matter are suggestive of chronic infarcts. Vascular: No hyperdense vessel or unexpected calcification. Skull: Normal visualized skull base, calvarium and extracranial soft tissues. Sinuses/Orbits: Ethmoid and maxillary mild mucosal thickening with atelectatic right maxillary sinus. Normal orbits. CTA NECK FINDINGS Aortic arch: There is no aneurysm or dissection of the visualized ascending aorta or aortic arch. There is a normal variant aortic arch branching pattern with the brachiocephalic and left common carotid arteries sharing a common origin. The proximal subclavian arteries are patent. There is mild calcification of the aortic arch. Right carotid system: The right common carotid origin is widely patent. There is no  common carotid or internal carotid artery dissection or aneurysm. There is mild atherosclerotic calcification at the carotid bifurcation without hemodynamically significant stenosis. Left carotid system: The left common carotid origin is widely patent. There is no common carotid or internal carotid artery dissection or aneurysm. There is mild atherosclerotic calcification at the carotid bifurcation without hemodynamically significant stenosis. Vertebral arteries: The vertebral system is mildly right-dominant. Both vertebral artery origins are normal. Both vertebral arteries are normal to their confluence with the basilar artery. Skeleton: There is no bony spinal canal stenosis. No lytic or blastic lesions. Other neck: The nasopharynx is clear. The oropharynx and hypopharynx are normal. The epiglottis is normal. The supraglottic larynx, glottis and subglottic larynx are normal. No retropharyngeal collection. The parapharyngeal spaces are preserved. The parotid and submandibular glands are normal. No sialolithiasis or salivary ductal dilatation. The thyroid gland is normal. There is no cervical lymphadenopathy. Upper chest: No pneumothorax or pleural effusion. No nodules or masses. Review of the MIP images confirms the above findings CTA HEAD FINDINGS Anterior circulation: --Intracranial internal carotid arteries: There is atherosclerotic calcification of the bilateral cavernous and clinoid segments without advanced stenosis. 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 Otherwise normal. --Middle cerebral arteries: Normal. --Posterior communicating arteries: Small P-comm is present on the right. Posterior circulation: --Posterior cerebral arteries: Normal. --Superior cerebellar arteries: Normal. --Basilar artery: Normal. --Anterior inferior cerebellar arteries: Normal. --Posterior inferior cerebellar arteries: Normal. Venous sinuses: As permitted by contrast timing, patent.  Anatomic variants: Absent right anterior cerebral artery A1 segment, a normal variant. Review of the MIP images confirms the above findings CT Brain Perfusion Findings: CBF (<30%) Volume: 55mL Perfusion (Tmax>6.0s) volume: 2mL Mismatch Volume: 55mL Infarction Location:None IMPRESSION: 1. No perfusion abnormality or other evidence of acute infarct. 2. No intracranial arterial occlusion or high-grade stenosis. 3. Mild carotid bifurcation atherosclerosis without hemodynamically significant stenosis. 4. Mild aortic atherosclerosis. Electronically Signed   By: Ulyses Jarred M.D.   On: 12/16/2016 01:02   Ct Cerebral Perfusion W Contrast  Result Date: 12/16/2016 CLINICAL DATA:  Weakness EXAM: CT PERFUSION BRAIN CT ANGIOGRAPHY HEAD CT ANGIOGRAPHY NECK TECHNIQUE: Multiphase CT imaging of the brain was performed following IV bolus contrast injection. Subsequent parametric perfusion maps were calculated using RAPID software. Multidetector CT imaging of the head and neck was performed using the standard protocol during bolus administration of intravenous contrast. Multiplanar CT image reconstructions and MIPs were obtained to evaluate the vascular anatomy. Carotid stenosis measurements (when applicable) are obtained utilizing NASCET criteria, using the distal internal carotid diameter as the denominator. CONTRAST:  90 mL Isovue 370 IV COMPARISON:  None. FINDINGS: CT HEAD FINDINGS Brain: No mass lesion, intraparenchymal hemorrhage or extra-axial collection. No  evidence of acute cortical infarct. AREAS of hypoattenuation in the right basal ganglia and left frontal white matter are suggestive of chronic infarcts. Vascular: No hyperdense vessel or unexpected calcification. Skull: Normal visualized skull base, calvarium and extracranial soft tissues. Sinuses/Orbits: Ethmoid and maxillary mild mucosal thickening with atelectatic right maxillary sinus. Normal orbits. CTA NECK FINDINGS Aortic arch: There is no aneurysm or dissection  of the visualized ascending aorta or aortic arch. There is a normal variant aortic arch branching pattern with the brachiocephalic and left common carotid arteries sharing a common origin. The proximal subclavian arteries are patent. There is mild calcification of the aortic arch. Right carotid system: The right common carotid origin is widely patent. There is no common carotid or internal carotid artery dissection or aneurysm. There is mild atherosclerotic calcification at the carotid bifurcation without hemodynamically significant stenosis. Left carotid system: The left common carotid origin is widely patent. There is no common carotid or internal carotid artery dissection or aneurysm. There is mild atherosclerotic calcification at the carotid bifurcation without hemodynamically significant stenosis. Vertebral arteries: The vertebral system is mildly right-dominant. Both vertebral artery origins are normal. Both vertebral arteries are normal to their confluence with the basilar artery. Skeleton: There is no bony spinal canal stenosis. No lytic or blastic lesions. Other neck: The nasopharynx is clear. The oropharynx and hypopharynx are normal. The epiglottis is normal. The supraglottic larynx, glottis and subglottic larynx are normal. No retropharyngeal collection. The parapharyngeal spaces are preserved. The parotid and submandibular glands are normal. No sialolithiasis or salivary ductal dilatation. The thyroid gland is normal. There is no cervical lymphadenopathy. Upper chest: No pneumothorax or pleural effusion. No nodules or masses. Review of the MIP images confirms the above findings CTA HEAD FINDINGS Anterior circulation: --Intracranial internal carotid arteries: There is atherosclerotic calcification of the bilateral cavernous and clinoid segments without advanced stenosis. 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 Otherwise normal. --Middle cerebral arteries:  Normal. --Posterior communicating arteries: Small P-comm is present on the right. Posterior circulation: --Posterior cerebral arteries: Normal. --Superior cerebellar arteries: Normal. --Basilar artery: Normal. --Anterior inferior cerebellar arteries: Normal. --Posterior inferior cerebellar arteries: Normal. Venous sinuses: As permitted by contrast timing, patent. Anatomic variants: Absent right anterior cerebral artery A1 segment, a normal variant. Review of the MIP images confirms the above findings CT Brain Perfusion Findings: CBF (<30%) Volume: 69mL Perfusion (Tmax>6.0s) volume: 52mL Mismatch Volume: 84mL Infarction Location:None IMPRESSION: 1. No perfusion abnormality or other evidence of acute infarct. 2. No intracranial arterial occlusion or high-grade stenosis. 3. Mild carotid bifurcation atherosclerosis without hemodynamically significant stenosis. 4. Mild aortic atherosclerosis. Electronically Signed   By: Ulyses Jarred M.D.   On: 12/16/2016 01:02    EKG: Independently reviewed.  Assessment/Plan Principal Problem:   Acute ischemic stroke [facility]) Active Problems:   Hyperlipidemia   Essential hypertension, benign    1. Acute ischemic stroke - 1. Stroke pathway 2. MRI brain 3. CTA head and neck showed no significant large vessel stenosis or carotid stenosis 1. Will skip ordering Carotid US for now as CTA neck was just done 4. Tele monitor 5. Will order ASA 325 daily, will defer decision on aggrenox to stroke team tomorrow; however, Im not really convinced that this represents a "failed" ASA situation, as patient readily admit that she wasn't very compliant with this medication taking it "sporadically" at best (which sounds like maybe once a week, if that). 2. HTN - 1. allow permissive HTN in setting of waxing and  waning symptoms with acute stroke 2. Of note she hasnt been on BP meds anyhow for about a year now 3. After acute episode, she will need to go back on BP meds for chronic  management 1. States reason she quit taking was due to the cost of tribenzor (likely need to end up on separate pills for amlodipine, HCTZ, and ARB as generic individuals). 3. HLD - 1. Lipid profile pending 2. But will go ahead and put patient back on lipitor given history and high probability that this will be needed 3. Apparently was on "high dose crestor" in past   DVT prophylaxis: Lovenox Code Status: Full Family Communication: Family at bedside Consults called: Neuro Admission status: Admit to inpatient   Etta Quill DO [facility] Pager [phone] from 7PM-7AM  If 7AM-7PM, please contact the day physician for the patient [email or web address] Password [email or web address]  12/16/2016, 1:59 AM

## 2016-12-16 NOTE — Progress Notes (Signed)
Thank you for consult on Ellen Henry. Note that stroke work up underway and therapy evaluations today limited due to pain. Currently involved in nursing care. Will follow up in am to complete CIR evaluation.

## 2016-12-16 NOTE — Evaluation (Signed)
Physical Therapy Evaluation Patient Details Name: Ellen Henry MRN: [id number] DOB: 07-19-1946 Today's Date: 12/16/2016   History of Present Illness  Pt is a 71 y/o female with a PMH significant for chronic back pain, HTN. Pt presented for acute onset of stuttering TIA symptoms. She was at home in usual state of health when she suddenly became diffusely weak. She denies that she had lateralized weakness. The diffuse weakness made it difficult for her to get back into bed to rest. Her symptoms resolved but then recurred; she called EMS who noted left facial droop and left facial weakness, which had resolved per ED staff at time of presentation to [facility]. MRI positive for acute CVA.  Clinical Impression  Pt admitted with above diagnosis. Pt currently with functional limitations due to the deficits listed below (see PT Problem List). At the time of PT eval pt was very restless and reporting significant pain in back. +2 assist for safety was required for transition to EOB, and +2 physical assist will be required for transition OOB with therapy next session. Pt reports she was independent PTA, but was not able to provide many details of the home environment. Pt will benefit from skilled PT to increase their independence and safety with mobility to allow discharge to the venue listed below. Recommending CIR consult at this time as I feel pain was limiting functional mobility, however if pt does not show improvement next session may need to consider a higher level of care at d/c.     Follow Up Recommendations CIR    Equipment Recommendations  None recommended by PT    Recommendations for Other Services Rehab consult     Precautions / Restrictions Precautions Precautions: Fall Restrictions Weight Bearing Restrictions: No      Mobility  Bed Mobility Overal bed mobility: Needs Assistance Bed Mobility: Rolling;Supine to Sit;Sit to Sidelying Rolling: Supervision   Supine to sit: Min assist    Sit to sidelying: Mod assist General bed mobility comments: Assist for trunk elevation to full sitting position, as well as management of LLE off bed/elevation back to bed. Heavy use of rails and LUE inattention.   Transfers                 General transfer comment: Did not attempt further transfer OOB at this time due to pt's inability to sit EOB due to pain, and decreased ability to follow commands at this time (appears to be behavioral more than true deficits.)  Ambulation/Gait                Stairs            Wheelchair Mobility    Modified Rankin (Stroke Patients Only) Modified Rankin (Stroke Patients Only) Pre-Morbid Rankin Score: No significant disability Modified Rankin: Moderately severe disability     Balance Overall balance assessment: Needs assistance Sitting-balance support: Bilateral upper extremity supported;Feet supported Sitting balance-Leahy Scale: Poor   Postural control: Posterior lean;Right lateral lean;Left lateral lean                                   Pertinent Vitals/Pain Pain Assessment: Faces Faces Pain Scale: Hurts whole lot Pain Location: Back Pain Descriptors / Indicators: Restless;Crying;Moaning Pain Intervention(s): Limited activity within patient's tolerance;Monitored during session;Repositioned    Home Living Family/patient expects to be discharged to:: Private residence Living Arrangements: Children;Other relatives Available Help at Discharge: Family;Available PRN/intermittently Type of Home: House  Home Access: Stairs to enter Entrance Stairs-Rails: None Entrance Stairs-Number of Steps: 3 Home Layout: One level Home Equipment: Walker - 2 wheels;Cane - quad;Bedside commode Additional Comments: Most details of home environment were taken from a previous admission. Poor historian at time of eval.     Prior Function Level of Independence: Independent         Comments: Per pt, able to      Hand  Dominance   Dominant Hand: Right    Extremity/Trunk Assessment   Upper Extremity Assessment Upper Extremity Assessment: LUE deficits/detail LUE Deficits / Details: Decreased strength and AROM - see OT eval for details    Lower Extremity Assessment Lower Extremity Assessment: LLE deficits/detail LLE Deficits / Details: Decreased strength and AROM. Was able to demonstrate at least 3/5 strength in hip flexors and quads.        Communication   Communication: No difficulties  Cognition Arousal/Alertness: Awake/alert Behavior During Therapy: Restless;Anxious;Impulsive Overall Cognitive Status: No family/caregiver present to determine baseline cognitive functioning                      General Comments      Exercises     Assessment/Plan    PT Assessment Patient needs continued PT services  PT Problem List Decreased strength;Decreased range of motion;Decreased activity tolerance;Decreased balance;Decreased mobility;Decreased knowledge of use of DME;Decreased safety awareness;Decreased knowledge of precautions;Pain          PT Treatment Interventions DME instruction;Gait training;Stair training;Therapeutic activities;Functional mobility training;Therapeutic exercise;Neuromuscular re-education;Patient/family education    PT Goals (Current goals can be found in the Care Plan section)  Acute Rehab PT Goals Patient Stated Goal: Decrease pain PT Goal Formulation: With patient Time For Goal Achievement: 12/30/16 Potential to Achieve Goals: Good    Frequency Min 4X/week   Barriers to discharge        Co-evaluation               End of Session   Activity Tolerance: Patient limited by pain Patient left: in bed;with call bell/phone within reach;with bed alarm set Nurse Communication: Mobility status;Patient requests pain meds         Time: 1034-1050 PT Time Calculation (min) (ACUTE ONLY): 16 min   Charges:   PT Evaluation $PT Eval Moderate Complexity: 1  Procedure     PT G Codes:        Thelma Comp January 03, 2017, 1:35 PM  Rolinda Roan, PT, DPT Acute Rehabilitation Services Pager: [phone]

## 2016-12-16 NOTE — Progress Notes (Signed)
STROKE TEAM PROGRESS NOTE   HISTORY OF PRESENT ILLNESS (per record) Ellen Henry is an 71 y.o. female who presented for acute onset of stuttering TIA symptoms starting at 3 PM today 09/14/2017. She was at home in usual state of health when she suddenly became diffusely weak. She denies that she had lateralized weakness. The diffuse weakness made it difficult for her to get back into bed to rest. Her symptoms resolved but then recurred; she called EMS who noted left facial droop and left facial weakness, which had resolved per ED staff at time of presentation to [facility]. NIHSS: 1.  Patient was not administered IV t-PA secondary to Due to being out of IV tPA time window.   Called back to ED for reactivation of Code Stroke at 22:45. Nurse was performing swallow screen, the patient started yawning, eyes started watering, left arm became flaccid and left facial droop was noted. LLE also with recurrent weakness.   Neurological Exam:  Ment: Cognition and speech unchanged.  CN: Left facial droop seen, new since prior exam.  Motor: LUE 2-5. LLE 2/5. RUE and RLE 4+/5. Left sided findings are new since prior exam.  A/R: 1. Reactivated Code Stroke for recurrent left sided weakness and facial droop.  2. Exam and symptoms best localize to the right MCA territory. 3. STAT follow up CT with CTA and CT perfusion ordered.   CTA H&N, Perfusion - no acute infarct or perfusion abnormality.  Patient was not found to be an endovascular candidate. She was admitted for further evaluation and treatment.   SUBJECTIVE (INTERVAL HISTORY) Her family is at the bedside.  Overall she feels her condition is gradually worsening. The nurse reports she is uncomfortable from back pain, agitated, restless all morning. She did poorly with her swallow eval.  Dr. Allyson Sabal also present during rounds.    OBJECTIVE Temp:  [97.9 F (36.6 C)-98.6 F (37 C)] 98.4 F (36.9 C) (01/10 0832) Pulse Rate:  [62-89] 89 (01/10 0832) Cardiac  Rhythm: Normal sinus rhythm (01/10 0820) Resp:  [13-20] 20 (01/10 0832) BP: (110-175)/(77-114) 175/100 (01/10 0832) SpO2:  [96 %-100 %] 96 % (01/10 0832) Weight:  [104.3 kg (230 lb)] 104.3 kg (230 lb) (01/09 2128)  CBC:  Recent Labs Lab 12/15/16 2103 12/15/16 2111  WBC 11.0*  --   NEUTROABS 3.7  --   HGB 12.1 12.2  HCT 35.8* 36.0  MCV 82.5  --   PLT 227  --     Basic Metabolic Panel:  Recent Labs Lab 12/15/16 2103 12/15/16 2111 12/16/16 0807  NA 146* 145 145  K 2.9* 2.8* 2.9*  CL 107 106 109  CO2 27  --  29  GLUCOSE 105* 107* 101*  BUN 9 10 5*  CREATININE 0.79 0.80 0.62  CALCIUM 9.1  --  9.2  MG  --   --  1.8    Lipid Panel:    Component Value Date/Time   CHOL 224 (H) 12/16/2016 0807   TRIG 61 12/16/2016 0807   HDL 65 12/16/2016 0807   CHOLHDL 3.4 12/16/2016 0807   VLDL 12 12/16/2016 0807   LDLCALC 147 (H) 12/16/2016 0807   HgbA1c: No results found for: HGBA1C Urine Drug Screen:    Component Value Date/Time   LABOPIA NONE DETECTED 12/15/2016 2143   COCAINSCRNUR NONE DETECTED 12/15/2016 2143   LABBENZ NONE DETECTED 12/15/2016 2143   AMPHETMU NONE DETECTED 12/15/2016 2143   THCU NONE DETECTED 12/15/2016 2143   LABBARB NONE DETECTED 12/15/2016 2143  IMAGING  Ct Angio Head W Or Wo Contrast  Result Date: 12/16/2016 CLINICAL DATA:  Weakness EXAM: CT PERFUSION BRAIN CT ANGIOGRAPHY HEAD CT ANGIOGRAPHY NECK TECHNIQUE: Multiphase CT imaging of the brain was performed following IV bolus contrast injection. Subsequent parametric perfusion maps were calculated using RAPID software. Multidetector CT imaging of the head and neck was performed using the standard protocol during bolus administration of intravenous contrast. Multiplanar CT image reconstructions and MIPs were obtained to evaluate the vascular anatomy. Carotid stenosis measurements (when applicable) are obtained utilizing NASCET criteria, using the distal internal carotid diameter as the denominator.  CONTRAST:  90 mL Isovue 370 IV COMPARISON:  None. FINDINGS: CT HEAD FINDINGS Brain: No mass lesion, intraparenchymal hemorrhage or extra-axial collection. No evidence of acute cortical infarct. AREAS of hypoattenuation in the right basal ganglia and left frontal white matter are suggestive of chronic infarcts. Vascular: No hyperdense vessel or unexpected calcification. Skull: Normal visualized skull base, calvarium and extracranial soft tissues. Sinuses/Orbits: Ethmoid and maxillary mild mucosal thickening with atelectatic right maxillary sinus. Normal orbits. CTA NECK FINDINGS Aortic arch: There is no aneurysm or dissection of the visualized ascending aorta or aortic arch. There is a normal variant aortic arch branching pattern with the brachiocephalic and left common carotid arteries sharing a common origin. The proximal subclavian arteries are patent. There is mild calcification of the aortic arch. Right carotid system: The right common carotid origin is widely patent. There is no common carotid or internal carotid artery dissection or aneurysm. There is mild atherosclerotic calcification at the carotid bifurcation without hemodynamically significant stenosis. Left carotid system: The left common carotid origin is widely patent. There is no common carotid or internal carotid artery dissection or aneurysm. There is mild atherosclerotic calcification at the carotid bifurcation without hemodynamically significant stenosis. Vertebral arteries: The vertebral system is mildly right-dominant. Both vertebral artery origins are normal. Both vertebral arteries are normal to their confluence with the basilar artery. Skeleton: There is no bony spinal canal stenosis. No lytic or blastic lesions. Other neck: The nasopharynx is clear. The oropharynx and hypopharynx are normal. The epiglottis is normal. The supraglottic larynx, glottis and subglottic larynx are normal. No retropharyngeal collection. The parapharyngeal spaces are  preserved. The parotid and submandibular glands are normal. No sialolithiasis or salivary ductal dilatation. The thyroid gland is normal. There is no cervical lymphadenopathy. Upper chest: No pneumothorax or pleural effusion. No nodules or masses. Review of the MIP images confirms the above findings CTA HEAD FINDINGS Anterior circulation: --Intracranial internal carotid arteries: There is atherosclerotic calcification of the bilateral cavernous and clinoid segments without advanced stenosis. 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 Otherwise normal. --Middle cerebral arteries: Normal. --Posterior communicating arteries: Small P-comm is present on the right. Posterior circulation: --Posterior cerebral arteries: Normal. --Superior cerebellar arteries: Normal. --Basilar artery: Normal. --Anterior inferior cerebellar arteries: Normal. --Posterior inferior cerebellar arteries: Normal. Venous sinuses: As permitted by contrast timing, patent. Anatomic variants: Absent right anterior cerebral artery A1 segment, a normal variant. Review of the MIP images confirms the above findings CT Brain Perfusion Findings: CBF (<30%) Volume: 41mL Perfusion (Tmax>6.0s) volume: 69mL Mismatch Volume: 51mL Infarction Location:None IMPRESSION: 1. No perfusion abnormality or other evidence of acute infarct. 2. No intracranial arterial occlusion or high-grade stenosis. 3. Mild carotid bifurcation atherosclerosis without hemodynamically significant stenosis. 4. Mild aortic atherosclerosis. Electronically Signed   By: Ulyses Jarred M.D.   On: 12/16/2016 01:02   Dg Chest 2 View  Result Date: 12/15/2016  CLINICAL DATA:  Productive cough, shortness of breath for 2-3 months. History of hypertension, pneumonia, smoker. EXAM: CHEST  2 VIEW COMPARISON:  Chest radiograph October 26, 2014 FINDINGS: Cardiac silhouette is normal. Calcified aortic knob. Unchanged fullness the RIGHT hilum. No pleural effusion or  focal consolidation. No pneumothorax. Soft tissue planes and included osseous structures are nonsuspicious, mild degenerative change of the thoracic spine. Partially imaged lumbar hardware. IMPRESSION: No acute cardiopulmonary process, stable examination. Electronically Signed   By: Elon Alas M.D.   On: 12/15/2016 22:00   Ct Head Wo Contrast  Result Date: 12/15/2016 CLINICAL DATA:  Code stroke.  71 y/o  F; left-sided weakness. EXAM: CT HEAD WITHOUT CONTRAST TECHNIQUE: Contiguous axial images were obtained from the base of the skull through the vertex without intravenous contrast. COMPARISON:  07/21/2005 CT head. FINDINGS: Brain: No evidence for large territory infarct, intracranial hemorrhage, or focal mass effect. There are several small lucencies in the basal ganglia bilaterally and in the left anterior and right posterior corona radiata compatible with age-indeterminate lacunar infarcts. There are mild chronic microvascular ischemic changes and mild volume loss of the brain parenchyma Vascular: No hyperdense vessel. Moderate calcific atherosclerosis of skullbase internal carotid arteries. Skull: Normal. Negative for fracture or focal lesion. Sinuses/Orbits: Mild diffuse paranasal sinus mucosal thickening. Normally pneumatized mastoid air cells. Orbits are unremarkable. Other: None. ASPECTS [facility] Stroke Program Early CT Score) - Ganglionic level infarction (caudate, lentiform nuclei, internal capsule, insula, M1-M3 cortex): 7 - Supraganglionic infarction (M4-M6 cortex): 3 Total score (0-10 with 10 being normal): 10 IMPRESSION: 1. No evidence for large territory infarct, intracranial hemorrhage, or focal mass effect. 2. Several new small lucencies from 2006 in the basal ganglia bilaterally as well as the left anterior and right posterior corona radiata are compatible with age-indeterminate lacunar infarcts. There are mild chronic microvascular ischemic changes and mild volume loss of the brain  parenchyma 3. ASPECTS is 10 These results were called by telephone at the time of interpretation on 12/15/2016 at 9:19 pm to Dr. Cheral Marker who verbally acknowledged these results. Electronically Signed   By: Kristine Garbe M.D.   On: 12/15/2016 21:21   Ct Angio Neck W And/or Wo Contrast  Result Date: 12/16/2016 CLINICAL DATA:  Weakness EXAM: CT PERFUSION BRAIN CT ANGIOGRAPHY HEAD CT ANGIOGRAPHY NECK TECHNIQUE: Multiphase CT imaging of the brain was performed following IV bolus contrast injection. Subsequent parametric perfusion maps were calculated using RAPID software. Multidetector CT imaging of the head and neck was performed using the standard protocol during bolus administration of intravenous contrast. Multiplanar CT image reconstructions and MIPs were obtained to evaluate the vascular anatomy. Carotid stenosis measurements (when applicable) are obtained utilizing NASCET criteria, using the distal internal carotid diameter as the denominator. CONTRAST:  90 mL Isovue 370 IV COMPARISON:  None. FINDINGS: CT HEAD FINDINGS Brain: No mass lesion, intraparenchymal hemorrhage or extra-axial collection. No evidence of acute cortical infarct. AREAS of hypoattenuation in the right basal ganglia and left frontal white matter are suggestive of chronic infarcts. Vascular: No hyperdense vessel or unexpected calcification. Skull: Normal visualized skull base, calvarium and extracranial soft tissues. Sinuses/Orbits: Ethmoid and maxillary mild mucosal thickening with atelectatic right maxillary sinus. Normal orbits. CTA NECK FINDINGS Aortic arch: There is no aneurysm or dissection of the visualized ascending aorta or aortic arch. There is a normal variant aortic arch branching pattern with the brachiocephalic and left common carotid arteries sharing a common origin. The proximal subclavian arteries are patent. There is mild calcification of the aortic  arch. Right carotid system: The right common carotid origin is  widely patent. There is no common carotid or internal carotid artery dissection or aneurysm. There is mild atherosclerotic calcification at the carotid bifurcation without hemodynamically significant stenosis. Left carotid system: The left common carotid origin is widely patent. There is no common carotid or internal carotid artery dissection or aneurysm. There is mild atherosclerotic calcification at the carotid bifurcation without hemodynamically significant stenosis. Vertebral arteries: The vertebral system is mildly right-dominant. Both vertebral artery origins are normal. Both vertebral arteries are normal to their confluence with the basilar artery. Skeleton: There is no bony spinal canal stenosis. No lytic or blastic lesions. Other neck: The nasopharynx is clear. The oropharynx and hypopharynx are normal. The epiglottis is normal. The supraglottic larynx, glottis and subglottic larynx are normal. No retropharyngeal collection. The parapharyngeal spaces are preserved. The parotid and submandibular glands are normal. No sialolithiasis or salivary ductal dilatation. The thyroid gland is normal. There is no cervical lymphadenopathy. Upper chest: No pneumothorax or pleural effusion. No nodules or masses. Review of the MIP images confirms the above findings CTA HEAD FINDINGS Anterior circulation: --Intracranial internal carotid arteries: There is atherosclerotic calcification of the bilateral cavernous and clinoid segments without advanced stenosis. 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 Otherwise normal. --Middle cerebral arteries: Normal. --Posterior communicating arteries: Small P-comm is present on the right. Posterior circulation: --Posterior cerebral arteries: Normal. --Superior cerebellar arteries: Normal. --Basilar artery: Normal. --Anterior inferior cerebellar arteries: Normal. --Posterior inferior cerebellar arteries: Normal. Venous sinuses: As permitted  by contrast timing, patent. Anatomic variants: Absent right anterior cerebral artery A1 segment, a normal variant. Review of the MIP images confirms the above findings CT Brain Perfusion Findings: CBF (<30%) Volume: 12mL Perfusion (Tmax>6.0s) volume: 40mL Mismatch Volume: 5mL Infarction Location:None IMPRESSION: 1. No perfusion abnormality or other evidence of acute infarct. 2. No intracranial arterial occlusion or high-grade stenosis. 3. Mild carotid bifurcation atherosclerosis without hemodynamically significant stenosis. 4. Mild aortic atherosclerosis. Electronically Signed   By: Ulyses Jarred M.D.   On: 12/16/2016 01:02   Mr Brain Wo Contrast  Result Date: 12/16/2016 CLINICAL DATA:  Left-sided weakness. EXAM: MRI HEAD WITHOUT CONTRAST TECHNIQUE: Multiplanar, multiecho pulse sequences of the brain and surrounding structures were obtained without intravenous contrast. COMPARISON:  CT head 12/16/2016. FINDINGS: The patient was not able to complete the study. Axial diffusion-weighted imaging demonstrates small area of acute infarct posterior limb internal capsule on the right. Axial diffusion is degraded by motion. No other imaging was performed. There is chronic ischemic change in the deep white matter bilaterally. No hydrocephalus. IMPRESSION: Incomplete and limited study Acute infarct posterior limb internal capsule on the right. Electronically Signed   By: Franchot Gallo M.D.   On: 12/16/2016 08:10   Ct Cerebral Perfusion W Contrast  Result Date: 12/16/2016 CLINICAL DATA:  Weakness EXAM: CT PERFUSION BRAIN CT ANGIOGRAPHY HEAD CT ANGIOGRAPHY NECK TECHNIQUE: Multiphase CT imaging of the brain was performed following IV bolus contrast injection. Subsequent parametric perfusion maps were calculated using RAPID software. Multidetector CT imaging of the head and neck was performed using the standard protocol during bolus administration of intravenous contrast. Multiplanar CT image reconstructions and MIPs were  obtained to evaluate the vascular anatomy. Carotid stenosis measurements (when applicable) are obtained utilizing NASCET criteria, using the distal internal carotid diameter as the denominator. CONTRAST:  90 mL Isovue 370 IV COMPARISON:  None. FINDINGS: CT HEAD FINDINGS Brain: No mass lesion, intraparenchymal hemorrhage or extra-axial collection.  No evidence of acute cortical infarct. AREAS of hypoattenuation in the right basal ganglia and left frontal white matter are suggestive of chronic infarcts. Vascular: No hyperdense vessel or unexpected calcification. Skull: Normal visualized skull base, calvarium and extracranial soft tissues. Sinuses/Orbits: Ethmoid and maxillary mild mucosal thickening with atelectatic right maxillary sinus. Normal orbits. CTA NECK FINDINGS Aortic arch: There is no aneurysm or dissection of the visualized ascending aorta or aortic arch. There is a normal variant aortic arch branching pattern with the brachiocephalic and left common carotid arteries sharing a common origin. The proximal subclavian arteries are patent. There is mild calcification of the aortic arch. Right carotid system: The right common carotid origin is widely patent. There is no common carotid or internal carotid artery dissection or aneurysm. There is mild atherosclerotic calcification at the carotid bifurcation without hemodynamically significant stenosis. Left carotid system: The left common carotid origin is widely patent. There is no common carotid or internal carotid artery dissection or aneurysm. There is mild atherosclerotic calcification at the carotid bifurcation without hemodynamically significant stenosis. Vertebral arteries: The vertebral system is mildly right-dominant. Both vertebral artery origins are normal. Both vertebral arteries are normal to their confluence with the basilar artery. Skeleton: There is no bony spinal canal stenosis. No lytic or blastic lesions. Other neck: The nasopharynx is clear.  The oropharynx and hypopharynx are normal. The epiglottis is normal. The supraglottic larynx, glottis and subglottic larynx are normal. No retropharyngeal collection. The parapharyngeal spaces are preserved. The parotid and submandibular glands are normal. No sialolithiasis or salivary ductal dilatation. The thyroid gland is normal. There is no cervical lymphadenopathy. Upper chest: No pneumothorax or pleural effusion. No nodules or masses. Review of the MIP images confirms the above findings CTA HEAD FINDINGS Anterior circulation: --Intracranial internal carotid arteries: There is atherosclerotic calcification of the bilateral cavernous and clinoid segments without advanced stenosis. 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 Otherwise normal. --Middle cerebral arteries: Normal. --Posterior communicating arteries: Small P-comm is present on the right. Posterior circulation: --Posterior cerebral arteries: Normal. --Superior cerebellar arteries: Normal. --Basilar artery: Normal. --Anterior inferior cerebellar arteries: Normal. --Posterior inferior cerebellar arteries: Normal. Venous sinuses: As permitted by contrast timing, patent. Anatomic variants: Absent right anterior cerebral artery A1 segment, a normal variant. Review of the MIP images confirms the above findings CT Brain Perfusion Findings: CBF (<30%) Volume: 53mL Perfusion (Tmax>6.0s) volume: 33mL Mismatch Volume: 70mL Infarction Location:None IMPRESSION: 1. No perfusion abnormality or other evidence of acute infarct. 2. No intracranial arterial occlusion or high-grade stenosis. 3. Mild carotid bifurcation atherosclerosis without hemodynamically significant stenosis. 4. Mild aortic atherosclerosis. Electronically Signed   By: Ulyses Jarred M.D.   On: 12/16/2016 01:02    PHYSICAL EXAM Pleasant elderly african american lady not in distress. . Afebrile. Head is nontraumatic. Neck is supple without bruit.    Cardiac  exam no murmur or gallop. Lungs are clear to auscultation. Distal pulses are well felt. Neurological Exam :  Awake alert oriented x 3. Mild dysarthria and left lower face weakness.eye movements are full range without nystagmus.no field cut. Acuity adequate. Fundi not visualized. Left hemiplegia with grade 2/5 strength in the left upper extremity with drift and 3/5 left lower extremity strength. Diminished left hemibody sensation. Left plantar upgoing left right downgoing. Normal strength in the right. Gait not tested. ASSESSMENT/PLAN Ellen Henry is a 71 y.o. female with history of HTN, HLD and back surgery presenting with L facial who developed L hemiparesis in  the hospital. She did not receive IV t-PA due to being out of the window.   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 Continue at discharge  Patient counseled to be compliant with her antithrombotic medications  Ongoing aggressive stroke risk factor management  Therapy recommendations:  CIR. Consult placed  Disposition:  pending   Hypertension  Stable  Permissive hypertension (OK if < 220/120) but gradually normalize in 5-7 days  Long-term BP goal normotensive  Hyperlipidemia  Home meds:  No statin  LDL 147, goal < 70  Now on lipitor 40  Continue statin at discharge  Other Stroke Risk Factors  Advanced age  Cigarette smoker, advised to stop smoking  Obesity, Body mass index is 34.97 kg/m., recommend weight  loss, diet and exercise as appropriate   Obstructive sleep apnea  Other Active Problems  Back pain - hx back surgery - needs IVF and pain control  Hospital day # 0  Radene Journey [facility] [place] for Pager information 12/16/2016 11:30 AM  I have personally examined this patient, reviewed notes, independently viewed imaging studies, participated in medical decision making and plan of care.ROS completed by me personally and pertinent positives fully documented  I have made any additions or clarifications directly to the above note. Agree with note above. She presented with a stuttering course of left hemiparesis due to right subcortical infarct from small vessel disease. Recommend aspirin for stroke prevention and continue ongoing stroke workup. Physical occupational therapy and rehabilitation consult. Long discussion of the bedside with the patient and multiple family members and answered questions. Greater than 50% time during this 35 minute visit was spent on counseling and coordination of care about stroke, recurrence, prevention and treatment  Antony Contras, MD Medical Director [place] Pager: [phone] 12/16/2016 3:58 PM  To contact Stroke Continuity provider, please refer to [email or web address] After hours, contact General Neurology

## 2016-12-16 NOTE — ED Notes (Signed)
Pt back to room, used bedpan.  PT began to yawn several times reports arm still feels "funny".

## 2016-12-16 NOTE — Progress Notes (Signed)
code stroke reactivated at 2245 for new symptoms. Rn attempted to perform swallow screen when pt began yawning, eyes began watering, Left arm became flaccid with Left side facial droop. NIHSS 4, LKW 2245, CBG 103. Pt admitted by [facility]

## 2016-12-16 NOTE — Progress Notes (Addendum)
Patient arrived to unit via ED RN and daughter, vitals stable. Tele applied and verified. Patient oriented to unit/room. Questions answered. On call paged in regards to new onset h/a. Will continue to monitor.   [facility] MD stated to give tylenol, get patient to MRI as soon as possible and to let neurology know.  Neuro MD orders phenergan now and chem lab in AM regarding potassium level.  Continue to monitor patient.

## 2016-12-16 NOTE — Consult Note (Signed)
Physical Medicine and Rehabilitation Consult   Reason for Consult: Left sided weakness and dysphagia.  Referring Physician: Dr. Leonie Man   HPI: Ellen Henry is a 71 y.o. female with history of HTN, chronic back pain with radiculopathy, OSA who was admitted on 12/14/16 with complaints of diffuse weakness. She was found to have left facial weakness on evaluation by EMS and symptoms resolved in ED therefore tPA not administered. CT head negative.  She developed left sided weakness later that evening and CTA head/neck without perfusion abnormality or occlusion/high grade stenosis. Therefore not endovascular candidate. 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 She was started on ASA for secondary stroke prevention and Work up initiated. Therapy evaluations done and limited by pain as well as decreased balance at EOB. BSS with signs of aspiration and patient placed on dysphagia 2, nectar liquids. She has had difficulty voiding with  PVR .700cc this am.  CIR recommended for follow up therapy.   Review of Systems  HENT: Negative for hearing loss and tinnitus.   Eyes: Negative for blurred vision and double vision.  Respiratory: Negative for cough, shortness of breath and wheezing.   Cardiovascular: Negative for chest pain and palpitations.  Gastrointestinal: Positive for constipation (uses suppository every other day). Negative for heartburn and nausea.  Genitourinary: Negative for dysuria and urgency.       H/o urinary retention post op  Musculoskeletal: Positive for back pain (radiates to RLE--out of neurontin) and joint pain (chronic knee pain).  Skin: Negative for itching and rash.  Neurological: Positive for speech change, focal weakness, weakness and headaches (since stroke).  Psychiatric/Behavioral: Negative for depression. The patient is nervous/anxious and has insomnia.       Past Medical History:  Diagnosis Date  . Arthritis   . Dry  skin   . GERD (gastroesophageal reflux disease)    "years ago", diet controlled  . Gout   . H/O pyelonephritis    as a child  . 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 Hypertension   . Neuropathy ([place])   . Pneumonia   . Radiculopathy   . Sleep apnea    does not use cpap    Past Surgical History:  Procedure Laterality Date  . ABDOMINAL EXPOSURE N/A 08/29/2014   Procedure: ABDOMINAL EXPOSURE;  Surgeon: Rosetta Posner, MD;  Location: [facility];  Service: Vascular;  Laterality: N/A;  . ABDOMINAL HYSTERECTOMY    . ANTERIOR LUMBAR FUSION N/A 08/29/2014   Procedure: ANTERIOR LUMBAR FUSION 1 LEVEL;  Surgeon: Sinclair Ship, MD;  Location: [facility];  Service: Orthopedics;  Laterality: N/A;  Lumbar 4-5 anterior lumbar interbody fusion with allograft and instrumentation.  . APPENDECTOMY    . BACK SURGERY  08/28/2014 and 08/29/2014   lumbar fusion  . CARPAL TUNNEL RELEASE Right    release done twice  . CARPAL TUNNEL RELEASE Right    x 2  . COLONOSCOPY    . JOINT REPLACEMENT    . JOINT REPLACEMENT     right knee  . REPLACEMENT TOTAL KNEE  2010   rigth knee  . TONSILLECTOMY    . TOTAL KNEE ARTHROPLASTY Left 02/15/2015   Procedure: TOTAL KNEE ARTHROPLASTY;  Surgeon: [name], MD;  Location: [facility];  Service: Orthopedics;  Laterality: Left;    Family History  Problem Relation Age of Onset  . Varicose Veins Mother     Social History:  Lives with daughter and grandchildren. 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 Helps take care of grandchildren. Does an exercise program twice a week. She reports that she has been smoking Cigarettes --almost a pack per day. She has never used smokeless tobacco. She reports that she does not drink alcohol or use drugs.   Allergies  Allergen Reactions  . Food Allergy Formula Other (See Comments)    Shellfish-banana-beef-flares up her gout    Medications Prior to Admission  Medication Sig Dispense Refill  . acetaminophen (TYLENOL) 500 MG  tablet Take 1,000 mg by mouth every 6 (six) hours as needed for mild pain.    Marland Kitchen aspirin EC 81 MG tablet Take 81 mg by mouth every 6 (six) hours as needed for mild pain.    Marland Kitchen colchicine 0.6 MG tablet Take 1 tablet (0.6 mg total) by mouth daily. (Patient taking differently: Take 0.6 mg by mouth daily as needed (pain). ) 1 tablet 0  . ibuprofen (ADVIL,MOTRIN) 200 MG tablet Take 400 mg by mouth every 6 (six) hours as needed for moderate pain.    . indomethacin (INDOCIN) 25 MG capsule Take 1 po TID with food x 3 d then 1 po BID x 3d then 1 po QD x 3d (Patient taking differently: Take 25 mg by mouth daily as needed for mild pain. ) 18 capsule 0    Home: Home Living Family/patient expects to be discharged to:: Private residence Living Arrangements: Children, Other relatives Available Help at Discharge: Family, Available PRN/intermittently Type of Home: House Home Access: Stairs to enter Technical brewer of Steps: 3 Entrance Stairs-Rails: None Home Layout: One level Bathroom Shower/Tub: Chiropodist: Standard Bathroom Accessibility: Yes Home Equipment: Environmental consultant - 2 wheels, Cane - quad, Bedside commode Additional Comments: Most details of home environment were taken from a previous admission. Poor historian at time of eval.   Functional History: Prior Function Level of Independence: Independent Comments: Per pt, able to  Functional Status:  Mobility: Bed Mobility Overal bed mobility: Needs Assistance Bed Mobility: Rolling, Supine to Sit, Sit to Sidelying Rolling: Supervision Supine to sit: Min assist Sit to sidelying: Mod assist General bed mobility comments: Assist for trunk elevation to full sitting position, as well as management of LLE off bed/elevation back to bed. Heavy use of rails and LUE inattention.  Transfers General transfer comment: Did not attempt further transfer OOB at this time due to pt's inability to sit EOB due to pain, and decreased ability to  follow commands at this time (appears to be behavioral more than true deficits.)      ADL: ADL Overall ADL's : Needs assistance/impaired Eating/Feeding Details (indicate cue type and reason): Pt NPO Grooming: Moderate assistance, Sitting Upper Body Bathing: Maximal assistance, Sitting Lower Body Bathing: Maximal assistance, Sitting/lateral leans Upper Body Dressing : Maximal assistance, Sitting Lower Body Dressing: Maximal assistance, Sitting/lateral leans General ADL Comments: Pt unable to tolerate more than sitting at EOB due to reported back pain. Leaning side to side frequently potentially due to pain. Decreased awareness of L UE and noted this becoming pinned behind her when completing bed mobility.  Cognition: Cognition Overall Cognitive Status: No family/caregiver present to determine baseline cognitive functioning Orientation Level: Oriented to person, Oriented to place, Oriented to situation, Disoriented to time Cognition Arousal/Alertness: Awake/alert Behavior During Therapy: Restless, Anxious, Impulsive Overall Cognitive Status: No family/caregiver present to determine baseline cognitive functioning General Comments: Pt writhing in bed. Alert and oriented but perseverating on pain.   Blood pressure (!) 158/87,  pulse 82, temperature 98.3 F (36.8 C), temperature source Oral, resp. rate 20, height 5\' 8"  (1.727 m), weight 104.3 kg (230 lb), SpO2 97 %. Physical Exam  Nursing note and vitals reviewed. Constitutional: She is oriented to person, place, and time. She appears well-nourished.  Up in bed eating breakfast.   HENT:  Head: Normocephalic and atraumatic.  Mouth/Throat: Oropharynx is clear and moist.  Eyes: Conjunctivae and EOM are normal. Pupils are equal, round, and reactive to light.  Neck: Normal range of motion. Neck supple.  Cardiovascular: Normal rate and regular rhythm.   No murmur heard. Respiratory: Effort normal and breath sounds normal. No stridor. She  has no wheezes. She has no rales.  GI: Bowel sounds are normal. She exhibits no distension. There is no tenderness.  Musculoskeletal: She exhibits no edema or tenderness.  Well healed old B-TKR incisions.   Neurological: She is alert and oriented to person, place, and time.  Left facial droop with mild dysarthria. No obvious tongue deviation. Visual fields intact.. Able to answer orientation questions without difficulty. Reasonable insight and awareness. Has poor postural reflexes but able to self correct in bed. Mild left inattention. LUE: deltoid 2-, bicep 2-, tricep 1, wrist/hand tr to 1-. LLE: 2-HF, 2KE and trace ADF/PF. Sensation intact left leg arm.   Skin: Skin is warm and dry.  Psychiatric: She has a normal mood and affect. Her behavior is normal. Thought content normal.    Results for orders placed or performed during the hospital encounter of 12/15/16 (from the past 24 hour(s))  Basic metabolic panel     Status: None   Collection Time: 12/16/16  7:29 PM  Result Value Ref Range   Sodium 141 135 - 145 mmol/L   Potassium 3.5 3.5 - 5.1 mmol/L   Chloride 105 101 - 111 mmol/L   CO2 28 22 - 32 mmol/L   Glucose, Bld 98 65 - 99 mg/dL   BUN 6 6 - 20 mg/dL   Creatinine, Ser 0.62 0.44 - 1.00 mg/dL   Calcium 9.4 8.9 - 10.3 mg/dL   GFR calc non Af Amer >60 >60 mL/min   GFR calc Af Amer >60 >60 mL/min   Anion gap 8 5 - 15  CBC     Status: None   Collection Time: 12/17/16  6:36 AM  Result Value Ref Range   WBC 9.0 4.0 - 10.5 K/uL   RBC 4.69 3.87 - 5.11 MIL/uL   Hemoglobin 13.0 12.0 - 15.0 g/dL   HCT 38.8 36.0 - 46.0 %   MCV 82.7 78.0 - 100.0 fL   MCH 27.7 26.0 - 34.0 pg   MCHC 33.5 30.0 - 36.0 g/dL   RDW 14.6 11.5 - 15.5 %   Platelets 253 150 - 400 K/uL  Comprehensive metabolic panel     Status: Abnormal   Collection Time: 12/17/16  6:36 AM  Result Value Ref Range   Sodium 143 135 - 145 mmol/L   Potassium 3.3 (L) 3.5 - 5.1 mmol/L   Chloride 108 101 - 111 mmol/L   CO2 26 22 - 32  mmol/L   Glucose, Bld 106 (H) 65 - 99 mg/dL   BUN 6 6 - 20 mg/dL   Creatinine, Ser 0.65 0.44 - 1.00 mg/dL   Calcium 9.3 8.9 - 10.3 mg/dL   Total Protein 7.1 6.5 - 8.1 g/dL   Albumin 3.3 (L) 3.5 - 5.0 g/dL   AST 21 15 - 41 U/L   ALT 15 14 - 54  U/L   Alkaline Phosphatase 110 38 - 126 U/L   Total Bilirubin 0.8 0.3 - 1.2 mg/dL   GFR calc non Af Amer >60 >60 mL/min   GFR calc Af Amer >60 >60 mL/min   Anion gap 9 5 - 15   Ct Angio Head W Or Wo Contrast  Result Date: 12/16/2016 CLINICAL DATA:  Weakness EXAM: CT PERFUSION BRAIN CT ANGIOGRAPHY HEAD CT ANGIOGRAPHY NECK TECHNIQUE: Multiphase CT imaging of the brain was performed following IV bolus contrast injection. Subsequent parametric perfusion maps were calculated using RAPID software. Multidetector CT imaging of the head and neck was performed using the standard protocol during bolus administration of intravenous contrast. Multiplanar CT image reconstructions and MIPs were obtained to evaluate the vascular anatomy. Carotid stenosis measurements (when applicable) are obtained utilizing NASCET criteria, using the distal internal carotid diameter as the denominator. CONTRAST:  90 mL Isovue 370 IV COMPARISON:  None. FINDINGS: CT HEAD FINDINGS Brain: No mass lesion, intraparenchymal hemorrhage or extra-axial collection. No evidence of acute cortical infarct. AREAS of hypoattenuation in the right basal ganglia and left frontal white matter are suggestive of chronic infarcts. Vascular: No hyperdense vessel or unexpected calcification. Skull: Normal visualized skull base, calvarium and extracranial soft tissues. Sinuses/Orbits: Ethmoid and maxillary mild mucosal thickening with atelectatic right maxillary sinus. Normal orbits. CTA NECK FINDINGS Aortic arch: There is no aneurysm or dissection of the visualized ascending aorta or aortic arch. There is a normal variant aortic arch branching pattern with the brachiocephalic and left common carotid arteries sharing a  common origin. The proximal subclavian arteries are patent. There is mild calcification of the aortic arch. Right carotid system: The right common carotid origin is widely patent. There is no common carotid or internal carotid artery dissection or aneurysm. There is mild atherosclerotic calcification at the carotid bifurcation without hemodynamically significant stenosis. Left carotid system: The left common carotid origin is widely patent. There is no common carotid or internal carotid artery dissection or aneurysm. There is mild atherosclerotic calcification at the carotid bifurcation without hemodynamically significant stenosis. Vertebral arteries: The vertebral system is mildly right-dominant. Both vertebral artery origins are normal. Both vertebral arteries are normal to their confluence with the basilar artery. Skeleton: There is no bony spinal canal stenosis. No lytic or blastic lesions. Other neck: The nasopharynx is clear. The oropharynx and hypopharynx are normal. The epiglottis is normal. The supraglottic larynx, glottis and subglottic larynx are normal. No retropharyngeal collection. The parapharyngeal spaces are preserved. The parotid and submandibular glands are normal. No sialolithiasis or salivary ductal dilatation. The thyroid gland is normal. There is no cervical lymphadenopathy. Upper chest: No pneumothorax or pleural effusion. No nodules or masses. Review of the MIP images confirms the above findings CTA HEAD FINDINGS Anterior circulation: --Intracranial internal carotid arteries: There is atherosclerotic calcification of the bilateral cavernous and clinoid segments without advanced stenosis. 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 Otherwise normal. --Middle cerebral arteries: Normal. --Posterior communicating arteries: Small P-comm is present on the right. Posterior circulation: --Posterior cerebral arteries: Normal. --Superior cerebellar  arteries: Normal. --Basilar artery: Normal. --Anterior inferior cerebellar arteries: Normal. --Posterior inferior cerebellar arteries: Normal. Venous sinuses: As permitted by contrast timing, patent. Anatomic variants: Absent right anterior cerebral artery A1 segment, a normal variant. Review of the MIP images confirms the above findings CT Brain Perfusion Findings: CBF (<30%) Volume: 74mL Perfusion (Tmax>6.0s) volume: 46mL Mismatch Volume: 66mL Infarction Location:None IMPRESSION: 1. No perfusion abnormality or other evidence of acute  infarct. 2. No intracranial arterial occlusion or high-grade stenosis. 3. Mild carotid bifurcation atherosclerosis without hemodynamically significant stenosis. 4. Mild aortic atherosclerosis. Electronically Signed   By: Ulyses Jarred M.D.   On: 12/16/2016 01:02   Dg Chest 2 View  Result Date: 12/15/2016 CLINICAL DATA:  Productive cough, shortness of breath for 2-3 months. History of hypertension, pneumonia, smoker. EXAM: CHEST  2 VIEW COMPARISON:  Chest radiograph October 26, 2014 FINDINGS: Cardiac silhouette is normal. Calcified aortic knob. Unchanged fullness the RIGHT hilum. No pleural effusion or focal consolidation. No pneumothorax. Soft tissue planes and included osseous structures are nonsuspicious, mild degenerative change of the thoracic spine. Partially imaged lumbar hardware. IMPRESSION: No acute cardiopulmonary process, stable examination. Electronically Signed   By: Elon Alas M.D.   On: 12/15/2016 22:00   Ct Head Wo Contrast  Result Date: 12/15/2016 CLINICAL DATA:  Code stroke.  71 y/o  F; left-sided weakness. EXAM: CT HEAD WITHOUT CONTRAST TECHNIQUE: Contiguous axial images were obtained from the base of the skull through the vertex without intravenous contrast. COMPARISON:  07/21/2005 CT head. FINDINGS: Brain: No evidence for large territory infarct, intracranial hemorrhage, or focal mass effect. There are several small lucencies in the basal ganglia  bilaterally and in the left anterior and right posterior corona radiata compatible with age-indeterminate lacunar infarcts. There are mild chronic microvascular ischemic changes and mild volume loss of the brain parenchyma Vascular: No hyperdense vessel. Moderate calcific atherosclerosis of skullbase internal carotid arteries. Skull: Normal. Negative for fracture or focal lesion. Sinuses/Orbits: Mild diffuse paranasal sinus mucosal thickening. Normally pneumatized mastoid air cells. Orbits are unremarkable. Other: None. ASPECTS [facility] Stroke Program Early CT Score) - Ganglionic level infarction (caudate, lentiform nuclei, internal capsule, insula, M1-M3 cortex): 7 - Supraganglionic infarction (M4-M6 cortex): 3 Total score (0-10 with 10 being normal): 10 IMPRESSION: 1. No evidence for large territory infarct, intracranial hemorrhage, or focal mass effect. 2. Several new small lucencies from 2006 in the basal ganglia bilaterally as well as the left anterior and right posterior corona radiata are compatible with age-indeterminate lacunar infarcts. There are mild chronic microvascular ischemic changes and mild volume loss of the brain parenchyma 3. ASPECTS is 10 These results were called by telephone at the time of interpretation on 12/15/2016 at 9:19 pm to Dr. Cheral Marker who verbally acknowledged these results. Electronically Signed   By: Kristine Garbe M.D.   On: 12/15/2016 21:21   Ct Angio Neck W And/or Wo Contrast  Result Date: 12/16/2016 CLINICAL DATA:  Weakness EXAM: CT PERFUSION BRAIN CT ANGIOGRAPHY HEAD CT ANGIOGRAPHY NECK TECHNIQUE: Multiphase CT imaging of the brain was performed following IV bolus contrast injection. Subsequent parametric perfusion maps were calculated using RAPID software. Multidetector CT imaging of the head and neck was performed using the standard protocol during bolus administration of intravenous contrast. Multiplanar CT image reconstructions and MIPs were obtained to  evaluate the vascular anatomy. Carotid stenosis measurements (when applicable) are obtained utilizing NASCET criteria, using the distal internal carotid diameter as the denominator. CONTRAST:  90 mL Isovue 370 IV COMPARISON:  None. FINDINGS: CT HEAD FINDINGS Brain: No mass lesion, intraparenchymal hemorrhage or extra-axial collection. No evidence of acute cortical infarct. AREAS of hypoattenuation in the right basal ganglia and left frontal white matter are suggestive of chronic infarcts. Vascular: No hyperdense vessel or unexpected calcification. Skull: Normal visualized skull base, calvarium and extracranial soft tissues. Sinuses/Orbits: Ethmoid and maxillary mild mucosal thickening with atelectatic right maxillary sinus. Normal orbits. CTA NECK FINDINGS Aortic arch: There is  no aneurysm or dissection of the visualized ascending aorta or aortic arch. There is a normal variant aortic arch branching pattern with the brachiocephalic and left common carotid arteries sharing a common origin. The proximal subclavian arteries are patent. There is mild calcification of the aortic arch. Right carotid system: The right common carotid origin is widely patent. There is no common carotid or internal carotid artery dissection or aneurysm. There is mild atherosclerotic calcification at the carotid bifurcation without hemodynamically significant stenosis. Left carotid system: The left common carotid origin is widely patent. There is no common carotid or internal carotid artery dissection or aneurysm. There is mild atherosclerotic calcification at the carotid bifurcation without hemodynamically significant stenosis. Vertebral arteries: The vertebral system is mildly right-dominant. Both vertebral artery origins are normal. Both vertebral arteries are normal to their confluence with the basilar artery. Skeleton: There is no bony spinal canal stenosis. No lytic or blastic lesions. Other neck: The nasopharynx is clear. The oropharynx  and hypopharynx are normal. The epiglottis is normal. The supraglottic larynx, glottis and subglottic larynx are normal. No retropharyngeal collection. The parapharyngeal spaces are preserved. The parotid and submandibular glands are normal. No sialolithiasis or salivary ductal dilatation. The thyroid gland is normal. There is no cervical lymphadenopathy. Upper chest: No pneumothorax or pleural effusion. No nodules or masses. Review of the MIP images confirms the above findings CTA HEAD FINDINGS Anterior circulation: --Intracranial internal carotid arteries: There is atherosclerotic calcification of the bilateral cavernous and clinoid segments without advanced stenosis. 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 Otherwise normal. --Middle cerebral arteries: Normal. --Posterior communicating arteries: Small P-comm is present on the right. Posterior circulation: --Posterior cerebral arteries: Normal. --Superior cerebellar arteries: Normal. --Basilar artery: Normal. --Anterior inferior cerebellar arteries: Normal. --Posterior inferior cerebellar arteries: Normal. Venous sinuses: As permitted by contrast timing, patent. Anatomic variants: Absent right anterior cerebral artery A1 segment, a normal variant. Review of the MIP images confirms the above findings CT Brain Perfusion Findings: CBF (<30%) Volume: 41mL Perfusion (Tmax>6.0s) volume: 60mL Mismatch Volume: 29mL Infarction Location:None IMPRESSION: 1. No perfusion abnormality or other evidence of acute infarct. 2. No intracranial arterial occlusion or high-grade stenosis. 3. Mild carotid bifurcation atherosclerosis without hemodynamically significant stenosis. 4. Mild aortic atherosclerosis. Electronically Signed   By: Ulyses Jarred M.D.   On: 12/16/2016 01:02   Mr Brain Wo Contrast  Result Date: 12/16/2016 CLINICAL DATA:  Left-sided weakness. EXAM: MRI HEAD WITHOUT CONTRAST TECHNIQUE: Multiplanar, multiecho pulse  sequences of the brain and surrounding structures were obtained without intravenous contrast. COMPARISON:  CT head 12/16/2016. FINDINGS: The patient was not able to complete the study. Axial diffusion-weighted imaging demonstrates small area of acute infarct posterior limb internal capsule on the right. Axial diffusion is degraded by motion. No other imaging was performed. There is chronic ischemic change in the deep white matter bilaterally. No hydrocephalus. IMPRESSION: Incomplete and limited study Acute infarct posterior limb internal capsule on the right. Electronically Signed   By: Franchot Gallo M.D.   On: 12/16/2016 08:10   Ct Cerebral Perfusion W Contrast  Result Date: 12/16/2016 CLINICAL DATA:  Weakness EXAM: CT PERFUSION BRAIN CT ANGIOGRAPHY HEAD CT ANGIOGRAPHY NECK TECHNIQUE: Multiphase CT imaging of the brain was performed following IV bolus contrast injection. Subsequent parametric perfusion maps were calculated using RAPID software. Multidetector CT imaging of the head and neck was performed using the standard protocol during bolus administration of intravenous contrast. Multiplanar CT image reconstructions and MIPs were obtained to evaluate  the vascular anatomy. Carotid stenosis measurements (when applicable) are obtained utilizing NASCET criteria, using the distal internal carotid diameter as the denominator. CONTRAST:  90 mL Isovue 370 IV COMPARISON:  None. FINDINGS: CT HEAD FINDINGS Brain: No mass lesion, intraparenchymal hemorrhage or extra-axial collection. No evidence of acute cortical infarct. AREAS of hypoattenuation in the right basal ganglia and left frontal white matter are suggestive of chronic infarcts. Vascular: No hyperdense vessel or unexpected calcification. Skull: Normal visualized skull base, calvarium and extracranial soft tissues. Sinuses/Orbits: Ethmoid and maxillary mild mucosal thickening with atelectatic right maxillary sinus. Normal orbits. CTA NECK FINDINGS Aortic arch:  There is no aneurysm or dissection of the visualized ascending aorta or aortic arch. There is a normal variant aortic arch branching pattern with the brachiocephalic and left common carotid arteries sharing a common origin. The proximal subclavian arteries are patent. There is mild calcification of the aortic arch. Right carotid system: The right common carotid origin is widely patent. There is no common carotid or internal carotid artery dissection or aneurysm. There is mild atherosclerotic calcification at the carotid bifurcation without hemodynamically significant stenosis. Left carotid system: The left common carotid origin is widely patent. There is no common carotid or internal carotid artery dissection or aneurysm. There is mild atherosclerotic calcification at the carotid bifurcation without hemodynamically significant stenosis. Vertebral arteries: The vertebral system is mildly right-dominant. Both vertebral artery origins are normal. Both vertebral arteries are normal to their confluence with the basilar artery. Skeleton: There is no bony spinal canal stenosis. No lytic or blastic lesions. Other neck: The nasopharynx is clear. The oropharynx and hypopharynx are normal. The epiglottis is normal. The supraglottic larynx, glottis and subglottic larynx are normal. No retropharyngeal collection. The parapharyngeal spaces are preserved. The parotid and submandibular glands are normal. No sialolithiasis or salivary ductal dilatation. The thyroid gland is normal. There is no cervical lymphadenopathy. Upper chest: No pneumothorax or pleural effusion. No nodules or masses. Review of the MIP images confirms the above findings CTA HEAD FINDINGS Anterior circulation: --Intracranial internal carotid arteries: There is atherosclerotic calcification of the bilateral cavernous and clinoid segments without advanced stenosis. 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 Otherwise  normal. --Middle cerebral arteries: Normal. --Posterior communicating arteries: Small P-comm is present on the right. Posterior circulation: --Posterior cerebral arteries: Normal. --Superior cerebellar arteries: Normal. --Basilar artery: Normal. --Anterior inferior cerebellar arteries: Normal. --Posterior inferior cerebellar arteries: Normal. Venous sinuses: As permitted by contrast timing, patent. Anatomic variants: Absent right anterior cerebral artery A1 segment, a normal variant. Review of the MIP images confirms the above findings CT Brain Perfusion Findings: CBF (<30%) Volume: 76mL Perfusion (Tmax>6.0s) volume: 34mL Mismatch Volume: 51mL Infarction Location:None IMPRESSION: 1. No perfusion abnormality or other evidence of acute infarct. 2. No intracranial arterial occlusion or high-grade stenosis. 3. Mild carotid bifurcation atherosclerosis without hemodynamically significant stenosis. 4. Mild aortic atherosclerosis. Electronically Signed   By: Ulyses Jarred M.D.   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 Does the need for close, 24 hr/day medical supervision in concert with the patient's rehab needs make it unreasonable for this patient to be served in a less intensive setting? Yes 2. Co-Morbidities requiring supervision/potential complications: htn, abla, oa left knee, gout 3. Due to bladder management, bowel management, safety, skin/wound care, disease management, medication administration, pain management and patient education, does the patient require 24 hr/day rehab nursing? Yes 4. Does the patient require coordinated care  of a physician, rehab nurse, PT (1-2 hrs/day, 5 days/week), OT (1-2 hrs/day, 5 days/week) and SLP (1-2 hrs/day, 5 days/week) to address physical and functional deficits in the context of the above medical diagnosis(es)? Yes Addressing deficits in the following areas: balance, endurance, locomotion, strength,  transferring, bowel/bladder control, bathing, dressing, feeding, grooming, toileting, speech, swallowing and psychosocial support 5. Can the patient actively participate in an intensive therapy program of at least 3 hrs of therapy per day at least 5 days per week? Yes 6. The potential for patient to make measurable gains while on inpatient rehab is excellent 7. Anticipated functional outcomes upon discharge from inpatient rehab are modified independent and supervision  with PT, modified independent, supervision and min assist with OT, modified independent with SLP. 8. Estimated rehab length of stay to reach the above functional goals is: 17-24 days 9. Does the patient have adequate social supports and living environment to accommodate these discharge functional goals? Yes,potentially 10. Anticipated D/C setting: Home 11. Anticipated post D/C treatments: HH therapy and Outpatient therapy 12. Overall Rehab/Functional Prognosis: excellent  RECOMMENDATIONS: This patient's condition is appropriate for continued rehabilitative care in the following setting: CIR Patient has agreed to participate in recommended program. Yes Note that insurance prior authorization may be required for reimbursement for recommended care.  Comment: Rehab Admissions Coordinator to follow up.  Thanks,  Meredith Staggers, MD, Tilford Pillar, PA-C 12/17/2016

## 2016-12-16 NOTE — Evaluation (Signed)
Occupational Therapy Evaluation Patient Details Name: Ellen Henry MRN: [id number] DOB: 30-Mar-1946 Today's Date: 12/16/2016    History of Present Illness Pt is a 71 y/o female with a PMH significant for chronic back pain, HTN. Pt presented for acute onset of stuttering TIA symptoms. She was at home in usual state of health when she suddenly became diffusely weak. She denies that she had lateralized weakness. The diffuse weakness made it difficult for her to get back into bed to rest. Her symptoms resolved but then recurred; she called EMS who noted left facial droop and left facial weakness, which had resolved per ED staff at time of presentation to [facility]. MRI positive for acute CVA.   Clinical Impression   PTA, pt reports independence with ADL and functional mobility. On OT evaluation, pt restless and complaining of severe back pain. She requires max assist with LB ADL and mod assist with UB at this time. She presents with decreased functional use of L UE as well as decreased ability to sustain attention to tasks and follow commands. Unsure if noted cognitive difficulties represent true deficit or are due to pain this session and plan to continue to assess. She required +2 assist for bed mobility this session in preparation for ADL participation at EOB. Pt unsafe sitting at EOB and leaning back and forth from R to L potentially due to pain. Feel pt's limited mobility and ADL participation this session is due to pain and will likely progress to be appropriate for CIR placement for continued rehabilitation services. Will continue to update D/C recommendations based on pt progress. OT will continue to follow acutely.    Follow Up Recommendations  CIR    Equipment Recommendations  Other (comment) (TBD at next venue of care)    Recommendations for Other Services Rehab consult     Precautions / Restrictions Precautions Precautions: Fall Restrictions Weight Bearing Restrictions: No       Mobility Bed Mobility Overal bed mobility: Needs Assistance Bed Mobility: Rolling;Supine to Sit;Sit to Sidelying Rolling: Supervision   Supine to sit: Min assist   Sit to sidelying: Mod assist General bed mobility comments: Assist for trunk elevation to full sitting position, as well as management of LLE off bed/elevation back to bed. Heavy use of rails and LUE inattention.   Transfers                 General transfer comment: Did not attempt further transfer OOB at this time due to pt's inability to sit EOB due to pain, and decreased ability to follow commands at this time (appears to be behavioral more than true deficits.)    Balance Overall balance assessment: Needs assistance Sitting-balance support: Bilateral upper extremity supported;Feet supported Sitting balance-Leahy Scale: Poor   Postural control: Posterior lean;Right lateral lean;Left lateral lean                                  ADL Overall ADL's : Needs assistance/impaired   Eating/Feeding Details (indicate cue type and reason): Pt NPO Grooming: Moderate assistance;Sitting   Upper Body Bathing: Maximal assistance;Sitting   Lower Body Bathing: Maximal assistance;Sitting/lateral leans   Upper Body Dressing : Maximal assistance;Sitting   Lower Body Dressing: Maximal assistance;Sitting/lateral leans                 General ADL Comments: Pt unable to tolerate more than sitting at EOB due to reported back pain. Leaning side  to side frequently potentially due to pain. Decreased awareness of L UE and noted this becoming pinned behind her when completing bed mobility.     Vision Vision Assessment?: No apparent visual deficits Additional Comments: No apparent visual deficits but need to test in functional context when pt in less pain and able to attend to tasks.   Perception     Praxis      Pertinent Vitals/Pain Pain Assessment: Faces Faces Pain Scale: Hurts whole lot Pain Location:  Back Pain Descriptors / Indicators: Restless;Crying;Moaning Pain Intervention(s): Limited activity within patient's tolerance;Monitored during session;Repositioned     Hand Dominance Right   Extremity/Trunk Assessment Upper Extremity Assessment Upper Extremity Assessment: LUE deficits/detail LUE Deficits / Details: Limited AROM and strength grossly. Inattention to L UE when moving to EOB. Brunstom level II hand and arm. Able to initiate slight movement but within flexor synergy pattern. LUE Coordination: decreased fine motor;decreased gross motor   Lower Extremity Assessment Lower Extremity Assessment: Defer to PT evaluation LLE Deficits / Details: Decreased strength and AROM. Was able to demonstrate at least 3/5 strength in hip flexors and quads.        Communication Communication Communication: No difficulties   Cognition Arousal/Alertness: Awake/alert Behavior During Therapy: Restless;Anxious;Impulsive Overall Cognitive Status: No family/caregiver present to determine baseline cognitive functioning                 General Comments: Pt writhing in bed. Alert and oriented but perseverating on pain.   General Comments       Exercises       Shoulder Instructions      Home Living Family/patient expects to be discharged to:: Private residence Living Arrangements: Children;Other relatives Available Help at Discharge: Family;Available PRN/intermittently Type of Home: House Home Access: Stairs to enter [employer] of Steps: 3 Entrance Stairs-Rails: None Home Layout: One level     Bathroom Shower/Tub: Tub/shower unit Shower/tub characteristics: Curtain Biochemist, clinical: Standard Bathroom Accessibility: Yes   Home Equipment: Environmental consultant - 2 wheels;Cane - quad;Bedside commode   Additional Comments: Most details of home environment were taken from a previous admission. Poor historian at time of eval.       Prior Functioning/Environment Level of  Independence: Independent        Comments: Per pt, able to         OT Problem List: Decreased strength;Decreased range of motion;Decreased activity tolerance;Impaired balance (sitting and/or standing);Decreased coordination;Decreased cognition;Decreased safety awareness;Decreased knowledge of use of DME or AE;Decreased knowledge of precautions;Pain;Impaired UE functional use   OT Treatment/Interventions: Self-care/ADL training;Therapeutic exercise;Energy conservation;Therapeutic activities;Cognitive remediation/compensation;Visual/perceptual remediation/compensation;Patient/family education;Balance training;DME and/or AE instruction;Neuromuscular education    OT Goals(Current goals can be found in the care plan section) Acute Rehab OT Goals Patient Stated Goal: Decrease pain OT Goal Formulation: With patient Time For Goal Achievement: 12/23/16 Potential to Achieve Goals: Good ADL Goals Pt Will Perform Grooming: standing;with min assist Pt Will Perform Upper Body Bathing: with min assist;sitting Pt Will Perform Lower Body Bathing: with min assist;sit to/from stand Pt Will Perform Upper Body Dressing: with min assist;sitting Pt Will Perform Lower Body Dressing: with min assist;sit to/from stand Pt Will Transfer to Toilet: with min assist;ambulating;bedside commode (over toilet) Pt/caregiver will Perform Home Exercise Program: Left upper extremity;Increased ROM;Increased strength;With Supervision Additional ADL Goal #1: Pt will complete bed mobility with min guard assist in preparation for ADL tasks.  OT Frequency: Min 3X/week   Barriers to D/C:            Co-evaluation  End of Session    Activity Tolerance: Patient limited by pain;Treatment limited secondary to agitation Patient left: in bed;with call bell/phone within reach   Time: [id number] OT Time Calculation (min): 16 min Charges:  OT General Charges $OT Visit: 1 Procedure OT Evaluation $OT Eval  Moderate Complexity: 1 Procedure  Norman Herrlich, OTR/L [phone] 12/16/2016, 2:20 PM

## 2016-12-16 NOTE — Progress Notes (Signed)
Patient seen and examined  Ellen Henry is an 71 y.o. female  with a history of hypertension, dyslipidemia who presented for acute onset of stuttering TIA symptoms starting at 3 PM today. She was at home in usual state of health when she suddenly became diffusely weak. She denies that she had lateralized weakness. The diffuse weakness made it difficult for her to get back into bed to rest. Her symptoms resolved but then recurred; she called EMS who noted left facial droop and left facial weakness, which had resolved per ED staff at time of presentation to [facility]. MRI positive for acute CVA Stroke workup underway Replete potassium Workup pending  patient also has acute on chronic back pain last imaging study was in 2015 Resume oxycodone and start patient on Robaxin as needed

## 2016-12-16 NOTE — Care Management Note (Signed)
Case Management Note  Patient Details  Name: Ellen Henry MRN: [id number] Date of Birth: Jun 08, 1946  Subjective/Objective:          Patient was admitted with CVA. Prior to arrival, patient was from home and independent with ADLs. CM will follow for discharge needs pending PT/OT evals and physician orders.           Action/Plan:   Expected Discharge Date:                  Expected Discharge Plan:     In-House Referral:     Discharge planning Services     Post Acute Care Choice:    Choice offered to:     DME Arranged:    DME Agency:     HH Arranged:    HH Agency:     Status of Service:     If discussed at [employer] of Stay Meetings, dates discussed:    Additional Comments:  Rolm Baptise, RN 12/16/2016, 12:41 PM

## 2016-12-16 NOTE — Evaluation (Signed)
Clinical/Bedside Swallow Evaluation Patient Details  Name: Ellen Henry MRN: [id number] Date of Birth: 04-17-1946  Today's Date: 12/16/2016 Time: SLP Start Time (ACUTE ONLY): 23 SLP Stop Time (ACUTE ONLY): 1440 SLP Time Calculation (min) (ACUTE ONLY): 35 min  Past Medical History:  Past Medical History:  Diagnosis Date  . Arthritis   . Dry skin   . GERD (gastroesophageal reflux disease)    "years ago", diet controlled  . Gout   . H/O pyelonephritis    as a child  . 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 Hypertension   . Neuropathy ([place])   . Pneumonia   . Radiculopathy   . Sleep apnea    does not use cpap   Past Surgical History:  Past Surgical History:  Procedure Laterality Date  . ABDOMINAL EXPOSURE N/A 08/29/2014   Procedure: ABDOMINAL EXPOSURE;  Surgeon: Rosetta Posner, MD;  Location: [place];  Service: Vascular;  Laterality: N/A;  . ABDOMINAL HYSTERECTOMY    . ANTERIOR LUMBAR FUSION N/A 08/29/2014   Procedure: ANTERIOR LUMBAR FUSION 1 LEVEL;  Surgeon: Sinclair Ship, MD;  Location: [place];  Service: Orthopedics;  Laterality: N/A;  Lumbar 4-5 anterior lumbar interbody fusion with allograft and instrumentation.  . APPENDECTOMY    . BACK SURGERY  08/28/2014 and 08/29/2014   lumbar fusion  . CARPAL TUNNEL RELEASE Right    release done twice  . CARPAL TUNNEL RELEASE Right    x 2  . COLONOSCOPY    . JOINT REPLACEMENT    . JOINT REPLACEMENT     right knee  . REPLACEMENT TOTAL KNEE  2010   rigth knee  . TONSILLECTOMY    . TOTAL KNEE ARTHROPLASTY Left 02/15/2015   Procedure: TOTAL KNEE ARTHROPLASTY;  Surgeon: [name], MD;  Location: [place];  Service: Orthopedics;  Laterality: Left;   HPI:  Pt is a 71 y/o female with a PMH significant for chronic back pain, HTN, GERD, pna presented for acute onset of stuttering and TIA symptoms. MRI acute infarct posterior limb internal capsule on the right. CXR no acute cardiopulmonary process, stable examination.    Assessment / Plan / Recommendation Clinical Impression  Suspect airway compromise marked by consistent throat clearing following cup sips thin liquid prevented by nectar trials during this assessment. Solid texture not given due to dentures not present and pt stated she uses them with all po's. Recommend Dys 2 texture and nectar thick liquids, no straws, pills whole in applesauce. ST will return for continued treatment for liquid and texture upgrade, safety and efficiency.       Aspiration Risk   (mild-mod)    Diet Recommendation Dysphagia 2 (Fine chop);Nectar-thick liquid   Liquid Administration via: Cup;No straw Medication Administration: Whole meds with puree Supervision: Patient able to self feed;Full supervision/cueing for compensatory strategies Compensations: Slow rate;Small sips/bites Postural Changes: Seated upright at 90 degrees    Other  Recommendations Oral Care Recommendations: Oral care BID   Follow up Recommendations  (TBD)      Frequency and Duration min 2x/week  2 weeks       Prognosis Prognosis for Safe Diet Advancement: Good      Swallow Study   General HPI: Pt is a 71 y/o female with a PMH significant for chronic back pain, HTN, GERD, pna presented for acute onset of stuttering and TIA symptoms. MRI acute infarct posterior limb internal capsule on the right. CXR no acute cardiopulmonary process, stable examination.  Type of Study: Bedside Swallow Evaluation Previous Swallow Assessment:  (none found) Diet Prior to this Study: NPO Temperature Spikes Noted: No Respiratory Status: Room air History of Recent Intubation: No Behavior/Cognition: Alert;Cooperative;Requires cueing Oral Cavity Assessment: Within Functional Limits Oral Care Completed by SLP: No Oral Cavity - Dentition: Edentulous (dentures in sister's car) Vision: Functional for self-feeding Self-Feeding Abilities: Able to feed self;Needs set up;Needs assist Patient Positioning: Upright in  bed Baseline Vocal Quality: Normal Volitional Cough: Strong Volitional Swallow: Able to elicit    Oral/Motor/Sensory Function Overall Oral Motor/Sensory Function: Mild impairment Facial ROM: Reduced left;Suspected CN VII (facial) dysfunction Facial Symmetry: Abnormal symmetry left;Suspected CN VII (facial) dysfunction Facial Strength: Reduced left;Suspected CN VII (facial) dysfunction Lingual ROM: Within Functional Limits Lingual Symmetry: Within Functional Limits Lingual Strength: Within Functional Limits Velum: Within Functional Limits   Ice Chips Ice chips: Not tested   Thin Liquid Thin Liquid: Impaired Presentation: Cup Pharyngeal  Phase Impairments: Throat Clearing - Immediate;Cough - Delayed    Nectar Thick Nectar Thick Liquid: Within functional limits Presentation: Cup   Honey Thick Honey Thick Liquid: Not tested   Puree Puree: Within functional limits   Solid   GO   Solid: Not tested (dentures not available)        Houston Siren 12/16/2016,3:29 PM  Orbie Pyo Colvin Caroli.Ed [employer] [phone]

## 2016-12-16 NOTE — Progress Notes (Signed)
Inpatient Rehabilitation  PT and OT have evaluated pt. and are recommending IP Rehab, however pt. was quite limited in what she could tolerate today due to restlessness and report of significant back pain.  Would like to monitor pt's progress in next therapy session before recommending IP rehab consult.  Please call if questions.  [place] Admissions Coordinator Cell [phone] Office [phone]

## 2016-12-16 NOTE — Progress Notes (Signed)
Pt very restless and agitated. Bladder frequency, unsafe to use bedside commode even with staff assist x2 will not follow commands safely. Unable to use bedpan. Tech reported bladder scan 600+. Md paged. Family at bedside.

## 2016-12-17 ENCOUNTER — Inpatient Hospital Stay (HOSPITAL_COMMUNITY)
Admission: RE | Admit: 2016-12-17 | Discharge: 2016-12-31 | DRG: 057 | Disposition: A | Discharge status: Home Health | Payer: Medicare Other | Source: Intra-hospital | Attending: Physical Medicine & Rehabilitation | Admitting: Physical Medicine & Rehabilitation

## 2016-12-17 ENCOUNTER — Inpatient Hospital Stay (HOSPITAL_COMMUNITY): Payer: Medicare Other

## 2016-12-17 ENCOUNTER — Encounter (HOSPITAL_COMMUNITY): Payer: Self-pay | Admitting: Physical Medicine and Rehabilitation

## 2016-12-17 ENCOUNTER — Other Ambulatory Visit (HOSPITAL_COMMUNITY): Payer: Self-pay

## 2016-12-17 DIAGNOSIS — I639 Cerebral infarction, unspecified: Secondary | ICD-10-CM | POA: Diagnosis present

## 2016-12-17 DIAGNOSIS — Z79899 Other long term (current) drug therapy: Secondary | ICD-10-CM | POA: Diagnosis not present

## 2016-12-17 DIAGNOSIS — K5909 Other constipation: Secondary | ICD-10-CM | POA: Diagnosis not present

## 2016-12-17 DIAGNOSIS — Z96653 Presence of artificial knee joint, bilateral: Secondary | ICD-10-CM

## 2016-12-17 DIAGNOSIS — G8929 Other chronic pain: Secondary | ICD-10-CM

## 2016-12-17 DIAGNOSIS — G4733 Obstructive sleep apnea (adult) (pediatric): Secondary | ICD-10-CM | POA: Diagnosis not present

## 2016-12-17 DIAGNOSIS — I6330 Cerebral infarction due to thrombosis of unspecified cerebral artery: Secondary | ICD-10-CM | POA: Diagnosis not present

## 2016-12-17 DIAGNOSIS — I69328 Other speech and language deficits following cerebral infarction: Secondary | ICD-10-CM | POA: Diagnosis not present

## 2016-12-17 DIAGNOSIS — E785 Hyperlipidemia, unspecified: Secondary | ICD-10-CM | POA: Diagnosis not present

## 2016-12-17 DIAGNOSIS — Z7982 Long term (current) use of aspirin: Secondary | ICD-10-CM | POA: Diagnosis not present

## 2016-12-17 DIAGNOSIS — I10 Essential (primary) hypertension: Secondary | ICD-10-CM

## 2016-12-17 DIAGNOSIS — K5901 Slow transit constipation: Secondary | ICD-10-CM

## 2016-12-17 DIAGNOSIS — Z981 Arthrodesis status: Secondary | ICD-10-CM

## 2016-12-17 DIAGNOSIS — E876 Hypokalemia: Secondary | ICD-10-CM

## 2016-12-17 DIAGNOSIS — Z23 Encounter for immunization: Secondary | ICD-10-CM | POA: Diagnosis not present

## 2016-12-17 DIAGNOSIS — I69392 Facial weakness following cerebral infarction: Secondary | ICD-10-CM | POA: Diagnosis not present

## 2016-12-17 DIAGNOSIS — R339 Retention of urine, unspecified: Secondary | ICD-10-CM

## 2016-12-17 DIAGNOSIS — G629 Polyneuropathy, unspecified: Secondary | ICD-10-CM

## 2016-12-17 DIAGNOSIS — M10071 Idiopathic gout, right ankle and foot: Secondary | ICD-10-CM | POA: Diagnosis not present

## 2016-12-17 DIAGNOSIS — F17210 Nicotine dependence, cigarettes, uncomplicated: Secondary | ICD-10-CM | POA: Diagnosis not present

## 2016-12-17 DIAGNOSIS — R52 Pain, unspecified: Secondary | ICD-10-CM

## 2016-12-17 DIAGNOSIS — R0989 Other specified symptoms and signs involving the circulatory and respiratory systems: Secondary | ICD-10-CM | POA: Diagnosis not present

## 2016-12-17 DIAGNOSIS — M25561 Pain in right knee: Secondary | ICD-10-CM

## 2016-12-17 DIAGNOSIS — G8194 Hemiplegia, unspecified affecting left nondominant side: Secondary | ICD-10-CM

## 2016-12-17 DIAGNOSIS — M5410 Radiculopathy, site unspecified: Secondary | ICD-10-CM

## 2016-12-17 DIAGNOSIS — G894 Chronic pain syndrome: Secondary | ICD-10-CM | POA: Diagnosis not present

## 2016-12-17 DIAGNOSIS — M961 Postlaminectomy syndrome, not elsewhere classified: Secondary | ICD-10-CM

## 2016-12-17 DIAGNOSIS — M549 Dorsalgia, unspecified: Secondary | ICD-10-CM | POA: Diagnosis not present

## 2016-12-17 DIAGNOSIS — M545 Low back pain: Secondary | ICD-10-CM | POA: Diagnosis not present

## 2016-12-17 DIAGNOSIS — I69354 Hemiplegia and hemiparesis following cerebral infarction affecting left non-dominant side: Principal | ICD-10-CM

## 2016-12-17 DIAGNOSIS — I69391 Dysphagia following cerebral infarction: Secondary | ICD-10-CM | POA: Diagnosis not present

## 2016-12-17 LAB — URINALYSIS, ROUTINE W REFLEX MICROSCOPIC
Bilirubin Urine: NEGATIVE
Glucose, UA: NEGATIVE mg/dL
Hgb urine dipstick: NEGATIVE
Ketones, ur: NEGATIVE mg/dL
Leukocytes, UA: NEGATIVE
Nitrite: NEGATIVE
Protein, ur: NEGATIVE mg/dL
Specific Gravity, Urine: 1.01 (ref 1.005–1.030)
pH: 8 (ref 5.0–8.0)

## 2016-12-17 LAB — ECHOCARDIOGRAM COMPLETE
Height: 68 in
Weight: 3680 oz

## 2016-12-17 LAB — COMPREHENSIVE METABOLIC PANEL
ALT: 15 U/L (ref 14–54)
AST: 21 U/L (ref 15–41)
Albumin: 3.3 g/dL — ABNORMAL LOW (ref 3.5–5.0)
Alkaline Phosphatase: 110 U/L (ref 38–126)
Anion gap: 9 (ref 5–15)
BUN: 6 mg/dL (ref 6–20)
CO2: 26 mmol/L (ref 22–32)
Calcium: 9.3 mg/dL (ref 8.9–10.3)
Chloride: 108 mmol/L (ref 101–111)
Creatinine, Ser: 0.65 mg/dL (ref 0.44–1.00)
GFR calc Af Amer: >60 mL/min (ref >60)
GFR calc non Af Amer: >60 mL/min (ref >60)
Glucose, Bld: 106 mg/dL — ABNORMAL HIGH (ref 65–99)
Potassium: 3.3 mmol/L — ABNORMAL LOW (ref 3.5–5.1)
Sodium: 143 mmol/L (ref 135–145)
Total Bilirubin: 0.8 mg/dL (ref 0.3–1.2)
Total Protein: 7.1 g/dL (ref 6.5–8.1)

## 2016-12-17 LAB — CBC
HCT: 38.8 % (ref 36.0–46.0)
Hemoglobin: 13 g/dL (ref 12.0–15.0)
MCH: 27.7 pg (ref 26.0–34.0)
MCHC: 33.5 g/dL (ref 30.0–36.0)
MCV: 82.7 fL (ref 78.0–100.0)
Platelets: 253 10*3/uL (ref 150–400)
RBC: 4.69 MIL/uL (ref 3.87–5.11)
RDW: 14.6 % (ref 11.5–15.5)
WBC: 9 10*3/uL (ref 4.0–10.5)

## 2016-12-17 LAB — HEMOGLOBIN A1C
Hgb A1c MFr Bld: 5.4 % (ref 4.8–5.6)
Mean Plasma Glucose: 108 mg/dL

## 2016-12-17 MED ORDER — OXYCODONE HCL 5 MG PO TABS: 5.0000 mg | ORAL_TABLET | Freq: Three times a day (TID) | ORAL | 0 refills | Status: DC | PRN

## 2016-12-17 MED ORDER — ALUM & MAG HYDROXIDE-SIMETH 200-200-20 MG/5ML PO SUSP
30.0000 mL | Freq: Four times a day (QID) | ORAL | Status: DC | PRN
  Administered 2016-12-17: 30 mL via ORAL
  Filled 2016-12-17: qty 30

## 2016-12-17 MED ORDER — PROCHLORPERAZINE EDISYLATE 5 MG/ML IJ SOLN: 5.0000 mg | Freq: Four times a day (QID) | INTRAMUSCULAR | Status: DC | PRN

## 2016-12-17 MED ORDER — STARCH (THICKENING) PO POWD: 1.0000 | ORAL | 0 refills | Status: DC | PRN

## 2016-12-17 MED ORDER — ENOXAPARIN SODIUM 40 MG/0.4ML ~~LOC~~ SOLN
40.0000 mg | SUBCUTANEOUS | Status: DC
  Administered 2016-12-18 – 2016-12-31 (×14): 40 mg via SUBCUTANEOUS
  Filled 2016-12-17 (×14): qty 0.4

## 2016-12-17 MED ORDER — DIPHENHYDRAMINE HCL 12.5 MG/5ML PO ELIX: 12.5000 mg | ORAL_SOLUTION | Freq: Four times a day (QID) | ORAL | Status: DC | PRN

## 2016-12-17 MED ORDER — ACETAMINOPHEN 325 MG PO TABS
325.0000 mg | ORAL_TABLET | ORAL | Status: DC | PRN
  Administered 2016-12-17 – 2016-12-31 (×30): 650 mg via ORAL
  Filled 2016-12-17 (×31): qty 2

## 2016-12-17 MED ORDER — POTASSIUM CHLORIDE 20 MEQ PO PACK
40.0000 meq | PACK | Freq: Once | ORAL | Status: AC
  Administered 2016-12-17: 40 meq via ORAL
  Filled 2016-12-17: qty 2

## 2016-12-17 MED ORDER — TRAZODONE HCL 50 MG PO TABS
25.0000 mg | ORAL_TABLET | Freq: Every evening | ORAL | Status: DC | PRN
  Administered 2016-12-17 – 2016-12-29 (×13): 50 mg via ORAL
  Filled 2016-12-17 (×13): qty 1

## 2016-12-17 MED ORDER — ATORVASTATIN CALCIUM 40 MG PO TABS
40.0000 mg | ORAL_TABLET | Freq: Every day | ORAL | 2 refills | Status: DC
Start: 2016-12-17 — End: 2016-12-31

## 2016-12-17 MED ORDER — POTASSIUM CHLORIDE 20 MEQ PO PACK
40.0000 meq | PACK | Freq: Two times a day (BID) | ORAL | Status: DC
  Administered 2016-12-17: 40 meq via ORAL
  Filled 2016-12-17 (×2): qty 2

## 2016-12-17 MED ORDER — POTASSIUM CHLORIDE 20 MEQ PO PACK: 40.0000 meq | PACK | Freq: Every day | ORAL | 1 refills | Status: DC

## 2016-12-17 MED ORDER — NICOTINE 14 MG/24HR TD PT24
14.0000 mg | MEDICATED_PATCH | Freq: Every day | TRANSDERMAL | Status: DC
  Administered 2016-12-18 – 2016-12-31 (×14): 14 mg via TRANSDERMAL
  Filled 2016-12-17 (×14): qty 1

## 2016-12-17 MED ORDER — PROCHLORPERAZINE 25 MG RE SUPP: 12.5000 mg | Freq: Four times a day (QID) | RECTAL | Status: DC | PRN

## 2016-12-17 MED ORDER — PROCHLORPERAZINE MALEATE 5 MG PO TABS
5.0000 mg | ORAL_TABLET | Freq: Four times a day (QID) | ORAL | Status: DC | PRN
  Administered 2016-12-28: 10 mg via ORAL
  Filled 2016-12-17: qty 2

## 2016-12-17 MED ORDER — ASPIRIN 325 MG PO TBEC: 325.0000 mg | DELAYED_RELEASE_TABLET | Freq: Every day | ORAL | 0 refills | Status: DC

## 2016-12-17 MED ORDER — GABAPENTIN 100 MG PO CAPS
100.0000 mg | ORAL_CAPSULE | Freq: Three times a day (TID) | ORAL | Status: DC
  Administered 2016-12-17 – 2016-12-19 (×7): 100 mg via ORAL
  Filled 2016-12-17 (×7): qty 1

## 2016-12-17 MED ORDER — NICOTINE 7 MG/24HR TD PT24: 7.0000 mg | MEDICATED_PATCH | Freq: Every day | TRANSDERMAL | 0 refills | Status: DC

## 2016-12-17 MED ORDER — BISACODYL 10 MG RE SUPP
10.0000 mg | Freq: Every day | RECTAL | Status: DC | PRN
  Administered 2016-12-20 – 2016-12-28 (×4): 10 mg via RECTAL
  Filled 2016-12-17 (×3): qty 1

## 2016-12-17 MED ORDER — GUAIFENESIN-DM 100-10 MG/5ML PO SYRP
5.0000 mL | ORAL_SOLUTION | Freq: Four times a day (QID) | ORAL | Status: DC | PRN
Start: 2016-12-17 — End: 2016-12-31
  Administered 2016-12-19: 10 mL via ORAL
  Filled 2016-12-17: qty 10

## 2016-12-17 MED ORDER — SENNOSIDES-DOCUSATE SODIUM 8.6-50 MG PO TABS
2.0000 | ORAL_TABLET | Freq: Every day | ORAL | Status: DC
  Administered 2016-12-18 – 2016-12-27 (×9): 2 via ORAL
  Filled 2016-12-17 (×11): qty 2

## 2016-12-17 MED ORDER — ATORVASTATIN CALCIUM 40 MG PO TABS
40.0000 mg | ORAL_TABLET | Freq: Every day | ORAL | Status: DC
  Administered 2016-12-17 – 2016-12-30 (×14): 40 mg via ORAL
  Filled 2016-12-17 (×14): qty 1

## 2016-12-17 MED ORDER — CYCLOBENZAPRINE HCL 5 MG PO TABS
5.0000 mg | ORAL_TABLET | Freq: Three times a day (TID) | ORAL | Status: DC | PRN
  Administered 2016-12-18: 5 mg via ORAL
  Filled 2016-12-17: qty 1

## 2016-12-17 MED ORDER — ALUM & MAG HYDROXIDE-SIMETH 200-200-20 MG/5ML PO SUSP: 30.0000 mL | Freq: Four times a day (QID) | ORAL | Status: DC | PRN

## 2016-12-17 MED ORDER — ASPIRIN EC 325 MG PO TBEC
325.0000 mg | DELAYED_RELEASE_TABLET | Freq: Every day | ORAL | Status: DC
  Administered 2016-12-18 – 2016-12-31 (×14): 325 mg via ORAL
  Filled 2016-12-17 (×14): qty 1

## 2016-12-17 MED ORDER — CLONIDINE HCL 0.1 MG PO TABS: 0.1000 mg | ORAL_TABLET | Freq: Four times a day (QID) | ORAL | Status: DC | PRN

## 2016-12-17 MED ORDER — ALUM & MAG HYDROXIDE-SIMETH 200-200-20 MG/5ML PO SUSP
30.0000 mL | ORAL | Status: DC | PRN
  Administered 2016-12-20 – 2016-12-28 (×2): 30 mL via ORAL
  Filled 2016-12-17 (×2): qty 30

## 2016-12-17 MED ORDER — STARCH (THICKENING) PO POWD
ORAL | Status: DC | PRN
  Filled 2016-12-17: qty 227

## 2016-12-17 MED ORDER — METHOCARBAMOL 500 MG PO TABS: 500.0000 mg | ORAL_TABLET | Freq: Three times a day (TID) | ORAL | 0 refills | Status: DC | PRN

## 2016-12-17 MED ORDER — SENNOSIDES-DOCUSATE SODIUM 8.6-50 MG PO TABS: 1.0000 | ORAL_TABLET | Freq: Every evening | ORAL | 1 refills | Status: DC | PRN

## 2016-12-17 MED ORDER — POLYETHYLENE GLYCOL 3350 17 G PO PACK
17.0000 g | PACK | Freq: Every day | ORAL | Status: DC | PRN
  Filled 2016-12-17: qty 1

## 2016-12-17 MED ORDER — FLEET ENEMA 7-19 GM/118ML RE ENEM
1.0000 | ENEMA | Freq: Once | RECTAL | Status: AC | PRN
  Administered 2016-12-17: 1 via RECTAL
  Filled 2016-12-17: qty 1

## 2016-12-17 NOTE — Progress Notes (Signed)
Physical Therapy Treatment Patient Details Name: Ellen Henry MRN: [id number] DOB: Apr 11, 1946 Today's Date: 12/17/2016    History of Present Illness Pt is a 71 y/o female with a PMH significant for chronic back pain, HTN. Pt presented for acute onset of stuttering TIA symptoms. She was at home in usual state of health when she suddenly became diffusely weak. She denies that she had lateralized weakness. The diffuse weakness made it difficult for her to get back into bed to rest. Her symptoms resolved but then recurred; she called EMS who noted left facial droop and left facial weakness, which had resolved per ED staff at time of presentation to [facility]. MRI positive for acute CVA.    PT Comments    Pt able to tolerate standing with Stedy well today with no c/o of increased pain during treatment. Plan to stand with AD versus [facility]HA. Pt continues to a great candidate for CIR to address strength deficits and functional limitations.    Follow Up Recommendations  CIR     Equipment Recommendations  None recommended by PT    Recommendations for Other Services Rehab consult     Precautions / Restrictions Precautions Precautions: Fall Precaution Comments: L inattention to arm; L sided weakness Restrictions Weight Bearing Restrictions: No    Mobility  Bed Mobility Overal bed mobility: Needs Assistance Bed Mobility: Supine to Sit;Sidelying to Sit     Supine to sit: Min assist;+2 for physical assistance     General bed mobility comments: Min assist for trunk elevation and safety  with heavy dependence of railing using R UE. L UE inattention and min assist with L LE during transfer.   Transfers Overall transfer level: Needs assistance Equipment used:  Charlaine Dalton) Transfers: Sit to/from Stand Sit to Stand: Mod assist;+2 physical assistance         General transfer comment: Pt requiring v/c for hip ext during sit to stand and postural awareness. Mod assist for L UE due to weakness and  lack of coordination to keep hand on stedy. Knee instability observed in stedy frame; pt will likely need L knee block during progression to standing with walker.   Ambulation/Gait                 Stairs            Wheelchair Mobility    Modified Rankin (Stroke Patients Only)       Balance Overall balance assessment: Needs assistance Sitting-balance support: Feet supported Sitting balance-Leahy Scale: Poor Sitting balance - Comments: required mod v/c for upright posture during upper body dressing.  Postural control: Posterior lean;Right lateral lean;Left lateral lean Standing balance support: Bilateral upper extremity supported Standing balance-Leahy Scale: Poor Standing balance comment: required stedy for standing to block L knee and holding to rail for B UE support.                     Cognition Arousal/Alertness: Awake/alert Behavior During Therapy: Anxious;Impulsive Overall Cognitive Status: Within Functional Limits for tasks assessed                 General Comments: Pt impulsive to get out of bed once told about treatment of standing today. Required cues for sitting still.     Exercises General Exercises - Lower Extremity Long Arc Quad: Both;10 reps;AROM;Seated Hip Flexion/Marching: AROM;Both;10 reps;Seated Heel Raises: Both;10 reps;Seated;AROM    General Comments        Pertinent Vitals/Pain Pain Assessment: Faces Faces Pain Scale: Hurts little  more Pain Location: lumbar region Pain Descriptors / Indicators: Discomfort;Constant Pain Intervention(s): Repositioned;Monitored during session    Home Living     Available Help at Discharge: Family;Available PRN/intermittently Type of Home: House              Prior Function            PT Goals (current goals can now be found in the care plan section) Acute Rehab PT Goals Patient Stated Goal: patient's daughter concerned about UE functional use.  PT Goal Formulation: With  patient/family Potential to Achieve Goals: Good Progress towards PT goals: Progressing toward goals    Frequency    Min 4X/week      PT Plan Current plan remains appropriate    Co-evaluation             End of Session Equipment Utilized During Treatment: Gait belt Activity Tolerance: Patient limited by fatigue;No increased pain;Patient tolerated treatment well (patient reports no change in pain during session. ) Patient left: in chair;with call bell/phone within reach;with chair alarm set;with family/visitor present;with nursing/sitter in room     Time: 1010-1037 PT Time Calculation (min) (ACUTE ONLY): 27 min  Charges:  $Therapeutic Exercise: 8-22 mins $Therapeutic Activity: 8-22 mins                    G Codes:      [facility] 12-28-16, 10:57 AM Olena Leatherwood, SPTA Pager [phone]

## 2016-12-17 NOTE — Progress Notes (Signed)
   12/17/16 1139  Modified Rankin (Stroke Patients Only)  Pre-Morbid Rankin Score 1  Modified Rankin 5  Governor [place], [place] pager [phone]

## 2016-12-17 NOTE — NC FL2 (Signed)
[place] LEVEL OF CARE SCREENING TOOL     IDENTIFICATION  Patient Name: Ellen Henry Birthdate: [birth year] Sex: female Admission Date (Current Location): 12/15/2016  [facility] and [place] Number:  Herbalist and Address:  [facility] [place], [place] [address], [place], [place] [address]      Provider Number: [id number]  Attending Physician Name and Address:  Reyne Dumas, MD  Relative Name and Phone Number:       Current Level of Care: Hospital Recommended Level of Care: [place] Prior Approval Number:    Date Approved/Denied:   PASRR Number:    Discharge Plan: SNF    Current Diagnoses: Patient Active Problem List   Diagnosis Date Noted  . Generalized weakness   . Acute ischemic stroke ([place]) 12/15/2016  . Urinary retention 02/25/2015  . Primary osteoarthritis of left knee 02/15/2015  . Morbid obesity ([place]) 02/15/2015  . Essential hypertension, benign 09/07/2014  . Constipation 09/07/2014  . Hyperlipidemia 09/05/2014  . Gout 09/05/2014  . Acute blood loss anemia 09/05/2014  . Neuropathy ([place]) 09/05/2014  . UTI (urinary tract infection) 08/31/2014  . Radiculopathy 08/29/2014    Orientation RESPIRATION BLADDER Height & Weight     Self, Time, Situation, Place  Normal Incontinent Weight: 230 lb (104.3 kg) Height:  5\' 8"  (172.7 cm)  BEHAVIORAL SYMPTOMS/MOOD NEUROLOGICAL BOWEL NUTRITION STATUS   (None)  (Stroke) Continent Diet (DYS 2. Pills whole in applesauce.)  AMBULATORY STATUS COMMUNICATION OF NEEDS Skin   Extensive Assist Verbally Normal                       Personal Care Assistance Level of Assistance  Bathing, Feeding, Dressing Bathing Assistance: Maximum assistance Feeding assistance:  (NPO at time of evaluation.) Dressing Assistance: Maximum assistance     Functional Limitations Info  Sight, Hearing, Speech Sight Info: Adequate Hearing Info: Adequate Speech Info: Impaired  (Slurred/Dysarthria)    SPECIAL CARE FACTORS FREQUENCY  PT (By licensed PT), OT (By licensed OT), Blood pressure, Speech therapy     PT Frequency: 5 x week OT Frequency: 5 x week     Speech Therapy Frequency: 5 x week      Contractures Contractures Info: Not present    Additional Factors Info  Code Status, Allergies Code Status Info: Full Allergies Info: Food Allergy Formula           Current Medications (12/17/2016):  This is the current hospital active medication list Current Facility-Administered Medications  Medication Dose Route Frequency Provider Last Rate Last Dose  . 0.9 % NaCl with KCl 20 mEq/ L  infusion   Intravenous Continuous Reyne Dumas, MD 75 mL/hr at 12/17/16 0545    . acetaminophen (TYLENOL) tablet 650 mg  650 mg Oral Q4H PRN Etta Quill, DO       Or  . acetaminophen (TYLENOL) solution 650 mg  650 mg Per Tube Q4H PRN Etta Quill, DO       Or  . acetaminophen (TYLENOL) suppository 650 mg  650 mg Rectal Q4H PRN Etta Quill, DO   650 mg at 12/16/16 0855  . aspirin EC tablet 325 mg  325 mg Oral Daily Etta Quill, DO   325 mg at 12/17/16 1027  . atorvastatin (LIPITOR) tablet 40 mg  40 mg Oral q1800 Etta Quill, DO   40 mg at 12/16/16 1753  . chlorhexidine (PERIDEX) 0.12 % solution 15 mL  15 mL  Mouth Rinse BID Reyne Dumas, MD   15 mL at 12/17/16 1024  . enoxaparin (LOVENOX) injection 40 mg  40 mg Subcutaneous Q24H Etta Quill, DO   40 mg at 12/17/16 1022  . food thickener (THICK IT) powder   Oral PRN Reyne Dumas, MD      . hydrALAZINE (APRESOLINE) injection 5 mg  5 mg Intravenous Q4H PRN Reyne Dumas, MD      . MEDLINE mouth rinse  15 mL Mouth Rinse q12n4p Reyne Dumas, MD   15 mL at 12/16/16 1600  . methocarbamol (ROBAXIN) tablet 500 mg  500 mg Oral Q8H PRN Reyne Dumas, MD   500 mg at 12/17/16 0802  . nicotine (NICODERM CQ - dosed in mg/24 hr) patch 7 mg  7 mg Transdermal Daily Etta Quill, DO   7 mg at 12/17/16 1028  . oxyCODONE  (Oxy IR/ROXICODONE) immediate release tablet 5 mg  5 mg Oral Q8H PRN Reyne Dumas, MD   5 mg at 12/17/16 0802  . potassium chloride (KLOR-CON) packet 40 mEq  40 mEq Oral BID Reyne Dumas, MD   40 mEq at 12/17/16 1022  . senna-docusate (Senokot-S) tablet 1 tablet  1 tablet Oral QHS PRN Etta Quill, DO         Discharge Medications: Please see discharge summary for a list of discharge medications.  Relevant Imaging Results:  Relevant Lab Results:   Additional Information SS#: [id number]  Candie Chroman, LCSW

## 2016-12-17 NOTE — Progress Notes (Signed)
Retta Diones, RN Rehab Admission Coordinator Signed Physical Medicine and Rehabilitation  PMR Pre-admission Date of Service: 12/17/2016 12:54 PM  Related encounter: ED to Hosp-Admission (Current) from 12/15/2016 in [place]       '[]' Hide copied text PMR Admission Coordinator Pre-Admission Assessment  Patient: Ellen Henry is an 71 y.o., female MRN: [id number] DOB: [birth year] Height: '5\' 8"'  (172.7 cm) Weight: 104.3 kg (230 lb)                                                                                                                                                  Insurance Information HMO: Yes   PPO:       PCP:       IPA:       80/20:       OTHER:  Group # [id number] PRIMARY:  UHC medicare      Policy#: [id number]      Subscriber:  [name] CM Name: Sherlynn Stalls      Phone#: [phone]     Fax#:  [phone] (has EPIC access) Pre-Cert#:  [id number]      Employer:  Retired Benefits:  Phone #: [phone]     Name:  Hanley Seamen. Date: 12/07/16     Deduct:  $0      Out of Pocket Max: [phone] (met $0)      Life Max:  unlimited CIR: $430 days 1-4      SNF:  $0 days 1-20; $160 days 21-62; $0 days 63-100 Outpatient:  Medical necessity     Co-Pay: $40/visit Home Health: 100%      Co-Pay: none DME: 80%     Co-Pay: 20% Providers: in network  Medicaid Application Date:        Case Manager:   Disability Application Date:        Case Worker:    Emergency Tax adviser Information    Name Relation Home Work Mobile   Ross,Diea Daughter [phone]  [phone]   Rob Bunting Daughter   [phone]   Allene Pyo Mother [phone]     Smith,Vertella Daughter   [phone]     Current Medical History  Patient Admitting Diagnosis:  R IC infarct  History of Present Illness:A 71 y.o.femalewith history of HTN, chronic back pain with radiculopathy, OSA who was admitted on 12/14/16 with  complaints of diffuse weakness. She was found to have left facial weakness on evaluation by EMS and symptoms resolved in ED therefore tPA not administered. CT head negative. She developed left sided weakness later that evening and CTA head/neck without perfusion abnormality or occlusion/high grade stenosis. Therefore not endovascular candidate. 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 She was started on ASA for secondary stroke prevention and Work up initiated. Therapy evaluations  done and limited by pain as well as decreased balance at EOB. BSS with signs of aspiration and patient placed on dysphagia 2, nectar liquids. She has had difficulty voiding with PVR .700cc this am. CIR recommended for follow up therapy.   Total: 7=NIH  Past Medical History      Past Medical History:  Diagnosis Date  . Arthritis   . Dry skin   . GERD (gastroesophageal reflux disease)    "years ago", diet controlled  . Gout   . H/O pyelonephritis    as a child  . 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 Hypertension   . Neuropathy ([place])   . Pneumonia   . Radiculopathy   . Sleep apnea    does not use cpap    Family History  family history includes Varicose Veins in her mother.  Prior Rehab/Hospitalizations: Was at [facility] after back surgery in the past.  Had rehab after knee surgery 2015.  Has the patient had major surgery during 100 days prior to admission? No  Current Medications   Current Facility-Administered Medications:  .  0.9 % NaCl with KCl 20 mEq/ L  infusion, , Intravenous, Continuous, Reyne Dumas, MD, Last Rate: 75 mL/hr at 12/17/16 0545 .  acetaminophen (TYLENOL) tablet 650 mg, 650 mg, Oral, Q4H PRN **OR** acetaminophen (TYLENOL) solution 650 mg, 650 mg, Per Tube, Q4H PRN **OR** acetaminophen (TYLENOL) suppository 650 mg, 650 mg, Rectal, Q4H PRN, Etta Quill, DO, 650 mg at 12/16/16 0855 .  alum & mag  hydroxide-simeth (MAALOX/MYLANTA) 200-200-20 MG/5ML suspension 30 mL, 30 mL, Oral, Q6H PRN, Reyne Dumas, MD, 30 mL at 12/17/16 1446 .  aspirin EC tablet 325 mg, 325 mg, Oral, Daily, Etta Quill, DO, 325 mg at 12/17/16 1027 .  atorvastatin (LIPITOR) tablet 40 mg, 40 mg, Oral, q1800, Etta Quill, DO, 40 mg at 12/16/16 1753 .  chlorhexidine (PERIDEX) 0.12 % solution 15 mL, 15 mL, Mouth Rinse, BID, Reyne Dumas, MD, 15 mL at 12/17/16 1024 .  enoxaparin (LOVENOX) injection 40 mg, 40 mg, Subcutaneous, Q24H, Jared M Gardner, DO, 40 mg at 12/17/16 1022 .  food thickener (THICK IT) powder, , Oral, PRN, Reyne Dumas, MD .  hydrALAZINE (APRESOLINE) injection 5 mg, 5 mg, Intravenous, Q4H PRN, Reyne Dumas, MD .  MEDLINE mouth rinse, 15 mL, Mouth Rinse, q12n4p, Reyne Dumas, MD, 15 mL at 12/16/16 1600 .  methocarbamol (ROBAXIN) tablet 500 mg, 500 mg, Oral, Q8H PRN, Reyne Dumas, MD, 500 mg at 12/17/16 0802 .  nicotine (NICODERM CQ - dosed in mg/24 hr) patch 7 mg, 7 mg, Transdermal, Daily, Etta Quill, DO, 7 mg at 12/17/16 1028 .  oxyCODONE (Oxy IR/ROXICODONE) immediate release tablet 5 mg, 5 mg, Oral, Q8H PRN, Reyne Dumas, MD, 5 mg at 12/17/16 0802 .  potassium chloride (KLOR-CON) packet 40 mEq, 40 mEq, Oral, BID, Reyne Dumas, MD, 40 mEq at 12/17/16 1022 .  senna-docusate (Senokot-S) tablet 1 tablet, 1 tablet, Oral, QHS PRN, Etta Quill, DO, 1 tablet at 12/17/16 1447  Patients Current Diet: DIET DYS 2 Room service appropriate? Yes; Fluid consistency: Thin Diet - low sodium heart healthy  Precautions / Restrictions Precautions Precautions: Fall Precaution Comments: L inattention to arm; L sided weakness Restrictions Weight Bearing Restrictions: No   Has the patient had 2 or more falls or a fall with injury in the past year?Yes.  Reports 4-5 falls with no injury.  Prior Activity Level Community (5-7x/wk):  Went out daily.  Was driving.  Home Assistive Devices / Equipment Home  Assistive Devices/Equipment: Eyeglasses, Dentures (specify type) Home Equipment: Walker - 2 wheels, Cane - quad, Bedside commode  Prior Device Use: Indicate devices/aids used by the patient prior to current illness, exacerbation or injury? Walker and [employer].  Has not used a walker in a year.  Kasandra Knudsen is in her closet and used at times.  Prior Functional Level Prior Function Level of Independence: Independent Comments: Per pt, able to   Self Care: Did the patient need help bathing, dressing, using the toilet or eating?  Walker and [employer] Mobility: Did the patient need assistance with walking from room to room (with or without device)? Independent  Stairs: Did the patient need assistance with internal or external stairs (with or without device)? Independent  Functional Cognition: Did the patient need help planning regular tasks such as shopping or remembering to take medications? Independent  Current Functional Level Cognition Arousal/Alertness: Awake/alert Overall Cognitive Status: Within Functional Limits for tasks assessed Orientation Level: Oriented X4 General Comments: Pt impulsive to get out of bed once told about treatment of standing today. Required cues for sitting still.  Attention: Sustained Sustained Attention: Appears intact Memory: Appears intact Awareness: Appears intact Problem Solving: Appears intact Safety/Judgment: Appears intact    Extremity Assessment (includes Sensation/Coordination) Upper Extremity Assessment: LUE deficits/detail LUE Deficits / Details: Limited AROM and strength grossly. Inattention to L UE when moving to EOB. Brunstom level II hand and arm. Able to initiate slight movement but within flexor synergy pattern. LUE Coordination: decreased fine motor, decreased gross motor  Lower Extremity Assessment: Defer to PT evaluation LLE Deficits / Details: Decreased strength and AROM. Was able to demonstrate at least 3/5 strength in hip flexors and  quads.    ADLs Overall ADL's : Needs assistance/impaired Eating/Feeding Details (indicate cue type and reason): Pt NPO Grooming: Moderate assistance, Sitting Upper Body Bathing: Maximal assistance, Sitting Lower Body Bathing: Maximal assistance, Sitting/lateral leans Upper Body Dressing : Maximal assistance, Sitting Lower Body Dressing: Maximal assistance, Sitting/lateral leans General ADL Comments: Pt unable to tolerate more than sitting at EOB due to reported back pain. Leaning side to side frequently potentially due to pain. Decreased awareness of L UE and noted this becoming pinned behind her when completing bed mobility.   Mobility Overal bed mobility: Needs Assistance Bed Mobility: Supine to Sit, Sidelying to Sit Rolling: Supervision Supine to sit: Min assist, +2 for physical assistance Sit to sidelying: Mod assist General bed mobility comments: Min assist for trunk elevation and safety  with heavy dependence of railing using R UE. L UE inattention and min assist with L LE during transfer.    Transfers Overall transfer level: Needs assistance Equipment used:  Charlaine Dalton) Transfer via Lift Equipment: Stedy Transfers: Sit to/from Stand Sit to Stand: Mod assist, +2 physical assistance General transfer comment: Pt requiring v/c for hip ext during sit to stand and postural awareness. Mod assist for L UE due to weakness and lack of coordination to keep hand on stedy. Knee instability observed in stedy frame; pt will likely need L knee block during progression to standing with walker.    Ambulation / Gait / Stairs / Scientist, clinical (histocompatibility and immunogenetics) / Balance Dynamic Sitting Balance Sitting balance - Comments: required mod v/c for upright posture during upper body dressing.  Balance Overall balance assessment: Needs assistance Sitting-balance support: Feet supported Sitting balance-Leahy Scale: Poor Sitting balance - Comments: required mod v/c for upright  posture during upper body dressing.    Postural control: Posterior lean, Right lateral lean, Left lateral lean Standing balance support: Bilateral upper extremity supported Standing balance-Leahy Scale: Poor Standing balance comment: required stedy for standing to block L knee and holding to rail for B UE support.    Special needs/care consideration BiPAP/CPAP No CPM No Continuous Drip IV 0.9% NS with 20 meq KCl at 75 mL/hr Dialysis No       Life Vest No Oxygen No Special Bed No Trach Size No Wound Vac (area) No     Skin No                              Bowel mgmt: Last BM 12/15/16 Bladder mgmt: Urgency, incontinence Diabetic mgmt No   Previous Home Environment Living Arrangements: Children, Other relatives  Lives With: Daughter (grandkids) Available Help at Discharge: Family, Available PRN/intermittently Type of Home: House Home Layout: One level Home Access: Stairs to enter Entrance Stairs-Rails: None Entrance Stairs-Number of Steps: 3 Bathroom Shower/Tub: Optometrist: Yes Home Care Services: No Additional Comments: Most details of home environment were taken from a previous admission. Poor historian at time of eval.   Discharge Living Setting Plans for Discharge Living Setting: House, Lives with (comment) (Lives with daughter, 2 grandsons, 1 granddaughter.) Type of Home at Discharge: House Discharge Home Layout: One level Discharge Home Access: Stairs to enter Entrance Stairs-Number of Steps: 3 steps Does the patient have any problems obtaining your medications?: Yes (Describe) (financial; retired)  Radiographer, therapeutic Patient Roles: Parent, Other (Comment) (Has 3 daughters, grandchildren.) Contact Information: Diea Harrington Challenger - daughter Marland Kitchen [phone] Anticipated Caregiver: Daughters and grandchildren. Anticipated Caregiver's Contact Information: Eduard Roux - [phone] Ability/Limitations of Caregiver: Daughters are returning to work, but  they can arrange caregiver for patient while they work. Caregiver Availability: 24/7 Discharge Plan Discussed with Primary Caregiver: Yes Is Caregiver In Agreement with Plan?: Yes Does Caregiver/Family have Issues with Lodging/Transportation while Pt is in Rehab?: No  Goals/Additional Needs Patient/Family Goal for Rehab: PT mod I to supervision, OT supervision to min assist, SLP mod I goals Expected length of stay: 17-24 days Cultural Considerations: None Equipment Needs: TBD Pt/Family Agrees to Admission and willing to participate: Yes Program Orientation Provided & Reviewed with Pt/Caregiver Including Roles  & Responsibilities: Yes  Decrease burden of Care through IP rehab admission:  N/A  Possible need for SNF placement upon discharge: Not planned  Patient Condition: This patient's condition remains as documented in the consult dated 12/17/16, in which the Rehabilitation Physician determined and documented that the patient's condition is appropriate for intensive rehabilitative care in an inpatient rehabilitation facility. Will admit to inpatient rehab today.   Preadmission Screen Completed By:  Retta Diones, 12/17/2016 3:07 PM ______________________________________________________________________   Discussed status with Dr. Letta Pate on 12/17/16 at 1507 and received telephone approval for admission today.  Admission Coordinator:  Retta Diones, time1507/Date01/11/18       [place]

## 2016-12-17 NOTE — Care Management Note (Signed)
Case Management Note  Patient Details  Name: Ellen Henry MRN: [id number] Date of Birth: Oct 18, 1946  Subjective/Objective:                    Action/Plan: Pt discharging to CIR today. No further needs per CM.   Expected Discharge Date:                  Expected Discharge Plan:  [place]  In-House Referral:     Discharge planning Services     Post Acute Care Choice:    Choice offered to:     DME Arranged:    DME Agency:     HH Arranged:    [place] Agency:     Status of Service:  Completed, signed off  If discussed at [employer] of Stay Meetings, dates discussed:    Additional Comments:  Pollie Friar, RN 12/17/2016, 4:22 PM

## 2016-12-17 NOTE — Clinical Social Work Placement (Signed)
   CLINICAL SOCIAL WORK PLACEMENT  NOTE  Date:  12/17/2016  Patient Details  Name: Ellen Henry MRN: [id number] Date of Birth: 12-10-1945  Clinical Social Work is seeking post-discharge placement for this patient at the [place] level of care (*CSW will initial, date and re-position this form in  chart as items are completed):  Yes   Patient/family provided with [facility] Work Department's list of facilities offering this level of care within the geographic area requested by the patient (or if unable, by the patient's family).  Yes   Patient/family informed of their freedom to choose among providers that offer the needed level of care, that participate in Medicare, Medicaid or managed care program needed by the patient, have an available bed and are willing to accept the patient.  Yes   Patient/family informed of [facility]'s ownership interest in [facility] and [facility], as well as of the fact that they are under no obligation to receive care at these facilities.  PASRR submitted to EDS on  (Waiting on Citronelle Must to call me back. It says my information is incorrect.)     PASRR number received on       Existing PASRR number confirmed on       FL2 transmitted to all facilities in geographic area requested by pt/family on 12/17/16     FL2 transmitted to all facilities within larger geographic area on       Patient informed that his/her managed care company has contracts with or will negotiate with certain facilities, including the following:            Patient/family informed of bed offers received.  Patient chooses bed at       Physician recommends and patient chooses bed at      Patient to be transferred to   on  .  Patient to be transferred to facility by       Patient family notified on   of transfer.  Name of family member notified:        PHYSICIAN Please sign FL2     Additional Comment:     _______________________________________________ Candie Chroman, LCSW 12/17/2016, 2:32 PM

## 2016-12-17 NOTE — PMR Pre-admission (Signed)
PMR Admission Coordinator Pre-Admission Assessment  Patient: Ellen Henry is an 71 y.o., female MRN: [id number] DOB: 11/21/1946 Height: '5\' 8"'  (172.7 cm) Weight: 104.3 kg (230 lb)              Insurance Information HMO: Yes   PPO:       PCP:       IPA:       80/20:       OTHER:  Group # [id number] PRIMARY:  UHC medicare      Policy#: [id number]      Subscriber:  [name] CM Name: Sherlynn Stalls      Phone#: [phone]     Fax#:  [phone] (has EPIC access) Pre-Cert#:  [id number]      Employer:  Retired Benefits:  Phone #: [phone]     Name:  Hanley Seamen. Date: 12/07/16     Deduct:  $0      Out of Pocket Max: [phone] (met $0)      Life Max:  unlimited CIR: $430 days 1-4      SNF:  $0 days 1-20; $160 days 21-62; $0 days 63-100 Outpatient:  Medical necessity     Co-Pay: $40/visit Home Health: 100%      Co-Pay: none DME: 80%     Co-Pay: 20% Providers: in network  Medicaid Application Date:        Case Manager:   Disability Application Date:        Case Worker:    Emergency Contact Information Contact Information    Name Relation Home Work Mobile   Ross,Diea Daughter [phone]  [phone]   Rob Bunting Daughter   [phone]   Allene Pyo Mother [phone]     Smith,Vertella Daughter   [phone]     Current Medical History  Patient Admitting Diagnosis:  R IC infarct  History of Present Illness: A 71 y.o. female with history of HTN, chronic back pain with radiculopathy, OSA who was admitted on 12/14/16 with complaints of diffuse weakness. She was found to have left facial weakness on evaluation by EMS and symptoms resolved in ED therefore tPA not administered. CT head negative.  She developed left sided weakness later that evening and CTA head/neck without perfusion abnormality or occlusion/high grade stenosis. Therefore not endovascular candidate. 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 She was started on ASA  for secondary stroke prevention and Work up initiated. Therapy evaluations done and limited by pain as well as decreased balance at EOB. BSS with signs of aspiration and patient placed on dysphagia 2, nectar liquids. She has had difficulty voiding with  PVR .700cc this am.  CIR recommended for follow up therapy.   Total: 7=NIH  Past Medical History  Past Medical History:  Diagnosis Date  . Arthritis   . Dry skin   . GERD (gastroesophageal reflux disease)    "years ago", diet controlled  . Gout   . H/O pyelonephritis    as a child  . 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 Hypertension   . Neuropathy ([place])   . Pneumonia   . Radiculopathy   . Sleep apnea    does not use cpap    Family History  family history includes Varicose Veins in her mother.  Prior Rehab/Hospitalizations: Was at [facility] after back surgery in the past.  Had rehab after knee surgery 2015.  Has the patient had major surgery during 100 days prior to admission?  No  Current Medications   Current Facility-Administered Medications:  .  0.9 % NaCl with KCl 20 mEq/ L  infusion, , Intravenous, Continuous, Reyne Dumas, MD, Last Rate: 75 mL/hr at 12/17/16 0545 .  acetaminophen (TYLENOL) tablet 650 mg, 650 mg, Oral, Q4H PRN **OR** acetaminophen (TYLENOL) solution 650 mg, 650 mg, Per Tube, Q4H PRN **OR** acetaminophen (TYLENOL) suppository 650 mg, 650 mg, Rectal, Q4H PRN, Etta Quill, DO, 650 mg at 12/16/16 0855 .  alum & mag hydroxide-simeth (MAALOX/MYLANTA) 200-200-20 MG/5ML suspension 30 mL, 30 mL, Oral, Q6H PRN, Reyne Dumas, MD, 30 mL at 12/17/16 1446 .  aspirin EC tablet 325 mg, 325 mg, Oral, Daily, Etta Quill, DO, 325 mg at 12/17/16 1027 .  atorvastatin (LIPITOR) tablet 40 mg, 40 mg, Oral, q1800, Etta Quill, DO, 40 mg at 12/16/16 1753 .  chlorhexidine (PERIDEX) 0.12 % solution 15 mL, 15 mL, Mouth Rinse, BID, Reyne Dumas, MD, 15 mL at 12/17/16 1024 .  enoxaparin (LOVENOX) injection 40  mg, 40 mg, Subcutaneous, Q24H, Jared M Gardner, DO, 40 mg at 12/17/16 1022 .  food thickener (THICK IT) powder, , Oral, PRN, Reyne Dumas, MD .  hydrALAZINE (APRESOLINE) injection 5 mg, 5 mg, Intravenous, Q4H PRN, Reyne Dumas, MD .  MEDLINE mouth rinse, 15 mL, Mouth Rinse, q12n4p, Reyne Dumas, MD, 15 mL at 12/16/16 1600 .  methocarbamol (ROBAXIN) tablet 500 mg, 500 mg, Oral, Q8H PRN, Reyne Dumas, MD, 500 mg at 12/17/16 0802 .  nicotine (NICODERM CQ - dosed in mg/24 hr) patch 7 mg, 7 mg, Transdermal, Daily, Etta Quill, DO, 7 mg at 12/17/16 1028 .  oxyCODONE (Oxy IR/ROXICODONE) immediate release tablet 5 mg, 5 mg, Oral, Q8H PRN, Reyne Dumas, MD, 5 mg at 12/17/16 0802 .  potassium chloride (KLOR-CON) packet 40 mEq, 40 mEq, Oral, BID, Reyne Dumas, MD, 40 mEq at 12/17/16 1022 .  senna-docusate (Senokot-S) tablet 1 tablet, 1 tablet, Oral, QHS PRN, Etta Quill, DO, 1 tablet at 12/17/16 1447  Patients Current Diet: DIET DYS 2 Room service appropriate? Yes; Fluid consistency: Thin Diet - low sodium heart healthy  Precautions / Restrictions Precautions Precautions: Fall Precaution Comments: L inattention to arm; L sided weakness Restrictions Weight Bearing Restrictions: No   Has the patient had 2 or more falls or a fall with injury in the past year?Yes.  Reports 4-5 falls with no injury.  Prior Activity Level Community (5-7x/wk): Went out daily.  Was driving.  Home Assistive Devices / Equipment Home Assistive Devices/Equipment: Eyeglasses, Dentures (specify type) Home Equipment: Walker - 2 wheels, Cane - quad, Bedside commode  Prior Device Use: Indicate devices/aids used by the patient prior to current illness, exacerbation or injury? Walker and [employer].  Has not used a walker in a year.  Kasandra Knudsen is in her closet and used at times.  Prior Functional Level Prior Function Level of Independence: Independent Comments: Per pt, able to   Self Care: Did the patient need help bathing,  dressing, using the toilet or eating?  Walker and [employer] Mobility: Did the patient need assistance with walking from room to room (with or without device)? Independent  Stairs: Did the patient need assistance with internal or external stairs (with or without device)? Independent  Functional Cognition: Did the patient need help planning regular tasks such as shopping or remembering to take medications? Independent  Current Functional Level Cognition  Arousal/Alertness: Awake/alert Overall Cognitive Status: Within Functional Limits for tasks assessed Orientation Level: Oriented X4 General Comments:  Pt impulsive to get out of bed once told about treatment of standing today. Required cues for sitting still.  Attention: Sustained Sustained Attention: Appears intact Memory: Appears intact Awareness: Appears intact Problem Solving: Appears intact Safety/Judgment: Appears intact    Extremity Assessment (includes Sensation/Coordination)  Upper Extremity Assessment: LUE deficits/detail LUE Deficits / Details: Limited AROM and strength grossly. Inattention to L UE when moving to EOB. Brunstom level II hand and arm. Able to initiate slight movement but within flexor synergy pattern. LUE Coordination: decreased fine motor, decreased gross motor  Lower Extremity Assessment: Defer to PT evaluation LLE Deficits / Details: Decreased strength and AROM. Was able to demonstrate at least 3/5 strength in hip flexors and quads.     ADLs  Overall ADL's : Needs assistance/impaired Eating/Feeding Details (indicate cue type and reason): Pt NPO Grooming: Moderate assistance, Sitting Upper Body Bathing: Maximal assistance, Sitting Lower Body Bathing: Maximal assistance, Sitting/lateral leans Upper Body Dressing : Maximal assistance, Sitting Lower Body Dressing: Maximal assistance, Sitting/lateral leans General ADL Comments: Pt unable to tolerate more than sitting at EOB due to reported back pain.  Leaning side to side frequently potentially due to pain. Decreased awareness of L UE and noted this becoming pinned behind her when completing bed mobility.    Mobility  Overal bed mobility: Needs Assistance Bed Mobility: Supine to Sit, Sidelying to Sit Rolling: Supervision Supine to sit: Min assist, +2 for physical assistance Sit to sidelying: Mod assist General bed mobility comments: Min assist for trunk elevation and safety  with heavy dependence of railing using R UE. L UE inattention and min assist with L LE during transfer.     Transfers  Overall transfer level: Needs assistance Equipment used:  Charlaine Dalton) Transfer via Lift Equipment: Stedy Transfers: Sit to/from Stand Sit to Stand: Mod assist, +2 physical assistance General transfer comment: Pt requiring v/c for hip ext during sit to stand and postural awareness. Mod assist for L UE due to weakness and lack of coordination to keep hand on stedy. Knee instability observed in stedy frame; pt will likely need L knee block during progression to standing with walker.     Ambulation / Gait / Stairs / Office manager / Balance Dynamic Sitting Balance Sitting balance - Comments: required mod v/c for upright posture during upper body dressing.  Balance Overall balance assessment: Needs assistance Sitting-balance support: Feet supported Sitting balance-Leahy Scale: Poor Sitting balance - Comments: required mod v/c for upright posture during upper body dressing.  Postural control: Posterior lean, Right lateral lean, Left lateral lean Standing balance support: Bilateral upper extremity supported Standing balance-Leahy Scale: Poor Standing balance comment: required stedy for standing to block L knee and holding to rail for B UE support.     Special needs/care consideration BiPAP/CPAP No CPM No Continuous Drip IV 0.9% NS with 20 meq KCl at 75 mL/hr Dialysis No       Life Vest No Oxygen No Special Bed No Trach Size  No Wound Vac (area) No     Skin No                              Bowel mgmt: Last BM 12/15/16 Bladder mgmt: Urgency, incontinence Diabetic mgmt No    Previous Home Environment Living Arrangements: Children, Other relatives  Lives With: Daughter (grandkids) Available Help at Discharge: Family, Available PRN/intermittently Type of Home: House Home Layout: One level Home Access:  Stairs to enter Entrance Stairs-Rails: None Entrance Stairs-Number of Steps: 3 Bathroom Shower/Tub: Optometrist: Yes Home Care Services: No Additional Comments: Most details of home environment were taken from a previous admission. Poor historian at time of eval.   Discharge Living Setting Plans for Discharge Living Setting: House, Lives with (comment) (Lives with daughter, 2 grandsons, 1 granddaughter.) Type of Home at Discharge: House Discharge Home Layout: One level Discharge Home Access: Stairs to enter Entrance Stairs-Number of Steps: 3 steps Does the patient have any problems obtaining your medications?: Yes (Describe) (financial; retired)  Radiographer, therapeutic Patient Roles: Parent, Other (Comment) (Has 3 daughters, grandchildren.) Contact Information: Diea Harrington Challenger - daughter Marland Kitchen [phone] Anticipated Caregiver: Daughters and grandchildren. Anticipated Caregiver's Contact Information: Eduard Roux - [phone] Ability/Limitations of Caregiver: Daughters are returning to work, but they can arrange caregiver for patient while they work. Caregiver Availability: 24/7 Discharge Plan Discussed with Primary Caregiver: Yes Is Caregiver In Agreement with Plan?: Yes Does Caregiver/Family have Issues with Lodging/Transportation while Pt is in Rehab?: No  Goals/Additional Needs Patient/Family Goal for Rehab: PT mod I to supervision, OT supervision to min assist, SLP mod I goals Expected length of stay: 17-24 days Cultural Considerations:  None Equipment Needs: TBD Pt/Family Agrees to Admission and willing to participate: Yes Program Orientation Provided & Reviewed with Pt/Caregiver Including Roles  & Responsibilities: Yes  Decrease burden of Care through IP rehab admission:  N/A  Possible need for SNF placement upon discharge: Not planned  Patient Condition: This patient's condition remains as documented in the consult dated 12/17/16, in which the Rehabilitation Physician determined and documented that the patient's condition is appropriate for intensive rehabilitative care in an inpatient rehabilitation facility. Will admit to inpatient rehab today.   Preadmission Screen Completed By:  Retta Diones, 12/17/2016 3:07 PM ______________________________________________________________________   Discussed status with Dr. Letta Pate on 12/17/16 at 1507 and received telephone approval for admission today.  Admission Coordinator:  Retta Diones, time1507/Date01/11/18

## 2016-12-17 NOTE — Clinical Social Work Note (Signed)
Patient discharging to CIR today.  CSW signing off.   Sarah Boswell, CSW [phone]  

## 2016-12-17 NOTE — Progress Notes (Signed)
  2D Echocardiogram has been performed.  Ellen Henry 12/17/2016, 12:40 PM

## 2016-12-17 NOTE — Progress Notes (Signed)
Meredith Staggers, MD Physician Signed Physical Medicine and Rehabilitation  Consult Note Date of Service: 12/17/2016 8:37 AM  Related encounter: ED to Hosp-Admission (Current) from 12/15/2016 in [place] All Collapse All   [] Hide copied text [] Hover for attribution information      Physical Medicine and Rehabilitation Consult   Reason for Consult: Left sided weakness and dysphagia.  Referring Physician: Dr. Leonie Man   HPI: Ellen Henry is a 71 y.o. female with history of HTN, chronic back pain with radiculopathy, OSA who was admitted on 12/14/16 with complaints of diffuse weakness. She was found to have left facial weakness on evaluation by EMS and symptoms resolved in ED therefore tPA not administered. CT head negative.  She developed left sided weakness later that evening and CTA head/neck without perfusion abnormality or occlusion/high grade stenosis. Therefore not endovascular candidate. 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 She was started on ASA for secondary stroke prevention and Work up initiated. Therapy evaluations done and limited by pain as well as decreased balance at EOB. BSS with signs of aspiration and patient placed on dysphagia 2, nectar liquids. She has had difficulty voiding with  PVR .700cc this am.  CIR recommended for follow up therapy.   Review of Systems  HENT: Negative for hearing loss and tinnitus.   Eyes: Negative for blurred vision and double vision.  Respiratory: Negative for cough, shortness of breath and wheezing.   Cardiovascular: Negative for chest pain and palpitations.  Gastrointestinal: Positive for constipation (uses suppository every other day). Negative for heartburn and nausea.  Genitourinary: Negative for dysuria and urgency.       H/o urinary retention post op  Musculoskeletal: Positive for back pain (radiates to RLE--out of neurontin) and  joint pain (chronic knee pain).  Skin: Negative for itching and rash.  Neurological: Positive for speech change, focal weakness, weakness and headaches (since stroke).  Psychiatric/Behavioral: Negative for depression. The patient is nervous/anxious and has insomnia.           Past Medical History:  Diagnosis Date  . Arthritis   . Dry skin   . GERD (gastroesophageal reflux disease)    "years ago", diet controlled  . Gout   . H/O pyelonephritis    as a child  . 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 Hypertension   . Neuropathy ([place])   . Pneumonia   . Radiculopathy   . Sleep apnea    does not use cpap         Past Surgical History:  Procedure Laterality Date  . ABDOMINAL EXPOSURE N/A 08/29/2014   Procedure: ABDOMINAL EXPOSURE;  Surgeon: Rosetta Posner, MD;  Location: [facility];  Service: Vascular;  Laterality: N/A;  . ABDOMINAL HYSTERECTOMY    . ANTERIOR LUMBAR FUSION N/A 08/29/2014   Procedure: ANTERIOR LUMBAR FUSION 1 LEVEL;  Surgeon: Sinclair Ship, MD;  Location: [facility];  Service: Orthopedics;  Laterality: N/A;  Lumbar 4-5 anterior lumbar interbody fusion with allograft and instrumentation.  . APPENDECTOMY    . BACK SURGERY  08/28/2014 and 08/29/2014   lumbar fusion  . CARPAL TUNNEL RELEASE Right    release done twice  . CARPAL TUNNEL RELEASE Right    x 2  . COLONOSCOPY    . JOINT REPLACEMENT    . JOINT REPLACEMENT     right knee  . REPLACEMENT TOTAL  KNEE  2010   rigth knee  . TONSILLECTOMY    . TOTAL KNEE ARTHROPLASTY Left 02/15/2015   Procedure: TOTAL KNEE ARTHROPLASTY;  Surgeon: [name], MD;  Location: [facility];  Service: Orthopedics;  Laterality: Left;         Family History  Problem Relation Age of Onset  . Varicose Veins Mother     Social History:  Lives with daughter and grandchildren. 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 Helps take care of grandchildren. Does an exercise program twice a  week. She reports that she has been smoking Cigarettes --almost a pack per day. She has never used smokeless tobacco. She reports that she does not drink alcohol or use drugs.        Allergies  Allergen Reactions  . Food Allergy Formula Other (See Comments)    Shellfish-banana-beef-flares up her gout          Medications Prior to Admission  Medication Sig Dispense Refill  . acetaminophen (TYLENOL) 500 MG tablet Take 1,000 mg by mouth every 6 (six) hours as needed for mild pain.    Marland Kitchen aspirin EC 81 MG tablet Take 81 mg by mouth every 6 (six) hours as needed for mild pain.    Marland Kitchen colchicine 0.6 MG tablet Take 1 tablet (0.6 mg total) by mouth daily. (Patient taking differently: Take 0.6 mg by mouth daily as needed (pain). ) 1 tablet 0  . ibuprofen (ADVIL,MOTRIN) 200 MG tablet Take 400 mg by mouth every 6 (six) hours as needed for moderate pain.    . indomethacin (INDOCIN) 25 MG capsule Take 1 po TID with food x 3 d then 1 po BID x 3d then 1 po QD x 3d (Patient taking differently: Take 25 mg by mouth daily as needed for mild pain. ) 18 capsule 0    Home: Home Living Family/patient expects to be discharged to:: Private residence Living Arrangements: Children, Other relatives Available Help at Discharge: Family, Available PRN/intermittently Type of Home: House Home Access: Stairs to enter Technical brewer of Steps: 3 Entrance Stairs-Rails: None Home Layout: One level Bathroom Shower/Tub: Chiropodist: Standard Bathroom Accessibility: Yes Home Equipment: Environmental consultant - 2 wheels, Cane - quad, Bedside commode Additional Comments: Most details of home environment were taken from a previous admission. Poor historian at time of eval.   Functional History: Prior Function Level of Independence: Independent Comments: Per pt, able to  Functional Status:  Mobility: Bed Mobility Overal bed mobility: Needs Assistance Bed Mobility: Rolling, Supine to Sit, Sit to  Sidelying Rolling: Supervision Supine to sit: Min assist Sit to sidelying: Mod assist General bed mobility comments: Assist for trunk elevation to full sitting position, as well as management of LLE off bed/elevation back to bed. Heavy use of rails and LUE inattention.  Transfers General transfer comment: Did not attempt further transfer OOB at this time due to pt's inability to sit EOB due to pain, and decreased ability to follow commands at this time (appears to be behavioral more than true deficits.)      ADL: ADL Overall ADL's : Needs assistance/impaired Eating/Feeding Details (indicate cue type and reason): Pt NPO Grooming: Moderate assistance, Sitting Upper Body Bathing: Maximal assistance, Sitting Lower Body Bathing: Maximal assistance, Sitting/lateral leans Upper Body Dressing : Maximal assistance, Sitting Lower Body Dressing: Maximal assistance, Sitting/lateral leans General ADL Comments: Pt unable to tolerate more than sitting at EOB due to reported back pain. Leaning side to side frequently potentially due to pain. Decreased awareness of L  UE and noted this becoming pinned behind her when completing bed mobility.  Cognition: Cognition Overall Cognitive Status: No family/caregiver present to determine baseline cognitive functioning Orientation Level: Oriented to person, Oriented to place, Oriented to situation, Disoriented to time Cognition Arousal/Alertness: Awake/alert Behavior During Therapy: Restless, Anxious, Impulsive Overall Cognitive Status: No family/caregiver present to determine baseline cognitive functioning General Comments: Pt writhing in bed. Alert and oriented but perseverating on pain.   Blood pressure (!) 158/87, pulse 82, temperature 98.3 F (36.8 C), temperature source Oral, resp. rate 20, height 5\' 8"  (1.727 m), weight 104.3 kg (230 lb), SpO2 97 %. Physical Exam  Nursing note and vitals reviewed. Constitutional: She is oriented to person, place,  and time. She appears well-nourished.  Up in bed eating breakfast.   HENT:  Head: Normocephalic and atraumatic.  Mouth/Throat: Oropharynx is clear and moist.  Eyes: Conjunctivae and EOM are normal. Pupils are equal, round, and reactive to light.  Neck: Normal range of motion. Neck supple.  Cardiovascular: Normal rate and regular rhythm.   No murmur heard. Respiratory: Effort normal and breath sounds normal. No stridor. She has no wheezes. She has no rales.  GI: Bowel sounds are normal. She exhibits no distension. There is no tenderness.  Musculoskeletal: She exhibits no edema or tenderness.  Well healed old B-TKR incisions.   Neurological: She is alert and oriented to person, place, and time.  Left facial droop with mild dysarthria. No obvious tongue deviation. Visual fields intact.. Able to answer orientation questions without difficulty. Reasonable insight and awareness. Has poor postural reflexes but able to self correct in bed. Mild left inattention. LUE: deltoid 2-, bicep 2-, tricep 1, wrist/hand tr to 1-. LLE: 2-HF, 2KE and trace ADF/PF. Sensation intact left leg arm.   Skin: Skin is warm and dry.  Psychiatric: She has a normal mood and affect. Her behavior is normal. Thought content normal.    Lab Results Last 24 Hours       Results for orders placed or performed during the hospital encounter of 12/15/16 (from the past 24 hour(s))  Basic metabolic panel     Status: None   Collection Time: 12/16/16  7:29 PM  Result Value Ref Range   Sodium 141 135 - 145 mmol/L   Potassium 3.5 3.5 - 5.1 mmol/L   Chloride 105 101 - 111 mmol/L   CO2 28 22 - 32 mmol/L   Glucose, Bld 98 65 - 99 mg/dL   BUN 6 6 - 20 mg/dL   Creatinine, Ser 0.62 0.44 - 1.00 mg/dL   Calcium 9.4 8.9 - 10.3 mg/dL   GFR calc non Af Amer >60 >60 mL/min   GFR calc Af Amer >60 >60 mL/min   Anion gap 8 5 - 15  CBC     Status: None   Collection Time: 12/17/16  6:36 AM  Result Value Ref Range   WBC 9.0 4.0  - 10.5 K/uL   RBC 4.69 3.87 - 5.11 MIL/uL   Hemoglobin 13.0 12.0 - 15.0 g/dL   HCT 38.8 36.0 - 46.0 %   MCV 82.7 78.0 - 100.0 fL   MCH 27.7 26.0 - 34.0 pg   MCHC 33.5 30.0 - 36.0 g/dL   RDW 14.6 11.5 - 15.5 %   Platelets 253 150 - 400 K/uL  Comprehensive metabolic panel     Status: Abnormal   Collection Time: 12/17/16  6:36 AM  Result Value Ref Range   Sodium 143 135 - 145 mmol/L   Potassium  3.3 (L) 3.5 - 5.1 mmol/L   Chloride 108 101 - 111 mmol/L   CO2 26 22 - 32 mmol/L   Glucose, Bld 106 (H) 65 - 99 mg/dL   BUN 6 6 - 20 mg/dL   Creatinine, Ser 0.65 0.44 - 1.00 mg/dL   Calcium 9.3 8.9 - 10.3 mg/dL   Total Protein 7.1 6.5 - 8.1 g/dL   Albumin 3.3 (L) 3.5 - 5.0 g/dL   AST 21 15 - 41 U/L   ALT 15 14 - 54 U/L   Alkaline Phosphatase 110 38 - 126 U/L   Total Bilirubin 0.8 0.3 - 1.2 mg/dL   GFR calc non Af Amer >60 >60 mL/min   GFR calc Af Amer >60 >60 mL/min   Anion gap 9 5 - 15      Imaging Results (Last 48 hours)  Ct Angio Head W Or Wo Contrast  Result Date: 12/16/2016 CLINICAL DATA:  Weakness EXAM: CT PERFUSION BRAIN CT ANGIOGRAPHY HEAD CT ANGIOGRAPHY NECK TECHNIQUE: Multiphase CT imaging of the brain was performed following IV bolus contrast injection. Subsequent parametric perfusion maps were calculated using RAPID software. Multidetector CT imaging of the head and neck was performed using the standard protocol during bolus administration of intravenous contrast. Multiplanar CT image reconstructions and MIPs were obtained to evaluate the vascular anatomy. Carotid stenosis measurements (when applicable) are obtained utilizing NASCET criteria, using the distal internal carotid diameter as the denominator. CONTRAST:  90 mL Isovue 370 IV COMPARISON:  None. FINDINGS: CT HEAD FINDINGS Brain: No mass lesion, intraparenchymal hemorrhage or extra-axial collection. No evidence of acute cortical infarct. AREAS of hypoattenuation in the right basal ganglia and left  frontal white matter are suggestive of chronic infarcts. Vascular: No hyperdense vessel or unexpected calcification. Skull: Normal visualized skull base, calvarium and extracranial soft tissues. Sinuses/Orbits: Ethmoid and maxillary mild mucosal thickening with atelectatic right maxillary sinus. Normal orbits. CTA NECK FINDINGS Aortic arch: There is no aneurysm or dissection of the visualized ascending aorta or aortic arch. There is a normal variant aortic arch branching pattern with the brachiocephalic and left common carotid arteries sharing a common origin. The proximal subclavian arteries are patent. There is mild calcification of the aortic arch. Right carotid system: The right common carotid origin is widely patent. There is no common carotid or internal carotid artery dissection or aneurysm. There is mild atherosclerotic calcification at the carotid bifurcation without hemodynamically significant stenosis. Left carotid system: The left common carotid origin is widely patent. There is no common carotid or internal carotid artery dissection or aneurysm. There is mild atherosclerotic calcification at the carotid bifurcation without hemodynamically significant stenosis. Vertebral arteries: The vertebral system is mildly right-dominant. Both vertebral artery origins are normal. Both vertebral arteries are normal to their confluence with the basilar artery. Skeleton: There is no bony spinal canal stenosis. No lytic or blastic lesions. Other neck: The nasopharynx is clear. The oropharynx and hypopharynx are normal. The epiglottis is normal. The supraglottic larynx, glottis and subglottic larynx are normal. No retropharyngeal collection. The parapharyngeal spaces are preserved. The parotid and submandibular glands are normal. No sialolithiasis or salivary ductal dilatation. The thyroid gland is normal. There is no cervical lymphadenopathy. Upper chest: No pneumothorax or pleural effusion. No nodules or masses. Review  of the MIP images confirms the above findings CTA HEAD FINDINGS Anterior circulation: --Intracranial internal carotid arteries: There is atherosclerotic calcification of the bilateral cavernous and clinoid segments without advanced stenosis. --Anterior cerebral arteries: There is an absent right A1  segment, a congenital variant. Otherwise normal. --Middle cerebral arteries: Normal. --Posterior communicating arteries: Small P-comm is present on the right. Posterior circulation: --Posterior cerebral arteries: Normal. --Superior cerebellar arteries: Normal. --Basilar artery: Normal. --Anterior inferior cerebellar arteries: Normal. --Posterior inferior cerebellar arteries: Normal. Venous sinuses: As permitted by contrast timing, patent. Anatomic variants: Absent right anterior cerebral artery A1 segment, a normal variant. Review of the MIP images confirms the above findings CT Brain Perfusion Findings: CBF (<30%) Volume: 52mL Perfusion (Tmax>6.0s) volume: 70mL Mismatch Volume: 64mL Infarction Location:None IMPRESSION: 1. No perfusion abnormality or other evidence of acute infarct. 2. No intracranial arterial occlusion or high-grade stenosis. 3. Mild carotid bifurcation atherosclerosis without hemodynamically significant stenosis. 4. Mild aortic atherosclerosis. Electronically Signed   By: Ulyses Jarred M.D.   On: 12/16/2016 01:02   Dg Chest 2 View  Result Date: 12/15/2016 CLINICAL DATA:  Productive cough, shortness of breath for 2-3 months. History of hypertension, pneumonia, smoker. EXAM: CHEST  2 VIEW COMPARISON:  Chest radiograph October 26, 2014 FINDINGS: Cardiac silhouette is normal. Calcified aortic knob. Unchanged fullness the RIGHT hilum. No pleural effusion or focal consolidation. No pneumothorax. Soft tissue planes and included osseous structures are nonsuspicious, mild degenerative change of the thoracic spine. Partially imaged lumbar hardware. IMPRESSION: No acute cardiopulmonary process, stable  examination. Electronically Signed   By: Elon Alas M.D.   On: 12/15/2016 22:00   Ct Head Wo Contrast  Result Date: 12/15/2016 CLINICAL DATA:  Code stroke.  71 y/o  F; left-sided weakness. EXAM: CT HEAD WITHOUT CONTRAST TECHNIQUE: Contiguous axial images were obtained from the base of the skull through the vertex without intravenous contrast. COMPARISON:  07/21/2005 CT head. FINDINGS: Brain: No evidence for large territory infarct, intracranial hemorrhage, or focal mass effect. There are several small lucencies in the basal ganglia bilaterally and in the left anterior and right posterior corona radiata compatible with age-indeterminate lacunar infarcts. There are mild chronic microvascular ischemic changes and mild volume loss of the brain parenchyma Vascular: No hyperdense vessel. Moderate calcific atherosclerosis of skullbase internal carotid arteries. Skull: Normal. Negative for fracture or focal lesion. Sinuses/Orbits: Mild diffuse paranasal sinus mucosal thickening. Normally pneumatized mastoid air cells. Orbits are unremarkable. Other: None. ASPECTS [facility] Stroke Program Early CT Score) - Ganglionic level infarction (caudate, lentiform nuclei, internal capsule, insula, M1-M3 cortex): 7 - Supraganglionic infarction (M4-M6 cortex): 3 Total score (0-10 with 10 being normal): 10 IMPRESSION: 1. No evidence for large territory infarct, intracranial hemorrhage, or focal mass effect. 2. Several new small lucencies from 2006 in the basal ganglia bilaterally as well as the left anterior and right posterior corona radiata are compatible with age-indeterminate lacunar infarcts. There are mild chronic microvascular ischemic changes and mild volume loss of the brain parenchyma 3. ASPECTS is 10 These results were called by telephone at the time of interpretation on 12/15/2016 at 9:19 pm to Dr. Cheral Marker who verbally acknowledged these results. Electronically Signed   By: Kristine Garbe M.D.   On:  12/15/2016 21:21   Ct Angio Neck W And/or Wo Contrast  Result Date: 12/16/2016 CLINICAL DATA:  Weakness EXAM: CT PERFUSION BRAIN CT ANGIOGRAPHY HEAD CT ANGIOGRAPHY NECK TECHNIQUE: Multiphase CT imaging of the brain was performed following IV bolus contrast injection. Subsequent parametric perfusion maps were calculated using RAPID software. Multidetector CT imaging of the head and neck was performed using the standard protocol during bolus administration of intravenous contrast. Multiplanar CT image reconstructions and MIPs were obtained to evaluate the vascular anatomy. Carotid stenosis measurements (when applicable)  are obtained utilizing NASCET criteria, using the distal internal carotid diameter as the denominator. CONTRAST:  90 mL Isovue 370 IV COMPARISON:  None. FINDINGS: CT HEAD FINDINGS Brain: No mass lesion, intraparenchymal hemorrhage or extra-axial collection. No evidence of acute cortical infarct. AREAS of hypoattenuation in the right basal ganglia and left frontal white matter are suggestive of chronic infarcts. Vascular: No hyperdense vessel or unexpected calcification. Skull: Normal visualized skull base, calvarium and extracranial soft tissues. Sinuses/Orbits: Ethmoid and maxillary mild mucosal thickening with atelectatic right maxillary sinus. Normal orbits. CTA NECK FINDINGS Aortic arch: There is no aneurysm or dissection of the visualized ascending aorta or aortic arch. There is a normal variant aortic arch branching pattern with the brachiocephalic and left common carotid arteries sharing a common origin. The proximal subclavian arteries are patent. There is mild calcification of the aortic arch. Right carotid system: The right common carotid origin is widely patent. There is no common carotid or internal carotid artery dissection or aneurysm. There is mild atherosclerotic calcification at the carotid bifurcation without hemodynamically significant stenosis. Left carotid system: The left  common carotid origin is widely patent. There is no common carotid or internal carotid artery dissection or aneurysm. There is mild atherosclerotic calcification at the carotid bifurcation without hemodynamically significant stenosis. Vertebral arteries: The vertebral system is mildly right-dominant. Both vertebral artery origins are normal. Both vertebral arteries are normal to their confluence with the basilar artery. Skeleton: There is no bony spinal canal stenosis. No lytic or blastic lesions. Other neck: The nasopharynx is clear. The oropharynx and hypopharynx are normal. The epiglottis is normal. The supraglottic larynx, glottis and subglottic larynx are normal. No retropharyngeal collection. The parapharyngeal spaces are preserved. The parotid and submandibular glands are normal. No sialolithiasis or salivary ductal dilatation. The thyroid gland is normal. There is no cervical lymphadenopathy. Upper chest: No pneumothorax or pleural effusion. No nodules or masses. Review of the MIP images confirms the above findings CTA HEAD FINDINGS Anterior circulation: --Intracranial internal carotid arteries: There is atherosclerotic calcification of the bilateral cavernous and clinoid segments without advanced stenosis. 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 Otherwise normal. --Middle cerebral arteries: Normal. --Posterior communicating arteries: Small P-comm is present on the right. Posterior circulation: --Posterior cerebral arteries: Normal. --Superior cerebellar arteries: Normal. --Basilar artery: Normal. --Anterior inferior cerebellar arteries: Normal. --Posterior inferior cerebellar arteries: Normal. Venous sinuses: As permitted by contrast timing, patent. Anatomic variants: Absent right anterior cerebral artery A1 segment, a normal variant. Review of the MIP images confirms the above findings CT Brain Perfusion Findings: CBF (<30%) Volume: 71mL Perfusion (Tmax>6.0s)  volume: 71mL Mismatch Volume: 78mL Infarction Location:None IMPRESSION: 1. No perfusion abnormality or other evidence of acute infarct. 2. No intracranial arterial occlusion or high-grade stenosis. 3. Mild carotid bifurcation atherosclerosis without hemodynamically significant stenosis. 4. Mild aortic atherosclerosis. Electronically Signed   By: Ulyses Jarred M.D.   On: 12/16/2016 01:02   Mr Brain Wo Contrast  Result Date: 12/16/2016 CLINICAL DATA:  Left-sided weakness. EXAM: MRI HEAD WITHOUT CONTRAST TECHNIQUE: Multiplanar, multiecho pulse sequences of the brain and surrounding structures were obtained without intravenous contrast. COMPARISON:  CT head 12/16/2016. FINDINGS: The patient was not able to complete the study. Axial diffusion-weighted imaging demonstrates small area of acute infarct posterior limb internal capsule on the right. Axial diffusion is degraded by motion. No other imaging was performed. There is chronic ischemic change in the deep white matter bilaterally. No hydrocephalus. IMPRESSION: Incomplete and limited study Acute infarct  posterior limb internal capsule on the right. Electronically Signed   By: Franchot Gallo M.D.   On: 12/16/2016 08:10   Ct Cerebral Perfusion W Contrast  Result Date: 12/16/2016 CLINICAL DATA:  Weakness EXAM: CT PERFUSION BRAIN CT ANGIOGRAPHY HEAD CT ANGIOGRAPHY NECK TECHNIQUE: Multiphase CT imaging of the brain was performed following IV bolus contrast injection. Subsequent parametric perfusion maps were calculated using RAPID software. Multidetector CT imaging of the head and neck was performed using the standard protocol during bolus administration of intravenous contrast. Multiplanar CT image reconstructions and MIPs were obtained to evaluate the vascular anatomy. Carotid stenosis measurements (when applicable) are obtained utilizing NASCET criteria, using the distal internal carotid diameter as the denominator. CONTRAST:  90 mL Isovue 370 IV COMPARISON:   None. FINDINGS: CT HEAD FINDINGS Brain: No mass lesion, intraparenchymal hemorrhage or extra-axial collection. No evidence of acute cortical infarct. AREAS of hypoattenuation in the right basal ganglia and left frontal white matter are suggestive of chronic infarcts. Vascular: No hyperdense vessel or unexpected calcification. Skull: Normal visualized skull base, calvarium and extracranial soft tissues. Sinuses/Orbits: Ethmoid and maxillary mild mucosal thickening with atelectatic right maxillary sinus. Normal orbits. CTA NECK FINDINGS Aortic arch: There is no aneurysm or dissection of the visualized ascending aorta or aortic arch. There is a normal variant aortic arch branching pattern with the brachiocephalic and left common carotid arteries sharing a common origin. The proximal subclavian arteries are patent. There is mild calcification of the aortic arch. Right carotid system: The right common carotid origin is widely patent. There is no common carotid or internal carotid artery dissection or aneurysm. There is mild atherosclerotic calcification at the carotid bifurcation without hemodynamically significant stenosis. Left carotid system: The left common carotid origin is widely patent. There is no common carotid or internal carotid artery dissection or aneurysm. There is mild atherosclerotic calcification at the carotid bifurcation without hemodynamically significant stenosis. Vertebral arteries: The vertebral system is mildly right-dominant. Both vertebral artery origins are normal. Both vertebral arteries are normal to their confluence with the basilar artery. Skeleton: There is no bony spinal canal stenosis. No lytic or blastic lesions. Other neck: The nasopharynx is clear. The oropharynx and hypopharynx are normal. The epiglottis is normal. The supraglottic larynx, glottis and subglottic larynx are normal. No retropharyngeal collection. The parapharyngeal spaces are preserved. The parotid and submandibular  glands are normal. No sialolithiasis or salivary ductal dilatation. The thyroid gland is normal. There is no cervical lymphadenopathy. Upper chest: No pneumothorax or pleural effusion. No nodules or masses. Review of the MIP images confirms the above findings CTA HEAD FINDINGS Anterior circulation: --Intracranial internal carotid arteries: There is atherosclerotic calcification of the bilateral cavernous and clinoid segments without advanced stenosis. 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 Otherwise normal. --Middle cerebral arteries: Normal. --Posterior communicating arteries: Small P-comm is present on the right. Posterior circulation: --Posterior cerebral arteries: Normal. --Superior cerebellar arteries: Normal. --Basilar artery: Normal. --Anterior inferior cerebellar arteries: Normal. --Posterior inferior cerebellar arteries: Normal. Venous sinuses: As permitted by contrast timing, patent. Anatomic variants: Absent right anterior cerebral artery A1 segment, a normal variant. Review of the MIP images confirms the above findings CT Brain Perfusion Findings: CBF (<30%) Volume: 18mL Perfusion (Tmax>6.0s) volume: 38mL Mismatch Volume: 40mL Infarction Location:None IMPRESSION: 1. No perfusion abnormality or other evidence of acute infarct. 2. No intracranial arterial occlusion or high-grade stenosis. 3. Mild carotid bifurcation atherosclerosis without hemodynamically significant stenosis. 4. Mild aortic atherosclerosis. Electronically Signed   By:  Ulyses Jarred M.D.   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 Does the need for close, 24 hr/day medical supervision in concert with the patient's rehab needs make it unreasonable for this patient to be served in a less intensive setting? Yes 2. Co-Morbidities requiring supervision/potential complications: htn, abla, oa left knee, gout 3. Due to bladder  management, bowel management, safety, skin/wound care, disease management, medication administration, pain management and patient education, does the patient require 24 hr/day rehab nursing? Yes 4. Does the patient require coordinated care of a physician, rehab nurse, PT (1-2 hrs/day, 5 days/week), OT (1-2 hrs/day, 5 days/week) and SLP (1-2 hrs/day, 5 days/week) to address physical and functional deficits in the context of the above medical diagnosis(es)? Yes Addressing deficits in the following areas: balance, endurance, locomotion, strength, transferring, bowel/bladder control, bathing, dressing, feeding, grooming, toileting, speech, swallowing and psychosocial support 5. Can the patient actively participate in an intensive therapy program of at least 3 hrs of therapy per day at least 5 days per week? Yes 6. The potential for patient to make measurable gains while on inpatient rehab is excellent 7. Anticipated functional outcomes upon discharge from inpatient rehab are modified independent and supervision  with PT, modified independent, supervision and min assist with OT, modified independent with SLP. 8. Estimated rehab length of stay to reach the above functional goals is: 17-24 days 9. Does the patient have adequate social supports and living environment to accommodate these discharge functional goals? Yes,potentially 10. Anticipated D/C setting: Home 11. Anticipated post D/C treatments: HH therapy and Outpatient therapy 12. Overall Rehab/Functional Prognosis: excellent  RECOMMENDATIONS: This patient's condition is appropriate for continued rehabilitative care in the following setting: CIR Patient has agreed to participate in recommended program. Yes Note that insurance prior authorization may be required for reimbursement for recommended care.  Comment: Rehab Admissions Coordinator to follow up.  Thanks,  Meredith Staggers, MD, [place]ford Pillar, PA-C 12/17/2016

## 2016-12-17 NOTE — Discharge Summary (Addendum)
Physician Discharge Summary  MALYIA MORO MRN: [id number] DOB/AGE: 72-Jun-1947 62 y.o.  PCP: Elyn Peers, MD   Admit date: 12/15/2016 Discharge date: 12/17/2016  Discharge Diagnoses:    Principal Problem:   Acute ischemic stroke [facility]) Active Problems:   Hyperlipidemia   Essential hypertension, benign   Generalized weakness   2D Echo  pending   Follow-up recommendations Follow-up with PCP in 3-5 days , including all  additional recommended appointments as below Follow-up CBC, CMP in 3-5 days Monitor potassium closely Patient needs in and out cath and outpatient follow with urology for urinary retention  Dysphagia 2 (Fine chop);Nectar-thick liquid   Liquid Administration via: Cup;No straw Medication Administration: Whole meds with puree Supervision: Patient able to self feed;Full supervision/cueing for compensatory strategies Compensations: Slow rate;Small sips/bites Postural Changes: Seated upright at 90 degrees     Current Discharge Medication List    START taking these medications   Details  atorvastatin (LIPITOR) 40 MG tablet Take 1 tablet (40 mg total) by mouth daily at 6 PM. Qty: 30 tablet, Refills: 2    food thickener (THICK IT) POWD Take 1 Container by mouth as needed (nector thick liquids). Qty: 1 Can, Refills: 0    methocarbamol (ROBAXIN) 500 MG tablet Take 1 tablet (500 mg total) by mouth every 8 (eight) hours as needed for muscle spasms. Qty: 30 tablet, Refills: 0    nicotine (NICODERM CQ - DOSED IN MG/24 HR) 7 mg/24hr patch Place 1 patch (7 mg total) onto the skin daily. Qty: 28 patch, Refills: 0    oxyCODONE (OXY IR/ROXICODONE) 5 MG immediate release tablet Take 1 tablet (5 mg total) by mouth every 8 (eight) hours as needed for moderate pain. Qty: 30 tablet, Refills: 0    potassium chloride (KLOR-CON) 20 MEQ packet Take 40 mEq by mouth daily. Qty: 60 packet, Refills: 1    senna-docusate (SENOKOT-S) 8.6-50 MG tablet Take 1 tablet by mouth at  bedtime as needed for mild constipation or moderate constipation. Qty: 60 tablet, Refills: 1      CONTINUE these medications which have CHANGED   Details  aspirin EC 325 MG EC tablet Take 1 tablet (325 mg total) by mouth daily. Qty: 30 tablet, Refills: 0      CONTINUE these medications which have NOT CHANGED   Details  acetaminophen (TYLENOL) 500 MG tablet Take 1,000 mg by mouth every 6 (six) hours as needed for mild pain.    colchicine 0.6 MG tablet Take 1 tablet (0.6 mg total) by mouth daily. Qty: 1 tablet, Refills: 0    indomethacin (INDOCIN) 25 MG capsule Take 1 po TID with food x 3 d then 1 po BID x 3d then 1 po QD x 3d Qty: 18 capsule, Refills: 0      STOP taking these medications     ibuprofen (ADVIL,MOTRIN) 200 MG tablet          Discharge Condition: Stable   Discharge Instructions Get Medicines reviewed and adjusted: Please take all your medications with you for your next visit with your Primary MD  Please request your Primary MD to go over all hospital tests and procedure/radiological results at the follow up, please ask your Primary MD to get all Hospital records sent to his/her office.  If you experience worsening of your admission symptoms, develop shortness of breath, life threatening emergency, suicidal or homicidal thoughts you must seek medical attention immediately by calling 911 or calling your MD immediately if symptoms less severe.  You must  read complete instructions/literature along with all the possible adverse reactions/side effects for all the Medicines you take and that have been prescribed to you. Take any new Medicines after you have completely understood and accpet all the possible adverse reactions/side effects.   Do not drive when taking Pain medications.   Do not take more than prescribed Pain, Sleep and Anxiety Medications  Special Instructions: If you have smoked or chewed Tobacco in the last 2 yrs please stop smoking, stop any  regular Alcohol and or any Recreational drug use.  Wear Seat belts while driving.  Please note  You were cared for by a hospitalist during your hospital stay. Once you are discharged, your primary care physician will handle any further medical issues. Please note that NO REFILLS for any discharge medications will be authorized once you are discharged, as it is imperative that you return to your primary care physician (or establish a relationship with a primary care physician if you do not have one) for your aftercare needs so that they can reassess your need for medications and monitor your lab values.     Allergies  Allergen Reactions  . Food Allergy Formula Other (See Comments)    Shellfish-banana-beef-flares up her gout      Disposition:    Consults:  Neurology   Significant Diagnostic Studies:  Ct Angio Head W Or Wo Contrast  Result Date: 12/16/2016 CLINICAL DATA:  Weakness EXAM: CT PERFUSION BRAIN CT ANGIOGRAPHY HEAD CT ANGIOGRAPHY NECK TECHNIQUE: Multiphase CT imaging of the brain was performed following IV bolus contrast injection. Subsequent parametric perfusion maps were calculated using RAPID software. Multidetector CT imaging of the head and neck was performed using the standard protocol during bolus administration of intravenous contrast. Multiplanar CT image reconstructions and MIPs were obtained to evaluate the vascular anatomy. Carotid stenosis measurements (when applicable) are obtained utilizing NASCET criteria, using the distal internal carotid diameter as the denominator. CONTRAST:  90 mL Isovue 370 IV COMPARISON:  None. FINDINGS: CT HEAD FINDINGS Brain: No mass lesion, intraparenchymal hemorrhage or extra-axial collection. No evidence of acute cortical infarct. AREAS of hypoattenuation in the right basal ganglia and left frontal white matter are suggestive of chronic infarcts. Vascular: No hyperdense vessel or unexpected calcification. Skull: Normal visualized skull  base, calvarium and extracranial soft tissues. Sinuses/Orbits: Ethmoid and maxillary mild mucosal thickening with atelectatic right maxillary sinus. Normal orbits. CTA NECK FINDINGS Aortic arch: There is no aneurysm or dissection of the visualized ascending aorta or aortic arch. There is a normal variant aortic arch branching pattern with the brachiocephalic and left common carotid arteries sharing a common origin. The proximal subclavian arteries are patent. There is mild calcification of the aortic arch. Right carotid system: The right common carotid origin is widely patent. There is no common carotid or internal carotid artery dissection or aneurysm. There is mild atherosclerotic calcification at the carotid bifurcation without hemodynamically significant stenosis. Left carotid system: The left common carotid origin is widely patent. There is no common carotid or internal carotid artery dissection or aneurysm. There is mild atherosclerotic calcification at the carotid bifurcation without hemodynamically significant stenosis. Vertebral arteries: The vertebral system is mildly right-dominant. Both vertebral artery origins are normal. Both vertebral arteries are normal to their confluence with the basilar artery. Skeleton: There is no bony spinal canal stenosis. No lytic or blastic lesions. Other neck: The nasopharynx is clear. The oropharynx and hypopharynx are normal. The epiglottis is normal. The supraglottic larynx, glottis and subglottic larynx are normal. No retropharyngeal  collection. The parapharyngeal spaces are preserved. The parotid and submandibular glands are normal. No sialolithiasis or salivary ductal dilatation. The thyroid gland is normal. There is no cervical lymphadenopathy. Upper chest: No pneumothorax or pleural effusion. No nodules or masses. Review of the MIP images confirms the above findings CTA HEAD FINDINGS Anterior circulation: --Intracranial internal carotid arteries: There is  atherosclerotic calcification of the bilateral cavernous and clinoid segments without advanced stenosis. 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 Otherwise normal. --Middle cerebral arteries: Normal. --Posterior communicating arteries: Small P-comm is present on the right. Posterior circulation: --Posterior cerebral arteries: Normal. --Superior cerebellar arteries: Normal. --Basilar artery: Normal. --Anterior inferior cerebellar arteries: Normal. --Posterior inferior cerebellar arteries: Normal. Venous sinuses: As permitted by contrast timing, patent. Anatomic variants: Absent right anterior cerebral artery A1 segment, a normal variant. Review of the MIP images confirms the above findings CT Brain Perfusion Findings: CBF (<30%) Volume: 27m Perfusion (Tmax>6.0s) volume: 0774mMismatch Volume: 74m47mnfarction Location:None IMPRESSION: 1. No perfusion abnormality or other evidence of acute infarct. 2. No intracranial arterial occlusion or high-grade stenosis. 3. Mild carotid bifurcation atherosclerosis without hemodynamically significant stenosis. 4. Mild aortic atherosclerosis. Electronically Signed   By: KevUlyses JarredD.   On: 12/16/2016 01:02   Dg Chest 2 View  Result Date: 12/15/2016 CLINICAL DATA:  Productive cough, shortness of breath for 2-3 months. History of hypertension, pneumonia, smoker. EXAM: CHEST  2 VIEW COMPARISON:  Chest radiograph October 26, 2014 FINDINGS: Cardiac silhouette is normal. Calcified aortic knob. Unchanged fullness the RIGHT hilum. No pleural effusion or focal consolidation. No pneumothorax. Soft tissue planes and included osseous structures are nonsuspicious, mild degenerative change of the thoracic spine. Partially imaged lumbar hardware. IMPRESSION: No acute cardiopulmonary process, stable examination. Electronically Signed   By: CouElon AlasD.   On: 12/15/2016 22:00   Ct Head Wo Contrast  Result Date: 12/15/2016 CLINICAL  DATA:  Code stroke.  [age]  F; left-sided weakness. EXAM: CT HEAD WITHOUT CONTRAST TECHNIQUE: Contiguous axial images were obtained from the base of the skull through the vertex without intravenous contrast. COMPARISON:  07/21/2005 CT head. FINDINGS: Brain: No evidence for large territory infarct, intracranial hemorrhage, or focal mass effect. There are several small lucencies in the basal ganglia bilaterally and in the left anterior and right posterior corona radiata compatible with age-indeterminate lacunar infarcts. There are mild chronic microvascular ischemic changes and mild volume loss of the brain parenchyma Vascular: No hyperdense vessel. Moderate calcific atherosclerosis of skullbase internal carotid arteries. Skull: Normal. Negative for fracture or focal lesion. Sinuses/Orbits: Mild diffuse paranasal sinus mucosal thickening. Normally pneumatized mastoid air cells. Orbits are unremarkable. Other: None. ASPECTS ([facility] Early CT Score) - Ganglionic level infarction (caudate, lentiform nuclei, internal capsule, insula, M1-M3 cortex): 7 - Supraganglionic infarction (M4-M6 cortex): 3 Total score (0-10 with 10 being normal): 10 IMPRESSION: 1. No evidence for large territory infarct, intracranial hemorrhage, or focal mass effect. 2. Several new small lucencies from 2006 in the basal ganglia bilaterally as well as the left anterior and right posterior corona radiata are compatible with age-indeterminate lacunar infarcts. There are mild chronic microvascular ischemic changes and mild volume loss of the brain parenchyma 3. ASPECTS is 10 These results were called by telephone at the time of interpretation on 12/15/2016 at 9:19 pm to Dr. LinCheral Markero verbally acknowledged these results. Electronically Signed   By: LanKristine GarbeD.   On: 12/15/2016 21:21   Ct Angio Neck W And/or  Wo Contrast  Result Date: 12/16/2016 CLINICAL DATA:  Weakness EXAM: CT PERFUSION BRAIN CT ANGIOGRAPHY HEAD CT  ANGIOGRAPHY NECK TECHNIQUE: Multiphase CT imaging of the brain was performed following IV bolus contrast injection. Subsequent parametric perfusion maps were calculated using RAPID software. Multidetector CT imaging of the head and neck was performed using the standard protocol during bolus administration of intravenous contrast. Multiplanar CT image reconstructions and MIPs were obtained to evaluate the vascular anatomy. Carotid stenosis measurements (when applicable) are obtained utilizing NASCET criteria, using the distal internal carotid diameter as the denominator. CONTRAST:  90 mL Isovue 370 IV COMPARISON:  None. FINDINGS: CT HEAD FINDINGS Brain: No mass lesion, intraparenchymal hemorrhage or extra-axial collection. No evidence of acute cortical infarct. AREAS of hypoattenuation in the right basal ganglia and left frontal white matter are suggestive of chronic infarcts. Vascular: No hyperdense vessel or unexpected calcification. Skull: Normal visualized skull base, calvarium and extracranial soft tissues. Sinuses/Orbits: Ethmoid and maxillary mild mucosal thickening with atelectatic right maxillary sinus. Normal orbits. CTA NECK FINDINGS Aortic arch: There is no aneurysm or dissection of the visualized ascending aorta or aortic arch. There is a normal variant aortic arch branching pattern with the brachiocephalic and left common carotid arteries sharing a common origin. The proximal subclavian arteries are patent. There is mild calcification of the aortic arch. Right carotid system: The right common carotid origin is widely patent. There is no common carotid or internal carotid artery dissection or aneurysm. There is mild atherosclerotic calcification at the carotid bifurcation without hemodynamically significant stenosis. Left carotid system: The left common carotid origin is widely patent. There is no common carotid or internal carotid artery dissection or aneurysm. There is mild atherosclerotic  calcification at the carotid bifurcation without hemodynamically significant stenosis. Vertebral arteries: The vertebral system is mildly right-dominant. Both vertebral artery origins are normal. Both vertebral arteries are normal to their confluence with the basilar artery. Skeleton: There is no bony spinal canal stenosis. No lytic or blastic lesions. Other neck: The nasopharynx is clear. The oropharynx and hypopharynx are normal. The epiglottis is normal. The supraglottic larynx, glottis and subglottic larynx are normal. No retropharyngeal collection. The parapharyngeal spaces are preserved. The parotid and submandibular glands are normal. No sialolithiasis or salivary ductal dilatation. The thyroid gland is normal. There is no cervical lymphadenopathy. Upper chest: No pneumothorax or pleural effusion. No nodules or masses. Review of the MIP images confirms the above findings CTA HEAD FINDINGS Anterior circulation: --Intracranial internal carotid arteries: There is atherosclerotic calcification of the bilateral cavernous and clinoid segments without advanced stenosis. 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 Otherwise normal. --Middle cerebral arteries: Normal. --Posterior communicating arteries: Small P-comm is present on the right. Posterior circulation: --Posterior cerebral arteries: Normal. --Superior cerebellar arteries: Normal. --Basilar artery: Normal. --Anterior inferior cerebellar arteries: Normal. --Posterior inferior cerebellar arteries: Normal. Venous sinuses: As permitted by contrast timing, patent. Anatomic variants: Absent right anterior cerebral artery A1 segment, a normal variant. Review of the MIP images confirms the above findings CT Brain Perfusion Findings: CBF (<30%) Volume: 16m Perfusion (Tmax>6.0s) volume: 02mMismatch Volume: 21m41mnfarction Location:None IMPRESSION: 1. No perfusion abnormality or other evidence of acute infarct. 2. No  intracranial arterial occlusion or high-grade stenosis. 3. Mild carotid bifurcation atherosclerosis without hemodynamically significant stenosis. 4. Mild aortic atherosclerosis. Electronically Signed   By: KevUlyses JarredD.   On: 12/16/2016 01:02   Mr Brain Wo Contrast  Result Date: 12/16/2016 CLINICAL DATA:  Left-sided weakness. EXAM:  MRI HEAD WITHOUT CONTRAST TECHNIQUE: Multiplanar, multiecho pulse sequences of the brain and surrounding structures were obtained without intravenous contrast. COMPARISON:  CT head 12/16/2016. FINDINGS: The patient was not able to complete the study. Axial diffusion-weighted imaging demonstrates small area of acute infarct posterior limb internal capsule on the right. Axial diffusion is degraded by motion. No other imaging was performed. There is chronic ischemic change in the deep white matter bilaterally. No hydrocephalus. IMPRESSION: Incomplete and limited study Acute infarct posterior limb internal capsule on the right. Electronically Signed   By: Franchot Gallo M.D.   On: 12/16/2016 08:10   Ct Cerebral Perfusion W Contrast  Result Date: 12/16/2016 CLINICAL DATA:  Weakness EXAM: CT PERFUSION BRAIN CT ANGIOGRAPHY HEAD CT ANGIOGRAPHY NECK TECHNIQUE: Multiphase CT imaging of the brain was performed following IV bolus contrast injection. Subsequent parametric perfusion maps were calculated using RAPID software. Multidetector CT imaging of the head and neck was performed using the standard protocol during bolus administration of intravenous contrast. Multiplanar CT image reconstructions and MIPs were obtained to evaluate the vascular anatomy. Carotid stenosis measurements (when applicable) are obtained utilizing NASCET criteria, using the distal internal carotid diameter as the denominator. CONTRAST:  90 mL Isovue 370 IV COMPARISON:  None. FINDINGS: CT HEAD FINDINGS Brain: No mass lesion, intraparenchymal hemorrhage or extra-axial collection. No evidence of acute cortical  infarct. AREAS of hypoattenuation in the right basal ganglia and left frontal white matter are suggestive of chronic infarcts. Vascular: No hyperdense vessel or unexpected calcification. Skull: Normal visualized skull base, calvarium and extracranial soft tissues. Sinuses/Orbits: Ethmoid and maxillary mild mucosal thickening with atelectatic right maxillary sinus. Normal orbits. CTA NECK FINDINGS Aortic arch: There is no aneurysm or dissection of the visualized ascending aorta or aortic arch. There is a normal variant aortic arch branching pattern with the brachiocephalic and left common carotid arteries sharing a common origin. The proximal subclavian arteries are patent. There is mild calcification of the aortic arch. Right carotid system: The right common carotid origin is widely patent. There is no common carotid or internal carotid artery dissection or aneurysm. There is mild atherosclerotic calcification at the carotid bifurcation without hemodynamically significant stenosis. Left carotid system: The left common carotid origin is widely patent. There is no common carotid or internal carotid artery dissection or aneurysm. There is mild atherosclerotic calcification at the carotid bifurcation without hemodynamically significant stenosis. Vertebral arteries: The vertebral system is mildly right-dominant. Both vertebral artery origins are normal. Both vertebral arteries are normal to their confluence with the basilar artery. Skeleton: There is no bony spinal canal stenosis. No lytic or blastic lesions. Other neck: The nasopharynx is clear. The oropharynx and hypopharynx are normal. The epiglottis is normal. The supraglottic larynx, glottis and subglottic larynx are normal. No retropharyngeal collection. The parapharyngeal spaces are preserved. The parotid and submandibular glands are normal. No sialolithiasis or salivary ductal dilatation. The thyroid gland is normal. There is no cervical lymphadenopathy. Upper  chest: No pneumothorax or pleural effusion. No nodules or masses. Review of the MIP images confirms the above findings CTA HEAD FINDINGS Anterior circulation: --Intracranial internal carotid arteries: There is atherosclerotic calcification of the bilateral cavernous and clinoid segments without advanced stenosis. 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 Otherwise normal. --Middle cerebral arteries: Normal. --Posterior communicating arteries: Small P-comm is present on the right. Posterior circulation: --Posterior cerebral arteries: Normal. --Superior cerebellar arteries: Normal. --Basilar artery: Normal. --Anterior inferior cerebellar arteries: Normal. --Posterior inferior cerebellar arteries: Normal. Venous  sinuses: As permitted by contrast timing, patent. Anatomic variants: Absent right anterior cerebral artery A1 segment, a normal variant. Review of the MIP images confirms the above findings CT Brain Perfusion Findings: CBF (<30%) Volume: 57m Perfusion (Tmax>6.0s) volume: 036mMismatch Volume: 70m49mnfarction Location:None IMPRESSION: 1. No perfusion abnormality or other evidence of acute infarct. 2. No intracranial arterial occlusion or high-grade stenosis. 3. Mild carotid bifurcation atherosclerosis without hemodynamically significant stenosis. 4. Mild aortic atherosclerosis. Electronically Signed   By: KevUlyses JarredD.   On: 12/16/2016 01:02    2-D echo    Filed Weights   12/15/16 2124 12/15/16 2128  Weight: 104.3 kg (230 lb) 104.3 kg (230 lb)     Microbiology: No results found for this or any previous visit (from the past 240 hour(s)).     Blood Culture    Component Value Date/Time   SDES BLOOD LEFT ARM 08/31/2014 1715   SPECREQUEST BOTTLES DRAWN AEROBIC ONLY 4CC 08/31/2014 1715   CULT  08/31/2014 1715    NO GROWTH 5 DAYS Performed at [facility]/01/2014 FINAL 08/31/2014 1715      Labs: Results for orders  placed or performed during the hospital encounter of 12/15/16 (from the past 48 hour(s))  CBG monitoring, ED     Status: Abnormal   Collection Time: 12/15/16  9:01 PM  Result Value Ref Range   Glucose-Capillary 103 (H) 65 - 99 mg/dL   Comment 1 Notify RN    Comment 2 Document in Chart   Ethanol     Status: None   Collection Time: 12/15/16  9:03 PM  Result Value Ref Range   Alcohol, Ethyl (B) <5 <5 mg/dL    Comment:        LOWEST DETECTABLE LIMIT FOR SERUM ALCOHOL IS 5 mg/dL FOR MEDICAL PURPOSES ONLY   Protime-INR     Status: None   Collection Time: 12/15/16  9:03 PM  Result Value Ref Range   Prothrombin Time 13.3 11.4 - 15.2 seconds   INR 1.01   APTT     Status: Abnormal   Collection Time: 12/15/16  9:03 PM  Result Value Ref Range   aPTT 37 (H) 24 - 36 seconds    Comment:        IF BASELINE aPTT IS ELEVATED, SUGGEST PATIENT RISK ASSESSMENT BE USED TO DETERMINE APPROPRIATE ANTICOAGULANT THERAPY.   CBC     Status: Abnormal   Collection Time: 12/15/16  9:03 PM  Result Value Ref Range   WBC 11.0 (H) 4.0 - 10.5 K/uL   RBC 4.34 3.87 - 5.11 MIL/uL   Hemoglobin 12.1 12.0 - 15.0 g/dL   HCT 35.8 (L) 36.0 - 46.0 %   MCV 82.5 78.0 - 100.0 fL   MCH 27.9 26.0 - 34.0 pg   MCHC 33.8 30.0 - 36.0 g/dL   RDW 14.6 11.5 - 15.5 %   Platelets 227 150 - 400 K/uL  Differential     Status: Abnormal   Collection Time: 12/15/16  9:03 PM  Result Value Ref Range   Neutrophils Relative % 34 %   Lymphocytes Relative 58 %   Monocytes Relative 6 %   Eosinophils Relative 2 %   Basophils Relative 0 %   Neutro Abs 3.7 1.7 - 7.7 K/uL   Lymphs Abs 6.4 (H) 0.7 - 4.0 K/uL   Monocytes Absolute 0.7 0.1 - 1.0 K/uL   Eosinophils Absolute 0.2 0.0 - 0.7 K/uL   Basophils Absolute 0.0 0.0 -  0.1 K/uL   WBC Morphology ATYPICAL LYMPHOCYTES   Comprehensive metabolic panel     Status: Abnormal   Collection Time: 12/15/16  9:03 PM  Result Value Ref Range   Sodium 146 (H) 135 - 145 mmol/L   Potassium 2.9 (L)  3.5 - 5.1 mmol/L   Chloride 107 101 - 111 mmol/L   CO2 27 22 - 32 mmol/L   Glucose, Bld 105 (H) 65 - 99 mg/dL   BUN 9 6 - 20 mg/dL   Creatinine, Ser 0.79 0.44 - 1.00 mg/dL   Calcium 9.1 8.9 - 10.3 mg/dL   Total Protein 6.8 6.5 - 8.1 g/dL   Albumin 3.4 (L) 3.5 - 5.0 g/dL   AST 21 15 - 41 U/L   ALT 15 14 - 54 U/L   Alkaline Phosphatase 110 38 - 126 U/L   Total Bilirubin 0.3 0.3 - 1.2 mg/dL   GFR calc non Af Amer >60 >60 mL/min   GFR calc Af Amer >60 >60 mL/min    Comment: (NOTE) The eGFR has been calculated using the CKD EPI equation. This calculation has not been validated in all clinical situations. eGFR's persistently <60 mL/min signify possible Chronic Kidney Disease.    Anion gap 12 5 - 15  Pathologist smear review     Status: None   Collection Time: 12/15/16  9:03 PM  Result Value Ref Range   Path Review            Comment: Absolute lymphocytosis. Suggest immunophenotyping if a new, persistent finding.        Reviewed by Kalman Drape, M.D. 12/16/2016   I-stat troponin, ED (not at [facility], [facility])     Status: None   Collection Time: 12/15/16  9:09 PM  Result Value Ref Range   Troponin i, poc 0.00 0.00 - 0.08 ng/mL   Comment 3            Comment: Due to the release kinetics of cTnI, a negative result within the first hours of the onset of symptoms does not rule out myocardial infarction with certainty. If myocardial infarction is still suspected, repeat the test at appropriate intervals.   I-Stat Chem 8, ED  (not at [facility], [facility])     Status: Abnormal   Collection Time: 12/15/16  9:11 PM  Result Value Ref Range   Sodium 145 135 - 145 mmol/L   Potassium 2.8 (L) 3.5 - 5.1 mmol/L   Chloride 106 101 - 111 mmol/L   BUN 10 6 - 20 mg/dL   Creatinine, Ser 0.80 0.44 - 1.00 mg/dL   Glucose, Bld 107 (H) 65 - 99 mg/dL   Calcium, Ion 1.08 (L) 1.15 - 1.40 mmol/L   TCO2 28 0 - 100 mmol/L   Hemoglobin 12.2 12.0 - 15.0 g/dL   HCT 36.0 36.0 - 46.0 %  Urine rapid drug screen (hosp  performed)not at [facility]     Status: None   Collection Time: 12/15/16  9:43 PM  Result Value Ref Range   Opiates NONE DETECTED NONE DETECTED   Cocaine NONE DETECTED NONE DETECTED   Benzodiazepines NONE DETECTED NONE DETECTED   Amphetamines NONE DETECTED NONE DETECTED   Tetrahydrocannabinol NONE DETECTED NONE DETECTED   Barbiturates NONE DETECTED NONE DETECTED    Comment:        DRUG SCREEN FOR MEDICAL PURPOSES ONLY.  IF CONFIRMATION IS NEEDED FOR ANY PURPOSE, NOTIFY LAB WITHIN 5 DAYS.        LOWEST DETECTABLE LIMITS FOR URINE  DRUG SCREEN Drug Class       Cutoff (ng/mL) Amphetamine      1000 Barbiturate      200 Benzodiazepine   812 Tricyclics       751 Opiates          300 Cocaine          300 THC              50   Urinalysis, Routine w reflex microscopic     Status: Abnormal   Collection Time: 12/15/16  9:43 PM  Result Value Ref Range   Color, Urine STRAW (A) YELLOW   APPearance CLEAR CLEAR   Specific Gravity, Urine 1.010 1.005 - 1.030   pH 7.0 5.0 - 8.0   Glucose, UA NEGATIVE NEGATIVE mg/dL   Hgb urine dipstick NEGATIVE NEGATIVE   Bilirubin Urine NEGATIVE NEGATIVE   Ketones, ur NEGATIVE NEGATIVE mg/dL   Protein, ur NEGATIVE NEGATIVE mg/dL   Nitrite NEGATIVE NEGATIVE   Leukocytes, UA NEGATIVE NEGATIVE  Influenza panel by PCR (type A & B, H1N1)     Status: None   Collection Time: 12/15/16  9:49 PM  Result Value Ref Range   Influenza A By PCR NEGATIVE NEGATIVE   Influenza B By PCR NEGATIVE NEGATIVE    Comment: (NOTE) The Xpert Xpress Flu assay is intended as an aid in the diagnosis of  influenza and should not be used as a sole basis for treatment.  This  assay is FDA approved for nasopharyngeal swab specimens only. Nasal  washings and aspirates are unacceptable for Xpert Xpress Flu testing.   Hemoglobin A1c     Status: None   Collection Time: 12/16/16  8:07 AM  Result Value Ref Range   Hgb A1c MFr Bld 5.4 4.8 - 5.6 %    Comment: (NOTE)         Pre-diabetes: 5.7 -  6.4         Diabetes: >6.4         Glycemic control for adults with diabetes: <7.0    Mean Plasma Glucose 108 mg/dL    Comment: (NOTE) Performed At: [facility] [place], [place] [id number] Lindon Romp MD [id number]   Lipid panel     Status: Abnormal   Collection Time: 12/16/16  8:07 AM  Result Value Ref Range   Cholesterol 224 (H) 0 - 200 mg/dL   Triglycerides 61 <150 mg/dL   HDL 65 >40 mg/dL   Total CHOL/HDL Ratio 3.4 RATIO   VLDL 12 0 - 40 mg/dL   LDL Cholesterol 147 (H) 0 - 99 mg/dL    Comment:        Total Cholesterol/HDL:CHD Risk Coronary Heart Disease Risk Table                     Men   Women  1/2 Average Risk   3.4   3.3  Average Risk       5.0   4.4  2 X Average Risk   9.6   7.1  3 X Average Risk  23.4   11.0        Use the calculated Patient Ratio above and the CHD Risk Table to determine the patient's CHD Risk.        ATP III CLASSIFICATION (LDL):  <100     mg/dL   Optimal  100-129  mg/dL   Near or Above  Optimal  130-159  mg/dL   Borderline  160-189  mg/dL   High  >190     mg/dL   Very High   Comprehensive metabolic panel     Status: Abnormal   Collection Time: 12/16/16  8:07 AM  Result Value Ref Range   Sodium 145 135 - 145 mmol/L   Potassium 2.9 (L) 3.5 - 5.1 mmol/L   Chloride 109 101 - 111 mmol/L   CO2 29 22 - 32 mmol/L   Glucose, Bld 101 (H) 65 - 99 mg/dL   BUN 5 (L) 6 - 20 mg/dL   Creatinine, Ser 0.62 0.44 - 1.00 mg/dL   Calcium 9.2 8.9 - 10.3 mg/dL   Total Protein 7.3 6.5 - 8.1 g/dL   Albumin 3.4 (L) 3.5 - 5.0 g/dL   AST 20 15 - 41 U/L   ALT 15 14 - 54 U/L   Alkaline Phosphatase 115 38 - 126 U/L   Total Bilirubin 0.7 0.3 - 1.2 mg/dL   GFR calc non Af Amer >60 >60 mL/min   GFR calc Af Amer >60 >60 mL/min    Comment: (NOTE) The eGFR has been calculated using the CKD EPI equation. This calculation has not been validated in all clinical situations. eGFR's persistently <60 mL/min signify  possible Chronic Kidney Disease.    Anion gap 7 5 - 15  Magnesium     Status: None   Collection Time: 12/16/16  8:07 AM  Result Value Ref Range   Magnesium 1.8 1.7 - 2.4 mg/dL  Basic metabolic panel     Status: None   Collection Time: 12/16/16  7:29 PM  Result Value Ref Range   Sodium 141 135 - 145 mmol/L   Potassium 3.5 3.5 - 5.1 mmol/L    Comment: DELTA CHECK NOTED   Chloride 105 101 - 111 mmol/L   CO2 28 22 - 32 mmol/L   Glucose, Bld 98 65 - 99 mg/dL   BUN 6 6 - 20 mg/dL   Creatinine, Ser 0.62 0.44 - 1.00 mg/dL   Calcium 9.4 8.9 - 10.3 mg/dL   GFR calc non Af Amer >60 >60 mL/min   GFR calc Af Amer >60 >60 mL/min    Comment: (NOTE) The eGFR has been calculated using the CKD EPI equation. This calculation has not been validated in all clinical situations. eGFR's persistently <60 mL/min signify possible Chronic Kidney Disease.    Anion gap 8 5 - 15  CBC     Status: None   Collection Time: 12/17/16  6:36 AM  Result Value Ref Range   WBC 9.0 4.0 - 10.5 K/uL   RBC 4.69 3.87 - 5.11 MIL/uL   Hemoglobin 13.0 12.0 - 15.0 g/dL   HCT 38.8 36.0 - 46.0 %   MCV 82.7 78.0 - 100.0 fL   MCH 27.7 26.0 - 34.0 pg   MCHC 33.5 30.0 - 36.0 g/dL   RDW 14.6 11.5 - 15.5 %   Platelets 253 150 - 400 K/uL  Comprehensive metabolic panel     Status: Abnormal   Collection Time: 12/17/16  6:36 AM  Result Value Ref Range   Sodium 143 135 - 145 mmol/L   Potassium 3.3 (L) 3.5 - 5.1 mmol/L   Chloride 108 101 - 111 mmol/L   CO2 26 22 - 32 mmol/L   Glucose, Bld 106 (H) 65 - 99 mg/dL   BUN 6 6 - 20 mg/dL   Creatinine, Ser 0.65 0.44 - 1.00 mg/dL   Calcium  9.3 8.9 - 10.3 mg/dL   Total Protein 7.1 6.5 - 8.1 g/dL   Albumin 3.3 (L) 3.5 - 5.0 g/dL   AST 21 15 - 41 U/L   ALT 15 14 - 54 U/L   Alkaline Phosphatase 110 38 - 126 U/L   Total Bilirubin 0.8 0.3 - 1.2 mg/dL   GFR calc non Af Amer >60 >60 mL/min   GFR calc Af Amer >60 >60 mL/min    Comment: (NOTE) The eGFR has been calculated using the CKD  EPI equation. This calculation has not been validated in all clinical situations. eGFR's persistently <60 mL/min signify possible Chronic Kidney Disease.    Anion gap 9 5 - 15     Lipid Panel     Component Value Date/Time   CHOL 224 (H) 12/16/2016 0807   TRIG 61 12/16/2016 0807   HDL 65 12/16/2016 0807   CHOLHDL 3.4 12/16/2016 0807   VLDL 12 12/16/2016 0807   LDLCALC 147 (H) 12/16/2016 0807     Lab Results  Component Value Date   HGBA1C 5.4 12/16/2016      HPI :   Ellen Henry an 71 y.o.female with a history of hypertension, dyslipidemia who presented for acute onset of stuttering TIA symptoms starting at 3 PM today. She was at home in usual state of health when she suddenly became diffusely weak. She denies that she had lateralized weakness. The diffuse weakness made it difficult for her to get back into bed to rest. Her symptoms resolved but then recurred; she called EMS who noted left facial droop and left facial weakness, which had resolved per ED staff at time of presentation to [facility]. MRI positive for acute CVA. NIHSS:1.  Patient was not administered IV t-PA secondary to Due to being out of IV tPA time window.   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 Therapy recommendations:  CIR VS SNF     Chronic back pain-was taking indomethacin, oxycodone, ibuprofen Discharged on oxycodone and Robaxin   Hypertension-permissive hypertension Eventually normalize in the next 5-7  days Could start low-dose ACE inhibitor  Hypokalemia-repleted aggressively  Peripheral neuropathy/lumbar radiculopathy-continue Robaxin, without evidence of cord compression  Sleep apnea-does not use CPAP  Nicotine dependence-continue nicotine patch  Discharge Exam:   Blood pressure (!) 158/87, pulse 82, temperature 98.3 F (36.8 C), temperature source Oral, resp. rate 20, height '5\' 8"'  (1.727 m), weight 104.3 kg (230 lb), SpO2 97 %.  Cardiovascular: Regular rate and rhythm, no murmurs / rubs / gallops. No extremity edema. 2+ pedal pulses. No carotid bruits.  Abdomen: no tenderness, no masses palpated. No hepatosplenomegaly. Bowel sounds positive.  Musculoskeletal: no clubbing / cyanosis. No joint deformity upper and lower extremities. Good ROM, no contractures. Normal muscle tone.  Skin: no rashes, lesions, ulcers. No induration Ment: Cognition and speech unchanged.  CN: Left facial droop seen, new since prior exam.  Motor: LUE 2-5. LLE 2/5. RUE and RLE  4+/5. Left sided findings are new since prior exam.   Follow-up Information    Elyn Peers, MD. Schedule an appointment as soon as possible for a visit.   Specialty:  Family Medicine Why:  hospital follow-up Contact information: [address] [place] [address] [phone]           Signed: Reyne Dumas 12/17/2016, 9:13 AM        Time spent >45 mins

## 2016-12-17 NOTE — Progress Notes (Signed)
STROKE TEAM PROGRESS NOTE   HISTORY OF PRESENT ILLNESS (per record) REGENE EAPEN is an 71 y.o. female who presented for acute onset of stuttering TIA symptoms starting at 3 PM today 09/14/2017. She was at home in usual state of health when she suddenly became diffusely weak. She denies that she had lateralized weakness. The diffuse weakness made it difficult for her to get back into bed to rest. Her symptoms resolved but then recurred; she called EMS who noted left facial droop and left facial weakness, which had resolved per ED staff at time of presentation to [facility]. NIHSS: 1.  Patient was not administered IV t-PA secondary to Due to being out of IV tPA time window.   Called back to ED for reactivation of Code Stroke at 22:45. Nurse was performing swallow screen, the patient started yawning, eyes started watering, left arm became flaccid and left facial droop was noted. LLE also with recurrent weakness.   Neurological Exam:  Ment: Cognition and speech unchanged.  CN: Left facial droop seen, new since prior exam.  Motor: LUE 2-5. LLE 2/5. RUE and RLE 4+/5. Left sided findings are new since prior exam.  A/R: 1. Reactivated Code Stroke for recurrent left sided weakness and facial droop.  2. Exam and symptoms best localize to the right MCA territory. 3. STAT follow up CT with CTA and CT perfusion ordered.   CTA H&N, Perfusion - no acute infarct or perfusion abnormality.  Patient was not found to be an endovascular candidate. She was admitted for further evaluation and treatment.   SUBJECTIVE (INTERVAL HISTORY) Her family is at the bedside.  Overall she feels her condition is Stable. Patient had echocardiogram this morning but results are pending. OBJECTIVE Temp:  [97.9 F (36.6 C)-98.5 F (36.9 C)] 98.3 F (36.8 C) (01/11 1416) Pulse Rate:  [76-101] 101 (01/11 1416) Cardiac Rhythm: Normal sinus rhythm (01/11 0703) Resp:  [18-20] 20 (01/11 1416) BP: (129-171)/(72-93) 147/93 (01/11  1416) SpO2:  [95 %-99 %] 98 % (01/11 1416)  CBC:   Recent Labs Lab 12/15/16 2103 12/15/16 2111 12/17/16 0636  WBC 11.0*  --  9.0  NEUTROABS 3.7  --   --   HGB 12.1 12.2 13.0  HCT 35.8* 36.0 38.8  MCV 82.5  --  82.7  PLT 227  --  123456    Basic Metabolic Panel:   Recent Labs Lab 12/16/16 0807 12/16/16 1929 12/17/16 0636  NA 145 141 143  K 2.9* 3.5 3.3*  CL 109 105 108  CO2 29 28 26   GLUCOSE 101* 98 106*  BUN 5* 6 6  CREATININE 0.62 0.62 0.65  CALCIUM 9.2 9.4 9.3  MG 1.8  --   --     Lipid Panel:     Component Value Date/Time   CHOL 224 (H) 12/16/2016 0807   TRIG 61 12/16/2016 0807   HDL 65 12/16/2016 0807   CHOLHDL 3.4 12/16/2016 0807   VLDL 12 12/16/2016 0807   LDLCALC 147 (H) 12/16/2016 0807   HgbA1c:  Lab Results  Component Value Date   HGBA1C 5.4 12/16/2016   Urine Drug Screen:     Component Value Date/Time   LABOPIA NONE DETECTED 12/15/2016 2143   COCAINSCRNUR NONE DETECTED 12/15/2016 2143   LABBENZ NONE DETECTED 12/15/2016 2143   AMPHETMU NONE DETECTED 12/15/2016 2143   THCU NONE DETECTED 12/15/2016 2143   LABBARB NONE DETECTED 12/15/2016 2143      IMAGING  Ct Angio Head W Or Wo Contrast  Result  Date: 12/16/2016 CLINICAL DATA:  Weakness EXAM: CT PERFUSION BRAIN CT ANGIOGRAPHY HEAD CT ANGIOGRAPHY NECK TECHNIQUE: Multiphase CT imaging of the brain was performed following IV bolus contrast injection. Subsequent parametric perfusion maps were calculated using RAPID software. Multidetector CT imaging of the head and neck was performed using the standard protocol during bolus administration of intravenous contrast. Multiplanar CT image reconstructions and MIPs were obtained to evaluate the vascular anatomy. Carotid stenosis measurements (when applicable) are obtained utilizing NASCET criteria, using the distal internal carotid diameter as the denominator. CONTRAST:  90 mL Isovue 370 IV COMPARISON:  None. FINDINGS: CT HEAD FINDINGS Brain: No mass lesion,  intraparenchymal hemorrhage or extra-axial collection. No evidence of acute cortical infarct. AREAS of hypoattenuation in the right basal ganglia and left frontal white matter are suggestive of chronic infarcts. Vascular: No hyperdense vessel or unexpected calcification. Skull: Normal visualized skull base, calvarium and extracranial soft tissues. Sinuses/Orbits: Ethmoid and maxillary mild mucosal thickening with atelectatic right maxillary sinus. Normal orbits. CTA NECK FINDINGS Aortic arch: There is no aneurysm or dissection of the visualized ascending aorta or aortic arch. There is a normal variant aortic arch branching pattern with the brachiocephalic and left common carotid arteries sharing a common origin. The proximal subclavian arteries are patent. There is mild calcification of the aortic arch. Right carotid system: The right common carotid origin is widely patent. There is no common carotid or internal carotid artery dissection or aneurysm. There is mild atherosclerotic calcification at the carotid bifurcation without hemodynamically significant stenosis. Left carotid system: The left common carotid origin is widely patent. There is no common carotid or internal carotid artery dissection or aneurysm. There is mild atherosclerotic calcification at the carotid bifurcation without hemodynamically significant stenosis. Vertebral arteries: The vertebral system is mildly right-dominant. Both vertebral artery origins are normal. Both vertebral arteries are normal to their confluence with the basilar artery. Skeleton: There is no bony spinal canal stenosis. No lytic or blastic lesions. Other neck: The nasopharynx is clear. The oropharynx and hypopharynx are normal. The epiglottis is normal. The supraglottic larynx, glottis and subglottic larynx are normal. No retropharyngeal collection. The parapharyngeal spaces are preserved. The parotid and submandibular glands are normal. No sialolithiasis or salivary ductal  dilatation. The thyroid gland is normal. There is no cervical lymphadenopathy. Upper chest: No pneumothorax or pleural effusion. No nodules or masses. Review of the MIP images confirms the above findings CTA HEAD FINDINGS Anterior circulation: --Intracranial internal carotid arteries: There is atherosclerotic calcification of the bilateral cavernous and clinoid segments without advanced stenosis. 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 Otherwise normal. --Middle cerebral arteries: Normal. --Posterior communicating arteries: Small P-comm is present on the right. Posterior circulation: --Posterior cerebral arteries: Normal. --Superior cerebellar arteries: Normal. --Basilar artery: Normal. --Anterior inferior cerebellar arteries: Normal. --Posterior inferior cerebellar arteries: Normal. Venous sinuses: As permitted by contrast timing, patent. Anatomic variants: Absent right anterior cerebral artery A1 segment, a normal variant. Review of the MIP images confirms the above findings CT Brain Perfusion Findings: CBF (<30%) Volume: 71mL Perfusion (Tmax>6.0s) volume: 52mL Mismatch Volume: 63mL Infarction Location:None IMPRESSION: 1. No perfusion abnormality or other evidence of acute infarct. 2. No intracranial arterial occlusion or high-grade stenosis. 3. Mild carotid bifurcation atherosclerosis without hemodynamically significant stenosis. 4. Mild aortic atherosclerosis. Electronically Signed   By: Ulyses Jarred M.D.   On: 12/16/2016 01:02   Dg Chest 2 View  Result Date: 12/15/2016 CLINICAL DATA:  Productive cough, shortness of breath for 2-3 months.  History of hypertension, pneumonia, smoker. EXAM: CHEST  2 VIEW COMPARISON:  Chest radiograph October 26, 2014 FINDINGS: Cardiac silhouette is normal. Calcified aortic knob. Unchanged fullness the RIGHT hilum. No pleural effusion or focal consolidation. No pneumothorax. Soft tissue planes and included osseous structures are  nonsuspicious, mild degenerative change of the thoracic spine. Partially imaged lumbar hardware. IMPRESSION: No acute cardiopulmonary process, stable examination. Electronically Signed   By: Elon Alas M.D.   On: 12/15/2016 22:00   Ct Head Wo Contrast  Result Date: 12/15/2016 CLINICAL DATA:  Code stroke.  71 y/o  F; left-sided weakness. EXAM: CT HEAD WITHOUT CONTRAST TECHNIQUE: Contiguous axial images were obtained from the base of the skull through the vertex without intravenous contrast. COMPARISON:  07/21/2005 CT head. FINDINGS: Brain: No evidence for large territory infarct, intracranial hemorrhage, or focal mass effect. There are several small lucencies in the basal ganglia bilaterally and in the left anterior and right posterior corona radiata compatible with age-indeterminate lacunar infarcts. There are mild chronic microvascular ischemic changes and mild volume loss of the brain parenchyma Vascular: No hyperdense vessel. Moderate calcific atherosclerosis of skullbase internal carotid arteries. Skull: Normal. Negative for fracture or focal lesion. Sinuses/Orbits: Mild diffuse paranasal sinus mucosal thickening. Normally pneumatized mastoid air cells. Orbits are unremarkable. Other: None. ASPECTS [facility] Stroke Program Early CT Score) - Ganglionic level infarction (caudate, lentiform nuclei, internal capsule, insula, M1-M3 cortex): 7 - Supraganglionic infarction (M4-M6 cortex): 3 Total score (0-10 with 10 being normal): 10 IMPRESSION: 1. No evidence for large territory infarct, intracranial hemorrhage, or focal mass effect. 2. Several new small lucencies from 2006 in the basal ganglia bilaterally as well as the left anterior and right posterior corona radiata are compatible with age-indeterminate lacunar infarcts. There are mild chronic microvascular ischemic changes and mild volume loss of the brain parenchyma 3. ASPECTS is 10 These results were called by telephone at the time of interpretation on  12/15/2016 at 9:19 pm to Dr. Cheral Marker who verbally acknowledged these results. Electronically Signed   By: Kristine Garbe M.D.   On: 12/15/2016 21:21   Ct Angio Neck W And/or Wo Contrast  Result Date: 12/16/2016 CLINICAL DATA:  Weakness EXAM: CT PERFUSION BRAIN CT ANGIOGRAPHY HEAD CT ANGIOGRAPHY NECK TECHNIQUE: Multiphase CT imaging of the brain was performed following IV bolus contrast injection. Subsequent parametric perfusion maps were calculated using RAPID software. Multidetector CT imaging of the head and neck was performed using the standard protocol during bolus administration of intravenous contrast. Multiplanar CT image reconstructions and MIPs were obtained to evaluate the vascular anatomy. Carotid stenosis measurements (when applicable) are obtained utilizing NASCET criteria, using the distal internal carotid diameter as the denominator. CONTRAST:  90 mL Isovue 370 IV COMPARISON:  None. FINDINGS: CT HEAD FINDINGS Brain: No mass lesion, intraparenchymal hemorrhage or extra-axial collection. No evidence of acute cortical infarct. AREAS of hypoattenuation in the right basal ganglia and left frontal white matter are suggestive of chronic infarcts. Vascular: No hyperdense vessel or unexpected calcification. Skull: Normal visualized skull base, calvarium and extracranial soft tissues. Sinuses/Orbits: Ethmoid and maxillary mild mucosal thickening with atelectatic right maxillary sinus. Normal orbits. CTA NECK FINDINGS Aortic arch: There is no aneurysm or dissection of the visualized ascending aorta or aortic arch. There is a normal variant aortic arch branching pattern with the brachiocephalic and left common carotid arteries sharing a common origin. The proximal subclavian arteries are patent. There is mild calcification of the aortic arch. Right carotid system: The right common carotid origin is widely  patent. There is no common carotid or internal carotid artery dissection or aneurysm. There is  mild atherosclerotic calcification at the carotid bifurcation without hemodynamically significant stenosis. Left carotid system: The left common carotid origin is widely patent. There is no common carotid or internal carotid artery dissection or aneurysm. There is mild atherosclerotic calcification at the carotid bifurcation without hemodynamically significant stenosis. Vertebral arteries: The vertebral system is mildly right-dominant. Both vertebral artery origins are normal. Both vertebral arteries are normal to their confluence with the basilar artery. Skeleton: There is no bony spinal canal stenosis. No lytic or blastic lesions. Other neck: The nasopharynx is clear. The oropharynx and hypopharynx are normal. The epiglottis is normal. The supraglottic larynx, glottis and subglottic larynx are normal. No retropharyngeal collection. The parapharyngeal spaces are preserved. The parotid and submandibular glands are normal. No sialolithiasis or salivary ductal dilatation. The thyroid gland is normal. There is no cervical lymphadenopathy. Upper chest: No pneumothorax or pleural effusion. No nodules or masses. Review of the MIP images confirms the above findings CTA HEAD FINDINGS Anterior circulation: --Intracranial internal carotid arteries: There is atherosclerotic calcification of the bilateral cavernous and clinoid segments without advanced stenosis. 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 Otherwise normal. --Middle cerebral arteries: Normal. --Posterior communicating arteries: Small P-comm is present on the right. Posterior circulation: --Posterior cerebral arteries: Normal. --Superior cerebellar arteries: Normal. --Basilar artery: Normal. --Anterior inferior cerebellar arteries: Normal. --Posterior inferior cerebellar arteries: Normal. Venous sinuses: As permitted by contrast timing, patent. Anatomic variants: Absent right anterior cerebral artery A1 segment, a  normal variant. Review of the MIP images confirms the above findings CT Brain Perfusion Findings: CBF (<30%) Volume: 27mL Perfusion (Tmax>6.0s) volume: 46mL Mismatch Volume: 91mL Infarction Location:None IMPRESSION: 1. No perfusion abnormality or other evidence of acute infarct. 2. No intracranial arterial occlusion or high-grade stenosis. 3. Mild carotid bifurcation atherosclerosis without hemodynamically significant stenosis. 4. Mild aortic atherosclerosis. Electronically Signed   By: Ulyses Jarred M.D.   On: 12/16/2016 01:02   Mr Brain Wo Contrast  Result Date: 12/16/2016 CLINICAL DATA:  Left-sided weakness. EXAM: MRI HEAD WITHOUT CONTRAST TECHNIQUE: Multiplanar, multiecho pulse sequences of the brain and surrounding structures were obtained without intravenous contrast. COMPARISON:  CT head 12/16/2016. FINDINGS: The patient was not able to complete the study. Axial diffusion-weighted imaging demonstrates small area of acute infarct posterior limb internal capsule on the right. Axial diffusion is degraded by motion. No other imaging was performed. There is chronic ischemic change in the deep white matter bilaterally. No hydrocephalus. IMPRESSION: Incomplete and limited study Acute infarct posterior limb internal capsule on the right. Electronically Signed   By: Franchot Gallo M.D.   On: 12/16/2016 08:10   Ct Cerebral Perfusion W Contrast  Result Date: 12/16/2016 CLINICAL DATA:  Weakness EXAM: CT PERFUSION BRAIN CT ANGIOGRAPHY HEAD CT ANGIOGRAPHY NECK TECHNIQUE: Multiphase CT imaging of the brain was performed following IV bolus contrast injection. Subsequent parametric perfusion maps were calculated using RAPID software. Multidetector CT imaging of the head and neck was performed using the standard protocol during bolus administration of intravenous contrast. Multiplanar CT image reconstructions and MIPs were obtained to evaluate the vascular anatomy. Carotid stenosis measurements (when applicable) are  obtained utilizing NASCET criteria, using the distal internal carotid diameter as the denominator. CONTRAST:  90 mL Isovue 370 IV COMPARISON:  None. FINDINGS: CT HEAD FINDINGS Brain: No mass lesion, intraparenchymal hemorrhage or extra-axial collection. No evidence of acute cortical infarct. AREAS of hypoattenuation in the  right basal ganglia and left frontal white matter are suggestive of chronic infarcts. Vascular: No hyperdense vessel or unexpected calcification. Skull: Normal visualized skull base, calvarium and extracranial soft tissues. Sinuses/Orbits: Ethmoid and maxillary mild mucosal thickening with atelectatic right maxillary sinus. Normal orbits. CTA NECK FINDINGS Aortic arch: There is no aneurysm or dissection of the visualized ascending aorta or aortic arch. There is a normal variant aortic arch branching pattern with the brachiocephalic and left common carotid arteries sharing a common origin. The proximal subclavian arteries are patent. There is mild calcification of the aortic arch. Right carotid system: The right common carotid origin is widely patent. There is no common carotid or internal carotid artery dissection or aneurysm. There is mild atherosclerotic calcification at the carotid bifurcation without hemodynamically significant stenosis. Left carotid system: The left common carotid origin is widely patent. There is no common carotid or internal carotid artery dissection or aneurysm. There is mild atherosclerotic calcification at the carotid bifurcation without hemodynamically significant stenosis. Vertebral arteries: The vertebral system is mildly right-dominant. Both vertebral artery origins are normal. Both vertebral arteries are normal to their confluence with the basilar artery. Skeleton: There is no bony spinal canal stenosis. No lytic or blastic lesions. Other neck: The nasopharynx is clear. The oropharynx and hypopharynx are normal. The epiglottis is normal. The supraglottic larynx,  glottis and subglottic larynx are normal. No retropharyngeal collection. The parapharyngeal spaces are preserved. The parotid and submandibular glands are normal. No sialolithiasis or salivary ductal dilatation. The thyroid gland is normal. There is no cervical lymphadenopathy. Upper chest: No pneumothorax or pleural effusion. No nodules or masses. Review of the MIP images confirms the above findings CTA HEAD FINDINGS Anterior circulation: --Intracranial internal carotid arteries: There is atherosclerotic calcification of the bilateral cavernous and clinoid segments without advanced stenosis. 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 Otherwise normal. --Middle cerebral arteries: Normal. --Posterior communicating arteries: Small P-comm is present on the right. Posterior circulation: --Posterior cerebral arteries: Normal. --Superior cerebellar arteries: Normal. --Basilar artery: Normal. --Anterior inferior cerebellar arteries: Normal. --Posterior inferior cerebellar arteries: Normal. Venous sinuses: As permitted by contrast timing, patent. Anatomic variants: Absent right anterior cerebral artery A1 segment, a normal variant. Review of the MIP images confirms the above findings CT Brain Perfusion Findings: CBF (<30%) Volume: 69mL Perfusion (Tmax>6.0s) volume: 43mL Mismatch Volume: 27mL Infarction Location:None IMPRESSION: 1. No perfusion abnormality or other evidence of acute infarct. 2. No intracranial arterial occlusion or high-grade stenosis. 3. Mild carotid bifurcation atherosclerosis without hemodynamically significant stenosis. 4. Mild aortic atherosclerosis. Electronically Signed   By: Ulyses Jarred M.D.   On: 12/16/2016 01:02    PHYSICAL EXAM Pleasant elderly african american lady not in distress. . Afebrile. Head is nontraumatic. Neck is supple without bruit.    Cardiac exam no murmur or gallop. Lungs are clear to auscultation. Distal pulses are well  felt. Neurological Exam :  Awake alert oriented x 3. Mild dysarthria and left lower face weakness.eye movements are full range without nystagmus.no field cut. Acuity adequate. Fundi not visualized. Left hemiplegia with grade 2/5 strength in the left upper extremity with drift and 3/5 left lower extremity strength. Diminished left hemibody sensation. Left plantar upgoing left right downgoing. Normal strength in the right. Gait not tested. ASSESSMENT/PLAN Ms. LAILIE CHOPLIN is a 71 y.o. female with history of HTN, HLD and back surgery presenting with L facial who developed L hemiparesis in the hospital. She did not receive IV t-PA due to being  out of the window.   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 Yes; Fluid consistency: Thin Diet - low sodium heart healthy  No antithrombotic (pt confirms she does not take asa 81, even though med rec has it listed - she does take ibuprofen, she was advised to stop) prior to admission, now on aspirin 325 mg daily. Continue at discharge  Patient counseled to be compliant with her antithrombotic medications  Ongoing aggressive stroke risk factor management  Therapy recommendations:  CIR. Consult placed Disposition: CLR Hypertension  Stable  Permissive hypertension (OK if < 220/120) but gradually normalize in 5-7 days  Long-term BP goal normotensive  Hyperlipidemia  Home meds:  No statin  LDL 147, goal < 70  Now on lipitor 40  Continue statin at discharge  Other Stroke Risk Factors  Advanced age  Cigarette smoker, advised to stop smoking  Obesity, Body mass index is 34.97 kg/m., recommend weight loss, diet and exercise  as appropriate   Obstructive sleep apnea  Other Active Problems  Back pain - hx back surgery - needs IVF and pain control  Hospital day # [place] for Pager information 12/17/2016 3:21 PM  I have personally examined this patient, reviewed notes, independently viewed imaging studies, participated in medical decision making and plan of care.ROS completed by me personally and pertinent positives fully documented  I have made any additions or clarifications directly to the above note. Agree with note above. She presented with a stuttering course of left hemiparesis due to right subcortical infarct from small vessel disease. Recommend aspirin for stroke prevention and continue ongoing stroke workup. Physical occupational therapy and rehabilitation consult. Long discussion of the bedside with the patient and multiple family members and answered questions. Follow echocardiogram results. Transfer to inpatient rehabilitation over the next few days after insurance approval. Follow-up in the stroke clinic in 6 weeks. Stroke team will sign off. Kindly call for questions. Greater than 50% time during this 25 minute visit was spent on counseling and coordination of care about stroke, recurrence, prevention and treatment  Antony Contras, MD Medical Director [place] Pager: [phone] 12/17/2016 3:21 PM  To contact Stroke Continuity provider, please refer to [email or web address] After hours, contact General Neurology

## 2016-12-17 NOTE — H&P (Signed)
Physical Medicine and Rehabilitation Admission H&P     Chief Complaint  Patient presents with  . Left sided weakness and slurred speech  HPI: Ellen Henry is a 71 y.o. female with history of HTN, chronic back pain with radiculopathy, OSA who was admitted on 12/14/16 with complaints of diffuse weakness. She was found to have left facial weakness on evaluation by EMS and symptoms resolved in ED therefore tPA not administered. CT head negative. She developed left sided weakness later that evening and CTA head/neck without perfusion abnormality or occlusion/high grade stenosis. Therefore not endovascular candidate. 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 She was started on ASA for secondary stroke prevention and Work up initiated. Therapy evaluations done and limited by pain as well as decreased balance at EOB. Swallow evaluation done and diet advanced to dysphagia 2, thin liquids today. She has had difficulty voiding with PVR > 700cc this am. CIR recommended for follow up therapy.  Review of Systems  HENT: Negative for hearing loss and tinnitus.  Eyes: Negative for blurred vision and double vision.  Respiratory: Negative for cough and wheezing.  Cardiovascular: Negative for chest pain, palpitations and leg swelling.  Gastrointestinal: Positive for constipation (uses suppository every other day). Negative for heartburn and nausea.  Genitourinary:  History of urinary retention post surgery.  Musculoskeletal: Positive for back pain (chronic back pain radiating to LLE> RLE) and myalgias.  Skin: Negative for itching and rash.  Neurological: Positive for sensory change (occasional hypersensitivity), speech change, focal weakness and headaches (since stroke). Negative for dizziness.  Psychiatric/Behavioral: The patient is nervous/anxious.       Past Medical History:  Diagnosis Date  . Arthritis   . Dry skin   . GERD (gastroesophageal reflux disease)     "years ago", diet controlled  . Gout   . H/O pyelonephritis    as a child  . 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 Hypertension   . Neuropathy ([place])   . Pneumonia   . Radiculopathy   . Sleep apnea    does not use cpap        Past Surgical History:  Procedure Laterality Date  . ABDOMINAL EXPOSURE N/A 08/29/2014   Procedure: ABDOMINAL EXPOSURE; Surgeon: Rosetta Posner, MD; Location: [facility]; Service: Vascular; Laterality: N/A;  . ABDOMINAL HYSTERECTOMY    . ANTERIOR LUMBAR FUSION N/A 08/29/2014   Procedure: ANTERIOR LUMBAR FUSION 1 LEVEL; Surgeon: Sinclair Ship, MD; Location: [facility]; Service: Orthopedics; Laterality: N/A; Lumbar 4-5 anterior lumbar interbody fusion with allograft and instrumentation.  . APPENDECTOMY    . BACK SURGERY  08/28/2014 and 08/29/2014   lumbar fusion  . CARPAL TUNNEL RELEASE Right    release done twice  . COLONOSCOPY    . JOINT REPLACEMENT    . REPLACEMENT TOTAL KNEE  2010   rigth knee  . TONSILLECTOMY    . TOTAL KNEE ARTHROPLASTY Left 02/15/2015   Procedure: TOTAL KNEE ARTHROPLASTY; Surgeon: [name], MD; Location: [facility]; Service: Orthopedics; Laterality: Left;        Family History  Problem Relation Age of Onset  . Varicose Veins Mother    Social History: Lives with daughter and grandchildren. Daughters returning to work in the next 1-2 weeks. 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 Helps take care of grandchildren. Does an exercise program twice a week. She reports that she has been smoking Cigarettes --almost a pack per day. She has never  used smokeless tobacco. She reports that she does not drink alcohol or use drugs.       Allergies  Allergen Reactions  . Food Allergy Formula Other (See Comments)    Shellfish-banana-beef-flares up her gout         Medications Prior to Admission  Medication Sig Dispense Refill  . acetaminophen (TYLENOL) 500 MG tablet Take 1,000 mg by mouth every 6 (six) hours as needed for mild pain.     Marland Kitchen aspirin EC 81 MG tablet Take 81 mg by mouth every 6 (six) hours as needed for mild pain.    Marland Kitchen colchicine 0.6 MG tablet Take 1 tablet (0.6 mg total) by mouth daily. (Patient taking differently: Take 0.6 mg by mouth daily as needed (pain). ) 1 tablet 0  . ibuprofen (ADVIL,MOTRIN) 200 MG tablet Take 400 mg by mouth every 6 (six) hours as needed for moderate pain.    . indomethacin (INDOCIN) 25 MG capsule Take 1 po TID with food x 3 d then 1 po BID x 3d then 1 po QD x 3d (Patient taking differently: Take 25 mg by mouth daily as needed for mild pain. ) 18 capsule 0   Home:  Home Living  Family/patient expects to be discharged to:: Private residence  Living Arrangements: Children, Other relatives  Available Help at Discharge: Family, Available PRN/intermittently  Type of Home: House  Home Access: Stairs to enter  Technical brewer of Steps: 3  Entrance Stairs-Rails: None  Home Layout: One level  Bathroom Shower/Tub: Administrator, Civil Service: Standard  Bathroom Accessibility: Yes  Home Equipment: Environmental consultant - 2 wheels, Cane - quad, Bedside commode  Additional Comments: Most details of home environment were taken from a previous admission. Poor historian at time of eval.  Lives With: Daughter (grandkids)  Functional History:  Prior Function  Level of Independence: Independent  Comments: Per pt, able to  Functional Status:  Mobility:  Bed Mobility  Overal bed mobility: Needs Assistance  Bed Mobility: Supine to Sit, Sidelying to Sit  Rolling: Supervision  Supine to sit: Min assist, +2 for physical assistance  Sit to sidelying: Mod assist  General bed mobility comments: Min assist for trunk elevation and safety with heavy dependence of railing using R UE. L UE inattention and min assist with L LE during transfer.  Transfers  Overall transfer level: Needs assistance  Equipment used: Charlaine Dalton)  Transfer via Lift Equipment: Stedy  Transfers: Sit to/from Stand  Sit to Stand: Mod  assist, +2 physical assistance  General transfer comment: Pt requiring v/c for hip ext during sit to stand and postural awareness. Mod assist for L UE due to weakness and lack of coordination to keep hand on stedy. Knee instability observed in stedy frame; pt will likely need L knee block during progression to standing with walker.    ADL:  ADL  Overall ADL's : Needs assistance/impaired  Eating/Feeding Details (indicate cue type and reason): Pt NPO  Grooming: Moderate assistance, Sitting  Upper Body Bathing: Maximal assistance, Sitting  Lower Body Bathing: Maximal assistance, Sitting/lateral leans  Upper Body Dressing : Maximal assistance, Sitting  Lower Body Dressing: Maximal assistance, Sitting/lateral leans  General ADL Comments: Pt unable to tolerate more than sitting at EOB due to reported back pain. Leaning side to side frequently potentially due to pain. Decreased awareness of L UE and noted this becoming pinned behind her when completing bed mobility.  Cognition:  Cognition  Overall Cognitive Status: Within Functional Limits for tasks assessed  Arousal/Alertness: Awake/alert  Orientation Level: Oriented X4  Attention: Sustained  Sustained Attention: Appears intact  Memory: Appears intact  Awareness: Appears intact  Problem Solving: Appears intact  Safety/Judgment: Appears intact  Cognition  Arousal/Alertness: Awake/alert  Behavior During Therapy: Anxious, Impulsive  Overall Cognitive Status: Within Functional Limits for tasks assessed  General Comments: Pt impulsive to get out of bed once told about treatment of standing today. Required cues for sitting still.  Physical Exam:  Blood pressure (!) 147/93, pulse (!) 101, temperature 98.3 F (36.8 C), temperature source Oral, resp. rate 20, height 5\' 8"  (1.727 m), weight 104.3 kg (230 lb), SpO2 98 %.  Physical Exam  Nursing note and vitals reviewed.  Constitutional: She is oriented to person, place, and time. She appears  well-developed and well-nourished.  HENT:  Head: Normocephalic and atraumatic.  Mouth/Throat: Oropharynx is clear and moist.  Eyes: Conjunctivae are normal. Pupils are equal, round, and reactive to light.  Neck: Normal range of motion. Neck supple.  Cardiovascular: Normal rate and regular rhythm.  Respiratory: Effort normal and breath sounds normal. No stridor. No respiratory distress. She has no wheezes.  GI: Soft. Bowel sounds are normal. She exhibits no distension. There is no tenderness.  Musculoskeletal: She exhibits no edema or tenderness.  Neurological: She is alert and oriented to person, place, and time.  Left facial droop with mild dysarthria. No obvious tongue deviation. Visual fields intact.. Able to answer orientation questions without difficulty. Reasonable insight and awareness. Has poor postural reflexes but able to self correct in bed. Mild left inattention. LUE: deltoid 2-, bicep 2-, tricep 1, wrist/hand tr to 1-. LLE: 2-HF, 2KE and trace ADF/PF. Sensation intact left leg arm  Cerebellar not tested due to weakness Skin: Skin is warm and dry.   Lab Results Last 48 Hours  Imaging Results (Last 48 hours)     Medical Problem List and Plan:  1. 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 DVT Prophylaxis/Anticoagulation: Pharmaceutical: Lovenox  3. Back pain/RLE neuropathy/Pain Management: Will start low dose neurontin and titrate upwards.  4. Mood: team to provide ego support. LCSW to follow for evaluation and support.  5. Neuropsych: This patient is capable of making decisions on her own behalf.  6. Skin/Wound Care: routine pressure relief measures.  7. Fluids/Electrolytes/Nutrition: Monitor I/O. Check lytes in am.  8. [id number] BP bid--permissive HTN for a week.  9. Urinary retention: Check UA/UCS. Scheduled toileting--monitor voiding with PVR checks.  10. Chronic constipation: Uses suppository every other day. Will add senna S at bedtime with suppository daily as needed.  11. Hypokalemia : Likely dilutional-- supplemented today. Recheck in lytes in am. Discontinue IVF.  12. Dyslipidemia: On zocor  13. Tobacco abuse: Nicotine patch. Encourage cessation.  14. H/o Gout: Last flare past fall. Monitor for now.  Post Admission Physician Evaluation:  1. Functional deficits secondary to Left hemiplegia. 2. Patient is admitted to receive collaborative, interdisciplinary care between the physiatrist, rehab nursing staff, and therapy team. 3. Patient's level of medical complexity and substantial  therapy needs in context of that medical necessity cannot be provided at a lesser intensity of care such as a SNF. 4. Patient has experienced substantial functional loss from his/her baseline which was documented above under the "Functional History" and "Functional Status" headings. Judging by the patient's diagnosis, physical exam, and functional history, the patient has potential for functional progress which will result in measurable gains while on inpatient rehab. These gains will be of substantial and practical use upon discharge in facilitating mobility and self-care at the household level. 5. Physiatrist will provide 24 hour management of medical needs as well as oversight of the therapy plan/treatment and provide guidance as appropriate regarding the interaction of the two. 6. The Preadmission Screening has been reviewed and patient status is unchanged unless otherwise stated above. 7. 24 hour rehab nursing will assist with bladder management, bowel management, safety, skin/wound care, disease management, medication administration, pain management and patient education and help integrate therapy concepts, techniques,education, etc. 8. PT will assess and treat for/with: pre gait, gait training, endurance , safety, equipment, neuromuscular re education. Goals are: Min A/Sup. 9. OT will assess and treat for/with: ADLs, Cognitive perceptual skills, Neuromuscular re education, safety, endurance, equipment. Goals are: Sup/MinA. Therapy may proceed with showering this patient. 10. SLP will assess and treat for/with: NA. Goals are: NA. 11. Case Management and Social Worker will assess and treat for psychological issues and discharge planning. 12. Team conference will be held weekly to assess progress toward goals and to determine barriers to discharge. 13. Patient will receive at least 3 hours of therapy per day at least 5 days per week. 14. ELOS: 17-21d  15. Prognosis: good Charlett Blake  M.D. [facility] FAAPM&R (Sports Med, Neuromuscular Med) Diplomate Am Board of Electrodiagnostic Med   Flora Lipps  12/17/2016

## 2016-12-17 NOTE — Clinical Social Work Note (Signed)
Clinical Social Work Assessment  Patient Details  Name: Ellen Henry MRN: [id number] Date of Birth: 03/03/1946  Date of referral:  12/17/16               Reason for consult:  Facility Placement, Discharge Planning                Permission sought to share information with:  Facility Sport and exercise psychologist, Family Supports Permission granted to share information::  Yes, Verbal Permission Granted  Name::     Ellen Henry  Agency::  SNF's  Relationship::  Daughter  Contact Information:  [phone]  Housing/Transportation Living arrangements for the past 2 months:  Single Family Home Source of Information:  Patient, Medical Team, Adult Children Patient Interpreter Needed:  None Criminal Activity/Legal Involvement Pertinent to Current Situation/Hospitalization:  No - Comment as needed Significant Relationships:  Adult Children, Other Family Members, Parents Lives with:  Adult Children, Other (Comment) (Other family members) Do you feel safe going back to the place where you live?  Yes Need for family participation in patient care:  Yes (Comment)  Care giving concerns:  PT recommending CIR. CSW initiating SNF backup.   Social Worker assessment / plan:  CSW met with patient. Daughter and granddaughter at bedside. CSW introduced role and explained that PT recommendations would be discussed. Patient and her daughter agreeable to SNF backup search. CIR is first preference. [facility] is first SNF preference. No further concerns. CSW awaiting call back from [facility] to confirm PASARR number. No further concerns. CSW encouraged patient and her daughter to contact CSW as needed. CSW will continue to follow patient and her family for support and facilitate discharge to SNF once medically stable, if needed.  Employment status:  Retired Nurse, adult PT Recommendations:  Inpatient [facility] / Referral to community resources:  [place]  Patient/Family's Response to care:  Patient and her daughter agreeable to SNF backup search. Patient's family supportive and involved in patient's care. Patient and her daughter appreciated social work intervention.  Patient/Family's Understanding of and Emotional Response to Diagnosis, Current Treatment, and Prognosis:  Patient and her daughter understand reason for current hospitalization. Patient and her daughter appear happy with hospital care.  Emotional Assessment Appearance:  Appears stated age Attitude/Demeanor/Rapport:  Other (Pleasant) Affect (typically observed):  Accepting, Appropriate, Calm, Pleasant Orientation:  Oriented to Self, Oriented to Place, Oriented to  Time, Oriented to Situation Alcohol / Substance use:  Never Used Psych involvement (Current and /or in the community):  No (Comment)  Discharge Needs  Concerns to be addressed:  Care Coordination Readmission within the last 30 days:  No Current discharge risk:  Dependent with Mobility Barriers to Discharge:  Continued Medical Work up   Candie Chroman, LCSW 12/17/2016, 2:29 PM

## 2016-12-17 NOTE — Progress Notes (Signed)
Rehab admissions - I spoke with 2 daughters at the bedside.  Dtrs plan to have caregiver after rehab stay.  I have opened the case and have faxed information to [facility] requesting acute inpatient rehab admission.  I will follow up once I hear back from insurance case manager.  Call me for questions.  RC:[phone]

## 2016-12-17 NOTE — Evaluation (Signed)
Speech Language Pathology Evaluation Patient Details Name: Ellen Henry MRN: [id number] DOB: 06-Feb-1946 Today's Date: 12/17/2016 Time: [id number] SLP Time Calculation (min) (ACUTE ONLY): 22 min  Problem List:  Patient Active Problem List   Diagnosis Date Noted  . Generalized weakness   . Acute ischemic stroke ([place]) 12/15/2016  . Urinary retention 02/25/2015  . Primary osteoarthritis of left knee 02/15/2015  . Morbid obesity ([place]) 02/15/2015  . Essential hypertension, benign 09/07/2014  . Constipation 09/07/2014  . Hyperlipidemia 09/05/2014  . Gout 09/05/2014  . Acute blood loss anemia 09/05/2014  . Neuropathy ([place]) 09/05/2014  . UTI (urinary tract infection) 08/31/2014  . Radiculopathy 08/29/2014   Past Medical History:  Past Medical History:  Diagnosis Date  . Arthritis   . Dry skin   . GERD (gastroesophageal reflux disease)    "years ago", diet controlled  . Gout   . H/O pyelonephritis    as a child  . 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 Hypertension   . Neuropathy ([place])   . Pneumonia   . Radiculopathy   . Sleep apnea    does not use cpap   Past Surgical History:  Past Surgical History:  Procedure Laterality Date  . ABDOMINAL EXPOSURE N/A 08/29/2014   Procedure: ABDOMINAL EXPOSURE;  Surgeon: Rosetta Posner, MD;  Location: [facility];  Service: Vascular;  Laterality: N/A;  . ABDOMINAL HYSTERECTOMY    . ANTERIOR LUMBAR FUSION N/A 08/29/2014   Procedure: ANTERIOR LUMBAR FUSION 1 LEVEL;  Surgeon: Sinclair Ship, MD;  Location: [facility];  Service: Orthopedics;  Laterality: N/A;  Lumbar 4-5 anterior lumbar interbody fusion with allograft and instrumentation.  . APPENDECTOMY    . BACK SURGERY  08/28/2014 and 08/29/2014   lumbar fusion  . CARPAL TUNNEL RELEASE Right    release done twice  . COLONOSCOPY    . JOINT REPLACEMENT    . REPLACEMENT TOTAL KNEE  2010   rigth knee  . TONSILLECTOMY    . TOTAL KNEE ARTHROPLASTY Left 02/15/2015   Procedure: TOTAL  KNEE ARTHROPLASTY;  Surgeon: [name], MD;  Location: [facility];  Service: Orthopedics;  Laterality: Left;   HPI:  Pt is a 71 y/o female with a PMH significant for chronic back pain, HTN, GERD, pna presented for acute onset of stuttering and TIA symptoms. MRI acute infarct posterior limb internal capsule on the right. CXR no acute cardiopulmonary process, stable examination.   Assessment / Plan / Recommendation Clinical Impression  Pt scored WNL's on all administered subtest of the Cognistat. Pt indepnedent PTA and lives with daughter and grandchildren. Briefly educated on memory techniques if needed. No cog/speech tx needed on acute venue however if transfers to rehab will be assessed for higher level executive functioning abilities.      SLP Assessment  Patient does not need any further Speech Lanaguage Pathology Services    Follow Up Recommendations  Inpatient Rehab    Frequency and Duration           SLP Evaluation Cognition  Overall Cognitive Status: Within Functional Limits for tasks assessed Arousal/Alertness: Awake/alert Orientation Level: Oriented X4 Attention: Sustained Sustained Attention: Appears intact Memory: Appears intact Awareness: Appears intact Problem Solving: Appears intact Safety/Judgment: Appears intact       Comprehension  Auditory Comprehension Overall Auditory Comprehension: Appears within functional limits for tasks assessed Yes/No Questions: Within Functional Limits Commands: Within Functional Limits Conversation: Simple Visual Recognition/Discrimination Discrimination: Not tested Reading Comprehension Reading Status: Within  funtional limits    Expression Expression Primary Mode of Expression: Verbal Verbal Expression Overall Verbal Expression: Appears within functional limits for tasks assessed Initiation: No impairment Level of Generative/Spontaneous Verbalization: Conversation Repetition: No impairment Naming: No impairment Pragmatics:  No impairment Written Expression Dominant Hand: Right Written Expression: Not tested   Oral / Motor  Oral Motor/Sensory Function Overall Oral Motor/Sensory Function: Mild impairment Facial ROM: Reduced left;Suspected CN VII (facial) dysfunction Facial Symmetry: Abnormal symmetry left;Suspected CN VII (facial) dysfunction Facial Strength: Reduced left;Suspected CN VII (facial) dysfunction Lingual ROM: Within Functional Limits Lingual Symmetry: Within Functional Limits Lingual Strength: Within Functional Limits Velum: Within Functional Limits Motor Speech Overall Motor Speech: Appears within functional limits for tasks assessed Respiration: Within functional limits Phonation: Normal Resonance: Within functional limits Articulation: Within functional limitis (slightly imprecise intermittently) Intelligibility: Intelligible Motor Planning: Witnin functional limits   GO                    Houston Siren 12/17/2016, 10:20 AM  Orbie Pyo Colvin Caroli.Ed [employer] [phone]

## 2016-12-17 NOTE — H&P (Deleted)
Pt arrived to unit at 1655 with daughter at bedside.  Educated on rehab process, safety plan, and schedule with verbal understanding.   RN discussed patients bowel program (suppository every other day, 6am) with Algis Liming, PA with no new orders at this time. PRN enema to be given per Algis Liming.

## 2016-12-17 NOTE — Progress Notes (Signed)
Speech Language Pathology Treatment: Dysphagia  Patient Details Name: Ellen Henry MRN: [id number] DOB: September 27, 1946 Today's Date: 12/17/2016 Time: [id number] SLP Time Calculation (min) (ACUTE ONLY): 22 min  Assessment / Plan / Recommendation Clinical Impression  Pt seen for skilled dysphagia intervention for possible liquid upgrade. Consumed total 3 oz thin water via cup and straw with no overt indications of aspiration. Did not assess texture upgrade due to absent dentures. Recommend upgrade to thin liquids, continue Dys 2 texture, straws allowed small sips, pills whole in applesauce. Pt transferring to CIR soon if insurance allows. Will follow.   HPI HPI: Pt is a 71 y/o female with a PMH significant for chronic back pain, HTN, GERD, pna presented for acute onset of stuttering and TIA symptoms. MRI acute infarct posterior limb internal capsule on the right. CXR no acute cardiopulmonary process, stable examination.      SLP Plan  Continue with current plan of care     Recommendations  Diet recommendations: Dysphagia 2 (fine chop);Thin liquid Liquids provided via: Cup;Straw Medication Administration: Whole meds with puree Supervision: Patient able to self feed;Intermittent supervision to cue for compensatory strategies;Staff to assist with self feeding Compensations: Slow rate;Small sips/bites Postural Changes and/or Swallow Maneuvers: Seated upright 90 degrees                General recommendations: Rehab consult Oral Care Recommendations: Oral care BID Follow up Recommendations: Inpatient Rehab Plan: Continue with current plan of care       GO                Houston Siren 12/17/2016, 10:05 AM Orbie Pyo Colvin Caroli.Ed [employer] [phone]

## 2016-12-17 NOTE — Progress Notes (Signed)
Pt arrived to unit at 1655 with daughter at bedside.  Educated on rehab process, safety plan, and schedule with verbal understanding.   RN discussed patients bowel program (suppository every other day, 6am) with Algis Liming, PA with no new orders at this time. PRN enema to be given per Algis Liming.

## 2016-12-18 ENCOUNTER — Inpatient Hospital Stay (HOSPITAL_COMMUNITY): Payer: Self-pay | Admitting: Occupational Therapy

## 2016-12-18 ENCOUNTER — Inpatient Hospital Stay (HOSPITAL_COMMUNITY): Payer: Medicare Other | Admitting: Speech Pathology

## 2016-12-18 ENCOUNTER — Inpatient Hospital Stay (HOSPITAL_COMMUNITY): Payer: Self-pay | Admitting: Physical Therapy

## 2016-12-18 ENCOUNTER — Inpatient Hospital Stay (HOSPITAL_COMMUNITY): Payer: Medicare Other | Admitting: Occupational Therapy

## 2016-12-18 DIAGNOSIS — G8190 Hemiplegia, unspecified affecting unspecified side: Secondary | ICD-10-CM

## 2016-12-18 DIAGNOSIS — I69391 Dysphagia following cerebral infarction: Secondary | ICD-10-CM

## 2016-12-18 DIAGNOSIS — I10 Essential (primary) hypertension: Secondary | ICD-10-CM

## 2016-12-18 DIAGNOSIS — R339 Retention of urine, unspecified: Secondary | ICD-10-CM

## 2016-12-18 DIAGNOSIS — M545 Low back pain, unspecified: Secondary | ICD-10-CM | POA: Insufficient documentation

## 2016-12-18 DIAGNOSIS — K5901 Slow transit constipation: Secondary | ICD-10-CM

## 2016-12-18 DIAGNOSIS — G8929 Other chronic pain: Secondary | ICD-10-CM | POA: Insufficient documentation

## 2016-12-18 LAB — COMPREHENSIVE METABOLIC PANEL
ALT: 88 U/L — ABNORMAL HIGH (ref 14–54)
AST: 121 U/L — ABNORMAL HIGH (ref 15–41)
Albumin: 3.3 g/dL — ABNORMAL LOW (ref 3.5–5.0)
Alkaline Phosphatase: 132 U/L — ABNORMAL HIGH (ref 38–126)
Anion gap: 9 (ref 5–15)
BUN: 7 mg/dL (ref 6–20)
CO2: 24 mmol/L (ref 22–32)
Calcium: 9.8 mg/dL (ref 8.9–10.3)
Chloride: 108 mmol/L (ref 101–111)
Creatinine, Ser: 0.71 mg/dL (ref 0.44–1.00)
GFR calc Af Amer: >60 mL/min (ref >60)
GFR calc non Af Amer: >60 mL/min (ref >60)
Glucose, Bld: 96 mg/dL (ref 65–99)
Potassium: 4.4 mmol/L (ref 3.5–5.1)
Sodium: 141 mmol/L (ref 135–145)
Total Bilirubin: 0.7 mg/dL (ref 0.3–1.2)
Total Protein: 7.3 g/dL (ref 6.5–8.1)

## 2016-12-18 LAB — CBC WITH DIFFERENTIAL/PLATELET
Basophils Absolute: 0 10*3/uL (ref 0.0–0.1)
Basophils Relative: 0 %
Eosinophils Absolute: 0.3 10*3/uL (ref 0.0–0.7)
Eosinophils Relative: 4 %
HCT: 41.3 % (ref 36.0–46.0)
Hemoglobin: 13.9 g/dL (ref 12.0–15.0)
Lymphocytes Relative: 49 %
Lymphs Abs: 4 10*3/uL (ref 0.7–4.0)
MCH: 27.9 pg (ref 26.0–34.0)
MCHC: 33.7 g/dL (ref 30.0–36.0)
MCV: 82.9 fL (ref 78.0–100.0)
Monocytes Absolute: 0.6 10*3/uL (ref 0.1–1.0)
Monocytes Relative: 8 %
Neutro Abs: 3.2 10*3/uL (ref 1.7–7.7)
Neutrophils Relative %: 39 %
Platelets: 211 10*3/uL (ref 150–400)
RBC: 4.98 MIL/uL (ref 3.87–5.11)
RDW: 14.8 % (ref 11.5–15.5)
WBC: 8.2 10*3/uL (ref 4.0–10.5)

## 2016-12-18 LAB — URINE CULTURE: Culture: NO GROWTH

## 2016-12-18 MED ORDER — METHOCARBAMOL 500 MG PO TABS
500.0000 mg | ORAL_TABLET | Freq: Four times a day (QID) | ORAL | Status: DC | PRN
  Administered 2016-12-18 – 2016-12-31 (×27): 500 mg via ORAL
  Filled 2016-12-18 (×28): qty 1

## 2016-12-18 MED ORDER — FLUOXETINE HCL 10 MG PO CAPS
10.0000 mg | ORAL_CAPSULE | Freq: Every day | ORAL | Status: DC
  Administered 2016-12-18 – 2016-12-31 (×14): 10 mg via ORAL
  Filled 2016-12-18 (×14): qty 1

## 2016-12-18 NOTE — Progress Notes (Signed)
Orthopedic Tech Progress Note Patient Details:  Ellen Henry [birth year] [id number]  Patient ID: Ellen Henry, female   DOB: [birth year], 71 y.o.   MRN: [id number]   Ellen Henry 12/18/2016, 10:01 AM Called in advanced brace order; spoke with [facility]

## 2016-12-18 NOTE — Progress Notes (Addendum)
[facility] PHYSICAL MEDICINE & REHABILITATION     PROGRESS NOTE  Subjective/Complaints:  Pt seen laying in bed this AM.  She states she slept fair on her first night.  She states she did have some back pain, which she has chronically.   ROS: Denies CP, SOB, N/V/D.  Objective: Vital Signs: Blood pressure (!) 163/89, pulse 65, temperature 97.7 F (36.5 C), temperature source Oral, resp. rate 18, height 5' (1.524 m), weight 103.2 kg (227 lb 9.6 oz), SpO2 98 %. No results found.  Recent Labs  12/17/16 0636 12/18/16 0634  WBC 9.0 8.2  HGB 13.0 13.9  HCT 38.8 41.3  PLT 253 211    Recent Labs  12/17/16 0636 12/18/16 0634  NA 143 141  K 3.3* 4.4  CL 108 108  GLUCOSE 106* 96  BUN 6 7  CREATININE 0.65 0.71  CALCIUM 9.3 9.8   CBG (last 3)   Recent Labs  12/15/16 2101  GLUCAP 103*    Wt Readings from Last 3 Encounters:  12/17/16 103.2 kg (227 lb 9.6 oz)  12/15/16 104.3 kg (230 lb)  03/10/15 108.9 kg (240 lb)    Physical Exam:  BP (!) 163/89 (BP Location: Right Arm)   Pulse 65   Temp 97.7 F (36.5 C) (Oral)   Resp 18   Ht 5' (1.524 m)   Wt 103.2 kg (227 lb 9.6 oz)   SpO2 98%   BMI 44.45 kg/m  Constitutional: She appears well-developed and well-nourished.  HENT: Normocephalic and atraumatic.  Eyes: EOMI. No discharge.  Cardiovascular: Normal rate and regular rhythm. No JVD. Respiratory: Effort normal and breath sounds normal.  GI: Soft. Bowel sounds are normal.  Musculoskeletal: She exhibits no edema or tenderness.  Neurological: She is alert and oriented.  Left facial droop with mild dysarthria.  Reasonable insight and awareness into deficits.  Motor: LUE: deltoid 2-/5, distally 1/5  LLE: 1/5 HF, distally 0/5 Skin: Skin is warm and dry. Intact.   Assessment/Plan: 1. Functional deficits secondary to right post limb internal capsule infarct which require 3+ hours per day of interdisciplinary therapy in a comprehensive inpatient rehab setting. Physiatrist  is providing close team supervision and 24 hour management of active medical problems listed below. Physiatrist and rehab team continue to assess barriers to discharge/monitor patient progress toward functional and medical goals.  Function:  Bathing Bathing position      Bathing parts      Bathing assist        Upper Body Dressing/Undressing Upper body dressing                    Upper body assist        Lower Body Dressing/Undressing Lower body dressing                                  Lower body assist        Toileting Toileting   Toileting steps completed by patient: Performs perineal hygiene Toileting steps completed by helper: Adjust clothing after toileting, Adjust clothing prior to toileting    Toileting assist Assist level: Touching or steadying assistance (Pt.75%)   Transfers Chair/bed transfer   Chair/bed transfer method: Stand pivot (hemi walker) Chair/bed transfer assist level: Moderate assist (Pt 50 - 74%/lift or lower) Chair/bed transfer assistive device: Bedrails, Armrests, Walker (hemi-walker)     Locomotion Ambulation     Max distance:  (5 ft) Assist  level: Moderate assist (Pt 50 - 74%)   Wheelchair   Type: Manual   Assist Level: Touching or steadying assistance (Pt > 75%)  Cognition Comprehension Comprehension assist level: Understands complex 90% of the time/cues 10% of the time  Expression Expression assist level: Expresses complex 90% of the time/cues < 10% of the time  Social Interaction Social Interaction assist level: Interacts appropriately with others with medication or extra time (anti-anxiety, antidepressant).  Problem Solving Problem solving assist level: Solves basic 90% of the time/requires cueing < 10% of the time  Memory Memory assist level: More than reasonable amount of time     Medical Problem List and Plan:  1. 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  MRI  reviewed, limited, see above  Begin CIR  Bracing ordered  Fluoxetine started 1/12 2. DVT Prophylaxis/Anticoagulation: Pharmaceutical: Lovenox  3. Back pain/RLE neuropathy/Pain Management:   Low dose neurontin and titrate upwards.   Robaxin PRN started 1/12 4. Mood: team to provide ego support. LCSW to follow for evaluation and support.  5. Neuropsych: This patient is capable of making decisions on her own behalf.  6. Skin/Wound Care: routine pressure relief measures.  7. Fluids/Electrolytes/Nutrition: Monitor I/O.   Dysphag 2 thin, advance as tolerated.  BMP within acceptable range 1/12 8. [id number] BP bid  Permissive HTN for now, will start medications tomorrow 9. Urinary retention: Improving  UA neg, Ucx pending.   Scheduled toileting--monitor voiding with PVR neg.  10. Chronic constipation: Uses suppository every other day. Added senna S at bedtime with suppository daily as needed.  11. Hypokalemia : Likely dilutional-- supplemented 1/11.   WNL on 1/12 12. Dyslipidemia: On zocor  13. Tobacco abuse: Nicotine patch. Encourage cessation.  14. H/o Gout: Last flare past fall. Monitor for now.   LOS (Days) 1 A FACE TO FACE EVALUATION WAS PERFORMED  Ankit Lorie Phenix 12/18/2016 9:29 AM

## 2016-12-18 NOTE — Evaluation (Signed)
Physical Therapy Assessment and Plan  Patient Details  Name: Ellen Henry MRN: [id number] Date of Birth: 11/03/1946  PT Diagnosis: Abnormality of gait Rehab Potential: Good ELOS: 16-18 days   Today's Date: 12/18/2016 PT Individual Time: 0800-0858 PT Individual Time Calculation (min): 58 min     Problem List:  Patient Active Problem List   Diagnosis Date Noted  . Benign essential HTN   . Slow transit constipation   . Chronic bilateral low back pain without sciatica   . Dysphagia, post-stroke   . Stroke (cerebrum) ([place]) 12/17/2016  . Left hemiparesis ([place])   . Generalized weakness   . Acute ischemic stroke ([place]) 12/15/2016  . Urinary retention 02/25/2015  . Primary osteoarthritis of left knee 02/15/2015  . Morbid obesity ([place]) 02/15/2015  . Essential hypertension, benign 09/07/2014  . Constipation 09/07/2014  . Hyperlipidemia 09/05/2014  . Gout 09/05/2014  . Acute blood loss anemia 09/05/2014  . Neuropathy ([place]) 09/05/2014  . UTI (urinary tract infection) 08/31/2014  . Radiculopathy 08/29/2014    Past Medical History:  Past Medical History:  Diagnosis Date  . Arthritis   . Dry skin   . GERD (gastroesophageal reflux disease)    "years ago", diet controlled  . Gout   . H/O pyelonephritis    as a child  . 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 Hypertension   . Neuropathy ([place])   . Pneumonia   . Radiculopathy   . Sleep apnea    does not use cpap   Past Surgical History:  Past Surgical History:  Procedure Laterality Date  . ABDOMINAL EXPOSURE N/A 08/29/2014   Procedure: ABDOMINAL EXPOSURE;  Surgeon: Rosetta Posner, MD;  Location: [facility];  Service: Vascular;  Laterality: N/A;  . ABDOMINAL HYSTERECTOMY    . ANTERIOR LUMBAR FUSION N/A 08/29/2014   Procedure: ANTERIOR LUMBAR FUSION 1 LEVEL;  Surgeon: Sinclair Ship, MD;  Location: [facility];  Service: Orthopedics;  Laterality: N/A;  Lumbar 4-5 anterior lumbar interbody fusion with allograft and  instrumentation.  . APPENDECTOMY    . BACK SURGERY  08/28/2014 and 08/29/2014   lumbar fusion  . CARPAL TUNNEL RELEASE Right    release done twice  . COLONOSCOPY    . JOINT REPLACEMENT    . REPLACEMENT TOTAL KNEE  2010   rigth knee  . TONSILLECTOMY    . TOTAL KNEE ARTHROPLASTY Left 02/15/2015   Procedure: TOTAL KNEE ARTHROPLASTY;  Surgeon: [name], MD;  Location: [facility];  Service: Orthopedics;  Laterality: Left;    Assessment & Plan Clinical Impression: Patient is a 71 y.o. year old female with recent admission to the hospital on 12/14/2016 with  history of HTN, chronic back pain with radiculopathy, OSA who was admitted on 12/14/16 with complaints of diffuse weakness. She was found to have left facial weakness on evaluation by EMS and symptoms resolved in ED therefore tPA not administered. CT head negative. She developed left sided weakness later that evening and CTA head/neck without perfusion abnormality or occlusion/high grade stenosis. Therefore not endovascular candidate. 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 She was started on ASA for secondary stroke prevention and Work up initiated. Therapy evaluations done and limited by pain as well as decreased balance at EOB. Swallow evaluation done and diet advanced to dysphagia 2, thin liquids today. She has had difficulty voiding with PVR > 700cc this am. CIR recommended for follow up therapy. .  Patient  transferred to CIR on 12/17/2016 .   Patient currently requires mod with mobility secondary to muscle weakness and muscle paralysis, impaired timing and sequencing, abnormal tone, unbalanced muscle activation and decreased motor planning, decreased midline orientation, decreased attention to left and decreased motor planning and decreased standing balance, decreased postural control and hemiplegia.  Prior to hospitalization, patient was independent  with mobility and lived with Daughter in a House home.   Home access is  (3)Stairs to enter.  Patient will benefit from skilled PT intervention to maximize safe functional mobility, minimize fall risk and decrease caregiver burden for planned discharge home with intermittent assist.  Anticipate patient will benefit from follow up [facility] at discharge.  PT - End of Session Activity Tolerance: Improving Endurance Deficit: Yes Endurance Deficit Description: Decreased endurance for functional tasks PT Assessment Rehab Potential (ACUTE/IP ONLY): Good PT Patient demonstrates impairments in the following area(s): Balance;Endurance;Motor;Pain;Perception;Safety PT Transfers Functional Problem(s): Bed Mobility;Bed to Chair;Car;Furniture PT Locomotion Functional Problem(s): Ambulation;Wheelchair Mobility;Stairs PT Plan PT Intensity: Minimum of 1-2 x/day ,45 to 90 minutes PT Frequency: 5 out of 7 days PT Duration Estimated Length of Stay: 16-18 days PT Treatment/Interventions: Ambulation/gait training;Balance/vestibular training;Community reintegration;DME/adaptive equipment instruction;Disease management/prevention;Discharge planning;Functional mobility training;Neuromuscular re-education;Pain management;Patient/family education;Stair training;Splinting/orthotics;Therapeutic Exercise;Therapeutic Activities;Psychosocial support;Wheelchair propulsion/positioning;UE/LE Strength taining/ROM;UE/LE Coordination activities PT Transfers Anticipated Outcome(s): Supervision PT Locomotion Anticipated Outcome(s): Supervision with gait; min A with stairs PT Recommendation Recommendations for Other Services: Therapeutic Recreation consult Follow Up Recommendations: Home health PT;Outpatient PT Patient destination: Home Equipment Recommended:  (Assistive device and possible AFO)  Skilled Therapeutic Intervention Treatment today focused on improving functional mobility to decrease burden of care for expected D/C home with family support.  Pt requires cues for weight shifting  with gait with hemi-walker and moderate assist for sit to stand and mat to chair due to decreased L LE muscle activation and awareness.  Pt additionally requires cues for R UE/LE utilization with functional activities.    PT Evaluation Precautions/Restrictions Precautions Precautions: Fall Precaution Comments: Inattention to L arm and L sided weakness Restrictions Weight Bearing Restrictions: No Pain - Reports 7/10 LBP - pre-medicated and already has hot pack machine in room Pain Assessment Pain Score: 6  Pain Type: Chronic pain Pain Location: Back Pain Descriptors / Indicators: Aching Pain Intervention(s): Medication (See eMAR) Home Living/Prior Functioning Home Living Living Arrangements: Children;Other relatives Type of Home: House Home Access: Stairs to enter [employer] of Steps:  (3) Entrance Stairs-Rails: None  Lives With: Daughter Prior Function Level of Independence: Independent with basic ADLs;Independent with homemaking with ambulation;Independent with gait;Independent with transfers  Able to Take Stairs?: Yes Comments:  (Per pt she was very independent PTA)    Cognition Overall Cognitive Status: Within Functional Limits for tasks assessed Arousal/Alertness: Awake/alert Orientation Level: Oriented X4 Attention: Focused;Sustained Focused Attention: Appears intact Sustained Attention: Appears intact Awareness: Appears intact Sensation Sensation Light Touch: Appears Intact Additional Comments:  (reports hypersensitivity to light touch in L LE) Coordination Gross Motor Movements are Fluid and Coordinated: No Fine Motor Movements are Fluid and Coordinated: No Coordination and Movement Description: Decreased ability to advance L LE during gait and decreased ability to utilize L UE functionally Motor  Motor Motor: Hemiplegia Motor - Skilled Clinical Observations:  (L ankle DF: 2-/5;  L finger/wrist ext:  0/5; L shoulder abd: 1/5; R LE/UE WFL from  functional standpoint)     Trunk/Postural Assessment  Cervical Assessment Cervical Assessment: Within Functional Limits Thoracic Assessment Thoracic Assessment: Within Functional Limits Lumbar Assessment Lumbar Assessment: Exceptions to  WFL (Increased lumbar lordosis when sitting in chair) Postural Control Postural Control: Deficits on evaluation (Decreased orientation to midline with decreased weight bearing on L LE)  Balance Balance Balance Assessed: Yes Static Sitting Balance Static Sitting - Level of Assistance: 5: Stand by assistance Dynamic Sitting Balance Dynamic Sitting - Level of Assistance: 5: Stand by assistance Static Standing Balance Static Standing - Level of Assistance: 4: Min assist Dynamic Standing Balance Dynamic Standing - Level of Assistance: 3: Mod assist Extremity Assessment        LLE Assessment LLE Assessment: Exceptions to [facility] LLE Strength LLE Overall Strength: Deficits LLE Overall Strength Comments: L ankle DF: 2-/5; Has difficulty isolating movement to move L LE for bed mobility;    See Function Navigator for Current Functional Status.   Refer to Care Plan for Long Term Goals  Recommendations for other services: Therapeutic Recreation  Kitchen group and Outing/community reintegration  Discharge Criteria: Patient will be discharged from PT if patient refuses treatment 3 consecutive times without medical reason, if treatment goals not met, if there is a change in medical status, if patient makes no progress towards goals or if patient is discharged from hospital.  The above assessment, treatment plan, treatment alternatives and goals were discussed and mutually agreed upon: by patient  LaVerene Hilario Quarry 12/18/2016, 12:30 PM

## 2016-12-18 NOTE — IPOC Note (Signed)
Overall Plan of Care [facility]) Patient Details Name: Ellen Henry MRN: [id number] DOB: [birth year]  Admitting Diagnosis: R CVA  Hospital Problems: Active Problems:   Stroke (cerebrum) ([place])   Left hemiparesis ([place])   Benign essential HTN   Slow transit constipation   Chronic bilateral low back pain without sciatica   Dysphagia, post-stroke     Functional Problem List: Nursing Bladder, Edema, Endurance, Medication Management, Motor, Nutrition, Safety, Bowel, Pain  PT Balance, Endurance, Motor, Pain, Perception, Safety  OT Balance, Endurance, Motor, Safety, Sensory  SLP Nutrition  TR         Basic ADL's: OT Grooming, Toileting, Dressing, Bathing     Advanced  ADL's: OT       Transfers: PT Bed Mobility, Bed to Chair, Car, Manufacturing systems engineer, Metallurgist: PT Ambulation, Emergency planning/management officer, Stairs     Additional Impairments: OT Fuctional Use of Upper Extremity  SLP Swallowing      TR      Anticipated Outcomes Item Anticipated Outcome  Self Feeding    Swallowing  mod I    Basic self-care  Media planner Transfers Supervision  Bowel/Bladder  Mod assist  Transfers  Supervision  Locomotion  Supervision with gait; min A with stairs  Communication     Cognition     Pain  5 or less (pt has chronic pain)  Safety/Judgment  minimal assist   Therapy Plan: PT Intensity: Minimum of 1-2 x/day ,45 to 90 minutes PT Frequency: 5 out of 7 days PT Duration Estimated Length of Stay: 16-18 days OT Intensity: Minimum of 1-2 x/day, 45 to 90 minutes OT Frequency: 5 out of 7 days OT Duration/Estimated Length of Stay: 16-20 SLP Intensity: Minumum of 1-2 x/day, 30 to 90 minutes SLP Frequency: 3 to 5 out of 7 days SLP Duration/Estimated Length of Stay: 16-20 days for PT/OT; will likely only need 7-10 days for SLP        Team Interventions: Nursing Interventions Patient/Family Education, Disease Management/Prevention,  Bladder Management, Medication Management, Dysphagia/Aspiration Precaution Training, Cognitive Remediation/Compensation, Pain Management, Bowel Management  PT interventions Ambulation/gait training, Training and development officer, Community reintegration, Engineer, drilling, Disease management/prevention, Discharge planning, Functional mobility training, Neuromuscular re-education, Pain management, Patient/family education, Stair training, Splinting/orthotics, Therapeutic Exercise, Therapeutic Activities, Psychosocial support, Wheelchair propulsion/positioning, UE/LE Strength taining/ROM, UE/LE Coordination activities  OT Interventions Training and development officer, Community reintegration, Discharge planning, DME/adaptive equipment instruction, Functional mobility training, Neuromuscular re-education, Pain management, Patient/family education, Self Care/advanced ADL retraining, Therapeutic Activities, Therapeutic Exercise, UE/LE Strength taining/ROM, UE/LE Coordination activities, Wheelchair propulsion/positioning  SLP Interventions Cueing hierarchy, Dysphagia/aspiration precaution training, Internal/external aids, Patient/family education  TR Interventions    SW/CM Interventions Discharge Planning, Psychosocial Support, Patient/Family Education    Team Discharge Planning: Destination: PT-Home ,OT- Home , SLP-Home Projected Follow-up: PT-Home health PT, Outpatient PT, OT-  Home health OT, SLP-None Projected Equipment Needs: PT- (Assistive device and possible AFO), OT- To be determined, SLP-None recommended by SLP Equipment Details: PT- , OT-  Patient/family involved in discharge planning: PT- Patient, Family member/caregiver,  OT-Patient, SLP-Patient  MD ELOS: 14-18 days. Medical Rehab Prognosis:  Good Assessment: 71 y.o. female with history of HTN, chronic back pain with radiculopathy, OSA who was admitted on 12/14/16 with complaints of diffuse weakness. She was found to have left  facial weakness on evaluation by EMS and symptoms resolved in ED therefore tPA not administered. CT head negative. She developed left sided weakness later that evening and CTA  head/neck without perfusion abnormality or occlusion/high grade stenosis. Therefore not endovascular candidate. 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 She was started on ASA for secondary stroke prevention and Work up initiated. Therapy evaluations done and limited by pain as well as decreased balance at EOB. Swallow evaluation done and diet advanced to dysphagia 2, thin liquids. Will set goals for Mod I/Supervision for most tasks with therapies.   See Team Conference Notes for weekly updates to the plan of care

## 2016-12-18 NOTE — Progress Notes (Signed)
Social Work Assessment and Plan Social Work Assessment and Plan  Patient Details  Name: Ellen Henry MRN: [id number] Date of Birth: 03-Apr-1946  Today's Date: 12/18/2016  Problem List:  Patient Active Problem List   Diagnosis Date Noted  . Benign essential HTN   . Slow transit constipation   . Chronic bilateral low back pain without sciatica   . Dysphagia, post-stroke   . 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 Left hemiparesis ([place])   . Generalized weakness   . Acute ischemic stroke ([place]) 12/15/2016  . Urinary retention 02/25/2015  . Primary osteoarthritis of left knee 02/15/2015  . Morbid obesity ([place]) 02/15/2015  . Essential hypertension, benign 09/07/2014  . Constipation 09/07/2014  . Hyperlipidemia 09/05/2014  . Gout 09/05/2014  . Acute blood loss anemia 09/05/2014  . Neuropathy ([place]) 09/05/2014  . UTI (urinary tract infection) 08/31/2014  . Radiculopathy 08/29/2014   Past Medical History:  Past Medical History:  Diagnosis Date  . Arthritis   . Dry skin   . GERD (gastroesophageal reflux disease)    "years ago", diet controlled  . Gout   . H/O pyelonephritis    as a child  . 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 Hypertension   . Neuropathy ([place])   . Pneumonia   . Radiculopathy   . Sleep apnea    does not use cpap   Past Surgical History:  Past Surgical History:  Procedure Laterality Date  . ABDOMINAL EXPOSURE N/A 08/29/2014   Procedure: ABDOMINAL EXPOSURE;  Surgeon: Rosetta Posner, MD;  Location: [facility];  Service: Vascular;  Laterality: N/A;  . ABDOMINAL HYSTERECTOMY    . ANTERIOR LUMBAR FUSION N/A 08/29/2014   Procedure: ANTERIOR LUMBAR FUSION 1 LEVEL;  Surgeon: Sinclair Ship, MD;  Location: [facility];  Service: Orthopedics;  Laterality: N/A;  Lumbar 4-5 anterior lumbar interbody fusion with allograft and instrumentation.  . APPENDECTOMY    . BACK SURGERY  08/28/2014 and 08/29/2014   lumbar fusion  . CARPAL TUNNEL RELEASE Right    release  done twice  . COLONOSCOPY    . JOINT REPLACEMENT    . REPLACEMENT TOTAL KNEE  2010   rigth knee  . TONSILLECTOMY    . TOTAL KNEE ARTHROPLASTY Left 02/15/2015   Procedure: TOTAL KNEE ARTHROPLASTY;  Surgeon: [name], MD;  Location: [facility];  Service: Orthopedics;  Laterality: Left;   Social History:  reports that she has been smoking Cigarettes.  She has been smoking about 0.25 packs per day. She has never used smokeless tobacco. She reports that she does not drink alcohol or use drugs.  Family / Support Systems Marital Status: Widow/Widower Patient Roles: Parent, Caregiver Children: Diea Ross-daughter [phone]   Threresa Gregory-daughter [phone] Other Supports: [place]-Mom [phone] Anticipated Caregiver: Daughter's and grandchildren Ability/Limitations of Caregiver: Daughter's work but plan to arrange caregiver while they are gone Caregiver Availability: 24/7 Family Dynamics: Close knit family who are supportive and involved-pt assisted with her grandchildren-especially the 32 yo. She has three daughter's who have grown children of their own. So between all of them they can work out a plan for discharge. Pt is usually the one providig the care so this is a change for her.  Social History Preferred language: English Religion: Baptist Cultural Background: No issues Education: [employer] Read: Yes Write: Yes Employment Status: Retired Freight forwarder Issues: No issues Guardian/Conservator: None-according to MD pt is capable of making her own decisions while here.  Abuse/Neglect Physical Abuse: Denies Verbal Abuse: Denies Sexual Abuse: Denies Exploitation of patient/patient's resources: Denies Self-Neglect: Denies  Emotional Status Pt's affect, behavior adn adjustment status: Pt is motivated to improve and recover from this stroke. She has been through back surgery and TKR but nothing like this. She is hopeful she will do well here and not require  much care at discharge. She is usually the one who is helping others not the other way around. Recent Psychosocial Issues: Other health issues-chronic back pain from previous back surgery.  Pyschiatric History: No history deferred depression screen due to first day and still adjusting to the rehab unit and the therapies. This worker feels she would benefit from seeing neuro-psych while here for coping. Will ask for input from therapy team. Substance Abuse History: tobacco-aware better for her health if she stops smoking, she will try is aware of the resources available for this.   Patient / Family Perceptions, Expectations & Goals Pt/Family understanding of illness & functional limitations: Pt and son in-law here to observe today have a basic understanding of her stroke and deficits. They have spoken with the MD and feel their questions and concerns have been addressed.  Premorbid pt/family roles/activities: Mom, grandmother, great grandmother, retiree, church member, etc Anticipated changes in roles/activities/participation: resume Pt/family expectations/goals: Pt states: " I want to do well here and will do my best."  Son in-law states: " We all hope she does well she is one who wants to do for herself."  [employer]: Other (Comment) (been to [facility] after back surgery) Premorbid Home Care/DME Agencies: Other (Comment) (has DME from [id number]) Transportation available at discharge: family Resource referrals recommended: Neuropsychology, Support group (specify)  Discharge Planning Living Arrangements: Children, Other relatives Support Systems: Children, Parent, Other relatives, Friends/neighbors, Church/faith community Type of Residence: Private residence Insurance Resources: Multimedia programmer (specify) Primary school teacher) Financial Resources: Fish farm manager, Family Support Financial Screen Referred: No Living Expenses: Lives with family Money Management: Patient,  Family Does the patient have any problems obtaining your medications?: No Home Management: patient and daughter Patient/Family Preliminary Plans: Return home with family, they will work on a caregiver for the times they are working. She has enough family that between them they can provide 24 hr care, if this is needed. Will await team evaluations and work on a safe discharge plan. Social Work Anticipated Follow Up Needs: HH/OP, Support Group  Clinical Impression Pleasant female who is willing to work hard and push herself even if her back is acting up. She has suffered with chronic back pain. She is used to be the caregiver and is not used to this new role. Feel she would benefit form seeing neuro-psych while here for coping. Will await therapy evaluations and have informed family to plan for 24 hr care at discharge.  [name] 12/18/2016, 10:59 AM

## 2016-12-18 NOTE — Progress Notes (Signed)
Occupational Therapy Session Note  Patient Details  Name: Ellen Henry MRN: [id number] Date of Birth: December 12, 1945  Today's Date: 12/18/2016 OT Individual Time: 1500-1531 OT Individual Time Calculation (min): 31 min     Short Term Goals: Week 1:  OT Short Term Goal 1 (Week 1): Pt will maintain static standing balance for 1 minutes with min A at RW/ sink in prep for functional task OT Short Term Goal 2 (Week 1): Pt will recall hemi-dressing techniques 2 consecutive days with min questioning cues OT Short Term Goal 3 (Week 1): Pt will complete sit<>stand with overall Min A un preparation for LB dressing tasks  Skilled Therapeutic Interventions/Progress Updates:    Pt worked on Brewing technologist for the trunk and LUE during session.  Pt transferred to the mat with mod assist stand pivot.  Had her work on sitting with the LUE in weightbearing on the mat while having to reach across her body with the LUE to target.  Emphasis on maintaining active left trunk elongation while rotating and reaching as well as maintaining neutral head position.  Pt tends to lead movements with left cervical flexion during dynamic tasks.  Worked on having her relax her head during movements with attempts to use the LUE to transition from reaching back to midline.  Pt with occasional pain in the posterior neck during intervals.  It would subside with repositioning of her head and the LUE.  Pt transferred back to wheelchair with mod assist and returned to room to complete session.  Pt left next to bed with call button in reach.    Therapy Documentation Precautions:  Precautions Precautions: Fall Precaution Comments: Inattention to L arm and L sided weakness Restrictions Weight Bearing Restrictions: No  Pain: Pain Assessment Pain Assessment: Faces Faces Pain Scale: Hurts a little bit Pain Type: Acute pain Pain Location: Neck Pain Orientation: Posterior Pain Descriptors / Indicators: Cramping Pain  Onset: With Activity Pain Intervention(s): Repositioned ADL: See Function Navigator for Current Functional Status.   Therapy/Group: Individual Therapy  MCGUIRE,JAMES OTR/L 12/18/2016, 4:04 PM

## 2016-12-18 NOTE — Progress Notes (Addendum)
Occupational Therapy Assessment and Plan  Patient Details  Name: Ellen Henry MRN: [id number] Date of Birth: 08/17/1946  OT Diagnosis: hemiplegia affecting non-dominant side, muscle weakness (generalized) and pain in joint Rehab Potential: Rehab Potential (ACUTE ONLY): Good ELOS: 16-20   Today's Date: 12/18/2016 OT Individual Time: 1100-1200 OT Individual Time Calculation (min): 60 min      Problem List: Patient Active Problem List   Diagnosis Date Noted  . Benign essential HTN   . Slow transit constipation   . Chronic bilateral low back pain without sciatica   . Dysphagia, post-stroke   . 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 Left hemiparesis ([place])   . Generalized weakness   . Acute ischemic stroke ([place]) 12/15/2016  . Urinary retention 02/25/2015  . Primary osteoarthritis of left knee 02/15/2015  . Morbid obesity ([place]) 02/15/2015  . Essential hypertension, benign 09/07/2014  . Constipation 09/07/2014  . Hyperlipidemia 09/05/2014  . Gout 09/05/2014  . Acute blood loss anemia 09/05/2014  . Neuropathy ([place]) 09/05/2014  . UTI (urinary tract infection) 08/31/2014  . Radiculopathy 08/29/2014    Past Medical History:  Past Medical History:  Diagnosis Date  . Arthritis   . Dry skin   . GERD (gastroesophageal reflux disease)    "years ago", diet controlled  . Gout   . H/O pyelonephritis    as a child  . 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 Hypertension   . Neuropathy ([place])   . Pneumonia   . Radiculopathy   . Sleep apnea    does not use cpap   Past Surgical History:  Past Surgical History:  Procedure Laterality Date  . ABDOMINAL EXPOSURE N/A 08/29/2014   Procedure: ABDOMINAL EXPOSURE;  Surgeon: Rosetta Posner, MD;  Location: [facility];  Service: Vascular;  Laterality: N/A;  . ABDOMINAL HYSTERECTOMY    . ANTERIOR LUMBAR FUSION N/A 08/29/2014   Procedure: ANTERIOR LUMBAR FUSION 1 LEVEL;  Surgeon: Sinclair Ship, MD;  Location: [facility];  Service:  Orthopedics;  Laterality: N/A;  Lumbar 4-5 anterior lumbar interbody fusion with allograft and instrumentation.  . APPENDECTOMY    . BACK SURGERY  08/28/2014 and 08/29/2014   lumbar fusion  . CARPAL TUNNEL RELEASE Right    release done twice  . COLONOSCOPY    . JOINT REPLACEMENT    . REPLACEMENT TOTAL KNEE  2010   rigth knee  . TONSILLECTOMY    . TOTAL KNEE ARTHROPLASTY Left 02/15/2015   Procedure: TOTAL KNEE ARTHROPLASTY;  Surgeon: [name], MD;  Location: [facility];  Service: Orthopedics;  Laterality: Left;    Assessment & Plan Clinical Impression: Patient is a 71 y.o. year old female with recent admission to the hospital on 12/14/16 with complaints of diffuse weakness. She was found to have left facial weakness on evaluation by EMS and symptoms resolved in ED therefore tPA not administered. CT head negative. She developed left sided weakness later that evening and CTA head/neck without perfusion abnormality or occlusion/high grade stenosis. Therefore not endovascular candidate. 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 She was started on ASA for secondary stroke prevention and Work up initiated. Patient transferred to CIR on 12/17/2016 .    Patient currently requires max A wiith basic self-care skills secondary to muscle weakness, abnormal tone, unbalanced muscle activation, decreased coordination and decreased motor planning and decreased sitting balance, decreased standing balance, decreased postural control, hemiplegia and decreased balance strategies.  Prior  to hospitalization, patient could complete ADL and iADL with independent .  Patient will benefit from skilled intervention to increase independence with basic self-care skills prior to discharge home with care partner.  Anticipate patient will require 24 hour supervision and follow up home health.  OT - End of Session Endurance Deficit: Yes Endurance Deficit Description: Decreased endurance  for functional tasks OT Assessment Rehab Potential (ACUTE ONLY): Good OT Patient demonstrates impairments in the following area(s): Balance;Endurance;Motor;Safety;Sensory OT Basic ADL's Functional Problem(s): Grooming;Toileting;Dressing;Bathing OT Transfers Functional Problem(s): Toilet;Tub/Shower OT Additional Impairment(s): Fuctional Use of Upper Extremity OT Plan OT Intensity: Minimum of 1-2 x/day, 45 to 90 minutes OT Frequency: 5 out of 7 days OT Duration/Estimated Length of Stay: 16-20 OT Treatment/Interventions: Balance/vestibular training;Community reintegration;Discharge planning;DME/adaptive equipment instruction;Functional mobility training;Neuromuscular re-education;Pain management;Patient/family education;Self Care/advanced ADL retraining;Therapeutic Activities;Therapeutic Exercise;UE/LE Strength taining/ROM;UE/LE Coordination activities;Wheelchair propulsion/positioning OT Basic Self-Care Anticipated Outcome(s): Supervision OT Toileting Anticipated Outcome(s): Supervision OT Bathroom Transfers Anticipated Outcome(s): Supervision OT Recommendation Recommendations for Other Services: Therapeutic Recreation consult Therapeutic Recreation Interventions: [place] group Patient destination: Home Follow Up Recommendations: Home health OT Equipment Recommended: To be determined   Skilled Therapeutic Intervention Initial eval completed with treatment provided to address functional transfers, improved sit<>stand, functional use of L UE, and adapted bathing/dressing skills. Stand-pivot transfers with overall Mod A. Showering completed with forced use of L UE during bathing/dressing tasks. Pt with history of back and knee surgeries making bending painful for LB dressing. Hemi-walker used for sit<>stands w/ Mod A, and Mod/Max A to maintain dynamic standing balance when pulling pants over hips. Pt with trace muscle activation in L UE >proximal>distal. Pt left seated in recliner at end of  session with needs met.   OT Evaluation Precautions/Restrictions  Precautions Precautions: Fall Precaution Comments: Inattention to L arm and L sided weakness Restrictions Weight Bearing Restrictions: No Pain Pain Assessment Pain Score: 3  Pain Type: Chronic pain Pain Location: Back Pain Descriptors / Indicators: Aching Pain Intervention(s): Repositioned) Home Living/Prior Functioning Home Living Living Arrangements: Children, Other relatives Available Help at Discharge: Family, Available PRN/intermittently Type of Home: House Home Access: Stairs to enter Technical brewer of Steps:  (3) Entrance Stairs-Rails: None Home Layout: One level Bathroom Shower/Tub: Chiropodist: Standard Bathroom Accessibility: Yes Additional Comments: Most details of home environment were taken from a previous admission. Poor historian at time of eval.   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) Comments:  (Per pt she was very independent PTA) ADL ADL ADL Comments: please see functional navigator Vision/Perception  Vision- History Patient Visual Report: No change from baseline Vision- Assessment Vision Assessment?: No apparent visual deficits  Cognition Overall Cognitive Status: Within Functional Limits for tasks assessed Arousal/Alertness: Awake/alert Orientation Level: Person;Place;Situation Person: Oriented Place: Oriented Situation: Oriented Year: 2018 Month: January Day of Week: Correct Memory: Appears intact Immediate Memory Recall: Sock;Bed;Blue Memory Recall: Sock;Blue;Bed Memory Recall Sock: Without Cue Memory Recall Blue:  Without Cue Memory Recall Bed: Without Cue Attention: Focused;Sustained Focused Attention: Appears intact Sustained Attention: Appears intact Awareness: Appears intact Problem Solving: Appears intact Safety/Judgment: Appears intact Sensation Sensation Light Touch: Appears Intact Additional Comments:  (reports hypersensitivity to light touch in L LE) Coordination Gross Motor Movements are Fluid and Coordinated: No Fine Motor Movements are Fluid and Coordinated: No Coordination  and Movement Description: Decreased ability to advance L LE during gait and decreased ability to utilize L UE functionally Trunk/Postural Assessment  Postural Control Postural Control: Deficits on evaluation (Decreased orientation to midline with decreased weight bearing on L LE)  Balance Balance Balance Assessed: Yes Static Sitting Balance Static Sitting - Level of Assistance: 5: Stand by assistance Dynamic Sitting Balance Dynamic Sitting - Level of Assistance: 5: Stand by assistance Sitting balance - Comments: required mod v/c for upright posture during upper body dressing.  Static Standing Balance Static Standing - Level of Assistance: 4: Min assist Dynamic Standing Balance Dynamic Standing - Level of Assistance: 3: Mod assist Extremity/Trunk Assessment RUE Assessment RUE Assessment: Within Functional Limits LUE Assessment LUE Assessment: Within Functional Limits LUE AROM (degrees) Overall AROM Left Upper Extremity: Deficits LUE Strength LUE Overall Strength: Deficits LUE Overall Strength Comments:  L finger/wrist ext:  0/5; L shoulder abd: 1/5  See Function Navigator for Current Functional Status.   Refer to Care Plan for Long Term Goals  Recommendations for other services: Therapeutic Recreation  Kitchen group   Discharge Criteria: Patient will be discharged from OT if patient refuses treatment 3 consecutive times without medical reason, if treatment goals not met, if there is a change in  medical status, if patient makes no progress towards goals or if patient is discharged from hospital.  The above assessment, treatment plan, treatment alternatives and goals were discussed and mutually agreed upon: by patient  [name] 12/18/2016, 12:47 PM

## 2016-12-18 NOTE — Evaluation (Signed)
Speech Language Pathology Assessment and Plan  Patient Details  Name: Ellen Henry MRN: [id number] Date of Birth: [birth year]  SLP Diagnosis: Dysphagia  Rehab Potential: Excellent ELOS: 16-20 days for PT/OT; will likely only need 7-10 days for SLP     Today's Date: 12/18/2016 SLP Individual Time: 1405-1500 SLP Individual Time Calculation (min): 55 min    Problem List: Patient Active Problem List   Diagnosis Date Noted  . Benign essential HTN   . Slow transit constipation   . Chronic bilateral low back pain without sciatica   . Dysphagia, post-stroke   . 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 Left hemiparesis ([place])   . Generalized weakness   . Acute ischemic stroke ([place]) 12/15/2016  . Urinary retention 02/25/2015  . Primary osteoarthritis of left knee 02/15/2015  . Morbid obesity ([place]) 02/15/2015  . Essential hypertension, benign 09/07/2014  . Constipation 09/07/2014  . Hyperlipidemia 09/05/2014  . Gout 09/05/2014  . Acute blood loss anemia 09/05/2014  . Neuropathy ([place]) 09/05/2014  . UTI (urinary tract infection) 08/31/2014  . Radiculopathy 08/29/2014   Past Medical History:  Past Medical History:  Diagnosis Date  . Arthritis   . Dry skin   . GERD (gastroesophageal reflux disease)    "years ago", diet controlled  . Gout   . H/O pyelonephritis    as a child  . 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 Hypertension   . Neuropathy ([place])   . Pneumonia   . Radiculopathy   . Sleep apnea    does not use cpap   Past Surgical History:  Past Surgical History:  Procedure Laterality Date  . ABDOMINAL EXPOSURE N/A 08/29/2014   Procedure: ABDOMINAL EXPOSURE;  Surgeon: Rosetta Posner, MD;  Location: [facility];  Service: Vascular;  Laterality: N/A;  . ABDOMINAL HYSTERECTOMY    . ANTERIOR LUMBAR FUSION N/A 08/29/2014   Procedure: ANTERIOR LUMBAR FUSION 1 LEVEL;  Surgeon: Sinclair Ship, MD;  Location: [facility];  Service: Orthopedics;  Laterality: N/A;  Lumbar  4-5 anterior lumbar interbody fusion with allograft and instrumentation.  . APPENDECTOMY    . BACK SURGERY  08/28/2014 and 08/29/2014   lumbar fusion  . CARPAL TUNNEL RELEASE Right    release done twice  . COLONOSCOPY    . JOINT REPLACEMENT    . REPLACEMENT TOTAL KNEE  2010   rigth knee  . TONSILLECTOMY    . TOTAL KNEE ARTHROPLASTY Left 02/15/2015   Procedure: TOTAL KNEE ARTHROPLASTY;  Surgeon: [name], MD;  Location: [facility];  Service: Orthopedics;  Laterality: Left;    Assessment / Plan / Recommendation Clinical Impression   Ellen Henry is a 71 y.o. female with history of HTN, chronic back pain with radiculopathy, OSA who was admitted on 12/14/16 with complaints of diffuse weakness. She was found to have left facial weakness on evaluation by EMS and symptoms resolved in ED therefore tPA not administered. CT head negative. She developed left sided weakness later that evening and CTA head/neck without perfusion abnormality or occlusion/high grade stenosis. Therefore not endovascular candidate. 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 Pt admitted to CIR on 12/17/2016.  SLP evaluation completed on 12/18/2016 with the following results:  Pt presents with a mild orally based dysphagia resulting from left sided oral motor weakness.  Pt with slowed but effective mastication of advanced solids and no overt s/s of aspiration with solids or liquids.  As a  result, recommend diet progression to dys 3 textures and thin liquids at this time with intermittent supervision for use of swallowing precautions.   Pt also presents with mildly imprecise articulation of consonants due to the abovementioned oral motor deficits, but this does not impact intelligibility in conversations and is not worrisome to pt.   Additionally, pt presents with intact expressive and receptive language skills.  Cognition appears grossly intact although not formally assessed on this date.    Skilled Therapeutic Interventions          Cognitive-linguistic and bedside swallow evaluation completed with results and recommendations reviewed with patient.   SLP briefly reviewed compensatory intelligibility strategies and provided pt with handout given that she reports that her speech sometimes sounds "off."  All questions were answered to her satisfaction at this time regarding speech and language function; as a result, no further ST needs are indicated for dysarthria, receptive or expressive language.  SLP also discussed recommendations for diet upgrade given that pt now has dentures and they appear to fit well.  Pt verbalized understanding and was in agreement with recommended plan of care.  Pt utilized swallowing precautions with distant supervision verbal cues when consuming advanced textures. Pt was left in wheelchair with call bell within reach and family at bedside.      SLP Assessment  Patient will need skilled Speech Lanaguage Pathology Services during CIR admission    Recommendations  SLP Diet Recommendations: Dysphagia 3 (Mech soft);Thin Liquid Administration via: Cup;Straw Medication Administration: Whole meds with liquid Supervision: Patient able to self feed;Intermittent supervision to cue for compensatory strategies Compensations: Slow rate;Small sips/bites Postural Changes and/or Swallow Maneuvers: Seated upright 90 degrees Oral Care Recommendations: Oral care BID Patient destination: Home Follow up Recommendations: None Equipment Recommended: None recommended by SLP    SLP Frequency 3 to 5 out of 7 days   SLP Duration  SLP Intensity  SLP Treatment/Interventions 16-20 days for PT/OT; will likely only need 7-10 days for SLP   Minumum of 1-2 x/day, 30 to 90 minutes  Cueing hierarchy;Dysphagia/aspiration precaution training;Internal/external aids;Patient/family education    Pain Pain Assessment Pain Assessment: No/denies pain  Prior  Functioning Cognitive/Linguistic Baseline: Within functional limits Type of Home: House  Lives With: Daughter Available Help at Discharge: Family;Available 24 hours/day Vocation: Retired  Function:  Eating Eating   Modified Consistency Diet: Yes Eating Assist Level: More than reasonable amount of time;Set up assist for   Eating Set Up Assist For: Opening containers       Cognition Comprehension Comprehension assist level: Understands complex 90% of the time/cues 10% of the time  Expression   Expression assist level: Expresses complex 90% of the time/cues < 10% of the time  Social Interaction Social Interaction assist level: Interacts appropriately with others with medication or extra time (anti-anxiety, antidepressant).  Problem Solving Problem solving assist level: Solves basic 90% of the time/requires cueing < 10% of the time  Memory Memory assist level: More than reasonable amount of time   Short Term Goals: Week 1: SLP Short Term Goal 1 (Week 1): Pt will consume dys 3 textures and thin liquids with mod I use of swallowing precautions over 2 targeted sessions.   SLP Short Term Goal 2 (Week 1): Pt will consume therapeutic trials of regular textures with mod I use of swallowing precautions over 2 targeted sessions prior to advancement.   Refer to Care Plan for Long Term Goals  Recommendations for other services: None   Discharge Criteria: Patient will be  discharged from SLP if patient refuses treatment 3 consecutive times without medical reason, if treatment goals not met, if there is a change in medical status, if patient makes no progress towards goals or if patient is discharged from hospital.  The above assessment, treatment plan, treatment alternatives and goals were discussed and mutually agreed upon: by patient  Emilio Math 12/18/2016, 4:39 PM

## 2016-12-18 NOTE — Progress Notes (Signed)
Patient information reviewed and entered into eRehab system by Callyn Noel, RN, CRRN, PPS Coordinator.  Information including medical coding and functional independence measure will be reviewed and updated through discharge.     Per nursing patient was given "Data Collection Information Summary for Patients in Inpatient Rehabilitation Facilities with attached "Privacy Act Statement-Health Care Records" upon admission.  

## 2016-12-18 NOTE — Care Management Note (Signed)
Inpatient [facility] Individual Statement of Services  Patient Name:  Ellen Henry  Date:  12/18/2016  Welcome to the [facility].  Our goal is to provide you with an individualized program based on your diagnosis and situation, designed to meet your specific needs.  With this comprehensive rehabilitation program, you will be expected to participate in at least 3 hours of rehabilitation therapies Monday-Friday, with modified therapy programming on the weekends.  Your rehabilitation program will include the following services:  Physical Therapy (PT), Occupational Therapy (OT), Speech Therapy (ST), 24 hour per day rehabilitation nursing, Therapeutic Recreaction (TR), Neuropsychology, Case Management (Social Worker), Rehabilitation Medicine, Nutrition Services and Pharmacy Services  Weekly team conferences will be held on Wednesday to discuss your progress.  Your Social Worker will talk with you frequently to get your input and to update you on team discussions.  Team conferences with you and your family in attendance may also be held.  Expected length of stay: 16-18 days Overall anticipated outcome: supervision set-up  Depending on your progress and recovery, your program may change. Your Social Worker will coordinate services and will keep you informed of any changes. Your Social Worker's name and contact numbers are listed  below.  The following services may also be recommended but are not provided by the [facility] will be made to provide these services after discharge if needed.  Arrangements include referral to agencies that provide these services.  Your insurance has been verified to be:  [employer]-Medicare Your primary doctor is:  Lucianne Lei  Pertinent information will be shared with your doctor and your insurance  company.  Social Worker:  Ovidio Kin, [place] or ([phone]  Information discussed with and copy given to patient by: [name], 12/18/2016, 10:40 AM

## 2016-12-19 ENCOUNTER — Inpatient Hospital Stay (HOSPITAL_COMMUNITY): Payer: Self-pay | Admitting: Occupational Therapy

## 2016-12-19 ENCOUNTER — Inpatient Hospital Stay (HOSPITAL_COMMUNITY): Payer: Self-pay | Admitting: Physical Therapy

## 2016-12-19 ENCOUNTER — Inpatient Hospital Stay (HOSPITAL_COMMUNITY): Payer: Medicare Other | Admitting: Physical Therapy

## 2016-12-19 ENCOUNTER — Ambulatory Visit (HOSPITAL_COMMUNITY): Payer: Self-pay | Admitting: Speech Pathology

## 2016-12-19 ENCOUNTER — Inpatient Hospital Stay (HOSPITAL_COMMUNITY): Payer: Medicare Other | Admitting: Speech Pathology

## 2016-12-19 DIAGNOSIS — M5410 Radiculopathy, site unspecified: Secondary | ICD-10-CM

## 2016-12-19 DIAGNOSIS — R0989 Other specified symptoms and signs involving the circulatory and respiratory systems: Secondary | ICD-10-CM

## 2016-12-19 NOTE — Progress Notes (Signed)
Occupational Therapy Session Note  Patient Details  Name: Ellen Henry MRN: [id number] Date of Birth: 12/30/1945  Today's Date: 12/19/2016 OT Individual Time: [id number] OT Individual Time Calculation (min): 61 min    Short Term Goals: Week 1:  OT Short Term Goal 1 (Week 1): Pt will maintain static standing balance for 1 minutes with min A at RW/ sink in prep for functional task OT Short Term Goal 2 (Week 1): Pt will recall hemi-dressing techniques 2 consecutive days with min questioning cues OT Short Term Goal 3 (Week 1): Pt will complete sit<>stand with overall Min A un preparation for LB dressing tasks  Skilled Therapeutic Interventions/Progress Updates:   Pt was sitting in w/c at time of arrival, requesting to shower. Tx focus on L NMR, left inattention, standing endurance, AE use, and balance. Pt ambulated into bathroom with hemi walker to shower bench with overall Mod A and extra time for L LE clearance. Chair placed halfway for rest break if needed (which was not). Bathing completed with [facility] assistance for integrating L UE. Assist required for feet, pt may benefit from [place] sponge. Afterwards pt reported feeling motivated to ambulate back to w/c instead of completing stand pivot shower bench<w/c. She ambulated to sink in manner as written above and experienced 1 anterior LOB with resulting fall with OT assisting with control during descent. Pt appeared to have no observable injuries, reported feeling no pain. RN notified and mechanical lift utilized to transfer pt back into w/c with 3 assist. Vitals assessed and WFL per report. Pt reported feeling well enough to resume dressing. [place] trialed with pt able to use for R LE. She was able to don shirt with supervision and mod instruction on hemi techniques. Pt left at sink to complete grooming tasks with safety belt donned and call button within reach. Educated on pressing call button when finished for nursing to assist with repositioning. Pt  verbalized understanding. RN made aware of pts position at time of departure.    Therapy Documentation Precautions:  Precautions Precautions: Fall Precaution Comments: Inattention to L arm and L sided weakness Restrictions Weight Bearing Restrictions: No   Vital Signs: Therapy Vitals Temp: 98.4 F (36.9 C) Temp Source: Oral Pulse Rate: 97 Resp: 18 BP: 118/82 Patient Position (if appropriate): Sitting Oxygen Therapy SpO2: 98 % O2 Device: Not Delivered Pain: Pain Assessment Pain Assessment: 0-10 Pain Score: 5  Pain Type: Chronic pain Pain Location: Back Pain Orientation: Lower Pain Descriptors / Indicators: Constant Pain Frequency: Constant Pain Onset: On-going Patients Stated Pain Goal: 1 Pain Intervention(s):  (already medicated prior to fall, kpad at bedside) ADL: ADL ADL Comments: please see functional navigator:    See Function Navigator for Current Functional Status.   Therapy/Group: Individual Therapy  Michaela A Hoffman 12/19/2016, 12:23 PM

## 2016-12-19 NOTE — Progress Notes (Signed)
[facility] PHYSICAL MEDICINE & REHABILITATION     PROGRESS NOTE  Subjective/Complaints:  Pt seen laying in bed this AM.  She states she had a rought night because she could not go back to sleep after waking up to urinate. She had a "wonderful" first day of therapies.   ROS:  Denies CP, SOB, N/V/D.  Objective: Vital Signs: Blood pressure 139/82, pulse 72, temperature 97.6 F (36.4 C), temperature source Oral, resp. rate 18, height 5' (1.524 m), weight 103.2 kg (227 lb 9.6 oz), SpO2 97 %. No results found.  Recent Labs  12/17/16 0636 12/18/16 0634  WBC 9.0 8.2  HGB 13.0 13.9  HCT 38.8 41.3  PLT 253 211    Recent Labs  12/17/16 0636 12/18/16 0634  NA 143 141  K 3.3* 4.4  CL 108 108  GLUCOSE 106* 96  BUN 6 7  CREATININE 0.65 0.71  CALCIUM 9.3 9.8   CBG (last 3)  No results for input(s): GLUCAP in the last 72 hours.  Wt Readings from Last 3 Encounters:  12/17/16 103.2 kg (227 lb 9.6 oz)  12/15/16 104.3 kg (230 lb)  03/10/15 108.9 kg (240 lb)    Physical Exam:  BP 139/82 (BP Location: Right Arm)   Pulse 72   Temp 97.6 F (36.4 C) (Oral)   Resp 18   Ht 5' (1.524 m)   Wt 103.2 kg (227 lb 9.6 oz)   SpO2 97%   BMI 44.45 kg/m  Constitutional: She appears well-developed and well-nourished.  HENT: Normocephalic and atraumatic.  Eyes: EOMI. No discharge.  Cardiovascular: RRR. No JVD. Respiratory: Effort normal and breath sounds normal.  GI: Soft. Bowel sounds are normal.  Musculoskeletal: She exhibits no edema or tenderness.  Neurological: She is alert and oriented.  Left facial droop with mild dysarthria.  Reasonable insight and awareness into deficits.  Motor: LUE: deltoid 2-/5, distally 1/5 (stable) LLE: 1/5 HF, distally 0/5 Skin: Skin is warm and dry. Intact.   Assessment/Plan: 1. 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 Physiatrist is providing close team supervision and 24 hour management of active medical problems listed below. Physiatrist and rehab team continue to assess barriers to discharge/monitor patient progress toward functional and medical goals.  Function:  Bathing Bathing position Bathing activity did not occur:  (pt had shower on day shift) Position: Shower  Bathing parts Body parts bathed by patient: Left arm, Chest, Abdomen, Front perineal area, Right upper leg, Left upper leg Body parts bathed by helper: Right lower leg, Left lower leg, Buttocks, Back  Bathing assist Assist Level: Touching or steadying assistance(Pt > 75%)      Upper Body Dressing/Undressing Upper body dressing   What is the patient wearing?: [place] over shirt/dress - Perfomed by patient: Thread/unthread right sleeve, Put head through opening Pull over shirt/dress - Perfomed by helper: Thread/unthread left sleeve, Pull shirt over trunk        Upper body assist Assist Level: Touching or steadying assistance(Pt > 75%)      Lower Body Dressing/Undressing Lower body dressing   What is the patient wearing?: Non-skid slipper socks, Pants, Underwear   Underwear - Performed by helper: Thread/unthread right underwear leg, Thread/unthread left underwear leg, Pull underwear up/down   Pants- Performed by helper: Thread/unthread left pants leg, Thread/unthread right pants leg, Pull pants up/down, Fasten/unfasten pants   Non-skid slipper  socks- Performed by helper: Don/doff left sock, Don/doff right sock                  Lower body assist Assist for lower body dressing:  (Total A)      Toileting Toileting   Toileting steps completed by patient: Adjust clothing prior to toileting Toileting steps completed by helper: Performs perineal hygiene    Toileting assist Assist level: Two helpers   Transfers Chair/bed transfer   Chair/bed transfer method: Stand pivot (hemi walker) Chair/bed  transfer assist level: Moderate assist (Pt 50 - 74%/lift or lower) Chair/bed transfer assistive device: Bedrails, Armrests, Walker (hemi-walker)     Locomotion Ambulation     Max distance:  (5 ft) Assist level: Moderate assist (Pt 50 - 74%)   Wheelchair   Type: Manual   Assist Level: Touching or steadying assistance (Pt > 75%)  Cognition Comprehension Comprehension assist level: Understands complex 90% of the time/cues 10% of the time  Expression Expression assist level: Expresses complex 90% of the time/cues < 10% of the time  Social Interaction Social Interaction assist level: Interacts appropriately with others with medication or extra time (anti-anxiety, antidepressant).  Problem Solving Problem solving assist level: Solves basic 90% of the time/requires cueing < 10% of the time  Memory Memory assist level: More than reasonable amount of time     Medical Problem List and Plan:  1. 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  MRI reviewed, limited, see above  Cont CIR  Bracing ordered  Fluoxetine started 1/12 2. DVT Prophylaxis/Anticoagulation: Pharmaceutical: Lovenox  3. Back pain/RLE neuropathy/Pain Management:   Low dose neurontin and titrate upwards, pt states this helps tremendously.   Robaxin PRN started 1/12 4. Mood: team to provide ego support. LCSW to follow for evaluation and support.  5. Neuropsych: This patient is capable of making decisions on her own behalf.  6. Skin/Wound Care: routine pressure relief measures.  7. Fluids/Electrolytes/Nutrition: Monitor I/O.   Dysphag 2 thin, advance as tolerated.  BMP within acceptable range 1/12 8. [id number] BP bid  Labile, but appears to be improving, will consider meds if necessary 9. Urinary retention: Improving  UA neg, Ucx NG.   Scheduled toileting--monitor voiding with PVR neg.  10. Chronic constipation: Uses suppository every other day. Added senna S at bedtime with  suppository daily as needed.  11. Hypokalemia : Likely dilutional-- supplemented 1/11.   WNL on 1/12 12. Dyslipidemia: On zocor  13. Tobacco abuse: Nicotine patch. Encourage cessation.  14. H/o Gout: Last flare past fall. Monitor for now.   LOS (Days) 2 A FACE TO FACE EVALUATION WAS PERFORMED  Ankit Lorie Phenix 12/19/2016 9:29 AM

## 2016-12-19 NOTE — Progress Notes (Signed)
Physical Therapy Session Note  Patient Details  Name: Ellen Henry MRN: [id number] Date of Birth: 11/03/1946  Today's Date: 12/19/2016 PT Individual Time: 1300-1400 PT Individual Time Calculation (min): 60 min    Short Term Goals: Week 1:  PT Short Term Goal 1 (Week 1): Pt will perform bed mobility and supine to sit with modified independence. PT Short Term Goal 2 (Week 1): Pt will ambulate 25 ft wtih least restrictive device and min assist. PT Short Term Goal 3 (Week 1): Pt will perform mat to chair and sit to stand with min assist. PT Short Term Goal 4 (Week 1): Pt will stand while performing upper extremity task with min assist for balance.  Skilled Therapeutic Interventions/Progress Updates:     Pt received sitting in WC and agreeable to PT  Pt performed WC mobility through hall x 174f with Hemi technique and min cues to maintain straight trajectory.  Supine therex to improved LLE activation and neuromotor control.  LAQ 2x 12 BLE isometic hip adduction x 10 BLE Holding LLE in Static/neutral hip add/abdcution 2 x 1 minute Bridges x 10 with 2 second hold  Heel slides 10x 2 on maxi slide with AAROM to stability.  SLR x 10 on the RLE and x 7 on the LLE with AAROM.   Supine LUE Horizontal Adduction within available range with AAROM Blocked practice to touch chin with AAROM from PT.   Throughout supine NMR PT provided min assist of improved quality of movement as well as min cues for decreased compensation from Trunk with LE and from L trap on UE.   Supine<>sit transfers with min assist from PT to assist with control of the LLE and to stabilize the LUE and force use to sitting position.   Toilet transfer with min assist and heavy use of rails. PT required to manage lower body clothing.   [facility] too room [facility] and all needs met.      Therapy Documentation Precautions:  Precautions Precautions: Fall Precaution Comments:  Inattention to L arm and L sided weakness Restrictions Weight Bearing Restrictions: No Pain: 0/10   See Function Navigator for Current Functional Status.   Therapy/Group: Individual Therapy  ALorie Phenix1/13/2018, 2:05 PM

## 2016-12-19 NOTE — Progress Notes (Signed)
Speech Language Pathology Daily Session Note  Patient Details  Name: Ellen Henry MRN: [id number] Date of Birth: 1946-10-09  Today's Date: 12/19/2016 SLP Individual Time: 1015-1100 SLP Individual Time Calculation (min): 45 min   Short Term Goals: Week 1: SLP Short Term Goal 1 (Week 1): Pt will consume dys 3 textures and thin liquids with mod I use of swallowing precautions over 2 targeted sessions.   SLP Short Term Goal 2 (Week 1): Pt will consume therapeutic trials of regular textures with mod I use of swallowing precautions over 2 targeted sessions prior to advancement.   Skilled Therapeutic Interventions: Skilled treatment session focused on tolerance of recent diet upgrade. Patient consumed a snack of Dys. 3 textures with thin liquids without overt s/s of aspiration. Patient demonstrated efficient mastication and utilized intermittent liquid washes to clear oral residue. Recommend patient continue current diet. SLP also facilitated session with diagnostic treatment of patient's current cognitive function. Patient completed a basic money management and medication management task with extra time and supervision verbal cues to self-monitor and correct errors. Patient left upright in wheelchair with quick release belt in place and all needs within reach. Continue with current plan of care.   Function:  Eating Eating   Modified Consistency Diet: Yes Eating Assist Level: More than reasonable amount of time;Set up assist for   Eating Set Up Assist For: Opening containers       Cognition Comprehension Comprehension assist level: Understands complex 90% of the time/cues 10% of the time  Expression   Expression assist level: Expresses complex 90% of the time/cues < 10% of the time  Social Interaction Social Interaction assist level: Interacts appropriately with others with medication or extra time (anti-anxiety, antidepressant).  Problem Solving Problem solving assist level: Solves basic  90% of the time/requires cueing < 10% of the time  Memory Memory assist level: More than reasonable amount of time    Pain No/Denies Pain   Therapy/Group: Individual Therapy  PAYNE, COURTNEY 12/19/2016, 3:19 PM

## 2016-12-19 NOTE — Progress Notes (Signed)
Physical Therapy Session Note  Patient Details  Name: Ellen Henry MRN: [id number] Date of Birth: [birth year]  Today's Date: 12/19/2016 PT Individual Time: 0800-0830 PT Individual Time Calculation (min): 30 min    Short Term Goals: Week 1:  PT Short Term Goal 1 (Week 1): Pt will perform bed mobility and supine to sit with modified independence. PT Short Term Goal 2 (Week 1): Pt will ambulate 25 ft wtih least restrictive device and min assist. PT Short Term Goal 3 (Week 1): Pt will perform mat to chair and sit to stand with min assist. PT Short Term Goal 4 (Week 1): Pt will stand while performing upper extremity task with min assist for balance.  Skilled Therapeutic Interventions/Progress Updates:    Pt was seen bedside in the am. Pt transferred supine to edge of bed with head of bed elevated, side rails and S with increased time. Pt transferred edge of bed to w/c with stand pivot and min to mod A with verbal cues. Pt ambulated 3 feet and 10 feet with hemiwalker and mod A with verbal and tactile cues for safety and sequencing. Pt performed block practice sit to stand transfers, 2 sets x 5 reps each with min to mod A and verbal cues. Pt left sitting up in /wc with quick release belt in place and call bell within reach.   Therapy Documentation Precautions:  Precautions Precautions: Fall Precaution Comments: Inattention to L arm and L sided weakness Restrictions Weight Bearing Restrictions: No General:   Pain: Pt c/o chronic low back pain 8/10.   See Function Navigator for Current Functional Status.   Therapy/Group: Individual Therapy  Dub Amis 12/19/2016, 12:34 PM

## 2016-12-20 MED ORDER — GABAPENTIN 300 MG PO CAPS
300.0000 mg | ORAL_CAPSULE | Freq: Three times a day (TID) | ORAL | Status: DC
  Administered 2016-12-20 – 2016-12-22 (×9): 300 mg via ORAL
  Filled 2016-12-20 (×9): qty 1

## 2016-12-20 NOTE — Progress Notes (Signed)
Complained of "nerve" pain to left leg. Restless night. Ellen Henry A

## 2016-12-20 NOTE — Progress Notes (Signed)
[facility] PHYSICAL MEDICINE & REHABILITATION     PROGRESS NOTE  Subjective/Complaints:  Pt seen laying in bed this AM.  Per nursing, pt complaining of radicular pain.  Pt state she slept fairly, but does not have some radicular pain.  ROS:  +Radicular pain. Denies CP, SOB, N/V/D.  Objective: Vital Signs: Blood pressure (!) 144/98, pulse 83, temperature 97.7 F (36.5 C), temperature source Oral, resp. rate 16, height 5' (1.524 m), weight 103.2 kg (227 lb 9.6 oz), SpO2 97 %. No results found.  Recent Labs  12/18/16 0634  WBC 8.2  HGB 13.9  HCT 41.3  PLT 211    Recent Labs  12/18/16 0634  NA 141  K 4.4  CL 108  GLUCOSE 96  BUN 7  CREATININE 0.71  CALCIUM 9.8   CBG (last 3)  No results for input(s): GLUCAP in the last 72 hours.  Wt Readings from Last 3 Encounters:  12/17/16 103.2 kg (227 lb 9.6 oz)  12/15/16 104.3 kg (230 lb)  03/10/15 108.9 kg (240 lb)    Physical Exam:  BP (!) 144/98 (BP Location: Right Arm)   Pulse 83   Temp 97.7 F (36.5 C) (Oral)   Resp 16   Ht 5' (1.524 m)   Wt 103.2 kg (227 lb 9.6 oz)   SpO2 97%   BMI 44.45 kg/m  Constitutional: She appears well-developed and well-nourished.  HENT: Normocephalic and atraumatic.  Eyes: EOMI. No discharge.  Cardiovascular: RRR. No JVD. Respiratory: Effort normal and breath sounds normal.  GI: Soft. Bowel sounds are normal.  Musculoskeletal: She exhibits no edema or tenderness.  Neurological: She is alert and oriented.  Left facial droop with mild dysarthria.  Reasonable insight and awareness into deficits.  Motor: LUE: deltoid 2-/5, distally 1/5 (unchanged) LLE: 1/5 HF, distally 0/5 Skin: Skin is warm and dry. Intact.   Assessment/Plan: 1. Functional deficits secondary to right post limb internal capsule infarct which require 3+ hours per day of interdisciplinary therapy in a comprehensive inpatient rehab setting. Physiatrist is providing close team supervision and 24 hour management of active  medical problems listed below. Physiatrist and rehab team continue to assess barriers to discharge/monitor patient progress toward functional and medical goals.  Function:  Bathing Bathing position Bathing activity did not occur:  (pt had shower on day shift) Position: Shower  Bathing parts Body parts bathed by patient: Left arm, Chest, Abdomen, Front perineal area, Right upper leg, Left upper leg, Buttocks Body parts bathed by helper: Back, Right lower leg, Left lower leg  Bathing assist Assist Level: Touching or steadying assistance(Pt > 75%)      Upper Body Dressing/Undressing Upper body dressing   What is the patient wearing?: Pull over shirt/dress, Bra Bra - Perfomed by patient: Thread/unthread right bra strap, Thread/unthread left bra strap Bra - Perfomed by helper: Hook/unhook bra (pull down sports bra) Pull over shirt/dress - Perfomed by patient: Thread/unthread right sleeve, Put head through opening, Thread/unthread left sleeve, Pull shirt over trunk Pull over shirt/dress - Perfomed by helper: Thread/unthread left sleeve, Pull shirt over trunk        Upper body assist Assist Level: Supervision or verbal cues      Lower Body Dressing/Undressing Lower body dressing   What is the patient wearing?: Underwear, Pants, Non-skid slipper socks Underwear - Performed by patient: Thread/unthread right underwear leg Underwear - Performed by helper: Thread/unthread left underwear leg, Pull underwear up/down Pants- Performed by patient: Thread/unthread right pants leg Pants- Performed by helper: Thread/unthread  left pants leg, Pull pants up/down   Non-skid slipper socks- Performed by helper: Don/doff right sock, Don/doff left sock                  Lower body assist Assist for lower body dressing:  (Max A. Reacher used.)      Child psychotherapist steps completed by patient: Adjust clothing prior to toileting Toileting steps completed by helper: Performs perineal  hygiene    Toileting assist Assist level: Two helpers   Transfers Chair/bed transfer   Chair/bed transfer method: Stand pivot Chair/bed transfer assist level: Moderate assist (Pt 50 - 74%/lift or lower) Chair/bed transfer assistive device: Walker, Armrests, Bedrails     Locomotion Ambulation     Max distance: 10 Assist level: Moderate assist (Pt 50 - 74%)   Wheelchair   Type: Manual   Assist Level: Touching or steadying assistance (Pt > 75%)  Cognition Comprehension Comprehension assist level: Understands complex 90% of the time/cues 10% of the time  Expression Expression assist level: Expresses complex 90% of the time/cues < 10% of the time  Social Interaction Social Interaction assist level: Interacts appropriately with others with medication or extra time (anti-anxiety, antidepressant).  Problem Solving Problem solving assist level: Solves basic 90% of the time/requires cueing < 10% of the time  Memory Memory assist level: More than reasonable amount of time     Medical Problem List and Plan:  1. 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  Cont CIR  Bracing ordered  Fluoxetine started 1/12 2. DVT Prophylaxis/Anticoagulation: Pharmaceutical: Lovenox  3. Back pain/RLE neuropathy/Pain Management:   Neurontin increased to 300 TID on 1/14  Robaxin PRN started 1/12 4. Mood: team to provide ego support. LCSW to follow for evaluation and support.  5. Neuropsych: This patient is capable of making decisions on her own behalf.  6. Skin/Wound Care: routine pressure relief measures.  7. Fluids/Electrolytes/Nutrition: Monitor I/O.   Dysphag 2 thin, advance as tolerated.  BMP within acceptable range 1/12 8. [id number] BP bid  Slightly elevated this AM, otherwise within good control 9. Urinary retention: Improving  UA neg, Ucx NG.   Scheduled toileting--monitor voiding with PVR neg.  10. Chronic constipation: Uses suppository every other  day. Added senna S at bedtime with suppository daily as needed.  11. Hypokalemia : Likely dilutional-- supplemented 1/11.   WNL on 1/12 12. Dyslipidemia: On zocor  13. Tobacco abuse: Nicotine patch. Encourage cessation.  14. H/o Gout: Last flare past fall. Monitor for now.   LOS (Days) 3 A FACE TO FACE EVALUATION WAS PERFORMED  Ankit Lorie Phenix 12/20/2016 7:55 AM

## 2016-12-21 ENCOUNTER — Inpatient Hospital Stay (HOSPITAL_COMMUNITY): Payer: Self-pay | Admitting: Occupational Therapy

## 2016-12-21 ENCOUNTER — Inpatient Hospital Stay (HOSPITAL_COMMUNITY): Payer: Medicare Other | Admitting: Speech Pathology

## 2016-12-21 ENCOUNTER — Inpatient Hospital Stay (HOSPITAL_COMMUNITY): Payer: Medicare Other

## 2016-12-21 ENCOUNTER — Inpatient Hospital Stay (HOSPITAL_COMMUNITY): Payer: Medicare Other | Admitting: Physical Therapy

## 2016-12-21 DIAGNOSIS — G894 Chronic pain syndrome: Secondary | ICD-10-CM

## 2016-12-21 DIAGNOSIS — R52 Pain, unspecified: Secondary | ICD-10-CM

## 2016-12-21 NOTE — Progress Notes (Signed)
Occupational Therapy Session Note  Patient Details  Name: Ellen Henry MRN: [id number] Date of Birth: Sep 28, 1946  Today's Date: 12/21/2016 OT Individual Time: 1100-1202 OT Individual Time Calculation (min): 62 min   Short Term Goals: Week 1:  OT Short Term Goal 1 (Week 1): Pt will maintain static standing balance for 1 minutes with min A at RW/ sink in prep for functional task OT Short Term Goal 2 (Week 1): Pt will recall hemi-dressing techniques 2 consecutive days with min questioning cues OT Short Term Goal 3 (Week 1): Pt will complete sit<>stand with overall Min A un preparation for LB dressing tasks  Skilled Therapeutic Interventions/Progress Updates:   1:1 OT session focused on modified bathing/dressing, functional use of L Ue, sit<>stands, and functional transfers. Stand-pivot transfer onto tub transfer bench w/ overall Mod A + grab bars. Provided pt with long-handled sponge for increased independence with LB bathing. Hand-over hand technique to integrate L UE with bathing. Pt required Min/Mod A for standing balance while washing buttocks and peri-area.Toilet transfers with Mod A, able to toilet with set-up A sitting. Dressing completed w/c level at the sink with instruction for use of reacher for LB dressing. Sit<>stands with overall Min/Mod A + hemi-walker. Pt left seated in w/c at end of session with needs met.   Therapy Documentation Precautions:  Precautions Precautions: Fall Precaution Comments: Inattention to L arm and L sided weakness Restrictions Weight Bearing Restrictions: No Pain: Pain Assessment Pain Assessment: 0-10 Pain Score: 8  Pain Type: Chronic pain Pain Location: Back Pain Orientation: Lower Pain Descriptors / Indicators: Aching Pain Onset: On-going Patients Stated Pain Goal: 3 Pain Intervention(s): Repositioned ADL: ADL ADL Comments: please see functional navigator  See Function Navigator for Current Functional Status.   Therapy/Group: Individual  Therapy  [name] 12/21/2016, 12:20 PM

## 2016-12-21 NOTE — Progress Notes (Signed)
Physical Therapy Session Note  Patient Details  Name: Ellen Henry MRN: [id number] Date of Birth: [birth year]  Today's Date: 12/21/2016 PT Individual Time: 1400-1530 PT Individual Time Calculation (min): 90 min    Short Term Goals: Week 1:  PT Short Term Goal 1 (Week 1): Pt will perform bed mobility and supine to sit with modified independence. PT Short Term Goal 2 (Week 1): Pt will ambulate 25 ft wtih least restrictive device and min assist. PT Short Term Goal 3 (Week 1): Pt will perform mat to chair and sit to stand with min assist. PT Short Term Goal 4 (Week 1): Pt will stand while performing upper extremity task with min assist for balance.  Skilled Therapeutic Interventions/Progress Updates:    Pt was found in bed with family present. Pt performed supine-to-sit at EOB with min assist via HOB elevated and use of bedrails. Sit-to-stand from EOB and ambulating 20 ft with hemiwalker to the bathroom required min assist with verbal cues for DME sequencing.   Pt required min assist with sit-to-stand on elevated commode and only with lifting pants after toileting on L side.   Pt required min assist to ambulated 8 ft with the hemiwalker and to sit in W/C to be transferred to gym.  Pt performed squat pivot from W/C onto rehab mat with min assist with hemiwalker.   Pt performed sit-to-stand transfers in rehab gym with no UE support and using standing mirror to provide NMR for LLE. (1x10 from elevated mat. 1x10 from standard chair. 1x10 from standard chair with RLE on 1 inch step to promote increased reliance on LLE for transfer). Pt required min assist with cues for foot placement, anterior trunk lean, sequencing, and posture.    Pt performed 2x10 reps of LUE AA horizontal abd/add and protraction/retraction on table to promote increased functional use of affect UE . Verbal and tactile cues for minimizing shoulder hiking/compensation.   Pt ambulated 20 ft min assist using a RW with handsplint  for LUE with max cues for gait sequence with walker and weight transfer to clear LLE.   Pt was provided RW with handsplint to use instead of hemiwalker due to increased stability with ambulation. Pt was left in W/C with all needs within reach after therapy.     Therapy Documentation Precautions:  Precautions Precautions: Fall Precaution Comments: Inattention to L arm and L sided weakness Restrictions Weight Bearing Restrictions: No General:   Pain: Pain Assessment Pain Assessment: 0-10 Pain Score: 8  Pain Type: Chronic pain Pain Location: Back Pain Orientation: Lower Pain Descriptors / Indicators: Aching Pain Onset: On-going Pain Intervention(s): Repositioned Multiple Pain Sites: No   See Function Navigator for Current Functional Status.   Therapy/Group: Individual Therapy  Rosendo Gros 12/21/2016, 4:15 PM

## 2016-12-21 NOTE — Progress Notes (Signed)
Speech Language Pathology Daily Session Note  Patient Details  Name: Ellen Henry MRN: [id number] Date of Birth: 12-Mar-1946  Today's Date: 12/21/2016 SLP Individual Time: 0930-1030 SLP Individual Time Calculation (min): 60 min   Short Term Goals: Week 1: SLP Short Term Goal 1 (Week 1): Pt will consume dys 3 textures and thin liquids with mod I use of swallowing precautions over 2 targeted sessions.   SLP Short Term Goal 2 (Week 1): Pt will consume therapeutic trials of regular textures with mod I use of swallowing precautions over 2 targeted sessions prior to advancement.  SLP Short Term Goal 3 (Week 1): Pt will solve mildly complex problems Mod I for errors correction    Skilled Therapeutic Interventions:   Skilled treatment session focused on addressing diagnostic treatment of cognition goals. Patient participated in money and medication management portions of the ALFA with Min verbal cues to recognize and correct errors with change making and word problems.  Patient Mod I with medication management.  SLP also facilitated session by providing Supervision level verbal cues to accurately complete a mildly complex scheduling task.  Goals modified to include a cognitive goal.    Function:  Cognition Comprehension Comprehension assist level: Understands complex 90% of the time/cues 10% of the time  Expression   Expression assist level: Expresses complex 90% of the time/cues < 10% of the time  Social Interaction Social Interaction assist level: Interacts appropriately with others with medication or extra time (anti-anxiety, antidepressant).  Problem Solving Problem solving assist level: Solves basic 90% of the time/requires cueing < 10% of the time  Memory Memory assist level: More than reasonable amount of time    Pain Pain Assessment Pain Assessment: 0-10 Pain Score: 8  Pain Type: Chronic pain Pain Location: Back Pain Orientation: Lower Pain Descriptors / Indicators:  Aching Pain Onset: On-going Patients Stated Pain Goal: 3 Pain Intervention(s): Repositioned, RN notified and administered pain medication   Therapy/Group: Individual Therapy  Carmelia Roller., [place] [id number]  [place] 12/21/2016, 12:31 PM

## 2016-12-21 NOTE — Progress Notes (Signed)
[facility] PHYSICAL MEDICINE & REHABILITATION     PROGRESS NOTE  Subjective/Complaints:  Pt laying in bed this AM.  She states she feels "much better".  She does note back pain taht has gotten worse.    ROS:  +Back pain. Denies CP, SOB, N/V/D.  Objective: Vital Signs: Blood pressure 127/80, pulse 74, temperature 97.8 F (36.6 C), temperature source Oral, resp. rate 16, height 5' (1.524 m), weight 103.2 kg (227 lb 9.6 oz), SpO2 98 %. No results found. No results for input(s): WBC, HGB, HCT, PLT in the last 72 hours. No results for input(s): NA, K, CL, GLUCOSE, BUN, CREATININE, CALCIUM in the last 72 hours.  Invalid input(s): CO CBG (last 3)  No results for input(s): GLUCAP in the last 72 hours.  Wt Readings from Last 3 Encounters:  12/17/16 103.2 kg (227 lb 9.6 oz)  12/15/16 104.3 kg (230 lb)  03/10/15 108.9 kg (240 lb)    Physical Exam:  BP 127/80 (BP Location: Right Arm)   Pulse 74   Temp 97.8 F (36.6 C) (Oral)   Resp 16   Ht 5' (1.524 m)   Wt 103.2 kg (227 lb 9.6 oz)   SpO2 98%   BMI 44.45 kg/m  Constitutional: She appears well-developed and well-nourished.  HENT: Normocephalic and atraumatic.  Eyes: EOMI. No discharge.  Cardiovascular: RRR. No JVD. Respiratory: Effort normal and breath sounds normal.  GI: Soft. Bowel sounds are normal.  Musculoskeletal: She exhibits no edema or tenderness.  Neurological: She is alert and oriented.  Left facial droop with mild dysarthria.  Reasonable insight and awareness into deficits.  Motor: LUE: deltoid 3-/5 proximal to distal LLE: 4-/5 HF, 3/5 KE, ADF/DP 4/5 Skin: Skin is warm and dry. Intact.   Assessment/Plan: 1. Functional deficits secondary to right post limb internal capsule infarct which require 3+ hours per day of interdisciplinary therapy in a comprehensive inpatient rehab setting. Physiatrist is providing close team supervision and 24 hour management of active medical problems listed below. Physiatrist and rehab  team continue to assess barriers to discharge/monitor patient progress toward functional and medical goals.  Function:  Bathing Bathing position Bathing activity did not occur:  (pt had shower on day shift) Position: Shower  Bathing parts Body parts bathed by patient: Left arm, Chest, Abdomen, Front perineal area, Right upper leg, Left upper leg, Buttocks Body parts bathed by helper: Back, Right lower leg, Left lower leg  Bathing assist Assist Level: Touching or steadying assistance(Pt > 75%)      Upper Body Dressing/Undressing Upper body dressing   What is the patient wearing?: Pull over shirt/dress, Bra Bra - Perfomed by patient: Thread/unthread right bra strap, Thread/unthread left bra strap Bra - Perfomed by helper: Hook/unhook bra (pull down sports bra) Pull over shirt/dress - Perfomed by patient: Thread/unthread right sleeve, Put head through opening, Thread/unthread left sleeve, Pull shirt over trunk Pull over shirt/dress - Perfomed by helper: Thread/unthread left sleeve, Pull shirt over trunk        Upper body assist Assist Level: Supervision or verbal cues      Lower Body Dressing/Undressing Lower body dressing   What is the patient wearing?: Underwear, Pants, Non-skid slipper socks Underwear - Performed by patient: Thread/unthread right underwear leg Underwear - Performed by helper: Thread/unthread left underwear leg, Pull underwear up/down Pants- Performed by patient: Thread/unthread right pants leg Pants- Performed by helper: Thread/unthread left pants leg, Pull pants up/down   Non-skid slipper socks- Performed by helper: Don/doff right sock, Don/doff left  sock                  Lower body assist Assist for lower body dressing:  (Max A. Reacher used.)      Child psychotherapist steps completed by patient: Adjust clothing prior to toileting Toileting steps completed by helper: Performs perineal hygiene    Toileting assist Assist level: Two  helpers   Transfers Chair/bed transfer   Chair/bed transfer method: Stand pivot Chair/bed transfer assist level: Moderate assist (Pt 50 - 74%/lift or lower) Chair/bed transfer assistive device: Walker, Armrests, Bedrails     Locomotion Ambulation     Max distance: 10 Assist level: Moderate assist (Pt 50 - 74%)   Wheelchair   Type: Manual   Assist Level: Touching or steadying assistance (Pt > 75%)  Cognition Comprehension Comprehension assist level: Understands complex 90% of the time/cues 10% of the time  Expression Expression assist level: Expresses complex 90% of the time/cues < 10% of the time  Social Interaction Social Interaction assist level: Interacts appropriately with others with medication or extra time (anti-anxiety, antidepressant).  Problem Solving Problem solving assist level: Solves basic 90% of the time/requires cueing < 10% of the time  Memory Memory assist level: More than reasonable amount of time     Medical Problem List and Plan:  1. 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  Cont CIR  Bracing ordered  Fluoxetine started 1/12 2. DVT Prophylaxis/Anticoagulation: Pharmaceutical: Lovenox  3. Back pain/RLE neuropathy/Pain Management:   Neurontin increased to 300 TID on 1/14  Robaxin PRN started 1/12  CT reviewed from 2015 showing L4-5 post-surgical changes, L3-4 stable impingement.   Xrays ordered 1/15 4. Mood: team to provide ego support. LCSW to follow for evaluation and support.  5. Neuropsych: This patient is capable of making decisions on her own behalf.  6. Skin/Wound Care: routine pressure relief measures.  7. Fluids/Electrolytes/Nutrition: Monitor I/O.   Dysphag 3 thin, advance as tolerated.  BMP within acceptable range 1/12  Labs ordered for tomorrow 8. [id number] BP bid  Controlled 1/15 9. Urinary retention: Improving  UA neg, Ucx NG.   Scheduled toileting--monitor voiding with PVR neg.  10. Chronic  constipation: Uses suppository every other day. Added senna S at bedtime with suppository daily as needed.  11. Hypokalemia : Likely dilutional-- supplemented 1/11.   WNL on 1/12  Labs ordered for tomorrow 12. Dyslipidemia: On zocor  13. Tobacco abuse: Nicotine patch. Encourage cessation.  14. H/o Gout: Last flare past fall. Monitor for now.   LOS (Days) 4 A FACE TO FACE EVALUATION WAS PERFORMED  Ankit Lorie Phenix 12/21/2016 8:25 AM

## 2016-12-22 ENCOUNTER — Inpatient Hospital Stay (HOSPITAL_COMMUNITY): Payer: Medicare Other | Admitting: Speech Pathology

## 2016-12-22 ENCOUNTER — Inpatient Hospital Stay (HOSPITAL_COMMUNITY): Payer: Medicare Other | Admitting: Physical Therapy

## 2016-12-22 ENCOUNTER — Inpatient Hospital Stay (HOSPITAL_COMMUNITY): Payer: Self-pay | Admitting: Occupational Therapy

## 2016-12-22 ENCOUNTER — Inpatient Hospital Stay (HOSPITAL_COMMUNITY): Payer: Medicare Other | Admitting: Occupational Therapy

## 2016-12-22 DIAGNOSIS — M961 Postlaminectomy syndrome, not elsewhere classified: Secondary | ICD-10-CM

## 2016-12-22 LAB — BASIC METABOLIC PANEL
Anion gap: 13 (ref 5–15)
BUN: 18 mg/dL (ref 6–20)
CO2: 22 mmol/L (ref 22–32)
Calcium: 9.7 mg/dL (ref 8.9–10.3)
Chloride: 103 mmol/L (ref 101–111)
Creatinine, Ser: 0.76 mg/dL (ref 0.44–1.00)
GFR calc Af Amer: >60 mL/min (ref >60)
GFR calc non Af Amer: >60 mL/min (ref >60)
Glucose, Bld: 94 mg/dL (ref 65–99)
Potassium: 4 mmol/L (ref 3.5–5.1)
Sodium: 138 mmol/L (ref 135–145)

## 2016-12-22 LAB — CBC WITH DIFFERENTIAL/PLATELET
Basophils Absolute: 0.1 10*3/uL (ref 0.0–0.1)
Basophils Relative: 1 %
Eosinophils Absolute: 0.5 10*3/uL (ref 0.0–0.7)
Eosinophils Relative: 6 %
HCT: 44.2 % (ref 36.0–46.0)
Hemoglobin: 14.8 g/dL (ref 12.0–15.0)
Lymphocytes Relative: 51 %
Lymphs Abs: 4.3 10*3/uL — ABNORMAL HIGH (ref 0.7–4.0)
MCH: 28.2 pg (ref 26.0–34.0)
MCHC: 33.5 g/dL (ref 30.0–36.0)
MCV: 84.2 fL (ref 78.0–100.0)
Monocytes Absolute: 0.6 10*3/uL (ref 0.1–1.0)
Monocytes Relative: 7 %
Neutro Abs: 3 10*3/uL (ref 1.7–7.7)
Neutrophils Relative %: 35 %
Platelets: 202 10*3/uL (ref 150–400)
RBC: 5.25 MIL/uL — ABNORMAL HIGH (ref 3.87–5.11)
RDW: 15.1 % (ref 11.5–15.5)
WBC: 8.5 10*3/uL (ref 4.0–10.5)

## 2016-12-22 MED ORDER — GABAPENTIN 400 MG PO CAPS
500.0000 mg | ORAL_CAPSULE | Freq: Three times a day (TID) | ORAL | Status: DC
  Administered 2016-12-23: 500 mg via ORAL
  Filled 2016-12-22: qty 1

## 2016-12-22 MED ORDER — LIDOCAINE 5 % EX PTCH
2.0000 | MEDICATED_PATCH | CUTANEOUS | Status: DC
  Administered 2016-12-23 – 2016-12-31 (×9): 2 via TRANSDERMAL
  Filled 2016-12-22 (×9): qty 2

## 2016-12-22 MED ORDER — GABAPENTIN 100 MG PO CAPS
200.0000 mg | ORAL_CAPSULE | Freq: Once | ORAL | Status: AC
  Administered 2016-12-22: 200 mg via ORAL
  Filled 2016-12-22: qty 2

## 2016-12-22 MED ORDER — LIDOCAINE 5 % EX PTCH
1.0000 | MEDICATED_PATCH | CUTANEOUS | Status: DC
  Administered 2016-12-22: 1 via TRANSDERMAL
  Filled 2016-12-22: qty 1

## 2016-12-22 NOTE — Progress Notes (Signed)
Occupational Therapy Session Note  Patient Details  Name: Ellen Henry MRN: [id number] Date of Birth: June 10, 1946  Today's Date: 12/22/2016  Session 1 OT Individual Time: 1005-1105 OT Individual Time Calculation (min): 60 min   Session 2 OT Individual Time: 1300-1330 OT Individual Time Calculation (min): 30 min    Short Term Goals: WeekS 1:  OT Short Term Goal 1 (Week 1): Pt will maintain static standing balance for 1 minutes with min A at RW/ sink in prep for functional task OT Short Term Goal 2 (Week 1): Pt will recall hemi-dressing techniques 2 consecutive days with min questioning cues OT Short Term Goal 3 (Week 1): Pt will complete sit<>stand with overall Min A un preparation for LB dressing tasks  Skilled Therapeutic Interventions/Progress Updates:  Session 1  OT treatment session focused on modified bathing/dressing, transfer training, and forced use of L Ue. Stand-pivot transfers (shower, then toilet) with Mod A, grab bars, and verbal cues for technique. Incorporated forced use of L UE during b/d tasks, Pt able to grasp wash cloth today, and assist with washing R UE with Min elbow support to reach across body.Min A sit<>stand, Mod A for controlled sit and VC for safety. Instructed on modified strategy for donning bra, pt still required hand-over-hand assist to hook bra in front and twist around body. Utilized reacher to thread L LE into underwear with min A and max A to pull pants over hips. Worked on integrating L UE into daily tasks including applying deodorant and opening containers. Pt left seated in wc at end of session with needs met.  Session 2 OT treatment session with focus on L fine-motor coordination and L NMR. Pt brought to therapy gym and worked on graded thera putty activities with focuse on grip, in-hand manipipulation, and pincer grasp. Rec Therapist present for session to evaluate meaningful activities. Pt returned to room at end of session and left with needs  met.  Therapy Documentation Precautions:  Precautions Precautions: Fall Precaution Comments: Inattention to L arm and L sided weakness Restrictions Weight Bearing Restrictions: No Pain:   ADL: ADL ADL Comments: please see functional navigator  See Function Navigator for Current Functional Status.   Therapy/Group: Individual Therapy  [name] 12/22/2016, 3:43 PM

## 2016-12-22 NOTE — Progress Notes (Signed)
[facility]     PROGRESS NOTE  Subjective/Complaints:  Pt seen laying in bed this AM.  She states her back still hurts, but slept well overnight. Per nursing, pt with pain overnight.   ROS:  +Back pain. Denies CP, SOB, N/V/D.  Objective: Vital Signs: Blood pressure 124/78, pulse 88, temperature 97.7 F (36.5 C), temperature source Oral, resp. rate 19, height 5' (1.524 m), weight 103.2 kg (227 lb 9.6 oz), SpO2 98 %. Dg Lumbar Spine Complete  Result Date: 12/21/2016 CLINICAL DATA:  Chronic low back pain radiating to LEFT leg. EXAM: LUMBAR SPINE - COMPLETE 4+ VIEW COMPARISON:  CT lumbar spine September 28, 2014 FINDINGS: Lumbar vertebral bodies intact. Status post L4-5 PLIF and LEFT lateral fusion with intact well-seated hardware, incorporated interbody fusion material. Non surgically altered disc heights preserved with mild endplate spurring. No destructive bony lesions. Sacroiliac joints are symmetric. At least moderate degenerative changes included thoracic spine. Prevertebral and soft tissue planes are nonsuspicious. RIGHT gluteal injection granuloma. IMPRESSION: Status post L4-5 fusion with arthrodesis, no radiographic findings of hardware failure. Mild degenerative change of lumbar spine without acute fracture deformity or malalignment. Electronically Signed   By: Elon Alas M.D.   On: 12/21/2016 13:43    Recent Labs  12/22/16 0643  WBC 8.5  HGB 14.8  HCT 44.2  PLT 202    Recent Labs  12/22/16 0643  NA 138  K 4.0  CL 103  GLUCOSE 94  BUN 18  CREATININE 0.76  CALCIUM 9.7   CBG (last 3)  No results for input(s): GLUCAP in the last 72 hours.  Wt Readings from Last 3 Encounters:  12/17/16 103.2 kg (227 lb 9.6 oz)  12/15/16 104.3 kg (230 lb)  03/10/15 108.9 kg (240 lb)    Physical Exam:  BP 124/78 (BP Location: Right Arm)   Pulse 88   Temp 97.7 F (36.5 C) (Oral)   Resp 19   Ht 5' (1.524 m)   Wt 103.2 kg (227 lb 9.6 oz)   SpO2  98%   BMI 44.45 kg/m  Constitutional: She appears well-developed and well-nourished.  HENT: Normocephalic and atraumatic.  Eyes: EOMI. No discharge.  Cardiovascular: RRR. No JVD. Respiratory: Effort normal and breath sounds normal.  GI: Soft. Bowel sounds are normal.  Musculoskeletal: She exhibits no edema or tenderness.  Neurological: She is alert and oriented.  Left facial droop with mild dysarthria.  Reasonable insight and awareness into deficits.  Motor: LUE: deltoid 3-/5 proximal to distal (stable) LLE: 4-/5 HF, 3/5 KE, ADF/DP 4/5  Skin: Skin is warm and dry. Intact.   Assessment/Plan: 1. Functional deficits secondary to right post limb internal capsule infarct which require 3+ hours per day of interdisciplinary therapy in a comprehensive inpatient rehab setting. Physiatrist is providing close team supervision and 24 hour management of active medical problems listed below. Physiatrist and rehab team continue to assess barriers to discharge/monitor patient progress toward functional and medical goals.  Function:  Bathing Bathing position Bathing activity did not occur:  (pt had shower on day shift) Position: Shower  Bathing parts Body parts bathed by patient: Chest, Abdomen, Right upper leg, Left upper leg, Right lower leg, Left lower leg, Left arm, Front perineal area Body parts bathed by helper: Buttocks, Right arm  Bathing assist Assist Level: Assistive device, Touching or steadying assistance(Pt > 75%) Assistive Device Comment: long-handled sponge    Upper Body Dressing/Undressing Upper body dressing   What is the patient wearing?: Bra,  Pull over shirt/dress Bra - Perfomed by patient: Thread/unthread right bra strap, Thread/unthread left bra strap Bra - Perfomed by helper: Hook/unhook bra (pull down sports bra) Pull over shirt/dress - Perfomed by patient: Thread/unthread right sleeve, Put head through opening Pull over shirt/dress - Perfomed by helper: Pull shirt over  trunk, Thread/unthread left sleeve        Upper body assist Assist Level: Touching or steadying assistance(Pt > 75%)      Lower Body Dressing/Undressing Lower body dressing   What is the patient wearing?: Underwear, Pants, Non-skid slipper socks Underwear - Performed by patient: Thread/unthread right underwear leg Underwear - Performed by helper: Thread/unthread left underwear leg, Pull underwear up/down Pants- Performed by patient: Thread/unthread right pants leg Pants- Performed by helper: Thread/unthread left pants leg, Pull pants up/down   Non-skid slipper socks- Performed by helper: Don/doff left sock, Don/doff right sock                  Lower body assist Assist for lower body dressing:  (Max A)      Toileting Toileting   Toileting steps completed by patient: Adjust clothing prior to toileting Toileting steps completed by helper: Adjust clothing after toileting Toileting Assistive Devices: Grab bar or rail  Toileting assist Assist level: Touching or steadying assistance (Pt.75%)   Transfers Chair/bed transfer   Chair/bed transfer method: Stand pivot Chair/bed transfer assist level: Maximal assist (Pt 25 - 49%/lift and lower) Chair/bed transfer assistive device: Armrests, Medical sales representative     Max distance: 20 Assist level: Touching or steadying assistance (Pt > 75%)   Wheelchair   Type: Manual   Assist Level: Touching or steadying assistance (Pt > 75%)  Cognition Comprehension Comprehension assist level: Understands complex 90% of the time/cues 10% of the time  Expression Expression assist level: Expresses complex 90% of the time/cues < 10% of the time  Social Interaction Social Interaction assist level: Interacts appropriately with others with medication or extra time (anti-anxiety, antidepressant).  Problem Solving Problem solving assist level: Solves basic 90% of the time/requires cueing < 10% of the time  Memory Memory assist level:  More than reasonable amount of time     Medical Problem List and Plan:  1. 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  Cont CIR  Bracing ordered  Fluoxetine started 1/12 2. DVT Prophylaxis/Anticoagulation: Pharmaceutical: Lovenox  3. Back pain/RLE neuropathy/Pain Management:   Neurontin increased to 300 TID on 1/14  Robaxin PRN started 1/12  CT reviewed from 2015 showing L4-5 post-surgical changes, L3-4 stable impingement.   L-spine Xrays 1/15 reviewed, showing stable post-surgical changes L4-5 4. Mood: team to provide ego support. LCSW to follow for evaluation and support.  5. Neuropsych: This patient is capable of making decisions on her own behalf.  6. Skin/Wound Care: routine pressure relief measures.  7. Fluids/Electrolytes/Nutrition: Monitor I/O.   Dysphag 3 thin, advance as tolerated.  BMP WNL 1/16 8. [id number] BP bid  Controlled 1/16 9. Urinary retention: Improving  UA neg, Ucx NG.   Scheduled toileting--monitor voiding with PVR neg.  10. Chronic constipation: Uses suppository every other day. Added senna S at bedtime with suppository daily as needed.  11. Hypokalemia : Likely dilutional-- supplemented 1/11.   WNL on 1/16 12. Dyslipidemia: On zocor  13. Tobacco abuse: Nicotine patch. Encourage cessation.  14. H/o Gout: Last flare past fall. Monitor for now.   LOS (Days) 5 A FACE TO FACE EVALUATION WAS PERFORMED  Ankit Lorie Phenix 12/22/2016 8:45 AM

## 2016-12-22 NOTE — Progress Notes (Signed)
Physical Therapy Session Note  Patient Details  Name: ASHTEN SCHARRER MRN: [id number] Date of Birth: [birth year]  Today's Date: 12/22/2016 PT Individual Time: 0900-1000 PT Individual Time Calculation (min): 60 min    Short Term Goals: Week 1:  PT Short Term Goal 1 (Week 1): Pt will perform bed mobility and supine to sit with modified independence. PT Short Term Goal 2 (Week 1): Pt will ambulate 25 ft wtih least restrictive device and min assist. PT Short Term Goal 3 (Week 1): Pt will perform mat to chair and sit to stand with min assist. PT Short Term Goal 4 (Week 1): Pt will stand while performing upper extremity task with min assist for balance.  Skilled Therapeutic Interventions/Progress Updates:     Pt was in bed at start of therapy. Pt transferred to EOB on L with bed flat and using handrails with CGA. Pt required max assist from therapist to pull pants leg over L foot. Pt performed sit-to stand transfer with CGA to pull on pants. Pt required min assist from therapist to pull up waist line up on L side. Stand pivot tansfer with RW into W/C required CGA and verbal cues for sequencing and foot placement.   Pt self-propelled in W/C with R hemi technique supervision 150 ft before taking a short break to roll the last 50 ft to the rehab gym.   Pt ambulated 40 ft min assist for weight shift with RW with handsplint attached and with use of mirror for visual feedback to maintain body at midline and continue to increase pt's awareness of affected side. Pt required CGA and verbal cues for undoing handsplint strap, foot and hand placement, and anterior weight shift with sit-to-stand transfer in and out of W/C.   Pt ambulated up and down 8 three inch with a step to pattern, use of bilateral hand rails, and min A from therapist for weight shift to clear LLE. Pt required mod verbal cues for sequence and hand placement for activity. CGA was required to stand up and sit back into W/C during activity.    Pt was rolled back into room where family was waiting. Pt was left in W/C with call bell and all needs within reach.         Therapy Documentation Precautions:  Precautions Precautions: Fall Precaution Comments: Inattention to L arm and L sided weakness Restrictions Weight Bearing Restrictions: No General:    See Function Navigator for Current Functional Status.   Therapy/Group: Individual Therapy  Rosendo Gros 12/22/2016, 12:07 PM

## 2016-12-22 NOTE — Progress Notes (Signed)
Speech Language Pathology Daily Session Note  Patient Details  Name: Ellen Henry MRN: [id number] Date of Birth: May 26, 1946  Today's Date: 12/22/2016 SLP Individual Time: 1330-1415 SLP Individual Time Calculation (min): 45 min   Short Term Goals: Week 1: SLP Short Term Goal 1 (Week 1): Pt will consume dys 3 textures and thin liquids with mod I use of swallowing precautions over 2 targeted sessions.   SLP Short Term Goal 2 (Week 1): Pt will consume therapeutic trials of regular textures with mod I use of swallowing precautions over 2 targeted sessions prior to advancement.  SLP Short Term Goal 3 (Week 1): Pt will solve mildly complex problems Mod I for errors correction    Skilled Therapeutic Interventions:   Skilled treatment session focused on addressing dysphagia and cognition goals. Patient consumed Dys.3, regular textures and thin liquids with effective mastication and oral clearance and no overt s/s of aspiration.  Patient also reported that she has been eating crackers on her own.  Recommend diet upgrade to regular textures and thin liquids with intermittent supervision.  SLP facilitated session by providing set-up of a new mildly complex new learning task and Min faded to Supervision level verbal cues for recall and accurate carryout of procedures.  Continue with current plan of care.    Function:  Eating Eating   Modified Consistency Diet: No Eating Assist Level: More than reasonable amount of time;Set up assist for   Eating Set Up Assist For: Opening containers       Cognition Comprehension Comprehension assist level: Understands complex 90% of the time/cues 10% of the time  Expression   Expression assist level: Expresses complex 90% of the time/cues < 10% of the time  Social Interaction Social Interaction assist level: Interacts appropriately with others with medication or extra time (anti-anxiety, antidepressant).  Problem Solving Problem solving assist level: Solves  basic 90% of the time/requires cueing < 10% of the time  Memory Memory assist level: More than reasonable amount of time    Pain Pain Assessment Pain Assessment: No/denies pain  Therapy/Group: Individual Therapy  Ellen Henry., CCC-SLP [id number]  [place] 12/22/2016, 4:18 PM

## 2016-12-23 ENCOUNTER — Inpatient Hospital Stay (HOSPITAL_COMMUNITY): Payer: Self-pay | Admitting: Occupational Therapy

## 2016-12-23 ENCOUNTER — Inpatient Hospital Stay (HOSPITAL_COMMUNITY): Payer: Medicare Other | Admitting: Physical Therapy

## 2016-12-23 ENCOUNTER — Inpatient Hospital Stay (HOSPITAL_COMMUNITY): Payer: Medicare Other | Admitting: Speech Pathology

## 2016-12-23 ENCOUNTER — Inpatient Hospital Stay (HOSPITAL_COMMUNITY): Payer: Self-pay | Admitting: Physical Therapy

## 2016-12-23 DIAGNOSIS — M10071 Idiopathic gout, right ankle and foot: Secondary | ICD-10-CM

## 2016-12-23 DIAGNOSIS — M25561 Pain in right knee: Secondary | ICD-10-CM

## 2016-12-23 MED ORDER — GABAPENTIN 400 MG PO CAPS
400.0000 mg | ORAL_CAPSULE | Freq: Three times a day (TID) | ORAL | Status: DC
  Administered 2016-12-23 – 2016-12-31 (×24): 400 mg via ORAL
  Filled 2016-12-23 (×24): qty 1

## 2016-12-23 MED ORDER — COLCHICINE 0.6 MG PO TABS
0.6000 mg | ORAL_TABLET | Freq: Every day | ORAL | Status: DC
  Administered 2016-12-23 – 2016-12-31 (×9): 0.6 mg via ORAL
  Filled 2016-12-23 (×9): qty 1

## 2016-12-23 MED ORDER — DICLOFENAC SODIUM 1 % TD GEL
2.0000 g | Freq: Four times a day (QID) | TRANSDERMAL | Status: DC
  Administered 2016-12-23 – 2016-12-30 (×22): 2 g via TOPICAL
  Filled 2016-12-23 (×2): qty 100

## 2016-12-23 MED ORDER — COLCHICINE 0.6 MG PO TABS
1.2000 mg | ORAL_TABLET | Freq: Once | ORAL | Status: AC
  Administered 2016-12-23: 1.2 mg via ORAL
  Filled 2016-12-23: qty 2

## 2016-12-23 NOTE — Progress Notes (Signed)
vOccupational Therapy Session Note  Patient Details  Name: Ellen Henry MRN: [id number] Date of Birth: 01/01/1946  Today's Date: 12/23/2016 OT Individual Time: 1000-1100 OT Individual Time Calculation (min): 60 min     Short Term Goals: Week 1:  OT Short Term Goal 1 (Week 1): Pt will maintain static standing balance for 1 minutes with min A at RW/ sink in prep for functional task OT Short Term Goal 2 (Week 1): Pt will recall hemi-dressing techniques 2 consecutive days with min questioning cues OT Short Term Goal 3 (Week 1): Pt will complete sit<>stand with overall Min A un preparation for LB dressing tasks  Skilled Therapeutic Interventions/Progress Updates:    1:1  OT sessionon treatment addressing modified bathing/dressing . Stand-pivot toilet transfer with Min A, + bladder, set-up A toileting. Modified bathing/dressing at the sink with focus functional use of L UE and hemi-techniques. Sit<>stands with overall Min A. L UE NMR and fine-motor coordination with focus on normal movement patterns, and grasp/release. Utilized [pillow for elbow support for improved distal control. Pt left seaetd in wx with needs met.   Therapy Documentation Precautions:  Precautions Precautions: Fall Precaution Comments: Inattention to L arm and L sided weakness Restrictions Weight Bearing Restrictions: No Pain: Pain Assessment Pain Assessment: 0-10 Pain Score: 4  Pain Type: Chronic pain Pain Location: Back Patients Stated Pain Goal: 3 Pain Intervention(s): Repositioned ADL: ADL ADL Comments: please see functional navigator  See Function Navigator for Current Functional Status.   Therapy/Group: Individual Therapy  [name] 12/23/2016, 9:42 AM

## 2016-12-23 NOTE — Patient Care Conference (Signed)
Inpatient RehabilitationTeam Conference and Plan of Care Update Date: 12/23/2016   Time: 2:10 PM    Patient Name: Ellen Henry      Medical Record Number: [id number]  Date of Birth: 1946/08/08 Sex: Female         Room/Bed: 4W05C/4W05C-01 Payor Info: Payor: [employer] MEDICARE / Plan: [employer] MEDICARE / Product Type: *No Product type* /    Admitting Diagnosis: R CVA  Admit Date/Time:  12/17/2016  4:51 PM Admission Comments: No comment available   Primary Diagnosis:  <principal problem not specified> Principal Problem: <principal problem not specified>  Patient Active Problem List   Diagnosis Date Noted  . Acute idiopathic gout involving toe of right foot   . Acute pain of right knee   . Failed back syndrome   . Pain   . Chronic pain syndrome   . Labile blood pressure   . Radicular pain   . Benign essential HTN   . Slow transit constipation   . Chronic bilateral low back pain without sciatica   . Dysphagia, post-stroke   . Stroke (cerebrum) ([place]) 12/17/2016  . Left hemiparesis ([place])   . Generalized weakness   . Acute ischemic stroke ([place]) 12/15/2016  . Urinary retention 02/25/2015  . Primary osteoarthritis of left knee 02/15/2015  . Morbid obesity ([place]) 02/15/2015  . Essential hypertension, benign 09/07/2014  . Constipation 09/07/2014  . Hyperlipidemia 09/05/2014  . Gout 09/05/2014  . Acute blood loss anemia 09/05/2014  . Neuropathy ([place]) 09/05/2014  . UTI (urinary tract infection) 08/31/2014  . Radiculopathy 08/29/2014    Expected Discharge Date: Expected Discharge Date: 01/07/17  Team Members Present: Physician leading conference: Dr. 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 on  12/15/15  Improve pain, urinary symptoms, safety, mobility, gout  See above   Bowel/Bladder   continent of bowel and bladder can be incontinent of bladder at times LBM 12-22-16 required suppository  Continent of bowel and bladder  Assist patient with tolieting, ensure regular bowel management and treat as needed    Swallow/Nutrition/ Hydration   regular textures and thin liquids with intermittent supervision upgraded 1/16-1/17  least restrictive PO intake Mod I   diet toelration and education    ADL's   Overall Mod A  Mod I/supervision overall  L NMR, L body awareness, improved sit<>stands, functional transfers, modified bathing/dressing, activity tolerance, pt.family education    Mobility   min A overall short distance ambulation  supervision except min A stairs and car transfer  LUE/LLE NMR, L attn to body and environment, functional mobility training, standing balance, activity tolerance, pt education   Communication             Safety/Cognition/ Behavioral Observations  Complex Supervision-Min A  Complex Mod I   self-monitoring and correcting    Pain   pain uncontrolled current regimen2 lidocaine patches, schedule gabention 500mg , prn robaxin q6, tylenol q 4   pain control at or below 5  Evaluate current pain management plan   Skin   no skin issues   no new skin injury/breakdown  Assess skin q shift and prn      *See Care Plan and progress notes for long and short-term goals.  Barriers to Discharge: Pain,  urinary symptoms, safety, mobility, acute gout flare    Possible Resolutions to Barriers:  Gout meds started, optimize pain meds, bladder improving, therapies    Discharge Planning/Teaching Needs:  Home with daughter and her family between her children they can provide 24 hr care      Team Discussion:  Making progress toward her goals of supervision level.  L-inattention is getting better. Pain is controlled Gout flare up today on meds for. Currently min-mod level of assist.  Family to come in and learn her care closer to discharge.   Revisions to Treatment Plan:  None   Continued Need for Acute Rehabilitation Level of Care: The patient requires daily medical management by a physician with specialized training in physical medicine and rehabilitation for the following conditions: Daily direction of a multidisciplinary physical rehabilitation program to ensure safe treatment while eliciting the highest outcome that is of practical value to the patient.: Yes Daily medical management of patient stability for increased activity during participation in an intensive rehabilitation regime.: Yes Daily analysis of laboratory values and/or radiology reports with any subsequent need for medication adjustment of medical intervention for : Neurological problems;Urological problems  [name] 12/23/2016, 2:51 PM

## 2016-12-23 NOTE — Progress Notes (Signed)
Social Work Patient ID: Ellen Henry, female   DOB: [birth year], 71 y.o.   MRN: [id number]  Met with pt to discuss team confernecegoals supervision level and discharge target 2/1. She reports her gout is acting up today, that's all she needed. She is pleased with her progress and is hopeful with pushing herself she will do well here. She will have her family Come in closer to discharge to learn her care.

## 2016-12-23 NOTE — Progress Notes (Addendum)
Physical Therapy Session Note  Patient Details  Name: Ellen Henry MRN: [id number] Date of Birth: Aug 11, 1946  Today's Date: 12/23/2016 PT Concurrent Time: 0900-1000 PT Concurrent Time Calculation (min): 60 min   Skilled Therapeutic Interventions/Progress Updates:  Pt participated in skilled Concurrent PT treatment.  Pt received in w/c & agreeable to tx, denying c/o pain. Therapist provided max assist for donning pants for time management purposes. Session focused on gait training, LUE/LE neuro re-ed, strengthening, transfers, and pt education. Gait training x 30 ft + 80 ft with RW, L hand orthosis, and min assist. Pt requires cuing for step length BLE and manual facilitation for weight shifting. Pt with more impaired gait pattern as she fatigues. Pt utilized cybex kinetron in sitting for BLE strengthening & neuro re-ed. Forced use of LUE & neuro re-ed task with pt retrieving small blue foam cubes & placing them in a cup. Pt with impaired fine motor use of LUE. Pt completed multiple sit<>stand transfers during session with max cuing for technique (anterior weight shifting, hand placement, & to square up to w/c before sitting), as well as min/mod assist for balance. Educated pt on CVA recovery, risk factors of having another CVA, and lifestyle modifications to reduce risk (no smoking or drinking, heart healthy diet). At end of session pt left sitting in w/c in room with all needs within reach.      Therapy Documentation Precautions: Precautions Precautions: Fall Precaution Comments: Inattention to L arm and L sided weakness Restrictions Weight Bearing Restrictions: No    See Function Navigator for Current Functional Status.   Therapy/Group: Individual Therapy  Waunita Schooner 12/23/2016, 5:48 PM

## 2016-12-23 NOTE — Plan of Care (Signed)
Problem: RH PAIN MANAGEMENT Goal: RH STG PAIN MANAGED AT OR BELOW PT'S PAIN GOAL 4 or less- chronic pain    Outcome: Not Progressing Patient states pain is not controled on current regimen

## 2016-12-23 NOTE — Progress Notes (Signed)
Speech Language Pathology Daily Session Note  Patient Details  Name: Ellen Henry MRN: [id number] Date of Birth: [birth year]  Today's Date: 12/23/2016 SLP Individual Time: 1502-1530 SLP Individual Time Calculation (min): 28 min   Short Term Goals: Week 1: SLP Short Term Goal 1 (Week 1): Pt will consume dys 3 textures and thin liquids with mod I use of swallowing precautions over 2 targeted sessions.   SLP Short Term Goal 1 - Progress (Week 1): Met SLP Short Term Goal 2 (Week 1): Pt will consume therapeutic trials of regular textures with mod I use of swallowing precautions over 2 targeted sessions prior to advancement.  SLP Short Term Goal 2 - Progress (Week 1): Met SLP Short Term Goal 3 (Week 1): Pt will solve mildly complex problems Mod I for errors correction   SLP Short Term Goal 4 (Week 1): Pt will consume regular textures and thin liquids with Mod I use of safe swallow strateiges over 2 targeted sessions.   Skilled Therapeutic Interventions:   Skilled treatment session focused on addressing dysphagia and cognition goals. SLP facilitated session by providing skilled observation of patient consuming regular textures and thin liquids via straw.  Patient demonstrated efficient masticaion and oral clearance of regular textures with no overt s/s of aspiration.  Recommend to continue with diet upgrade.  Patient also completed complex verbal problem solving tasks with increased wait time to provide 2 solutions to each home management problem proposed.  Continue with current plan of care.    Function:  Eating Eating   Modified Consistency Diet: No Eating Assist Level: More than reasonable amount of time;Set up assist for           Cognition Comprehension Comprehension assist level: Understands complex 90% of the time/cues 10% of the time  Expression   Expression assist level: Expresses complex 90% of the time/cues < 10% of the time  Social Interaction Social Interaction assist  level: Interacts appropriately with others with medication or extra time (anti-anxiety, antidepressant).  Problem Solving Problem solving assist level: Solves basic 90% of the time/requires cueing < 10% of the time  Memory Memory assist level: More than reasonable amount of time    Pain Pain Assessment Pain Assessment: 0-10 Pain Score: 2  Pain Type: Chronic pain Pain Location: Back Pain Orientation: Mid Pain Descriptors / Indicators: Aching Pain Onset: On-going Patients Stated Pain Goal: 2 Pain Intervention(s): Repositioned Multiple Pain Sites: No  Therapy/Group: Individual Therapy  Carmelia Roller., [place] [phone]  [place] 12/23/2016, 3:12 PM

## 2016-12-23 NOTE — Progress Notes (Signed)
[place] PHYSICAL MEDICINE & REHABILITATION     PROGRESS NOTE  Subjective/Complaints:  Pt states she slept overnight.  She does not what she feels is another gout flare in her right great toe.  She also notes her right knee is sore from exercising yesterday.  The Lidoderm patch helps.    ROS:  +right knee sore, right great toe pain. Denies CP, SOB, N/V/D.  Objective: Vital Signs: Blood pressure 116/71, pulse 76, temperature 98.4 F (36.9 C), temperature source Oral, resp. rate 17, height 5' (1.524 m), weight 103.2 kg (227 lb 9.6 oz), SpO2 98 %. Dg Lumbar Spine Complete  Result Date: 12/21/2016 CLINICAL DATA:  Chronic low back pain radiating to LEFT leg. EXAM: LUMBAR SPINE - COMPLETE 4+ VIEW COMPARISON:  CT lumbar spine September 28, 2014 FINDINGS: Lumbar vertebral bodies intact. Status post L4-5 PLIF and LEFT lateral fusion with intact well-seated hardware, incorporated interbody fusion material. Non surgically altered disc heights preserved with mild endplate spurring. No destructive bony lesions. Sacroiliac joints are symmetric. At least moderate degenerative changes included thoracic spine. Prevertebral and soft tissue planes are nonsuspicious. RIGHT gluteal injection granuloma. IMPRESSION: Status post L4-5 fusion with arthrodesis, no radiographic findings of hardware failure. Mild degenerative change of lumbar spine without acute fracture deformity or malalignment. Electronically Signed   By: Elon Alas M.D.   On: 12/21/2016 13:43    Recent Labs  12/22/16 0643  WBC 8.5  HGB 14.8  HCT 44.2  PLT 202    Recent Labs  12/22/16 0643  NA 138  K 4.0  CL 103  GLUCOSE 94  BUN 18  CREATININE 0.76  CALCIUM 9.7   CBG (last 3)  No results for input(s): GLUCAP in the last 72 hours.  Wt Readings from Last 3 Encounters:  12/17/16 103.2 kg (227 lb 9.6 oz)  12/15/16 104.3 kg (230 lb)  03/10/15 108.9 kg (240 lb)    Physical Exam:  BP 116/71 (BP Location: Right Arm)   Pulse 76    Temp 98.4 F (36.9 C) (Oral)   Resp 17   Ht 5' (1.524 m)   Wt 103.2 kg (227 lb 9.6 oz)   SpO2 98%   BMI 44.45 kg/m  Constitutional: She appears well-developed and well-nourished.  HENT: Normocephalic and atraumatic.  Eyes: EOMI. No discharge.  Cardiovascular: RRR. No JVD. Respiratory: Effort normal and breath sounds normal.  GI: Soft. Bowel sounds are normal.  Musculoskeletal: She exhibits no edema or tenderness.  Neurological: She is alert and oriented.  Left facial droop with mild dysarthria.  Reasonable insight and awareness into deficits.  Motor: LUE: deltoid 3-/5 proximal to distal (stable) LLE: 4-/5 HF, 4-/5 KE, ADF/DP 4/5  Skin: Skin is warm and dry. Intact.   Assessment/Plan: 1. Functional deficits secondary to right post limb internal capsule infarct which require 3+ hours per day of interdisciplinary therapy in a comprehensive inpatient rehab setting. Physiatrist is providing close team supervision and 24 hour management of active medical problems listed below. Physiatrist and rehab team continue to assess barriers to discharge/monitor patient progress toward functional and medical goals.  Function:  Bathing Bathing position Bathing activity did not occur:  (pt had shower on day shift) Position: Shower  Bathing parts Body parts bathed by patient: Chest, Abdomen, Right upper leg, Left upper leg, Right lower leg, Left lower leg, Left arm, Front perineal area, Buttocks Body parts bathed by helper: Right arm  Bathing assist Assist Level: Assistive device, Touching or steadying assistance(Pt > 75%) Assistive Device  Comment: long-handled sponge    Upper Body Dressing/Undressing Upper body dressing   What is the patient wearing?: Bra, Pull over shirt/dress Bra - Perfomed by patient: Thread/unthread right bra strap, Thread/unthread left bra strap Bra - Perfomed by helper: Hook/unhook bra (pull down sports bra) Pull over shirt/dress - Perfomed by patient: Thread/unthread  right sleeve, Thread/unthread left sleeve, Put head through opening Pull over shirt/dress - Perfomed by helper: Pull shirt over trunk        Upper body assist Assist Level: Touching or steadying assistance(Pt > 75%)      Lower Body Dressing/Undressing Lower body dressing   What is the patient wearing?: Underwear, Pants, Non-skid slipper socks Underwear - Performed by patient: Thread/unthread right underwear leg Underwear - Performed by helper: Thread/unthread left underwear leg, Pull underwear up/down Pants- Performed by patient: Thread/unthread right pants leg Pants- Performed by helper: Thread/unthread left pants leg, Pull pants up/down   Non-skid slipper socks- Performed by helper: Don/doff right sock, Don/doff left sock                  Lower body assist Assist for lower body dressing:  (Max A)      Toileting Toileting   Toileting steps completed by patient: Performs perineal hygiene Toileting steps completed by helper: Adjust clothing after toileting Toileting Assistive Devices: Grab bar or rail  Toileting assist Assist level: Touching or steadying assistance (Pt.75%)   Transfers Chair/bed transfer   Chair/bed transfer method: Stand pivot Chair/bed transfer assist level: Touching or steadying assistance (Pt > 75%) Chair/bed transfer assistive device: Armrests, Medical sales representative     Max distance: 40 ft Assist level: Touching or steadying assistance (Pt > 75%)   Wheelchair   Type: Manual Max wheelchair distance: 150 ft Assist Level: Supervision or verbal cues  Cognition Comprehension Comprehension assist level: Understands complex 90% of the time/cues 10% of the time  Expression Expression assist level: Expresses complex 90% of the time/cues < 10% of the time  Social Interaction Social Interaction assist level: Interacts appropriately with others with medication or extra time (anti-anxiety, antidepressant).  Problem Solving Problem solving  assist level: Solves basic 90% of the time/requires cueing < 10% of the time  Memory Memory assist level: More than reasonable amount of time     Medical Problem List and Plan:  1. 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  Cont CIR  Bracing ordered  Fluoxetine started 1/12 2. DVT Prophylaxis/Anticoagulation: Pharmaceutical: Lovenox  3. Back pain/RLE neuropathy/Pain Management:   Neurontin increased to 300 TID on 1/14  Robaxin PRN started 1/12  CT reviewed from 2015 showing L4-5 post-surgical changes, L3-4 stable impingement.   L-spine Xrays 1/15 reviewed, showing stable post-surgical changes L4-5  Lidoderm patches x2 started 1/16 4. Mood: team to provide ego support. LCSW to follow for evaluation and support.  5. Neuropsych: This patient is capable of making decisions on her own behalf.  6. Skin/Wound Care: routine pressure relief measures.  7. Fluids/Electrolytes/Nutrition: Monitor I/O.   Dysphag 3 thin, advance as tolerated.  BMP WNL 1/16 8. [id number] BP bid  Controlled 1/17 9. Urinary retention: Improving  UA neg, Ucx NG.   Scheduled toileting--monitor voiding with PVR neg.  10. Chronic constipation: Uses suppository every other day. Added senna S at bedtime with suppository daily as needed.  11. Hypokalemia : Likely dilutional-- supplemented 1/11.   WNL on 1/16 12. Dyslipidemia: On zocor  13. Tobacco abuse: Nicotine patch. Encourage  cessation.  14. H/o Gout: With flare  Colchicine started 1/17 15. Right knee pain  Voltaren gel ordered 1/17  Cont Robaxin PRN     LOS (Days) 6 A FACE TO FACE EVALUATION WAS PERFORMED  Ankit Lorie Phenix 12/23/2016 7:54 AM

## 2016-12-23 NOTE — Progress Notes (Signed)
Physical Therapy Session Note  Patient Details  Name: Ellen Henry MRN: [id number] Date of Birth: 1946-04-15  Today's Date: 12/23/2016 PT Individual Time: 1305-1350 PT Individual Time Calculation (min): 45 min    Short Term Goals:Week 1:  PT Short Term Goal 1 (Week 1): Pt will perform bed mobility and supine to sit with modified independence. PT Short Term Goal 2 (Week 1): Pt will ambulate 25 ft wtih least restrictive device and min assist. PT Short Term Goal 3 (Week 1): Pt will perform mat to chair and sit to stand with min assist. PT Short Term Goal 4 (Week 1): Pt will stand while performing upper extremity task with min assist for balance.  Skilled Therapeutic Interventions/Progress Updates:      Pt received sitting in WC and agreeable to PT  WC mobility through hall x 175ft with mod-max assist to maintain straight trajectory    Stairs x 8 on 3 inch with min assist and min cues for step to gait pattern. Noted ataxia in the LLE due ascent and descent.   Gait with RW and L hand splint x 27ft with min assist from PT and min-mod cues for improved hip/knee activation to allow improved foot clearance and normalized gait pattern.   Sit<>stand x5 with 1 UE support on armrests of WC. Min assist from PT throughout for safety and improved anterior weight shift.   Standing HS curls 2x5, standing hip abduction 2x 5 BUE support on RW throughout with min-mod cues/assist for pelvic stability and prevention of LOB to the L.   Patient returned too room and left sitting on toilet following stand pivot transfer with min assist and heavy use of rail. Patient able to manage clothing with only supervision assist. NT aware of patient position.     Therapy Documentation Precautions:  Precautions Precautions: Fall Precaution Comments: Inattention to L arm and L sided weakness Restrictions Weight Bearing Restrictions: No Pain: 0/10   See Function Navigator for Current Functional  Status.   Therapy/Group: Individual Therapy  Lorie Phenix 12/23/2016, 1:28 PM

## 2016-12-24 ENCOUNTER — Ambulatory Visit (HOSPITAL_COMMUNITY): Payer: Self-pay | Admitting: Physical Therapy

## 2016-12-24 ENCOUNTER — Inpatient Hospital Stay (HOSPITAL_COMMUNITY): Payer: Self-pay | Admitting: Physical Therapy

## 2016-12-24 ENCOUNTER — Inpatient Hospital Stay (HOSPITAL_COMMUNITY): Payer: Medicare Other | Admitting: Speech Pathology

## 2016-12-24 ENCOUNTER — Inpatient Hospital Stay (HOSPITAL_COMMUNITY): Payer: Medicare Other | Admitting: Occupational Therapy

## 2016-12-24 ENCOUNTER — Inpatient Hospital Stay (HOSPITAL_COMMUNITY): Payer: Medicare Other | Admitting: Physical Therapy

## 2016-12-24 LAB — BASIC METABOLIC PANEL
Anion gap: 8 (ref 5–15)
BUN: 15 mg/dL (ref 6–20)
CO2: 24 mmol/L (ref 22–32)
Calcium: 9.5 mg/dL (ref 8.9–10.3)
Chloride: 107 mmol/L (ref 101–111)
Creatinine, Ser: 0.65 mg/dL (ref 0.44–1.00)
GFR calc Af Amer: >60 mL/min (ref >60)
GFR calc non Af Amer: >60 mL/min (ref >60)
Glucose, Bld: 97 mg/dL (ref 65–99)
Potassium: 3.9 mmol/L (ref 3.5–5.1)
Sodium: 139 mmol/L (ref 135–145)

## 2016-12-24 LAB — CBC
HCT: 41 % (ref 36.0–46.0)
Hemoglobin: 13.6 g/dL (ref 12.0–15.0)
MCH: 28 pg (ref 26.0–34.0)
MCHC: 33.2 g/dL (ref 30.0–36.0)
MCV: 84.4 fL (ref 78.0–100.0)
Platelets: 234 10*3/uL (ref 150–400)
RBC: 4.86 MIL/uL (ref 3.87–5.11)
RDW: 14.8 % (ref 11.5–15.5)
WBC: 8.7 10*3/uL (ref 4.0–10.5)

## 2016-12-24 NOTE — Progress Notes (Signed)
Physical Therapy Weekly Progress Note  Patient Details  Name: Ellen Henry MRN: [id number] Date of Birth: 1946/12/02  Beginning of progress report period: December 18, 2016 End of progress report period: December 24, 2016  Today's Date: 12/24/2016  Patient has met 3 of 4 short term goals.  Patient making steady progress this reporting period requiring min A overall including household ambulation distances using RW with L hand splint. Patient limited by L inattention to body and environment, gout and chronic back pain, and body habitus. Patient frequently tearful in therapies due to loss of independence due to very active prior level of function, encouragement and emotional support provided.   Patient continues to demonstrate the following deficits muscle weakness, muscle joint tightness and muscle paralysis, decreased cardiorespiratoy endurance, impaired timing and sequencing, abnormal tone, unbalanced muscle activation and decreased coordination, decreased attention to left and decreased standing balance, decreased postural control, hemiplegia and decreased balance strategies and therefore will continue to benefit from skilled PT intervention to increase functional independence with mobility.  Patient progressing toward long term goals. Continue plan of care.  PT Short Term Goals Week 1:  PT Short Term Goal 1 (Week 1): Pt will perform bed mobility and supine to sit with modified independence. PT Short Term Goal 1 - Progress (Week 1): Progressing toward goal PT Short Term Goal 2 (Week 1): Pt will ambulate 25 ft wtih least restrictive device and min assist. PT Short Term Goal 2 - Progress (Week 1): Met PT Short Term Goal 3 (Week 1): Pt will perform mat to chair and sit to stand with min assist. PT Short Term Goal 3 - Progress (Week 1): Met PT Short Term Goal 4 (Week 1): Pt will stand while performing upper extremity task with min assist for balance. PT Short Term Goal 4 - Progress (Week 1):  Met Week 2:  PT Short Term Goal 1 (Week 2): Patient will perform bed mobility on regular bed with supervision. PT Short Term Goal 2 (Week 2): Patient will perform transfers bed <> wheelchair with supervision and min verbal cues for technique.  PT Short Term Goal 3 (Week 2): Patient will ambulate 50 ft using LRAD with supervision. PT Short Term Goal 4 (Week 2): Patient will negotiate up/down 4 steps with 1 rail and mod A.   Therapy Documentation Precautions:  Precautions Precautions: Fall Precaution Comments: L inattention, L sided weakness Restrictions Weight Bearing Restrictions: No   Ward, Murray Hodgkins 12/24/2016, 10:06 AM

## 2016-12-24 NOTE — Progress Notes (Signed)
[place] PHYSICAL MEDICINE & REHABILITATION     PROGRESS NOTE  Subjective/Complaints:  Pt seen initially in her room and later with therapies.  Overnight, she woke up 1 time with pain, but it resolved with tylenol and she went back to sleep.      ROS:  Denies CP, SOB, N/V/D.  Objective: Vital Signs: Blood pressure 108/64, pulse 68, temperature 98.1 F (36.7 C), temperature source Oral, resp. rate 18, height 5' (1.524 m), weight 99.1 kg (218 lb 8 oz), SpO2 98 %. No results found.  Recent Labs  12/22/16 0643 12/24/16 0510  WBC 8.5 8.7  HGB 14.8 13.6  HCT 44.2 41.0  PLT 202 234    Recent Labs  12/22/16 0643 12/24/16 0510  NA 138 139  K 4.0 3.9  CL 103 107  GLUCOSE 94 97  BUN 18 15  CREATININE 0.76 0.65  CALCIUM 9.7 9.5   CBG (last 3)  No results for input(s): GLUCAP in the last 72 hours.  Wt Readings from Last 3 Encounters:  12/23/16 99.1 kg (218 lb 8 oz)  12/15/16 104.3 kg (230 lb)  03/10/15 108.9 kg (240 lb)    Physical Exam:  BP 108/64 (BP Location: Left Arm)   Pulse 68   Temp 98.1 F (36.7 C) (Oral)   Resp 18   Ht 5' (1.524 m)   Wt 99.1 kg (218 lb 8 oz)   SpO2 98%   BMI 42.67 kg/m  Constitutional: She appears well-developed and well-nourished.  HENT: Normocephalic and atraumatic.  Eyes: EOMI. No discharge.  Cardiovascular: RRR. No JVD. Respiratory: Effort normal and breath sounds normal.  GI: Soft. Bowel sounds are normal.  Musculoskeletal: She exhibits no edema or tenderness.  Neurological: She is alert and oriented.  Left facial droop with mild dysarthria.  Reasonable insight and awareness into deficits.  Motor: LUE: deltoid 3-/5 proximal to distal (unchanged). LLE: 4-/5 HF, 4-/5 KE, ADF/DP 4/5  Skin: Skin is warm and dry. Intact.   Assessment/Plan: 1. Functional deficits secondary to right post limb internal capsule infarct which require 3+ hours per day of interdisciplinary therapy in a comprehensive inpatient rehab setting. Physiatrist  is providing close team supervision and 24 hour management of active medical problems listed below. Physiatrist and rehab team continue to assess barriers to discharge/monitor patient progress toward functional and medical goals.  Function:  Bathing Bathing position Bathing activity did not occur:  (pt had shower on day shift) Position: Wheelchair/chair at sink  Bathing parts Body parts bathed by patient: Left arm, Chest, Abdomen, Front perineal area, Buttocks, Right upper leg, Left upper leg, Right lower leg, Left lower leg Body parts bathed by helper: Right arm  Bathing assist Assist Level: Touching or steadying assistance(Pt > 75%) Assistive Device Comment: long-handled sponge    Upper Body Dressing/Undressing Upper body dressing   What is the patient wearing?: Pull over shirt/dress Bra - Perfomed by patient: Thread/unthread right bra strap, Thread/unthread left bra strap Bra - Perfomed by helper: Hook/unhook bra (pull down sports bra) Pull over shirt/dress - Perfomed by patient: Thread/unthread right sleeve, Thread/unthread left sleeve, Put head through opening Pull over shirt/dress - Perfomed by helper: Pull shirt over trunk        Upper body assist Assist Level: Touching or steadying assistance(Pt > 75%)      Lower Body Dressing/Undressing Lower body dressing   What is the patient wearing?: Underwear, Pants, Non-skid slipper socks Underwear - Performed by patient: Thread/unthread right underwear leg Underwear - Performed by  helper: Thread/unthread left underwear leg, Pull underwear up/down Pants- Performed by patient: Thread/unthread right pants leg Pants- Performed by helper: Thread/unthread left pants leg, Pull pants up/down   Non-skid slipper socks- Performed by helper: Don/doff right sock, Don/doff left sock                  Lower body assist Assist for lower body dressing:  (Max A)      Toileting Toileting   Toileting steps completed by patient: Adjust  clothing prior to toileting, Performs perineal hygiene Toileting steps completed by helper: Adjust clothing after toileting Toileting Assistive Devices: Grab bar or rail  Toileting assist Assist level: Touching or steadying assistance (Pt.75%)   Transfers Chair/bed transfer   Chair/bed transfer method: Stand pivot Chair/bed transfer assist level: Touching or steadying assistance (Pt > 75%) Chair/bed transfer assistive device: Armrests, Medical sales representative     Max distance: 80 ft Assist level: Touching or steadying assistance (Pt > 75%)   Wheelchair   Type: Manual Max wheelchair distance: 118ft Assist Level: Moderate assistance (Pt 50 - 74%)  Cognition Comprehension Comprehension assist level: Understands complex 90% of the time/cues 10% of the time  Expression Expression assist level: Expresses complex 90% of the time/cues < 10% of the time  Social Interaction Social Interaction assist level: Interacts appropriately 90% of the time - Needs monitoring or encouragement for participation or interaction.  Problem Solving Problem solving assist level: Solves basic 90% of the time/requires cueing < 10% of the time  Memory Memory assist level: Recognizes or recalls 90% of the time/requires cueing < 10% of the time     Medical Problem List and Plan:  1. 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  Cont CIR  Bracing ordered  Fluoxetine started 1/12 2. DVT Prophylaxis/Anticoagulation: Pharmaceutical: Lovenox  3. Back pain/RLE neuropathy/Pain Management:   Neurontin increased to 300 TID on 1/14  Robaxin PRN started 1/12  CT reviewed from 2015 showing L4-5 post-surgical changes, L3-4 stable impingement.   L-spine Xrays 1/15 reviewed, showing stable post-surgical changes L4-5  Lidoderm patches x2 started 1/16 4. Mood: team to provide ego support. LCSW to follow for evaluation and support.  5. Neuropsych: This patient is capable of  making decisions on her own behalf.  6. Skin/Wound Care: routine pressure relief measures.  7. Fluids/Electrolytes/Nutrition: Monitor I/O.   Dysphag 3 thin, advance as tolerated.  BMP WNL 1/18 8. [id number] BP bid  Controlled 1/18 9. Urinary retention: Improving  UA neg, Ucx NG.   Scheduled toileting--monitor voiding with PVR neg.  10. Chronic constipation: Uses suppository every other day. Added senna S at bedtime with suppository daily as needed.  11. Hypokalemia : Likely dilutional-- supplemented 1/11.   WNL on 1/18 12. Dyslipidemia: On zocor  13. Tobacco abuse: Nicotine patch. Encourage cessation.  14. H/o Gout: With flare  Colchicine started 1/17  Improving 15. Right knee pain  Voltaren gel ordered 1/17  Cont Robaxin PRN  Improving     LOS (Days) 7 A FACE TO FACE EVALUATION WAS PERFORMED  Ankit Lorie Phenix 12/24/2016 9:58 AM

## 2016-12-24 NOTE — Progress Notes (Signed)
Physical Therapy Session Note  Patient Details  Name: Ellen Henry MRN: [id number] Date of Birth: 1946/10/06  Today's Date: 12/24/2016 PT Individual Time: 1420-1500 PT Individual Time Calculation (min): 40 min   Short Term Goals: Week 2:  PT Short Term Goal 1 (Week 2): Patient will perform bed mobility on regular bed with supervision. PT Short Term Goal 2 (Week 2): Patient will perform transfers bed <> wheelchair with supervision and min verbal cues for technique.  PT Short Term Goal 3 (Week 2): Patient will ambulate 50 ft using LRAD with supervision. PT Short Term Goal 4 (Week 2): Patient will negotiate up/down 4 steps with 1 rail and mod A.   Skilled Therapeutic Interventions/Progress Updates: Pt received seated in w/c, denies pain and agreeable to treatment. Gait x50' with two turns and x90' with several turns with minA/min guard; facilitation at L glutes for hip extension in stance. Requires cues for attention to L hand to don/doff orthosis. Stand pivot transfer w/c <>nustep with minA. Performed nustep LEs only x7 min total for BLE coordination and LLE NMR. Requires several rest breaks due to fatigue, and resistance decreased from level 6 to level 4 over the course of activity due to fatigue. Remained seated in w/c at end of session, all needs in reach.      Therapy Documentation Precautions:  Precautions Precautions: Fall Precaution Comments: L inattention, L sided weakness Restrictions Weight Bearing Restrictions: No   See Function Navigator for Current Functional Status.   Therapy/Group: Individual Therapy  Luberta Mutter 12/24/2016, 3:18 PM

## 2016-12-24 NOTE — Progress Notes (Signed)
Occupational Therapy Session Note  Patient Details  Name: LITISHA MOHLMAN MRN: [id number] Date of Birth: 07-20-1946  Today's Date: 12/24/2016 OT Individual Time: 1115-1200 OT Individual Time Calculation (min): 45 min    Short Term Goals: Week 1:  OT Short Term Goal 1 (Week 1): Pt will maintain static standing balance for 1 minutes with min A at RW/ sink in prep for functional task OT Short Term Goal 2 (Week 1): Pt will recall hemi-dressing techniques 2 consecutive days with min questioning cues OT Short Term Goal 3 (Week 1): Pt will complete sit<>stand with overall Min A un preparation for LB dressing tasks  Skilled Therapeutic Interventions/Progress Updates:    Pt seen for OT ADL bathing/dressing session. Pt sitting up in w/c upon arrival, had already gathered clothing and ready for shower. She ambulated into bathroom with min A, one LOB to R pt able to self correct with min A.  Toilet and shower transfers completed with min A and assist for RW management during functional transfers.  She bathed seated on tub bench. With hand over hand assist, pt able to wash under R arm, however, requested therapist assist for thoroughness.  She dressed seated in w/c, able to recall hemi dressing techniques independently. Pt able to cross L ankle over knee and with steadying assist from therapist to maintain LE position, pt able to don L sock. Increased assist required for R sock due to limited ROM/ body habitus. Pt left sitting in w/c at sink completing grooming tasks, all needs in reach.   Therapy Documentation Precautions:  Precautions Precautions: Fall Precaution Comments: Inattention to L arm and L sided weakness Restrictions Weight Bearing Restrictions: No Pain: Pain Assessment Pain Assessment: 0-10 Pain Score: 0-No pain   See Function Navigator for Current Functional Status.   Therapy/Group: Individual Therapy  Lewis, Amy C 12/24/2016, 7:15 AM

## 2016-12-24 NOTE — Progress Notes (Signed)
Physical Therapy Session Note  Patient Details  Name: Ellen Henry MRN: [id number] Date of Birth: [birth year]  Today's Date: 12/24/2016 PT Concurrent Time: 0906-1007 PT Concurrent Time Calculation (min): 61 min     Therapy Documentation Precautions:  Precautions Precautions: Fall Precaution Comments: L inattention, L sided weakness Restrictions Weight Bearing Restrictions: No  Lower body dressing mod assist.  Sit to and from stand transfer min assist with use of RW   Patient ambulated 125 feet with RW with left built up handle min assist. Patient ambulated with a step through gait pattern. Verbal cues for upright posture and increased step length on the left. Occasional LOB to the left secondary to poor foot clearance.    Patient up and down 8 steps with bilateral handrails min assist. Patient educated on proper sequence and technique  Nu-stp level 4 12 minutes  Seated there ex: B LAQ 3x10 B hip flex3x10 B hip abduction 3x10 All performed with 3 pound cuff weights.   Patient handed off directly to SLP Happi for continued treatment.   See Function Navigator for Current Functional Status.   Therapy/Group: Individual Therapy  Retta Diones 12/24/2016, 11:45 AM

## 2016-12-24 NOTE — Progress Notes (Signed)
Speech Language Pathology Daily Session Note  Patient Details  Name: Ellen Henry MRN: [id number] Date of Birth: 24-May-1946  Today's Date: 12/24/2016 SLP Individual Time: 1009-1109 SLP Individual Time Calculation (min): 60 min  Short Term Goals: Week 1: SLP Short Term Goal 1 (Week 1): Pt will consume dys 3 textures and thin liquids with mod I use of swallowing precautions over 2 targeted sessions.   SLP Short Term Goal 1 - Progress (Week 1): Met SLP Short Term Goal 2 (Week 1): Pt will consume therapeutic trials of regular textures with mod I use of swallowing precautions over 2 targeted sessions prior to advancement.  SLP Short Term Goal 2 - Progress (Week 1): Met SLP Short Term Goal 3 (Week 1): Pt will solve mildly complex problems Mod I for errors correction   SLP Short Term Goal 4 (Week 1): Pt will consume regular textures and thin liquids with Mod I use of safe swallow strateiges over 2 targeted sessions.   Skilled Therapeutic Interventions: Skilled treatment session focused on cognition goals. SLP facilitated session by providing Mod I for solving mildly complex to semi-complex problems such as calendar organization tasks and multiple step deductive reasoning tasks. Pt was returned to room, left upright in wheelchair with all needs within reach. Continue per current plan of care.      Function:  Eating Eating   Modified Consistency Diet: Yes Eating Assist Level: More than reasonable amount of time           Cognition Comprehension Comprehension assist level: Follows complex conversation/direction with extra time/assistive device  Expression   Expression assist level: Expresses complex ideas: With extra time/assistive device  Social Interaction Social Interaction assist level: Interacts appropriately with others with medication or extra time (anti-anxiety, antidepressant).  Problem Solving Problem solving assist level: Solves complex 90% of the time/cues < 10% of the time   Memory Memory assist level: More than reasonable amount of time    Pain    Therapy/Group: Individual Therapy  [name] 12/24/2016, 12:04 PM

## 2016-12-25 ENCOUNTER — Inpatient Hospital Stay (HOSPITAL_COMMUNITY): Payer: Medicare Other | Admitting: Speech Pathology

## 2016-12-25 ENCOUNTER — Inpatient Hospital Stay (HOSPITAL_COMMUNITY): Payer: Self-pay | Admitting: Occupational Therapy

## 2016-12-25 ENCOUNTER — Inpatient Hospital Stay (HOSPITAL_COMMUNITY): Payer: Medicare Other | Admitting: Physical Therapy

## 2016-12-25 NOTE — Progress Notes (Signed)
Speech Language Pathology Weekly Progress and Session Note  Patient Details  Name: Ellen Henry MRN: [id number] Date of Birth: 11/11/1946  Beginning of progress report period: December 18, 2016 End of progress report period: December 25, 2016  Today's Date: 12/25/2016 SLP Individual Time: 1530-1600 SLP Individual Time Calculation (min): 30 min  Short Term Goals: Week 1: SLP Short Term Goal 1 (Week 1): Pt will consume dys 3 textures and thin liquids with mod I use of swallowing precautions over 2 targeted sessions.   SLP Short Term Goal 1 - Progress (Week 1): Met SLP Short Term Goal 2 (Week 1): Pt will consume therapeutic trials of regular textures with mod I use of swallowing precautions over 2 targeted sessions prior to advancement.  SLP Short Term Goal 2 - Progress (Week 1): Met SLP Short Term Goal 3 (Week 1): Pt will solve mildly complex problems Mod I for errors correction   SLP Short Term Goal 3 - Progress (Week 1): Met SLP Short Term Goal 4 (Week 1): Pt will consume regular textures and thin liquids with Mod I use of safe swallow strateiges over 2 targeted sessions.  SLP Short Term Goal 4 - Progress (Week 1): Met    New Short Term Goals: Week 2: SLP Short Term Goal 1 (Week 2): Pt will solve complex problems Mod I for errors correction   SLP Short Term Goal 2 (Week 2): Pt will demonstrate divided attention during a complex task for 30 minutes with supervision cues.  SLP Short Term Goal 3 (Week 2): Pt will demonstrate anticipatory awareness and identify at least 3 tasks she can partiicpate in at home safely with supervision cues.   Weekly Progress Updates: Pt has made functional gains and has met 4 out of 4 STG's this reporting period due to improved swallowing and cognition. Pt is currently consuming regular diet textures with thin liquids with intermittent supervision for use of compensatory swallow strategies. 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      Intensity: Minumum of 1-2 x/day, 30 to 90 minutes Frequency: 3 to 5 out of 7 days Duration/Length of Stay: 2/1 Treatment/Interventions: Cognitive remediation/compensation   Daily Session  Skilled Therapeutic Interventions: Skilled treatment focused on cognition goals. SLP facilitated session by providing supervision cues for problem solving mildly complex problems. Pt was returned to room, left upright in her wheelchair with all needs with all needs within reach. Continue current plan of care.      Function:   Cognition Comprehension Comprehension assist level: Follows basic conversation/direction with no assist  Expression   Expression assist level: Expresses complex ideas: With extra time/assistive device  Social Interaction Social Interaction assist level: Interacts appropriately with others - No medications needed.  Problem Solving Problem solving assist level: Solves complex 90% of the time/cues < 10% of the time  Memory Memory assist level: Recognizes or recalls 90% of the time/requires cueing < 10% of the time   General    Pain    Therapy/Group: Individual Therapy  [name] 12/25/2016, 4:57 PM         

## 2016-12-25 NOTE — Progress Notes (Signed)
Physical Therapy Session Note  Patient Details  Name: Ellen Henry MRN: [id number] Date of Birth: 12-23-1945  Today's Date: 12/25/2016 PT Individual Time: 0902-1002 PT Individual Time Calculation (min): 60 min   Short Term Goals: Week 1:  PT Short Term Goal 1 (Week 1): Pt will perform bed mobility and supine to sit with modified independence. PT Short Term Goal 1 - Progress (Week 1): Progressing toward goal PT Short Term Goal 2 (Week 1): Pt will ambulate 25 ft wtih least restrictive device and min assist. PT Short Term Goal 2 - Progress (Week 1): Met PT Short Term Goal 3 (Week 1): Pt will perform mat to chair and sit to stand with min assist. PT Short Term Goal 3 - Progress (Week 1): Met PT Short Term Goal 4 (Week 1): Pt will stand while performing upper extremity task with min assist for balance. PT Short Term Goal 4 - Progress (Week 1): Met Week 2:  PT Short Term Goal 1 (Week 2): Patient will perform bed mobility on regular bed with supervision. PT Short Term Goal 2 (Week 2): Patient will perform transfers bed <> wheelchair with supervision and min verbal cues for technique.  PT Short Term Goal 3 (Week 2): Patient will ambulate 50 ft using LRAD with supervision. PT Short Term Goal 4 (Week 2): Patient will negotiate up/down 4 steps with 1 rail and mod A.   Skilled Therapeutic Interventions/Progress Updates:    Pt was found in room sitting in W/C. Pt performed sit-to-stand with walker to don pants was CGA. Therapist threaded pants for pt but required no additional assist to pull up pants. Therapist donned shoes for pt. Pt continues to have pain and discomfort from gout on R foot.   Pt self-propelled in W/C with supervision and R hemi pattern 200 ft with several rest breaks to reach rehab gym.   Pt ascended and descended eight 3 inch steps once with CGA while only using R hand rail to mimic home environment. Pt ascended and descended eight 6 inch steps twice with CGA - min A and only  using R handrail again. Pt used a step-to pattern when ascending stairs while instructed to use a side-stepping patten facing the R hand rail when descending stairs.   Pt ambulated 50 ft and 100 ft CGA that progressed to supervision with RW and handsplint.   Sit-to-stand transfers throughout therapy were CGA that progressed to supervision with occasional verbal cues for sequencing and reminders to unfasten handsplit and bring LUE with her when sitting down.   Pt was rolled back to room by therapist and was left in W/C with all needs within reach.   Therapy Documentation Precautions:  Precautions Precautions: Fall Precaution Comments: L inattention, L sided weakness Restrictions Weight Bearing Restrictions: No    Pain: Pt didn't complain of pain during treatment.    See Function Navigator for Current Functional Status.   Therapy/Group: Individual Therapy  Rosendo Gros 12/25/2016, 9:35 AM

## 2016-12-25 NOTE — Progress Notes (Signed)
[facility] PHYSICAL MEDICINE & REHABILITATION     PROGRESS NOTE  Subjective/Complaints:  Patient seen in her room. She is trying to get some close together for ADL program. She is unable to propel her wheelchair backwards, however No pain complaints or problems with sleeping last night. No problems identified in PT or OT yesterday.     ROS:  Denies CP, SOB, N/V/D.  Objective: Vital Signs: Blood pressure 131/75, pulse 64, temperature 97.8 F (36.6 C), temperature source Oral, resp. rate 18, height 5' (1.524 m), weight 99.1 kg (218 lb 8 oz), SpO2 97 %. No results found.  Recent Labs  12/24/16 0510  WBC 8.7  HGB 13.6  HCT 41.0  PLT 234    Recent Labs  12/24/16 0510  NA 139  K 3.9  CL 107  GLUCOSE 97  BUN 15  CREATININE 0.65  CALCIUM 9.5   CBG (last 3)  No results for input(s): GLUCAP in the last 72 hours.  Wt Readings from Last 3 Encounters:  12/23/16 99.1 kg (218 lb 8 oz)  12/15/16 104.3 kg (230 lb)  03/10/15 108.9 kg (240 lb)    Physical Exam:  BP 131/75 (BP Location: Left Arm)   Pulse 64   Temp 97.8 F (36.6 C) (Oral)   Resp 18   Ht 5' (1.524 m)   Wt 99.1 kg (218 lb 8 oz)   SpO2 97%   BMI 42.67 kg/m  Constitutional: She appears well-developed and well-nourished.  HENT: Normocephalic and atraumatic.  Eyes: EOMI. No discharge.  Cardiovascular: RRR. No JVD. Respiratory: Effort normal and breath sounds normal.  GI: Soft. Bowel sounds are normal.  Musculoskeletal: She exhibits no edema or tenderness.  Neurological: She is alert and oriented.  Left facial droop with mild dysarthria.  Reasonable insight and awareness into deficits.  Motor: LUE: deltoid 3-/5 proximal to distal (unchanged). LLE: 4-/5 HF, 4-/5 KE, ADF/DP 4/5  Skin: Skin is warm and dry. Intact.   Assessment/Plan: 1. 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 Physiatrist is providing close team supervision and 24 hour management of active medical problems listed below. Physiatrist and rehab team continue to assess barriers to discharge/monitor patient progress toward functional and medical goals.  Function:  Bathing Bathing position Bathing activity did not occur:  (pt had shower on day shift) Position: Shower  Bathing parts Body parts bathed by patient: Left arm, Chest, Abdomen, Front perineal area, Buttocks, Right upper leg, Left upper leg, Right lower leg, Left lower leg Body parts bathed by helper: Right arm  Bathing assist Assist Level: Touching or steadying assistance(Pt > 75%) Assistive Device Comment: long-handled sponge    Upper Body Dressing/Undressing Upper body dressing   What is the patient wearing?: Pull over shirt/dress, Bra Bra - Perfomed by patient: Thread/unthread right bra strap, Thread/unthread left bra strap Bra - Perfomed by helper: Hook/unhook bra (pull down sports bra) Pull over shirt/dress - Perfomed by patient: Thread/unthread right sleeve, Thread/unthread left sleeve, Put head through opening, Pull shirt over trunk Pull over shirt/dress - Perfomed by helper: Pull shirt over trunk        Upper body assist Assist Level: Touching or steadying assistance(Pt > 75%)      Lower Body Dressing/Undressing Lower body dressing   What is the patient wearing?: Underwear, Pants, Non-skid slipper socks Underwear - Performed by patient: Thread/unthread left underwear leg, Thread/unthread right underwear leg, Pull  underwear up/down Underwear - Performed by helper: Thread/unthread left underwear leg, Pull underwear up/down Pants- Performed by patient: Thread/unthread right pants leg, Thread/unthread left pants leg Pants- Performed by helper: Pull pants up/down Non-skid slipper socks- Performed by patient: Don/doff right sock, Don/doff left sock Non-skid slipper socks- Performed by helper: Don/doff right sock, Don/doff  left sock                  Lower body assist Assist for lower body dressing: Touching or steadying assistance (Pt > 75%)      Toileting Toileting   Toileting steps completed by patient: Performs perineal hygiene Toileting steps completed by helper: Adjust clothing prior to toileting, Adjust clothing after toileting Toileting Assistive Devices: Grab bar or rail  Toileting assist Assist level: Touching or steadying assistance (Pt.75%)   Transfers Chair/bed transfer   Chair/bed transfer method: Stand pivot Chair/bed transfer assist level: Touching or steadying assistance (Pt > 75%) Chair/bed transfer assistive device: Armrests, Medical sales representative     Max distance: 90 Assist level: Touching or steadying assistance (Pt > 75%)   Wheelchair   Type: Manual Max wheelchair distance: 180ft Assist Level: Moderate assistance (Pt 50 - 74%)  Cognition Comprehension Comprehension assist level: Follows basic conversation/direction with no assist  Expression Expression assist level: Expresses complex ideas: With extra time/assistive device  Social Interaction Social Interaction assist level: Interacts appropriately with others - No medications needed.  Problem Solving Problem solving assist level: Solves complex 90% of the time/cues < 10% of the time  Memory Memory assist level: Recognizes or recalls 90% of the time/requires cueing < 10% of the time     Medical Problem List and Plan:  1. 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  Cont CIRPT, OT, speech therapy  Bracing ordered  Fluoxetine started 1/12 2. DVT Prophylaxis/Anticoagulation: Pharmaceutical: Lovenox  3. Back pain/RLE neuropathy/Pain Management:   Neurontin increased to 300 TID on 1/14  Robaxin PRN started 1/12  CT reviewed from 2015 showing L4-5 post-surgical changes, L3-4 stable impingement.   L-spine Xrays 1/15 reviewed, showing stable post-surgical changes  L4-5  Lidoderm patches x2 started 1/16, and this is helping while they're on and also until about 4 AM. 4. Mood: team to provide ego support. LCSW to follow for evaluation and support.  5. Neuropsych: This patient is capable of making decisions on her own behalf.  6. Skin/Wound Care: routine pressure relief measures.  7. Fluids/Electrolytes/Nutrition: Monitor I/O.   Dysphag 3 thin, advance as tolerated.  BMP WNL 1/18, no clinical signs of dehydration 8. [id number] BP bid  Controlled 1/19, 131/75 9. Urinary retention: Improving  UA neg, Ucx NG.   Scheduled toileting--monitor voiding with PVR neg.  10. Chronic constipation: Uses suppository every other day. Added senna S at bedtime with suppository daily as needed.  11. Hypokalemia : Likely dilutional-- supplemented 1/11.   WNL on 1/18 12. Dyslipidemia: On zocor  13. Tobacco abuse: Nicotine patch. Encourage cessation.  14. H/o Gout: No further evidence of FLAIR  Colchicine started 1/17  Improving 15. Right knee pain  Voltaren gel ordered 1/17  Cont Robaxin PRN  Improving     LOS (Days) 8 A FACE TO FACE EVALUATION WAS PERFORMED  Alysia Penna E 12/25/2016 11:15 AM

## 2016-12-25 NOTE — Progress Notes (Addendum)
Occupational Therapy Weekly Progress Note  Patient Details  Name: Ellen Henry MRN: [id number] Date of Birth: Jul 03, 1946  Beginning of progress report period: December 18, 2016 End of progress report period: December 26, 2015  Today's Date: 12/25/2016  Session 1 OT Individual Time: 1000-1100 OT Individual Time Calculation (min): 60 min   Session 2 OT Individual Time: 1300-1400 OT Individual Time Calculation (min): 60 min    Patient has met 3 of 3 short term goals 2/2 improved strength, activity tolerance, and improved functional use of L Ue. Pt is making steady progress with OT treatments at this time. She is able to complete all transfers with use of the RW and min guard assist.  LUE function continues to improve to a min A level for bathing and dressing tasks.  She continues to need Max A to don socks and shoes 2/2 back pain and difficulty bending to reach.  min A/supervision for sit to to stand with min instructional cueing for hand placement.  Will continue with current OT treatment POC.    Patient continues to demonstrate the following deficits: muscle weakness, impaired timing and sequencing and decreased coordination and decreased standing balance, decreased postural control, hemiplegia and decreased balance strategies and therefore will continue to benefit from skilled OT intervention to enhance overall performance with BADL.  Patient progressing toward long term goals..  Continue plan of care.  OT Short Term Goals Week 1:  OT Short Term Goal 1 (Week 1): Pt will maintain static standing balance for 1 minutes with min A at RW/ sink in prep for functional task OT Short Term Goal 1 - Progress (Week 1): Met OT Short Term Goal 2 (Week 1): Pt will recall hemi-dressing techniques 2 consecutive days with min questioning cues OT Short Term Goal 2 - Progress (Week 1): Met OT Short Term Goal 3 (Week 1): Pt will complete sit<>stand with overall Min A un preparation for LB dressing tasks OT  Short Term Goal 3 - Progress (Week 1): Met Week 2:  OT Short Term Goal 1 (Week 2): Pt will use L UE for bathing tasks with contact gaurd A OT Short Term Goal 2 (Week 2): Pt will pull pants up using L UE with supervision OT Short Term Goal 3 (Week 2): Pt will don socks with Min A and ADL AE as needed  Skilled Therapeutic Interventions/Progress Updates:  Session 1   1:1 OT session focused on modified bathing/dressing,  mobility/functional transfers, improved sit,.stand, and functional use of L Ue. Pt ambulated to bathroom w/ min A and RW transferred onto toilet with Min A. +bladder, managed clothing and completed peri-care with supervision. Ambulated to shower with Min A and verbal cues for walker positioning. Shower level bathing with focus on integrating L UE during functional tasks. Improved strength/ROM of L UE to wash R UE with Min A. Pt then completed Dressing at the sink with he I-techniques and overall Min/Mod A. Pt left seated in recliner at end of session with needs met.   Session 2 1:1 OT session focused on L NMR, functional use of L Ue, and fine-motor coordination. Pt ambulated 10 feet in room w/ min A and RW .  L UE NMR incorporating PNF patterns. L UE fine motor strength/coordination with focus on pinch strength with Mod A to facilitate normal movemement patterns using graded clothes pin task. In-hand manipulation with focus on palmer translation and rotation with graded peg board activity. Pt returned to room at end of session and left  seated in wc with needs met.    Therapy Documentation Precautions:  Precautions Precautions: Fall Precaution Comments: L inattention, L sided weakness Restrictions Weight Bearing Restrictions: No Pain: Pain Assessment Pain Assessment: 0-10 Pain Score: 6  Pain Type: Chronic pain Pain Location: Back Pain Orientation: Lower Pain Descriptors / Indicators: Aching Pain Onset: On-going Pain Intervention(s): Repositioned ADL: ADL ADL Comments:  please see functional navigator  See Function Navigator for Current Functional Status.   Therapy/Group: Individual Therapy  [name] 12/25/2016, 3:44 PM

## 2016-12-26 ENCOUNTER — Inpatient Hospital Stay (HOSPITAL_COMMUNITY): Payer: Self-pay | Admitting: Occupational Therapy

## 2016-12-26 NOTE — Progress Notes (Signed)
[facility] PHYSICAL MEDICINE & REHABILITATION     PROGRESS NOTE  Subjective/Complaints:  No new issues. Had a good night. No new complaints.   ROS: pt denies nausea, vomiting, diarrhea, cough, shortness of breath or chest pain  Objective: Vital Signs: Blood pressure 120/73, pulse 71, temperature 98 F (36.7 C), temperature source Oral, resp. rate 18, height 5' (1.524 m), weight 99.1 kg (218 lb 8 oz), SpO2 99 %. No results found.  Recent Labs  12/24/16 0510  WBC 8.7  HGB 13.6  HCT 41.0  PLT 234    Recent Labs  12/24/16 0510  NA 139  K 3.9  CL 107  GLUCOSE 97  BUN 15  CREATININE 0.65  CALCIUM 9.5   CBG (last 3)  No results for input(s): GLUCAP in the last 72 hours.  Wt Readings from Last 3 Encounters:  12/23/16 99.1 kg (218 lb 8 oz)  12/15/16 104.3 kg (230 lb)  03/10/15 108.9 kg (240 lb)    Physical Exam:  BP 120/73 (BP Location: Left Arm)   Pulse 71   Temp 98 F (36.7 C) (Oral)   Resp 18   Ht 5' (1.524 m)   Wt 99.1 kg (218 lb 8 oz)   SpO2 99%   BMI 42.67 kg/m  Constitutional: She appears well-developed and well-nourished.  HENT: Normocephalic and atraumatic.  Eyes: EOMI. No discharge.  Cardiovascular: RRR. Respiratory: Effort normal and breath sounds normal.  GI: Soft. Bowel sounds are normal.  Musculoskeletal: She exhibits no edema or tenderness.  Neurological: She is alert and oriented.  Left facial droop with mild dysarthria.  Reasonable insight and awareness into deficits.  Motor: LUE: deltoid 3-/5 proximal to distal (stable). LLE: 4-/5 HF, 4-/5 KE, ADF/DP 4/5  Skin: Skin is warm and dry. Intact.   Assessment/Plan: 1. Functional deficits secondary to right post limb internal capsule infarct which require 3+ hours per day of interdisciplinary therapy in a comprehensive inpatient rehab setting. Physiatrist is providing close team supervision and 24 hour management of active medical problems listed below. Physiatrist and rehab team continue to  assess barriers to discharge/monitor patient progress toward functional and medical goals.  Function:  Bathing Bathing position Bathing activity did not occur:  (pt had shower on day shift) Position: Shower  Bathing parts Body parts bathed by patient: Left arm, Chest, Abdomen, Front perineal area, Buttocks, Right upper leg, Left upper leg, Right lower leg, Left lower leg Body parts bathed by helper: Right arm  Bathing assist Assist Level: Touching or steadying assistance(Pt > 75%) Assistive Device Comment: long-handled sponge    Upper Body Dressing/Undressing Upper body dressing   What is the patient wearing?: Pull over shirt/dress, Bra Bra - Perfomed by patient: Thread/unthread left bra strap, Thread/unthread right bra strap Bra - Perfomed by helper: Hook/unhook bra (pull down sports bra) Pull over shirt/dress - Perfomed by patient: Thread/unthread right sleeve, Thread/unthread left sleeve, Put head through opening, Pull shirt over trunk Pull over shirt/dress - Perfomed by helper: Pull shirt over trunk        Upper body assist Assist Level: Touching or steadying assistance(Pt > 75%)      Lower Body Dressing/Undressing Lower body dressing   What is the patient wearing?: Pants, Shoes Underwear - Performed by patient: Thread/unthread left underwear leg, Thread/unthread right underwear leg, Pull underwear up/down Underwear - Performed by helper: Thread/unthread left underwear leg, Pull underwear up/down Pants- Performed by patient: Thread/unthread right pants leg, Pull pants up/down Pants- Performed by helper: Thread/unthread left pants  leg Non-skid slipper socks- Performed by patient: Don/doff left sock Non-skid slipper socks- Performed by helper: Don/doff left sock, Don/doff right sock       Shoes - Performed by helper: Don/doff right shoe, Don/doff left shoe, Fasten right, Fasten left          Lower body assist Assist for lower body dressing: Touching or steadying assistance  (Pt > 75%)      Toileting Toileting   Toileting steps completed by patient: Adjust clothing prior to toileting, Performs perineal hygiene, Adjust clothing after toileting Toileting steps completed by helper: Adjust clothing prior to toileting, Adjust clothing after toileting Toileting Assistive Devices: Grab bar or rail  Toileting assist Assist level: Touching or steadying assistance (Pt.75%)   Transfers Chair/bed transfer   Chair/bed transfer method: Stand pivot Chair/bed transfer assist level: Touching or steadying assistance (Pt > 75%) Chair/bed transfer assistive device: Armrests, Medical sales representative     Max distance: 100 ft Assist level: Touching or steadying assistance (Pt > 75%)   Wheelchair   Type: Manual Max wheelchair distance: 130 ft Assist Level: Supervision or verbal cues  Cognition Comprehension Comprehension assist level: Follows basic conversation/direction with no assist  Expression Expression assist level: Expresses complex ideas: With extra time/assistive device  Social Interaction Social Interaction assist level: Interacts appropriately with others - No medications needed.  Problem Solving Problem solving assist level: Solves basic 90% of the time/requires cueing < 10% of the time  Memory Memory assist level: Recognizes or recalls 90% of the time/requires cueing < 10% of the time     Medical Problem List and Plan:  1. 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  Cont CIRPT, OT, speech therapy  Bracing ordered  Fluoxetine started 1/12 2. DVT Prophylaxis/Anticoagulation: Pharmaceutical: Lovenox  3. Back pain/RLE neuropathy/Pain Management: improved control  Neurontin increased to 300 TID on 1/14  Robaxin PRN started 1/12  CT reviewed from 2015 showing L4-5 post-surgical changes, L3-4 stable impingement.   L-spine Xrays 1/15 reviewed, showing stable post-surgical changes L4-5  Lidoderm patches x2 started  1/16, and this is helping while they're on and also until about 4 AM. 4. Mood: team to provide ego support. LCSW to follow for evaluation and support.  5. Neuropsych: This patient is capable of making decisions on her own behalf.  6. Skin/Wound Care: routine pressure relief measures.  7. Fluids/Electrolytes/Nutrition: Monitor I/O.   Dysphag 3 thin, advance as tolerated.  BMP WNL 1/18, no clinical signs of dehydration 8. [id number] BP bid  Controlled 1/19, 131/75 9. Urinary retention: Improving  UA neg, Ucx NG.   Scheduled toileting--monitor voiding with PVR neg.  10. Chronic constipation: Uses suppository every other day. Added senna S at bedtime with suppository daily as needed.  11. Hypokalemia : Likely dilutional-- supplemented 1/11.   WNL on 1/18 12. Dyslipidemia: On zocor  13. Tobacco abuse: Nicotine patch. Encourage cessation.  14. H/o Gout: No further evidence of FLAIR  Colchicine started 1/17  Improving 15. Right knee pain  Voltaren gel ordered 1/17  Cont Robaxin PRN  Improved     LOS (Days) 9 A FACE TO FACE EVALUATION WAS PERFORMED  Deauna Yaw T 12/26/2016 8:46 AM

## 2016-12-26 NOTE — Progress Notes (Signed)
Occupational Therapy Session Note  Patient Details  Name: Ellen Henry MRN: [id number] Date of Birth: [birth year]  Today's Date: 12/26/2016 OT Individual Time: 1000-1100 OT Individual Time Calculation (min): 60 min    Short Term Goals: Week 2:  OT Short Term Goal 1 (Week 2): Pt will use L UE for bathing tasks with contact gaurd A OT Short Term Goal 2 (Week 2): Pt will pull pants up using L UE with supervision OT Short Term Goal 3 (Week 2): Pt will don socks with Min A and ADL AE as needed  Skilled Therapeutic Interventions/Progress Updates:    1:1 OT session focused on modified bathing/dressing, functional mobility/functional transfers, improved sit<>stand, and functional use of L Ue. Pt ambulated to bathroom w/ RW and 50% supervision with 50% min guard for balance. Shower level bathing with focus on L UE integration to wash R side. Sit<>stand with overall Min/Mod A with focus on anterior weight shift when coming to stand. Pt able to grasp deodorant today and don under R arm with min elbow support. Pt also, able to thread both LE's into underwear and pants today. Pt able to achieve L UE internal rotation to assist with pulling up pants, but unable to grasp efficiently to fully complete task. Instructed on use of sock-aid- pt required Min/Mod A to thread shock onto sock aidPt left seated in wc at end of session with needs met.   Therapy Documentation Precautions:  Precautions Precautions: Fall Precaution Comments: L inattention, L sided weakness Restrictions Weight Bearing Restrictions: No Pain:  denies pain ADL: ADL ADL Comments: please see functional navigator   See Function Navigator for Current Functional Status.   Therapy/Group: Individual Therapy  [name] 12/26/2016, 10:18 AM

## 2016-12-27 ENCOUNTER — Inpatient Hospital Stay (HOSPITAL_COMMUNITY): Payer: Medicare Other | Admitting: Physical Therapy

## 2016-12-27 NOTE — Progress Notes (Signed)
Physical Therapy Session Note  Patient Details  Name: [name] MRN: [id number] Date of Birth: [birth year]  Today's Date: 12/27/2016 PT Individual Time: [id number] PT Individual Time Calculation (min): 45 min   Short Term Goals: Week 2:  PT Short Term Goal 1 (Week 2): Patient will perform bed mobility on regular bed with supervision. PT Short Term Goal 2 (Week 2): Patient will perform transfers bed <> wheelchair with supervision and min verbal cues for technique.  PT Short Term Goal 3 (Week 2): Patient will ambulate 50 ft using LRAD with supervision. PT Short Term Goal 4 (Week 2): Patient will negotiate up/down 4 steps with 1 rail and mod A.   Skilled Therapeutic Interventions/Progress Updates:     Pt was in W/C upon arrival. Pt stated having 5/10 back pain that she states was manageable. Pt agreed to continue with therapy.   Pt required supervision to don pants with sit-to-stand transfer. Shoes were donned on patient dependently to manage treatment time.   Pt self-propelled in W/C for 100 ft with R hemi pattern towards rehab gym before fatiguing.   Bed mobility with mat, stand-pivot and sit-to-stand transfers throughout treatment were supervision with minimal verbal cues for addressing positioning of affected LUE when using the handsplint on the RW.   Pt performed the following exercises on mat for NMR:   - 2x10 supine bridges with hip adduction ball squeezes.   - 1x10 supine clamshells with yellow resistance theraband and 5 sec hold.   - 1x10 sidelying clamshells with yellow resistance theraband bilaterally  Pt ambulated 95 ft supervision with RW and verbal cues for sequence and posture.   Pt was pushed back to room in W/C. Pt remained in W/C with all needs within reach.  Chair/bed transfers on mat during therapy required supervision.  Therapy Documentation Precautions:  Precautions Precautions: Fall Precaution Comments: L inattention, L sided  weakness Restrictions Weight Bearing Restrictions: No General:   See Function Navigator for Current Functional Status.   Therapy/Group: Individual Therapy  Rosendo Gros 12/27/2016, 9:05 AM

## 2016-12-27 NOTE — Progress Notes (Signed)
PHYSICAL MEDICINE & REHABILITATION     PROGRESS NOTE  Subjective/Complaints:  Reports low back pain. Kpad helps but "broke"  ROS: pt denies nausea, vomiting, diarrhea, cough, shortness of breath or chest pain   Objective: Vital Signs: Blood pressure 122/78, pulse 78, temperature 97.9 F (36.6 C), temperature source Oral, resp. rate 16, height 5' (1.524 m), weight 99.1 kg (218 lb 8 oz), SpO2 98 %. No results found. No results for input(s): WBC, HGB, HCT, PLT in the last 72 hours. No results for input(s): NA, K, CL, GLUCOSE, BUN, CREATININE, CALCIUM in the last 72 hours.  Invalid input(s): CO CBG (last 3)  No results for input(s): GLUCAP in the last 72 hours.  Wt Readings from Last 3 Encounters:  12/23/16 99.1 kg (218 lb 8 oz)  12/15/16 104.3 kg (230 lb)  03/10/15 108.9 kg (240 lb)    Physical Exam:  BP 122/78 (BP Location: Left Arm)   Pulse 78   Temp 97.9 F (36.6 C) (Oral)   Resp 16   Ht 5' (1.524 m)   Wt 99.1 kg (218 lb 8 oz)   SpO2 98%   BMI 42.67 kg/m  Constitutional: She appears well-developed and well-nourished.  HENT: Normocephalic and atraumatic.  Eyes: EOMI. No discharge.  Cardiovascular:RRR Respiratory: clear bilaterally GI: Soft. Bowel sounds are normal.  Musculoskeletal: She exhibits no edema or tenderness.  Neurological: She is alert and oriented.  Left facial droop with mild dysarthria.  Reasonable insight and awareness into deficits.  Motor: LUE: deltoid 3-/5 proximal to distal (stable). LLE: 4-/5 HF, 4-/5 KE, ADF/DP 4/5  Skin: Skin is warm and dry. Intact.   Assessment/Plan: 1. Functional deficits secondary to right post limb internal capsule infarct which require 3+ hours per day of interdisciplinary therapy in a comprehensive inpatient rehab setting. Physiatrist is providing close team supervision and 24 hour management of active medical problems listed below. Physiatrist and rehab team continue to assess barriers to discharge/monitor  patient progress toward functional and medical goals.  Function:  Bathing Bathing position Bathing activity did not occur:  (pt had shower on day shift) Position: Shower  Bathing parts Body parts bathed by patient: Left arm, Chest, Abdomen, Front perineal area, Buttocks, Right upper leg, Left upper leg, Right lower leg, Left lower leg Body parts bathed by helper: Right arm  Bathing assist Assist Level: Touching or steadying assistance(Pt > 75%) Assistive Device Comment: long-handled sponge    Upper Body Dressing/Undressing Upper body dressing   What is the patient wearing?: Pull over shirt/dress, Bra Bra - Perfomed by patient: Thread/unthread right bra strap, Thread/unthread left bra strap Bra - Perfomed by helper: Hook/unhook bra (pull down sports bra) Pull over shirt/dress - Perfomed by patient: Thread/unthread right sleeve, Thread/unthread left sleeve, Put head through opening Pull over shirt/dress - Perfomed by helper: Pull shirt over trunk        Upper body assist Assist Level: Touching or steadying assistance(Pt > 75%)      Lower Body Dressing/Undressing Lower body dressing   What is the patient wearing?: Pants, Shoes Underwear - Performed by patient: Thread/unthread right underwear leg, Thread/unthread left underwear leg Underwear - Performed by helper: Pull underwear up/down Pants- Performed by patient: Thread/unthread right pants leg, Thread/unthread left pants leg Pants- Performed by helper: Pull pants up/down Non-skid slipper socks- Performed by patient: Don/doff right sock, Don/doff left sock Non-skid slipper socks- Performed by helper: Don/doff left sock, Don/doff right sock       Shoes - Performed by  helper: Don/doff right shoe, Don/doff left shoe, Fasten right, Fasten left          Lower body assist Assist for lower body dressing: Touching or steadying assistance (Pt > 75%)      Toileting Toileting   Toileting steps completed by patient: Adjust clothing  prior to toileting, Performs perineal hygiene, Adjust clothing after toileting Toileting steps completed by helper: Adjust clothing prior to toileting, Adjust clothing after toileting Toileting Assistive Devices: Grab bar or rail  Toileting assist Assist level: Touching or steadying assistance (Pt.75%)   Transfers Chair/bed transfer   Chair/bed transfer method: Stand pivot Chair/bed transfer assist level: Touching or steadying assistance (Pt > 75%) Chair/bed transfer assistive device: Armrests, Medical sales representative     Max distance: 100 ft Assist level: Touching or steadying assistance (Pt > 75%)   Wheelchair   Type: Manual Max wheelchair distance: 130 ft Assist Level: Supervision or verbal cues  Cognition Comprehension Comprehension assist level: Follows basic conversation/direction with no assist  Expression Expression assist level: Expresses complex ideas: With extra time/assistive device  Social Interaction Social Interaction assist level: Interacts appropriately with others - No medications needed.  Problem Solving Problem solving assist level: Solves basic 90% of the time/requires cueing < 10% of the time  Memory Memory assist level: Recognizes or recalls 90% of the time/requires cueing < 10% of the time     Medical Problem List and Plan:  1. 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  Cont CIRPT, OT, speech therapy  Bracing ordered  Fluoxetine started 1/12 2. DVT Prophylaxis/Anticoagulation: Pharmaceutical: Lovenox  3. Back pain/RLE neuropathy/Pain Management: improved control  Neurontin increased to 300 TID on 1/14  Robaxin PRN started 1/12  CT reviewed from 2015 showing L4-5 post-surgical changes, L3-4 stable impingement.   L-spine Xrays 1/15 reviewed, showing stable post-surgical changes L4-5  Lidoderm patches x2 started 1/16 with benefit  -replace Kpad 4. Mood: team to provide ego support. LCSW to follow for  evaluation and support.  5. Neuropsych: This patient is capable of making decisions on her own behalf.  6. Skin/Wound Care: routine pressure relief measures.  7. Fluids/Electrolytes/Nutrition: Monitor I/O.   Dysphag 3 thin, advance as tolerated.  BMP WNL 1/18, no clinical signs of dehydration 8. [id number] BP bid  Controlled at present 9. Urinary retention: Improving  UA neg, Ucx NG.   Scheduled toileting--monitor voiding with PVR neg.  10. Chronic constipation: Uses suppository every other day. Added senna S at bedtime with suppository daily as needed.  11. Hypokalemia : Likely dilutional-- supplemented 1/11.   WNL on 1/18 12. Dyslipidemia: On zocor  13. Tobacco abuse: Nicotine patch. Encourage cessation.  14. H/o Gout: No further evidence of FLAIR  Colchicine started 1/17  Improving 15. Right knee pain  Voltaren gel ordered 1/17  Cont Robaxin PRN  Improved     LOS (Days) 10 A FACE TO FACE EVALUATION WAS PERFORMED  SWARTZ,ZACHARY T 12/27/2016 9:04 AM

## 2016-12-28 ENCOUNTER — Inpatient Hospital Stay (HOSPITAL_COMMUNITY): Payer: Medicare Other | Admitting: Speech Pathology

## 2016-12-28 ENCOUNTER — Inpatient Hospital Stay (HOSPITAL_COMMUNITY): Payer: Medicare Other | Admitting: Occupational Therapy

## 2016-12-28 ENCOUNTER — Inpatient Hospital Stay (HOSPITAL_COMMUNITY): Payer: Medicare Other | Admitting: Physical Therapy

## 2016-12-28 ENCOUNTER — Inpatient Hospital Stay (HOSPITAL_COMMUNITY): Payer: Self-pay | Admitting: Occupational Therapy

## 2016-12-28 ENCOUNTER — Inpatient Hospital Stay (HOSPITAL_COMMUNITY): Payer: Self-pay | Admitting: Physical Therapy

## 2016-12-28 DIAGNOSIS — K5909 Other constipation: Secondary | ICD-10-CM

## 2016-12-28 MED ORDER — FLEET ENEMA 7-19 GM/118ML RE ENEM
1.0000 | ENEMA | Freq: Once | RECTAL | Status: AC
  Administered 2016-12-28: 1 via RECTAL
  Filled 2016-12-28: qty 1

## 2016-12-28 MED ORDER — POLYETHYLENE GLYCOL 3350 17 G PO PACK
17.0000 g | PACK | Freq: Two times a day (BID) | ORAL | Status: DC
  Administered 2016-12-28 – 2016-12-31 (×7): 17 g via ORAL
  Filled 2016-12-28 (×7): qty 1

## 2016-12-28 MED ORDER — SENNOSIDES-DOCUSATE SODIUM 8.6-50 MG PO TABS
2.0000 | ORAL_TABLET | Freq: Two times a day (BID) | ORAL | Status: DC
  Administered 2016-12-28 – 2016-12-31 (×6): 2 via ORAL
  Filled 2016-12-28 (×7): qty 2

## 2016-12-28 MED ORDER — BISACODYL 10 MG RE SUPP
10.0000 mg | Freq: Every day | RECTAL | Status: DC
  Administered 2016-12-29 – 2016-12-30 (×2): 10 mg via RECTAL
  Filled 2016-12-28 (×3): qty 1

## 2016-12-28 NOTE — Progress Notes (Signed)
Pt complaining of abd pain, noted no bowel movement in two days with hx of constipation. Fleets administered per order without difficulty. Immediate return of liquid with small amount of hard formed stool. No results of supp earlier. Reported hx of constipation and has not been taking regular meds. Margarito Liner

## 2016-12-28 NOTE — Progress Notes (Signed)
Occupational Therapy Session Note  Patient Details  Name: Ellen Henry MRN: [id number] Date of Birth: 06/09/1946  Today's Date: 12/28/2016 OT Individual Time: 1400-1430 OT Individual Time Calculation (min): 30 min    Short Term Goals: Week 2:  OT Short Term Goal 1 (Week 2): Pt will use L UE for bathing tasks with contact gaurd A OT Short Term Goal 2 (Week 2): Pt will pull pants up using L UE with supervision OT Short Term Goal 3 (Week 2): Pt will don socks with Min A and ADL AE as needed  Skilled Therapeutic Interventions/Progress Updates:     Upon entering the room, pt seated in wheelchair awaiting therapist with no c/o pain. Pt reports feeling constipated and requesting to ambulate to assist with these issues. Pt ambulated with RW and supervision 100' to day room. Pt taking seated rest break . OT demonstrating L UE finger isolation exercises. Pt returning demonstration with min verbal cues for proper technique. Pt having greatest difficulty isolating 4th digit during exercises. Pt ambulated back to room in same manner and returned to wheelchair. Call bell and all needed items within reach upon exiting the room.   Therapy Documentation Precautions:  Precautions Precautions: Fall Precaution Comments: L inattention, L sided weakness Restrictions Weight Bearing Restrictions: No General:   Vital Signs: Therapy Vitals Temp: 98.6 F (37 C) Temp Source: Oral Pulse Rate: 94 Resp: 18 BP: 121/76 Patient Position (if appropriate): Sitting Oxygen Therapy SpO2: 98 % O2 Device: Not Delivered Pain:   ADL: ADL ADL Comments: please see functional navigator Exercises:   Other Treatments:    See Function Navigator for Current Functional Status.   Therapy/Group: Individual Therapy  Gypsy Decant 12/28/2016, 4:31 PM

## 2016-12-28 NOTE — Progress Notes (Signed)
[place] PHYSICAL MEDICINE & REHABILITATION     PROGRESS NOTE  Subjective/Complaints:  Pt seen sitting up in her chair washing her face this AM.  She slept well overnight.  She had intermittent headaches, but they are resolved with tylenol. She requests and enema for constipation.   ROS: +Constipation.  Denies nausea, vomiting, diarrhea, shortness of breath or chest pain   Objective: Vital Signs: Blood pressure 138/68, pulse 78, temperature 98 F (36.7 C), temperature source Oral, resp. rate 18, height 5' (1.524 m), weight 99.1 kg (218 lb 8 oz), SpO2 99 %. No results found. No results for input(s): WBC, HGB, HCT, PLT in the last 72 hours. No results for input(s): NA, K, CL, GLUCOSE, BUN, CREATININE, CALCIUM in the last 72 hours.  Invalid input(s): CO CBG (last 3)  No results for input(s): GLUCAP in the last 72 hours.  Wt Readings from Last 3 Encounters:  12/23/16 99.1 kg (218 lb 8 oz)  12/15/16 104.3 kg (230 lb)  03/10/15 108.9 kg (240 lb)    Physical Exam:  BP 138/68 (BP Location: Left Arm)   Pulse 78   Temp 98 F (36.7 C) (Oral)   Resp 18   Ht 5' (1.524 m)   Wt 99.1 kg (218 lb 8 oz)   SpO2 99%   BMI 42.67 kg/m  Constitutional: She appears well-developed and well-nourished.  HENT: Normocephalic and atraumatic.  Eyes: EOMI. No discharge.  Cardiovascular: RRR. No JVD. Respiratory: clear bilaterally. Unlabored.  GI: Soft. Bowel sounds are normal.  Musculoskeletal: She exhibits no edema or tenderness.  Neurological: She is alert and oriented.  Left facial droop with mild dysarthria.  Motor: LUE: deltoid 3-3+/5 proximal to distal. LLE: 4-/5 HF, 4/5 KE, ADF/DP 4+/5  Skin: Skin is warm and dry. Intact.   Assessment/Plan: 1. Functional deficits secondary to right post limb internal capsule infarct which require 3+ hours per day of interdisciplinary therapy in a comprehensive inpatient rehab setting. Physiatrist is providing close team supervision and 24 hour management  of active medical problems listed below. Physiatrist and rehab team continue to assess barriers to discharge/monitor patient progress toward functional and medical goals.  Function:  Bathing Bathing position Bathing activity did not occur:  (pt had shower on day shift) Position: Shower  Bathing parts Body parts bathed by patient: Left arm, Chest, Abdomen, Front perineal area, Buttocks, Right upper leg, Left upper leg, Right lower leg, Left lower leg Body parts bathed by helper: Right arm  Bathing assist Assist Level: Touching or steadying assistance(Pt > 75%) Assistive Device Comment: long-handled sponge    Upper Body Dressing/Undressing Upper body dressing   What is the patient wearing?: Pull over shirt/dress, Bra Bra - Perfomed by patient: Thread/unthread right bra strap, Thread/unthread left bra strap Bra - Perfomed by helper: Hook/unhook bra (pull down sports bra) Pull over shirt/dress - Perfomed by patient: Thread/unthread right sleeve, Thread/unthread left sleeve, Put head through opening Pull over shirt/dress - Perfomed by helper: Pull shirt over trunk        Upper body assist Assist Level: Touching or steadying assistance(Pt > 75%)      Lower Body Dressing/Undressing Lower body dressing   What is the patient wearing?: Pants, Shoes Underwear - Performed by patient: Thread/unthread right underwear leg, Thread/unthread left underwear leg Underwear - Performed by helper: Pull underwear up/down Pants- Performed by patient: Thread/unthread right pants leg, Thread/unthread left pants leg, Pull pants up/down Pants- Performed by helper: Pull pants up/down Non-skid slipper socks- Performed by patient: Don/doff  right sock, Don/doff left sock Non-skid slipper socks- Performed by helper: Don/doff left sock, Don/doff right sock       Shoes - Performed by helper: Don/doff right shoe, Don/doff left shoe, Fasten right, Fasten left          Lower body assist Assist for lower body  dressing: Supervision or verbal cues      Toileting Toileting   Toileting steps completed by patient: Adjust clothing prior to toileting, Performs perineal hygiene, Adjust clothing after toileting Toileting steps completed by helper: Adjust clothing prior to toileting, Adjust clothing after toileting Toileting Assistive Devices: Grab bar or rail  Toileting assist Assist level: Touching or steadying assistance (Pt.75%)   Transfers Chair/bed transfer   Chair/bed transfer method: Stand pivot Chair/bed transfer assist level: Supervision or verbal cues Chair/bed transfer assistive device: Armrests, Medical sales representative     Max distance: 95 ft Assist level: Supervision or verbal cues   Wheelchair   Type: Manual Max wheelchair distance: 100 Assist Level: Supervision or verbal cues  Cognition Comprehension Comprehension assist level: Follows basic conversation/direction with no assist  Expression Expression assist level: Expresses complex ideas: With extra time/assistive device  Social Interaction Social Interaction assist level: Interacts appropriately with others - No medications needed.  Problem Solving Problem solving assist level: Solves basic problems with no assist  Memory Memory assist level: Recognizes or recalls 90% of the time/requires cueing < 10% of the time     Medical Problem List and Plan:  1. 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  Cont CIR  Bracing ordered  Fluoxetine started 1/12 2. DVT Prophylaxis/Anticoagulation: Pharmaceutical: Lovenox  3. Back pain/RLE neuropathy/Pain Management: improved control  Neurontin increased to 300 TID on 1/14  Robaxin PRN started 1/12  CT reviewed from 2015 showing L4-5 post-surgical changes, L3-4 stable impingement.   L-spine Xrays 1/15 reviewed, showing stable post-surgical changes L4-5  Lidoderm patches x2 started 1/16 with benefit  Kpad 4. Mood: team to provide ego  support. LCSW to follow for evaluation and support.  5. Neuropsych: This patient is capable of making decisions on her own behalf.  6. Skin/Wound Care: routine pressure relief measures.  7. Fluids/Electrolytes/Nutrition: Monitor I/O.   Dysphag 3 thin, advance as tolerated.  BMP WNL 1/18, no clinical signs of dehydration 8. [id number] BP bid  Controlled at present 9. Urinary retention: Improving  UA neg, Ucx NG.   Scheduled toileting--monitor voiding with PVR neg.  10. Chronic constipation:   Uses suppository every other day PTA.   Senna S increased to BID  Miralax increased to BID  Enema x1  Suppository daily as needed.  11. Hypokalemia : Likely dilutional-- supplemented 1/11.   WNL on 1/18 12. Dyslipidemia: On zocor  13. Tobacco abuse: Nicotine patch. Encourage cessation.  14. H/o Gout:   Colchicine started 1/17  Improving 15. Right knee pain  Voltaren gel ordered 1/17  Cont Robaxin PRN  Improved     LOS (Days) 11 A FACE TO FACE EVALUATION WAS PERFORMED  Ankit Lorie Phenix 12/28/2016 9:19 AM

## 2016-12-28 NOTE — Progress Notes (Signed)
Speech Language Pathology Discharge Summary & Final Treatment Note  Patient Details  Name: Ellen Henry MRN: [id number] Date of Birth: [birth year]  Today's Date: 12/28/2016 SLP Individual Time: 1030-1100 SLP Individual Time Calculation (min): 30 min  Skilled Therapeutic Interventions:  Skilled treatment session focused on addressing cognition goals and education.  Patient and daughter were present for today's session.  Patient recalled a previously taught, mildly complex problem solving task with extra time.  Patient also required increased wait time to identify best solution during her turn.  Daughter reports that she and patient have been completing financial management tasks together, upon further questioning patient is independent for the recall and thinking and daughter has been doing the driving and delivering for her.  Patient and daughter confirm that patient has returned to baseline level of functioning with her swallowing, speech-language, and cognition.  As a result, education was completed and SLP will sign off at this time.  Continue with current plan of care.   Patient has met 4 of 4 long term goals.  Patient to discharge at overall Independent;Modified Independent level.  Reasons goals not met: n/a   Clinical Impression/Discharge Summary:    Patient has made functional gains during this rehab admission and has met 4 out of 4 long term goals due to improved functional abilities.  Patient is currently Independent with swallow function and an overall Mod I for extra timet to complete cognitive tasks.  Patient and family education has been completed and patient will discharge home at baseline level of functioning with no assist needed.   Care Partner:  Caregiver Able to Provide Assistance: Other (comment) (no assist needed)  Type of Caregiver Assistance:  (none)  Recommendation: None     Equipment: None   Reasons for discharge: Treatment goals met   Patient/Family Agrees with  Progress Made and Goals Achieved: Yes   Function:  Eating Eating   Modified Consistency Diet: No Eating Assist Level: More than reasonable amount of time           Cognition Comprehension Comprehension assist level: Follows complex conversation/direction with extra time/assistive device  Expression   Expression assist level: Expresses complex ideas: With no assist  Social Interaction Social Interaction assist level: Interacts appropriately with others - No medications needed.  Problem Solving Problem solving assist level: Solves complex problems: With extra time  Memory Memory assist level: Requires cues to use assistive device   Carmelia Roller., CCC-SLP [phone]  [place] 12/28/2016, 12:37 PM

## 2016-12-28 NOTE — Progress Notes (Signed)
Occupational Therapy Session Note  Patient Details  Name: Ellen Henry MRN: [id number] Date of Birth: 1946/09/19  Today's Date: 12/28/2016 OT Individual Time: 6720-9470 OT Individual Time Calculation (min): 57 min    Short Term Goals: Week 1:  OT Short Term Goal 1 (Week 1): Pt will maintain static standing balance for 1 minutes with min A at RW/ sink in prep for functional task OT Short Term Goal 1 - Progress (Week 1): Met OT Short Term Goal 2 (Week 1): Pt will recall hemi-dressing techniques 2 consecutive days with min questioning cues OT Short Term Goal 2 - Progress (Week 1): Met OT Short Term Goal 3 (Week 1): Pt will complete sit<>stand with overall Min A un preparation for LB dressing tasks OT Short Term Goal 3 - Progress (Week 1): Met Week 2:  OT Short Term Goal 1 (Week 2): Pt will use L UE for bathing tasks with contact gaurd A OT Short Term Goal 2 (Week 2): Pt will pull pants up using L UE with supervision OT Short Term Goal 3 (Week 2): Pt will don socks with Min A and ADL AE as needed     Skilled Therapeutic Interventions/Progress Updates: Pt participated in skilled OT session focusing on ADL retraining, AE training, dynamic standing balance and L NMR. Pt was in bathroom with nursing present at time of arrival, agreeable to tx. She completed toileting tasks with supervision, then ambulated to shower bench with min guard for transfer. Bathing completed with overall steady assist for pericare. Pt able to use L UE functionally during tasks, including bathing right arm. Pt utilized LH sponge for L LE. Afterwards she donned hospital gown and gripper socks, and ambulated into room with min guard and RW, to gather clothing items with L UE. Dressing completed w/c level standing as needed with RW. She was able to complete with overall min guard with use of sock aide. Pt did not use reacher this session. At end of session she was taken to family room to prepare coffee in standing using L UE for  opening containers and using controls on coffee maker. Supervision sit<stand. Pt educated on handling hot beverages/kitchen items with R UE for safety. Afterwards she was taken back to room and set up for lunch. All needs within reach at time of departure.      Therapy Documentation Precautions:  Precautions Precautions: Fall Precaution Comments: L inattention, L sided weakness Restrictions Weight Bearing Restrictions: No   Pain: No c/o pain during session    ADL: ADL ADL Comments: please see functional navigator    See Function Navigator for Current Functional Status.   Therapy/Group: Individual Therapy  Michaela A Hoffman 12/28/2016, 12:29 PM

## 2016-12-28 NOTE — Progress Notes (Signed)
Pt asking for mag citrate for relief of gas and constipation. PAm Love PA made aware of results of supp/fleets and request for additional medication. Compazine given earlier for nausea. Margarito Liner

## 2016-12-28 NOTE — Progress Notes (Addendum)
Physical Therapy Session Note  Patient Details  Name: Ellen Henry MRN: [id number] Date of Birth: November 17, 1946  Today's Date: 12/28/2016 PT Individual Time: [id number] PT Individual Time Calculation (min): 28 min   Short Term Goals: Week 2:  PT Short Term Goal 1 (Week 2): Patient will perform bed mobility on regular bed with supervision. PT Short Term Goal 2 (Week 2): Patient will perform transfers bed <> wheelchair with supervision and min verbal cues for technique.  PT Short Term Goal 3 (Week 2): Patient will ambulate 50 ft using LRAD with supervision. PT Short Term Goal 4 (Week 2): Patient will negotiate up/down 4 steps with 1 rail and mod A.   Skilled Therapeutic Interventions/Progress Updates:  Pt received in w/c & agreeable to tx. Pt noted constipation but otherwise no c/o pain. Session focused on endurance training, gait training & stair negotiation. Pt ambulated room<>gym with RW, L hand orthosis and supervision requiring intermittent cuing for L foot clearance. Pt ambulates at a significantly decreased gait speed and gait pattern becomes more inconsistent (narrow base of support) with fatigue. Pt negotiated 4 steps (6") with single rail on R and BUE support; pt negotiated stairs laterally with steady assist. Pt negotiated stairs in manner that simulates home environment. At end of session pt left sitting in w/c in room with all needs within reach.     Therapy Documentation Precautions:  Precautions Precautions: Fall Precaution Comments: L inattention, L sided weakness Restrictions Weight Bearing Restrictions: No  See Function Navigator for Current Functional Status.   Therapy/Group: Individual Therapy  Waunita Schooner 12/28/2016, 4:43 PM

## 2016-12-28 NOTE — Progress Notes (Signed)
Physical Therapy Session Note  Patient Details  Name: Ellen Henry MRN: [id number] Date of Birth: 11/20/1946  Today's Date: 12/28/2016 PT Individual Time: 1130-1215 PT Individual Time Calculation (min): 45 min   Short Term Goals: Week 2:  PT Short Term Goal 1 (Week 2): Patient will perform bed mobility on regular bed with supervision. PT Short Term Goal 2 (Week 2): Patient will perform transfers bed <> wheelchair with supervision and min verbal cues for technique.  PT Short Term Goal 3 (Week 2): Patient will ambulate 50 ft using LRAD with supervision. PT Short Term Goal 4 (Week 2): Patient will negotiate up/down 4 steps with 1 rail and mod A.   Skilled Therapeutic Interventions/Progress Updates:    Pt very motivated to ambulate today, ambulating from room to gym and back at end of session. Cues for posture and relaxation through upper trap region. Noted increased difficulty with Lt foot clearance by end of session. In gym pt able to ambulate in parallel bars with Rt UE support only. Progression made to ambulate 10 ft with HHA. Decreased stability with single extremity support but no loss of balance. Working on standing balance in gym with variable foot positions including staggered, and narrowing BOS (with/without perturbations). Pt in room in w/c with access to call light following session.   Therapy Documentation Precautions:  Precautions Precautions: Fall Precaution Comments: L inattention, L sided weakness Restrictions Weight Bearing Restrictions: No  Pain: Reports having abdominal discomfort from constipation. Monitored during session, pt not rating as pain at this time.   See Function Navigator for Current Functional Status.   Therapy/Group: Individual Therapy  Linard Millers, PT, CSCS 12/28/2016, 3:57 PM

## 2016-12-28 NOTE — Progress Notes (Signed)
Pt c/o abd pain, needs relief, order obtained for SSE. Administered warm water/SSE with lg amount of brown colored fluid return and small amount of hard formed stool. PT reported good relief of abd discomfort and assisted back to chair. Maalox given for relief of gas pains and nausea. Margarito Liner

## 2016-12-29 ENCOUNTER — Inpatient Hospital Stay (HOSPITAL_COMMUNITY): Payer: Medicare Other | Admitting: Physical Therapy

## 2016-12-29 ENCOUNTER — Inpatient Hospital Stay (HOSPITAL_COMMUNITY): Payer: Self-pay | Admitting: Occupational Therapy

## 2016-12-29 ENCOUNTER — Inpatient Hospital Stay (HOSPITAL_COMMUNITY): Payer: Self-pay | Admitting: Physical Therapy

## 2016-12-29 MED ORDER — BISACODYL 10 MG RE SUPP
10.0000 mg | Freq: Once | RECTAL | Status: DC
  Filled 2016-12-29: qty 1

## 2016-12-29 MED ORDER — MAGNESIUM HYDROXIDE 400 MG/5ML PO SUSP
30.0000 mL | Freq: Once | ORAL | Status: AC
  Administered 2016-12-29: 30 mL via ORAL
  Filled 2016-12-29: qty 30

## 2016-12-29 NOTE — Progress Notes (Signed)
Physical Therapy Session Note  Patient Details  Name: Ellen Henry MRN: [id number] Date of Birth: 1945-12-17  Today's Date: 12/29/2016 PT Individual Time: 8309-4076 PT Individual Time Calculation (min): 35 min   Short Term Goals: Week 2:  PT Short Term Goal 1 (Week 2): Patient will perform bed mobility on regular bed with supervision. PT Short Term Goal 2 (Week 2): Patient will perform transfers bed <> wheelchair with supervision and min verbal cues for technique.  PT Short Term Goal 3 (Week 2): Patient will ambulate 50 ft using LRAD with supervision. PT Short Term Goal 4 (Week 2): Patient will negotiate up/down 4 steps with 1 rail and mod A.      Therapy Documentation Precautions:  Precautions Precautions: Fall Precaution Comments: L inattention, L sided weakness Restrictions Weight Bearing Restrictions: No  Sit to and from stand transfer close supervision with use of RW verbal cues for controlled descent   Patient ambulated 150 feet and 175 feet with RW close supervision. Patient ambulated with a step through gait pattern. Verbal cues for upright posture and increased step length on the left. Verbal cues for left toe clearance.    Patient up and down 12 steps with bilateral handrails supervision. Patient ascended stairs with LLE in order to improve strengthening and muscle activation and left foot clearance.  Standing there ex: B heel raises with RW 30x    Patient returned to room at end of session with all needs met.   See Function Navigator for Current Functional Status.   Therapy/Group: Individual Therapy  Retta Diones 12/29/2016, 2:34 PM

## 2016-12-29 NOTE — Progress Notes (Signed)
[place] PHYSICAL MEDICINE & REHABILITATION     PROGRESS NOTE  Subjective/Complaints:  Pt sitting up at the edge of her bed this AM.  She states she slept well overnight, but woke up with back pain.  She states she still did not have a BM.     ROS: +Constipation.  Denies nausea, vomiting, diarrhea, shortness of breath or chest pain   Objective: Vital Signs: Blood pressure 117/85, pulse 76, temperature 97.7 F (36.5 C), temperature source Oral, resp. rate 18, height 5' (1.524 m), weight 99.1 kg (218 lb 8 oz), SpO2 98 %. No results found. No results for input(s): WBC, HGB, HCT, PLT in the last 72 hours. No results for input(s): NA, K, CL, GLUCOSE, BUN, CREATININE, CALCIUM in the last 72 hours.  Invalid input(s): CO CBG (last 3)  No results for input(s): GLUCAP in the last 72 hours.  Wt Readings from Last 3 Encounters:  12/23/16 99.1 kg (218 lb 8 oz)  12/15/16 104.3 kg (230 lb)  03/10/15 108.9 kg (240 lb)    Physical Exam:  BP 117/85 (BP Location: Right Arm)   Pulse 76   Temp 97.7 F (36.5 C) (Oral)   Resp 18   Ht 5' (1.524 m)   Wt 99.1 kg (218 lb 8 oz)   SpO2 98%   BMI 42.67 kg/m  Constitutional: She appears well-developed and well-nourished.  HENT: Normocephalic and atraumatic.  Eyes: EOMI. No discharge.  Cardiovascular: RRR. No JVD. Respiratory: clear bilaterally. Unlabored.  GI: Soft. Bowel sounds are normal.  Musculoskeletal: She exhibits no edema or tenderness.  Neurological: She is alert and oriented.  Left facial droop with mild dysarthria.  Motor: LUE: deltoid 3-3+/5 proximal to distal (stable) LLE: 4-/5 HF, 4/5 KE, ADF/DP 4+/5  Skin: Skin is warm and dry. Intact.   Assessment/Plan: 1. Functional deficits secondary to right post limb internal capsule infarct which require 3+ hours per day of interdisciplinary therapy in a comprehensive inpatient rehab setting. Physiatrist is providing close team supervision and 24 hour management of active medical problems  listed below. Physiatrist and rehab team continue to assess barriers to discharge/monitor patient progress toward functional and medical goals.  Function:  Bathing Bathing position Bathing activity did not occur:  (pt had shower on day shift) Position: Shower  Bathing parts Body parts bathed by patient: Left arm, Chest, Abdomen, Front perineal area, Buttocks, Right upper leg, Left upper leg, Right lower leg, Left lower leg, Right arm Body parts bathed by helper: Right arm  Bathing assist Assist Level: Touching or steadying assistance(Pt > 75%) Assistive Device Comment: long-handled sponge    Upper Body Dressing/Undressing Upper body dressing   What is the patient wearing?: Pull over shirt/dress, Bra Bra - Perfomed by patient: Thread/unthread right bra strap, Thread/unthread left bra strap Bra - Perfomed by helper: Hook/unhook bra (pull down sports bra) Pull over shirt/dress - Perfomed by patient: Thread/unthread right sleeve, Thread/unthread left sleeve, Put head through opening Pull over shirt/dress - Perfomed by helper: Pull shirt over trunk        Upper body assist Assist Level: Touching or steadying assistance(Pt > 75%)      Lower Body Dressing/Undressing Lower body dressing   What is the patient wearing?: Pants, Underwear, Non-skid slipper socks Underwear - Performed by patient: Thread/unthread right underwear leg, Thread/unthread left underwear leg, Pull underwear up/down Underwear - Performed by helper: Pull underwear up/down Pants- Performed by patient: Thread/unthread right pants leg, Thread/unthread left pants leg, Pull pants up/down Pants- Performed by  helper: Pull pants up/down Non-skid slipper socks- Performed by patient: Don/doff right sock, Don/doff left sock (with sock aide) Non-skid slipper socks- Performed by helper: Don/doff left sock, Don/doff right sock       Shoes - Performed by helper: Don/doff right shoe, Don/doff left shoe, Fasten right, Fasten left           Lower body assist Assist for lower body dressing: Touching or steadying assistance (Pt > 75%)      Toileting Toileting   Toileting steps completed by patient: Performs perineal hygiene, Adjust clothing after toileting Toileting steps completed by helper: Adjust clothing prior to toileting, Adjust clothing after toileting Toileting Assistive Devices: Grab bar or rail  Toileting assist Assist level: Supervision or verbal cues   Transfers Chair/bed transfer   Chair/bed transfer method: Ambulatory Chair/bed transfer assist level: Supervision or verbal cues Chair/bed transfer assistive device: Armrests, Medical sales representative     Max distance: 200 ft Assist level: Supervision or verbal cues   Wheelchair   Type: Manual Max wheelchair distance: 100 Assist Level: Supervision or verbal cues  Cognition Comprehension Comprehension assist level: Follows complex conversation/direction with extra time/assistive device  Expression Expression assist level: Expresses complex ideas: With no assist  Social Interaction Social Interaction assist level: Interacts appropriately with others - No medications needed.  Problem Solving Problem solving assist level: Solves complex problems: With extra time  Memory Memory assist level: Requires cues to use assistive device     Medical Problem List and Plan:  1. 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  Cont CIR  Bracing ordered  Fluoxetine started 1/12 2. DVT Prophylaxis/Anticoagulation: Pharmaceutical: Lovenox  3. Back pain/RLE neuropathy/Pain Management: improved control  Neurontin increased to 300 TID on 1/14  Robaxin PRN started 1/12  CT reviewed from 2015 showing L4-5 post-surgical changes, L3-4 stable impingement.   L-spine Xrays 1/15 reviewed, showing stable post-surgical changes L4-5  Lidoderm patches x2 started 1/16 with benefit  Kpad 4. Mood: team to provide ego support. LCSW to  follow for evaluation and support.  5. Neuropsych: This patient is capable of making decisions on her own behalf.  6. Skin/Wound Care: routine pressure relief measures.  7. Fluids/Electrolytes/Nutrition: Monitor I/O.   Dysphag 3 thin, advance as tolerated.  BMP WNL 1/18, no clinical signs of dehydration 8. [id number] BP bid  Controlled 1/23 9. Urinary retention: Improving  UA neg, Ucx NG.   Scheduled toileting--monitor voiding with PVR neg.  10. Chronic constipation:   Uses suppository every other day PTA.   Senna S increased to BID  Miralax increased to BID  Enema x1 on 1/22, Milk of Mag/Dulcolax x1 on 1/23  Suppository daily as needed.  11. Hypokalemia : Likely dilutional-- supplemented 1/11.   WNL on 1/18 12. Dyslipidemia: On zocor  13. Tobacco abuse: Nicotine patch. Encourage cessation.  14. H/o Gout:   Colchicine started 1/17  Slight exacerbation after eating tuna last night 15. Right knee pain  Voltaren gel ordered 1/17  Cont Robaxin PRN  Improved     LOS (Days) 12 A FACE TO FACE EVALUATION WAS PERFORMED  Dinah Lupa Lorie Phenix 12/29/2016 8:41 AM

## 2016-12-29 NOTE — Progress Notes (Signed)
Physical Therapy Session Note  Patient Details  Name: Ellen Henry MRN: [id number] Date of Birth: November 07, 1946  Today's Date: 12/29/2016 PT Individual Time: 0900-1000 PT Individual Time Calculation (min): 60 min   Short Term Goals: Week 2:  PT Short Term Goal 1 (Week 2): Patient will perform bed mobility on regular bed with supervision. PT Short Term Goal 2 (Week 2): Patient will perform transfers bed <> wheelchair with supervision and min verbal cues for technique.  PT Short Term Goal 3 (Week 2): Patient will ambulate 50 ft using LRAD with supervision. PT Short Term Goal 4 (Week 2): Patient will negotiate up/down 4 steps with 1 rail and mod A.   Skilled Therapeutic Interventions/Progress Updates: Pt presented in w/c with family present agreeable to therapy. Donned pants in w/c with minA for time management. Pt ambulated to rehab gym with min cues for posture.  Pt requiring x1 brief standing break with quick recovery. Ascend/decend x 4 steps with R rail only with step to pattern min guard. Mod cues for foot placement to allow for bilateral foot clearance on step.  Noted heavy use of UE with ascending steps. Decreased eccentric control when descending stairs and difficulty lifting LLE for step clearance. Instructed and performed seated hip flexion x 10, and Level 1 resistance band LAQ and HS pulls x 10 bilaterally. Pt demonstrating good safety with activity. Performed balance activities on compliant surface with minA.  Tactile cues for correction with good carryover. Performed static standing on non-compliant with use of pipe tree for forced use of LUE.  Pt ambulated returning to room and left in w/c with call bell within reach and all current needs met.      Therapy Documentation Precautions:  Precautions Precautions: Fall Precaution Comments: L inattention, L sided weakness Restrictions Weight Bearing Restrictions: No   See Function Navigator for Current Functional  Status.   Therapy/Group: Individual Therapy  Rosita DeChalus  Rosita DeChalus, PTA  12/29/2016, 4:54 PM

## 2016-12-29 NOTE — Progress Notes (Signed)
Physical Therapy Session Note  Patient Details  Name: Ellen Henry MRN: [id number] Date of Birth: [birth year]  Today's Date: 12/29/2016 PT Individual Time: 1100-1200 PT Individual Time Calculation (min): 60 min   Short Term Goals: Week 2:  PT Short Term Goal 1 (Week 2): Patient will perform bed mobility on regular bed with supervision. PT Short Term Goal 2 (Week 2): Patient will perform transfers bed <> wheelchair with supervision and min verbal cues for technique.  PT Short Term Goal 3 (Week 2): Patient will ambulate 50 ft using LRAD with supervision. PT Short Term Goal 4 (Week 2): Patient will negotiate up/down 4 steps with 1 rail and mod A.   Skilled Therapeutic Interventions/Progress Updates:  Pt was found in W/C with pants and shoes on. Pt complained of no pain and was willing to participate in therapy.  Pt ambulated 153 ft, 200 ft, and 200 ft with RW and supervision with reciprocal gait pattern. 2nd and 3rd ambulation episodes were performed with a foot up brace that helped pt clear foot for improved swing through pattern with LLE  Car transfer was supervision with max cues for handplacement and sequence.   Pt ambulated twice through 60 ft obstacle course consisting of 3 inch high ramp, maneuvering around 5 traffic cones, step up and off on 4 inch platform, maneuver over 4x4 ft foam mat, and step over 2 hiking poles. Pt required close supervision and max verbal cues that reduced down to min verbal cues with sequence and proper DME use with foam mat and platform. Pt was wearing foot up brace with obstacle course.   Pt required supervision for all sit-to-stand tranfers throughout therapy with occasional verbal cues for lifting LUE off handsplint when sitting. Pt demonstrated supervision with chairs with and without armrests.  Pt was left in room in W/C with all needs within reach to eat lunch. Pt was independent with donning non-slip socks after therapy. Shoes and foot up brace were  removed dependently for time management.   Therapy Documentation Precautions:  Precautions Precautions: Fall Precaution Comments: L inattention, L sided weakness Restrictions Weight Bearing Restrictions: No  Pain: Pain Assessment Pain Score: 0-No pain   See Function Navigator for Current Functional Status.   Therapy/Group: Individual Therapy  Rosendo Gros 12/29/2016, 11:23 AM

## 2016-12-29 NOTE — Evaluation (Signed)
Recreational Therapy Assessment and Plan  Patient Details  Name: Ellen Henry MRN: [id number] Date of Birth: 04/10/1946 Today's Date: 12/29/2016  LATE ENTRY from  12/23/16 Rehab Potential: Good ELOS: 2 weeks   Assessment Clinical Impression: Problem List:      Patient Active Problem List   Diagnosis Date Noted  . Benign essential HTN   . Slow transit constipation   . Chronic bilateral low back pain without sciatica   . Dysphagia, post-stroke   . Stroke (cerebrum) ([place]) 12/17/2016  . Left hemiparesis ([place])   . Generalized weakness   . Acute ischemic stroke ([place]) 12/15/2016  . Urinary retention 02/25/2015  . Primary osteoarthritis of left knee 02/15/2015  . Morbid obesity ([place]) 02/15/2015  . Essential hypertension, benign 09/07/2014  . Constipation 09/07/2014  . Hyperlipidemia 09/05/2014  . Gout 09/05/2014  . Acute blood loss anemia 09/05/2014  . Neuropathy ([place]) 09/05/2014  . UTI (urinary tract infection) 08/31/2014  . Radiculopathy 08/29/2014    Past Medical History:      Past Medical History:  Diagnosis Date  . Arthritis   . Dry skin   . GERD (gastroesophageal reflux disease)    "years ago", diet controlled  . Gout   . H/O pyelonephritis    as a child  . 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 Hypertension   . Neuropathy ([place])   . Pneumonia   . Radiculopathy   . Sleep apnea    does not use cpap   Past Surgical History:       Past Surgical History:  Procedure Laterality Date  . ABDOMINAL EXPOSURE N/A 08/29/2014   Procedure: ABDOMINAL EXPOSURE;  Surgeon: Rosetta Posner, MD;  Location: [facility];  Service: Vascular;  Laterality: N/A;  . ABDOMINAL HYSTERECTOMY    . ANTERIOR LUMBAR FUSION N/A 08/29/2014   Procedure: ANTERIOR LUMBAR FUSION 1 LEVEL;  Surgeon: Sinclair Ship, MD;  Location: [facility];  Service: Orthopedics;  Laterality: N/A;  Lumbar 4-5 anterior lumbar interbody fusion with allograft and instrumentation.   . APPENDECTOMY    . BACK SURGERY  08/28/2014 and 08/29/2014   lumbar fusion  . CARPAL TUNNEL RELEASE Right    release done twice  . COLONOSCOPY    . JOINT REPLACEMENT    . REPLACEMENT TOTAL KNEE  2010   rigth knee  . TONSILLECTOMY    . TOTAL KNEE ARTHROPLASTY Left 02/15/2015   Procedure: TOTAL KNEE ARTHROPLASTY;  Surgeon: [name], MD;  Location: [facility];  Service: Orthopedics;  Laterality: Left;    Assessment & Plan Clinical Impression: Patient is a 71 y.o. year old female with recent admission to the hospital on 12/14/2016 with history of HTN, chronic back pain with radiculopathy, OSA who was admitted on 12/14/16 with complaints of diffuse weakness. She was found to have left facial weakness on evaluation by EMS and symptoms resolved in ED therefore tPA not administered. CT head negative. She developed left sided weakness later that evening and CTA head/neck without perfusion abnormality or occlusion/high grade stenosis. Therefore not endovascular candidate. 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 She was started on ASA for secondary stroke prevention and Work up initiated. Therapy evaluations done and limited by pain as well as decreased balance at EOB. Swallow evaluation done and diet advanced to dysphagia 2, thin liquids today. She has had difficulty voiding with PVR >700cc this am. CIR recommended for follow up therapy.  Patient transferred to  CIR on 12/17/2016 and referred by team for TR participation.  Pt presents with decreased activity tolerance, decreased functional mobility, decreased balance, left inattention, decreased awareness of midline Limiting pt's independence with leisure/community pursuits.  Leisure History/Participation Premorbid leisure interest/current participation: Sports - Exercise (Comment);Nature - Other (Comment);Community - Building control surveyor - Doctor, hospital - Travel (Comment) Expression  Interests: Music (Comment) Other Leisure Interests: Television;Cooking/Baking Leisure Participation Style: With Family/Friends Awareness of Community Resources: Excellent Psychosocial / Spiritual Social interaction - Mood/Behavior: Cooperative Academic librarian Appropriate for Education?: Yes Recreational Therapy Orientation Orientation -Reviewed with patient: Available activity resources Strengths/Weaknesses Patient Strengths/Abilities: Willingness to participate;Active premorbidly Patient weaknesses: Physical limitations TR Patient demonstrates impairments in the following area(s): Endurance;Motor;Safety  Plan Rec Therapy Plan Is patient appropriate for Therapeutic Recreation?: Yes Rehab Potential: Good Treatment times per week: Min 1 TR session/group >30 minutes during LOS Estimated Length of Stay: 2 weeks TR Treatment/Interventions: Adaptive equipment instruction;1:1 session;Balance/vestibular training;Functional mobility training;Community reintegration;Patient/family education;Therapeutic activities;Recreation/leisure participation;Group participation (Comment);Therapeutic exercise;UE/LE Coordination activities  Recommendations for other services: None   Discharge Criteria: Patient will be discharged from TR if patient refuses treatment 3 consecutive times without medical reason.  If treatment goals not met, if there is a change in medical status, if patient makes no progress towards goals or if patient is discharged from hospital.  The above assessment, treatment plan, treatment alternatives and goals were discussed and mutually agreed upon: by patient  [place] 12/29/2016, 1:03 PM

## 2016-12-29 NOTE — Progress Notes (Addendum)
Occupational Therapy Session Note  Patient Details  Name: Ellen Henry MRN: [id number] Date of Birth: Nov 10, 1946  Today's Date: 12/29/2016 OT Individual Time: 1300-1345 OT Individual Time Calculation (min): 45 min    Short Term Goals: Week 1:  OT Short Term Goal 1 (Week 1): Pt will maintain static standing balance for 1 minutes with min A at RW/ sink in prep for functional task OT Short Term Goal 1 - Progress (Week 1): Met OT Short Term Goal 2 (Week 1): Pt will recall hemi-dressing techniques 2 consecutive days with min questioning cues OT Short Term Goal 2 - Progress (Week 1): Met OT Short Term Goal 3 (Week 1): Pt will complete sit<>stand with overall Min A un preparation for LB dressing tasks OT Short Term Goal 3 - Progress (Week 1): Met  Skilled Therapeutic Interventions/Progress Updates: 1:1 Self care retraining at shower level. Focus on functional ambulation around room with RW with hand splint for left UE, Pt able to bathe at shower level with distant supervision/ setup. Pt exited the bathroom with RW without hand splint with focus on attention to left hand grasp/ grip. Returned to w/c to dress with focus on sit to stand pushing up with only left UE and reaching back with left UE for stand to sit for increased weight bearing opportunity and weight shifting onto left LE. Also focus on standing balance without UE support. Left in w/c at end of session. Grooming at mod I level.  No AE used in session today!     Therapy Documentation Precautions:  Precautions Precautions: Fall Precaution Comments: L inattention, L sided weakness Restrictions Weight Bearing Restrictions: No Pain: Pain Assessment Pain Score: 0-No pain  See Function Navigator for Current Functional Status.   Therapy/Group: Individual Therapy  Willeen Cass [facility] 12/29/2016, 1:20 PM

## 2016-12-30 ENCOUNTER — Inpatient Hospital Stay (HOSPITAL_COMMUNITY): Payer: Medicare Other | Admitting: Physical Therapy

## 2016-12-30 ENCOUNTER — Inpatient Hospital Stay (HOSPITAL_COMMUNITY): Payer: Self-pay | Admitting: Occupational Therapy

## 2016-12-30 MED ORDER — BISACODYL 10 MG RE SUPP: 10.0000 mg | Freq: Once | RECTAL | Status: DC

## 2016-12-30 MED ORDER — SENNOSIDES-DOCUSATE SODIUM 8.6-50 MG PO TABS: 2.0000 | ORAL_TABLET | Freq: Two times a day (BID) | ORAL | 1 refills | Status: AC

## 2016-12-30 MED ORDER — MAGNESIUM HYDROXIDE 400 MG/5ML PO SUSP
30.0000 mL | Freq: Once | ORAL | Status: AC
  Administered 2016-12-30: 30 mL via ORAL
  Filled 2016-12-30 (×3): qty 30

## 2016-12-30 MED ORDER — WITCH HAZEL-GLYCERIN EX PADS
MEDICATED_PAD | CUTANEOUS | Status: DC | PRN
  Filled 2016-12-30: qty 100

## 2016-12-30 NOTE — Progress Notes (Signed)
Physical Therapy Session Note  Patient Details  Name: Ellen Henry MRN: [id number] Date of Birth: 28-Nov-1946  Today's Date: 12/30/2016 PT Individual Time: [id number] PT Individual Time Calculation (min): 73 min   Short Term Goals: Week 2:  PT Short Term Goal 1 (Week 2): Patient will perform bed mobility on regular bed with supervision. PT Short Term Goal 2 (Week 2): Patient will perform transfers bed <> wheelchair with supervision and min verbal cues for technique.  PT Short Term Goal 3 (Week 2): Patient will ambulate 50 ft using LRAD with supervision. PT Short Term Goal 4 (Week 2): Patient will negotiate up/down 4 steps with 1 rail and mod A.   Skilled Therapeutic Interventions/Progress Updates:  Pt was found in room chair with all needs within reach. Pt complained of no pain before and during therapy  All sit-to-stand and ambulatory transfers were supervision with RW .  Pt required occasional verbal cues for handplacement when fatigued.   Pt performed the BERG balance assessment. Pt earned an 31/56 which indicates patient is at high risk of falling. Pt was encouraged to continue using RW with all activities. Details of the test are presented below  Pt ambulated 200 ft, 80 ft, 90 ft, and 250 ft supervision with RW and verbal cues for performing proper heel strike on left foot.   Pt demonstrated supervision with sit-to-lying transfer on standard bed 3 times with min verbal cues for position.   Pt ascended forward and descended facing R handrail 12 six inch steps to mimic home environment.   Pt ambulated back to room (250 ft) and was positioned into W/C with all needs within reach.      Therapy Documentation Precautions:  Precautions Precautions: Fall Precaution Comments: L inattention, L sided weakness Restrictions Weight Bearing Restrictions: No    Balance: Balance Balance Assessed: Yes Standardized Balance Assessment Standardized Balance Assessment: Berg Balance  Test Berg Balance Test Sit to Stand: Able to stand without using hands and stabilize independently Standing Unsupported: Able to stand 2 minutes with supervision Sitting with Back Unsupported but Feet Supported on Floor or Stool: Able to sit 2 minutes under supervision Stand to Sit: Uses backs of legs against chair to control descent Transfers: Able to transfer with verbal cueing and /or supervision Standing Unsupported with Eyes Closed: Able to stand 10 seconds with supervision Standing Ubsupported with Feet Together: Needs help to attain position but able to stand for 30 seconds with feet together From Standing, Reach Forward with Outstretched Arm: Can reach confidently >25 cm (10") From Standing Position, Pick up Object from Floor: Able to pick up shoe, needs supervision From Standing Position, Turn to Look Behind Over each Shoulder: Looks behind from both sides and weight shifts well Turn 360 Degrees: Needs assistance while turning Standing Unsupported, Alternately Place Feet on Step/Stool: Needs assistance to keep from falling or unable to try Standing Unsupported, One Foot in Front: Able to take small step independently and hold 30 seconds Standing on One Leg: Unable to try or needs assist to prevent fall Total Score: 31   See Function Navigator for Current Functional Status.   Therapy/Group: Individual Therapy  Rosendo Gros 12/30/2016, 3:41 PM

## 2016-12-30 NOTE — Progress Notes (Signed)
Social Work Patient ID: Ellen Henry, female   DOB: 1946-08-28, 71 y.o.   MRN: [id number]  Met with pt to inform her her insurance has denied her stay past tomorrow. Have discussed with team in team conference. MD feels she is medically stable and does not feel a peer to peer is needed to have pt stay longer. Plan for tomorrow is have daughter come in at 56;00-9;30 am for family education and then pt to go on a outing. Daughter will come back after lunch to pick up and take pt home. Discussed equipment needs she wants to wait on a tub bench due to cost and is agreeable to a rental wheelchair. Have made referral to [facility] for the wheelchair. Agreeable to follow up therapies. Pt and daughter agreeable to this plan.

## 2016-12-30 NOTE — Patient Care Conference (Signed)
Inpatient RehabilitationTeam Conference and Plan of Care Update Date: 12/30/2016   Time: 2:30 PM    Patient Name: Ellen Henry      Medical Record Number: [id number]  Date of Birth: Sep 23, 1946 Sex: Female         Room/Bed: 4W05C/4W05C-01 Payor Info: Payor: [employer] MEDICARE / Plan: [employer] MEDICARE / Product Type: *No Product type* /    Admitting Diagnosis: R CVA  Admit Date/Time:  12/17/2016  4:51 PM Admission Comments: No comment available   Primary Diagnosis:  <principal problem not specified> Principal Problem: <principal problem not specified>  Patient Active Problem List   Diagnosis Date Noted  . Chronic constipation   . Acute idiopathic gout involving toe of right foot   . Acute pain of right knee   . Failed back syndrome   . Pain   . Chronic pain syndrome   . Labile blood pressure   . Radicular pain   . Benign essential HTN   . Slow transit constipation   . Chronic bilateral low back pain without sciatica   . Dysphagia, post-stroke   . 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 Left hemiparesis ([place])   . Generalized weakness   . Acute ischemic stroke ([place]) 12/15/2016  . Urinary retention 02/25/2015  . Primary osteoarthritis of left knee 02/15/2015  . Morbid obesity ([place]) 02/15/2015  . Essential hypertension, benign 09/07/2014  . Constipation 09/07/2014  . Hyperlipidemia 09/05/2014  . Gout 09/05/2014  . Acute blood loss anemia 09/05/2014  . Neuropathy ([place]) 09/05/2014  . UTI (urinary tract infection) 08/31/2014  . Radiculopathy 08/29/2014    Expected Discharge Date: Expected Discharge Date: 12/31/16  Team Members Present: Physician leading conference: Dr. 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  Improve back pain/radiculopathy, gout constipation, safety, mobility  See above   Bowel/Bladder   continent of b/b, constipation, lbm 1/24 after suppository  contnient of b/b and normal bm every 1-2 days with min assist  min assist with toileting, medications as needed, assess q shift and prn   Swallow/Nutrition/ Hydration             ADL's   overall steadying A   Mod I/supervision overall  left NMR, left body awareness, static to dynamic standign balance, transferws, actiivty tolerance fam education   Mobility   Supervision overall with current distances of 200 ft with ambulation (all with RW)  Pt's goal have been upgraded to Mod I overall with Car transfer and stairs being Supervision  LLE/LUE NMR, increase attention to L side of body and environment, transfers, ambulation, activity tolerance, balance, pt education   Communication             Safety/Cognition/ Behavioral Observations            Pain   pain control with lidocaine patches, voltaren gel, sched gabapentin 400mg  tid and prn tylenol/robaxin  pain at or below2  assess q shift and prn   Skin   no skin issue  no new breakdown in rehab  assess q shift and prn      *See Care Plan and progress notes for long and short-term goals.  Barriers to Discharge: Pain, safety,  mobility, gout flare, constipation    Possible Resolutions to Barriers:  Gout meds, therapies, adjusting bowel meds    Discharge Planning/Teaching Needs:  Doing well in therapies goals upgraded to mod/i level. Family trying to provide 24 hr supervision and will need to come in for family education prior to discharge.      Team Discussion:  Goals supervision-mod/i level. Back pain is being managed by MD and Gout flare is better. Family education with daughter tomorrow prior to discharge. Pt agreeable to the plan-insurance push for DC tomorrow.  Revisions to Treatment Plan:  DC  tomorrow upgraded some goals to mod/i level   Continued Need for Acute Rehabilitation Level of Care: The patient requires daily medical management by a physician with specialized training in physical medicine and rehabilitation for the following conditions: Daily direction of a multidisciplinary physical rehabilitation program to ensure safe treatment while eliciting the highest outcome that is of practical value to the patient.: Yes Daily medical management of patient stability for increased activity during participation in an intensive rehabilitation regime.: Yes Daily analysis of laboratory values and/or radiology reports with any subsequent need for medication adjustment of medical intervention for : Neurological problems;Other  Ellen Henry 12/30/2016, 3:29 PM

## 2016-12-30 NOTE — Progress Notes (Signed)
Occupational Therapy Session Note  Patient Details  Name: Ellen Henry MRN: [id number] Date of Birth: 10-28-1946  Today's Date: 12/30/2016 OT Individual Time: 0800-0900 OT Individual Time Calculation (min): 60 min   Skilled Therapeutic Interventions/Progress Updates:    1:1 OT session focused on L NMR,fine motor coordination, and modified bathing/dressing. Pt already sponged off and complete UB dressing upon OT arrival, pt awaiting pants from dryer.  While RN administering pain meds, worked pn fine motor coordination and pinch strength using yellow soft thera-putty. Retrieved pants from dryer and worked on [employer] dressing  Incorporating forced use of L UE- demonstrated improved L UE strength and ROM when pulling pants over hips. Educated pton donning TED hose using modified technique and long-handled shoe horn for donning shoes with set-up. Pt brought to gym and worked on Product/process development scientist, palmer translation and rotation with peg board activity. Handoff to PT in therapy gym.   Therapy Documentation Precautions:  Precautions Precautions: Fall Precaution Comments: L inattention, L sided weakness Restrictions Weight Bearing Restrictions: No Pain: Pain Assessment Pain Score: 0-No pain ADL: ADL ADL Comments: please see functional navigator  See Function Navigator for Current Functional Status.   Therapy/Group: Individual Therapy  [name] 12/30/2016, 8:52 AM

## 2016-12-30 NOTE — Progress Notes (Signed)
Physical Therapy Session Note  Patient Details  Name: Ellen Henry MRN: [id number] Date of Birth: 07-06-1946  Today's Date: 12/30/2016 PT Individual Time: 0904-1001 PT Individual Time Calculation (min): 57 min   Short Term Goals: Week 2:  PT Short Term Goal 1 (Week 2): Patient will perform bed mobility on regular bed with supervision. PT Short Term Goal 2 (Week 2): Patient will perform transfers bed <> wheelchair with supervision and min verbal cues for technique.  PT Short Term Goal 3 (Week 2): Patient will ambulate 50 ft using LRAD with supervision. PT Short Term Goal 4 (Week 2): Patient will negotiate up/down 4 steps with 1 rail and mod A.   Skilled Therapeutic Interventions/Progress Updates:  Pt was present in rehab gym on mat with OT and was passed on to PT . Pt was not complaining of any pain prior to or during therapy.  All bed mobility and transfers required supervision with min verbal cues for sequencing.   Pt performed 2x10 Supine bridges with hip add with ball for NMR. Pt attempted 8 reps of Sidelying clam shells on right but were terminated due to inability to efficient target hip abductors  While in supine, PNF was performed for NMR. D1 LE pattern for 10 min on the left only. Pt tolerated new activity.   With RW, pt performed 1x10 standing hip abd and 2 x 10 toe taps with 4 inch step with RLE only to target hip abductors on affected limb. Pt had 1 lbs ankle weight to add resistance. Pt required min A to maintain neutral pelvic alignment.   Pt ambulated with quad cane for 90 ft with min A for pelvic stability. Occasional verbal cues for sequence.   Pt was pushed back to room in W/C for use of restroom. Pt performed supervision ambulation transfer for 10 ft to use commode. Pt required supervision with positioning, and  removing and lifting clothes. Pt required set-up for hygiene.   Nurse arrived as pt was donning pants. Pt was left in nurse's care     Therapy  Documentation Precautions:  Precautions Precautions: Fall Precaution Comments: L inattention, L sided weakness Restrictions Weight Bearing Restrictions: No   See Function Navigator for Current Functional Status.   Therapy/Group: Individual Therapy  Rosendo Gros 12/30/2016, 7:27 AM

## 2016-12-30 NOTE — Progress Notes (Signed)
[facility] PHYSICAL MEDICINE & REHABILITATION     PROGRESS NOTE  Subjective/Complaints:  Pt seen sitting up in her chair this AM.  She slept well overnight and states she had a little success with the bowel reg yesterday.  She states she has hemmroids from straining for BMs.   ROS: +Constipation, hemroids.  Denies nausea, vomiting, diarrhea, shortness of breath or chest pain   Objective: Vital Signs: Blood pressure 129/80, pulse (!) 58, temperature 98 F (36.7 C), temperature source Oral, resp. rate 18, height 5' (1.524 m), weight 99.1 kg (218 lb 8 oz), SpO2 97 %. No results found. No results for input(s): WBC, HGB, HCT, PLT in the last 72 hours. No results for input(s): NA, K, CL, GLUCOSE, BUN, CREATININE, CALCIUM in the last 72 hours.  Invalid input(s): CO CBG (last 3)  No results for input(s): GLUCAP in the last 72 hours.  Wt Readings from Last 3 Encounters:  12/23/16 99.1 kg (218 lb 8 oz)  12/15/16 104.3 kg (230 lb)  03/10/15 108.9 kg (240 lb)    Physical Exam:  BP 129/80 (BP Location: Right Arm)   Pulse (!) 58   Temp 98 F (36.7 C) (Oral)   Resp 18   Ht 5' (1.524 m)   Wt 99.1 kg (218 lb 8 oz)   SpO2 97%   BMI 42.67 kg/m  Constitutional: She appears well-developed and well-nourished.  HENT: Normocephalic and atraumatic.  Eyes: EOMI. No discharge.  Cardiovascular: RRR. No JVD. Respiratory: clear bilaterally. Unlabored.  GI: Soft. Bowel sounds are normal.  Musculoskeletal: She exhibits no edema or tenderness.  Neurological: She is alert and oriented.  Left facial droop with mild dysarthria.  Motor: LUE: deltoid 3-3+/5 proximal to distal (unchanged) LLE: 4-/5 HF, 4/5 KE, ADF/DP 4+/5  Skin: Skin is warm and dry. Intact.   Assessment/Plan: 1. Functional deficits secondary to right post limb internal capsule infarct which require 3+ hours per day of interdisciplinary therapy in a comprehensive inpatient rehab setting. Physiatrist is providing close team supervision  and 24 hour management of active medical problems listed below. Physiatrist and rehab team continue to assess barriers to discharge/monitor patient progress toward functional and medical goals.  Function:  Bathing Bathing position Bathing activity did not occur:  (pt had shower on day shift) Position: Shower  Bathing parts Body parts bathed by patient: Left arm, Chest, Abdomen, Front perineal area, Buttocks, Right upper leg, Left upper leg, Right lower leg, Left lower leg, Right arm, Back Body parts bathed by helper: Right arm  Bathing assist Assist Level: Supervision or verbal cues Assistive Device Comment: long-handled sponge    Upper Body Dressing/Undressing Upper body dressing   What is the patient wearing?: Pull over shirt/dress, Bra Bra - Perfomed by patient: Thread/unthread right bra strap, Thread/unthread left bra strap Bra - Perfomed by helper: Hook/unhook bra (pull down sports bra) Pull over shirt/dress - Perfomed by patient: Thread/unthread right sleeve, Thread/unthread left sleeve, Put head through opening Pull over shirt/dress - Perfomed by helper: Pull shirt over trunk        Upper body assist Assist Level: Touching or steadying assistance(Pt > 75%)      Lower Body Dressing/Undressing Lower body dressing   What is the patient wearing?: Pants, Underwear, Non-skid slipper socks Underwear - Performed by patient: Thread/unthread right underwear leg, Thread/unthread left underwear leg, Pull underwear up/down Underwear - Performed by helper: Pull underwear up/down Pants- Performed by patient: Thread/unthread right pants leg, Thread/unthread left pants leg, Pull pants up/down Pants-  Performed by helper: Pull pants up/down Non-skid slipper socks- Performed by patient: Don/doff right sock, Don/doff left sock Non-skid slipper socks- Performed by helper: Don/doff left sock, Don/doff right sock       Shoes - Performed by helper: Don/doff right shoe, Don/doff left shoe, Fasten  right, Fasten left          Lower body assist Assist for lower body dressing: Supervision or verbal cues      Toileting Toileting   Toileting steps completed by patient: Performs perineal hygiene, Adjust clothing after toileting Toileting steps completed by helper: Adjust clothing prior to toileting, Adjust clothing after toileting Toileting Assistive Devices: Grab bar or rail  Toileting assist Assist level: Supervision or verbal cues   Transfers Chair/bed transfer   Chair/bed transfer method: Ambulatory Chair/bed transfer assist level: Supervision or verbal cues Chair/bed transfer assistive device: Armrests, Environmental consultant, Orthosis     Locomotion Ambulation     Max distance: 200 ft Assist level: Supervision or verbal cues   Wheelchair   Type: Manual Max wheelchair distance: 100 Assist Level: Supervision or verbal cues  Cognition Comprehension Comprehension assist level: Follows complex conversation/direction with extra time/assistive device  Expression Expression assist level: Expresses complex ideas: With no assist  Social Interaction Social Interaction assist level: Interacts appropriately with others - No medications needed.  Problem Solving Problem solving assist level: Solves complex problems: With extra time  Memory Memory assist level: Requires cues to use assistive device     Medical Problem List and Plan:  1. 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  Cont CIR  Bracing ordered  Fluoxetine started 1/12 2. DVT Prophylaxis/Anticoagulation: Pharmaceutical: Lovenox  3. Back pain/RLE neuropathy/Pain Management: improved control  Neurontin increased to 300 TID on 1/14  Robaxin PRN started 1/12  CT reviewed from 2015 showing L4-5 post-surgical changes, L3-4 stable impingement.   L-spine Xrays 1/15 reviewed, showing stable post-surgical changes L4-5  Lidoderm patches x2 started 1/16 with benefit  Kpad 4. Mood: team to provide ego  support. LCSW to follow for evaluation and support.  5. Neuropsych: This patient is capable of making decisions on her own behalf.  6. Skin/Wound Care: routine pressure relief measures.  7. Fluids/Electrolytes/Nutrition: Monitor I/O.   Dysphag 3 thin, advance as tolerated.  BMP WNL 1/18, no clinical signs of dehydration 8. [id number] BP bid  Controlled 1/24 9. Urinary retention: Improving  UA neg, Ucx NG.   Scheduled toileting--monitor voiding with PVR neg.  10. Chronic constipation:   Uses suppository every other day PTA.   Senna S increased to BID  Miralax increased to BID  Milk of Mag/Dulcolax x1 on 1/23, again 1/24  Suppository daily as needed.  11. Hypokalemia : Likely dilutional-- supplemented 1/11.   WNL on 1/18 12. Dyslipidemia: On zocor  13. Tobacco abuse: Nicotine patch. Encourage cessation.  14. H/o Gout:   Colchicine started 1/17 15. Right knee pain  Voltaren gel ordered 1/17  Cont Robaxin PRN  Improved     LOS (Days) 13 A FACE TO FACE EVALUATION WAS PERFORMED  Ankit Lorie Phenix 12/30/2016 8:37 AM

## 2016-12-31 ENCOUNTER — Inpatient Hospital Stay (HOSPITAL_COMMUNITY): Payer: Self-pay | Admitting: Occupational Therapy

## 2016-12-31 ENCOUNTER — Inpatient Hospital Stay (HOSPITAL_COMMUNITY): Payer: Medicare Other | Admitting: *Deleted

## 2016-12-31 ENCOUNTER — Inpatient Hospital Stay (HOSPITAL_COMMUNITY): Payer: Self-pay | Admitting: Physical Therapy

## 2016-12-31 LAB — CBC
HCT: 42.7 % (ref 36.0–46.0)
Hemoglobin: 14.6 g/dL (ref 12.0–15.0)
MCH: 28.5 pg (ref 26.0–34.0)
MCHC: 34.2 g/dL (ref 30.0–36.0)
MCV: 83.2 fL (ref 78.0–100.0)
Platelets: 210 10*3/uL (ref 150–400)
RBC: 5.13 MIL/uL — ABNORMAL HIGH (ref 3.87–5.11)
RDW: 14.1 % (ref 11.5–15.5)
WBC: 9.1 10*3/uL (ref 4.0–10.5)

## 2016-12-31 LAB — BASIC METABOLIC PANEL
Anion gap: 9 (ref 5–15)
BUN: 13 mg/dL (ref 6–20)
CO2: 23 mmol/L (ref 22–32)
Calcium: 9.9 mg/dL (ref 8.9–10.3)
Chloride: 107 mmol/L (ref 101–111)
Creatinine, Ser: 0.79 mg/dL (ref 0.44–1.00)
GFR calc Af Amer: >60 mL/min (ref >60)
GFR calc non Af Amer: >60 mL/min (ref >60)
Glucose, Bld: 87 mg/dL (ref 65–99)
Potassium: 4.2 mmol/L (ref 3.5–5.1)
Sodium: 139 mmol/L (ref 135–145)

## 2016-12-31 MED ORDER — POLYETHYLENE GLYCOL 3350 17 G PO PACK: 17.0000 g | PACK | Freq: Two times a day (BID) | ORAL | 0 refills | Status: DC

## 2016-12-31 MED ORDER — NICOTINE 14 MG/24HR TD PT24: 1430.0000 mg | MEDICATED_PATCH | Freq: Every day | TRANSDERMAL | 0 refills | Status: DC

## 2016-12-31 MED ORDER — GABAPENTIN 400 MG PO CAPS: 400.0000 mg | ORAL_CAPSULE | Freq: Three times a day (TID) | ORAL | 0 refills | Status: DC

## 2016-12-31 MED ORDER — FLUOXETINE HCL 10 MG PO CAPS: 10.0000 mg | ORAL_CAPSULE | Freq: Every day | ORAL | 0 refills | Status: DC

## 2016-12-31 MED ORDER — DICLOFENAC SODIUM 1 % TD GEL: 2.0000 g | Freq: Four times a day (QID) | TRANSDERMAL | 0 refills | Status: DC

## 2016-12-31 MED ORDER — LIDOCAINE 5 % EX PTCH: MEDICATED_PATCH | CUTANEOUS | 0 refills | Status: DC

## 2016-12-31 MED ORDER — ACETAMINOPHEN 325 MG PO TABS: 325.0000 mg | ORAL_TABLET | Freq: Four times a day (QID) | ORAL | Status: DC | PRN

## 2016-12-31 MED ORDER — METHOCARBAMOL 500 MG PO TABS: 500.0000 mg | ORAL_TABLET | Freq: Three times a day (TID) | ORAL | 0 refills | Status: AC | PRN

## 2016-12-31 MED ORDER — ATORVASTATIN CALCIUM 40 MG PO TABS: 40.0000 mg | ORAL_TABLET | Freq: Every day | ORAL | 0 refills | Status: DC

## 2016-12-31 NOTE — Progress Notes (Signed)
  Patient Details  Name: Ellen Henry MRN: [id number] Date of Birth: 1946/10/03 Today's Date: 12/31/2016  Session Note: Pain:  No c/o  Pt referred to and participated in community reintegration/outing to the grocery store at overall supervision ambulatory level using RW with the exception of negotiating bathroom doors in which she required contact guard assist and verbal cues for safety.  Pt ambulated >1000' on even/uneven surfaces.  Goals focused on safe community mobility, identification & negotiation of obstacles, accessing public restroom, & energy conservation techniques/education.  See outing goal sheet in shadow chart for full details.   Recreational Therapy Discharge Summary  Comments on progress toward goals: Pt has made great progress during LOS and is ready for discharge home with family to provide 24 hour supervision.  Pt participated in community reintegration at supervision ambulatory level with RW.  Education provided on energy conservation & safe community mobility. Goals met.  Reasons for discharge: discharge from hospital  Patient/family agrees with progress made and goals achieved: Yes  SIMPSON,LISA 12/31/2016, 10:18 AM

## 2016-12-31 NOTE — Progress Notes (Signed)
Patient and family received discharge instructions from Pam Love, PA-C with verbal understanding. Patient discharged to home with family and belongings. 

## 2016-12-31 NOTE — Discharge Summary (Signed)
Physician Discharge Summary  Patient ID: Ellen Henry MRN: [id number] DOB/AGE: [birth year] 71 y.o.  Admit date: 12/17/2016 Discharge date: 12/31/2016  Discharge Diagnoses:  Principal Problem:   Stroke (cerebrum) [facility]) Active Problems:   Left hemiparesis (HCC)   Benign essential HTN   Slow transit constipation   Labile blood pressure   Radicular pain   Failed back syndrome   Acute idiopathic gout involving toe of right foot   Acute pain of right knee   Chronic constipation   Discharged Condition: stable   Significant Diagnostic Studies: N/A   Labs:  Basic Metabolic Panel: BMP Latest Ref Rng & Units 12/31/2016 12/24/2016 12/22/2016  Glucose 65 - 99 mg/dL 87 97 94  BUN 6 - 20 mg/dL 13 15 18   Creatinine 0.44 - 1.00 mg/dL 0.79 0.65 0.76  Sodium 135 - 145 mmol/L 139 139 138  Potassium 3.5 - 5.1 mmol/L 4.2 3.9 4.0  Chloride 101 - 111 mmol/L 107 107 103  CO2 22 - 32 mmol/L 23 24 22   Calcium 8.9 - 10.3 mg/dL 9.9 9.5 9.7    CBC: CBC Latest Ref Rng & Units 12/31/2016 12/24/2016 12/22/2016  WBC 4.0 - 10.5 K/uL 9.1 8.7 8.5  Hemoglobin 12.0 - 15.0 g/dL 14.6 13.6 14.8  Hematocrit 36.0 - 46.0 % 42.7 41.0 44.2  Platelets 150 - 400 K/uL 210 234 202    CBG: No results for input(s): GLUCAP in the last 168 hours.   Brief HPI:   Ellen Henry is a 71 y.o. female with history of HTN, chronic back pain with radiculopathy, OSA who was admitted on 12/14/16 with complaints of diffuse weakness. She was found to have left facial weakness on evaluation by EMS and symptoms resolved in ED therefore tPA not administered. CT head negative. She developed left sided weakness later that evening and follow up MRI brain revealed acute infarct posterior limb right internal capsule. She was started on ASA for secondary stroke prevention.  Therapy evaluations done and patient limited by pain, left sided weakness and dysphagia.  CIR recommended for follow up therapy   Hospital Course: Ellen Henry was  admitted to rehab 12/17/2016 for inpatient therapies to consist of PT, ST and OT at least three hours five days a week. Past admission physiatrist, therapy team and rehab RN have worked together to provide customized collaborative inpatient rehab. She was placed prozac to help with motor recovery. Blood pressures have been monitored on bid basis and have normalized without medications. Hypokalemia was briefly supplemented with resolution. Dysphagia has resolved and she is tolerating regular textures without signs of aspiration. She was started on bladder program with scheduled voiding to help with frequency. Urine culture was negative for infection and bladder function has improved. She has had limitations due to back pain and radiculopathy therefore Neurontin was added and titrated to 400 mg tid. Lumbar spine films done 12/21/16 showing stable post surgical changes L4/5 and   Lidocaine patches were added for local measures with good results. Knee pain is managed with voltaren gel. Bowel program has been augmented to help with chronic constipation and patient was advised on increasing medications as needed.  She did report gout flare on 1/17 and colchicine was resumed.  She has made steady progress during her rehab stay and is modified independent to supervision level at discharge. She will continue to receive follow up HHPT and [place] [facility] after discharge.    Rehab course: During patient's stay in rehab weekly team conferences were  held to monitor patient's progress, set goals and discuss barriers to discharge. At admission, patient required mod assist with mobility and max assist with basic self care tasks.  Speech therapy has focused on safe swallow strategies and on cognitive tasks. She has had improvement in activity tolerance, balance, postural control, as well as ability to compensate for deficits. She is has had improvement in functional use LUE  and  LLE as well as improved awareness.  She  is able to complete ADL tasks at modified independent level. She requires supervision for shower/tub transfers. She is modified independent for stand pivot transfers and to ambulate 250' with RW.  She is able to perform mildly complex tasks with extra time at modified independent level. Cognition is at baseline and ST signed off. Family education was completed regarding all aspects of mobility, care and safety.     Disposition: 01-Home or Self Care  Diet: heart Healthy  Special Instructions: 1. Needs supervision for all activity. 2. No driving till cleared by MD.   Allergies as of 12/31/2016      Reactions   Food Allergy Formula Other (See Comments)   Shellfish-banana-beef-flares up her gout      Medication List    STOP taking these medications   food thickener Powd Commonly known as:  THICK IT   indomethacin 25 MG capsule Commonly known as:  INDOCIN   nicotine 7 mg/24hr patch Commonly known as:  NICODERM CQ - dosed in mg/24 hr Replaced by:  nicotine 14 mg/24hr patch   oxyCODONE 5 MG immediate release tablet Commonly known as:  Oxy IR/ROXICODONE   potassium chloride 20 MEQ packet Commonly known as:  KLOR-CON     TAKE these medications   acetaminophen 325 MG tablet Commonly known as:  TYLENOL Take 1-2 tablets (325-650 mg total) by mouth every 6 (six) hours as needed for mild pain. What changed:  medication strength  how much to take   aspirin 325 MG EC tablet Take 1 tablet (325 mg total) by mouth daily.   atorvastatin 40 MG tablet Commonly known as:  LIPITOR Take 1 tablet (40 mg total) by mouth daily at 6 PM.   colchicine 0.6 MG tablet Take 1 tablet (0.6 mg total) by mouth daily. What changed:  when to take this  reasons to take this   diclofenac sodium 1 % Gel Commonly known as:  VOLTAREN Apply 2 g topically 4 (four) times daily.   FLUoxetine 10 MG capsule Commonly known as:  PROZAC Take 1 capsule (10 mg total) by mouth daily. Start taking on:   01/01/2017   gabapentin 400 MG capsule Commonly known as:  NEURONTIN Take 1 capsule (400 mg total) by mouth 3 (three) times daily.   lidocaine 5 % Commonly known as:  LIDODERM Apply at 6 am and remove at 6 pm daily.   methocarbamol 500 MG tablet Commonly known as:  ROBAXIN Take 1 tablet (500 mg total) by mouth every 8 (eight) hours as needed for muscle spasms. Notes to patient:  Insurance usually does not pay for this. You can by lidocaine over the counter patches.    nicotine 14 mg/24hr patch Commonly known as:  NICODERM CQ - dosed in mg/24 hours Place 102 patches (1,430 mg total) onto the skin daily. Replaces:  nicotine 7 mg/24hr patch   polyethylene glycol packet Commonly known as:  MIRALAX / GLYCOLAX Take 17 g by mouth 2 (two) times daily. Notes to patient:  Continue this for 4 weeks. Then decrease  to 7 mg daily for 6 weeks. Then stop   senna-docusate 8.6-50 MG tablet Commonly known as:  Senokot-S Take 2 tablets by mouth 2 (two) times daily. What changed:  how much to take  when to take this  reasons to take this Notes to patient:  For constipation      Follow-up Information    Ankit Lorie Phenix, MD Follow up.   Specialty:  Physical Medicine and Rehabilitation Why:  office will call you with follow up appointment Contact information: [place] [address] [phone]        Antony Contras, MD. Call in 1 day(s).   Specialties:  Neurology, Radiology Why:  for follow up appointment in 4-6 weeks.  Contact information: [address] [place] [address] [phone]        Elyn Peers, MD Follow up on 01/01/2017.   Specialty:  Family Medicine Why:  Appointment @ 2:45 PM Contact information: [place] [address] [place] [address] [phone]           Signed: Bary Leriche 12/31/2016, 1:32 PM

## 2016-12-31 NOTE — Discharge Instructions (Signed)
Inpatient Rehab Discharge Instructions  Ellen Henry Discharge date and time: 12/31/16   Activities/Precautions/ Functional Status: Activity: no lifting, driving, or strenuous exercise  till cleared by MD Diet: cardiac diet Wound Care: none needed   Functional status:  ___ No restrictions     ___ Walk up steps independently _X__ 24/7 supervision/assistance   ___ Walk up steps with assistance ___ Intermittent supervision/assistance  ___ Bathe/dress independently _X_ Walk with walker     _X__ Bathe/dress with assistance ___ Walk Independently    ___ Shower independently ___ Walk with assistance    ___ Shower with assistance _X__ No alcohol     ___ Return to work/school ________  Special Instructions:   COMMUNITY REFERRALS UPON DISCHARGE:    Home Health:   [facility]   Date of last service:12/31/2016  Medical Equipment/Items Ordered:WHEELCHAIR  Agency/Supplier:[facility]   [phone]   GENERAL COMMUNITY RESOURCES FOR PATIENT/FAMILY: Support Groups:[facility] SUPPORT GROUP EVERY SECOND Thursday @ 3:00-4:00 PM ON THE REHAB UNIT QUESTIONS CONTACT CAIT[place]N [id number]  STROKE/TIA DISCHARGE INSTRUCTIONS SMOKING Cigarette smoking nearly doubles your risk of having a stroke & is the single most alterable risk factor  If you smoke or have smoked in the last 12 months, you are advised to quit smoking for your health.  Most of the excess cardiovascular risk related to smoking disappears within a year of stopping.  Ask you doctor about anti-smoking medications  [place] Quit Line: 1-800-QUIT NOW  Free Smoking Cessation Classes [phone]  CHOLESTEROL Know your levels; limit fat & cholesterol in your diet  Lipid Panel     Component Value Date/Time   CHOL 224 (H) 12/16/2016 0807   TRIG 61 12/16/2016 0807   HDL 65 12/16/2016 0807   CHOLHDL 3.4 12/16/2016 0807   VLDL 12 12/16/2016 0807   LDLCALC 147 (H) 12/16/2016 0807      Many  patients benefit from treatment even if their cholesterol is at goal.  Goal: Total Cholesterol (CHOL) less than 160  Goal:  Triglycerides (TRIG) less than 150  Goal:  HDL greater than 40  Goal:  LDL (LDLCALC) less than 100   BLOOD PRESSURE American Stroke Association blood pressure target is less that 120/80 mm/Hg  Your discharge blood pressure is:  BP: 124/84  Monitor your blood pressure  Limit your salt and alcohol intake  Many individuals will require more than one medication for high blood pressure  DIABETES (A1c is a blood sugar average for last 3 months) Goal HGBA1c is under 7% (HBGA1c is blood sugar average for last 3 months)  Diabetes: No known diagnosis of diabetes    Lab Results  Component Value Date   HGBA1C 5.4 12/16/2016     Your HGBA1c can be lowered with medications, healthy diet, and exercise.  Check your blood sugar as directed by your physician  Call your physician if you experience unexplained or low blood sugars.  PHYSICAL ACTIVITY/REHABILITATION Goal is 30 minutes at least 4 days per week  Activity: No driving, Therapies: See above Return to work: N/A  Activity decreases your risk of heart attack and stroke and makes your heart stronger.  It helps control your weight and blood pressure; helps you relax and can improve your mood.  Participate in a regular exercise program.  Talk with your doctor about the best form of exercise for you (dancing, walking, swimming, cycling).  DIET/WEIGHT Goal is to maintain a healthy weight  Your discharge diet is: Diet Heart  Room service appropriate? Yes; Fluid consistency: Thin  liquids Your height is:  Height: 5' (152.4 cm) Your current weight is: Weight: 99.1 kg (218 lb 8 oz) Your Body Mass Index (BMI) is:  BMI (Calculated): 44.5  Following the type of diet specifically designed for you will help prevent another stroke.  Your goal weight is:  128 lbs  Your goal Body Mass Index (BMI) is 19-24.  Healthy food  habits can help reduce 3 risk factors for stroke:  High cholesterol, hypertension, and excess weight.  RESOURCES Stroke/Support Group:  Call [phone]   STROKE EDUCATION PROVIDED/REVIEWED AND GIVEN TO PATIENT Stroke warning signs and symptoms How to activate emergency medical system (call 911). Medications prescribed at discharge. Need for follow-up after discharge. Personal risk factors for stroke. Pneumonia vaccine given:  Flu vaccine given:  My questions have been answered, the writing is legible, and I understand these instructions.  I will adhere to these goals & educational materials that have been provided to me after my discharge from the hospital.     My questions have been answered and I understand these instructions. I will adhere to these goals and the provided educational materials after my discharge from the hospital.  Patient/Caregiver Signature _______________________________ Date __________  Clinician Signature _______________________________________ Date __________  Please bring this form and your medication list with you to all your follow-up doctor's appointments.

## 2016-12-31 NOTE — Progress Notes (Signed)
Occupational Therapy Discharge Summary  Patient Details  Name: Ellen Henry MRN: [id number] Date of Birth: [birth year]  Patient has met 75 of 10 long term goals due to improved activity tolerance, improved balance, postural control, ability to compensate for deficits, functional use of  LEFT upper and LEFT lower extremity and improved coordination.  Patient to discharge at overall Mod I to Supervision level.  Patient's care partner is independent to provide the necessary physical assistance at discharge.    Reasons goals not met:  n/a  Recommendation:  Patient will benefit from ongoing skilled OT services in home health setting to continue to advance functional skills in the area of BADL.  Equipment: wheelchair  Reasons for discharge: treatment goals met and discharge from hospital  Patient/family agrees with progress made and goals achieved: Yes  OT Discharge Precautions/Restrictions  Precautions Precautions: None Precaution Comments: L inattention, L sided weakness Restrictions Weight Bearing Restrictions: No Pain Pain Assessment Pain Assessment: No/denies pain ADL ADL Equipment Provided: Long-handled shoe horn, Long-handled sponge Eating: Independent Grooming: Modified independent Upper Body Bathing: Modified independent Where Assessed-Upper Body Bathing: Shower Lower Body Bathing: Supervision/safety Where Assessed-Lower Body Bathing: Shower Upper Body Dressing: Modified independent (Device) Where Assessed-Upper Body Dressing: Wheelchair Lower Body Dressing: Supervision/safety Where Assessed-Lower Body Dressing: Wheelchair Toileting: Modified independent Where Assessed-Toileting: Glass blower/designer: Close supervision Toilet Transfer Method: Counselling psychologist: Energy manager: Close supervision Social research officer, government Method: Heritage manager: Civil engineer, contracting with back ADL Comments: please see functional  navigator Cognition Overall Cognitive Status: Within Functional Limits for tasks assessed Arousal/Alertness: Awake/alert Orientation Level: Oriented X4 Sensation Sensation Light Touch: Appears Intact Coordination Gross Motor Movements are Fluid and Coordinated: Yes Fine Motor Movements are Fluid and Coordinated: Yes Coordination and Movement Description: improved L UE fine motor coordination, able to use L UE at diminshed level Motor  Motor Motor: Hemiplegia Trunk/Postural Assessment  Cervical Assessment Cervical Assessment: Within Functional Limits Thoracic Assessment Thoracic Assessment: Within Functional Limits Lumbar Assessment Lumbar Assessment: Within Functional Limits Postural Control Postural Control: Deficits on evaluation Protective Responses: Protective response is still impaired but improved since evaluation  Balance Balance Static Sitting Balance Static Sitting - Level of Assistance: 7: Independent Dynamic Sitting Balance Dynamic Sitting - Level of Assistance: 6: Modified independent (Device/Increase time) Sitting balance - Comments: required mod v/c for upright posture during upper body dressing.  Static Standing Balance Static Standing - Level of Assistance: 6: Modified independent (Device/Increase time) Dynamic Standing Balance Dynamic Standing - Balance Support: Bilateral upper extremity supported;During functional activity Dynamic Standing - Level of Assistance: 6: Modified independent (Device/Increase time) Extremity/Trunk Assessment RUE Assessment RUE Assessment: Within Functional Limits LUE Assessment LUE Assessment:  (Much improved L UE strength) LUE AROM (degrees) Overall AROM Left Upper Extremity: Deficits LUE Strength LUE Overall Strength: Deficits LUE Overall Strength Comments:  L finger/wrist ext:  3/5; L shoulder abd: 4/5  See Function Navigator for Current Functional Status.  Daneen Schick Doe 12/31/2016, 4:42 PM

## 2016-12-31 NOTE — Progress Notes (Signed)
Occupational Therapy Session Note  Patient Details  Name: Ellen Henry MRN: [id number] Date of Birth: 1946-09-13  Today's Date: 12/31/2016 OT Individual Time: 0900-1000 OT Individual Time Calculation (min): 60 min    Short Term Goals: Week 2:  OT Short Term Goal 1 (Week 2): Pt will use L UE for bathing tasks with contact gaurd A OT Short Term Goal 2 (Week 2): Pt will pull pants up using L UE with supervision OT Short Term Goal 3 (Week 2): Pt will don socks with Min A and ADL AE as needed  Skilled Therapeutic Interventions/Progress Updates:    1:1 OT session focused on modified bathing/dressing, activity tolerance, simple iADL modifications, pt/family education, and L NMR/fine motor coordination. Pt completed ADLs with overall Mod I using RW for sit<>stands. Pt then brought to therapy apartment with daughter and OT instructed on safe technique for walk-in shower transfer. Pt/family demonstrated understanding with simulated shower transfer. Pt then accessed kitchen w/ RW and pt/therapist collaboration on home kitchen modifications and discussed simple meal prep. Pt then completed fine motor coordination activities in gym with graded clothes pins and graded peg board activity.   Therapy Documentation Precautions:  Precautions Precautions: None Precaution Comments: L inattention, L sided weakness Restrictions Weight Bearing Restrictions: No Pain: Pain Assessment Pain Assessment: No/denies pain ADL: ADL Equipment Provided: Long-handled shoe horn, Long-handled sponge Eating: Independent Grooming: Supervision/safety Upper Body Bathing: Supervision/safety Where Assessed-Upper Body Bathing: Shower Lower Body Bathing: Modified independent Where Assessed-Lower Body Bathing: Shower Upper Body Dressing: Modified independent (Device) Where Assessed-Upper Body Dressing: Wheelchair Lower Body Dressing: Supervision/safety Where Assessed-Lower Body Dressing: Wheelchair Toileting:  Supervision/safety Where Assessed-Toileting: Glass blower/designer: Close supervision Armed forces technical officer Method: Counselling psychologist: Energy manager: Close supervision Social research officer, government Method: Heritage manager: Civil engineer, contracting with back ADL Comments: please see functional navigator  See Function Navigator for Current Functional Status.   Therapy/Group: Individual Therapy  [name] 12/31/2016, 4:37 PM

## 2016-12-31 NOTE — Progress Notes (Signed)
Occupational Therapy Note  Patient Details  Name: Ellen Henry MRN: [id number] Date of Birth: [birth year]  Today's Date: 12/31/2016 OT Concurrent Time: 1030-1145 OT Concurrent Time Calculation (min): 75 min  Pt denied pain Individual Therapy ([place] with Recreational Therapist)  Pt participated in community outing/integration to local grocery store.  Focus on navigating steps in/out of van, functional amb with RW in community, employing energy conservation strategies, accessing public restrooms, and activity tolerance. Pt required steady A for stepping in/out of van but completed all other tasks at supervision level.  See goal sheet in shadow chart.    Leotis Shames [facility] 12/31/2016, 12:01 PM

## 2016-12-31 NOTE — Progress Notes (Signed)
Physical Therapy Discharge Summary  Patient Details  Name: Ellen Henry MRN: [id number] Date of Birth: 09-Sep-1946  Today's Date: 12/31/2016 PT Individual Time: 0800-0855 PT Individual Time Calculation (min): 55 min    Patient has met 9 of 9 long term goals due to improved activity tolerance, improved balance, improved postural control, increased strength, decreased pain, ability to compensate for deficits, functional use of  left lower extremity and left upper extremity and improved coordination.  Patient to discharge at an ambulatory level Mod I to Supervision.   Patient's care partner is independent to provide the necessary physical assistance at discharge.  Reasons goals not met: N/A  Recommendation:  Patient will benefit from ongoing skilled PT services in home health setting to continue to advance safe functional mobility, address ongoing impairments in gait, stairs, balance, activity tolerance, general LE strengthening and endurance, and minimize fall risk.  Equipment: 20x16 W/C with basic cushion.   Reasons for discharge: treatment goals met and discharge from hospital  Patient/family agrees with progress made and goals achieved: Yes   Skilled Therapeutic intervention:   Pt was in W/C dressed and ready to participate in therapy. Pt was complaining of no pain before or during therapy.   Family training with son in law completed for car transfer, stairs, and community ambulation with son demonstrated appropriate and safe supervision.   All sit-to-stand and stand pivot transfers were mod I during treatment.   Pt ambulated community distances with Mod I in controlled and home environment and supervision in community environment with use of Rw.   Pt ascended forward and descended laterally facing R hand rail 12 six inch steps with supervision.   Pt performed car transfer with supervision with no verbal cues required.   Pt performed one trial to pick up small cup from floor.  Pt was able to complete task without RW but with close supervision and extra time.   Pt ascended/descended ramp with RW and maneuvered 10 ft over uneven surface (artifical mulch) for 10 ft with supervision and use of RW .   Pt performed sit to supine and supine to sit transfers on standard bed twice Mod I with RW .  Pt used a 4 inch step stool for 2nd transfer and showed significant reduction in time and energy expenditure to get into bed. Pt was recommended to purchase aerobic step stool for this purpose.   Pt was provided supine HEP documents for continued therapy.   Pt's last BERG balance score was 31/56 on 12/30/2016 indicating increased risk of falling.   Pt was pushed back to room in W/C to rest for upcoming therapy. Pt was left in room with family and all needs within reach. Family was asked if any clarification from family education was needed and none was required.    PT Discharge Precautions/Restrictions Precautions Precautions: None    Pain Pain Assessment Pain Assessment: No/denies pain Vision/Perception   no apparent visual deficits  Cognition Overall Cognitive Status: Within Functional Limits for tasks assessed Arousal/Alertness: Awake/alert Orientation Level: Oriented X4 Attention: Selective Focused Attention: Appears intact Sustained Attention: Appears intact Memory: Appears intact Awareness: Appears intact Problem Solving: Appears intact Safety/Judgment: Appears intact Sensation Sensation Light Touch: Appears Intact Coordination Gross Motor Movements are Fluid and Coordinated: Yes Fine Motor Movements are Fluid and Coordinated: Yes Coordination and Movement Description: improved ability to advance LLE with gait with use of RW Motor  Motor Motor: Hemiplegia Motor - Skilled Clinical Observations:  (4/5 for R hip flexion; 3+/5 for  L hip flexion; 4+/5  knee flexion and extension bilaterally; 4+/5 R ankle DF and PF; 4/5 L ankle DF and PF)  Mobility Bed  Mobility Bed Mobility: Sit to Supine;Rolling Left;Rolling Right;Scooting to [facility];Supine to Sit;Left Sidelying to Sit Rolling Right: 6: Modified independent (Device/Increase time) Rolling Left: 6: Modified independent (Device/Increase time) Left Sidelying to Sit: 6: Modified independent (Device/Increase time) Supine to Sit: 6: Modified independent (Device/Increase time) Sit to Supine: 6: Modified independent (Device/Increase time) Scooting to [facility]: 6: Modified independent (Device/Increase time) Transfers Transfers: Yes Sit to Stand: 6: Modified independent (Device/Increase time);With armrests (with RW) Stand to Sit: 6: Modified independent (Device/Increase time);With armrests (with RW) Stand Pivot Transfers: 6: Modified independent (Device/Increase time);With armrests (with RW) Locomotion  Ambulation Ambulation: Yes Ambulation/Gait Assistance: 5: Supervision;6: Modified independent (Device/Increase time) (Mod I with home ambulation on same floor; supervision with outdoor/commuinity ambulation; must use RW at all times) Ambulation Distance (Feet): 250 Feet Assistive device: Rolling walker Ambulation/Gait Assistance Details: Verbal cues for sequencing;Verbal cues for gait pattern Ambulation/Gait Assistance Details: Supervision with community and outdoor amvbulation.  Gait Gait: Yes Gait Pattern: Within Functional Limits Gait Pattern: Step-through pattern;Trendelenburg;Poor foot clearance - left;Within Functional Limits (patient demonstrates adequate DR on LLE that reduces as pt fatigues, which leads to reduced foot clearance.  R hip abductor weakness adds to this issue.) Stairs / Additional Locomotion Stairs: Yes Stairs Assistance: 5: Supervision Stairs Assistance Details: Verbal cues for sequencing;Verbal cues for technique;Verbal cues for precautions/safety Stairs Assistance Details (indicate cue type and reason): Patient uses R hand rail ascending facing forward with R leg leading. When  descending, patient is facing towards the handrail and descends sideways with R leg leading and BUE support on handrail.  Stair Management Technique: One rail Right Number of Stairs: 12 Height of Stairs: 6 Ramp: 5: Supervision Curb: 5: Supervision  Trunk/Postural Assessment  Cervical Assessment Cervical Assessment: Within Functional Limits Thoracic Assessment Thoracic Assessment: Within Functional Limits Lumbar Assessment Lumbar Assessment: Within Functional Limits Postural Control Postural Control: Deficits on evaluation Protective Responses: Protective response is still impaired but improved since evaluation  Balance Balance Balance Assessed: Yes Standardized Balance Assessment Standardized Balance Assessment: Berg Balance Test Berg Balance Test Sit to Stand: Able to stand without using hands and stabilize independently Standing Unsupported: Able to stand 2 minutes with supervision Sitting with Back Unsupported but Feet Supported on Floor or Stool: Able to sit 2 minutes under supervision Stand to Sit: Uses backs of legs against chair to control descent Transfers: Able to transfer with verbal cueing and /or supervision Standing Unsupported with Eyes Closed: Able to stand 10 seconds with supervision Standing Ubsupported with Feet Together: Needs help to attain position but able to stand for 30 seconds with feet together From Standing, Reach Forward with Outstretched Arm: Can reach confidently >25 cm (10") From Standing Position, Pick up Object from Floor: Able to pick up shoe, needs supervision From Standing Position, Turn to Look Behind Over each Shoulder: Looks behind from both sides and weight shifts well Turn 360 Degrees: Needs assistance while turning Standing Unsupported, Alternately Place Feet on Step/Stool: Needs assistance to keep from falling or unable to try Standing Unsupported, One Foot in Front: Able to take small step independently and hold 30 seconds Standing on One  Leg: Unable to try or needs assist to prevent fall Total Score: 31 Static Sitting Balance Static Sitting - Level of Assistance: 7: Independent Dynamic Sitting Balance Dynamic Sitting - Level of Assistance: 6: Modified independent (Device/Increase time) Static Standing Balance  Static Standing - Level of Assistance: 6: Modified independent (Device/Increase time) Dynamic Standing Balance Dynamic Standing - Balance Support: Bilateral upper extremity supported;During functional activity Dynamic Standing - Level of Assistance: 6: Modified independent (Device/Increase time) Extremity Assessment      RLE Assessment RLE Assessment: Within Functional Limits (4/5 hip flexion; 4+/5 for knee extension and flexion; 4/5 for DF and PF) LLE Assessment LLE Assessment: Within Functional Limits LLE Strength LLE Overall Strength Comments: 3+/5 for hip flexion; 4+/5 for knee extension and flexion; 4/5 for DF and PF Left Hip Flexion: 3+/5 Left Knee Flexion: 4+/5 Left Knee Extension: 4+/5 Left Ankle Dorsiflexion: 4/5 Left Ankle Plantar Flexion: 4/5   See Function Navigator for Current Functional Status.  Rosendo Gros 12/31/2016, 12:47 PM

## 2016-12-31 NOTE — Progress Notes (Signed)
Social Work  Discharge Note  The overall goal for the admission was met for:   Discharge location: Yes-HOME WITH DAUGHTER AND GRANDCHILDREN  Length of Stay: Yes-14 DAYS  Discharge activity level: Yes-MOD/I-SUPERVISION LEVEL  Home/community participation: Yes  Services provided included: MD, RD, PT, OT, SLP, RN, CM, TR, Pharmacy and SW  Financial Services: Private Insurance: [facility]  Follow-up services arranged: Home Health: [place] [facility]-PT,OT,RN, DME: [place] and Patient/Family has no preference for HH/DME agencies  Comments (or additional information):PT DID [place] SOONER THAN EXPECTED-FAMILY TO PROVIDE 24 HR SUPERVISION FOR SHORT TIME AT HOME TO [place].  Patient/Family verbalized understanding of follow-up arrangements: Yes  Individual responsible for coordination of the follow-up plan: SELF & DIEA-DAUGHTER  Confirmed correct DME delivered: [name] 12/31/2016    [name]

## 2016-12-31 NOTE — Progress Notes (Signed)
[place] PHYSICAL MEDICINE & REHABILITATION     PROGRESS NOTE  Subjective/Complaints:  Pt seen sitting up in bed this AM.  She slept well overnight.  She is happy to be discharged today and go home.     ROS: Denies nausea, vomiting, diarrhea, shortness of breath or chest pain  Objective: Vital Signs: Blood pressure 104/73, pulse 87, temperature 97.8 F (36.6 C), temperature source Oral, resp. rate 18, height 5' (1.524 m), weight 99.1 kg (218 lb 8 oz), SpO2 100 %. No results found.  Recent Labs  12/31/16 0525  WBC 9.1  HGB 14.6  HCT 42.7  PLT 210    Recent Labs  12/31/16 0525  NA 139  K 4.2  CL 107  GLUCOSE 87  BUN 13  CREATININE 0.79  CALCIUM 9.9   CBG (last 3)  No results for input(s): GLUCAP in the last 72 hours.  Wt Readings from Last 3 Encounters:  12/23/16 99.1 kg (218 lb 8 oz)  12/15/16 104.3 kg (230 lb)  03/10/15 108.9 kg (240 lb)    Physical Exam:  BP 104/73 (BP Location: Right Arm)   Pulse 87   Temp 97.8 F (36.6 C) (Oral)   Resp 18   Ht 5' (1.524 m)   Wt 99.1 kg (218 lb 8 oz)   SpO2 100%   BMI 42.67 kg/m  Constitutional: She appears well-developed and well-nourished.  HENT: Normocephalic and atraumatic.  Eyes: EOMI. No discharge.  Cardiovascular: RRR. No JVD. Respiratory: clear bilaterally. Unlabored.  GI: Soft. Bowel sounds are normal.  Musculoskeletal: She exhibits no edema or tenderness.  Neurological: She is alert and oriented.  Left facial droop with mild dysarthria.  Motor: LUE: deltoid 3-3+/5 proximal to distal (stable) LLE: 4-/5 HF, 4/5 KE, ADF/DP 4+/5  Skin: Skin is warm and dry. Intact.   Assessment/Plan: 1. Functional deficits secondary to right post limb internal capsule infarct which require 3+ hours per day of interdisciplinary therapy in a comprehensive inpatient rehab setting. Physiatrist is providing close team supervision and 24 hour management of active medical problems listed below. Physiatrist and rehab team  continue to assess barriers to discharge/monitor patient progress toward functional and medical goals.  Function:  Bathing Bathing position Bathing activity did not occur:  (pt had shower on day shift) Position: Shower  Bathing parts Body parts bathed by patient: Left arm, Chest, Abdomen, Front perineal area, Buttocks, Right upper leg, Left upper leg, Right lower leg, Left lower leg, Right arm, Back Body parts bathed by helper: Right arm  Bathing assist Assist Level: Supervision or verbal cues Assistive Device Comment: long-handled sponge    Upper Body Dressing/Undressing Upper body dressing   What is the patient wearing?: Pull over shirt/dress, Bra Bra - Perfomed by patient: Thread/unthread right bra strap, Thread/unthread left bra strap Bra - Perfomed by helper: Hook/unhook bra (pull down sports bra) Pull over shirt/dress - Perfomed by patient: Thread/unthread right sleeve, Thread/unthread left sleeve, Put head through opening Pull over shirt/dress - Perfomed by helper: Pull shirt over trunk        Upper body assist Assist Level: Touching or steadying assistance(Pt > 75%)      Lower Body Dressing/Undressing Lower body dressing   What is the patient wearing?: Pants, Underwear, Non-skid slipper socks Underwear - Performed by patient: Thread/unthread right underwear leg, Thread/unthread left underwear leg, Pull underwear up/down Underwear - Performed by helper: Pull underwear up/down Pants- Performed by patient: Thread/unthread right pants leg, Thread/unthread left pants leg, Pull pants up/down Pants-  Performed by helper: Pull pants up/down Non-skid slipper socks- Performed by patient: Don/doff right sock, Don/doff left sock Non-skid slipper socks- Performed by helper: Don/doff left sock, Don/doff right sock       Shoes - Performed by helper: Don/doff right shoe, Don/doff left shoe, Fasten right, Fasten left          Lower body assist Assist for lower body dressing:  Supervision or verbal cues      Toileting Toileting   Toileting steps completed by patient: Adjust clothing prior to toileting, Performs perineal hygiene, Adjust clothing after toileting Toileting steps completed by helper: Adjust clothing prior to toileting, Adjust clothing after toileting Toileting Assistive Devices: Grab bar or rail  Toileting assist Assist level: Supervision or verbal cues   Transfers Chair/bed transfer   Chair/bed transfer method: Ambulatory, Stand pivot Chair/bed transfer assist level: No Help, no cues, assistive device, takes more than a reasonable amount of time Chair/bed transfer assistive device: Armrests, Medical sales representative     Max distance: 250 ft Assist level: Supervision or verbal cues   Wheelchair   Type: Manual Max wheelchair distance: 100 Assist Level: Supervision or verbal cues  Cognition Comprehension Comprehension assist level: Follows complex conversation/direction with no assist  Expression Expression assist level: Expresses complex ideas: With no assist  Social Interaction Social Interaction assist level: Interacts appropriately with others with medication or extra time (anti-anxiety, antidepressant).  Problem Solving Problem solving assist level: Solves complex problems: Recognizes & self-corrects  Memory Memory assist level: Complete Independence: No helper     Medical Problem List and Plan:  1. 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  D/c today  Will see pt for transitional care management in 1-2 weeks  Bracing ordered  Fluoxetine started 1/12 2. DVT Prophylaxis/Anticoagulation: Pharmaceutical: Lovenox  3. Back pain/RLE neuropathy/Pain Management: improved control  Neurontin increased to 300 TID on 1/14  Robaxin PRN started 1/12  CT reviewed from 2015 showing L4-5 post-surgical changes, L3-4 stable impingement.   L-spine Xrays 1/15 reviewed, showing stable post-surgical  changes L4-5  Lidoderm patches x2 started 1/16 with benefit  Kpad 4. Mood: team to provide ego support. LCSW to follow for evaluation and support.  5. Neuropsych: This patient is capable of making decisions on her own behalf.  6. Skin/Wound Care: routine pressure relief measures.  7. Fluids/Electrolytes/Nutrition: Monitor I/O.   Dysphag 3 thin, advance as tolerated.  BMP WNL 1/18, no clinical signs of dehydration 8. [id number] BP bid  Controlled 1/25 9. Urinary retention: Improving  UA neg, Ucx NG.   Scheduled toileting--monitor voiding with PVR neg.  10. Chronic constipation:   Uses suppository every other day PTA.   Senna S increased to BID  Miralax increased to BID  Milk of Mag/Dulcolax x1 on 1/23, again 1/24  Suppository daily as needed.  11. Hypokalemia : Likely dilutional-- supplemented 1/11.   WNL on 1/25 12. Dyslipidemia: On zocor  13. Tobacco abuse: Nicotine patch. Encourage cessation.  14. H/o Gout:   Colchicine started 1/17 15. Right knee pain  Voltaren gel ordered 1/17  Cont Robaxin PRN  Improved     LOS (Days) 14 A FACE TO FACE EVALUATION WAS PERFORMED  Ankit Lorie Phenix 12/31/2016 12:56 PM

## 2017-01-01 ENCOUNTER — Telehealth: Payer: Self-pay

## 2017-01-01 DIAGNOSIS — I639 Cerebral infarction, unspecified: Secondary | ICD-10-CM | POA: Diagnosis not present

## 2017-01-01 DIAGNOSIS — I10 Essential (primary) hypertension: Secondary | ICD-10-CM | POA: Diagnosis not present

## 2017-01-01 NOTE — Telephone Encounter (Signed)
Left voicemail message to have patient to return our phone call to discuss hospital follow-up and upcoming appointment schedule with Dr. Posey Pronto.

## 2017-01-02 DIAGNOSIS — I10 Essential (primary) hypertension: Secondary | ICD-10-CM | POA: Diagnosis not present

## 2017-01-02 DIAGNOSIS — I69354 Hemiplegia and hemiparesis following cerebral infarction affecting left non-dominant side: Secondary | ICD-10-CM | POA: Diagnosis not present

## 2017-01-02 DIAGNOSIS — M1990 Unspecified osteoarthritis, unspecified site: Secondary | ICD-10-CM | POA: Diagnosis not present

## 2017-01-02 DIAGNOSIS — E785 Hyperlipidemia, unspecified: Secondary | ICD-10-CM | POA: Diagnosis not present

## 2017-01-04 DIAGNOSIS — M1990 Unspecified osteoarthritis, unspecified site: Secondary | ICD-10-CM | POA: Diagnosis not present

## 2017-01-04 DIAGNOSIS — I69354 Hemiplegia and hemiparesis following cerebral infarction affecting left non-dominant side: Secondary | ICD-10-CM | POA: Diagnosis not present

## 2017-01-04 DIAGNOSIS — I10 Essential (primary) hypertension: Secondary | ICD-10-CM | POA: Diagnosis not present

## 2017-01-04 DIAGNOSIS — E785 Hyperlipidemia, unspecified: Secondary | ICD-10-CM | POA: Diagnosis not present

## 2017-01-05 NOTE — Telephone Encounter (Signed)
Appointment time Confirmed 1:40pm , arrive time 1:20pm Confirmed W/Dr. Posey Pronto [address]  No Paperwork mailed   Transitional Care call- Rob Bunting (daughter)  1. Are you/is patient experiencing any problems since coming home? No Are there any questions regarding any aspect of care? No 2. Are there any questions regarding medications administration/dosing? No Are meds being taken as prescribed? No Patient should review meds with caller to confirm 3. Have there been any falls? No 4. Has Home Health been to the house and/or have they contacted you? Yes,  5. Are bowels and bladder emptying properly? Yes  6. Any fevers, problems with breathing, unexpected pain? No 7. Are there any skin problems or new areas of breakdown? No 8. Has the patient/family member arranged specialty MD follow up (ie cardiology/neurology/renal/surgical/etc)? Yes   9. Does the patient need any other services or support that we can help arrange? Not at this time 10. Are caregivers following through as expected in assisting the patient? Yes 11. Has the patient quit smoking, drinking alcohol, or using drugs as recommended? Patient doesn't use tobacco products or alcohol

## 2017-01-06 DIAGNOSIS — I10 Essential (primary) hypertension: Secondary | ICD-10-CM | POA: Diagnosis not present

## 2017-01-06 DIAGNOSIS — I69354 Hemiplegia and hemiparesis following cerebral infarction affecting left non-dominant side: Secondary | ICD-10-CM | POA: Diagnosis not present

## 2017-01-06 DIAGNOSIS — E785 Hyperlipidemia, unspecified: Secondary | ICD-10-CM | POA: Diagnosis not present

## 2017-01-06 DIAGNOSIS — M1990 Unspecified osteoarthritis, unspecified site: Secondary | ICD-10-CM | POA: Diagnosis not present

## 2017-01-07 ENCOUNTER — Encounter: Payer: Self-pay | Admitting: Physical Medicine & Rehabilitation

## 2017-01-07 ENCOUNTER — Encounter: Payer: Medicare Other | Attending: Physical Medicine & Rehabilitation | Admitting: Physical Medicine & Rehabilitation

## 2017-01-07 VITALS — BP 103/74 | HR 101 | Resp 14

## 2017-01-07 DIAGNOSIS — R350 Frequency of micturition: Secondary | ICD-10-CM | POA: Diagnosis not present

## 2017-01-07 DIAGNOSIS — G629 Polyneuropathy, unspecified: Secondary | ICD-10-CM | POA: Diagnosis not present

## 2017-01-07 DIAGNOSIS — I69354 Hemiplegia and hemiparesis following cerebral infarction affecting left non-dominant side: Secondary | ICD-10-CM | POA: Diagnosis not present

## 2017-01-07 DIAGNOSIS — G4733 Obstructive sleep apnea (adult) (pediatric): Secondary | ICD-10-CM | POA: Diagnosis not present

## 2017-01-07 DIAGNOSIS — I639 Cerebral infarction, unspecified: Secondary | ICD-10-CM | POA: Diagnosis not present

## 2017-01-07 DIAGNOSIS — K5909 Other constipation: Secondary | ICD-10-CM | POA: Insufficient documentation

## 2017-01-07 DIAGNOSIS — R269 Unspecified abnormalities of gait and mobility: Secondary | ICD-10-CM | POA: Insufficient documentation

## 2017-01-07 DIAGNOSIS — F17210 Nicotine dependence, cigarettes, uncomplicated: Secondary | ICD-10-CM | POA: Insufficient documentation

## 2017-01-07 DIAGNOSIS — G8929 Other chronic pain: Secondary | ICD-10-CM | POA: Diagnosis not present

## 2017-01-07 DIAGNOSIS — Z720 Tobacco use: Secondary | ICD-10-CM

## 2017-01-07 DIAGNOSIS — M792 Neuralgia and neuritis, unspecified: Secondary | ICD-10-CM

## 2017-01-07 DIAGNOSIS — I10 Essential (primary) hypertension: Secondary | ICD-10-CM | POA: Insufficient documentation

## 2017-01-07 DIAGNOSIS — K5901 Slow transit constipation: Secondary | ICD-10-CM

## 2017-01-07 DIAGNOSIS — G8114 Spastic hemiplegia affecting left nondominant side: Secondary | ICD-10-CM | POA: Diagnosis not present

## 2017-01-07 DIAGNOSIS — M549 Dorsalgia, unspecified: Secondary | ICD-10-CM | POA: Insufficient documentation

## 2017-01-07 MED ORDER — FLUOXETINE HCL 20 MG PO CAPS
20.0000 mg | ORAL_CAPSULE | Freq: Every day | ORAL | 1 refills | Status: DC
Start: 2017-01-07 — End: 2018-01-19

## 2017-01-07 MED ORDER — GABAPENTIN 600 MG PO TABS: 600.0000 mg | ORAL_TABLET | Freq: Three times a day (TID) | ORAL | 1 refills | Status: DC

## 2017-01-07 NOTE — Progress Notes (Signed)
Subjective:    Patient ID: Ellen Henry, female    DOB: Jul 27, 1946, 71 y.o.   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   Admit date: 12/17/2016 Discharge date: 12/31/2016  At discharge, pt was instructed she needs supervision, which she has.  She is not driving.  She has an appointment with Neurology. She sees PCP in a couple of weeks. She continues to take her meds. The Gabapentin is working for her as well as the Robaxin.  The lidoderm patches are not sticking.  She is now on a regular diet without difficulty. Blood pressure is well controlled. She has to urinate every few hours.  Her bowel movement have improved.  She is still on the nicotine patch.  Her gout is under control.  Her right leg is causing her some pain.  Since she left, she states she is doing great, but does note some generalized weakness.   Therapies: 2/week PT, 2/week OT Mobility: Rolling walker at all times DME: Wheelchair  Pain Inventory Average Pain 7 Pain Right Now 5 My pain is dull and aching  In the last 24 hours, has pain interfered with the following? General activity 4 Relation with others 4 Enjoyment of life 4 What TIME of day is your pain at its worst? night Sleep (in general) Fair  Pain is worse with: no selection Pain improves with: no selection Relief from Meds: no selection  Mobility walk with assistance use a walker how many minutes can you walk? 15 ability to climb steps?  yes Do you have any goals in this area?  yes  Function retired  Neuro/Psych weakness spasms anxiety  Prior Studies hospital f/u  Physicians involved in your care hospital f/u   Family History  Problem Relation Age of Onset  . Varicose Veins Mother    Social History   Social History  . Marital status:  Widowed    Spouse name: N/A  . Number of children: N/A  . Years of education: N/A   Social History Main Topics  . Smoking status: Current Every Day Smoker    Packs/day: 0.25    Types: Cigarettes  . Smokeless tobacco: Never Used  . Alcohol use No  . Drug use: No  . Sexual activity: No   Other Topics Concern  . None   Social History Narrative   ** Merged History Encounter **       Past Surgical History:  Procedure Laterality Date  . ABDOMINAL EXPOSURE N/A 08/29/2014   Procedure: ABDOMINAL EXPOSURE;  Surgeon: Rosetta Posner, MD;  Location: [place];  Service: Vascular;  Laterality: N/A;  . ABDOMINAL HYSTERECTOMY    . ANTERIOR LUMBAR FUSION N/A 08/29/2014   Procedure: ANTERIOR LUMBAR FUSION 1 LEVEL;  Surgeon: Sinclair Ship, MD;  Location: [facility];  Service: Orthopedics;  Laterality: N/A;  Lumbar 4-5 anterior lumbar interbody fusion with allograft and instrumentation.  . APPENDECTOMY    . BACK SURGERY  08/28/2014 and 08/29/2014   lumbar fusion  . CARPAL TUNNEL RELEASE Right    release done twice  . COLONOSCOPY    . JOINT REPLACEMENT    . REPLACEMENT TOTAL KNEE  2010   rigth knee  . TONSILLECTOMY    . TOTAL KNEE ARTHROPLASTY Left 02/15/2015   Procedure: TOTAL KNEE ARTHROPLASTY;  Surgeon: [name],  MD;  Location: [facility];  Service: Orthopedics;  Laterality: Left;   Past Medical History:  Diagnosis Date  . Arthritis   . Dry skin   . GERD (gastroesophageal reflux disease)    "years ago", diet controlled  . Gout   . H/O pyelonephritis    as a child  . 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 Hypertension   . Neuropathy ([place])   . Pneumonia   . Radiculopathy   . Sleep apnea    does not use cpap   BP 103/74   Pulse (!) 101   Resp 14   SpO2 96%   Opioid Risk Score:   Fall Risk Score:  `1  Depression screen PHQ 2/9  Depression screen PHQ 2/9 01/07/2017  Decreased Interest 2  Down, Depressed, Hopeless 1  PHQ - 2 Score 3  Altered sleeping 0  Tired,  decreased energy 2  Change in appetite 2  Feeling bad or failure about yourself  1  Trouble concentrating 1  Moving slowly or fidgety/restless 1  Suicidal thoughts 0  PHQ-9 Score 10  Difficult doing work/chores Somewhat difficult    Review of Systems  Constitutional: Positive for chills, diaphoresis and fever.  HENT: Negative.   Eyes: Negative.   Respiratory: Positive for apnea, cough and shortness of breath.   Cardiovascular: Negative.   Gastrointestinal: Positive for constipation.  Endocrine: Negative.        High blood sugar  Genitourinary: Negative.   Musculoskeletal: Positive for back pain and gait problem.  Skin: Negative.   Allergic/Immunologic: Negative.   Neurological: Positive for weakness.  Hematological: Negative.   Psychiatric/Behavioral: The patient is nervous/anxious.   All other systems reviewed and are negative.      Objective:   Physical Exam Constitutional: She appears well-developed and well-nourished.  HENT: Normocephalic and atraumatic.  Eyes: EOMI. No discharge.  Cardiovascular: RRR. No JVD. Respiratory: clear bilaterally. Unlabored.  GI: Soft. Bowel sounds are normal.  Musculoskeletal: She exhibits no edema or tenderness.  Neurological: She is alert and oriented.  Left facial droop with mild dysarthria, improving.  Motor: LUE: deltoid 4-/5 proximal to distal. mAS 1+/4 elbow flexors LLE: 4-/5 HF, 4/5 KE, ADF/DP 4+/5  Skin: Skin is warm and dry. Intact.     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   1. Left hemiparesis secondary to right post limb internal capsule infarct on 12/15/15.  Follow up Neurology  Cont meds  Cont therapies  Emerging tone  Will increase Fluoxetine to 20mg    2. Neuropathic pain              Neurontin increased to 600 TID   Cont  Robaxin PRN   3. Urinary freq  Controlled with times voiding  Will consider meds in future  4. Spastic hemiparesis left  Pt does not want medications at present,w ill consider in future  Cont ROM  5. Chronic constipation:   Cont meds as needed  6. Tobacco abuse  Cont Nicotine patch.   Encourage cessation.   7. Gait abnormality  Cont walker  Cont therapies  Meds reviewed Referrals reviewed All questions answered

## 2017-01-08 ENCOUNTER — Telehealth: Payer: Self-pay | Admitting: *Deleted

## 2017-01-08 DIAGNOSIS — I69354 Hemiplegia and hemiparesis following cerebral infarction affecting left non-dominant side: Secondary | ICD-10-CM | POA: Diagnosis not present

## 2017-01-08 DIAGNOSIS — I10 Essential (primary) hypertension: Secondary | ICD-10-CM | POA: Diagnosis not present

## 2017-01-08 DIAGNOSIS — M1990 Unspecified osteoarthritis, unspecified site: Secondary | ICD-10-CM | POA: Diagnosis not present

## 2017-01-08 DIAGNOSIS — E785 Hyperlipidemia, unspecified: Secondary | ICD-10-CM | POA: Diagnosis not present

## 2017-01-08 NOTE — Telephone Encounter (Signed)
Prior authorization for lidocaine patches submitted

## 2017-01-11 DIAGNOSIS — I69354 Hemiplegia and hemiparesis following cerebral infarction affecting left non-dominant side: Secondary | ICD-10-CM | POA: Diagnosis not present

## 2017-01-11 DIAGNOSIS — E785 Hyperlipidemia, unspecified: Secondary | ICD-10-CM | POA: Diagnosis not present

## 2017-01-11 DIAGNOSIS — I10 Essential (primary) hypertension: Secondary | ICD-10-CM | POA: Diagnosis not present

## 2017-01-11 DIAGNOSIS — M1990 Unspecified osteoarthritis, unspecified site: Secondary | ICD-10-CM | POA: Diagnosis not present

## 2017-01-14 DIAGNOSIS — I69354 Hemiplegia and hemiparesis following cerebral infarction affecting left non-dominant side: Secondary | ICD-10-CM | POA: Diagnosis not present

## 2017-01-14 DIAGNOSIS — E785 Hyperlipidemia, unspecified: Secondary | ICD-10-CM | POA: Diagnosis not present

## 2017-01-14 DIAGNOSIS — I10 Essential (primary) hypertension: Secondary | ICD-10-CM | POA: Diagnosis not present

## 2017-01-14 DIAGNOSIS — M1990 Unspecified osteoarthritis, unspecified site: Secondary | ICD-10-CM | POA: Diagnosis not present

## 2017-01-18 ENCOUNTER — Telehealth: Payer: Self-pay

## 2017-01-18 DIAGNOSIS — I69354 Hemiplegia and hemiparesis following cerebral infarction affecting left non-dominant side: Secondary | ICD-10-CM | POA: Diagnosis not present

## 2017-01-18 DIAGNOSIS — M1990 Unspecified osteoarthritis, unspecified site: Secondary | ICD-10-CM | POA: Diagnosis not present

## 2017-01-18 DIAGNOSIS — E785 Hyperlipidemia, unspecified: Secondary | ICD-10-CM | POA: Diagnosis not present

## 2017-01-18 DIAGNOSIS — I10 Essential (primary) hypertension: Secondary | ICD-10-CM | POA: Diagnosis not present

## 2017-01-18 NOTE — Telephone Encounter (Signed)
Called for extension in OT for home health care, extension was granted and noted on patients chart.

## 2017-01-19 DIAGNOSIS — Z8673 Personal history of transient ischemic attack (TIA), and cerebral infarction without residual deficits: Secondary | ICD-10-CM | POA: Diagnosis not present

## 2017-01-19 DIAGNOSIS — I10 Essential (primary) hypertension: Secondary | ICD-10-CM | POA: Diagnosis not present

## 2017-01-21 DIAGNOSIS — M1990 Unspecified osteoarthritis, unspecified site: Secondary | ICD-10-CM | POA: Diagnosis not present

## 2017-01-21 DIAGNOSIS — E785 Hyperlipidemia, unspecified: Secondary | ICD-10-CM | POA: Diagnosis not present

## 2017-01-21 DIAGNOSIS — G609 Hereditary and idiopathic neuropathy, unspecified: Secondary | ICD-10-CM | POA: Diagnosis not present

## 2017-01-21 DIAGNOSIS — I10 Essential (primary) hypertension: Secondary | ICD-10-CM | POA: Diagnosis not present

## 2017-01-21 DIAGNOSIS — I69354 Hemiplegia and hemiparesis following cerebral infarction affecting left non-dominant side: Secondary | ICD-10-CM | POA: Diagnosis not present

## 2017-01-25 DIAGNOSIS — M1990 Unspecified osteoarthritis, unspecified site: Secondary | ICD-10-CM | POA: Diagnosis not present

## 2017-01-25 DIAGNOSIS — I10 Essential (primary) hypertension: Secondary | ICD-10-CM | POA: Diagnosis not present

## 2017-01-25 DIAGNOSIS — I69354 Hemiplegia and hemiparesis following cerebral infarction affecting left non-dominant side: Secondary | ICD-10-CM | POA: Diagnosis not present

## 2017-01-25 DIAGNOSIS — E785 Hyperlipidemia, unspecified: Secondary | ICD-10-CM | POA: Diagnosis not present

## 2017-01-27 DIAGNOSIS — E785 Hyperlipidemia, unspecified: Secondary | ICD-10-CM | POA: Diagnosis not present

## 2017-01-27 DIAGNOSIS — I10 Essential (primary) hypertension: Secondary | ICD-10-CM | POA: Diagnosis not present

## 2017-01-27 DIAGNOSIS — M1990 Unspecified osteoarthritis, unspecified site: Secondary | ICD-10-CM | POA: Diagnosis not present

## 2017-01-27 DIAGNOSIS — I69354 Hemiplegia and hemiparesis following cerebral infarction affecting left non-dominant side: Secondary | ICD-10-CM | POA: Diagnosis not present

## 2017-02-01 DIAGNOSIS — I69354 Hemiplegia and hemiparesis following cerebral infarction affecting left non-dominant side: Secondary | ICD-10-CM | POA: Diagnosis not present

## 2017-02-01 DIAGNOSIS — E785 Hyperlipidemia, unspecified: Secondary | ICD-10-CM | POA: Diagnosis not present

## 2017-02-01 DIAGNOSIS — M1990 Unspecified osteoarthritis, unspecified site: Secondary | ICD-10-CM | POA: Diagnosis not present

## 2017-02-01 DIAGNOSIS — I10 Essential (primary) hypertension: Secondary | ICD-10-CM | POA: Diagnosis not present

## 2017-02-03 DIAGNOSIS — I10 Essential (primary) hypertension: Secondary | ICD-10-CM | POA: Diagnosis not present

## 2017-02-03 DIAGNOSIS — M1990 Unspecified osteoarthritis, unspecified site: Secondary | ICD-10-CM | POA: Diagnosis not present

## 2017-02-03 DIAGNOSIS — I69354 Hemiplegia and hemiparesis following cerebral infarction affecting left non-dominant side: Secondary | ICD-10-CM | POA: Diagnosis not present

## 2017-02-03 DIAGNOSIS — E785 Hyperlipidemia, unspecified: Secondary | ICD-10-CM | POA: Diagnosis not present

## 2017-02-04 ENCOUNTER — Encounter: Payer: Medicare Other | Admitting: Physical Medicine & Rehabilitation

## 2017-02-05 NOTE — Progress Notes (Signed)
Pt rescheduled

## 2017-02-18 ENCOUNTER — Encounter: Payer: Self-pay | Admitting: Neurology

## 2017-02-18 ENCOUNTER — Telehealth: Payer: Self-pay | Admitting: Neurology

## 2017-02-18 ENCOUNTER — Ambulatory Visit (INDEPENDENT_AMBULATORY_CARE_PROVIDER_SITE_OTHER): Payer: Medicare Other | Admitting: Neurology

## 2017-02-18 VITALS — BP 146/87 | HR 76 | Ht 60.0 in | Wt 229.4 lb

## 2017-02-18 DIAGNOSIS — I639 Cerebral infarction, unspecified: Secondary | ICD-10-CM | POA: Diagnosis not present

## 2017-02-18 DIAGNOSIS — I6381 Other cerebral infarction due to occlusion or stenosis of small artery: Secondary | ICD-10-CM

## 2017-02-18 MED ORDER — ATORVASTATIN CALCIUM 40 MG PO TABS: 40.0000 mg | ORAL_TABLET | Freq: Every day | ORAL | 0 refills | Status: DC

## 2017-02-18 NOTE — Telephone Encounter (Signed)
Medication list update for patient added telmisartan to list. Medication was prescribed by patients PCP.

## 2017-02-18 NOTE — Progress Notes (Signed)
[facility] [address] [place]. [place] [address] [phone]       OFFICE FOLLOW-UP NOTE  Ms. Ellen Henry Date of Birth:  [birth year] Medical Record Number:  [id number]   HPI: Ellen Henry is a 71 year old African-American lady seen today for first office follow-up visit following hospital admission for stroke in January 2018. She is accompanied by her daughter. History is obtained from the patient, daughter, review of Hospital medical records and I have personally reviewed imaging films.Ellen Goehring McKinleyis an 71 y.o.femalewho presented for acute onset of stuttering TIA symptoms starting at 3 PM on 12/15/2016. She was at home in usual state of health when she suddenly became diffusely weak. She denied that she had lateralized weakness. The diffuse weakness made it difficult for her to get back into bed to rest. Her symptoms resolved but then recurred; she called EMS who noted left facial droop and left facial weakness, which had resolved per ED staff at time of presentation to [facility]. NIHSS:1.  Patient was not administered IV t-PA secondary to  being out of IV tPA time window. Dr Cheral Marker neurohospitalist ion call was called back to ED for reactivation of Code Stroke at 22:45. Nurse was performing swallow screen, the patient started yawning, eyes started watering, left arm became flaccid and left facial droop was noted. LLE also with recurrent weakness. She was found to have grade 2/5 strength in the left upper extremity and 4/5 in the right. Code stroke was reactivated and a stat CT scan and CT angiogram and perfusion scan was obtained which showed no acute abnormality on any large vessel occlusion and measure perfusion deficit. Subsequently Namenda was obtained which confirmed a small right internal capsule infarct. Transthoracic echo showed normal ejection fraction. LDL cholesterol is elevated at 147 and hemoglobin A1c was normal at 5.4. Patient was started on aspirin for stroke  prevention and seen by physical occupational therapy. She showed improvement and was transferred to inpatient rehabilitation for ongoing therapy needs. She'll discharged from there and is currently at home and getting home physical and occupational therapy. She is regaining a lot of strength on the left but still has significant weakness of left grip as well as left leg] left leg. She is able to walk with a walker but has to walk slowly. She is on no falls or injuries. Her fine motor skills appear to be improving. She is tolerating aspirin without bleeding or bruising. Her blood pressure is well controlled though today it is slightly elevated at 146/87. She ran out of her Lipitor a few weeks ago and has not filled the prescription. She is complaining of right shoulder muscular pain with tenderness to touch. She's been taking Tylenol which helps. She has not yet discussed this with her primary physician. She started watching her diet and she wants to be active. She wants to start driving again. She has quit smoking ROS:   14 system review of systems is positive for  fatigue, and drooling, cough, shortness of breath, leg swelling, cold intolerance, constipation, restless leg, insomnia, apnea, frequent waking, snoring, frequency of urination, joint and back pain, aching muscles, walking difficulty, headache, numbness, weakness, tremors, agitation, anxiety and all other systems negative PMH:  Past Medical History:  Diagnosis Date  . Arthritis   . Dry skin   . GERD (gastroesophageal reflux disease)    "years ago", diet controlled  . Gout   . H/O pyelonephritis    as a child  . Heart murmur  since birth, denies any problems   . Hypertension   . Neuropathy ([place])   . Pneumonia   . Radiculopathy   . Sleep apnea    does not use cpap  . Stroke [facility])     Social History:  Social History   Social History  . Marital status: Widowed    Spouse name: N/A  . Number of children: N/A  . Years of  education: N/A   Occupational History  . Not on file.   Social History Main Topics  . Smoking status: Former Smoker    Packs/day: 0.25    Types: Cigarettes    Quit date: 12/14/2016  . Smokeless tobacco: Never Used     Comment: use a patch  . Alcohol use No  . Drug use: No  . Sexual activity: No   Other Topics Concern  . Not on file   Social History Narrative   ** Merged History Encounter **        Medications:   Current Outpatient Prescriptions on File Prior to Visit  Medication Sig Dispense Refill  . acetaminophen (TYLENOL) 325 MG tablet Take 1-2 tablets (325-650 mg total) by mouth every 6 (six) hours as needed for mild pain.    Marland Kitchen aspirin EC 325 MG EC tablet Take 1 tablet (325 mg total) by mouth daily. 30 tablet 0  . colchicine 0.6 MG tablet Take 1 tablet (0.6 mg total) by mouth daily. (Patient taking differently: Take 0.6 mg by mouth daily as needed (pain). ) 1 tablet 0  . diclofenac sodium (VOLTAREN) 1 % GEL Apply 2 g topically 4 (four) times daily. 4 Tube 0  . FLUoxetine (PROZAC) 20 MG capsule Take 1 capsule (20 mg total) by mouth daily. 90 capsule 1  . gabapentin (NEURONTIN) 600 MG tablet Take 1 tablet (600 mg total) by mouth 3 (three) times daily. 90 tablet 1  . lidocaine (LIDODERM) 5 % Apply at 6 am and remove at 6 pm daily. 30 patch 0  . nicotine (NICODERM CQ - DOSED IN MG/24 HOURS) 14 mg/24hr patch Place 102 patches (1,430 mg total) onto the skin daily. 30 patch 0   No current facility-administered medications on file prior to visit.     Allergies:   Allergies  Allergen Reactions  . Food Allergy Formula Other (See Comments)    Shellfish-banana-beef-flares up her gout    Physical Exam General:Obese elderly African-American lady, seated, in no evident distress Head: head normocephalic and atraumatic.  Neck: supple with no carotid or supraclavicular bruits Cardiovascular: regular rate and rhythm, no murmurs Musculoskeletal: no deformity. Slight tenderness right  deltoid Skin:  no rash/petichiae Vascular:  Normal pulses all extremities Vitals:   02/18/17 0831  BP: (!) 146/87  Pulse: 76   Neurologic Exam Mental Status: Awake and fully alert. Oriented to place and time. Recent and remote memory intact. Attention span, concentration and fund of knowledge appropriate. Mood and affect appropriate.  Cranial Nerves: Fundoscopic exam reveals sharp disc margins. Pupils equal, briskly reactive to light. Extraocular movements full without nystagmus. Visual fields full to confrontation. Hearing intact. Facial sensation intact. Mild left lower facial asymmetry when she smiles., Tongue, palate moves normally and symmetrically.  Motor: Normal bulk and increased tone in the left leg. Mild weakness of left grip, intrinsic hand muscles, left ankle dorsiflexors. Orbits right over left upper extremity.. Sensory.: intact to touch ,pinprick .position and vibratory sensation.  Coordination: Rapid alternating movements normal in all extremities. Finger-to-nose and heel-to-shin Impaired on the left side  Gait and Station: Arises from chair without difficulty. Stance is normal. Gait demonstrates normal stride length and balance . Able to heel, toe and tandem walk without difficulty.  Reflexes: 1+ and symmetric. Toes downgoing.   NIHSS  3 Modified Rankin  3  ASSESSMENT: 61 year african [place] lady with right internal capsule Infarct in January 2018 secondary to small vessel disease with fluctuating presentation. Vascular risk factors of hypertension, hyperlipidemia, smoking and obesity. Musculoskeletal right shoulder pain.    PLAN: I had a long d/w patient and her daughter about her  recent lacunar stroke, risk for recurrent stroke/TIAs, personally independently reviewed imaging studies and stroke evaluation results and answered questions.Continue aspirin 325 mg daily  for secondary stroke prevention and maintain strict control of hypertension with blood pressure goal below  130/90, diabetes with hemoglobin A1c goal below 6.5% and lipids with LDL cholesterol goal below 70 mg/dL. I also advised the patient to eat a healthy diet with plenty of whole grains, cereals, fruits and vegetables, exercise regularly and maintain ideal body weight. I have given her refill for her cholesterol medication for a month and advised her to follow-up in the future with primary care physician for the same Recommend outpatient physical and occupational therapy to continue once her home therapy ends. I advised her to use a cane or walker at all times for fall and safety precautions. Followup in the future with my nurse practitioner in 6 months or call earlier if necessary. I recommend she follow-up with her primary care physician for muscular right shoulder pain Greater than 50% of time during this 25 minute visit was spent on counseling,explanation of diagnosis of lacunar stroke, planning of further management, discussion with patient and family and coordination of care Antony Contras, MD  [facility] [address] [place] [address]  Phone [phone] Fax [phone] Note: This document was prepared with digital dictation and possible smart phrase technology. Any transcriptional errors that result from this process are unintentional

## 2017-02-18 NOTE — Patient Instructions (Addendum)
I had a long d/w patient and her daughter about her  recent lacunar stroke, risk for recurrent stroke/TIAs, personally independently reviewed imaging studies and stroke evaluation results and answered questions.Continue aspirin 325 mg daily  for secondary stroke prevention and maintain strict control of hypertension with blood pressure goal below 130/90, diabetes with hemoglobin A1c goal below 6.5% and lipids with LDL cholesterol goal below 70 mg/dL. I also advised the patient to eat a healthy diet with plenty of whole grains, cereals, fruits and vegetables, exercise regularly and maintain ideal body weight. I have given her refill for her cholesterol medication for a month and advised her to follow-up in the future with primary care physician for the same Recommend outpatient physical and occupational therapy to continue once her home therapy ends. I advised her to use a cane or walker at all times for fall and safety precautions. Followup in the future with my nurse practitioner in 6 months or call earlier if necessary. Stroke Prevention Some medical conditions and behaviors are associated with an increased chance of having a stroke. You may prevent a stroke by making healthy choices and managing medical conditions. How can I reduce my risk of having a stroke?  Stay physically active. Get at least 30 minutes of activity on most or all days.  Do not smoke. It may also be helpful to avoid exposure to secondhand smoke.  Limit alcohol use. Moderate alcohol use is considered to be:  No more than 2 drinks per day for men.  No more than 1 drink per day for nonpregnant women.  Eat healthy foods. This involves:  Eating 5 or more servings of fruits and vegetables a day.  Making dietary changes that address high blood pressure (hypertension), high cholesterol, diabetes, or obesity.  Manage your cholesterol levels.  Making food choices that are high in fiber and low in saturated fat, trans fat, and  cholesterol may control cholesterol levels.  Take any prescribed medicines to control cholesterol as directed by your health care provider.  Manage your diabetes.  Controlling your carbohydrate and sugar intake is recommended to manage diabetes.  Take any prescribed medicines to control diabetes as directed by your health care provider.  Control your hypertension.  Making food choices that are low in salt (sodium), saturated fat, trans fat, and cholesterol is recommended to manage hypertension.  Ask your health care provider if you need treatment to lower your blood pressure. Take any prescribed medicines to control hypertension as directed by your health care provider.  If you are 31-44 years of age, have your blood pressure checked every 3-5 years. If you are 33 years of age or older, have your blood pressure checked every year.  Maintain a healthy weight.  Reducing calorie intake and making food choices that are low in sodium, saturated fat, trans fat, and cholesterol are recommended to manage weight.  Stop drug abuse.  Avoid taking birth control pills.  Talk to your health care provider about the risks of taking birth control pills if you are over 66 years old, smoke, get migraines, or have ever had a blood clot.  Get evaluated for sleep disorders (sleep apnea).  Talk to your health care provider about getting a sleep evaluation if you snore a lot or have excessive sleepiness.  Take medicines only as directed by your health care provider.  For some people, aspirin or blood thinners (anticoagulants) are helpful in reducing the risk of forming abnormal blood clots that can lead to stroke. If  you have the irregular heart rhythm of atrial fibrillation, you should be on a blood thinner unless there is a good reason you cannot take them.  Understand all your medicine instructions.  Make sure that other conditions (such as anemia or atherosclerosis) are addressed. Get help right  away if:  You have sudden weakness or numbness of the face, arm, or leg, especially on one side of the body.  Your face or eyelid droops to one side.  You have sudden confusion.  You have trouble speaking (aphasia) or understanding.  You have sudden trouble seeing in one or both eyes.  You have sudden trouble walking.  You have dizziness.  You have a loss of balance or coordination.  You have a sudden, severe headache with no known cause.  You have new chest pain or an irregular heartbeat. Any of these symptoms may represent a serious problem that is an emergency. Do not wait to see if the symptoms will go away. Get medical help at once. Call your local emergency services (911 in [place] Do not drive yourself to the hospital. This information is not intended to replace advice given to you by your health care provider. Make sure you discuss any questions you have with your health care provider. Document Released: 12/31/2004 Document Revised: 04/30/2016 Document Reviewed: 05/26/2013 Elsevier Interactive Patient Education  2017 [employer].

## 2017-02-18 NOTE — Telephone Encounter (Signed)
Pt called to inform the blood pressure medication she is on is Telmisartan 40-12.5mg  1 a day

## 2017-02-19 ENCOUNTER — Encounter: Payer: Medicare Other | Admitting: Physical Medicine & Rehabilitation

## 2017-03-01 ENCOUNTER — Ambulatory Visit: Payer: Medicare Other | Admitting: Occupational Therapy

## 2017-03-01 ENCOUNTER — Encounter: Payer: Self-pay | Admitting: Occupational Therapy

## 2017-03-01 ENCOUNTER — Ambulatory Visit: Payer: Medicare Other | Attending: Neurology | Admitting: Rehabilitative and Restorative Service Providers"

## 2017-03-01 VITALS — BP 98/69 | HR 88

## 2017-03-01 DIAGNOSIS — R29818 Other symptoms and signs involving the nervous system: Secondary | ICD-10-CM | POA: Diagnosis not present

## 2017-03-01 DIAGNOSIS — R2689 Other abnormalities of gait and mobility: Secondary | ICD-10-CM | POA: Diagnosis not present

## 2017-03-01 DIAGNOSIS — M6281 Muscle weakness (generalized): Secondary | ICD-10-CM | POA: Diagnosis not present

## 2017-03-01 DIAGNOSIS — R278 Other lack of coordination: Secondary | ICD-10-CM | POA: Insufficient documentation

## 2017-03-01 DIAGNOSIS — R2681 Unsteadiness on feet: Secondary | ICD-10-CM | POA: Diagnosis not present

## 2017-03-01 DIAGNOSIS — R293 Abnormal posture: Secondary | ICD-10-CM | POA: Insufficient documentation

## 2017-03-01 DIAGNOSIS — I69354 Hemiplegia and hemiparesis following cerebral infarction affecting left non-dominant side: Secondary | ICD-10-CM | POA: Insufficient documentation

## 2017-03-01 NOTE — Therapy (Signed)
[facility] [address] [place], [place], [address] Phone: [phone]   Fax:  [phone]  Physical Therapy Evaluation  Patient Details  Name: Ellen Henry MRN: [id number] Date of Birth: 07-06-1946 Referring Provider: Antony Contras, MD  Encounter Date: 03/01/2017      PT End of Session - 03/01/17 1454    Visit Number 1   Number of Visits 16   Date for PT Re-Evaluation 04/30/17   Authorization Type G code every 10th visit   PT Start Time 1020   PT Stop Time 1100   PT Time Calculation (min) 40 min   Equipment Utilized During Treatment Gait belt   Activity Tolerance Patient tolerated treatment well   Behavior During Therapy [facility] for tasks assessed/performed      Past Medical History:  Diagnosis Date  . Arthritis   . Dry skin   . GERD (gastroesophageal reflux disease)    "years ago", diet controlled  . Gout   . H/O pyelonephritis    as a child  . 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 Hypertension   . Neuropathy ([place])   . Pneumonia   . Radiculopathy   . Sleep apnea    does not use cpap  . Stroke [facility])     Past Surgical History:  Procedure Laterality Date  . ABDOMINAL EXPOSURE N/A 08/29/2014   Procedure: ABDOMINAL EXPOSURE;  Surgeon: Rosetta Posner, MD;  Location: [place];  Service: Vascular;  Laterality: N/A;  . ABDOMINAL HYSTERECTOMY    . ANTERIOR LUMBAR FUSION N/A 08/29/2014   Procedure: ANTERIOR LUMBAR FUSION 1 LEVEL;  Surgeon: Sinclair Ship, MD;  Location: [facility];  Service: Orthopedics;  Laterality: N/A;  Lumbar 4-5 anterior lumbar interbody fusion with allograft and instrumentation.  . APPENDECTOMY    . BACK SURGERY  08/28/2014 and 08/29/2014   lumbar fusion  . CARPAL TUNNEL RELEASE Right    release done twice  . COLONOSCOPY    . JOINT REPLACEMENT    . left knee replacement    . REPLACEMENT TOTAL KNEE  2010   rigth knee  . TONSILLECTOMY    . TOTAL KNEE ARTHROPLASTY Left  02/15/2015   Procedure: TOTAL KNEE ARTHROPLASTY;  Surgeon: [name], MD;  Location: [place];  Service: Orthopedics;  Laterality: Left;    There were no vitals filed for this visit.       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  She was admitted to IP rehab until 12/31/16.  She then had home health therapy and d/c from [facility] recently.  She presents to OP therapy today.  Current functional status:  She is not using RW regularly, has returned to ADLs independently.  She uses RW in community for support.  She notes 3 falls since d/c from the hospital (1- getting into bed, 2-up steps, 3-turning quickly).    Patient Stated Goals "Be able to use my left side withut falling, getting off of the walker, gaining strength."   Currently in Pain? Yes   Pain Score 5    Pain Location Back   Pain Orientation Lower;Mid   Pain Descriptors / Indicators Aching   Pain Type Chronic pain   Pain Onset More than a month ago   Pain Frequency Constant   Aggravating Factors  walking or standing aggravate, tires easily   Pain Relieving Factors pain patch, rest, heat   Multiple  Pain Sites Yes   Pain Score 5   Pain Location Knee   Pain Orientation Left   Pain Descriptors / Indicators Aching   Pain Type Chronic pain   Pain Onset More than a month ago   Pain Frequency Constant   Aggravating Factors  walking   Pain Relieving Factors meds            [facility] PT Assessment - 03/01/17 1028      Assessment   Medical Diagnosis R CVA   Referring Provider Antony Contras, MD   Onset Date/Surgical Date 12/15/16   Prior Therapy Acute, IP rehab, Home health     Precautions   Precautions Fall     Restrictions   Weight Bearing Restrictions No   Other Position/Activity Restrictions back surgery 2015     Balance Screen   Has the patient fallen in the past 6 months Yes   How many times? 3   Has the patient had a decrease in activity level because of a fear of falling?  Yes    Is the patient reluctant to leave their home because of a fear of falling?  No     Home Environment   Living Environment Private residence   Living Arrangements Children  cares for grandchildren   Type of [place] to enter   Entrance Stairs-Number of Steps [place] One level   [place] - 2 wheels;Shower seat     Prior Function   Level of Independence Independent   Vocation Retired   Leisure spends time with grandchildren (lives with 2, has 3).  She assists with all daily activities.     Cognition   Overall Cognitive Status Within Functional Limits for tasks assessed     Observation/Other Assessments   Focus on Therapeutic Outcomes (FOTO)  45%   Other Surveys  Other Surveys   Stroke Impact Scale  40.5%   lower extremity portion     Sensation   Light Touch Appears Intact     Posture/Postural Control   Posture/Postural Control Postural limitations   Postural Limitations Rounded Shoulders;Forward head     ROM / Strength   AROM / PROM / Strength AROM;Strength     AROM   Overall AROM  Within functional limits for tasks performed     Strength   Overall Strength Deficits   Overall Strength Comments R LE is 5/5 for hip flexion, knee flexion/extension, and ankle DF.  L LE is 3/5 fo rhip flexion, 5/5 knee extension, 4/5 knee flexion and 4/5 ankle DF.     Transfers   Transfers Sit to Stand   Sit to Stand 6: Modified independent (Device/Increase time);With upper extremity assist     Ambulation/Gait   Ambulation/Gait Yes   Ambulation/Gait Assistance 5: Supervision   Ambulation/Gait Assistance Details SBA to CGA with patient reaching for walls intermittently   Ambulation Distance (Feet) 170 Feet   Assistive device Rolling walker;None   Gait Pattern Decreased stride length;Decreased arm swing - right;Decreased arm swing - left;Decreased step length - right;Decreased step length - left;Decreased stance  time - left;Decreased weight shift to left;Antalgic   Ambulation Surface Level   Gait velocity 1.41 ft/sec without device and 1.98 ft/sec with RW.     Stairs Yes   Stairs Assistance 6: Modified independent (Device/Increase time)   Stair Management Technique One rail Right;Step to pattern  slowed speed   Number of Stairs 4  Gait Comments With Rw, the patient has improved L weight shift with equal stride.       Standardized Balance Assessment   Standardized Balance Assessment Berg Balance Test     Berg Balance Test   Sit to Stand Able to stand  independently using hands   Standing Unsupported Able to stand safely 2 minutes   Sitting with Back Unsupported but Feet Supported on Floor or Stool Able to sit safely and securely 2 minutes   Stand to Sit Controls descent by using hands   Transfers Able to transfer safely, definite need of hands   Standing Unsupported with Eyes Closed Able to stand 10 seconds with supervision   Standing Ubsupported with Feet Together Able to place feet together independently and stand 1 minute safely   From Standing, Reach Forward with Outstretched Arm Can reach forward >12 cm safely (5")  7"   From Standing Position, Pick up Object from [place] to pick up shoe safely and easily   From Standing Position, Turn to Look Behind Over each Shoulder Turn sideways only but maintains balance   Turn 360 Degrees Able to turn 360 degrees safely but slowly   Standing Unsupported, Alternately Place Feet on Step/Stool Able to complete >2 steps/needs minimal assist   Standing Unsupported, One Foot in Front Able to take small step independently and hold 30 seconds   Standing on One Leg Tries to lift leg/unable to hold 3 seconds but remains standing independently   Total Score 39   Berg comment: 39/56 indicating continued fall risk                           PT Education - 03/01/17 1453    Education provided Yes   Education Details PT goals and areas of  emphasis   Person(s) Educated Patient   Methods Explanation   Comprehension Verbalized understanding          PT Short Term Goals - 03/01/17 1457      PT SHORT TERM GOAL #1   Title The patient will be indep with HEP for LE strength, balance and general mobility.   Baseline Target date 03/31/2017   Time 4   Period Weeks     PT SHORT TERM GOAL #2   Title The patient will improve Berg score from 39/56 to > or equal to 44/56 to demo improving standing balance.   Baseline Target date 03/31/2017   Time 4   Period Weeks     PT SHORT TERM GOAL #3   Title The patient will improve gait speed from 1.41 ft/sec to > or equal to 2.0 ft/sec without assistive device for improve mobility in the home.   Baseline Target date 03/31/2017   Time 4   Period Weeks     PT SHORT TERM GOAL #4   Title The patient will move floor<>stand with CGA with UE support due to recent falls.   Baseline Target date 03/31/2017   Time 4   Period Weeks           PT Long Term Goals - 03/01/17 1459      PT LONG TERM GOAL #1   Title The patient will improve SIS LE by 12% (baseline is 40.5%).   Baseline Target date 04/29/2017   Time 8   Period Weeks     PT LONG TERM GOAL #2   Title The patient will improve gait speed from 1.41 ft/sec to >  or equal to 2.2 ft/sec to demo dec'ing risk for falls.   Baseline Target date 04/29/2017   Time 8   Period Weeks     PT LONG TERM GOAL #3   Title The patient will improve Berg from 39/56 to > or equal to 46/56 to demo dec'd risk for falls.   Baseline Target date 04/29/2017   Time 8   Period Weeks     PT LONG TERM GOAL #4   Title The patient will negotiate 4 steps without a handrail to negotiate home environment modified indep.   Baseline Target date 04/29/2017   Time 8   Period Weeks     PT LONG TERM GOAL #5   Title The patient will be indep with post d/c HEP.   Baseline Target date 04/29/2017   Time 8   Period Weeks               Plan - 03/01/17 1102     Clinical Impression Statement The patient is a 71 year old female presenting to OP PT s/p CVA in 12/2016.  She presents with fall risk per Merrilee Jansky and gait speed.  She is not using RW in home and PT encouraged her to use for community due to fall risk.  Patient is motivated to participate and will benefit from PT to optimize current functional status.    Rehab Potential Good   Clinical Impairments Affecting Rehab Potential h/o chronic back pain, patient is motivated to participate.   PT Frequency 2x / week   PT Duration 8 weeks   PT Treatment/Interventions ADLs/Self Care Home Management;Therapeutic activities;Therapeutic exercise;Neuromuscular re-education;Balance training;Gait training;Stair training;Functional mobility training;Patient/family education   PT Next Visit Plan Establish HEP: L LE strengthening (check from [facility] PT), corner balance exercises, coordination LE exercises.  Monitor LBP due to h/o pain.   Consulted and Agree with Plan of Care Patient      Patient will benefit from skilled therapeutic intervention in order to improve the following deficits and impairments:  Abnormal gait, Decreased balance, Decreased mobility, Decreased strength, Pain, Difficulty walking, Decreased activity tolerance  Visit Diagnosis: Other abnormalities of gait and mobility  Unsteadiness on feet  Muscle weakness (generalized)     Problem List Patient Active Problem List   Diagnosis Date Noted  . Chronic constipation   . Acute idiopathic gout involving toe of right foot   . Acute pain of right knee   . Failed back syndrome   . Pain   . Chronic pain syndrome   . Labile blood pressure   . Radicular pain   . Benign essential HTN   . Slow transit constipation   . Chronic bilateral low back pain without sciatica   . Dysphagia, post-stroke   . 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 Left hemiparesis ([place])   . Generalized weakness   . Acute ischemic stroke ([place]) 12/15/2016  . Urinary retention  02/25/2015  . Primary osteoarthritis of left knee 02/15/2015  . Morbid obesity ([place]) 02/15/2015  . Essential hypertension, benign 09/07/2014  . Constipation 09/07/2014  . Hyperlipidemia 09/05/2014  . Gout 09/05/2014  . Acute blood loss anemia 09/05/2014  . Neuropathy ([place]) 09/05/2014  . UTI (urinary tract infection) 08/31/2014  . Radiculopathy 08/29/2014    WEAVER,CHRISTINA, PT 03/01/2017, 3:04 PM  [facility] [address] [place], [place], [address] Phone: [phone]   Fax:  [phone]  Name: Ellen Henry MRN: [id number] Date of Birth: 1946/10/17

## 2017-03-01 NOTE — Therapy (Signed)
[facility] [address] [place], [place], [address] Phone: [phone]   Fax:  [phone]  Occupational Therapy Evaluation  Patient Details  Name: Ellen Henry MRN: [id number] Date of Birth: [birth year] Referring Provider: Dr. Leonie Man  Encounter Date: 03/01/2017      OT End of Session - 03/01/17 1333    Visit Number 1   Number of Visits 16   Date for OT Re-Evaluation 04/26/17   Authorization Type medicare will need G code and PN every 10th visit   Authorization Time Period 60 days   Authorization - Visit Number 1   Authorization - Number of Visits 10   OT Start Time 0932   OT Stop Time 1013   OT Time Calculation (min) 41 min   Activity Tolerance Patient tolerated treatment well      Past Medical History:  Diagnosis Date  . Arthritis   . Dry skin   . GERD (gastroesophageal reflux disease)    "years ago", diet controlled  . Gout   . H/O pyelonephritis    as a child  . 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 Hypertension   . Neuropathy ([place])   . Pneumonia   . Radiculopathy   . Sleep apnea    does not use cpap  . Stroke [facility])     Past Surgical History:  Procedure Laterality Date  . ABDOMINAL EXPOSURE N/A 08/29/2014   Procedure: ABDOMINAL EXPOSURE;  Surgeon: Rosetta Posner, MD;  Location: [facility];  Service: Vascular;  Laterality: N/A;  . ABDOMINAL HYSTERECTOMY    . ANTERIOR LUMBAR FUSION N/A 08/29/2014   Procedure: ANTERIOR LUMBAR FUSION 1 LEVEL;  Surgeon: Sinclair Ship, MD;  Location: [facility];  Service: Orthopedics;  Laterality: N/A;  Lumbar 4-5 anterior lumbar interbody fusion with allograft and instrumentation.  . APPENDECTOMY    . BACK SURGERY  08/28/2014 and 08/29/2014   lumbar fusion  . CARPAL TUNNEL RELEASE Right    release done twice  . COLONOSCOPY    . JOINT REPLACEMENT    . left knee replacement    . REPLACEMENT TOTAL KNEE  2010   rigth knee  . TONSILLECTOMY    . TOTAL  KNEE ARTHROPLASTY Left 02/15/2015   Procedure: TOTAL KNEE ARTHROPLASTY;  Surgeon: [name], MD;  Location: [place];  Service: Orthopedics;  Laterality: Left;    Vitals:   03/01/17 0938  BP: 98/69  Pulse: 88        Subjective Assessment - 03/01/17 0938    Subjective  My left arm gets tired easy.   Pertinent History pt with RCVA 12/15/2016;  pt completed inpt rehab as well as [facility] and OT.  Pt had back surgery in 2015 with residual pain.  Pt had fallen three times since discharge home.    Patient Stated Goals get back to doing things the way I did them before   Currently in Pain? Yes   Pain Score 5    Pain Location Back   Pain Orientation Lower;Mid   Pain Descriptors / Indicators Aching   Pain Type Chronic pain   Pain Frequency Constant   Aggravating Factors  walking or standing too long   Pain Relieving Factors pain patch, getting off my feet, heat            [facility] OT Assessment - 03/01/17 0001      Assessment   Diagnosis R CVA   Referring Provider Dr.  Sethi   Onset Date 12/15/16   Prior Therapy inpt rehab followed by [facility] PT and OT;  home health therapies have stopped and pt now transitioning to outpatient.     Precautions   Precautions Fall  since pt has been home   Precaution Comments Pt to see PT today     Restrictions   Weight Bearing Restrictions No   Other Position/Activity Restrictions Pt did have back surgery 2015 with residual pain     Balance Screen   Has the patient fallen in the past 6 months Yes  getting in bed, stairs and turning suddenly   How many times? 3  pt with PT eval today     Home  Environment   Family/patient expects to be discharged to: Private residence   Living Arrangements Children  dtr and grandchildren 82, 31 and 8   Available Help at Discharge Available PRN/intermittently   Type of [place] One level   Bathroom Shower/Tub Haematologist   Additional Comments Pt has shower seat with  back, no grab bars. Pt pulls walker close to tub and hangs on while she enters and exits tub     Prior Function   Level of Independence Independent   Vocation Retired   Leisure spend time with grandchildren, homemaker, cooking     ADL   Eating/Feeding Modified independent  increased time and more dificult   Grooming Independent   Upper Body Bathing Minimal assistance  dtr helps with back   Lower Body Bathing Modified independent   Upper Body Dressing Increased time   Lower Body Dressing Increased time   Toilet Tranfer Modified independent   Toileting - Clothing Manipulation Modified independent   Toileting -  Fish farm manager Modified independent     IADL   Shopping Shops independently for small purchases   Light Housekeeping Maintains house alone or with occasional assistance  pt walking in home without her walker   Meal Prep Plans, prepares and serves adequate meals independently   Programmer, applications own vehicle  pt uses walker and needs supevision in community   Medication Management Is responsible for taking medication in correct dosages at correct time   Physiological scientist financial matters independently (budgets, writes checks, pays rent, bills goes to bank), collects and keeps track of income     Mobility   Mobility Status History of falls   Mobility Status Comments see above     Written Expression   Dominant Hand Right     Vision - History   Baseline Vision Wears glasses only for reading   Additional Comments Pt states no visual problems prior      Vision Assessment   Comment PT notes vision for reading more blurry than before - pt states if she wears her readers this corrects it.     Activity Tolerance   Activity Tolerance Tolerate 30+ min activity without fatigue     Cognition   Overall Cognitive Status Within Functional Limits for tasks assessed   Mini Mental State Exam  Pt reports "I can get it done but I  think I might be a little slower to think through some things"     Sensation   Light Touch Appears Intact   Hot/Cold Appears Intact   Proprioception Appears Intact     Coordination   Gross Motor Movements are Fluid and Coordinated No  pt has AROM WF''s but movements  not fluid   Fine Motor Movements are Fluid and Coordinated No   Finger Nose Finger Test moderately impaired - slow and ataxic movement with LUE   9 Hole Peg Test Right;Left   Right 9 Hole Peg Test R= 29.53   Left 9 Hole Peg Test L= 52.25   Box and Blocks L= 44  ataxia increased as pt's arm fatigued     Tone   Assessment Location Left Upper Extremity     ROM / Strength   AROM / PROM / Strength AROM;Strength     AROM   Overall AROM  Within functional limits for tasks performed     Strength   Overall Strength Deficits   Overall Strength Comments Pt with 3+/5 for shoulder flexion, extension, all othe WFL's  See below for grip strength     Hand Function   Right Hand Gross Grasp Functional   Right Hand Grip (lbs) 65   Left Hand Gross Grasp Impaired   Left Hand Grip (lbs) 35     LUE Tone   LUE Tone Modified Ashworth     LUE Tone   Modified Ashworth Scale for Grading Hypertonia LUE Slight increase in muscle tone, manifested by a catch, followed by minimal resistance throughout the remainder (less than half) of the ROM                           OT Short Term Goals - 03/01/17 1053      OT SHORT TERM GOAL #1   Title Pt will be mod I with HEP - 03/29/2017   Status New     OT SHORT TERM GOAL #2   Title Pt will demonstrat improved coordination as evidenced by decreasing time on 9 hole peg test by at least 5 seconds   Status New     OT SHORT TERM GOAL #3   Title Pt will demostrate ability to use LUE as non dominant 75% of the time for ADL and IADL tasks     OT SHORT TERM GOAL #4   Status New     OT SHORT TERM GOAL #5   Title Pt will demonstrate ability to pick up 2 pound object off shelf x3  without dropping   Status New     Additional Short Term Goals   Additional Short Term Goals Yes     OT SHORT TERM GOAL #6   Title Pt will be mod I with UB bathing AE prn   Status New           OT Long Term Goals - 03/01/17 1057      OT LONG TERM GOAL #1   Title Pt will be mod I with upgraded HEP - 04/26/2017   Status New     OT LONG TERM GOAL #2   Title Pt will demonstrate improved coordination as evidenced by decreasing time on 9 hole peg by 10 seconds to assist with functional tasks.   Status New     OT LONG TERM GOAL #3   Title Pt will be able to complete fine motor tasks bilaterally at mid reach for activity (using LUE as non dominant)   Status New     OT LONG TERM GOAL #4   Title Pt will be mod I with cooking and laundry without walker and no LOB   Status New     OT LONG TERM GOAL #5   Title Pt will be able to  lift 4 pound weight off overhead shelf x3 without dropping   Status New     OT LONG TERM GOAL #6   Title Pt will demonstrate improved grip strength by at least 8 pounds in LUE to assist with functional tasks.    Status New     OT LONG TERM GOAL #7   Title Pt will use LUE as non dominant 95% of the time in ADL and I ADL tasks.   Status New     OT LONG TERM GOAL #8   Title Pt will tolerate at least 40 minutes of activity at ambulatory level without requiring a rest break.   Status New               Plan - 03/01/17 1326    Clinical Impression Statement Pt is a 71 year old female s/p R CVA on 12/15/2016 and hospitalized until 12/31/2016 including inpt rehab stay.  Pt also had HHPT and HHOT.  PMH: back surgery 2015 with residual back pain and radiculopathy, OSA, athritis, HTN, sleep apnea, GERD.  Pt presents today with the following impairments that impact ADL, IADL and leisure activities:  L hemiplegia, mild increased tone, ataxia, abnormal posture, decreased strength, decreased grip strength, decreased coordination, decreased back pain, decreased  activity tolerance.  Pt will benefit from skilled OT to maximize independence.   Rehab Potential Excellent   OT Frequency 2x / week   OT Duration 8 weeks   OT Treatment/Interventions Self-care/ADL training;Therapeutic exercise;Neuromuscular education;Energy conservation;DME and/or AE instruction;Manual Therapy;Therapist, nutritional;Therapeutic activities;Patient/family education;Balance training   Plan initiate HEP for grip strength, coordination   Consulted and Agree with Plan of Care Patient      Patient will benefit from skilled therapeutic intervention in order to improve the following deficits and impairments:  Decreased activity tolerance, Decreased balance, Decreased coordination, Decreased strength, Difficulty walking, Impaired UE functional use, Impaired tone  Visit Diagnosis: Hemiplegia and hemiparesis following cerebral infarction affecting left non-dominant side (HCC) - Plan: Ot plan of care cert/re-cert  Muscle weakness (generalized) - Plan: Ot plan of care cert/re-cert  Other lack of coordination - Plan: Ot plan of care cert/re-cert  Abnormal posture - Plan: Ot plan of care cert/re-cert  Unsteadiness on feet - Plan: Ot plan of care cert/re-cert  Other symptoms and signs involving the nervous system - Plan: Ot plan of care cert/re-cert      G-Codes - 40/08/67 1336    Functional Assessment Tool Used (Outpatient only) 9 hole peg, dynamometer, skilled clinical observation   Functional Limitation Carrying, moving and handling objects   Carrying, Moving and Handling Objects Current Status (Y1950) At least 60 percent but less than 80 percent impaired, limited or restricted   Carrying, Moving and Handling Objects Goal Status (D3267) At least 20 percent but less than 40 percent impaired, limited or restricted      Problem List Patient Active Problem List   Diagnosis Date Noted  . Chronic constipation   . Acute idiopathic gout involving toe of right foot   . Acute  pain of right knee   . Failed back syndrome   . Pain   . Chronic pain syndrome   . Labile blood pressure   . Radicular pain   . Benign essential HTN   . Slow transit constipation   . Chronic bilateral low back pain without sciatica   . Dysphagia, post-stroke   . Stroke (cerebrum) ([place]) 12/17/2016  . Left hemiparesis ([place])   . Generalized weakness   .  Acute ischemic stroke ([place]) 12/15/2016  . Urinary retention 02/25/2015  . Primary osteoarthritis of left knee 02/15/2015  . Morbid obesity ([place]) 02/15/2015  . Essential hypertension, benign 09/07/2014  . Constipation 09/07/2014  . Hyperlipidemia 09/05/2014  . Gout 09/05/2014  . Acute blood loss anemia 09/05/2014  . Neuropathy ([place]) 09/05/2014  . UTI (urinary tract infection) 08/31/2014  . Radiculopathy 08/29/2014    Quay Burow, OTR/L 03/01/2017, 1:38 PM  [facility] [address] [place], [place], [address] Phone: [phone]   Fax:  [phone]  Name: Ellen Henry MRN: [id number] Date of Birth: June 08, 1946

## 2017-03-01 NOTE — Addendum Note (Signed)
Addended by: Rudell Cobb M on: 03/01/2017 03:06 PM   Modules accepted: Orders

## 2017-03-04 DIAGNOSIS — I10 Essential (primary) hypertension: Secondary | ICD-10-CM | POA: Diagnosis not present

## 2017-03-10 ENCOUNTER — Ambulatory Visit: Payer: Medicare Other | Attending: Neurology | Admitting: Occupational Therapy

## 2017-03-10 ENCOUNTER — Encounter: Payer: Self-pay | Admitting: Occupational Therapy

## 2017-03-10 DIAGNOSIS — M6281 Muscle weakness (generalized): Secondary | ICD-10-CM | POA: Diagnosis not present

## 2017-03-10 DIAGNOSIS — R278 Other lack of coordination: Secondary | ICD-10-CM | POA: Diagnosis not present

## 2017-03-10 DIAGNOSIS — R2689 Other abnormalities of gait and mobility: Secondary | ICD-10-CM | POA: Diagnosis not present

## 2017-03-10 DIAGNOSIS — R293 Abnormal posture: Secondary | ICD-10-CM

## 2017-03-10 DIAGNOSIS — R2681 Unsteadiness on feet: Secondary | ICD-10-CM | POA: Diagnosis not present

## 2017-03-10 DIAGNOSIS — R29818 Other symptoms and signs involving the nervous system: Secondary | ICD-10-CM | POA: Diagnosis not present

## 2017-03-10 DIAGNOSIS — I69354 Hemiplegia and hemiparesis following cerebral infarction affecting left non-dominant side: Secondary | ICD-10-CM | POA: Diagnosis not present

## 2017-03-10 NOTE — Therapy (Signed)
[facility] [address] [place], [place], [address] Phone: [phone]   Fax:  [phone]  Occupational Therapy Treatment  Patient Details  Name: Ellen Henry MRN: [id number] Date of Birth: Jul 23, 1946 Referring Provider: Dr. Leonie Man  Encounter Date: 03/10/2017      OT End of Session - 03/10/17 1112    Visit Number 2   Number of Visits 16   Date for OT Re-Evaluation 04/26/17   Authorization Type medicare will need G code and PN every 10th visit   Authorization Time Period 60 days   Authorization - Visit Number 2   Authorization - Number of Visits 10   OT Start Time 1016   OT Stop Time 1059   OT Time Calculation (min) 43 min   Activity Tolerance Patient tolerated treatment well      Past Medical History:  Diagnosis Date  . Arthritis   . Dry skin   . GERD (gastroesophageal reflux disease)    "years ago", diet controlled  . Gout   . H/O pyelonephritis    as a child  . 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 Hypertension   . Neuropathy ([place])   . Pneumonia   . Radiculopathy   . Sleep apnea    does not use cpap  . Stroke [facility])     Past Surgical History:  Procedure Laterality Date  . ABDOMINAL EXPOSURE N/A 08/29/2014   Procedure: ABDOMINAL EXPOSURE;  Surgeon: Rosetta Posner, MD;  Location: [facility];  Service: Vascular;  Laterality: N/A;  . ABDOMINAL HYSTERECTOMY    . ANTERIOR LUMBAR FUSION N/A 08/29/2014   Procedure: ANTERIOR LUMBAR FUSION 1 LEVEL;  Surgeon: Sinclair Ship, MD;  Location: [facility];  Service: Orthopedics;  Laterality: N/A;  Lumbar 4-5 anterior lumbar interbody fusion with allograft and instrumentation.  . APPENDECTOMY    . BACK SURGERY  08/28/2014 and 08/29/2014   lumbar fusion  . CARPAL TUNNEL RELEASE Right    release done twice  . COLONOSCOPY    . JOINT REPLACEMENT    . left knee replacement    . REPLACEMENT TOTAL KNEE  2010   rigth knee  . TONSILLECTOMY    . TOTAL  KNEE ARTHROPLASTY Left 02/15/2015   Procedure: TOTAL KNEE ARTHROPLASTY;  Surgeon: [name], MD;  Location: [facility];  Service: Orthopedics;  Laterality: Left;    There were no vitals filed for this visit.      Subjective Assessment - 03/10/17 1021    Pertinent History pt with RCVA 12/15/2016;  pt completed inpt rehab as well as [facility] PT and OT.  Pt had back surgery in 2015 with residual pain.  Pt had fallen three times since discharge home.    Patient Stated Goals get back to doing things the way I did them before   Currently in Pain? Yes   Pain Score 5    Pain Location Back   Pain Orientation Lower;Mid   Pain Descriptors / Indicators Aching   Pain Type Chronic pain   Pain Onset More than a month ago   Pain Frequency Constant   Aggravating Factors  walking or standing aggravate, tires easily   Pain Relieving Factors pain patch, rest,heat   Multiple Pain Sites Yes   Pain Score 4   Pain Location Shoulder   Pain Descriptors / Indicators Aching   Pain Type Chronic pain   Pain Onset More than a month ago   Pain Frequency  Constant   Aggravating Factors  I think I am walking more and using the walker more so it it feels like muscle soreness   Pain Relieving Factors voltran creme (pt had this imtermittently prior to stroke)                      OT Treatments/Exercises (OP) - 03/10/17 0001      ADLs   ADL Comments Reviewed goals and tx plan with pt and pt in agreement - pt given written copy of goals.       Neurological Re-education Exercises   Other Exercises 1 Neuro re ed to develop and issue HEP for LUE to address strengthening, encourage overhead reach, address motor control (ataxia) and core strength.  Pt will need to ramp up slowly due to history of signficant back pain an arthritis. Pt able to verbalize understanding and return demonstratae.  Pt also issued HEP to address grip and pinch strength with red putty.  Addressed functional mobility without walker as pt reports  she rately uses walker at home. Pt is unsafe at this time to ambulate without RW - discussed this with pt and pt verbaized understanding. Strongly recommended that pt use walker at all times due to high fall risk and decreased control due to ataxia.  Pt states "I guess I was trying to push myself but I have lost my balance and I am holding onto furniture when I walk at home. Pt has verbalized she will use walker now at home until cleared by PT.                 OT Education - 03/10/17 1109    Education provided Yes   Education Details HEP for LUE and grip strength (red putty)   Person(s) Educated Patient   Methods Explanation;Demonstration;Verbal cues;Handout   Comprehension Verbalized understanding;Returned demonstration          OT Short Term Goals - 03/10/17 1109      OT SHORT TERM GOAL #1   Title Pt will be mod I with HEP - 03/29/2017   Status On-going     OT SHORT TERM GOAL #2   Title Pt will demonstrat improved coordination as evidenced by decreasing time on 9 hole peg test by at least 5 seconds (baseline R= 29.53, L=52.25)   Status On-going     OT SHORT TERM GOAL #3   Title Pt will demostrate ability to use LUE as non dominant 75% of the time for ADL and IADL tasks   Status On-going     OT SHORT TERM GOAL #4   Status On-going     OT SHORT TERM GOAL #5   Title Pt will demonstrate ability to pick up 2 pound object off shelf x3 without dropping   Status On-going     OT SHORT TERM GOAL #6   Title Pt will be mod I with UB bathing AE prn   Status On-going           OT Long Term Goals - 03/10/17 1109      OT LONG TERM GOAL #1   Title Pt will be mod I with upgraded HEP - 04/26/2017   Status On-going     OT LONG TERM GOAL #2   Title Pt will demonstrate improved coordination as evidenced by decreasing time on 9 hole peg by 10 seconds to assist with functional tasks (R=29.53, L=52.23)   Status On-going     OT LONG TERM GOAL #  3   Title Pt will be able to  complete fine motor tasks bilaterally at mid reach for activity (using LUE as non dominant)   Status On-going     OT LONG TERM GOAL #4   Title Pt will be mod I with cooking and laundry without walker and no LOB   Status On-going     OT LONG TERM GOAL #5   Title Pt will be able to lift 4 pound weight off overhead shelf x3 without dropping   Status On-going     OT LONG TERM GOAL #6   Title Pt will demonstrate improved grip strength by at least 8 pounds in LUE to assist with functional tasks (baseline= 35)   Status On-going     OT LONG TERM GOAL #7   Title Pt will use LUE as non dominant 95% of the time in ADL and I ADL tasks.   Status On-going     OT LONG TERM GOAL #8   Title Pt will tolerate at least 40 minutes of activity at ambulatory level without requiring a rest break.   Status On-going               Plan - 03/10/17 1110    Clinical Impression Statement Pt progressing toward goals. Pt in agreement with goals and tx plan.   Rehab Potential Excellent   OT Frequency 2x / week   OT Duration 8 weeks   OT Treatment/Interventions Self-care/ADL training;Therapeutic exercise;Neuromuscular education;Energy conservation;DME and/or AE instruction;Manual Therapy;Therapist, nutritional;Therapeutic activities;Patient/family education;Balance training   Plan check HEP, NMR for LUE reach and control, coordination, trunk control, activity tolerance and balance.   Consulted and Agree with Plan of Care Patient      Patient will benefit from skilled therapeutic intervention in order to improve the following deficits and impairments:  Decreased activity tolerance, Decreased balance, Decreased coordination, Decreased strength, Difficulty walking, Impaired UE functional use, Impaired tone  Visit Diagnosis: Hemiplegia and hemiparesis following cerebral infarction affecting left non-dominant side (HCC)  Muscle weakness (generalized)  Other lack of coordination  Abnormal  posture  Unsteadiness on feet  Other symptoms and signs involving the nervous system    Problem List Patient Active Problem List   Diagnosis Date Noted  . Chronic constipation   . Acute idiopathic gout involving toe of right foot   . Acute pain of right knee   . Failed back syndrome   . Pain   . Chronic pain syndrome   . Labile blood pressure   . Radicular pain   . Benign essential HTN   . Slow transit constipation   . Chronic bilateral low back pain without sciatica   . Dysphagia, post-stroke   . Stroke (cerebrum) ([place]) 12/17/2016  . Left hemiparesis ([place])   . Generalized weakness   . Acute ischemic stroke ([place]) 12/15/2016  . Urinary retention 02/25/2015  . Primary osteoarthritis of left knee 02/15/2015  . Morbid obesity ([place]) 02/15/2015  . Essential hypertension, benign 09/07/2014  . Constipation 09/07/2014  . Hyperlipidemia 09/05/2014  . Gout 09/05/2014  . Acute blood loss anemia 09/05/2014  . Neuropathy ([place]) 09/05/2014  . UTI (urinary tract infection) 08/31/2014  . Radiculopathy 08/29/2014    Quay Burow, OTR/L 03/10/2017, 11:13 AM  [facility] [address] [place], [place], [address] Phone: [phone]   Fax:  [phone]  Name: Ellen Henry MRN: [id number] Date of Birth: November 26, 1946

## 2017-03-10 NOTE — Patient Instructions (Signed)
1. Grip Strengthening (Resistive Putty)  Do every day one time a day   Squeeze putty using thumb and all fingers. Repeat _10___ times. Do __2__ sessions per day.   2. Roll putty into tube on table and pinch- do this 3 times.  Arm Exercises: 1. Lay on your back and hold a large beach ball between your hands.  Start with the ball on your chest.  Raise the ball to the ceiling until your elbows are straight and hold for slow count of 5.  Return ball to chest. Do 15, REST then do 15 more. 2. Lay on your back and hold a large beach ball between your hands.  Start with the ball on your chest. Raise the ball toward the ceiling until your elbows are straight.  KEEPING YOUR ELBOWS STRAIGHT, SLOWLY raise the ball as far as you can over your head, then SLOWLY bring ball back until it is over your chest.  Do 10, rest then do 10 more. Go slowly and try and control the ball as you move.    If you need to take a break while doing the exercises it is ok to do that.  Make sure you place a small blanket roll under your knees to relieve the pressure on your lower back!!    Copyright  VHI. All rights reserved.

## 2017-03-16 ENCOUNTER — Ambulatory Visit: Payer: Medicare Other | Admitting: Occupational Therapy

## 2017-03-17 ENCOUNTER — Other Ambulatory Visit: Payer: Self-pay | Admitting: Physical Medicine & Rehabilitation

## 2017-03-17 ENCOUNTER — Ambulatory Visit: Payer: Medicare Other | Admitting: Physical Therapy

## 2017-03-23 ENCOUNTER — Encounter: Payer: Self-pay | Admitting: Occupational Therapy

## 2017-03-25 ENCOUNTER — Ambulatory Visit: Payer: Medicare Other | Admitting: Rehabilitative and Restorative Service Providers"

## 2017-03-25 ENCOUNTER — Ambulatory Visit: Payer: Medicare Other | Admitting: *Deleted

## 2017-03-25 DIAGNOSIS — R2681 Unsteadiness on feet: Secondary | ICD-10-CM

## 2017-03-25 DIAGNOSIS — R29818 Other symptoms and signs involving the nervous system: Secondary | ICD-10-CM | POA: Diagnosis not present

## 2017-03-25 DIAGNOSIS — R278 Other lack of coordination: Secondary | ICD-10-CM | POA: Diagnosis not present

## 2017-03-25 DIAGNOSIS — R293 Abnormal posture: Secondary | ICD-10-CM | POA: Diagnosis not present

## 2017-03-25 DIAGNOSIS — R2689 Other abnormalities of gait and mobility: Secondary | ICD-10-CM | POA: Diagnosis not present

## 2017-03-25 DIAGNOSIS — I69354 Hemiplegia and hemiparesis following cerebral infarction affecting left non-dominant side: Secondary | ICD-10-CM | POA: Diagnosis not present

## 2017-03-25 DIAGNOSIS — M6281 Muscle weakness (generalized): Secondary | ICD-10-CM | POA: Diagnosis not present

## 2017-03-25 NOTE — Therapy (Signed)
[facility] [address] [place], [place], [address] Phone: [phone]   Fax:  [phone]  Physical Therapy Treatment  Patient Details  Name: Ellen Henry MRN: [id number] Date of Birth: [birth year] Referring Provider: Antony Contras, MD  Encounter Date: 03/25/2017      PT End of Session - 03/25/17 1138    Visit Number 2   Number of Visits 16   Date for PT Re-Evaluation 04/30/17   Authorization Type G code every 10th visit   PT Start Time 1020   PT Stop Time 1100   PT Time Calculation (min) 40 min   Equipment Utilized During Treatment Gait belt   Activity Tolerance Patient tolerated treatment well   Behavior During Therapy [facility] for tasks assessed/performed      Past Medical History:  Diagnosis Date  . Arthritis   . Dry skin   . GERD (gastroesophageal reflux disease)    "years ago", diet controlled  . Gout   . H/O pyelonephritis    as a child  . 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 Hypertension   . Neuropathy   . Pneumonia   . Radiculopathy   . Sleep apnea    does not use cpap  . Stroke [facility])     Past Surgical History:  Procedure Laterality Date  . ABDOMINAL EXPOSURE N/A 08/29/2014   Procedure: ABDOMINAL EXPOSURE;  Surgeon: Rosetta Posner, MD;  Location: [place];  Service: Vascular;  Laterality: N/A;  . ABDOMINAL HYSTERECTOMY    . ANTERIOR LUMBAR FUSION N/A 08/29/2014   Procedure: ANTERIOR LUMBAR FUSION 1 LEVEL;  Surgeon: Sinclair Ship, MD;  Location: [place];  Service: Orthopedics;  Laterality: N/A;  Lumbar 4-5 anterior lumbar interbody fusion with allograft and instrumentation.  . APPENDECTOMY    . BACK SURGERY  08/28/2014 and 08/29/2014   lumbar fusion  . CARPAL TUNNEL RELEASE Right    release done twice  . COLONOSCOPY    . JOINT REPLACEMENT    . left knee replacement    . REPLACEMENT TOTAL KNEE  2010   rigth knee  . TONSILLECTOMY    . TOTAL KNEE ARTHROPLASTY Left 02/15/2015    Procedure: TOTAL KNEE ARTHROPLASTY;  Surgeon: [name], MD;  Location: [place];  Service: Orthopedics;  Laterality: Left;    There were no vitals filed for this visit.      Subjective Assessment - 03/25/17 1020    Subjective The patient report no falls since evaluation.  She tried to walk outdoors yesterday without the walker.  She notes stairs are the most difficult b/c she does not have a handrail.     Patient Stated Goals "Be able to use my left side withut falling, getting off of the walker, gaining strength."   Currently in Pain? Yes   Pain Score 5    Pain Location Back   Pain Orientation Lower   Pain Descriptors / Indicators Aching   Pain Type Chronic pain   Pain Onset More than a month ago   Pain Frequency Constant   Aggravating Factors  activity aggravates   Pain Relieving Factors pain patch, rest   Effect of Pain on Daily Activities PT monitoring, but no goal to follow as we are working on stroke rehab and monitoring chronic LBP.                          [facility] Adult PT  Treatment/Exercise - 03/25/17 1038      Ambulation/Gait   Ambulation/Gait Yes   Ambulation/Gait Assistance 5: Supervision   Ambulation/Gait Assistance Details required CGA one episode during L foot drag   Ambulation Distance (Feet) 200 Feet   Assistive device Rolling walker   also walked without device    Ambulation Surface Level     Neuro Re-ed    Neuro Re-ed Details  Corner balance exercises performing feet apart + eyes closed, feet partial tandem with eyes closed.      Exercises   Exercises Other Exercises   Other Exercises  Marching in place near countertop, heel raises (bilateral to attempting Single leg, however too difficult), toe raises, sidestepping, mini lunges alternating R and L emphasizing speed of movement.  SLR supine x 10 reps R and L with cues on performance/positioning.  Supine marching with core contraction, sidelying clam shells, IT band stretch.                  PT Education - 03/25/17 1137    Education provided Yes   Education Details HEP: I.T. band stretch, SLR, clam shells, corner balance with eyes closed, marching, heel raises, side stepping   Person(s) Educated Patient   Methods Explanation;Demonstration;Handout   Comprehension Verbalized understanding;Returned demonstration          PT Short Term Goals - 03/01/17 1457      PT SHORT TERM GOAL #1   Title The patient will be indep with HEP for LE strength, balance and general mobility.   Baseline Target date 03/31/2017   Time 4   Period Weeks     PT SHORT TERM GOAL #2   Title The patient will improve Berg score from 39/56 to > or equal to 44/56 to demo improving standing balance.   Baseline Target date 03/31/2017   Time 4   Period Weeks     PT SHORT TERM GOAL #3   Title The patient will improve gait speed from 1.41 ft/sec to > or equal to 2.0 ft/sec without assistive device for improve mobility in the home.   Baseline Target date 03/31/2017   Time 4   Period Weeks     PT SHORT TERM GOAL #4   Title The patient will move floor<>stand with CGA with UE support due to recent falls.   Baseline Target date 03/31/2017   Time 4   Period Weeks           PT Long Term Goals - 03/01/17 1459      PT LONG TERM GOAL #1   Title The patient will improve SIS LE by 12% (baseline is 40.5%).   Baseline Target date 04/29/2017   Time 8   Period Weeks     PT LONG TERM GOAL #2   Title The patient will improve gait speed from 1.41 ft/sec to > or equal to 2.2 ft/sec to demo dec'ing risk for falls.   Baseline Target date 04/29/2017   Time 8   Period Weeks     PT LONG TERM GOAL #3   Title The patient will improve Berg from 39/56 to > or equal to 46/56 to demo dec'd risk for falls.   Baseline Target date 04/29/2017   Time 8   Period Weeks     PT LONG TERM GOAL #4   Title The patient will negotiate 4 steps without a handrail to negotiate home environment modified indep.    Baseline Target date 04/29/2017   Time 8  Period Weeks     PT LONG TERM GOAL #5   Title The patient will be indep with post d/c HEP.   Baseline Target date 04/29/2017   Time 8   Period Weeks               Plan - 03/25/17 1139    Clinical Impression Statement The patient tolerated HEP well.  PT is addressing LE weakness and some of the activities may help with hip stability to stabilize back.  Patient able to perform safely and feels she can do independently with written instructions in the home.    PT Treatment/Interventions ADLs/Self Care Home Management;Therapeutic activities;Therapeutic exercise;Neuromuscular re-education;Balance training;Gait training;Stair training;Functional mobility training;Patient/family education   PT Next Visit Plan Check HEP; in clinic, LE strengthening; postural stability; L LE foot clearance/coordination during gait.  Stair negotiation/training**   Consulted and Agree with Plan of Care Patient      Patient will benefit from skilled therapeutic intervention in order to improve the following deficits and impairments:  Abnormal gait, Decreased balance, Decreased mobility, Decreased strength, Pain, Difficulty walking, Decreased activity tolerance  Visit Diagnosis: Muscle weakness (generalized)  Unsteadiness on feet  Other abnormalities of gait and mobility     Problem List Patient Active Problem List   Diagnosis Date Noted  . Chronic constipation   . Acute idiopathic gout involving toe of right foot   . Acute pain of right knee   . Failed back syndrome   . Pain   . Chronic pain syndrome   . Labile blood pressure   . Radicular pain   . Benign essential HTN   . Slow transit constipation   . Chronic bilateral low back pain without sciatica   . Dysphagia, post-stroke   . 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 Left hemiparesis ([place])   . Generalized weakness   . Acute ischemic stroke ([place]) 12/15/2016  . Urinary retention 02/25/2015  .  Primary osteoarthritis of left knee 02/15/2015  . Morbid obesity ([place]) 02/15/2015  . Essential hypertension, benign 09/07/2014  . Constipation 09/07/2014  . Hyperlipidemia 09/05/2014  . Gout 09/05/2014  . Acute blood loss anemia 09/05/2014  . Neuropathy 09/05/2014  . UTI (urinary tract infection) 08/31/2014  . Radiculopathy 08/29/2014    WEAVER,CHRISTINA, PT  03/25/2017, 11:43 AM  [facility] [address] [place], [place], [address] Phone: [phone]   Fax:  [phone]  Name: Ellen Henry MRN: [id number] Date of Birth: [birth year]

## 2017-03-25 NOTE — Patient Instructions (Signed)
Monday, Wednesday, Friday   Outer Hip Stretch: Reclined IT Band Stretch (Strap)    Strap around opposite foot, pull across only as far as possible with shoulders on mat.  Can do without strap letting leg cross your body to stretch outer hip/leg. Hold for __20-30 seconds. Repeat _2-3___ times each leg.  Repeat 1-2 times/day.  Copyright  VHI. All rights reserved.    Straight Leg Raise    Tighten stomach and slowly raise locked right leg _8___ inches from floor. Repeat __10__ times per set. Do __2__ sets per session. Do __1-2__ sessions per day.  http://orth.exer.us/1103   Copyright  VHI. All rights reserved.   Abduction: Clam (Eccentric) - Side-Lying    Lie on side with knees bent. Lift top knee, keeping feet together. Keep trunk steady. Slowly lower for 3-5 seconds. __10_ reps per set, __1-2_ sets per day.  http://ecce.exer.us/65   Copyright  VHI. All rights reserved.    Tuesday, Thursday, Saturday  "I love a [place]" Lift    Using a chair if necessary, march in place 10 times.  Rest and repeat 10 more on each leg (alternating). Do _1-2___ sessions per day.  http://gt2.exer.us/345   Copyright  VHI. All rights reserved.   AMBULATION: Side Step    Step sideways. Repeat in opposite direction.  BE NEAR A COUNTERTOP, BUT NOT HOLDING. 10 steps to each side.  Repeat 1-2 times/day.  Copyright  VHI. All rights reserved.    Heel Raises    Stand with support--LET GO AS YOU CAN SAFELY.  Tighten pelvic floor and hold. With knees straight, raise heels off ground. Hold _ 3-5_ seconds.Repeat _20__ times. Do __1-2_ times a day.  Copyright  VHI. All rights reserved.   Feet Partial Heel-Toe, Varied Arm Positions - Eyes Closed    Stand with right foot partially in front of the other and arms out. Close eyes and visualize upright position. Hold __30__ seconds. Repeat __3__ times alternating feet per session. Do __1-2__ sessions per day.  Copyright  VHI. All  rights reserved.

## 2017-03-26 DIAGNOSIS — Z0000 Encounter for general adult medical examination without abnormal findings: Secondary | ICD-10-CM | POA: Diagnosis not present

## 2017-03-30 ENCOUNTER — Ambulatory Visit: Payer: Medicare Other | Admitting: Rehabilitative and Restorative Service Providers"

## 2017-03-30 ENCOUNTER — Encounter: Payer: Self-pay | Admitting: Occupational Therapy

## 2017-03-30 ENCOUNTER — Ambulatory Visit: Payer: Medicare Other | Admitting: Occupational Therapy

## 2017-03-30 DIAGNOSIS — R2681 Unsteadiness on feet: Secondary | ICD-10-CM | POA: Diagnosis not present

## 2017-03-30 DIAGNOSIS — I69354 Hemiplegia and hemiparesis following cerebral infarction affecting left non-dominant side: Secondary | ICD-10-CM

## 2017-03-30 DIAGNOSIS — R278 Other lack of coordination: Secondary | ICD-10-CM | POA: Diagnosis not present

## 2017-03-30 DIAGNOSIS — M6281 Muscle weakness (generalized): Secondary | ICD-10-CM | POA: Diagnosis not present

## 2017-03-30 DIAGNOSIS — R293 Abnormal posture: Secondary | ICD-10-CM

## 2017-03-30 DIAGNOSIS — R29818 Other symptoms and signs involving the nervous system: Secondary | ICD-10-CM

## 2017-03-30 DIAGNOSIS — R2689 Other abnormalities of gait and mobility: Secondary | ICD-10-CM | POA: Diagnosis not present

## 2017-03-30 NOTE — Therapy (Signed)
[facility] [address] [place], [place], [address] Phone: [phone]   Fax:  [phone]  Physical Therapy Treatment  Patient Details  Name: Ellen Henry MRN: [id number] Date of Birth: [birth year] Referring Provider: Antony Contras, MD  Encounter Date: 03/30/2017      PT End of Session - 03/30/17 1113    Visit Number 3   Number of Visits 16   Date for PT Re-Evaluation 04/30/17   Authorization Type G code every 10th visit   PT Start Time 1108   PT Stop Time 1148   PT Time Calculation (min) 40 min   Equipment Utilized During Treatment Gait belt   Activity Tolerance Patient tolerated treatment well   Behavior During Therapy [facility] for tasks assessed/performed      Past Medical History:  Diagnosis Date  . Arthritis   . Dry skin   . GERD (gastroesophageal reflux disease)    "years ago", diet controlled  . Gout   . H/O pyelonephritis    as a child  . 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 Hypertension   . Neuropathy   . Pneumonia   . Radiculopathy   . Sleep apnea    does not use cpap  . Stroke [facility])     Past Surgical History:  Procedure Laterality Date  . ABDOMINAL EXPOSURE N/A 08/29/2014   Procedure: ABDOMINAL EXPOSURE;  Surgeon: Rosetta Posner, MD;  Location: [place];  Service: Vascular;  Laterality: N/A;  . ABDOMINAL HYSTERECTOMY    . ANTERIOR LUMBAR FUSION N/A 08/29/2014   Procedure: ANTERIOR LUMBAR FUSION 1 LEVEL;  Surgeon: Sinclair Ship, MD;  Location: [facility];  Service: Orthopedics;  Laterality: N/A;  Lumbar 4-5 anterior lumbar interbody fusion with allograft and instrumentation.  . APPENDECTOMY    . BACK SURGERY  08/28/2014 and 08/29/2014   lumbar fusion  . CARPAL TUNNEL RELEASE Right    release done twice  . COLONOSCOPY    . JOINT REPLACEMENT    . left knee replacement    . REPLACEMENT TOTAL KNEE  2010   rigth knee  . TONSILLECTOMY    . TOTAL KNEE ARTHROPLASTY Left 02/15/2015    Procedure: TOTAL KNEE ARTHROPLASTY;  Surgeon: [name], MD;  Location: [place];  Service: Orthopedics;  Laterality: Left;    There were no vitals filed for this visit.      Subjective Assessment - 03/30/17 1110    Subjective The patient reports she is back to driving and picking up grandkids from school.  She brought a quad cane today--not one she uses often (had from back surgery).   Her balance is improving--she notes confidence she can go without the walker.   She cites endurance as main limiting factor.    Patient Stated Goals "Be able to use my left side withut falling, getting off of the walker, gaining strength."   Currently in Pain? Yes   Pain Score 5    Pain Location Back   Pain Orientation Lower   Pain Descriptors / Indicators Aching   Pain Type Chronic pain   Pain Onset More than a month ago   Pain Frequency Constant   Aggravating Factors  activity and standing aggravate pain   Pain Relieving Factors pain patch, rest            [facility] PT Assessment - 03/30/17 1115      Ambulation/Gait   Ambulation/Gait Yes   Ambulation/Gait Assistance 4:  Min guard   Ambulation/Gait Assistance Details CGA with cane x 4.5 minutes.    Ambulation Surface Level   Gait velocity 2.14 ft/sec   Stairs Yes     Standardized Balance Assessment   Standardized Balance Assessment Berg Balance Test     Berg Balance Test   Sit to Stand Able to stand without using hands and stabilize independently   Standing Unsupported Able to stand safely 2 minutes   Sitting with Back Unsupported but Feet Supported on Floor or Stool Able to sit safely and securely 2 minutes   Stand to Sit Sits safely with minimal use of hands   Transfers Able to transfer safely, minor use of hands   Standing Unsupported with Eyes Closed Able to stand 10 seconds safely   Standing Ubsupported with Feet Together Able to place feet together independently and stand 1 minute safely   From Standing, Reach Forward with  Outstretched Arm Can reach confidently >25 cm (10")   From Standing Position, Pick up Object from Floor Able to pick up shoe safely and easily   From Standing Position, Turn to Look Behind Over each Shoulder Looks behind from both sides and weight shifts well   Turn 360 Degrees Able to turn 360 degrees safely but slowly   Standing Unsupported, Alternately Place Feet on Step/Stool Able to complete 4 steps without aid or supervision   Standing Unsupported, One Foot in Front Able to take small step independently and hold 30 seconds   Standing on One Leg Tries to lift leg/unable to hold 3 seconds but remains standing independently   Total Score 47   Berg comment: 47/56 improved from 39/56 indicating reduced risk for falls.                     [facility] Adult PT Treatment/Exercise - 03/30/17 1115      Ambulation/Gait   Gait Comments Patient fatigues after 4.5 minutes noting increased back pain and knee discomfort.       Self-Care   Self-Care Other Self-Care Comments   Other Self-Care Comments  PT educated patient on safe use of single point cane and avoiding use of quad cane.  Discussed insurance authorization for cane and patient notes she does not think they will cover due to having rental w/c at home--PT recommended she call to turn in if she is not utilizing.      Exercises   Exercises Lumbar;Knee/Hip     Lumbar Exercises: Supine   Bridge 10 reps;3 seconds     Knee/Hip Exercises: Supine   Other Supine Knee/Hip Exercises Moving L LE onto/off of mat x 10 reps with tactille cues for hip flexion.   Other Supine Knee/Hip Exercises Supine marching with core stabilization.     Knee/Hip Exercises: Sidelying   Other Sidelying Knee/Hip Exercises PNF D1/D2 pattern with facilitation of L L [place] progressing to active movement adding some manual resistance.                 PT Education - 03/30/17 1915    Education provided Yes   Education Details safe use of assistive  device (recommended SPC over quad cane).  Also recommended she call [facility] as she no longer utilizes rental manual w/c   Person(s) Educated Patient   Methods Explanation   Comprehension Verbalized understanding          PT Short Term Goals - 03/30/17 1121      PT SHORT TERM GOAL #1  Title The patient will be indep with HEP for LE strength, balance and general mobility.   Baseline Met on 03/30/17   Time 4   Period Weeks   Status Achieved     PT SHORT TERM GOAL #2   Title The patient will improve Berg score from 39/56 to > or equal to 44/56 to demo improving standing balance.   Baseline Scores 47/56 on 03/30/17   Time 4   Period Weeks   Status Achieved     PT SHORT TERM GOAL #3   Title The patient will improve gait speed from 1.41 ft/sec to > or equal to 2.0 ft/sec without assistive device for improve mobility in the home.   Baseline Met on 03/30/17 scoring 2.14 ft/sec   Time 4   Period Weeks   Status Achieved     PT SHORT TERM GOAL #4   Title The patient will move floor<>stand with CGA with UE support due to recent falls.   Baseline Target date 03/31/2017   Time 4   Period Weeks   Status On-going           PT Long Term Goals - 03/01/17 1459      PT LONG TERM GOAL #1   Title The patient will improve SIS LE by 12% (baseline is 40.5%).   Baseline Target date 04/29/2017   Time 8   Period Weeks     PT LONG TERM GOAL #2   Title The patient will improve gait speed from 1.41 ft/sec to > or equal to 2.2 ft/sec to demo dec'ing risk for falls.   Baseline Target date 04/29/2017   Time 8   Period Weeks     PT LONG TERM GOAL #3   Title The patient will improve Berg from 39/56 to > or equal to 46/56 to demo dec'd risk for falls.   Baseline Target date 04/29/2017   Time 8   Period Weeks     PT LONG TERM GOAL #4   Title The patient will negotiate 4 steps without a handrail to negotiate home environment modified indep.   Baseline Target date 04/29/2017   Time 8    Period Weeks     PT LONG TERM GOAL #5   Title The patient will be indep with post d/c HEP.   Baseline Target date 04/29/2017   Time 8   Period Weeks               Plan - 03/30/17 1917    Clinical Impression Statement The patient has met 3/4 STGs.  PT to assess floor>stand transfer next visit.  Patient has only been seen for 3 visits due to other appointments, and availability in first 1-2 weeks of schedule.  She is showing progress by increasing activity in the home.  PT educating on safe progression of activities.    PT Treatment/Interventions ADLs/Self Care Home Management;Therapeutic activities;Therapeutic exercise;Neuromuscular re-education;Balance training;Gait training;Stair training;Functional mobility training;Patient/family education   PT Next Visit Plan Check HEP, LE strengthening, postural stability/core strength; L LE coorination and strengthening to improve foot clearance.    Consulted and Agree with Plan of Care Patient      Patient will benefit from skilled therapeutic intervention in order to improve the following deficits and impairments:  Abnormal gait, Decreased balance, Decreased mobility, Decreased strength, Pain, Difficulty walking, Decreased activity tolerance  Visit Diagnosis: Muscle weakness (generalized)  Unsteadiness on feet  Other abnormalities of gait and mobility     Problem List Patient Active  Problem List   Diagnosis Date Noted  . Chronic constipation   . Acute idiopathic gout involving toe of right foot   . Acute pain of right knee   . Failed back syndrome   . Pain   . Chronic pain syndrome   . Labile blood pressure   . Radicular pain   . Benign essential HTN   . Slow transit constipation   . Chronic bilateral low back pain without sciatica   . Dysphagia, post-stroke   . 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 Left hemiparesis ([place])   . Generalized weakness   . Acute ischemic stroke ([place]) 12/15/2016  . Urinary retention 02/25/2015   . Primary osteoarthritis of left knee 02/15/2015  . Morbid obesity ([place]) 02/15/2015  . Essential hypertension, benign 09/07/2014  . Constipation 09/07/2014  . Hyperlipidemia 09/05/2014  . Gout 09/05/2014  . Acute blood loss anemia 09/05/2014  . Neuropathy 09/05/2014  . UTI (urinary tract infection) 08/31/2014  . Radiculopathy 08/29/2014    WEAVER,CHRISTINA, PT 03/30/2017, 7:23 PM  [facility] [address] [place], [place], [address] Phone: [phone]   Fax:  [phone]  Name: DEONDRA LABRADOR MRN: [id number] Date of Birth: Aug 06, 1946

## 2017-03-30 NOTE — Therapy (Signed)
[facility] [address] [place], [place], [address] Phone: [phone]   Fax:  [phone]  Occupational Therapy Treatment  Patient Details  Name: Ellen Henry MRN: [id number] Date of Birth: 28-Mar-1946 Referring Provider: Dr. Leonie Man  Encounter Date: 03/30/2017      OT End of Session - 03/30/17 1127    Visit Number 3   Number of Visits 16   Date for OT Re-Evaluation 04/26/17   Authorization Type medicare will need G code and PN every 10th visit   Authorization Time Period 05/25/2017 (goals revised and resubmitted therefore new 60 day cycle)   Authorization - Visit Number 3   Authorization - Number of Visits 10   OT Start Time 1017   OT Stop Time 1101   OT Time Calculation (min) 44 min   Activity Tolerance Patient tolerated treatment well      Past Medical History:  Diagnosis Date  . Arthritis   . Dry skin   . GERD (gastroesophageal reflux disease)    "years ago", diet controlled  . Gout   . H/O pyelonephritis    as a child  . 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 Hypertension   . Neuropathy   . Pneumonia   . Radiculopathy   . Sleep apnea    does not use cpap  . Stroke [facility])     Past Surgical History:  Procedure Laterality Date  . ABDOMINAL EXPOSURE N/A 08/29/2014   Procedure: ABDOMINAL EXPOSURE;  Surgeon: Rosetta Posner, MD;  Location: [place];  Service: Vascular;  Laterality: N/A;  . ABDOMINAL HYSTERECTOMY    . ANTERIOR LUMBAR FUSION N/A 08/29/2014   Procedure: ANTERIOR LUMBAR FUSION 1 LEVEL;  Surgeon: Sinclair Ship, MD;  Location: [place];  Service: Orthopedics;  Laterality: N/A;  Lumbar 4-5 anterior lumbar interbody fusion with allograft and instrumentation.  . APPENDECTOMY    . BACK SURGERY  08/28/2014 and 08/29/2014   lumbar fusion  . CARPAL TUNNEL RELEASE Right    release done twice  . COLONOSCOPY    . JOINT REPLACEMENT    . left knee replacement    . REPLACEMENT TOTAL  KNEE  2010   rigth knee  . TONSILLECTOMY    . TOTAL KNEE ARTHROPLASTY Left 02/15/2015   Procedure: TOTAL KNEE ARTHROPLASTY;  Surgeon: [name], MD;  Location: [place] [facility];  Service: Orthopedics;  Laterality: Left;    There were no vitals filed for this visit.      Subjective Assessment - 03/30/17 1017    Subjective  I have been using a big water container with water in it to do my exercises.   Pertinent History pt with [facility] 12/15/2016;  pt completed inpt rehab as well as [facility].  Pt had back surgery in 2015 with residual pain.  Pt had fallen three times since discharge home.    Patient Stated Goals get back to doing things the way I did them before   Currently in Pain? Yes   Pain Score 5    Pain Location Back   Pain Orientation Lower   Pain Descriptors / Indicators Aching   Pain Onset More than a month ago   Pain Frequency Constant   Aggravating Factors  activity aggravates   Pain Relieving Factors pain patch, rest    Multiple Pain Sites Yes   Pain Score 4   Pain Location Shoulder   Pain Orientation Left   Pain  Descriptors / Indicators Aching   Pain Type Chronic pain   Pain Onset More than a month ago   Pain Frequency Constant   Aggravating Factors  If I do to much and if I have to reach back behind me it is a sharp pain   Pain Relieving Factors voltraine creme (pt had this imtermittently prior to stroke)    Pain Score 5   Pain Location Shoulder   Pain Orientation Right   Pain Descriptors / Indicators Aching   Pain Type Chronic pain   Pain Onset More than a month ago   Pain Frequency Constant   Aggravating Factors  I am using my R side more so it aggravates my arthritis   Pain Relieving Factors meds, pain patch                      OT Treatments/Exercises (OP) - 03/30/17 0001      ADLs   ADL Comments Pt arrived today with c/o bilateral pain in both shoulders.  Pt had some pain prior to stroke due to arthritis however feels this is worse in the LUE due to  stroke and now worse in RUE due to overuse.  Aftter discussion pt stated she was using a large water container with water in it to do her exercises.  Discussed how the program she was given was with a light beach ball and that given her h/o of systemic arthritis, pt should not be doing resistive exercises and explained rationale. Pt verbalized understanding.  Also reinforced bed positioning which pt is not doing consistently. Again pt verbalized understanding.  Checked all STG's - see goal status for updates.  Addressed modifying LTG's given pt's progress.  See upgraded LTG's.  Pt in agreement.       Neurological Re-education Exercises   Other Exercises 1 Addressed unilateral overhead reach with LUE in kitchen activities as well as addressing balance and weigh shifting thru functional activities.                 OT Education - 03/30/17 1113    Education provided Yes   Education Details reinforced HEP for LUE, safety with balance, bed positioning, use of heat and ice    Person(s) Educated Patient   Methods Explanation   Comprehension Verbalized understanding  pt as programs and was able to return demonstrate all information during prior sessions.          OT Short Term Goals - 03/30/17 1114      OT SHORT TERM GOAL #1   Title Pt will be mod I with HEP - 03/29/2017   Status Achieved     OT SHORT TERM GOAL #2   Title Pt will demonstrat improved coordination as evidenced by decreasing time on 9 hole peg test by at least 5 seconds (baseline R= 29.53, L=52.25)   Baseline 03/30/17 R= 23.06, L = 35.93 see goal for eval baseline   Status Achieved  03/30/2017 R= 23.06, L = 35.93     OT SHORT TERM GOAL #3   Title Pt will demostrate ability to use LUE as non dominant 75% of the time for ADL and IADL tasks   Status Achieved     OT SHORT TERM GOAL #5   Title Pt will demonstrate ability to pick up 2 pound object off shelf x3 without dropping   Baseline 03/30/2017 able to lift 3 pound object  x3     Status Achieved     OT  SHORT TERM GOAL #6   Title Pt will be mod I with UB bathing AE prn   Status Achieved           OT Long Term Goals - 03/30/17 1116      OT LONG TERM GOAL #1   Title Pt will be mod I with upgraded HEP - 04/26/2017   Status On-going     OT LONG TERM GOAL #2   Title Pt will demonstrate improved coordination as evidenced by decreasing time on 9 hole peg by decreasing 20 seconds for LUE to assist with functional tasks (L=52.23)   Status Revised     OT LONG TERM GOAL #3   Title Pt will be able to complete fine motor tasks bilaterally at mid reach for activity (using LUE as non dominant)   Status On-going     OT LONG TERM GOAL #4   Title Pt will be mod I with cooking and laundry without device and no LOB   Status Revised     OT LONG TERM GOAL #5   Title Pt will be able to lift 4 pound weight off overhead shelf x3 without dropping   Status On-going     OT LONG TERM GOAL #6   Title Pt will demonstrate improved grip strength by at least 10 pounds in LUE to assist with functional tasks (baseline= 35)   Baseline 03/30/2017 43 pounds   Status Revised     OT LONG TERM GOAL #7   Title Pt will use LUE as non dominant 95% of the time in ADL and I ADL tasks.   Status On-going     OT LONG TERM GOAL #8   Title Pt will tolerate at least 40 minutes of activity at ambulatory level without requiring a rest break.   Status Achieved     OT LONG TERM GOAL  #9   Baseline Pt will be able to lift 6 pound object from low surface to counter height using BUE's (i.e lifting casserole out of oven)   Status New     OT LONG TERM GOAL  #10   TITLE Pt will be able to unhook shower curtain rings to take down shower curtain   Status New               Plan - 03/30/17 1119    Clinical Impression Statement Pt progressing toward goals. Pt has made significant functional progress   Rehab Potential Excellent   OT Frequency 2x / week   OT Duration 8 weeks   OT  Treatment/Interventions Self-care/ADL training;Therapeutic exercise;Neuromuscular education;Energy conservation;DME and/or AE instruction;Manual Therapy;Therapist, nutritional;Therapeutic activities;Patient/family education;Balance training   Plan check HEP, address pain in both shoulders, address abduction/extension with LUE, balance. coordination   Consulted and Agree with Plan of Care Patient      Patient will benefit from skilled therapeutic intervention in order to improve the following deficits and impairments:  Decreased activity tolerance, Decreased balance, Decreased coordination, Decreased strength, Difficulty walking, Impaired UE functional use, Impaired tone  Visit Diagnosis: Muscle weakness (generalized) - Plan: Ot plan of care cert/re-cert  Unsteadiness on feet - Plan: Ot plan of care cert/re-cert  Hemiplegia and hemiparesis following cerebral infarction affecting left non-dominant side ([place]) - Plan: Ot plan of care cert/re-cert  Other lack of coordination - Plan: Ot plan of care cert/re-cert  Abnormal posture - Plan: Ot plan of care cert/re-cert  Other symptoms and signs involving the nervous system - Plan: Ot plan of care cert/re-cert  Problem List Patient Active Problem List   Diagnosis Date Noted  . Chronic constipation   . Acute idiopathic gout involving toe of right foot   . Acute pain of right knee   . Failed back syndrome   . Pain   . Chronic pain syndrome   . Labile blood pressure   . Radicular pain   . Benign essential HTN   . Slow transit constipation   . Chronic bilateral low back pain without sciatica   . Dysphagia, post-stroke   . 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 Left hemiparesis ([place])   . Generalized weakness   . Acute ischemic stroke ([place]) 12/15/2016  . Urinary retention 02/25/2015  . Primary osteoarthritis of left knee 02/15/2015  . Morbid obesity ([place]) 02/15/2015  . Essential hypertension, benign 09/07/2014  . Constipation  09/07/2014  . Hyperlipidemia 09/05/2014  . Gout 09/05/2014  . Acute blood loss anemia 09/05/2014  . Neuropathy 09/05/2014  . UTI (urinary tract infection) 08/31/2014  . Radiculopathy 08/29/2014    Quay Burow , OTR/L 03/30/2017, 11:33 AM  [facility] [address] [place], [place], [address] Phone: [phone]   Fax:  [phone]  Name: Ellen Henry MRN: [id number] Date of Birth: [birth year]

## 2017-03-30 NOTE — Patient Instructions (Signed)
Reinforced following HEP exactly as prescribed for gentle strengthening;  Reinforced bed positioning for shoulder and back discomfort; discussed use of heat prior to gentle HEP followed by ice;  Reinforced use of device at home to decrease abnormal gait pattern which is likely aggravating back pain.

## 2017-04-01 ENCOUNTER — Ambulatory Visit: Payer: Medicare Other | Admitting: Rehabilitative and Restorative Service Providers"

## 2017-04-01 VITALS — BP 96/65 | HR 86

## 2017-04-01 DIAGNOSIS — R29818 Other symptoms and signs involving the nervous system: Secondary | ICD-10-CM | POA: Diagnosis not present

## 2017-04-01 DIAGNOSIS — I69354 Hemiplegia and hemiparesis following cerebral infarction affecting left non-dominant side: Secondary | ICD-10-CM | POA: Diagnosis not present

## 2017-04-01 DIAGNOSIS — M6281 Muscle weakness (generalized): Secondary | ICD-10-CM

## 2017-04-01 DIAGNOSIS — R2689 Other abnormalities of gait and mobility: Secondary | ICD-10-CM | POA: Diagnosis not present

## 2017-04-01 DIAGNOSIS — R293 Abnormal posture: Secondary | ICD-10-CM | POA: Diagnosis not present

## 2017-04-01 DIAGNOSIS — R2681 Unsteadiness on feet: Secondary | ICD-10-CM

## 2017-04-01 DIAGNOSIS — R278 Other lack of coordination: Secondary | ICD-10-CM | POA: Diagnosis not present

## 2017-04-01 NOTE — Therapy (Signed)
[facility] [address] [place], [place], [address] Phone: [phone]   Fax:  [phone]  Physical Therapy Treatment  Patient Details  Name: Ellen Henry MRN: [id number] Date of Birth: 07-24-1946 Referring Provider: Antony Contras, MD  Encounter Date: 04/01/2017      PT End of Session - 04/01/17 1121    Visit Number 4   Number of Visits 16   Date for PT Re-Evaluation 04/30/17   Authorization Type G code every 10th visit   PT Start Time 1110   PT Stop Time 1150   PT Time Calculation (min) 40 min   Equipment Utilized During Treatment Gait belt   Activity Tolerance Patient tolerated treatment well   Behavior During Therapy [facility] for tasks assessed/performed      Past Medical History:  Diagnosis Date  . Arthritis   . Dry skin   . GERD (gastroesophageal reflux disease)    "years ago", diet controlled  . Gout   . H/O pyelonephritis    as a child  . 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 Hypertension   . Neuropathy   . Pneumonia   . Radiculopathy   . Sleep apnea    does not use cpap  . Stroke [facility])     Past Surgical History:  Procedure Laterality Date  . ABDOMINAL EXPOSURE N/A 08/29/2014   Procedure: ABDOMINAL EXPOSURE;  Surgeon: Rosetta Posner, MD;  Location: [facility];  Service: Vascular;  Laterality: N/A;  . ABDOMINAL HYSTERECTOMY    . ANTERIOR LUMBAR FUSION N/A 08/29/2014   Procedure: ANTERIOR LUMBAR FUSION 1 LEVEL;  Surgeon: Sinclair Ship, MD;  Location: [place];  Service: Orthopedics;  Laterality: N/A;  Lumbar 4-5 anterior lumbar interbody fusion with allograft and instrumentation.  . APPENDECTOMY    . BACK SURGERY  08/28/2014 and 08/29/2014   lumbar fusion  . CARPAL TUNNEL RELEASE Right    release done twice  . COLONOSCOPY    . JOINT REPLACEMENT    . left knee replacement    . REPLACEMENT TOTAL KNEE  2010   rigth knee  . TONSILLECTOMY    . TOTAL KNEE ARTHROPLASTY Left 02/15/2015    Procedure: TOTAL KNEE ARTHROPLASTY;  Surgeon: [name], MD;  Location: [facility] [place];  Service: Orthopedics;  Laterality: Left;    Vitals:   04/01/17 1112  BP: 96/65  Pulse: 86        Subjective Assessment - 04/01/17 1112    Subjective The patient reports she is a little sore in the right hip from exercises.  She arrived to therapy using quad cane again.  Her back pain is 4/10 at rest.    Patient Stated Goals "Be able to use my left side withut falling, getting off of the walker, gaining strength."   Currently in Pain? Yes   Pain Score 4    Pain Location Back   Pain Orientation Lower   Pain Onset More than a month ago   Effect of Pain on Daily Activities PT to monitor, no goal to follow due to chronic nature of pain.                         [facility] Adult PT Treatment/Exercise - 04/01/17 1142      Ambulation/Gait   Ambulation/Gait Yes   Ambulation/Gait Assistance 4: Min guard   Ambulation/Gait Assistance Details CGA with intermittent L foot catch (x 4 episodes today)  requiring CGA for support   Ambulation Distance (Feet) 200 Feet  115 x 3 reps   Assistive device None  arrives with SBQC (tips are worn through and she carries)   Gait Pattern Decreased stride length;Decreased arm swing - right;Decreased arm swing - left;Decreased step length - right;Decreased step length - left;Decreased stance time - left;Decreased weight shift to left;Antalgic   Ambulation Surface Level   Gait Comments Emphasized L heel strike to improve L foot clearance during gait activities.      Neuro Re-ed    Neuro Re-ed Details  Cone tapping with R and L LEs emphasizing coordination of the L leg moving between multi-height surfaces.  Also perfomred tapping to smaller targets as L LE was knocking cones over each time.  Provided UE support at countertop and CGA for safety.      Exercises   Exercises Knee/Hip;Ankle     Knee/Hip Exercises: Aerobic   Stepper x 5 minutes initially beginning  without resistance x 2 minutes, then level 3 x 3 minutes.  Patient fatigues quickly with endurance activities.     Knee/Hip Exercises: Standing   Hip Flexion Stengthening;Right;Left;10 reps   Hip Flexion Limitations holding at counter for UE support    Hip Abduction AROM;Stengthening;Right;Left;10 reps   Abduction Limitations holding at counter for UE support    Gait Training Performed backwards walking x 10 feet x 4 reps near countertop with CGA;  Performed toe walking x 10 feet x 4 reps with min A for safety for strengthening/balance.   Other Standing Knee Exercises Standing moving contralateral LE into hip abduction (beginning with knee flexed for lateral hip stabilization) x 10 reps each side.     Knee/Hip Exercises: Seated   Sit to Sand without UE support;10 reps     Ankle Exercises: Standing   Heel Raises 10 reps   Heel Raises Limitations with UE support   Toe Raise 10 reps   Toe Raise Limitations with UE support:  worked to decrease reliance on UEs with PT providing CGA                  PT Short Term Goals - 03/30/17 1121      PT SHORT TERM GOAL #1   Title The patient will be indep with HEP for LE strength, balance and general mobility.   Baseline Met on 03/30/17   Time 4   Period Weeks   Status Achieved     PT SHORT TERM GOAL #2   Title The patient will improve Berg score from 39/56 to > or equal to 44/56 to demo improving standing balance.   Baseline Scores 47/56 on 03/30/17   Time 4   Period Weeks   Status Achieved     PT SHORT TERM GOAL #3   Title The patient will improve gait speed from 1.41 ft/sec to > or equal to 2.0 ft/sec without assistive device for improve mobility in the home.   Baseline Met on 03/30/17 scoring 2.14 ft/sec   Time 4   Period Weeks   Status Achieved     PT SHORT TERM GOAL #4   Title The patient will move floor<>stand with CGA with UE support due to recent falls.   Baseline Target date 03/31/2017   Time 4   Period Weeks   Status  On-going           PT Long Term Goals - 03/01/17 1459      PT LONG TERM GOAL #1  Title The patient will improve SIS LE by 12% (baseline is 40.5%).   Baseline Target date 04/29/2017   Time 8   Period Weeks     PT LONG TERM GOAL #2   Title The patient will improve gait speed from 1.41 ft/sec to > or equal to 2.2 ft/sec to demo dec'ing risk for falls.   Baseline Target date 04/29/2017   Time 8   Period Weeks     PT LONG TERM GOAL #3   Title The patient will improve Berg from 39/56 to > or equal to 46/56 to demo dec'd risk for falls.   Baseline Target date 04/29/2017   Time 8   Period Weeks     PT LONG TERM GOAL #4   Title The patient will negotiate 4 steps without a handrail to negotiate home environment modified indep.   Baseline Target date 04/29/2017   Time 8   Period Weeks     PT LONG TERM GOAL #5   Title The patient will be indep with post d/c HEP.   Baseline Target date 04/29/2017   Time 8   Period Weeks               Plan - 04/01/17 2218    Clinical Impression Statement The patient is limited by endurance during standing strengthening and gait activiites.  PT to continue to progress to patient tolerance.     PT Treatment/Interventions ADLs/Self Care Home Management;Therapeutic activities;Therapeutic exercise;Neuromuscular re-education;Balance training;Gait training;Stair training;Functional mobility training;Patient/family education   PT Next Visit Plan Check HEP, LE strengthening, postural stability/core strength; L LE coorination and strengthening to improve foot clearance.    Consulted and Agree with Plan of Care Patient      Patient will benefit from skilled therapeutic intervention in order to improve the following deficits and impairments:  Abnormal gait, Decreased balance, Decreased mobility, Decreased strength, Pain, Difficulty walking, Decreased activity tolerance  Visit Diagnosis: Muscle weakness (generalized)  Unsteadiness on feet  Other  abnormalities of gait and mobility     Problem List Patient Active Problem List   Diagnosis Date Noted  . Chronic constipation   . Acute idiopathic gout involving toe of right foot   . Acute pain of right knee   . Failed back syndrome   . Pain   . Chronic pain syndrome   . Labile blood pressure   . Radicular pain   . Benign essential HTN   . Slow transit constipation   . Chronic bilateral low back pain without sciatica   . Dysphagia, post-stroke   . 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 Left hemiparesis ([place])   . Generalized weakness   . Acute ischemic stroke ([place]) 12/15/2016  . Urinary retention 02/25/2015  . Primary osteoarthritis of left knee 02/15/2015  . Morbid obesity ([place]) 02/15/2015  . Essential hypertension, benign 09/07/2014  . Constipation 09/07/2014  . Hyperlipidemia 09/05/2014  . Gout 09/05/2014  . Acute blood loss anemia 09/05/2014  . Neuropathy 09/05/2014  . UTI (urinary tract infection) 08/31/2014  . Radiculopathy 08/29/2014    WEAVER,CHRISTINA, PT 04/01/2017, 10:19 PM  [facility] [address] [place], [place], [address] Phone: [phone]   Fax:  [phone]  Name: Ellen Henry MRN: [id number] Date of Birth: 1946/08/28

## 2017-04-06 ENCOUNTER — Encounter: Payer: Self-pay | Admitting: Occupational Therapy

## 2017-04-06 ENCOUNTER — Ambulatory Visit: Payer: Medicare Other | Admitting: Physical Therapy

## 2017-04-06 ENCOUNTER — Encounter: Payer: Self-pay | Admitting: Physical Therapy

## 2017-04-06 ENCOUNTER — Ambulatory Visit: Payer: Medicare Other | Attending: Neurology | Admitting: Occupational Therapy

## 2017-04-06 DIAGNOSIS — R293 Abnormal posture: Secondary | ICD-10-CM | POA: Diagnosis not present

## 2017-04-06 DIAGNOSIS — R278 Other lack of coordination: Secondary | ICD-10-CM

## 2017-04-06 DIAGNOSIS — R29818 Other symptoms and signs involving the nervous system: Secondary | ICD-10-CM | POA: Diagnosis not present

## 2017-04-06 DIAGNOSIS — M6281 Muscle weakness (generalized): Secondary | ICD-10-CM | POA: Diagnosis not present

## 2017-04-06 DIAGNOSIS — I69354 Hemiplegia and hemiparesis following cerebral infarction affecting left non-dominant side: Secondary | ICD-10-CM

## 2017-04-06 DIAGNOSIS — R2681 Unsteadiness on feet: Secondary | ICD-10-CM | POA: Diagnosis not present

## 2017-04-06 DIAGNOSIS — R2689 Other abnormalities of gait and mobility: Secondary | ICD-10-CM | POA: Diagnosis not present

## 2017-04-06 NOTE — Therapy (Signed)
[facility] [address] [place], [place], [address] Phone: [phone]   Fax:  [phone]  Physical Therapy Treatment  Patient Details  Name: Ellen Henry MRN: [id number] Date of Birth: [birth year] Referring Provider: Antony Contras, MD  Encounter Date: 04/06/2017      PT End of Session - 04/06/17 1026    Visit Number 5   Number of Visits 16   Date for PT Re-Evaluation 04/30/17   Authorization Type G code every 10th visit   PT Start Time 1018   PT Stop Time 1100   PT Time Calculation (min) 42 min   Equipment Utilized During Treatment Gait belt   Activity Tolerance Patient tolerated treatment well   Behavior During Therapy [facility] for tasks assessed/performed      Past Medical History:  Diagnosis Date  . Arthritis   . Dry skin   . GERD (gastroesophageal reflux disease)    "years ago", diet controlled  . Gout   . H/O pyelonephritis    as a child  . 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 Hypertension   . Neuropathy   . Pneumonia   . Radiculopathy   . Sleep apnea    does not use cpap  . Stroke [facility])     Past Surgical History:  Procedure Laterality Date  . ABDOMINAL EXPOSURE N/A 08/29/2014   Procedure: ABDOMINAL EXPOSURE;  Surgeon: Rosetta Posner, MD;  Location: [place];  Service: Vascular;  Laterality: N/A;  . ABDOMINAL HYSTERECTOMY    . ANTERIOR LUMBAR FUSION N/A 08/29/2014   Procedure: ANTERIOR LUMBAR FUSION 1 LEVEL;  Surgeon: Sinclair Ship, MD;  Location: [place];  Service: Orthopedics;  Laterality: N/A;  Lumbar 4-5 anterior lumbar interbody fusion with allograft and instrumentation.  . APPENDECTOMY    . BACK SURGERY  08/28/2014 and 08/29/2014   lumbar fusion  . CARPAL TUNNEL RELEASE Right    release done twice  . COLONOSCOPY    . JOINT REPLACEMENT    . left knee replacement    . REPLACEMENT TOTAL KNEE  2010   rigth knee  . TONSILLECTOMY    . TOTAL KNEE ARTHROPLASTY Left 02/15/2015    Procedure: TOTAL KNEE ARTHROPLASTY;  Surgeon: [name], MD;  Location: [place];  Service: Orthopedics;  Laterality: Left;    There were no vitals filed for this visit.      Subjective Assessment - 04/06/17 1022    Subjective Had a fall last Monday (week ago yesterday). She was using cane (quad cane) and going down the stairs. Lost her balance and fell to her left. Landed on ground/wet grass, not concrete, on her back. Denies any injury, just a little more soreness than usual in her upper back/shoulder area. Using her RW since then.                           Patient Stated Goals "Be able to use my left side withut falling, getting off of the walker, gaining strength."   Currently in Pain? Yes   Pain Score 4    Pain Location Back   Pain Orientation Upper;Mid;Lower   Pain Descriptors / Indicators Aching;Sore   Pain Type Chronic pain;Acute pain  chronic-lower back, upper/mid-from fall   Pain Onset More than a month ago   Pain Frequency Constant   Aggravating Factors  lower back: increased activity, standing. upper/mid back- from recent fall  Pain Relieving Factors pain patch, rest            [facility] Adult PT Treatment/Exercise - 04/06/17 1028      Transfers   Transfers Sit to Stand;Stand to Sit   Sit to Stand 6: Modified independent (Device/Increase time);With upper extremity assist;From bed;From chair/3-in-1   Stand to Sit 6: Modified independent (Device/Increase time);With upper extremity assist;To bed;To chair/3-in-1     Ambulation/Gait   Ambulation/Gait Yes   Ambulation/Gait Assistance 6: Modified independent (Device/Increase time);4: Min guard;4: Min assist   Ambulation/Gait Assistance Details Mod I with RW into/out of gym. min assist downgrading to min guard assist for balance with cues on posture, cane placement and weight shifitng   Ambulation Distance (Feet) 100 Feet  x2 RW; 115 x2 with cane   Assistive device Rolling walker;Straight cane  straight cane with rubber  quad tip   Gait Pattern Decreased stride length;Decreased arm swing - right;Decreased arm swing - left;Decreased step length - right;Decreased step length - left;Decreased stance time - left;Decreased weight shift to left;Antalgic   Ambulation Surface Level;Indoor     High Level Balance   High Level Balance Activities Side stepping;Marching forwards;Marching backwards;Tandem walking  tandem gait fwd/bwd   High Level Balance Comments at counter top: no UE support with side stepping, single UE support with others. min guard to min assist for balance.  cues on posture and correct ex form/technique     Lumbar Exercises: Aerobic   Stationary Bike Scifit level 1.2 x 8 minutes with bil UE/LE's for strengthening and activity tolerance           PT Education - 04/06/17 1056    Education provided Yes   Education Details use of SPC vs quad cane; rubber quad tip for straight cane (SPC) and where to purchase one.    Person(s) Educated Patient   Methods Explanation;Demonstration;Verbal cues;Handout   Comprehension Verbalized understanding;Verbal cues required;Need further instruction          PT Short Term Goals - 03/30/17 1121      PT SHORT TERM GOAL #1   Title The patient will be indep with HEP for LE strength, balance and general mobility.   Baseline Met on 03/30/17   Time 4   Period Weeks   Status Achieved     PT SHORT TERM GOAL #2   Title The patient will improve Berg score from 39/56 to > or equal to 44/56 to demo improving standing balance.   Baseline Scores 47/56 on 03/30/17   Time 4   Period Weeks   Status Achieved     PT SHORT TERM GOAL #3   Title The patient will improve gait speed from 1.41 ft/sec to > or equal to 2.0 ft/sec without assistive device for improve mobility in the home.   Baseline Met on 03/30/17 scoring 2.14 ft/sec   Time 4   Period Weeks   Status Achieved     PT SHORT TERM GOAL #4   Title The patient will move floor<>stand with CGA with UE support due to  recent falls.   Baseline Target date 03/31/2017   Time 4   Period Weeks   Status On-going           PT Long Term Goals - 03/01/17 1459      PT LONG TERM GOAL #1   Title The patient will improve SIS LE by 12% (baseline is 40.5%).   Baseline Target date 04/29/2017   Time 8   Period Weeks  PT LONG TERM GOAL #2   Title The patient will improve gait speed from 1.41 ft/sec to > or equal to 2.2 ft/sec to demo dec'ing risk for falls.   Baseline Target date 04/29/2017   Time 8   Period Weeks     PT LONG TERM GOAL #3   Title The patient will improve Berg from 39/56 to > or equal to 46/56 to demo dec'd risk for falls.   Baseline Target date 04/29/2017   Time 8   Period Weeks     PT LONG TERM GOAL #4   Title The patient will negotiate 4 steps without a handrail to negotiate home environment modified indep.   Baseline Target date 04/29/2017   Time 8   Period Weeks     PT LONG TERM GOAL #5   Title The patient will be indep with post d/c HEP.   Baseline Target date 04/29/2017   Time 8   Period Weeks           Plan - 04/06/17 1026    Clinical Impression Statement Today's skilled session addressed safe use of recommended AD (straight cane) and once again to not use quad cane due to fall risk. Also worked on [employer]. Pt is making steady progress toward goals and pt should benefit from continued PT to progress toward unmet goals.    PT Treatment/Interventions ADLs/Self Care Home Management;Therapeutic activities;Therapeutic exercise;Neuromuscular re-education;Balance training;Gait training;Stair training;Functional mobility training;Patient/family education   PT Next Visit Plan continue to work on gait with cane, balance activities, activity tolerance   Consulted and Agree with Plan of Care Patient      Patient will benefit from skilled therapeutic intervention in order to improve the following deficits and impairments:  Abnormal gait, Decreased balance, Decreased  mobility, Decreased strength, Pain, Difficulty walking, Decreased activity tolerance  Visit Diagnosis: Muscle weakness (generalized)  Unsteadiness on feet  Hemiplegia and hemiparesis following cerebral infarction affecting left non-dominant side (HCC)  Other lack of coordination  Other abnormalities of gait and mobility     Problem List Patient Active Problem List   Diagnosis Date Noted  . Chronic constipation   . Acute idiopathic gout involving toe of right foot   . Acute pain of right knee   . Failed back syndrome   . Pain   . Chronic pain syndrome   . Labile blood pressure   . Radicular pain   . Benign essential HTN   . Slow transit constipation   . Chronic bilateral low back pain without sciatica   . Dysphagia, post-stroke   . 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 Left hemiparesis ([place])   . Generalized weakness   . Acute ischemic stroke ([place]) 12/15/2016  . Urinary retention 02/25/2015  . Primary osteoarthritis of left knee 02/15/2015  . Morbid obesity ([place]) 02/15/2015  . Essential hypertension, benign 09/07/2014  . Constipation 09/07/2014  . Hyperlipidemia 09/05/2014  . Gout 09/05/2014  . Acute blood loss anemia 09/05/2014  . Neuropathy 09/05/2014  . UTI (urinary tract infection) 08/31/2014  . Radiculopathy 08/29/2014    Willow Ora, PTA, [facility] Outpatient Neuro [facility] [address] [place], [place] [address] [phone] 04/06/17, 9:55 PM   Name: Ellen Henry MRN: [id number] Date of Birth: [birth year]

## 2017-04-06 NOTE — Therapy (Signed)
[facility] [address] [place], [place], [address] Phone: [phone]   Fax:  [phone]  Occupational Therapy Treatment  Patient Details  Name: Ellen Henry MRN: [id number] Date of Birth: [birth year] Referring Provider: Dr. Leonie Man  Encounter Date: 04/06/2017      OT End of Session - 04/06/17 1248    Visit Number 4   Number of Visits 16   Date for OT Re-Evaluation 04/26/17   Authorization Type medicare will need G code and PN every 10th visit   Authorization Time Period 05/25/2017 (goals revised and resubmitted therefore new 60 day cycle)   Authorization - Visit Number 4   Authorization - Number of Visits 10   OT Start Time 1100   OT Stop Time 1143   OT Time Calculation (min) 43 min   Activity Tolerance Patient tolerated treatment well      Past Medical History:  Diagnosis Date  . Arthritis   . Dry skin   . GERD (gastroesophageal reflux disease)    "years ago", diet controlled  . Gout   . H/O pyelonephritis    as a child  . 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 Hypertension   . Neuropathy   . Pneumonia   . Radiculopathy   . Sleep apnea    does not use cpap  . Stroke [facility])     Past Surgical History:  Procedure Laterality Date  . ABDOMINAL EXPOSURE N/A 08/29/2014   Procedure: ABDOMINAL EXPOSURE;  Surgeon: Rosetta Posner, MD;  Location: [place];  Service: Vascular;  Laterality: N/A;  . ABDOMINAL HYSTERECTOMY    . ANTERIOR LUMBAR FUSION N/A 08/29/2014   Procedure: ANTERIOR LUMBAR FUSION 1 LEVEL;  Surgeon: Sinclair Ship, MD;  Location: [place];  Service: Orthopedics;  Laterality: N/A;  Lumbar 4-5 anterior lumbar interbody fusion with allograft and instrumentation.  . APPENDECTOMY    . BACK SURGERY  08/28/2014 and 08/29/2014   lumbar fusion  . CARPAL TUNNEL RELEASE Right    release done twice  . COLONOSCOPY    . JOINT REPLACEMENT    . left knee replacement    . REPLACEMENT TOTAL  KNEE  2010   rigth knee  . TONSILLECTOMY    . TOTAL KNEE ARTHROPLASTY Left 02/15/2015   Procedure: TOTAL KNEE ARTHROPLASTY;  Surgeon: [name], MD;  Location: [place];  Service: Orthopedics;  Laterality: Left;    There were no vitals filed for this visit.      Subjective Assessment - 04/06/17 1110    Pertinent History pt with [facility] 12/15/2016;  pt completed inpt rehab as well as [facility] PT and OT.  Pt had back surgery in 2015 with residual pain.  Pt had fallen three times since discharge home.    Patient Stated Goals get back to doing things the way I did them before   Currently in Pain? Yes   Pain Score 4    Pain Location Back   Pain Orientation Upper;Mid;Lower   Pain Descriptors / Indicators Aching;Sore   Pain Type Chronic pain;Acute pain   Pain Onset More than a month ago   Pain Frequency Constant   Aggravating Factors  lower back, increased activity, standing, upper/mid back - from recent fall   Pain Relieving Factors pain patch, rest   Effect of Pain on Daily Activities PT to monitor, no goal to follow due to chronic nature of pain.   Multiple Pain Sites Yes  Pain Score 4   Pain Location Shoulder   Pain Orientation Left   Pain Descriptors / Indicators Aching   Pain Type Chronic pain   Pain Onset More than a month ago   Pain Frequency Constant   Aggravating Factors  recent fall - pt states it isn't bad just a twinge.   Pain Relieving Factors pain patch, rest.                        OT Treatments/Exercises (OP) - 04/06/17 1115      ADLs   ADL Comments Discussed POC as pt has missed several appts. Pt revealed that she is concerned about the copay movng foward.  After discussion pt wishes to decrease frequency to 1x/wk for OT and PT.  Appts adjusted and pt provided with new schedule by front office staff.       Exercises   Exercises Hand     Hand Exercises   Hand Gripper with Small Beads Gripper on #2 picking up 1 inch blocks to address grip strength and  coordination with min difficulty. Pt needed 1 rest break to complete task.       Fine Motor Coordination   Other Fine Motor Exercises Addressed fine motor coordination and in hand manipulation with L hand with functional tasks.  Pt with min difficulty. Pt needs cues to manipulate objects in hand vs using arm exaggerated arm movements to compensate. Pt with increased incoordination as hand/arm fatigues.                   OT Short Term Goals - 04/06/17 1246      OT SHORT TERM GOAL #1   Title Pt will be mod I with HEP - 03/29/2017   Status Achieved     OT SHORT TERM GOAL #2   Title Pt will demonstrat improved coordination as evidenced by decreasing time on 9 hole peg test by at least 5 seconds (baseline R= 29.53, L=52.25)   Baseline 03/30/17 R= 23.06, L = 35.93 see goal for eval baseline   Status Achieved  03/30/2017 R= 23.06, L = 35.93     OT SHORT TERM GOAL #3   Title Pt will demostrate ability to use LUE as non dominant 75% of the time for ADL and IADL tasks   Status Achieved     OT SHORT TERM GOAL #5   Title Pt will demonstrate ability to pick up 2 pound object off shelf x3 without dropping   Baseline 03/30/2017 able to lift 3 pound object x3     Status Achieved     OT SHORT TERM GOAL #6   Title Pt will be mod I with UB bathing AE prn   Status Achieved           OT Long Term Goals - 04/06/17 1246      OT LONG TERM GOAL #1   Title Pt will be mod I with upgraded HEP - 04/26/2017   Status On-going     OT LONG TERM GOAL #2   Title Pt will demonstrate improved coordination as evidenced by decreasing time on 9 hole peg by decreasing 20 seconds for LUE to assist with functional tasks (L=52.23)   Status On-going     OT LONG TERM GOAL #3   Title Pt will be able to complete fine motor tasks bilaterally at mid reach for activity (using LUE as non dominant)   Status On-going     OT  LONG TERM GOAL #4   Title Pt will be mod I with cooking and laundry without device and no  LOB   Status On-going     OT LONG TERM GOAL #5   Title Pt will be able to lift 4 pound weight off overhead shelf x3 without dropping   Status On-going     OT LONG TERM GOAL #6   Title Pt will demonstrate improved grip strength by at least 10 pounds in LUE to assist with functional tasks (baseline= 35)   Baseline 03/30/2017 43 pounds   Status On-going     OT LONG TERM GOAL #7   Title Pt will use LUE as non dominant 95% of the time in ADL and I ADL tasks.   Status On-going     OT LONG TERM GOAL #8   Title Pt will tolerate at least 40 minutes of activity at ambulatory level without requiring a rest break.   Status Achieved     OT LONG TERM GOAL  #9   Baseline Pt will be able to lift 6 pound object from low surface to counter height using BUE's (i.e lifting casserole out of oven)   Status On-going     OT LONG TERM GOAL  #10   TITLE Pt will be able to unhook shower curtain rings to take down shower curtain   Status On-going               Plan - 04/06/17 1247    Clinical Impression Statement Pt progressing toward goals. Pt with fall this week and is now currently using walker again.  Pt states "I shouldn't have been using the cane."    Rehab Potential Excellent   OT Frequency 2x / week   OT Duration 8 weeks   OT Treatment/Interventions Self-care/ADL training;Therapeutic exercise;Neuromuscular education;Energy conservation;DME and/or AE instruction;Manual Therapy;Therapist, nutritional;Therapeutic activities;Patient/family education;Balance training   Plan address pain in LUE for abduction, ER, extension.  balance, functional mobility, coordination,    Consulted and Agree with Plan of Care Patient      Patient will benefit from skilled therapeutic intervention in order to improve the following deficits and impairments:  Decreased activity tolerance, Decreased balance, Decreased coordination, Decreased strength, Difficulty walking, Impaired UE functional use, Impaired  tone  Visit Diagnosis: Muscle weakness (generalized)  Unsteadiness on feet  Hemiplegia and hemiparesis following cerebral infarction affecting left non-dominant side (HCC)  Other lack of coordination  Abnormal posture  Other symptoms and signs involving the nervous system    Problem List Patient Active Problem List   Diagnosis Date Noted  . Chronic constipation   . Acute idiopathic gout involving toe of right foot   . Acute pain of right knee   . Failed back syndrome   . Pain   . Chronic pain syndrome   . Labile blood pressure   . Radicular pain   . Benign essential HTN   . Slow transit constipation   . Chronic bilateral low back pain without sciatica   . Dysphagia, post-stroke   . 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 Left hemiparesis ([place])   . Generalized weakness   . Acute ischemic stroke ([place]) 12/15/2016  . Urinary retention 02/25/2015  . Primary osteoarthritis of left knee 02/15/2015  . Morbid obesity ([place]) 02/15/2015  . Essential hypertension, benign 09/07/2014  . Constipation 09/07/2014  . Hyperlipidemia 09/05/2014  . Gout 09/05/2014  . Acute blood loss anemia 09/05/2014  . Neuropathy 09/05/2014  . UTI (urinary tract infection) 08/31/2014  .  Radiculopathy 08/29/2014    Quay Burow, OTR/L 04/06/2017, 12:50 PM  [facility] [address] [place], [place], [address] Phone: [phone]   Fax:  [phone]  Name: Ellen Henry MRN: [id number] Date of Birth: Aug 20, 1946

## 2017-04-09 ENCOUNTER — Ambulatory Visit: Payer: Medicare Other | Admitting: Rehabilitative and Restorative Service Providers"

## 2017-04-09 DIAGNOSIS — R2689 Other abnormalities of gait and mobility: Secondary | ICD-10-CM | POA: Diagnosis not present

## 2017-04-09 DIAGNOSIS — R278 Other lack of coordination: Secondary | ICD-10-CM | POA: Diagnosis not present

## 2017-04-09 DIAGNOSIS — R29818 Other symptoms and signs involving the nervous system: Secondary | ICD-10-CM | POA: Diagnosis not present

## 2017-04-09 DIAGNOSIS — R2681 Unsteadiness on feet: Secondary | ICD-10-CM | POA: Diagnosis not present

## 2017-04-09 DIAGNOSIS — R293 Abnormal posture: Secondary | ICD-10-CM | POA: Diagnosis not present

## 2017-04-09 DIAGNOSIS — M6281 Muscle weakness (generalized): Secondary | ICD-10-CM | POA: Diagnosis not present

## 2017-04-09 DIAGNOSIS — I69354 Hemiplegia and hemiparesis following cerebral infarction affecting left non-dominant side: Secondary | ICD-10-CM | POA: Diagnosis not present

## 2017-04-09 NOTE — Patient Instructions (Signed)
Ankle Alphabet    Using left ankle and foot only, trace the letters of the alphabet. Perform A to Z. Repeat _1___ times per set.  Do __2__ sessions per day.  http://orth.exer.us/17   Copyright  VHI. All rights reserved.

## 2017-04-09 NOTE — Therapy (Signed)
[facility] [address] [place], [place], [address] Phone: [phone]   Fax:  [phone]  Physical Therapy Treatment  Patient Details  Name: Ellen Henry MRN: [id number] Date of Birth: [birth year] Referring Provider: Antony Contras, MD  Encounter Date: 04/09/2017      PT End of Session - 04/09/17 1021    Visit Number 6   Number of Visits 16   Date for PT Re-Evaluation 04/30/17   Authorization Type G code every 10th visit   PT Start Time 1018   PT Stop Time 1100   PT Time Calculation (min) 42 min   Equipment Utilized During Treatment Gait belt   Activity Tolerance Patient tolerated treatment well   Behavior During Therapy [facility] for tasks assessed/performed      Past Medical History:  Diagnosis Date  . Arthritis   . Dry skin   . GERD (gastroesophageal reflux disease)    "years ago", diet controlled  . Gout   . H/O pyelonephritis    as a child  . 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 Hypertension   . Neuropathy   . Pneumonia   . Radiculopathy   . Sleep apnea    does not use cpap  . Stroke [facility])     Past Surgical History:  Procedure Laterality Date  . ABDOMINAL EXPOSURE N/A 08/29/2014   Procedure: ABDOMINAL EXPOSURE;  Surgeon: Rosetta Posner, MD;  Location: [place];  Service: Vascular;  Laterality: N/A;  . ABDOMINAL HYSTERECTOMY    . ANTERIOR LUMBAR FUSION N/A 08/29/2014   Procedure: ANTERIOR LUMBAR FUSION 1 LEVEL;  Surgeon: Sinclair Ship, MD;  Location: [place];  Service: Orthopedics;  Laterality: N/A;  Lumbar 4-5 anterior lumbar interbody fusion with allograft and instrumentation.  . APPENDECTOMY    . BACK SURGERY  08/28/2014 and 08/29/2014   lumbar fusion  . CARPAL TUNNEL RELEASE Right    release done twice  . COLONOSCOPY    . JOINT REPLACEMENT    . left knee replacement    . REPLACEMENT TOTAL KNEE  2010   rigth knee  . TONSILLECTOMY    . TOTAL KNEE ARTHROPLASTY Left 02/15/2015    Procedure: TOTAL KNEE ARTHROPLASTY;  Surgeon: [name], MD;  Location: [place];  Service: Orthopedics;  Laterality: Left;    There were no vitals filed for this visit.      Subjective Assessment - 04/09/17 1017    Subjective The patient brought her walker today.  She has not had time to get cane yet.  She is doing HEP intermittently in the home.   Patient Stated Goals "Be able to use my left side withut falling, getting off of the walker, gaining strength."   Currently in Pain? Yes   Pain Score 4   chronic pain- see prior note--PT monitoring, but no goal to follow                         [facility] Adult PT Treatment/Exercise - 04/09/17 1033      Ambulation/Gait   Ambulation/Gait Yes   Ambulation/Gait Assistance 4: Min guard   Ambulation/Gait Assistance Details Treadmill x 2 minutes x 2 sets at 0.9-1.0 mph with CGA for safety and tactile cues for longer stride length.   Ambulation Distance (Feet) 450 Feet  200 ft x 2 reps   Assistive device None   Ambulation Surface Level   Gait Comments Gait with  CGA with cues on longer stride length and left heel strike.      Therapeutic Activites    Therapeutic Activities Other Therapeutic Activities   Other Therapeutic Activities Floor<>stand with UE support and supervision.     Neuro Re-ed    Neuro Re-ed Details  ROCKER BOARD:  Standing in parallel bars working on head motion horiz/vertical, alternating UE reaching anteriorly, standing with eyes closed with CGA.  Walking on compliant surfaces without device with CGA for safety.      Exercises   Exercises Other Exercises   Other Exercises  Lateral step ups adding contralateral hip abduction 10 reps x 2, standing heel raises dec'ing UE support.  Anterior step ups onto 4" steps.   Seated LE coordination emphasizing alternating quick movements R/L sides, ankle alphabet seated.     Knee/Hip Exercises: Seated   Sit to Sand without UE support;10 reps                PT  Education - 04/09/17 1056    Education provided Yes   Education Details HEP: ankle alphabet   Person(s) Educated Patient   Methods Explanation;Demonstration;Handout   Comprehension Verbalized understanding;Returned demonstration          PT Short Term Goals - 04/09/17 1024      PT SHORT TERM GOAL #1   Title The patient will be indep with HEP for LE strength, balance and general mobility.   Baseline Met on 03/30/17   Time 4   Period Weeks   Status Achieved     PT SHORT TERM GOAL #2   Title The patient will improve Berg score from 39/56 to > or equal to 44/56 to demo improving standing balance.   Baseline Scores 47/56 on 03/30/17   Time 4   Period Weeks   Status Achieved     PT SHORT TERM GOAL #3   Title The patient will improve gait speed from 1.41 ft/sec to > or equal to 2.0 ft/sec without assistive device for improve mobility in the home.   Baseline Met on 03/30/17 scoring 2.14 ft/sec   Time 4   Period Weeks   Status Achieved     PT SHORT TERM GOAL #4   Title The patient will move floor<>stand with CGA with UE support due to recent falls.   Baseline Patient required supervision   Time 4   Period Weeks   Status Achieved           PT Long Term Goals - 03/01/17 1459      PT LONG TERM GOAL #1   Title The patient will improve SIS LE by 12% (baseline is 40.5%).   Baseline Target date 04/29/2017   Time 8   Period Weeks     PT LONG TERM GOAL #2   Title The patient will improve gait speed from 1.41 ft/sec to > or equal to 2.2 ft/sec to demo dec'ing risk for falls.   Baseline Target date 04/29/2017   Time 8   Period Weeks     PT LONG TERM GOAL #3   Title The patient will improve Berg from 39/56 to > or equal to 46/56 to demo dec'd risk for falls.   Baseline Target date 04/29/2017   Time 8   Period Weeks     PT LONG TERM GOAL #4   Title The patient will negotiate 4 steps without a handrail to negotiate home environment modified indep.   Baseline Target date  04/29/2017   Time  8   Period Weeks     PT LONG TERM GOAL #5   Title The patient will be indep with post d/c HEP.   Baseline Target date 04/29/2017   Time 8   Period Weeks               Plan - 04/09/17 1100    Clinical Impression Statement The patient met STG for floor transfers.  PT worked on treadmill for endurance and consistent pacing with verbal cues.  Patient has intermittent loss of balance with ambulation mostly when L foot timing slows and creates instability.  PT encouraging continued use of RW.    PT Treatment/Interventions ADLs/Self Care Home Management;Therapeutic activities;Therapeutic exercise;Neuromuscular re-education;Balance training;Gait training;Stair training;Functional mobility training;Patient/family education   PT Next Visit Plan continue to work on gait with cane, balance activities, activity tolerance   Consulted and Agree with Plan of Care Patient      Patient will benefit from skilled therapeutic intervention in order to improve the following deficits and impairments:  Abnormal gait, Decreased balance, Decreased mobility, Decreased strength, Pain, Difficulty walking, Decreased activity tolerance  Visit Diagnosis: Muscle weakness (generalized)  Unsteadiness on feet  Other abnormalities of gait and mobility     Problem List Patient Active Problem List   Diagnosis Date Noted  . Chronic constipation   . Acute idiopathic gout involving toe of right foot   . Acute pain of right knee   . Failed back syndrome   . Pain   . Chronic pain syndrome   . Labile blood pressure   . Radicular pain   . Benign essential HTN   . Slow transit constipation   . Chronic bilateral low back pain without sciatica   . Dysphagia, post-stroke   . 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 Left hemiparesis ([place])   . Generalized weakness   . Acute ischemic stroke ([place]) 12/15/2016  . Urinary retention 02/25/2015  . Primary osteoarthritis of left knee 02/15/2015  . Morbid  obesity ([place]) 02/15/2015  . Essential hypertension, benign 09/07/2014  . Constipation 09/07/2014  . Hyperlipidemia 09/05/2014  . Gout 09/05/2014  . Acute blood loss anemia 09/05/2014  . Neuropathy 09/05/2014  . UTI (urinary tract infection) 08/31/2014  . Radiculopathy 08/29/2014    WEAVER,CHRISTINA, PT 04/09/2017, 12:20 PM  [facility] [address] [place], [place], [address] Phone: [phone]   Fax:  [phone]  Name: SHAKOYA GILMORE MRN: [id number] Date of Birth: 1946/06/13

## 2017-04-13 ENCOUNTER — Ambulatory Visit: Payer: Medicare Other | Admitting: Physical Therapy

## 2017-04-13 ENCOUNTER — Encounter: Payer: Self-pay | Admitting: Physical Therapy

## 2017-04-13 ENCOUNTER — Ambulatory Visit: Payer: Medicare Other | Admitting: Occupational Therapy

## 2017-04-13 DIAGNOSIS — I69354 Hemiplegia and hemiparesis following cerebral infarction affecting left non-dominant side: Secondary | ICD-10-CM | POA: Diagnosis not present

## 2017-04-13 DIAGNOSIS — R2689 Other abnormalities of gait and mobility: Secondary | ICD-10-CM

## 2017-04-13 DIAGNOSIS — R2681 Unsteadiness on feet: Secondary | ICD-10-CM

## 2017-04-13 DIAGNOSIS — R278 Other lack of coordination: Secondary | ICD-10-CM | POA: Diagnosis not present

## 2017-04-13 DIAGNOSIS — R29818 Other symptoms and signs involving the nervous system: Secondary | ICD-10-CM | POA: Diagnosis not present

## 2017-04-13 DIAGNOSIS — M6281 Muscle weakness (generalized): Secondary | ICD-10-CM

## 2017-04-13 DIAGNOSIS — R293 Abnormal posture: Secondary | ICD-10-CM | POA: Diagnosis not present

## 2017-04-13 NOTE — Therapy (Signed)
[facility] [address] [place], [place], [address] Phone: [phone]   Fax:  [phone]  Physical Therapy Treatment  Patient Details  Name: Ellen Henry MRN: [id number] Date of Birth: 11-Dec-1945 Referring Provider: Antony Contras, MD  Encounter Date: 04/13/2017      PT End of Session - 04/13/17 1023    Visit Number 7   Number of Visits 16   Date for PT Re-Evaluation 04/30/17   Authorization Type G code every 10th visit   PT Start Time 1017   PT Stop Time 1100   PT Time Calculation (min) 43 min   Equipment Utilized During Treatment Gait belt   Activity Tolerance Patient tolerated treatment well   Behavior During Therapy [facility] for tasks assessed/performed      Past Medical History:  Diagnosis Date  . Arthritis   . Dry skin   . GERD (gastroesophageal reflux disease)    "years ago", diet controlled  . Gout   . H/O pyelonephritis    as a child  . 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 Hypertension   . Neuropathy   . Pneumonia   . Radiculopathy   . Sleep apnea    does not use cpap  . Stroke [facility])     Past Surgical History:  Procedure Laterality Date  . ABDOMINAL EXPOSURE N/A 08/29/2014   Procedure: ABDOMINAL EXPOSURE;  Surgeon: Rosetta Posner, MD;  Location: [place];  Service: Vascular;  Laterality: N/A;  . ABDOMINAL HYSTERECTOMY    . ANTERIOR LUMBAR FUSION N/A 08/29/2014   Procedure: ANTERIOR LUMBAR FUSION 1 LEVEL;  Surgeon: Sinclair Ship, MD;  Location: [place];  Service: Orthopedics;  Laterality: N/A;  Lumbar 4-5 anterior lumbar interbody fusion with allograft and instrumentation.  . APPENDECTOMY    . BACK SURGERY  08/28/2014 and 08/29/2014   lumbar fusion  . CARPAL TUNNEL RELEASE Right    release done twice  . COLONOSCOPY    . JOINT REPLACEMENT    . left knee replacement    . REPLACEMENT TOTAL KNEE  2010   rigth knee  . TONSILLECTOMY    . TOTAL KNEE ARTHROPLASTY Left 02/15/2015    Procedure: TOTAL KNEE ARTHROPLASTY;  Surgeon: [name], MD;  Location: [place];  Service: Orthopedics;  Laterality: Left;    There were no vitals filed for this visit.      Subjective Assessment - 04/13/17 1021    Subjective Pt continues to use walker, has not purchased cane as yet. No new falls to report. No change in pain-chronic in nature.    Patient Stated Goals "Be able to use my left side withut falling, getting off of the walker, gaining strength."   Currently in Pain? Yes  chronic in nature- monitoring with session, not directely addressing   Pain Score 3    Pain Location Back   Pain Type Chronic pain   Pain Onset More than a month ago   Pain Frequency Constant             [facility] Adult PT Treatment/Exercise - 04/13/17 1023      Transfers   Transfers Sit to Stand;Stand to Sit   Sit to Stand 6: Modified independent (Device/Increase time);With upper extremity assist;From bed;From chair/3-in-1   Stand to Sit 6: Modified independent (Device/Increase time);With upper extremity assist;To bed;To chair/3-in-1   Number of Reps 10 reps;1 set   Comments sit<>stands from low mat with OH press using  2# weighted ball x 10 reps, cues for full upright posture and slow, controlled descent with sitting down.      Ambulation/Gait   Ambulation/Gait Yes   Ambulation/Gait Assistance 4: Min guard   Ambulation/Gait Assistance Details gait indoors with no AD: min guard assist for balance with cues for increased base of support, step length and reciprocal arm swing. all of this improved on second lap except for continued to demo narrow base of support with single episode of left toe scuffing toward end of second lap. no balance loss with toe scuffing. gait with cane: indoor/outdoors combined with min guard to min assist for balance. cues on posture, step length and cane placement. cues to ensure good footing on grass before advancing the next foot forward. increase assist needed on grass and  toward end of gait on uneven surfaces due to incr instablility noted, ? due to fatigue.            Ambulation Distance (Feet) 220 Feet  x2 indoors; 310 ft x1 in/outdoors combined   Assistive device None;Straight cane  straight cane with rubber quad tip   Gait Pattern Decreased stride length;Decreased arm swing - right;Decreased arm swing - left;Decreased step length - right;Decreased step length - left;Decreased stance time - left;Decreased weight shift to left;Antalgic   Ambulation Surface Level;Indoor;Unlevel;Outdoor;Paved;Grass     Neuro Re-ed    Neuro Re-ed Details  standing at bottom of stairs: toe taps up/down bottom 3 steps x 10 reps with right leg tapping, stance on left leg. x 6 reps up/down bottom 3 steps, then last 4 reps using just the bottom 2 steps with left leg tapping in right stance. bil UE support on rails, cues on form and technique.       Knee/Hip Exercises: Standing   Lateral Step Up Both;1 set;10 reps;Hand Hold: 2;Step Height: 6";Limitations   Lateral Step Up Limitations lateral step ups with contrallateral LE performing hip abduction (side kick) during lift and then returning to floor, 10 reps both sides.    Forward Step Up Both;1 set;10 reps;Hand Hold: 1;Step Height: 6";Limitations   Forward Step Up Limitations forward step ups with contralateral LE lifting up into air with knee flexion and then returning to floor (did not touch to step). cues for slow lowering and decr UE use with lifting.             PT Short Term Goals - 04/09/17 1024      PT SHORT TERM GOAL #1   Title The patient will be indep with HEP for LE strength, balance and general mobility.   Baseline Met on 03/30/17   Time 4   Period Weeks   Status Achieved     PT SHORT TERM GOAL #2   Title The patient will improve Berg score from 39/56 to > or equal to 44/56 to demo improving standing balance.   Baseline Scores 47/56 on 03/30/17   Time 4   Period Weeks   Status Achieved     PT SHORT TERM GOAL  #3   Title The patient will improve gait speed from 1.41 ft/sec to > or equal to 2.0 ft/sec without assistive device for improve mobility in the home.   Baseline Met on 03/30/17 scoring 2.14 ft/sec   Time 4   Period Weeks   Status Achieved     PT SHORT TERM GOAL #4   Title The patient will move floor<>stand with CGA with UE support due to recent falls.   Baseline Patient  required supervision   Time 4   Period Weeks   Status Achieved           PT Long Term Goals - 03/01/17 1459      PT LONG TERM GOAL #1   Title The patient will improve SIS LE by 12% (baseline is 40.5%).   Baseline Target date 04/29/2017   Time 8   Period Weeks     PT LONG TERM GOAL #2   Title The patient will improve gait speed from 1.41 ft/sec to > or equal to 2.2 ft/sec to demo dec'ing risk for falls.   Baseline Target date 04/29/2017   Time 8   Period Weeks     PT LONG TERM GOAL #3   Title The patient will improve Berg from 39/56 to > or equal to 46/56 to demo dec'd risk for falls.   Baseline Target date 04/29/2017   Time 8   Period Weeks     PT LONG TERM GOAL #4   Title The patient will negotiate 4 steps without a handrail to negotiate home environment modified indep.   Baseline Target date 04/29/2017   Time 8   Period Weeks     PT LONG TERM GOAL #5   Title The patient will be indep with post d/c HEP.   Baseline Target date 04/29/2017   Time 8   Period Weeks            Plan - 04/13/17 1023    Clinical Impression Statement Today's skilled session focused on gait on various surfaces and on LE strengthening without any issues. Pt is making steady progress toward goals and should benefit from continued PT to progress toward unmet goals.   PT Treatment/Interventions ADLs/Self Care Home Management;Therapeutic activities;Therapeutic exercise;Neuromuscular re-education;Balance training;Gait training;Stair training;Functional mobility training;Patient/family education   PT Next Visit Plan continue to  work on gait with cane, balance activities, activity tolerance   Consulted and Agree with Plan of Care Patient      Patient will benefit from skilled therapeutic intervention in order to improve the following deficits and impairments:  Abnormal gait, Decreased balance, Decreased mobility, Decreased strength, Pain, Difficulty walking, Decreased activity tolerance  Visit Diagnosis: Muscle weakness (generalized)  Unsteadiness on feet  Other abnormalities of gait and mobility  Hemiplegia and hemiparesis following cerebral infarction affecting left non-dominant side [facility])     Problem List Patient Active Problem List   Diagnosis Date Noted  . Chronic constipation   . Acute idiopathic gout involving toe of right foot   . Acute pain of right knee   . Failed back syndrome   . Pain   . Chronic pain syndrome   . Labile blood pressure   . Radicular pain   . Benign essential HTN   . Slow transit constipation   . Chronic bilateral low back pain without sciatica   . Dysphagia, post-stroke   . 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 Left hemiparesis ([place])   . Generalized weakness   . Acute ischemic stroke ([place]) 12/15/2016  . Urinary retention 02/25/2015  . Primary osteoarthritis of left knee 02/15/2015  . Morbid obesity ([place]) 02/15/2015  . Essential hypertension, benign 09/07/2014  . Constipation 09/07/2014  . Hyperlipidemia 09/05/2014  . Gout 09/05/2014  . Acute blood loss anemia 09/05/2014  . Neuropathy 09/05/2014  . UTI (urinary tract infection) 08/31/2014  . Radiculopathy 08/29/2014    Willow Ora, PTA, [facility] [address] [place], [place] [address] [phone] 04/13/17,  9:36 PM   Name: Ellen Henry MRN: [id number] Date of Birth: [birth year]

## 2017-04-15 DIAGNOSIS — I10 Essential (primary) hypertension: Secondary | ICD-10-CM | POA: Diagnosis not present

## 2017-04-15 DIAGNOSIS — J209 Acute bronchitis, unspecified: Secondary | ICD-10-CM | POA: Diagnosis not present

## 2017-04-15 DIAGNOSIS — Z8673 Personal history of transient ischemic attack (TIA), and cerebral infarction without residual deficits: Secondary | ICD-10-CM | POA: Diagnosis not present

## 2017-04-15 DIAGNOSIS — I639 Cerebral infarction, unspecified: Secondary | ICD-10-CM | POA: Diagnosis not present

## 2017-04-20 ENCOUNTER — Other Ambulatory Visit: Payer: Self-pay | Admitting: Physical Medicine & Rehabilitation

## 2017-04-20 ENCOUNTER — Ambulatory Visit: Payer: Medicare Other | Admitting: Occupational Therapy

## 2017-04-20 ENCOUNTER — Encounter: Payer: Self-pay | Admitting: Occupational Therapy

## 2017-04-20 ENCOUNTER — Ambulatory Visit: Payer: Medicare Other | Admitting: Physical Therapy

## 2017-04-20 ENCOUNTER — Encounter: Payer: Self-pay | Admitting: Physical Therapy

## 2017-04-20 DIAGNOSIS — R293 Abnormal posture: Secondary | ICD-10-CM

## 2017-04-20 DIAGNOSIS — R2681 Unsteadiness on feet: Secondary | ICD-10-CM

## 2017-04-20 DIAGNOSIS — I69354 Hemiplegia and hemiparesis following cerebral infarction affecting left non-dominant side: Secondary | ICD-10-CM | POA: Diagnosis not present

## 2017-04-20 DIAGNOSIS — R2689 Other abnormalities of gait and mobility: Secondary | ICD-10-CM

## 2017-04-20 DIAGNOSIS — M6281 Muscle weakness (generalized): Secondary | ICD-10-CM | POA: Diagnosis not present

## 2017-04-20 DIAGNOSIS — R278 Other lack of coordination: Secondary | ICD-10-CM | POA: Diagnosis not present

## 2017-04-20 DIAGNOSIS — R29818 Other symptoms and signs involving the nervous system: Secondary | ICD-10-CM | POA: Diagnosis not present

## 2017-04-20 NOTE — Therapy (Signed)
[facility] [address] [place], [place], [address] Phone: [phone]   Fax:  [phone]  Physical Therapy Treatment  Patient Details  Name: Ellen Henry MRN: [id number] Date of Birth: Jun 20, 1946 Referring Provider: Antony Contras, MD  Encounter Date: 04/20/2017      PT End of Session - 04/20/17 1106    Visit Number 8   Number of Visits 16   Date for PT Re-Evaluation 04/30/17   Authorization Type G code every 10th visit   PT Start Time 1102   PT Stop Time 1145   PT Time Calculation (min) 43 min   Equipment Utilized During Treatment Gait belt   Activity Tolerance Patient tolerated treatment well   Behavior During Therapy [facility] for tasks assessed/performed      Past Medical History:  Diagnosis Date  . Arthritis   . Dry skin   . GERD (gastroesophageal reflux disease)    "years ago", diet controlled  . Gout   . H/O pyelonephritis    as a child  . 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 Hypertension   . Neuropathy   . Pneumonia   . Radiculopathy   . Sleep apnea    does not use cpap  . Stroke [facility])     Past Surgical History:  Procedure Laterality Date  . ABDOMINAL EXPOSURE N/A 08/29/2014   Procedure: ABDOMINAL EXPOSURE;  Surgeon: Rosetta Posner, MD;  Location: [place];  Service: Vascular;  Laterality: N/A;  . ABDOMINAL HYSTERECTOMY    . ANTERIOR LUMBAR FUSION N/A 08/29/2014   Procedure: ANTERIOR LUMBAR FUSION 1 LEVEL;  Surgeon: Sinclair Ship, MD;  Location: [place];  Service: Orthopedics;  Laterality: N/A;  Lumbar 4-5 anterior lumbar interbody fusion with allograft and instrumentation.  . APPENDECTOMY    . BACK SURGERY  08/28/2014 and 08/29/2014   lumbar fusion  . CARPAL TUNNEL RELEASE Right    release done twice  . COLONOSCOPY    . JOINT REPLACEMENT    . left knee replacement    . REPLACEMENT TOTAL KNEE  2010   rigth knee  . TONSILLECTOMY    . TOTAL KNEE ARTHROPLASTY Left 02/15/2015    Procedure: TOTAL KNEE ARTHROPLASTY;  Surgeon: [name], MD;  Location: [place];  Service: Orthopedics;  Laterality: Left;    There were no vitals filed for this visit.      Subjective Assessment - 04/20/17 1104    Subjective No new complaints. No new falls. Continues to have lower back pain-chronic in nature. Does have PNA, diagnosed on Thurday. On antibiotics currently to treat it.    Patient Stated Goals "Be able to use my left side withut falling, getting off of the walker, gaining strength."   Currently in Pain? Yes   Pain Score 4    Pain Location Back   Pain Orientation Lower   Pain Descriptors / Indicators Aching;Sore   Pain Type Chronic pain   Pain Onset More than a month ago   Pain Frequency Constant   Aggravating Factors  increased activity   Pain Relieving Factors meds,rest            [facility] Adult PT Treatment/Exercise - 04/20/17 1108      Transfers   Transfers Sit to Stand;Stand to Sit   Sit to Stand 6: Modified independent (Device/Increase time);With upper extremity assist;From bed;From chair/3-in-1   Stand to Sit 6: Modified independent (Device/Increase time);With upper extremity assist;To bed;To chair/3-in-1  Ambulation/Gait   Ambulation/Gait Yes   Ambulation/Gait Assistance 5: Supervision;4: Min guard   Ambulation/Gait Assistance Details cues on posture and step length with all gait. min guard assistance provided on outdoor compliant surfaces due to slight instability with gait on compliant surfaces                      Ambulation Distance (Feet) 400 Feet  x1 indoors, 350 x1 in/outdoors   Assistive device Straight cane  with rubber quad tip   Gait Pattern Decreased stride length;Decreased arm swing - right;Decreased arm swing - left;Decreased step length - right;Decreased step length - left;Decreased stance time - left;Decreased weight shift to left;Antalgic   Ambulation Surface Level;Indoor;Unlevel;Outdoor;Paved;Grass             Balance  Exercises - 04/20/17 1122      Balance Exercises: Standing   Standing Eyes Closed Narrow base of support (BOS);Head turns;Foam/compliant surface;Other reps (comment);30 secs;Limitations   SLS with Vectors Solid surface;Other reps (comment);Limitations   Balance Beam standing with feet across blue foam beam with light UE support on bars: alternating forward stepping to floor and back onto beam x 10 reps each way, followed by alternating backward stepping to floor and back onto beam x 10 reps each leg. all with min assist and cues on ex form/technique/weight shifting.                                       Balance Exercises: Standing   Standing Eyes Closed Limitations in air ex with no UE support, min guard to min assist for balance: EC no head movements 20 sec's x 3 reps, progressing towards EC head movements left<>right, up<>down and diagonals both ways x 10 reps each. min guard to min assist for balance with cues on posture and weight shifting to assist with balance.                                SLS with Vectors Limitations 2 foam bubbles on floor: alternating fwd toe taps to each x 10 reps, alternating cross toe taps to each x 10 reps, min guard to min assist for balance with no UE support. cues to slow down and for weight shifting with activity             PT Short Term Goals - 04/09/17 1024      PT SHORT TERM GOAL #1   Title The patient will be indep with HEP for LE strength, balance and general mobility.   Baseline Met on 03/30/17   Time 4   Period Weeks   Status Achieved     PT SHORT TERM GOAL #2   Title The patient will improve Berg score from 39/56 to > or equal to 44/56 to demo improving standing balance.   Baseline Scores 47/56 on 03/30/17   Time 4   Period Weeks   Status Achieved     PT SHORT TERM GOAL #3   Title The patient will improve gait speed from 1.41 ft/sec to > or equal to 2.0 ft/sec without assistive device for improve mobility in the home.   Baseline Met on  03/30/17 scoring 2.14 ft/sec   Time 4   Period Weeks   Status Achieved     PT SHORT TERM GOAL #4   Title The patient will  move floor<>stand with CGA with UE support due to recent falls.   Baseline Patient required supervision   Time 4   Period Weeks   Status Achieved           PT Long Term Goals - 03/01/17 1459      PT LONG TERM GOAL #1   Title The patient will improve SIS LE by 12% (baseline is 40.5%).   Baseline Target date 04/29/2017   Time 8   Period Weeks     PT LONG TERM GOAL #2   Title The patient will improve gait speed from 1.41 ft/sec to > or equal to 2.2 ft/sec to demo dec'ing risk for falls.   Baseline Target date 04/29/2017   Time 8   Period Weeks     PT LONG TERM GOAL #3   Title The patient will improve Berg from 39/56 to > or equal to 46/56 to demo dec'd risk for falls.   Baseline Target date 04/29/2017   Time 8   Period Weeks     PT LONG TERM GOAL #4   Title The patient will negotiate 4 steps without a handrail to negotiate home environment modified indep.   Baseline Target date 04/29/2017   Time 8   Period Weeks     PT LONG TERM GOAL #5   Title The patient will be indep with post d/c HEP.   Baseline Target date 04/29/2017   Time 8   Period Weeks            Plan - 04/20/17 1107    Clinical Impression Statement Today's skilled session continued to focuse on gait with cane (pt plans to purchase one soon) and on balance. Pt is making steady progress toward goals and should benefit from continued PT to progress toward unmet goals. Of note, pt has requested to end PT at the end of her current schedule due to financial concerns.                              Rehab Potential Good   Clinical Impairments Affecting Rehab Potential h/o chronic back pain, patient is motivated to participate.   PT Frequency 2x / week   PT Duration 8 weeks   PT Treatment/Interventions ADLs/Self Care Home Management;Therapeutic activities;Therapeutic exercise;Neuromuscular  re-education;Balance training;Gait training;Stair training;Functional mobility training;Patient/family education   PT Next Visit Plan continue to work on gait with cane, balance activities, activity tolerance   Consulted and Agree with Plan of Care Patient      Patient will benefit from skilled therapeutic intervention in order to improve the following deficits and impairments:  Abnormal gait, Decreased balance, Decreased mobility, Decreased strength, Pain, Difficulty walking, Decreased activity tolerance  Visit Diagnosis: Muscle weakness (generalized)  Unsteadiness on feet  Hemiplegia and hemiparesis following cerebral infarction affecting left non-dominant side (HCC)  Other abnormalities of gait and mobility  Abnormal posture  Other lack of coordination     Problem List Patient Active Problem List   Diagnosis Date Noted  . Chronic constipation   . Acute idiopathic gout involving toe of right foot   . Acute pain of right knee   . Failed back syndrome   . Pain   . Chronic pain syndrome   . Labile blood pressure   . Radicular pain   . Benign essential HTN   . Slow transit constipation   . Chronic bilateral low back pain without sciatica   . Dysphagia,  post-stroke   . 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 Left hemiparesis ([place])   . Generalized weakness   . Acute ischemic stroke ([place]) 12/15/2016  . Urinary retention 02/25/2015  . Primary osteoarthritis of left knee 02/15/2015  . Morbid obesity ([place]) 02/15/2015  . Essential hypertension, benign 09/07/2014  . Constipation 09/07/2014  . Hyperlipidemia 09/05/2014  . Gout 09/05/2014  . Acute blood loss anemia 09/05/2014  . Neuropathy 09/05/2014  . UTI (urinary tract infection) 08/31/2014  . Radiculopathy 08/29/2014    Willow Ora, PTA, [facility] Outpatient Neuro [facility] [address] [place], [place] [address] [phone] 04/20/17, 11:00 PM   Name: Ellen Henry MRN: [id number] Date of Birth:  Jun 26, 1946

## 2017-04-20 NOTE — Therapy (Addendum)
[facility] [address] [place], [place], [address] Phone: [phone]   Fax:  [phone]  Occupational Therapy Treatment  Patient Details  Name: Ellen Henry MRN: [id number] Date of Birth: January 28, 1946 Referring Provider: Dr. Leonie Man  Encounter Date: 04/20/2017      OT End of Session - 04/20/17 1531    Visit Number 5   Number of Visits 16   Date for OT Re-Evaluation 05/25/17   Authorization Type medicare will need G code and PN every 10th visit   Authorization Time Period 05/25/2017 (goals revised and resubmitted therefore new 60 day cycle)   Authorization - Visit Number 5   Authorization - Number of Visits 10   OT Start Time 1017   OT Stop Time 1059   OT Time Calculation (min) 42 min   Activity Tolerance --      Past Medical History:  Diagnosis Date  . Arthritis   . Dry skin   . GERD (gastroesophageal reflux disease)    "years ago", diet controlled  . Gout   . H/O pyelonephritis    as a child  . 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 Hypertension   . Neuropathy   . Pneumonia   . Radiculopathy   . Sleep apnea    does not use cpap  . Stroke [facility])     Past Surgical History:  Procedure Laterality Date  . ABDOMINAL EXPOSURE N/A 08/29/2014   Procedure: ABDOMINAL EXPOSURE;  Surgeon: Rosetta Posner, MD;  Location: [place];  Service: Vascular;  Laterality: N/A;  . ABDOMINAL HYSTERECTOMY    . ANTERIOR LUMBAR FUSION N/A 08/29/2014   Procedure: ANTERIOR LUMBAR FUSION 1 LEVEL;  Surgeon: Sinclair Ship, MD;  Location: [place];  Service: Orthopedics;  Laterality: N/A;  Lumbar 4-5 anterior lumbar interbody fusion with allograft and instrumentation.  . APPENDECTOMY    . BACK SURGERY  08/28/2014 and 08/29/2014   lumbar fusion  . CARPAL TUNNEL RELEASE Right    release done twice  . COLONOSCOPY    . JOINT REPLACEMENT    . left knee replacement    . REPLACEMENT TOTAL KNEE  2010   rigth knee  .  TONSILLECTOMY    . TOTAL KNEE ARTHROPLASTY Left 02/15/2015   Procedure: TOTAL KNEE ARTHROPLASTY;  Surgeon: [name], MD;  Location: [place];  Service: Orthopedics;  Laterality: Left;    There were no vitals filed for this visit.      Subjective Assessment - 04/20/17 1021    Subjective  I have pneumonia - I didm't even know it but I am on medicine now   Pertinent History pt with [facility] 12/15/2016;  pt completed inpt rehab as well as [facility].  Pt had back surgery in 2015 with residual pain.  Pt had fallen three times since discharge home.    Patient Stated Goals get back to doing things the way I did them before   Currently in Pain? Yes   Pain Score 4    Pain Location Back   Pain Orientation Lower   Pain Descriptors / Indicators Aching;Sore   Pain Type Chronic pain   Pain Onset More than a month ago   Pain Frequency Constant   Aggravating Factors  lower back   Pain Relieving Factors meds, rest                      OT Treatments/Exercises (OP) -  04/20/17 1526      ADLs   ADL Comments Addresed LTG's - see goals for update.  Pt demonstrate excellent LUE return and functional use. Pt continues to demonstrate balance deficts and decreased actviity tolerance.  As pt fatigue, balance worsens.       Neurological Re-education Exercises   Other Exercises 1 Neuro re ed to address dynamic standing balance via functional home mgmt tasks incorporating bending, turns, stops and starts as well as challenging activity tolerance. Pt with near LOB x2 however was able to self correct.               Balance Exercises - 04/20/17 1122      Balance Exercises: Standing   Standing Eyes Closed Narrow base of support (BOS);Head turns;Foam/compliant surface;Other reps (comment);30 secs;Limitations   SLS with Vectors Solid surface;Other reps (comment);Limitations   Balance Beam standing with feet across blue foam beam with light UE support on bars:      Balance Exercises: Standing    Standing Eyes Closed Limitations in air ex with no UE support, min guard to min assist for balance:   SLS with Vectors Limitations 2 foam bubbles on floor: alternating fwd toe taps to each x 10 reps, alternating cross toe taps to each x 10 reps, min guard to min assist for balance with no UE support. cues to slow down and for weight shifting with activity             OT Short Term Goals - 04/20/17 1528      OT SHORT TERM GOAL #1   Title Pt will be mod I with HEP - 03/29/2017   Status Achieved     OT SHORT TERM GOAL #2   Title Pt will demonstrat improved coordination as evidenced by decreasing time on 9 hole peg test by at least 5 seconds (baseline R= 29.53, L=52.25)   Baseline 03/30/17 R= 23.06, L = 35.93 see goal for eval baseline   Status Achieved  03/30/2017 R= 23.06, L = 35.93     OT SHORT TERM GOAL #3   Title Pt will demostrate ability to use LUE as non dominant 75% of the time for ADL and IADL tasks   Status Achieved     OT SHORT TERM GOAL #5   Title Pt will demonstrate ability to pick up 2 pound object off shelf x3 without dropping   Baseline 03/30/2017 able to lift 3 pound object x3     Status Achieved     OT SHORT TERM GOAL #6   Title Pt will be mod I with UB bathing AE prn   Status Achieved           OT Long Term Goals - 04/20/17 1529      OT LONG TERM GOAL #1   Title Pt will be mod I with upgraded HEP - 04/26/2017   Status On-going     OT LONG TERM GOAL #2   Title Pt will demonstrate improved coordination as evidenced by decreasing time on 9 hole peg by decreasing 20 seconds for LUE to assist with functional tasks (L=52.23)   Status Achieved  31.90     OT LONG TERM GOAL #3   Title Pt will be able to complete fine motor tasks bilaterally at mid reach for activity (using LUE as non dominant)   Status Achieved     OT LONG TERM GOAL #4   Title Pt will be mod I with cooking and laundry without device and  no LOB   Status On-going     OT LONG TERM GOAL #5    Title Pt will be able to lift 4 pound weight off overhead shelf x3 without dropping   Status Achieved  6 pounds     OT LONG TERM GOAL #6   Title Pt will demonstrate improved grip strength by at least 10 pounds in LUE to assist with functional tasks (baseline= 35)   Baseline 03/30/2017 43 pounds   Status Achieved  62 pounds     OT LONG TERM GOAL #7   Title Pt will use LUE as non dominant 95% of the time in ADL and I ADL tasks.   Status Achieved     OT LONG TERM GOAL #8   Title Pt will tolerate at least 40 minutes of activity at ambulatory level without requiring a rest break.   Status On-going     OT LONG TERM GOAL  #9   Baseline Pt will be able to lift 6 pound object from low surface to counter height using BUE's (i.e lifting casserole out of oven)   Status Achieved  10 pound casserole simulated     OT LONG TERM GOAL  #10   TITLE Pt will be able to unhook shower curtain rings to take down shower curtain   Status Achieved               Plan - 04/20/17 1530    Clinical Impression Statement Pt progressing nicely toward goals. Pt wth pneumonia and small set back with activity tolerance.  Pt with improvement in all other areas.    Rehab Potential Excellent   OT Frequency 2x / week   OT Duration 8 weeks   OT Treatment/Interventions Self-care/ADL training;Therapeutic exercise;Neuromuscular education;Energy conservation;DME and/or AE instruction;Manual Therapy;Therapist, nutritional;Therapeutic activities;Patient/family education;Balance training   Plan dynamic standing balance, functional ambulation without a device, activity tolerance.   Consulted and Agree with Plan of Care Patient      Patient will benefit from skilled therapeutic intervention in order to improve the following deficits and impairments:  Decreased activity tolerance, Decreased balance, Decreased coordination, Decreased strength, Difficulty walking, Impaired UE functional use, Impaired tone  Visit  Diagnosis: Muscle weakness (generalized)  Unsteadiness on feet  Hemiplegia and hemiparesis following cerebral infarction affecting left non-dominant side (HCC)  Other lack of coordination  Abnormal posture    Problem List Patient Active Problem List   Diagnosis Date Noted  . Chronic constipation   . Acute idiopathic gout involving toe of right foot   . Acute pain of right knee   . Failed back syndrome   . Pain   . Chronic pain syndrome   . Labile blood pressure   . Radicular pain   . Benign essential HTN   . Slow transit constipation   . Chronic bilateral low back pain without sciatica   . Dysphagia, post-stroke   . 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 Left hemiparesis ([place])   . Generalized weakness   . Acute ischemic stroke ([place]) 12/15/2016  . Urinary retention 02/25/2015  . Primary osteoarthritis of left knee 02/15/2015  . Morbid obesity ([place]) 02/15/2015  . Essential hypertension, benign 09/07/2014  . Constipation 09/07/2014  . Hyperlipidemia 09/05/2014  . Gout 09/05/2014  . Acute blood loss anemia 09/05/2014  . Neuropathy 09/05/2014  . UTI (urinary tract infection) 08/31/2014  . Radiculopathy 08/29/2014    Forde Radon [facility] 04/20/2017, 4:50 PM  [facility] [address]  [place], [place], [address] Phone: [phone]   Fax:  [phone]  Name: SHAUNESSY DOBRATZ MRN: [id number] Date of Birth: [birth year]

## 2017-04-26 ENCOUNTER — Ambulatory Visit: Payer: Medicare Other | Admitting: Occupational Therapy

## 2017-04-26 ENCOUNTER — Ambulatory Visit: Payer: Medicare Other | Admitting: Physical Therapy

## 2017-04-26 ENCOUNTER — Encounter: Payer: Self-pay | Admitting: Physical Therapy

## 2017-04-26 DIAGNOSIS — R278 Other lack of coordination: Secondary | ICD-10-CM | POA: Diagnosis not present

## 2017-04-26 DIAGNOSIS — I69354 Hemiplegia and hemiparesis following cerebral infarction affecting left non-dominant side: Secondary | ICD-10-CM | POA: Diagnosis not present

## 2017-04-26 DIAGNOSIS — R2681 Unsteadiness on feet: Secondary | ICD-10-CM

## 2017-04-26 DIAGNOSIS — R293 Abnormal posture: Secondary | ICD-10-CM | POA: Diagnosis not present

## 2017-04-26 DIAGNOSIS — M6281 Muscle weakness (generalized): Secondary | ICD-10-CM

## 2017-04-26 DIAGNOSIS — R2689 Other abnormalities of gait and mobility: Secondary | ICD-10-CM | POA: Diagnosis not present

## 2017-04-26 DIAGNOSIS — R29818 Other symptoms and signs involving the nervous system: Secondary | ICD-10-CM | POA: Diagnosis not present

## 2017-04-26 NOTE — Therapy (Signed)
[facility] [address] [place], [place], [address] Phone: [phone]   Fax:  [phone]  Occupational Therapy Treatment  Patient Details  Name: Ellen Henry MRN: [id number] Date of Birth: 1946/03/04 Referring Provider: Dr. Leonie Man  Encounter Date: 04/26/2017      OT End of Session - 04/26/17 1201    Visit Number 6   Number of Visits 16   Date for OT Re-Evaluation 05/25/17   Authorization Type medicare will need G code and PN every 10th visit   Authorization Time Period 05/25/2017 (goals revised and resubmitted therefore new 60 day cycle)   Authorization - Visit Number 6   Authorization - Number of Visits 10   OT Start Time 8588   OT Stop Time 1100   OT Time Calculation (min) 45 min   Activity Tolerance Patient tolerated treatment well      Past Medical History:  Diagnosis Date  . Arthritis   . Dry skin   . GERD (gastroesophageal reflux disease)    "years ago", diet controlled  . Gout   . H/O pyelonephritis    as a child  . 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 Hypertension   . Neuropathy   . Pneumonia   . Radiculopathy   . Sleep apnea    does not use cpap  . Stroke [facility])     Past Surgical History:  Procedure Laterality Date  . ABDOMINAL EXPOSURE N/A 08/29/2014   Procedure: ABDOMINAL EXPOSURE;  Surgeon: Rosetta Posner, MD;  Location: [place];  Service: Vascular;  Laterality: N/A;  . ABDOMINAL HYSTERECTOMY    . ANTERIOR LUMBAR FUSION N/A 08/29/2014   Procedure: ANTERIOR LUMBAR FUSION 1 LEVEL;  Surgeon: Sinclair Ship, MD;  Location: [place];  Service: Orthopedics;  Laterality: N/A;  Lumbar 4-5 anterior lumbar interbody fusion with allograft and instrumentation.  . APPENDECTOMY    . BACK SURGERY  08/28/2014 and 08/29/2014   lumbar fusion  . CARPAL TUNNEL RELEASE Right    release done twice  . COLONOSCOPY    . JOINT REPLACEMENT    . left knee replacement    . REPLACEMENT TOTAL  KNEE  2010   rigth knee  . TONSILLECTOMY    . TOTAL KNEE ARTHROPLASTY Left 02/15/2015   Procedure: TOTAL KNEE ARTHROPLASTY;  Surgeon: [name], MD;  Location: [place];  Service: Orthopedics;  Laterality: Left;    There were no vitals filed for this visit.      Subjective Assessment - 04/26/17 1021    Subjective  I'm still on meds for the pneumonia but I'm better   Pertinent History pt with [facility] 12/15/2016;  pt completed inpt rehab as well as [place] [facility] and OT.  Pt had back surgery in 2015 with residual pain.  Pt had fallen three times since discharge home.    Patient Stated Goals get back to doing things the way I did them before   Currently in Pain? Yes   Pain Score 4    Pain Location Back   Pain Orientation Lower   Pain Descriptors / Indicators Aching   Pain Type Chronic pain   Pain Onset More than a month ago   Aggravating Factors  Standing or sitting too long   Pain Relieving Factors meds, rest, changing position                      OT Treatments/Exercises (OP) - 04/26/17  0001      ADLs   Functional Mobility Practiced carrying laundry basket, and folding towels. Practiced holding groceries and carrying as well as going up/down inclined surface and stepping off curb with close sup/CGA. Pt also simulated pushing groceries with utility cart going up/down inclined surface. Pt stood for at least 20 minutes w/o rest   Cooking Discussed safety with cooking and gave alternative safe options for getting things out of oven when family not there (including using 2 smaller casserole dishes instead of 1 large one, so one hand can get item out of oven safely and one hand can have countertop support for balance). Also discussed safety with taking larger pots off stovetop and recommended letting cool first and/or sliding on countertop vs. carrying and possibly spilling to prevent burns and falls.    Home Maintenance Pt practiced sweeping and shown 2 alternatives to getting dirt off floor  and sweeping into dustpan including: sitting or leaning body against counter when bending over                  OT Short Term Goals - 04/20/17 1528      OT SHORT TERM GOAL #1   Title Pt will be mod I with HEP - 03/29/2017   Status Achieved     OT SHORT TERM GOAL #2   Title Pt will demonstrat improved coordination as evidenced by decreasing time on 9 hole peg test by at least 5 seconds (baseline R= 29.53, L=52.25)   Baseline 03/30/17 R= 23.06, L = 35.93 see goal for eval baseline   Status Achieved  03/30/2017 R= 23.06, L = 35.93     OT SHORT TERM GOAL #3   Title Pt will demostrate ability to use LUE as non dominant 75% of the time for ADL and IADL tasks   Status Achieved     OT SHORT TERM GOAL #5   Title Pt will demonstrate ability to pick up 2 pound object off shelf x3 without dropping   Baseline 03/30/2017 able to lift 3 pound object x3     Status Achieved     OT SHORT TERM GOAL #6   Title Pt will be mod I with UB bathing AE prn   Status Achieved           OT Long Term Goals - 04/20/17 1529      OT LONG TERM GOAL #1   Title Pt will be mod I with upgraded HEP - 04/26/2017   Status On-going     OT LONG TERM GOAL #2   Title Pt will demonstrate improved coordination as evidenced by decreasing time on 9 hole peg by decreasing 20 seconds for LUE to assist with functional tasks (L=52.23)   Status Achieved  31.90     OT LONG TERM GOAL #3   Title Pt will be able to complete fine motor tasks bilaterally at mid reach for activity (using LUE as non dominant)   Status Achieved     OT LONG TERM GOAL #4   Title Pt will be mod I with cooking and laundry without device and no LOB   Status On-going     OT LONG TERM GOAL #5   Title Pt will be able to lift 4 pound weight off overhead shelf x3 without dropping   Status Achieved  6 pounds     OT LONG TERM GOAL #6   Title Pt will demonstrate improved grip strength by at least 10 pounds in LUE to  assist with functional tasks  (baseline= 35)   Baseline 03/30/2017 43 pounds   Status Achieved  62 pounds     OT LONG TERM GOAL #7   Title Pt will use LUE as non dominant 95% of the time in ADL and I ADL tasks.   Status Achieved     OT LONG TERM GOAL #8   Title Pt will tolerate at least 40 minutes of activity at ambulatory level without requiring a rest break.   Status On-going     OT LONG TERM GOAL  #9   Baseline Pt will be able to lift 6 pound object from low surface to counter height using BUE's (i.e lifting casserole out of oven)   Status Achieved  10 pound casserole simulated     OT LONG TERM GOAL  #10   TITLE Pt will be able to unhook shower curtain rings to take down shower curtain   Status Achieved               Plan - 04/26/17 1201    Clinical Impression Statement Pt progressing with functional mobility and safety with IADLS. Pt progressing towards goals   Rehab Potential Excellent   OT Frequency 2x / week   OT Duration 8 weeks   OT Treatment/Interventions Self-care/ADL training;Therapeutic exercise;Neuromuscular education;Energy conservation;DME and/or AE instruction;Manual Therapy;Therapist, nutritional;Therapeutic activities;Patient/family education;Balance training   Plan continue to work towards remaining LTG's   Consulted and Agree with Plan of Care Patient      Patient will benefit from skilled therapeutic intervention in order to improve the following deficits and impairments:  Decreased activity tolerance, Decreased balance, Decreased coordination, Decreased strength, Difficulty walking, Impaired UE functional use, Impaired tone  Visit Diagnosis: Unsteadiness on feet  Muscle weakness (generalized)    Problem List Patient Active Problem List   Diagnosis Date Noted  . Chronic constipation   . Acute idiopathic gout involving toe of right foot   . Acute pain of right knee   . Failed back syndrome   . Pain   . Chronic pain syndrome   . Labile blood pressure   .  Radicular pain   . Benign essential HTN   . Slow transit constipation   . Chronic bilateral low back pain without sciatica   . Dysphagia, post-stroke   . 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 Left hemiparesis ([place])   . Generalized weakness   . Acute ischemic stroke ([place]) 12/15/2016  . Urinary retention 02/25/2015  . Primary osteoarthritis of left knee 02/15/2015  . Morbid obesity ([place]) 02/15/2015  . Essential hypertension, benign 09/07/2014  . Constipation 09/07/2014  . Hyperlipidemia 09/05/2014  . Gout 09/05/2014  . Acute blood loss anemia 09/05/2014  . Neuropathy 09/05/2014  . UTI (urinary tract infection) 08/31/2014  . Radiculopathy 08/29/2014    Carey Bullocks, OTR/L 04/26/2017, 12:03 PM  [facility] [address] [place], [place], [address] Phone: [phone]   Fax:  [phone]  Name: Ellen Henry MRN: [id number] Date of Birth: [birth year]

## 2017-04-26 NOTE — Therapy (Signed)
[facility] [address] [place], [place], [address] Phone: [phone]   Fax:  [phone]  Physical Therapy Treatment  Patient Details  Name: Ellen Henry MRN: [id number] Date of Birth: 1946-02-21 Referring Provider: Antony Contras, MD  Encounter Date: 04/26/2017      PT End of Session - 04/26/17 1116    Visit Number 9   Number of Visits 16   Date for PT Re-Evaluation 04/30/17   Authorization Type G code every 10th visit   PT Start Time 1115  started pt earlier than her 11:45 apt time   PT Stop Time 1155   PT Time Calculation (min) 40 min   Equipment Utilized During Treatment Gait belt   Activity Tolerance Patient tolerated treatment well   Behavior During Therapy [facility] for tasks assessed/performed      Past Medical History:  Diagnosis Date  . Arthritis   . Dry skin   . GERD (gastroesophageal reflux disease)    "years ago", diet controlled  . Gout   . H/O pyelonephritis    as a child  . 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 Hypertension   . Neuropathy   . Pneumonia   . Radiculopathy   . Sleep apnea    does not use cpap  . Stroke [facility])     Past Surgical History:  Procedure Laterality Date  . ABDOMINAL EXPOSURE N/A 08/29/2014   Procedure: ABDOMINAL EXPOSURE;  Surgeon: Rosetta Posner, MD;  Location: [place];  Service: Vascular;  Laterality: N/A;  . ABDOMINAL HYSTERECTOMY    . ANTERIOR LUMBAR FUSION N/A 08/29/2014   Procedure: ANTERIOR LUMBAR FUSION 1 LEVEL;  Surgeon: Sinclair Ship, MD;  Location: [place];  Service: Orthopedics;  Laterality: N/A;  Lumbar 4-5 anterior lumbar interbody fusion with allograft and instrumentation.  . APPENDECTOMY    . BACK SURGERY  08/28/2014 and 08/29/2014   lumbar fusion  . CARPAL TUNNEL RELEASE Right    release done twice  . COLONOSCOPY    . JOINT REPLACEMENT    . left knee replacement    . REPLACEMENT TOTAL KNEE  2010   rigth knee  . TONSILLECTOMY     . TOTAL KNEE ARTHROPLASTY Left 02/15/2015   Procedure: TOTAL KNEE ARTHROPLASTY;  Surgeon: [name], MD;  Location: [place];  Service: Orthopedics;  Laterality: Left;    There were no vitals filed for this visit.      Subjective Assessment - 04/26/17 1115    Subjective No new complaints. No falls to report. Continues to report lower back pain (chronic in nature, not addressing directely with therapy).   Patient Stated Goals "Be able to use my left side withut falling, getting off of the walker, gaining strength."   Currently in Pain? Yes   Pain Score 4    Pain Location Back   Pain Orientation Lower   Pain Descriptors / Indicators Aching;Sore   Pain Type Chronic pain   Pain Onset More than a month ago   Pain Frequency Constant   Aggravating Factors  increased standing or sitting too long   Pain Relieving Factors meds, rest, changing positions             [facility] Adult PT Treatment/Exercise - 04/26/17 1117      Ambulation/Gait   Ambulation/Gait Yes   Ambulation/Gait Assistance 5: Supervision   Ambulation/Gait Assistance Details cues to increase base of support so to decr scissoring of feet at  times with gait, cues for increased left DF/hip and knee flexion for increased clearance with swing phase during gait. no significant balance loss occured.    Ambulation Distance (Feet) 450 Feet  x1, 500 x1   Assistive device Straight cane  with rubber quad tip   Gait Pattern Decreased stride length;Decreased arm swing - right;Decreased arm swing - left;Decreased step length - right;Decreased step length - left;Decreased stance time - left;Decreased weight shift to left;Antalgic   Ambulation Surface Level;Indoor;Unlevel;Outdoor;Paved     High Level Balance   High Level Balance Activities Side stepping;Marching forwards;Marching backwards;Tandem walking  tandem fwd/bwd, toe gait fwd/bwd, heel walk fwd/bwd   High Level Balance Comments on red mats next to counter top with intermittent UE  support for balance: 3 laps each with cues on form,/ex technique and min guard to min assist for balance.              Balance Exercises - 04/26/17 1145      Balance Exercises: Standing   SLS with Vectors Solid surface;Other reps (comment);Limitations   Balance Beam standing across red beam with no UE support: alternating fwd stepping to floor and back onto beam x 10 reps each leg, alternating bwd stepping to floor and back onto beam x 10 reps each side. min to mod assist for balance with cues on posture, weight shifting to assist balance recovery.      Balance Exercises: Standing   SLS with Vectors Limitations 2 tall cones on floor no UE support: alternating fwd toe taps x 10 reps each with up to mod assist for balance. cues on post             PT Short Term Goals - 04/09/17 1024      PT SHORT TERM GOAL #1   Title The patient will be indep with HEP for LE strength, balance and general mobility.   Baseline Met on 03/30/17   Time 4   Period Weeks   Status Achieved     PT SHORT TERM GOAL #2   Title The patient will improve Berg score from 39/56 to > or equal to 44/56 to demo improving standing balance.   Baseline Scores 47/56 on 03/30/17   Time 4   Period Weeks   Status Achieved     PT SHORT TERM GOAL #3   Title The patient will improve gait speed from 1.41 ft/sec to > or equal to 2.0 ft/sec without assistive device for improve mobility in the home.   Baseline Met on 03/30/17 scoring 2.14 ft/sec   Time 4   Period Weeks   Status Achieved     PT SHORT TERM GOAL #4   Title The patient will move floor<>stand with CGA with UE support due to recent falls.   Baseline Patient required supervision   Time 4   Period Weeks   Status Achieved           PT Long Term Goals - 03/01/17 1459      PT LONG TERM GOAL #1   Title The patient will improve SIS LE by 12% (baseline is 40.5%).   Baseline Target date 04/29/2017   Time 8   Period Weeks     PT LONG TERM GOAL #2    Title The patient will improve gait speed from 1.41 ft/sec to > or equal to 2.2 ft/sec to demo dec'ing risk for falls.   Baseline Target date 04/29/2017   Time 8   Period Weeks  PT LONG TERM GOAL #3   Title The patient will improve Berg from 39/56 to > or equal to 46/56 to demo dec'd risk for falls.   Baseline Target date 04/29/2017   Time 8   Period Weeks     PT LONG TERM GOAL #4   Title The patient will negotiate 4 steps without a handrail to negotiate home environment modified indep.   Baseline Target date 04/29/2017   Time 8   Period Weeks     PT LONG TERM GOAL #5   Title The patient will be indep with post d/c HEP.   Baseline Target date 04/29/2017   Time 8   Period Weeks           Plan - 04/26/17 1117    Clinical Impression Statement Today's skilled session continued to address gait, activity tolerance and balance. Pt continues to need increased assistance/support on compliant surfaces and with single leg stance activities. Pt is progressing towards goals and should benefit from continued PT to progress toward unmet goals.                          Rehab Potential Good   Clinical Impairments Affecting Rehab Potential h/o chronic back pain, patient is motivated to participate.   PT Frequency 2x / week   PT Duration 8 weeks   PT Treatment/Interventions ADLs/Self Care Home Management;Therapeutic activities;Therapeutic exercise;Neuromuscular re-education;Balance training;Gait training;Stair training;Functional mobility training;Patient/family education   PT Next Visit Plan G-code next visit; continue to work on gait with cane, balance activities, activity tolerance   Consulted and Agree with Plan of Care Patient      Patient will benefit from skilled therapeutic intervention in order to improve the following deficits and impairments:  Abnormal gait, Decreased balance, Decreased mobility, Decreased strength, Pain, Difficulty walking, Decreased activity tolerance  Visit  Diagnosis: Muscle weakness (generalized)  Unsteadiness on feet  Other abnormalities of gait and mobility     Problem List Patient Active Problem List   Diagnosis Date Noted  . Chronic constipation   . Acute idiopathic gout involving toe of right foot   . Acute pain of right knee   . Failed back syndrome   . Pain   . Chronic pain syndrome   . Labile blood pressure   . Radicular pain   . Benign essential HTN   . Slow transit constipation   . Chronic bilateral low back pain without sciatica   . Dysphagia, post-stroke   . 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 Left hemiparesis ([place])   . Generalized weakness   . Acute ischemic stroke ([place]) 12/15/2016  . Urinary retention 02/25/2015  . Primary osteoarthritis of left knee 02/15/2015  . Morbid obesity ([place]) 02/15/2015  . Essential hypertension, benign 09/07/2014  . Constipation 09/07/2014  . Hyperlipidemia 09/05/2014  . Gout 09/05/2014  . Acute blood loss anemia 09/05/2014  . Neuropathy 09/05/2014  . UTI (urinary tract infection) 08/31/2014  . Radiculopathy 08/29/2014    Willow Ora, PTA, [facility] [address], [place], [place] [address] [phone] 04/26/17, 12:53 PM   Name: Ellen Henry MRN: [id number] Date of Birth: 14-Mar-1946

## 2017-04-27 ENCOUNTER — Encounter: Payer: Self-pay | Admitting: Occupational Therapy

## 2017-04-28 DIAGNOSIS — E0800 Diabetes mellitus due to underlying condition with hyperosmolarity without nonketotic hyperglycemic-hyperosmolar coma (NKHHC): Secondary | ICD-10-CM | POA: Diagnosis not present

## 2017-04-28 DIAGNOSIS — M1250 Traumatic arthropathy, unspecified site: Secondary | ICD-10-CM | POA: Diagnosis not present

## 2017-04-28 DIAGNOSIS — I10 Essential (primary) hypertension: Secondary | ICD-10-CM | POA: Diagnosis not present

## 2017-05-04 ENCOUNTER — Ambulatory Visit: Payer: Medicare Other | Admitting: Rehabilitative and Restorative Service Providers"

## 2017-05-04 ENCOUNTER — Encounter: Payer: Self-pay | Admitting: Occupational Therapy

## 2017-05-04 ENCOUNTER — Ambulatory Visit: Payer: Medicare Other | Admitting: Occupational Therapy

## 2017-05-04 DIAGNOSIS — M6281 Muscle weakness (generalized): Secondary | ICD-10-CM | POA: Diagnosis not present

## 2017-05-04 DIAGNOSIS — I69354 Hemiplegia and hemiparesis following cerebral infarction affecting left non-dominant side: Secondary | ICD-10-CM

## 2017-05-04 DIAGNOSIS — R2681 Unsteadiness on feet: Secondary | ICD-10-CM | POA: Diagnosis not present

## 2017-05-04 DIAGNOSIS — R293 Abnormal posture: Secondary | ICD-10-CM | POA: Diagnosis not present

## 2017-05-04 DIAGNOSIS — R278 Other lack of coordination: Secondary | ICD-10-CM

## 2017-05-04 DIAGNOSIS — R2689 Other abnormalities of gait and mobility: Secondary | ICD-10-CM

## 2017-05-04 DIAGNOSIS — R29818 Other symptoms and signs involving the nervous system: Secondary | ICD-10-CM | POA: Diagnosis not present

## 2017-05-04 NOTE — Therapy (Signed)
[facility] [address] [place], [place], [address] Phone: [phone]   Fax:  [phone]  Occupational Therapy Treatment  Patient Details  Name: Ellen Henry MRN: [id number] Date of Birth: 04/26/1946 Referring Provider: Dr. Leonie Man  Encounter Date: 05/04/2017      OT End of Session - 05/04/17 1304    Visit Number 7   Number of Visits 16   Date for OT Re-Evaluation 05/25/17   Authorization Type medicare will need G code and PN every 10th visit   Authorization Time Period 05/25/2017 (goals revised and resubmitted therefore new 60 day cycle)   Authorization - Visit Number 7   Authorization - Number of Visits 10   OT Start Time 1021   OT Stop Time 1101   OT Time Calculation (min) 40 min   Activity Tolerance Patient tolerated treatment well      Past Medical History:  Diagnosis Date  . Arthritis   . Dry skin   . GERD (gastroesophageal reflux disease)    "years ago", diet controlled  . Gout   . H/O pyelonephritis    as a child  . 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 Hypertension   . Neuropathy   . Pneumonia   . Radiculopathy   . Sleep apnea    does not use cpap  . Stroke [facility])     Past Surgical History:  Procedure Laterality Date  . ABDOMINAL EXPOSURE N/A 08/29/2014   Procedure: ABDOMINAL EXPOSURE;  Surgeon: Rosetta Posner, MD;  Location: [facility];  Service: Vascular;  Laterality: N/A;  . ABDOMINAL HYSTERECTOMY    . ANTERIOR LUMBAR FUSION N/A 08/29/2014   Procedure: ANTERIOR LUMBAR FUSION 1 LEVEL;  Surgeon: Sinclair Ship, MD;  Location: [facility];  Service: Orthopedics;  Laterality: N/A;  Lumbar 4-5 anterior lumbar interbody fusion with allograft and instrumentation.  . APPENDECTOMY    . BACK SURGERY  08/28/2014 and 08/29/2014   lumbar fusion  . CARPAL TUNNEL RELEASE Right    release done twice  . COLONOSCOPY    . JOINT REPLACEMENT    . left knee replacement    . REPLACEMENT TOTAL  KNEE  2010   rigth knee  . TONSILLECTOMY    . TOTAL KNEE ARTHROPLASTY Left 02/15/2015   Procedure: TOTAL KNEE ARTHROPLASTY;  Surgeon: [name], MD;  Location: [facility];  Service: Orthopedics;  Laterality: Left;    There were no vitals filed for this visit.      Subjective Assessment - 05/04/17 1028    Subjective  I am really doing everthing at home that I used to do.    Pertinent History pt with [facility] 12/15/2016;  pt completed inpt rehab as well as [facility] and OT.  Pt had back surgery in 2015 with residual pain.  Pt had fallen three times since discharge home.    Patient Stated Goals get back to doing things the way I did them before   Currently in Pain? Yes   Pain Score 5    Pain Location Back   Pain Orientation Lower   Pain Descriptors / Indicators Aching   Pain Type Chronic pain   Pain Onset More than a month ago   Pain Frequency Constant   Aggravating Factors  increased standing or sitting too long I think it is partly due to the weather today. Overall I think my back pain is better since I am using the cane instead of  the walker   Pain Relieving Factors meds, rest, changing positions                      OT Treatments/Exercises (OP) - 05/04/17 0001      ADLs   ADL Comments Addressed activiity tolerance, balance, functional mobility all within the context of functional tasks including unloading dishes, doing laundry, carrying items from one location to another, wiping table, sweeping floor and other home mgmt tasks.  Pt did not use device (is not using one at home when doing these actvities) and had no LOB.                    OT Short Term Goals - 05/04/17 1303      OT SHORT TERM GOAL #1   Title Pt will be mod I with HEP - 03/29/2017   Status Achieved     OT SHORT TERM GOAL #2   Title Pt will demonstrat improved coordination as evidenced by decreasing time on 9 hole peg test by at least 5 seconds (baseline R= 29.53, L=52.25)   Baseline 03/30/17 R= 23.06,  L = 35.93 see goal for eval baseline   Status Achieved  03/30/2017 R= 23.06, L = 35.93     OT SHORT TERM GOAL #3   Title Pt will demostrate ability to use LUE as non dominant 75% of the time for ADL and IADL tasks   Status Achieved     OT SHORT TERM GOAL #5   Title Pt will demonstrate ability to pick up 2 pound object off shelf x3 without dropping   Baseline 03/30/2017 able to lift 3 pound object x3     Status Achieved     OT SHORT TERM GOAL #6   Title Pt will be mod I with UB bathing AE prn   Status Achieved           OT Long Term Goals - 05/04/17 1303      OT LONG TERM GOAL #1   Title Pt will be mod I with upgraded HEP - 04/26/2017   Status Achieved     OT LONG TERM GOAL #2   Title Pt will demonstrate improved coordination as evidenced by decreasing time on 9 hole peg by decreasing 20 seconds for LUE to assist with functional tasks (L=52.23)   Status Achieved  31.90     OT LONG TERM GOAL #3   Title Pt will be able to complete fine motor tasks bilaterally at mid reach for activity (using LUE as non dominant)   Status Achieved     OT LONG TERM GOAL #4   Title Pt will be mod I with cooking and laundry without device and no LOB   Status Achieved     OT LONG TERM GOAL #5   Title Pt will be able to lift 4 pound weight off overhead shelf x3 without dropping   Status Achieved  6 pounds     OT LONG TERM GOAL #6   Title Pt will demonstrate improved grip strength by at least 10 pounds in LUE to assist with functional tasks (baseline= 35)   Baseline 03/30/2017 43 pounds   Status Achieved  62 pounds     OT LONG TERM GOAL #7   Title Pt will use LUE as non dominant 95% of the time in ADL and I ADL tasks.   Status Achieved     OT LONG TERM GOAL #8   Title Pt  will tolerate at least 40 minutes of activity at ambulatory level without requiring a rest break.   Status Achieved  pt limited more by back pain than fatigue at this point     OT LONG TERM GOAL  #9   Baseline Pt will  be able to lift 6 pound object from low surface to counter height using BUE's (i.e lifting casserole out of oven)   Status Achieved  10 pound casserole simulated     OT LONG TERM GOAL  #10   TITLE Pt will be able to unhook shower curtain rings to take down shower curtain   Status Achieved               Plan - May 08, 2017 1304    Clinical Impression Statement Pt has met all goals and is now ready for d/c.     Rehab Potential Excellent   OT Frequency 2x / week   OT Duration 8 weeks   OT Treatment/Interventions Self-care/ADL training;Therapeutic exercise;Neuromuscular education;Energy conservation;DME and/or AE instruction;Manual Therapy;Therapist, nutritional;Therapeutic activities;Patient/family education;Balance training   Consulted and Agree with Plan of Care Patient   Plan D/c from OT pt in agreement      Patient will benefit from skilled therapeutic intervention in order to improve the following deficits and impairments:  Decreased activity tolerance, Decreased balance, Decreased coordination, Decreased strength, Difficulty walking, Impaired UE functional use, Impaired tone  Visit Diagnosis: Muscle weakness (generalized)  Unsteadiness on feet  Hemiplegia and hemiparesis following cerebral infarction affecting left non-dominant side (HCC)  Abnormal posture  Other lack of coordination      G-Codes - 05-08-17 1305    Functional Assessment Tool Used (Outpatient only) 9 hole peg, dynamometer, skilled clinical observation   Functional Limitation Carrying, moving and handling objects   Carrying, Moving and Handling Objects Current Status (H8887) At least 1 percent but less than 20 percent impaired, limited or restricted   Carrying, Moving and Handling Objects Goal Status (N7972) At least 20 percent but less than 40 percent impaired, limited or restricted   Carrying, Moving and Handling Objects Discharge Status [phone]) At least 1 percent but less than 20 percent  impaired, limited or restricted      Problem List Patient Active Problem List   Diagnosis Date Noted  . Chronic constipation   . Acute idiopathic gout involving toe of right foot   . Acute pain of right knee   . Failed back syndrome   . Pain   . Chronic pain syndrome   . Labile blood pressure   . Radicular pain   . Benign essential HTN   . Slow transit constipation   . Chronic bilateral low back pain without sciatica   . Dysphagia, post-stroke   . 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 Left hemiparesis ([facility] [place])   . Generalized weakness   . Acute ischemic stroke ([place]) 12/15/2016  . Urinary retention 02/25/2015  . Primary osteoarthritis of left knee 02/15/2015  . Morbid obesity ([place]) 02/15/2015  . Essential hypertension, benign 09/07/2014  . Constipation 09/07/2014  . Hyperlipidemia 09/05/2014  . Gout 09/05/2014  . Acute blood loss anemia 09/05/2014  . Neuropathy 09/05/2014  . UTI (urinary tract infection) 08/31/2014  . Radiculopathy 08/29/2014   OCCUPATIONAL THERAPY DISCHARGE SUMMARY  Visits from Start of Care: 7  Current functional level related to goals / functional outcomes: See above   Remaining deficits: Higher level balance deficits   Education / Equipment: HEP Plan: Patient agrees to discharge.  Patient goals were met.  Patient is being discharged due to meeting the stated rehab goals.  ?????      Quay Burow , OTR/L 05/04/2017, 1:06 PM  [facility] [address] [place], [place], [address] Phone: [phone]   Fax:  [phone]  Name: SIBEL KHURANA MRN: [id number] Date of Birth: 10/13/1946

## 2017-05-04 NOTE — Therapy (Signed)
[facility] [address] [place], [place], [address] Phone: [phone]   Fax:  [phone]  Physical Therapy Treatment  Patient Details  Name: Ellen Henry MRN: [id number] Date of Birth: 02-23-1946 Referring Provider: Antony Contras, MD  Encounter Date: 05/04/2017      PT End of Session - 05/04/17 1711    Visit Number 10   Number of Visits 16   Date for PT Re-Evaluation 04/30/17   Authorization Type G code every 10th visit   PT Start Time 1105   PT Stop Time 1145   PT Time Calculation (min) 40 min   Equipment Utilized During Treatment Gait belt   Activity Tolerance Patient tolerated treatment well   Behavior During Therapy [facility] for tasks assessed/performed      Past Medical History:  Diagnosis Date  . Arthritis   . Dry skin   . GERD (gastroesophageal reflux disease)    "years ago", diet controlled  . Gout   . H/O pyelonephritis    as a child  . 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 Hypertension   . Neuropathy   . Pneumonia   . Radiculopathy   . Sleep apnea    does not use cpap  . Stroke [facility])     Past Surgical History:  Procedure Laterality Date  . ABDOMINAL EXPOSURE N/A 08/29/2014   Procedure: ABDOMINAL EXPOSURE;  Surgeon: Rosetta Posner, MD;  Location: [facility];  Service: Vascular;  Laterality: N/A;  . ABDOMINAL HYSTERECTOMY    . ANTERIOR LUMBAR FUSION N/A 08/29/2014   Procedure: ANTERIOR LUMBAR FUSION 1 LEVEL;  Surgeon: Sinclair Ship, MD;  Location: [facility];  Service: Orthopedics;  Laterality: N/A;  Lumbar 4-5 anterior lumbar interbody fusion with allograft and instrumentation.  . APPENDECTOMY    . BACK SURGERY  08/28/2014 and 08/29/2014   lumbar fusion  . CARPAL TUNNEL RELEASE Right    release done twice  . COLONOSCOPY    . JOINT REPLACEMENT    . left knee replacement    . REPLACEMENT TOTAL KNEE  2010   rigth knee  . TONSILLECTOMY    . TOTAL KNEE ARTHROPLASTY Left  02/15/2015   Procedure: TOTAL KNEE ARTHROPLASTY;  Surgeon: [name], MD;  Location: [facility];  Service: Orthopedics;  Laterality: Left;    There were no vitals filed for this visit.      Subjective Assessment - 05/04/17 1108    Subjective The patient is using the walker or the cane for mobility.  She denies falls since last visit.  Her L knee catches at times and her back (chronic in nature) pain is aggravated with walking.  The left leg is still limited in mobility and she catches the toe sometimes with walking.   Patient Stated Goals "Be able to use my left side withut falling, getting off of the walker, gaining strength."   Currently in Pain? Yes   Pain Score 3    Pain Location Back   Pain Orientation Lower   Pain Descriptors / Indicators Aching   Pain Type Chronic pain   Pain Onset More than a month ago   Pain Frequency Constant   Aggravating Factors  increased stnading or sitting in one position.   Pain Relieving Factors rest, moving.            [facility] PT Assessment - 05/04/17 1115      Berg Balance Test   Sit to Stand Able  to stand without using hands and stabilize independently   Standing Unsupported Able to stand safely 2 minutes   Sitting with Back Unsupported but Feet Supported on Floor or Stool Able to sit safely and securely 2 minutes   Stand to Sit Sits safely with minimal use of hands   Transfers Able to transfer safely, minor use of hands   Standing Unsupported with Eyes Closed Able to stand 10 seconds safely   Standing Ubsupported with Feet Together Able to place feet together independently and stand 1 minute safely   From Standing, Reach Forward with Outstretched Arm Can reach confidently >25 cm (10")   From Standing Position, Pick up Object from Floor Able to pick up shoe safely and easily   From Standing Position, Turn to Look Behind Over each Shoulder Looks behind from both sides and weight shifts well   Turn 360 Degrees Able to turn 360 degrees safely in 4  seconds or less   Standing Unsupported, Alternately Place Feet on Step/Stool Able to complete 4 steps without aid or supervision   Standing Unsupported, One Foot in Front Able to take small step independently and hold 30 seconds   Standing on One Leg Tries to lift leg/unable to hold 3 seconds but remains standing independently   Total Score 49   Berg comment: 49/56 improved from evaluatoin score 39/56.                     [facility] Adult PT Treatment/Exercise - 05/04/17 1715      Self-Care   Self-Care Other Self-Care Comments   Other Self-Care Comments  PT and patient discussed HEP for strengthening, silver sneakers options for community.  She had signed up prior to stroke.  She notes family needs make regular exercise challenging and we discussed scheduling it as part of her routine like her PT appt.  Patient verbalizes understanding.       Exercises   Exercises Other Exercises   Other Exercises  Reviewed HEP and modified supine exercises to standing near countertop as patient reports she does not go back to bed once up.   See patient instructions for patient exercises.                 PT Education - 05/04/17 1137    Education provided Yes   Education Details HEP: reviewed all HEP and modified supine exercises to include standing instead.   Person(s) Educated Patient   Methods Explanation;Demonstration   Comprehension Verbalized understanding;Returned demonstration          PT Short Term Goals - 04/09/17 1024      PT SHORT TERM GOAL #1   Title The patient will be indep with HEP for LE strength, balance and general mobility.   Baseline Met on 03/30/17   Time 4   Period Weeks   Status Achieved     PT SHORT TERM GOAL #2   Title The patient will improve Berg score from 39/56 to > or equal to 44/56 to demo improving standing balance.   Baseline Scores 47/56 on 03/30/17   Time 4   Period Weeks   Status Achieved     PT SHORT TERM GOAL #3   Title The patient  will improve gait speed from 1.41 ft/sec to > or equal to 2.0 ft/sec without assistive device for improve mobility in the home.   Baseline Met on 03/30/17 scoring 2.14 ft/sec   Time 4   Period Weeks   Status Achieved  PT SHORT TERM GOAL #4   Title The patient will move floor<>stand with CGA with UE support due to recent falls.   Baseline Patient required supervision   Time 4   Period Weeks   Status Achieved           PT Long Term Goals - 05/04/17 1111      PT LONG TERM GOAL #1   Title The patient will improve SIS LE by 12% (baseline is 40.5%).   Baseline REVISED LTG DATE:  06/20/17   Time 4   Period Weeks   Status Revised     PT LONG TERM GOAL #2   Title The patient will improve gait speed from 1.41 ft/sec to > or equal to 2.2 ft/sec to demo dec'ing risk for falls.   Baseline 05/04/17:   2.20 ft/sec without device; 2.81 ft/sec with SPC   Time 8   Period Weeks   Status Achieved     PT LONG TERM GOAL #3   Title The patient will improve Berg from 39/56 to > or equal to 46/56 to demo dec'd risk for falls.   Baseline 05/04/17: 49/56.   Time 8   Period Weeks   Status Achieved     PT LONG TERM GOAL #4   Title The patient will negotiate 4 steps without a handrail to negotiate home environment modified indep.   Baseline REVISED LTG DATE:  06/20/17   Time 8   Period Weeks   Status Revised     PT LONG TERM GOAL #5   Title The patient will be indep with post d/c HEP.   Baseline REVISED LTG DATE:  06/20/17   Time 8   Period Weeks   Status Revised               Plan - 05/04/17 1713    Clinical Impression Statement PT to continue 3 LTGs and reduce frequency next week to 1x/week x 4 more weeks.  PT and patient discussed limitations with daily activities.  She notes L LE weakness as primary concern.  PT reviewed HEP and emphasized need for compliance to get full benefit from PT.  Also discussed need for community wellness program.     PT Frequency 1x / week   PT  Duration 4 weeks   PT Treatment/Interventions ADLs/Self Care Home Management;Therapeutic activities;Therapeutic exercise;Neuromuscular re-education;Balance training;Gait training;Stair training;Functional mobility training;Patient/family education   PT Next Visit Plan Renewed x 4 more weeks at 1x/week for continued L LE strength, community gait, balance activities and transition to community exercise.   Consulted and Agree with Plan of Care Patient      Patient will benefit from skilled therapeutic intervention in order to improve the following deficits and impairments:  Abnormal gait, Decreased balance, Decreased mobility, Decreased strength, Pain, Difficulty walking, Decreased activity tolerance  Visit Diagnosis: Muscle weakness (generalized)  Other abnormalities of gait and mobility  Unsteadiness on feet     Problem List Patient Active Problem List   Diagnosis Date Noted  . Chronic constipation   . Acute idiopathic gout involving toe of right foot   . Acute pain of right knee   . Failed back syndrome   . Pain   . Chronic pain syndrome   . Labile blood pressure   . Radicular pain   . Benign essential HTN   . Slow transit constipation   . Chronic bilateral low back pain without sciatica   . Dysphagia, post-stroke   . Stroke (cerebrum) ([place]) 12/17/2016  .  Left hemiparesis ([place])   . Generalized weakness   . Acute ischemic stroke ([place]) 12/15/2016  . Urinary retention 02/25/2015  . Primary osteoarthritis of left knee 02/15/2015  . Morbid obesity ([place]) 02/15/2015  . Essential hypertension, benign 09/07/2014  . Constipation 09/07/2014  . Hyperlipidemia 09/05/2014  . Gout 09/05/2014  . Acute blood loss anemia 09/05/2014  . Neuropathy 09/05/2014  . UTI (urinary tract infection) 08/31/2014  . Radiculopathy 08/29/2014    WEAVER,CHRISTINA, PT 05/04/2017, 5:17 PM  [facility] [address] [place], [place],  [address] Phone: [phone]   Fax:  [phone]  Name: Ellen Henry MRN: [id number] Date of Birth: September 15, 1946

## 2017-05-04 NOTE — Patient Instructions (Signed)
  Ankle Alphabet    Using left ankle and foot only, trace the letters of the alphabet. Perform A to Z. Repeat _1___ times per set.  Do __1_ sessions per day.  http://orth.exer.us/17   Copyright  VHI. All rights reserved  Hip Flexion, Knee Straight    Lift right straight leg forward and up __8__ inches.  STAND TALL emphasizing belly button towards your spine.  Repeat __10__ times per session. Do _1___ sessions per week.  Copyright  VHI. All rights reserved.    "I love a Parade" Lift    Using a chair if necessary, march in place 10 times.  Rest and repeat 10 more on each leg (alternating). Do _1-2___ sessions per day.  http://gt2.exer.us/345   Copyright  VHI. All rights reserved.   AMBULATION: Side Step    Step sideways. Repeat in opposite direction.  BE NEAR A COUNTERTOP, BUT NOT HOLDING. 10 steps to each side.  Repeat 1-2 times/day.  Copyright  VHI. All rights reserved.    Heel Raises    Stand with support--LET GO AS YOU CAN SAFELY.  Tighten pelvic floor and hold. With knees straight, raise heels off ground. Hold _ 3-5_ seconds.Repeat _20__ times. Do __1-2_ times a day.  Copyright  VHI. All rights reserved.   Feet Partial Heel-Toe, Varied Arm Positions - Eyes Closed    Stand with right foot partially in front of the other and arms out. Close eyes and visualize upright position. Hold __30__ seconds. Repeat __3__ times alternating feet per session. Do __1-2__ sessions per day.  Copyright  VHI. All rights reserved.

## 2017-05-06 ENCOUNTER — Encounter: Payer: Self-pay | Admitting: Occupational Therapy

## 2017-05-10 ENCOUNTER — Encounter: Payer: Self-pay | Admitting: Occupational Therapy

## 2017-05-10 ENCOUNTER — Ambulatory Visit: Payer: Medicare Other | Attending: Neurology | Admitting: Rehabilitative and Restorative Service Providers"

## 2017-05-10 DIAGNOSIS — M6281 Muscle weakness (generalized): Secondary | ICD-10-CM | POA: Insufficient documentation

## 2017-05-10 DIAGNOSIS — R2681 Unsteadiness on feet: Secondary | ICD-10-CM | POA: Insufficient documentation

## 2017-05-10 DIAGNOSIS — R2689 Other abnormalities of gait and mobility: Secondary | ICD-10-CM | POA: Diagnosis not present

## 2017-05-10 NOTE — Therapy (Signed)
[facility] [address] [place], [place], [address] Phone: [phone]   Fax:  [phone]  Physical Therapy Treatment  Patient Details  Name: Ellen Henry MRN: [id number] Date of Birth: Oct 22, 1946 Referring Provider: Antony Contras, MD  Encounter Date: 05/10/2017      PT End of Session - 05/10/17 1137    Visit Number 11   Number of Visits 16   Date for PT Re-Evaluation 04/30/17   Authorization Type G code every 10th visit   PT Start Time 1104   PT Stop Time 1145   PT Time Calculation (min) 41 min   Equipment Utilized During Treatment Gait belt   Activity Tolerance Patient tolerated treatment well   Behavior During Therapy [facility] for tasks assessed/performed      Past Medical History:  Diagnosis Date  . Arthritis   . Dry skin   . GERD (gastroesophageal reflux disease)    "years ago", diet controlled  . Gout   . H/O pyelonephritis    as a child  . 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 Hypertension   . Neuropathy   . Pneumonia   . Radiculopathy   . Sleep apnea    does not use cpap  . Stroke [facility])     Past Surgical History:  Procedure Laterality Date  . ABDOMINAL EXPOSURE N/A 08/29/2014   Procedure: ABDOMINAL EXPOSURE;  Surgeon: Rosetta Posner, MD;  Location: [facility];  Service: Vascular;  Laterality: N/A;  . ABDOMINAL HYSTERECTOMY    . ANTERIOR LUMBAR FUSION N/A 08/29/2014   Procedure: ANTERIOR LUMBAR FUSION 1 LEVEL;  Surgeon: Sinclair Ship, MD;  Location: [facility];  Service: Orthopedics;  Laterality: N/A;  Lumbar 4-5 anterior lumbar interbody fusion with allograft and instrumentation.  . APPENDECTOMY    . BACK SURGERY  08/28/2014 and 08/29/2014   lumbar fusion  . CARPAL TUNNEL RELEASE Right    release done twice  . COLONOSCOPY    . JOINT REPLACEMENT    . left knee replacement    . REPLACEMENT TOTAL KNEE  2010   rigth knee  . TONSILLECTOMY    . TOTAL KNEE ARTHROPLASTY Left 02/15/2015    Procedure: TOTAL KNEE ARTHROPLASTY;  Surgeon: [name], MD;  Location: [facility];  Service: Orthopedics;  Laterality: Left;    There were no vitals filed for this visit.      Subjective Assessment - 05/10/17 1106    Subjective The patient notes that she had a busy weekend.     Patient Stated Goals "Be able to use my left side withut falling, getting off of the walker, gaining strength."   Currently in Pain? Yes   Pain Score 3    Pain Location Back   Pain Orientation Lower   Pain Descriptors / Indicators Aching   Pain Type Chronic pain   Pain Onset More than a month ago   Pain Frequency Constant   Aggravating Factors  being in one position   Pain Relieving Factors rest, moving                         [facility] Adult PT Treatment/Exercise - 05/10/17 1130      Ambulation/Gait   Ambulation/Gait Yes   Ambulation/Gait Assistance 6: Modified independent (Device/Increase time)   Ambulation/Gait Assistance Details with SPC, patient is modified indep.  PT provides supervision when without device.   Ambulation Distance (Feet) 400 Feet  x 3 reps   Assistive device Straight cane;None   Ambulation Surface Level;Indoor;Outdoor   Stairs Yes   Stairs Assistance 5: Supervision   Stairs Assistance Details (indicate cue type and reason) Patient and PT worked on ascending/descending with SPC and no handrails in forward and sidestepping pattern.  Patient needs demo and cues for cane placement.  PT and patient discussed stair negotiation in community--recommend supervision from family if no handrails.   Number of Stairs 12   Gait Comments Treadmill x 3 minutes 0.9 mph with 3% incline.   Community ambulation negotiation paved/level surfaces and grass + rubber mulch with supervision.  Patient slows pace with community gait and does not demo any loss of balance.  Indoor dynamic gait with ball toss, marching, direction changes.     Neuro Re-ed    Neuro Re-ed Details  Alternating LE foot  taps t o6" step and 12" step with dec'ing UE support.                    PT Short Term Goals - 04/09/17 1024      PT SHORT TERM GOAL #1   Title The patient will be indep with HEP for LE strength, balance and general mobility.   Baseline Met on 03/30/17   Time 4   Period Weeks   Status Achieved     PT SHORT TERM GOAL #2   Title The patient will improve Berg score from 39/56 to > or equal to 44/56 to demo improving standing balance.   Baseline Scores 47/56 on 03/30/17   Time 4   Period Weeks   Status Achieved     PT SHORT TERM GOAL #3   Title The patient will improve gait speed from 1.41 ft/sec to > or equal to 2.0 ft/sec without assistive device for improve mobility in the home.   Baseline Met on 03/30/17 scoring 2.14 ft/sec   Time 4   Period Weeks   Status Achieved     PT SHORT TERM GOAL #4   Title The patient will move floor<>stand with CGA with UE support due to recent falls.   Baseline Patient required supervision   Time 4   Period Weeks   Status Achieved           PT Long Term Goals - 05/04/17 1111      PT LONG TERM GOAL #1   Title The patient will improve SIS LE by 12% (baseline is 40.5%).   Baseline REVISED LTG DATE:  06/20/17   Time 4   Period Weeks   Status Revised     PT LONG TERM GOAL #2   Title The patient will improve gait speed from 1.41 ft/sec to > or equal to 2.2 ft/sec to demo dec'ing risk for falls.   Baseline 05/04/17:   2.20 ft/sec without device; 2.81 ft/sec with SPC   Time 8   Period Weeks   Status Achieved     PT LONG TERM GOAL #3   Title The patient will improve Berg from 39/56 to > or equal to 46/56 to demo dec'd risk for falls.   Baseline 05/04/17: 49/56.   Time 8   Period Weeks   Status Achieved     PT LONG TERM GOAL #4   Title The patient will negotiate 4 steps without a handrail to negotiate home environment modified indep.   Baseline REVISED LTG DATE:  06/20/17   Time 8   Period Weeks   Status Revised  PT LONG  TERM GOAL #5   Title The patient will be indep with post d/c HEP.   Baseline REVISED LTG DATE:  06/20/17   Time 8   Period Weeks   Status Revised               Plan - 05/10/17 1504    Clinical Impression Statement The patient has not yet checked on gym routine due to busy schedule.  She is modified indep with community mobility on paved surfaces, however needs close supervision on stairs without rails and unlevel surfaces.    PT Treatment/Interventions ADLs/Self Care Home Management;Therapeutic activities;Therapeutic exercise;Neuromuscular re-education;Balance training;Gait training;Stair training;Functional mobility training;Patient/family education   PT Next Visit Plan 1x/week working on L LE strength, community gait, balance.   Consulted and Agree with Plan of Care Patient      Patient will benefit from skilled therapeutic intervention in order to improve the following deficits and impairments:  Abnormal gait, Decreased balance, Decreased mobility, Decreased strength, Pain, Difficulty walking, Decreased activity tolerance  Visit Diagnosis: Muscle weakness (generalized)  Other abnormalities of gait and mobility  Unsteadiness on feet     Problem List Patient Active Problem List   Diagnosis Date Noted  . Chronic constipation   . Acute idiopathic gout involving toe of right foot   . Acute pain of right knee   . Failed back syndrome   . Pain   . Chronic pain syndrome   . Labile blood pressure   . Radicular pain   . Benign essential HTN   . Slow transit constipation   . Chronic bilateral low back pain without sciatica   . Dysphagia, post-stroke   . 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 Left hemiparesis ([place])   . Generalized weakness   . Acute ischemic stroke ([place]) 12/15/2016  . Urinary retention 02/25/2015  . Primary osteoarthritis of left knee 02/15/2015  . Morbid obesity ([place]) 02/15/2015  . Essential hypertension, benign 09/07/2014  . Constipation 09/07/2014   . Hyperlipidemia 09/05/2014  . Gout 09/05/2014  . Acute blood loss anemia 09/05/2014  . Neuropathy 09/05/2014  . UTI (urinary tract infection) 08/31/2014  . Radiculopathy 08/29/2014    WEAVER,CHRISTINA, PT 05/10/2017, 3:06 PM  [facility] [address] [place], [place], [address] Phone: [phone]   Fax:  [phone]  Name: Ellen Henry MRN: [id number] Date of Birth: 08/13/1946

## 2017-05-11 ENCOUNTER — Encounter (HOSPITAL_COMMUNITY): Payer: Self-pay | Admitting: *Deleted

## 2017-05-11 ENCOUNTER — Emergency Department (HOSPITAL_COMMUNITY)
Admission: EM | Admit: 2017-05-11 | Discharge: 2017-05-11 | Disposition: A | Discharge status: Home / Self Care | Payer: Medicare Other | Attending: Emergency Medicine | Admitting: Emergency Medicine

## 2017-05-11 ENCOUNTER — Emergency Department (HOSPITAL_COMMUNITY): Payer: Medicare Other

## 2017-05-11 DIAGNOSIS — Z96653 Presence of artificial knee joint, bilateral: Secondary | ICD-10-CM | POA: Insufficient documentation

## 2017-05-11 DIAGNOSIS — Z79899 Other long term (current) drug therapy: Secondary | ICD-10-CM | POA: Insufficient documentation

## 2017-05-11 DIAGNOSIS — Z8673 Personal history of transient ischemic attack (TIA), and cerebral infarction without residual deficits: Secondary | ICD-10-CM | POA: Diagnosis not present

## 2017-05-11 DIAGNOSIS — M542 Cervicalgia: Secondary | ICD-10-CM | POA: Insufficient documentation

## 2017-05-11 DIAGNOSIS — I10 Essential (primary) hypertension: Secondary | ICD-10-CM | POA: Diagnosis not present

## 2017-05-11 DIAGNOSIS — Z7982 Long term (current) use of aspirin: Secondary | ICD-10-CM | POA: Diagnosis not present

## 2017-05-11 DIAGNOSIS — Z87891 Personal history of nicotine dependence: Secondary | ICD-10-CM | POA: Diagnosis not present

## 2017-05-11 DIAGNOSIS — I700 Atherosclerosis of aorta: Secondary | ICD-10-CM | POA: Diagnosis not present

## 2017-05-11 LAB — CBC WITH DIFFERENTIAL/PLATELET
Basophils Absolute: 0 10*3/uL (ref 0.0–0.1)
Basophils Relative: 0 %
Eosinophils Absolute: 0.1 10*3/uL (ref 0.0–0.7)
Eosinophils Relative: 1 %
HCT: 38.9 % (ref 36.0–46.0)
Hemoglobin: 13 g/dL (ref 12.0–15.0)
Lymphocytes Relative: 49 %
Lymphs Abs: 4.3 10*3/uL — ABNORMAL HIGH (ref 0.7–4.0)
MCH: 29.1 pg (ref 26.0–34.0)
MCHC: 33.4 g/dL (ref 30.0–36.0)
MCV: 87.2 fL (ref 78.0–100.0)
Monocytes Absolute: 0.4 10*3/uL (ref 0.1–1.0)
Monocytes Relative: 5 %
Neutro Abs: 3.9 10*3/uL (ref 1.7–7.7)
Neutrophils Relative %: 45 %
Platelets: ADEQUATE 10*3/uL (ref 150–400)
RBC: 4.46 MIL/uL (ref 3.87–5.11)
RDW: 13.8 % (ref 11.5–15.5)
WBC: 8.7 10*3/uL (ref 4.0–10.5)

## 2017-05-11 LAB — BASIC METABOLIC PANEL
Anion gap: 9 (ref 5–15)
BUN: 12 mg/dL (ref 6–20)
CO2: 24 mmol/L (ref 22–32)
Calcium: 9.6 mg/dL (ref 8.9–10.3)
Chloride: 104 mmol/L (ref 101–111)
Creatinine, Ser: 0.68 mg/dL (ref 0.44–1.00)
GFR calc Af Amer: >60 mL/min (ref >60)
GFR calc non Af Amer: >60 mL/min (ref >60)
Glucose, Bld: 94 mg/dL (ref 65–99)
Potassium: 4.5 mmol/L (ref 3.5–5.1)
Sodium: 137 mmol/L (ref 135–145)

## 2017-05-11 MED ORDER — TRAMADOL HCL 50 MG PO TABS
50.0000 mg | ORAL_TABLET | Freq: Once | ORAL | Status: AC
  Administered 2017-05-11: 50 mg via ORAL
  Filled 2017-05-11: qty 1

## 2017-05-11 MED ORDER — METHOCARBAMOL 500 MG PO TABS: 500.0000 mg | ORAL_TABLET | Freq: Two times a day (BID) | ORAL | 0 refills | Status: DC

## 2017-05-11 MED ORDER — HYDROCODONE-ACETAMINOPHEN 7.5-325 MG/15ML PO SOLN
10.0000 mL | Freq: Once | ORAL | Status: AC
  Administered 2017-05-11: 10 mL via ORAL
  Filled 2017-05-11: qty 15

## 2017-05-11 MED ORDER — IOPAMIDOL (ISOVUE-370) INJECTION 76%
INTRAVENOUS | Status: AC
  Administered 2017-05-11: 50 mL
  Filled 2017-05-11: qty 50

## 2017-05-11 MED ORDER — TRAMADOL HCL 50 MG PO TABS: 50.0000 mg | ORAL_TABLET | Freq: Four times a day (QID) | ORAL | 0 refills | Status: DC | PRN

## 2017-05-11 NOTE — ED Notes (Signed)
Pt is in stable condition upon d/c and is escorted from ED via wheelchair. 

## 2017-05-11 NOTE — ED Notes (Signed)
Patient transported to CT 

## 2017-05-11 NOTE — Discharge Instructions (Signed)
Please read attached information. If you experience any new or worsening signs or symptoms please return to the emergency room for evaluation. Please follow-up with your primary care provider or specialist as discussed. Please use medication prescribed only as directed and discontinue taking if you have any concerning signs or symptoms.   °

## 2017-05-11 NOTE — ED Provider Notes (Signed)
[facility] DEPT Provider Note   CSN: [id number] Arrival date & time: 05/11/17  1119   By signing my name below, I, Soijett Blue, attest that this documentation has been prepared under the direction and in the presence of Lenn Sink, PA-C Electronically Signed: Soijett Blue, ED Scribe. 05/11/17. 12:43 PM.  History   Chief Complaint Chief Complaint  Patient presents with  . Neck Pain    HPI Ellen Henry is a 71 y.o. female with a PMHx of HTN, who presents to the Emergency Department complaining of sudden-onset, sharp, intermittent, right sided neck pain x 2 hours ago. Pt reports associated posterior HA and recurrent right upper arm pain. Pt has tried her home medications (gabapentin and tylenol) with no relief of her symptoms. Pt right sided neck pain is worsened with movement. She notes that she was at the grocery store and lifted a gallon of milk prior to the onset of her symptoms. She states that she had a stroke 5 months ago with residual left arm weakness. Denies recent heavy lifting. She denies numbness, tingling, weakness, and any other symptoms.     The history is provided by the patient. No language interpreter was used.    Past Medical History:  Diagnosis Date  . Arthritis   . Dry skin   . GERD (gastroesophageal reflux disease)    "years ago", diet controlled  . Gout   . H/O pyelonephritis    as a child  . 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 Hypertension   . Neuropathy   . Pneumonia   . Radiculopathy   . Sleep apnea    does not use cpap  . Stroke [facility])     Patient Active Problem List   Diagnosis Date Noted  . Chronic constipation   . Acute idiopathic gout involving toe of right foot   . Acute pain of right knee   . Failed back syndrome   . Pain   . Chronic pain syndrome   . Labile blood pressure   . Radicular pain   . Benign essential HTN   . Slow transit constipation   . Chronic bilateral low back pain without sciatica   .  Dysphagia, post-stroke   . Stroke (cerebrum) ([place]) 12/17/2016  . Left hemiparesis ([place])   . Generalized weakness   . Acute ischemic stroke ([place]) 12/15/2016  . Urinary retention 02/25/2015  . Primary osteoarthritis of left knee 02/15/2015  . Morbid obesity ([place]) 02/15/2015  . Essential hypertension, benign 09/07/2014  . Constipation 09/07/2014  . Hyperlipidemia 09/05/2014  . Gout 09/05/2014  . Acute blood loss anemia 09/05/2014  . Neuropathy 09/05/2014  . UTI (urinary tract infection) 08/31/2014  . Radiculopathy 08/29/2014    Past Surgical History:  Procedure Laterality Date  . ABDOMINAL EXPOSURE N/A 08/29/2014   Procedure: ABDOMINAL EXPOSURE;  Surgeon: Rosetta Posner, MD;  Location: [facility];  Service: Vascular;  Laterality: N/A;  . ABDOMINAL HYSTERECTOMY    . ANTERIOR LUMBAR FUSION N/A 08/29/2014   Procedure: ANTERIOR LUMBAR FUSION 1 LEVEL;  Surgeon: Sinclair Ship, MD;  Location: [place];  Service: Orthopedics;  Laterality: N/A;  Lumbar 4-5 anterior lumbar interbody fusion with allograft and instrumentation.  . APPENDECTOMY    . BACK SURGERY  08/28/2014 and 08/29/2014   lumbar fusion  . CARPAL TUNNEL RELEASE Right    release done twice  . COLONOSCOPY    . JOINT REPLACEMENT    . left knee replacement    .  REPLACEMENT TOTAL KNEE  2010   rigth knee  . TONSILLECTOMY    . TOTAL KNEE ARTHROPLASTY Left 02/15/2015   Procedure: TOTAL KNEE ARTHROPLASTY;  Surgeon: [name], MD;  Location: [place];  Service: Orthopedics;  Laterality: Left;    OB History    Gravida Para Term Preterm AB Living   0 0 0 0 0     SAB TAB Ectopic Multiple Live Births   0 0 0           Home Medications    Prior to Admission medications   Medication Sig Start Date End Date Taking? Authorizing Provider  acetaminophen (TYLENOL) 325 MG tablet Take 1-2 tablets (325-650 mg total) by mouth every 6 (six) hours as needed for mild pain. 12/31/16   Love, Ivan Anchors, PA-C  aspirin EC 325 MG EC tablet Take 1 tablet  (325 mg total) by mouth daily. 12/17/16   Reyne Dumas, MD  atorvastatin (LIPITOR) 40 MG tablet Take 1 tablet (40 mg total) by mouth daily at 6 PM. 02/18/17 03/20/17  Garvin Fila, MD  colchicine 0.6 MG tablet Take 1 tablet (0.6 mg total) by mouth daily. Patient taking differently: Take 0.6 mg by mouth daily as needed (pain).  03/23/16   Deno Etienne, DO  diclofenac sodium (VOLTAREN) 1 % GEL Apply 2 g topically 4 (four) times daily. 12/31/16   Love, Ivan Anchors, PA-C  FLUoxetine (PROZAC) 20 MG capsule Take 1 capsule (20 mg total) by mouth daily. 01/07/17   Jamse Arn, MD  gabapentin (NEURONTIN) 600 MG tablet TAKE 1 TABLET BY MOUTH 3 TIMES A DAY 03/17/17   Patel, Domenick Bookbinder, MD  lidocaine (LIDODERM) 5 % Apply at 6 am and remove at 6 pm daily. 12/31/16   Love, Ivan Anchors, PA-C  methocarbamol (ROBAXIN) 500 MG tablet Take 1 tablet (500 mg total) by mouth 2 (two) times daily. 05/11/17   Hedges, Dellis Filbert, PA-C  nicotine (NICODERM CQ - DOSED IN MG/24 HOURS) 14 mg/24hr patch Place 102 patches (1,430 mg total) onto the skin daily. 12/31/16   Love, Ivan Anchors, PA-C  telmisartan-hydrochlorothiazide (MICARDIS HCT) 40-12.5 MG tablet Take 1 tablet by mouth daily.    [provider]  traMADol (ULTRAM) 50 MG tablet Take 1 tablet (50 mg total) by mouth every 6 (six) hours as needed. 05/11/17   Okey Regal, PA-C    Family History Family History  Problem Relation Age of Onset  . Varicose Veins Mother     Social History Social History  Substance Use Topics  . Smoking status: Former Smoker    Packs/day: 0.25    Types: Cigarettes    Quit date: 12/14/2016  . Smokeless tobacco: Never Used     Comment: use a patch  . Alcohol use No     Allergies   Food allergy formula   Review of Systems Review of Systems  Musculoskeletal: Positive for arthralgias (right upper arm) and neck pain (right sided).  Neurological: Positive for headaches (posterior). Negative for weakness and numbness.       No tingling  All  other systems reviewed and are negative.    Physical Exam Updated Vital Signs BP (!) 125/91 (BP Location: Right Arm)   Pulse 76   Temp 97.9 F (36.6 C) (Oral)   Resp 20   SpO2 95%   Physical Exam  Constitutional: She is oriented to person, place, and time. She appears well-developed and well-nourished. No distress.  HENT:  Head: Normocephalic and atraumatic.  Eyes:  EOM are normal.  Neck: Neck supple. Muscular tenderness present.  Exquisite TTP of the right posterior cervical musculature. Pain with movement of the neck in any direction. No redness or warmth to touch. Left side of neck non-TTP.   Cardiovascular: Normal rate.   Pulmonary/Chest: Effort normal. No respiratory distress.  Abdominal: She exhibits no distension.  Musculoskeletal: Normal range of motion.  Neurological: She is alert and oriented to person, place, and time.  Decreased sensation to LUE and LLE that is baseline per patient. Strength equal bilaterally. Right side with normal sensation, strength, and motor function.   Skin: Skin is warm and dry.  Psychiatric: She has a normal mood and affect. Her behavior is normal.  Nursing note and vitals reviewed.    ED Treatments / Results  DIAGNOSTIC STUDIES: Oxygen Saturation is 95% on RA, adequate by my interpretation.    COORDINATION OF CARE: 12:38 PM Discussed treatment plan with pt at bedside which includes hycet, CT C-spine, and pt agreed to plan.   Labs (all labs ordered are listed, but only abnormal results are displayed) Labs Reviewed  CBC WITH DIFFERENTIAL/PLATELET - Abnormal; Notable for the following:       Result Value   Lymphs Abs 4.3 (*)    All other components within normal limits  BASIC METABOLIC PANEL    EKG  EKG Interpretation None       Radiology Ct Angio Head W Or Wo Contrast  Result Date: 05/11/2017 CLINICAL DATA:  RIGHT-SIDED NECK PAIN AND SPASMS EXAM: CT ANGIOGRAPHY HEAD AND NECK TECHNIQUE: Multidetector CT imaging of the head  and neck was performed using the standard protocol during bolus administration of intravenous contrast. Multiplanar CT image reconstructions and MIPs were obtained to evaluate the vascular anatomy. Carotid stenosis measurements (when applicable) are obtained utilizing NASCET criteria, using the distal internal carotid diameter as the denominator. CONTRAST:  50 ML ISOVUE 370 COMPARISON:  None. FINDINGS: CT HEAD FINDINGS Brain: No mass lesion, intraparenchymal hemorrhage or extra-axial collection. No evidence of acute cortical infarct. Old bilateral basal ganglia lacunar infarcts. Vascular: No hyperdense vessel or unexpected calcification. Skull: Normal visualized skull base, calvarium and extracranial soft tissues. Sinuses/Orbits: No sinus fluid levels or advanced mucosal thickening. No mastoid effusion. Normal orbits. CTA NECK FINDINGS Aortic arch: There is no aneurysm or dissection of the visualized ascending aorta or aortic arch. There is a normal variant aortic arch branching pattern with the brachiocephalic and left common carotid arteries sharing a common origin. The visualized proximal subclavian arteries are normal. There is atherosclerotic calcification within the the aortic arch. Right carotid system: The right common carotid origin is widely patent. There is no common carotid or internal carotid artery dissection or aneurysm. Mild bifurcation atherosclerosis without hemodynamically significant stenosis. Left carotid system: The left common carotid origin is widely patent. There is no common carotid or internal carotid artery dissection or aneurysm. Mild bifurcation atherosclerosis without hemodynamically significant stenosis. Vertebral arteries: The vertebral system is codominant. Both vertebral artery origins are normal. Both vertebral arteries are normal to their confluence with the basilar artery. Skeleton: There is no bony spinal canal stenosis. No lytic or blastic lesions. Other neck: The nasopharynx  is clear. The oropharynx and hypopharynx are normal. The epiglottis is normal. The supraglottic larynx, glottis and subglottic larynx are normal. No retropharyngeal collection. The parapharyngeal spaces are preserved. The parotid and submandibular glands are normal. No sialolithiasis or salivary ductal dilatation. The thyroid gland is normal. There is no cervical lymphadenopathy. Upper chest: No pneumothorax or  pleural effusion. No nodules or masses. Review of the MIP images confirms the above findings CTA HEAD FINDINGS Anterior circulation: --Intracranial internal carotid arteries: Bilateral atherosclerotic calcification of the skullbase without significant stenosis. --Anterior cerebral arteries: There is an azygos variant anterior cerebral artery. The right A1 segment is congenitally absent, which is a normal variant. --Middle cerebral arteries: Normal. --Posterior communicating arteries: Absent bilaterally. Posterior circulation: --Posterior cerebral arteries: Normal. --Superior cerebellar arteries: Normal. --Basilar artery: Normal. --Anterior inferior cerebellar arteries: Normal. --Posterior inferior cerebellar arteries: Normal. Venous sinuses: As permitted by contrast timing, patent. Anatomic variants: Azygos anterior cerebral artery and absent right A1 segment. Delayed phase: No parenchymal contrast enhancement. Review of the MIP images confirms the above findings IMPRESSION: 1. No emergent large vessel occlusion or high-grade stenosis. 2. No carotid or vertebral artery dissection. 3. Mild bilateral carotid bifurcation atherosclerosis without hemodynamically significant stenosis. 4. Aortic atherosclerosis. Electronically Signed   By: Ulyses Jarred M.D.   On: 05/11/2017 18:18   Ct Angio Neck W Or Wo Contrast  Result Date: 05/11/2017 CLINICAL DATA:  RIGHT-SIDED NECK PAIN AND SPASMS EXAM: CT ANGIOGRAPHY HEAD AND NECK TECHNIQUE: Multidetector CT imaging of the head and neck was performed using the standard  protocol during bolus administration of intravenous contrast. Multiplanar CT image reconstructions and MIPs were obtained to evaluate the vascular anatomy. Carotid stenosis measurements (when applicable) are obtained utilizing NASCET criteria, using the distal internal carotid diameter as the denominator. CONTRAST:  50 ML ISOVUE 370 COMPARISON:  None. FINDINGS: CT HEAD FINDINGS Brain: No mass lesion, intraparenchymal hemorrhage or extra-axial collection. No evidence of acute cortical infarct. Old bilateral basal ganglia lacunar infarcts. Vascular: No hyperdense vessel or unexpected calcification. Skull: Normal visualized skull base, calvarium and extracranial soft tissues. Sinuses/Orbits: No sinus fluid levels or advanced mucosal thickening. No mastoid effusion. Normal orbits. CTA NECK FINDINGS Aortic arch: There is no aneurysm or dissection of the visualized ascending aorta or aortic arch. There is a normal variant aortic arch branching pattern with the brachiocephalic and left common carotid arteries sharing a common origin. The visualized proximal subclavian arteries are normal. There is atherosclerotic calcification within the the aortic arch. Right carotid system: The right common carotid origin is widely patent. There is no common carotid or internal carotid artery dissection or aneurysm. Mild bifurcation atherosclerosis without hemodynamically significant stenosis. Left carotid system: The left common carotid origin is widely patent. There is no common carotid or internal carotid artery dissection or aneurysm. Mild bifurcation atherosclerosis without hemodynamically significant stenosis. Vertebral arteries: The vertebral system is codominant. Both vertebral artery origins are normal. Both vertebral arteries are normal to their confluence with the basilar artery. Skeleton: There is no bony spinal canal stenosis. No lytic or blastic lesions. Other neck: The nasopharynx is clear. The oropharynx and hypopharynx  are normal. The epiglottis is normal. The supraglottic larynx, glottis and subglottic larynx are normal. No retropharyngeal collection. The parapharyngeal spaces are preserved. The parotid and submandibular glands are normal. No sialolithiasis or salivary ductal dilatation. The thyroid gland is normal. There is no cervical lymphadenopathy. Upper chest: No pneumothorax or pleural effusion. No nodules or masses. Review of the MIP images confirms the above findings CTA HEAD FINDINGS Anterior circulation: --Intracranial internal carotid arteries: Bilateral atherosclerotic calcification of the skullbase without significant stenosis. --Anterior cerebral arteries: There is an azygos variant anterior cerebral artery. The right A1 segment is congenitally absent, which is a normal variant. --Middle cerebral arteries: Normal. --Posterior communicating arteries: Absent bilaterally. Posterior circulation: --Posterior cerebral arteries: Normal. --Superior  cerebellar arteries: Normal. --Basilar artery: Normal. --Anterior inferior cerebellar arteries: Normal. --Posterior inferior cerebellar arteries: Normal. Venous sinuses: As permitted by contrast timing, patent. Anatomic variants: Azygos anterior cerebral artery and absent right A1 segment. Delayed phase: No parenchymal contrast enhancement. Review of the MIP images confirms the above findings IMPRESSION: 1. No emergent large vessel occlusion or high-grade stenosis. 2. No carotid or vertebral artery dissection. 3. Mild bilateral carotid bifurcation atherosclerosis without hemodynamically significant stenosis. 4. Aortic atherosclerosis. Electronically Signed   By: Ulyses Jarred M.D.   On: 05/11/2017 18:18    Procedures Procedures (including critical care time)  Medications Ordered in ED Medications  HYDROcodone-acetaminophen (HYCET) 7.5-325 mg/15 ml solution 10 mL (10 mLs Oral Given 05/11/17 1249)  iopamidol (ISOVUE-370) 76 % injection (50 mLs  Contrast Given 05/11/17 1728)    traMADol (ULTRAM) tablet 50 mg (50 mg Oral Given 05/11/17 1901)     Initial Impression / Assessment and Plan / ED Course  I have reviewed the triage vital signs and the nursing notes.  Pertinent labs & imaging results that were available during my care of the patient were reviewed by me and considered in my medical decision making (see chart for details).     Labs:   Imaging: CT angio neck/head  Consults:  Therapeutics: Hydrocodone, tramadol  Discharge Meds: Methocarbamol, Ultram  Assessment/Plan: Pt presentation consistent with muscular strain. No neuro deficits. No acute findings on imaging here. Pt improved slightly with medicines given in the ED. Will be discharged home with PCP follow up. Pt given strict return precautions and is safe for discharge at this time.   Final Clinical Impressions(s) / ED Diagnoses   Final diagnoses:  Neck pain    New Prescriptions Discharge Medication List as of 05/11/2017  6:43 PM    START taking these medications   Details  methocarbamol (ROBAXIN) 500 MG tablet Take 1 tablet (500 mg total) by mouth 2 (two) times daily., Starting Tue 05/11/2017, Print    traMADol (ULTRAM) 50 MG tablet Take 1 tablet (50 mg total) by mouth every 6 (six) hours as needed., Starting Tue 05/11/2017, Print      I personally performed the services described in this documentation, which was scribed in my presence. The recorded information has been reviewed and is accurate.    Okey Regal, PA-C 05/11/17 [place]a Iha    Pattricia Boss, MD 05/16/17 [phone]

## 2017-05-11 NOTE — ED Triage Notes (Signed)
To ED for eval of right neck pain and spasms. States this tightness started this am after going to grocery. Only thing pt can recall is lifting a gallon of milk with her right arm. States her left arm is weak from a stroke in Jan. No stroke symptoms. If pt turns head she will cry in pain.

## 2017-05-13 ENCOUNTER — Encounter: Payer: Self-pay | Admitting: Occupational Therapy

## 2017-05-13 DIAGNOSIS — S161XXA Strain of muscle, fascia and tendon at neck level, initial encounter: Secondary | ICD-10-CM | POA: Diagnosis not present

## 2017-05-21 ENCOUNTER — Ambulatory Visit: Payer: Medicare Other | Admitting: Rehabilitative and Restorative Service Providers"

## 2017-05-21 DIAGNOSIS — M6281 Muscle weakness (generalized): Secondary | ICD-10-CM | POA: Diagnosis not present

## 2017-05-21 DIAGNOSIS — R2689 Other abnormalities of gait and mobility: Secondary | ICD-10-CM

## 2017-05-21 DIAGNOSIS — R2681 Unsteadiness on feet: Secondary | ICD-10-CM | POA: Diagnosis not present

## 2017-05-21 DIAGNOSIS — R768 Other specified abnormal immunological findings in serum: Secondary | ICD-10-CM | POA: Diagnosis not present

## 2017-05-21 DIAGNOSIS — R748 Abnormal levels of other serum enzymes: Secondary | ICD-10-CM | POA: Diagnosis not present

## 2017-05-21 NOTE — Patient Instructions (Addendum)
Healthy Back - Shoulder Roll    Stand straight with arms relaxed at sides. Roll shoulders backward continuously. Do _5-10___ times.  This exercise can also be done one shoulder at a time.  Copyright  VHI. All rights reserved.   Extensors, Supine    Lie supine, head on small, rolled towel or one pillow.  Gently tuck chin and bring toward chest. Hold __5_ seconds. Repeat __10_ times per session. Do _2__ sessions per day.  Copyright  VHI. All rights reserved.

## 2017-05-21 NOTE — Therapy (Signed)
[facility] [address] [place], [place], [address] Phone: [phone]   Fax:  [phone]  Physical Therapy Treatment  Patient Details  Name: Ellen Henry MRN: [id number] Date of Birth: 1946-09-11 Referring Provider: Antony Contras, MD  Encounter Date: 05/21/2017      PT End of Session - 05/21/17 1251    Visit Number 12   Number of Visits 16   Date for PT Re-Evaluation 06/20/17   Authorization Type G code every 10th visit   PT Start Time 1235   PT Stop Time 1315   PT Time Calculation (min) 40 min   Equipment Utilized During Treatment Gait belt   Activity Tolerance Patient tolerated treatment well   Behavior During Therapy [facility] for tasks assessed/performed      Past Medical History:  Diagnosis Date  . Arthritis   . Dry skin   . GERD (gastroesophageal reflux disease)    "years ago", diet controlled  . Gout   . H/O pyelonephritis    as a child  . 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 Hypertension   . Neuropathy   . Pneumonia   . Radiculopathy   . Sleep apnea    does not use cpap  . Stroke [facility])     Past Surgical History:  Procedure Laterality Date  . ABDOMINAL EXPOSURE N/A 08/29/2014   Procedure: ABDOMINAL EXPOSURE;  Surgeon: Rosetta Posner, MD;  Location: [facility];  Service: Vascular;  Laterality: N/A;  . ABDOMINAL HYSTERECTOMY    . ANTERIOR LUMBAR FUSION N/A 08/29/2014   Procedure: ANTERIOR LUMBAR FUSION 1 LEVEL;  Surgeon: Sinclair Ship, MD;  Location: [facility];  Service: Orthopedics;  Laterality: N/A;  Lumbar 4-5 anterior lumbar interbody fusion with allograft and instrumentation.  . APPENDECTOMY    . BACK SURGERY  08/28/2014 and 08/29/2014   lumbar fusion  . CARPAL TUNNEL RELEASE Right    release done twice  . COLONOSCOPY    . JOINT REPLACEMENT    . left knee replacement    . REPLACEMENT TOTAL KNEE  2010   rigth knee  . TONSILLECTOMY    . TOTAL KNEE ARTHROPLASTY Left  02/15/2015   Procedure: TOTAL KNEE ARTHROPLASTY;  Surgeon: [name], MD;  Location: [facility];  Service: Orthopedics;  Laterality: Left;    There were no vitals filed for this visit.      Subjective Assessment - 05/21/17 1237    Subjective The patient reports that she lifted a heavy jug of bleach Tuesday morning and got right sided neck pain.  She notes she got evaluated to ensure not a new stroke.  She notes 2/10 current pain level.  She had 3 falls this week.    Patient Stated Goals "Be able to use my left side withut falling, getting off of the walker, gaining strength."   Currently in Pain? Yes   Pain Score 2    Pain Location Neck   Pain Orientation Right   Pain Descriptors / Indicators Aching   Pain Type Acute pain   Pain Onset In the past 7 days   Pain Frequency Constant   Aggravating Factors  turning   Pain Relieving Factors rest                         [facility] Adult PT Treatment/Exercise - 05/21/17 1240      Ambulation/Gait   Ambulation/Gait Yes   Ambulation/Gait Assistance  6: Modified independent (Device/Increase time);5: Supervision   Ambulation/Gait Assistance Details with SPC and without device indoors   Ambulation Distance (Feet) 500 Feet   Assistive device Straight cane;None   Ambulation Surface Level;Indoor;Outdoor;Paved;Grass;Gravel   Stairs Yes   Stairs Assistance 4: Min guard   Stairs Assistance Details (indicate cue type and reason) Patient and PT worked on ascending/descending steps without handrails using a step to pattern.  Patient has dec'd single limb stance control with noted hip abductor weakness   Number of Stairs 4   Gait Comments Patient negotiated rubber mulch, grass surfaces, paved lots, curbs, with SPC and supervision today.      Self-Care   Self-Care Other Self-Care Comments   Other Self-Care Comments  The patient had a fall last Friday (jumped up quickly to get ready to take grandson to school) and felt due to moving too quickly,  tripped on dog last week, and then fell when stepping over stuff in the floor when her daughter was cleaning.     Neuro Re-ed    Neuro Re-ed Details  Standing rocker board step downs x 10 reps each leg dec'ing UE support, marching in place with intermittent UE support, lateral weight shifting moving fromm bilateral stance to unilateral support with CGA to min A.  Alternating foot taps to 6" and 12 " surfaces dec'ing UE support.      Exercises   Exercises Other Exercises   Other Exercises  Sidestepping x 10 feet x 6 reps adding overhead reach to engage core.    PT also provided 2 neck exercises:  shoulder rolls for gentle ROM/tension relief, and chin tucks.  Tried upper trap stretching with some increase in discomfort.  Did not add that exercise for home.  Recommended we finish PT for post CVA and refer to ortho clinic for further neck evaluation if needed.  Patient's pain already improved from >10/10 at onset to 2/10 today.                PT Education - 05/21/17 1619    Education provided Yes   Education Details HEP: shoulder roll and chin tucks   Person(s) Educated Patient   Methods Explanation;Demonstration;Handout   Comprehension Verbalized understanding;Returned demonstration          PT Short Term Goals - 04/09/17 1024      PT SHORT TERM GOAL #1   Title The patient will be indep with HEP for LE strength, balance and general mobility.   Baseline Met on 03/30/17   Time 4   Period Weeks   Status Achieved     PT SHORT TERM GOAL #2   Title The patient will improve Berg score from 39/56 to > or equal to 44/56 to demo improving standing balance.   Baseline Scores 47/56 on 03/30/17   Time 4   Period Weeks   Status Achieved     PT SHORT TERM GOAL #3   Title The patient will improve gait speed from 1.41 ft/sec to > or equal to 2.0 ft/sec without assistive device for improve mobility in the home.   Baseline Met on 03/30/17 scoring 2.14 ft/sec   Time 4   Period Weeks   Status  Achieved     PT SHORT TERM GOAL #4   Title The patient will move floor<>stand with CGA with UE support due to recent falls.   Baseline Patient required supervision   Time 4   Period Weeks   Status Achieved  PT Long Term Goals - 05/04/17 1111      PT LONG TERM GOAL #1   Title The patient will improve SIS LE by 12% (baseline is 40.5%).   Baseline REVISED LTG DATE:  06/20/17   Time 4   Period Weeks   Status Revised     PT LONG TERM GOAL #2   Title The patient will improve gait speed from 1.41 ft/sec to > or equal to 2.2 ft/sec to demo dec'ing risk for falls.   Baseline 05/04/17:   2.20 ft/sec without device; 2.81 ft/sec with SPC   Time 8   Period Weeks   Status Achieved     PT LONG TERM GOAL #3   Title The patient will improve Berg from 39/56 to > or equal to 46/56 to demo dec'd risk for falls.   Baseline 05/04/17: 49/56.   Time 8   Period Weeks   Status Achieved     PT LONG TERM GOAL #4   Title The patient will negotiate 4 steps without a handrail to negotiate home environment modified indep.   Baseline REVISED LTG DATE:  06/20/17   Time 8   Period Weeks   Status Revised     PT LONG TERM GOAL #5   Title The patient will be indep with post d/c HEP.   Baseline REVISED LTG DATE:  06/20/17   Time 8   Period Weeks   Status Revised               Plan - 05/21/17 1619    Clinical Impression Statement The patient arrived today reporting 3 falls (when hurrying in her home), as well as neck pain after lifting a heavy jug of bleach.  Patient has an order for neck pain eval and treat from her primary care MD.  Due to plan of care for CVA ending in next 1-2 weeks, PT recommended she seek eval for neck pain at [facility] ortho clinic.  I provided her with 2 exercises today for gentle stretching/mobility.   PT Treatment/Interventions ADLs/Self Care Home Management;Therapeutic activities;Therapeutic exercise;Neuromuscular re-education;Balance training;Gait training;Stair  training;Functional mobility training;Patient/family education   PT Next Visit Plan check on neck pain, L LE strength, community gait, balance activities, hip abductor strengthening   Consulted and Agree with Plan of Care Patient      Patient will benefit from skilled therapeutic intervention in order to improve the following deficits and impairments:  Abnormal gait, Decreased balance, Decreased mobility, Decreased strength, Pain, Difficulty walking, Decreased activity tolerance  Visit Diagnosis: Muscle weakness (generalized)  Other abnormalities of gait and mobility  Unsteadiness on feet     Problem List Patient Active Problem List   Diagnosis Date Noted  . Chronic constipation   . Acute idiopathic gout involving toe of right foot   . Acute pain of right knee   . Failed back syndrome   . Pain   . Chronic pain syndrome   . Labile blood pressure   . Radicular pain   . Benign essential HTN   . Slow transit constipation   . Chronic bilateral low back pain without sciatica   . Dysphagia, post-stroke   . 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 Left hemiparesis ([place])   . Generalized weakness   . Acute ischemic stroke ([place]) 12/15/2016  . Urinary retention 02/25/2015  . Primary osteoarthritis of left knee 02/15/2015  . Morbid obesity ([place]) 02/15/2015  . Essential hypertension, benign 09/07/2014  . Constipation 09/07/2014  . Hyperlipidemia 09/05/2014  .  Gout 09/05/2014  . Acute blood loss anemia 09/05/2014  . Neuropathy 09/05/2014  . UTI (urinary tract infection) 08/31/2014  . Radiculopathy 08/29/2014    WEAVER,CHRISTINA, PT 05/21/2017, 4:49 PM  [facility] [address] [place], [place], [address] Phone: [phone]   Fax:  [phone]  Name: Ellen Henry MRN: [id number] Date of Birth: 05-Nov-1946

## 2017-05-26 ENCOUNTER — Ambulatory Visit: Payer: Medicare Other | Admitting: Rehabilitative and Restorative Service Providers"

## 2017-05-26 DIAGNOSIS — R2681 Unsteadiness on feet: Secondary | ICD-10-CM

## 2017-05-26 DIAGNOSIS — M6281 Muscle weakness (generalized): Secondary | ICD-10-CM | POA: Diagnosis not present

## 2017-05-26 DIAGNOSIS — R2689 Other abnormalities of gait and mobility: Secondary | ICD-10-CM

## 2017-05-26 NOTE — Patient Instructions (Signed)
Iliotibial Band Stretch, Standing    Stand, hands on hips, one leg crossed in front of other leg. Lean to same side as front leg until stretch is felt on other hip. Hold __20_ seconds. Change foot position and lean to same side. Hold _20__ seconds. Repeat __3_ times per session. Do _1-2__ sessions per day.  Copyright  VHI. All rights reserved.   Can use a foam roller or a rolling pin to gently roll over outside of thigh to your tolerance.  This will help lengthen the muscle.

## 2017-05-26 NOTE — Therapy (Signed)
[facility] [address] [place], [place], [address] Phone: [phone]   Fax:  [phone]  Physical Therapy Treatment  Patient Details  Name: Ellen Henry MRN: [id number] Date of Birth: [birth year] Referring Provider: Antony Contras, MD  Encounter Date: 05/26/2017      PT End of Session - 05/26/17 1115    Visit Number 13   Number of Visits 16   Date for PT Re-Evaluation 06/20/17   Authorization Type G code every 10th visit   PT Start Time 1108   PT Stop Time 1150   PT Time Calculation (min) 42 min   Equipment Utilized During Treatment Gait belt   Activity Tolerance Patient tolerated treatment well   Behavior During Therapy [facility] for tasks assessed/performed      Past Medical History:  Diagnosis Date  . Arthritis   . Dry skin   . GERD (gastroesophageal reflux disease)    "years ago", diet controlled  . Gout   . H/O pyelonephritis    as a child  . 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 Hypertension   . Neuropathy   . Pneumonia   . Radiculopathy   . Sleep apnea    does not use cpap  . Stroke [facility])     Past Surgical History:  Procedure Laterality Date  . ABDOMINAL EXPOSURE N/A 08/29/2014   Procedure: ABDOMINAL EXPOSURE;  Surgeon: Rosetta Posner, MD;  Location: [facility];  Service: Vascular;  Laterality: N/A;  . ABDOMINAL HYSTERECTOMY    . ANTERIOR LUMBAR FUSION N/A 08/29/2014   Procedure: ANTERIOR LUMBAR FUSION 1 LEVEL;  Surgeon: Sinclair Ship, MD;  Location: [facility];  Service: Orthopedics;  Laterality: N/A;  Lumbar 4-5 anterior lumbar interbody fusion with allograft and instrumentation.  . APPENDECTOMY    . BACK SURGERY  08/28/2014 and 08/29/2014   lumbar fusion  . CARPAL TUNNEL RELEASE Right    release done twice  . COLONOSCOPY    . JOINT REPLACEMENT    . left knee replacement    . REPLACEMENT TOTAL KNEE  2010   rigth knee  . TONSILLECTOMY    . TOTAL KNEE ARTHROPLASTY Left  02/15/2015   Procedure: TOTAL KNEE ARTHROPLASTY;  Surgeon: [name], MD;  Location: [facility];  Service: Orthopedics;  Laterality: Left;    There were no vitals filed for this visit.      Subjective Assessment - 05/26/17 1111    Subjective The patient noted some soreness after stretch (for upper trapezius) in PT last session.   No falls since last treatment session. .  Overall, patient feel she is doing well.  She is interested in getting her own apartment but notes not trusting her left side all the way.   Patient Stated Goals "Be able to use my left side withut falling, getting off of the walker, gaining strength."   Currently in Pain? Yes   Pain Score --  neck 0/10 at rest, back pain 3/10--PT to monitor, no goals to follow                         [facility] Adult PT Treatment/Exercise - 05/26/17 1133      Ambulation/Gait   Ambulation/Gait Yes   Ambulation/Gait Assistance 6: Modified independent (Device/Increase time)   Ambulation Distance (Feet) 300 Feet   Assistive device Straight cane;None   Ambulation Surface Level;Indoor   Stairs Yes   Stairs Assistance 6:  Modified independent (Device/Increase time)   Stairs Assistance Details (indicate cue type and reason) patient demonstrated ability to perform step to pattern without device or rail at [place]   Number of Stairs 8   Gait Comments Patient demo'd improved stair negotiation today without rails using step to pattern.      Neuro Re-ed    Neuro Re-ed Details  Marching in place with dec'ing UE support,.     Exercises   Exercises Lumbar;Knee/Hip   Other Exercises  Sidestepping x 10 feet x 4 reps without UEs.  Standing knee flexion x 10 reps each side.      Lumbar Exercises: Stretches   Active Hamstring Stretch --     Lumbar Exercises: Standing   Other Standing Lumbar Exercises Standing L LE strengthening moving R LE in/out to target for L weight shift + unilateral stance control/strengthening.     Knee/Hip  Exercises: Stretches   ITB Stretch Right;2 reps;30 seconds   ITB Stretch Limitations With passive overpressure.  Supine with R LE crossing midline to stretch with knee extended.      Knee/Hip Exercises: Standing   Lateral Step Up 10 reps;Right;Left;1 set   Forward Step Up 10 reps;Right;Left;1 set     Knee/Hip Exercises: Sidelying   Hip ABduction 2 sets;10 reps;Strengthening;Right;Left                PT Education - 05/26/17 1204    Education provided Yes   Education Details HEP: IT Band stretch   Person(s) Educated Patient   Methods Explanation;Demonstration;Handout   Comprehension Verbalized understanding;Returned demonstration          PT Short Term Goals - 04/09/17 1024      PT SHORT TERM GOAL #1   Title The patient will be indep with HEP for LE strength, balance and general mobility.   Baseline Met on 03/30/17   Time 4   Period Weeks   Status Achieved     PT SHORT TERM GOAL #2   Title The patient will improve Berg score from 39/56 to > or equal to 44/56 to demo improving standing balance.   Baseline Scores 47/56 on 03/30/17   Time 4   Period Weeks   Status Achieved     PT SHORT TERM GOAL #3   Title The patient will improve gait speed from 1.41 ft/sec to > or equal to 2.0 ft/sec without assistive device for improve mobility in the home.   Baseline Met on 03/30/17 scoring 2.14 ft/sec   Time 4   Period Weeks   Status Achieved     PT SHORT TERM GOAL #4   Title The patient will move floor<>stand with CGA with UE support due to recent falls.   Baseline Patient required supervision   Time 4   Period Weeks   Status Achieved           PT Long Term Goals - 05/04/17 1111      PT LONG TERM GOAL #1   Title The patient will improve SIS LE by 12% (baseline is 40.5%).   Baseline REVISED LTG DATE:  06/20/17   Time 4   Period Weeks   Status Revised     PT LONG TERM GOAL #2   Title The patient will improve gait speed from 1.41 ft/sec to > or equal to 2.2  ft/sec to demo dec'ing risk for falls.   Baseline 05/04/17:   2.20 ft/sec without device; 2.81 ft/sec with SPC   Time 8  Period Weeks   Status Achieved     PT LONG TERM GOAL #3   Title The patient will improve Berg from 39/56 to > or equal to 46/56 to demo dec'd risk for falls.   Baseline 05/04/17: 49/56.   Time 8   Period Weeks   Status Achieved     PT LONG TERM GOAL #4   Title The patient will negotiate 4 steps without a handrail to negotiate home environment modified indep.   Baseline REVISED LTG DATE:  06/20/17   Time 8   Period Weeks   Status Revised     PT LONG TERM GOAL #5   Title The patient will be indep with post d/c HEP.   Baseline REVISED LTG DATE:  06/20/17   Time 8   Period Weeks   Status Revised               Plan - 05/26/17 1205    Clinical Impression Statement The patient notes continued sensation of left leg weakness.  She was found to have tight ITBand today R worse than L side.  Her hip abductor strength is limited bilaterally with right more weak per observation of hip abduction sidelying.  PT encouraged continued strengthening/stretching to improve gait.   Rehab Potential Good   Clinical Impairments Affecting Rehab Potential h/o chronic back pain, patient is motivated to participate.   PT Frequency 1x / week   PT Duration 4 weeks   PT Treatment/Interventions ADLs/Self Care Home Management;Therapeutic activities;Therapeutic exercise;Neuromuscular re-education;Balance training;Gait training;Stair training;Functional mobility training;Patient/family education   PT Next Visit Plan neck stretching, check remaining goals, hip abductor strengthening.  Ensure understanding of post d/c exercise/community wellness routine   Consulted and Agree with Plan of Care Patient      Patient will benefit from skilled therapeutic intervention in order to improve the following deficits and impairments:  Abnormal gait, Decreased balance, Decreased mobility, Decreased  strength, Pain, Difficulty walking, Decreased activity tolerance  Visit Diagnosis: Muscle weakness (generalized)  Other abnormalities of gait and mobility  Unsteadiness on feet     Problem List Patient Active Problem List   Diagnosis Date Noted  . Chronic constipation   . Acute idiopathic gout involving toe of right foot   . Acute pain of right knee   . Failed back syndrome   . Pain   . Chronic pain syndrome   . Labile blood pressure   . Radicular pain   . Benign essential HTN   . Slow transit constipation   . Chronic bilateral low back pain without sciatica   . Dysphagia, post-stroke   . 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 Left hemiparesis ([place])   . Generalized weakness   . Acute ischemic stroke ([place]) 12/15/2016  . Urinary retention 02/25/2015  . Primary osteoarthritis of left knee 02/15/2015  . Morbid obesity ([place]) 02/15/2015  . Essential hypertension, benign 09/07/2014  . Constipation 09/07/2014  . Hyperlipidemia 09/05/2014  . Gout 09/05/2014  . Acute blood loss anemia 09/05/2014  . Neuropathy 09/05/2014  . UTI (urinary tract infection) 08/31/2014  . Radiculopathy 08/29/2014    WEAVER,CHRISTINA 05/26/2017, 12:12 PM  [facility] [address] [place], [place], [address] Phone: [phone]   Fax:  [phone]  Name: RAMA SORCI MRN: [id number] Date of Birth: 1946/06/09

## 2017-06-07 ENCOUNTER — Ambulatory Visit: Payer: Medicare Other | Attending: Neurology | Admitting: Rehabilitative and Restorative Service Providers"

## 2017-06-07 DIAGNOSIS — M6281 Muscle weakness (generalized): Secondary | ICD-10-CM | POA: Insufficient documentation

## 2017-06-07 DIAGNOSIS — R2681 Unsteadiness on feet: Secondary | ICD-10-CM | POA: Insufficient documentation

## 2017-06-07 DIAGNOSIS — R2689 Other abnormalities of gait and mobility: Secondary | ICD-10-CM | POA: Diagnosis not present

## 2017-06-07 NOTE — Patient Instructions (Addendum)
  [phone] 

## 2017-06-07 NOTE — Addendum Note (Signed)
Addended by: Rudell Cobb M on: 06/07/2017 05:45 PM   Modules accepted: Orders

## 2017-06-07 NOTE — Therapy (Signed)
[facility] [address] [place], [place], [address] Phone: [phone]   Fax:  [phone]  Physical Therapy Treatment and Discharge Summary  Patient Details  Name: Ellen Henry MRN: [id number] Date of Birth: Apr 18, 1946 Referring Provider: Antony Contras, MD  Encounter Date: 06/07/2017      PT End of Session - 06/07/17 1058    Visit Number 14   Number of Visits 16   Date for PT Re-Evaluation 06/20/17   Authorization Type G code every 10th visit   PT Start Time 1022   PT Stop Time 1052   PT Time Calculation (min) 30 min   Equipment Utilized During Treatment --   Activity Tolerance Patient tolerated treatment well   Behavior During Therapy [facility] for tasks assessed/performed      Past Medical History:  Diagnosis Date  . Arthritis   . Dry skin   . GERD (gastroesophageal reflux disease)    "years ago", diet controlled  . Gout   . H/O pyelonephritis    as a child  . 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 Hypertension   . Neuropathy   . Pneumonia   . Radiculopathy   . Sleep apnea    does not use cpap  . Stroke [facility])     Past Surgical History:  Procedure Laterality Date  . ABDOMINAL EXPOSURE N/A 08/29/2014   Procedure: ABDOMINAL EXPOSURE;  Surgeon: Rosetta Posner, MD;  Location: [place];  Service: Vascular;  Laterality: N/A;  . ABDOMINAL HYSTERECTOMY    . ANTERIOR LUMBAR FUSION N/A 08/29/2014   Procedure: ANTERIOR LUMBAR FUSION 1 LEVEL;  Surgeon: Sinclair Ship, MD;  Location: [place];  Service: Orthopedics;  Laterality: N/A;  Lumbar 4-5 anterior lumbar interbody fusion with allograft and instrumentation.  . APPENDECTOMY    . BACK SURGERY  08/28/2014 and 08/29/2014   lumbar fusion  . CARPAL TUNNEL RELEASE Right    release done twice  . COLONOSCOPY    . JOINT REPLACEMENT    . left knee replacement    . REPLACEMENT TOTAL KNEE  2010   rigth knee  . TONSILLECTOMY    . TOTAL KNEE ARTHROPLASTY  Left 02/15/2015   Procedure: TOTAL KNEE ARTHROPLASTY;  Surgeon: [name], MD;  Location: [place];  Service: Orthopedics;  Laterality: Left;    There were no vitals filed for this visit.      Subjective Assessment - 06/07/17 1020    Subjective The patient notes that her neck is still bothering her, noting pain worse with turning.    She reports "all in all its not that bad, but it's not that comfortable."    She is doing HEP for LE strengthening and mobility and has not had time to go to gym for wellness routine.    Patient Stated Goals "Be able to use my left side withut falling, getting off of the walker, gaining strength."   Currently in Pain? Yes   Pain Score 3    Pain Location Neck   Pain Orientation Right   Pain Descriptors / Indicators Aching   Pain Type Acute pain   Pain Onset 1 to 4 weeks ago   Pain Frequency Constant   Aggravating Factors  turning her head makes it worse   Pain Relieving Factors rest            [facility] PT Assessment - 06/07/17 1035      Observation/Other Assessments   Focus  on Therapeutic Outcomes (FOTO)  53%   Stroke Impact Scale  44.4% * was neuro QOL scale                     [facility] Adult PT Treatment/Exercise - 06/07/17 1035      Ambulation/Gait   Ambulation/Gait Yes   Ambulation/Gait Assistance 6: Modified independent (Device/Increase time)   Ambulation/Gait Assistance Details no cane x 2 minutes nonstop, and then with cane x 2.5 minutes nonstop   Ambulation Distance (Feet) 500 Feet   Assistive device Straight cane;None   Ambulation Surface Level;Indoor   Gait velocity 2.5 ft/sec   Stairs Yes   Stairs Assistance 6: Modified independent (Device/Increase time)   Stairs Assistance Details (indicate cue type and reason) patient able to perform stairs with SPC mod indep x 8 steps with step to pattern.    Number of Stairs 12   Gait Comments Patient able to perform step to pattern wtihout device on stairs without rails.  Patient does  mod indep slowing pace.     Self-Care   Self-Care Other Self-Care Comments   Other Self-Care Comments  Discussed community exercise recommending silver sneakers program and providing information for [facility] aquatic center.     Exercises   Exercises Lumbar;Knee/Hip     Knee/Hip Exercises: Stretches   ITB Stretch Right;Left;2 reps;30 seconds   ITB Stretch Limitations reviewed technique in standing                PT Education - 06/07/17 1050    Education provided Yes   Education Details [facility]   [employer]) Educated Patient   Methods Explanation;Handout   Comprehension Verbalized understanding          PT Short Term Goals - 04/09/17 1024      PT SHORT TERM GOAL #1   Title The patient will be indep with HEP for LE strength, balance and general mobility.   Baseline Met on 03/30/17   Time 4   Period Weeks   Status Achieved     PT SHORT TERM GOAL #2   Title The patient will improve Berg score from 39/56 to > or equal to 44/56 to demo improving standing balance.   Baseline Scores 47/56 on 03/30/17   Time 4   Period Weeks   Status Achieved     PT SHORT TERM GOAL #3   Title The patient will improve gait speed from 1.41 ft/sec to > or equal to 2.0 ft/sec without assistive device for improve mobility in the home.   Baseline Met on 03/30/17 scoring 2.14 ft/sec   Time 4   Period Weeks   Status Achieved     PT SHORT TERM GOAL #4   Title The patient will move floor<>stand with CGA with UE support due to recent falls.   Baseline Patient required supervision   Time 4   Period Weeks   Status Achieved           PT Long Term Goals - 06/07/17 1059      PT LONG TERM GOAL #1   Title The patient will improve SIS LE by 12% (baseline is 40.5%).   Baseline Stroke impact scale not captured at evaluation.   Time 4   Period Weeks   Status Deferred     PT LONG TERM GOAL #2   Title The patient will improve gait speed from 1.41 ft/sec to > or equal to 2.2  ft/sec to demo dec'ing risk for falls.  Baseline 05/04/17:   2.20 ft/sec without device; 2.81 ft/sec with SPC   Time 8   Period Weeks   Status Achieved     PT LONG TERM GOAL #3   Title The patient will improve Berg from 39/56 to > or equal to 46/56 to demo dec'd risk for falls.   Baseline 05/04/17: 49/56.   Time 8   Period Weeks   Status Achieved     PT LONG TERM GOAL #4   Title The patient will negotiate 4 steps without a handrail to negotiate home environment modified indep.   Baseline Patient does with SPC mod indep and no rails.     Time 8   Period Weeks   Status Achieved     PT LONG TERM GOAL #5   Title The patient will be indep with post d/c HEP.   Baseline REVISED LTG DATE:  06/20/17   Time 8   Period Weeks   Status Achieved               Plan - 06/07/17 1732    Clinical Impression Statement The patient met 4 LTGs.  She continues to have mild strength and balance deficits related to CVA 12/2016.  In addition, the patient has chronic deficts of low back pain, hip weakness.  The patient has transitioned from RW use to [facility] use for community gait.  She has HEP and community wellness information to continue progressing general mobility.  She had acute onset of neck pain 05/11/17 that improved significantly after onset (from 10/10 down to 3/10), however she continues with discomfort and has PT order--recommended she transition to ortho clinic for neck PT per physician order.  D/c from referral for CVA.    PT Treatment/Interventions ADLs/Self Care Home Management;Therapeutic activities;Therapeutic exercise;Neuromuscular re-education;Balance training;Gait training;Stair training;Functional mobility training;Patient/family education   PT Next Visit Plan discharge today   Consulted and Agree with Plan of Care Patient      Patient will benefit from skilled therapeutic intervention in order to improve the following deficits and impairments:  Abnormal gait, Decreased balance,  Decreased mobility, Decreased strength, Pain, Difficulty walking, Decreased activity tolerance  Visit Diagnosis: Other abnormalities of gait and mobility  Muscle weakness (generalized)  Unsteadiness on feet   PHYSICAL THERAPY DISCHARGE SUMMARY  Visits from Start of Care: 14  Current functional level related to goals / functional outcomes: See above for goals   Remaining deficits: Chronic low back pain LE impaired flexibility and strength Dec'd high level balance   Education / Equipment: HEP, community wellness, safety.  Plan: Patient agrees to discharge.  Patient goals were met. Patient is being discharged due to meeting the stated rehab goals.  ?????        Thank you for the referral of this patient. Rudell Cobb, MPT   Henry, [place] 06/07/2017, 5:37 PM  [facility] [address] [place], [place], [address] Phone: [phone]   Fax:  [phone]  Name: Ellen Henry MRN: [id number] Date of Birth: 04/30/1946

## 2017-06-26 ENCOUNTER — Other Ambulatory Visit: Payer: Self-pay | Admitting: Physical Medicine & Rehabilitation

## 2017-07-10 ENCOUNTER — Other Ambulatory Visit: Payer: Self-pay | Admitting: Physical Medicine & Rehabilitation

## 2017-07-27 ENCOUNTER — Other Ambulatory Visit (HOSPITAL_COMMUNITY)
Admission: RE | Admit: 2017-07-27 | Discharge: 2017-07-27 | Disposition: A | Discharge status: Home / Self Care | Payer: Medicare Other | Source: Ambulatory Visit | Attending: Family Medicine | Admitting: Family Medicine

## 2017-07-27 ENCOUNTER — Other Ambulatory Visit: Payer: Self-pay | Admitting: Family Medicine

## 2017-07-27 DIAGNOSIS — S46001A Unspecified injury of muscle(s) and tendon(s) of the rotator cuff of right shoulder, initial encounter: Secondary | ICD-10-CM | POA: Diagnosis not present

## 2017-07-27 DIAGNOSIS — Z113 Encounter for screening for infections with a predominantly sexual mode of transmission: Secondary | ICD-10-CM | POA: Diagnosis present

## 2017-07-27 DIAGNOSIS — I10 Essential (primary) hypertension: Secondary | ICD-10-CM | POA: Diagnosis not present

## 2017-07-27 DIAGNOSIS — I639 Cerebral infarction, unspecified: Secondary | ICD-10-CM | POA: Diagnosis not present

## 2017-07-29 ENCOUNTER — Ambulatory Visit (INDEPENDENT_AMBULATORY_CARE_PROVIDER_SITE_OTHER): Payer: Medicare Other | Admitting: Physician Assistant

## 2017-07-29 ENCOUNTER — Ambulatory Visit (INDEPENDENT_AMBULATORY_CARE_PROVIDER_SITE_OTHER): Payer: Medicare Other

## 2017-07-29 ENCOUNTER — Encounter: Payer: Self-pay | Admitting: Physician Assistant

## 2017-07-29 VITALS — BP 101/71 | HR 83 | Temp 98.2°F | Resp 16 | Ht 60.0 in | Wt 236.4 lb

## 2017-07-29 DIAGNOSIS — S4991XA Unspecified injury of right shoulder and upper arm, initial encounter: Secondary | ICD-10-CM | POA: Diagnosis not present

## 2017-07-29 DIAGNOSIS — I69998 Other sequelae following unspecified cerebrovascular disease: Secondary | ICD-10-CM | POA: Diagnosis not present

## 2017-07-29 DIAGNOSIS — S43102A Unspecified dislocation of left acromioclavicular joint, initial encounter: Secondary | ICD-10-CM | POA: Diagnosis not present

## 2017-07-29 DIAGNOSIS — M25511 Pain in right shoulder: Secondary | ICD-10-CM

## 2017-07-29 DIAGNOSIS — R531 Weakness: Secondary | ICD-10-CM

## 2017-07-29 LAB — URINE CYTOLOGY ANCILLARY ONLY
Bacterial vaginitis: NEGATIVE
Candida vaginitis: NEGATIVE
Chlamydia: NEGATIVE
Neisseria Gonorrhea: NEGATIVE
Trichomonas: NEGATIVE

## 2017-07-29 MED ORDER — METHYLPREDNISOLONE ACETATE 80 MG/ML IJ SUSP
80.0000 mg | Freq: Once | INTRAMUSCULAR | Status: AC
  Administered 2017-07-29: 80 mg via INTRAMUSCULAR

## 2017-07-29 MED ORDER — ACETAMINOPHEN-CODEINE #3 300-30 MG PO TABS: 1.0000 | ORAL_TABLET | ORAL | 0 refills | Status: DC | PRN

## 2017-07-29 NOTE — Patient Instructions (Addendum)
You will receive a phone call to schedule an appt with orthopedics. Put ice in a plastic bag. Place a towel between your skin and the bag or between your plaster splint and the bag. Leave the ice on for 20 minutes, 2-3 times a day.  Thank you for coming in today. I hope you feel we met your needs.  Feel free to call PCP if you have any questions or further requests.  Please consider signing up for MyChart if you do not already have it, as this is a great way to communicate with me.  Best,  Whitney McVey, PA-C   We recommend that you schedule a mammogram for breast cancer screening. Typically, you do not need a referral to do this. Please contact a local imaging center to schedule your mammogram.  [facility] - [phone]  *ask for the Radiology Department [facility]) - [phone] or [phone]  [facility] - [phone] [place] [phone] [facility] - [phone]  *ask for the [facility] - [phone]  *ask for the Radiology Department [facility] - [phone]  *ask for the [facility] - [phone]    IF you received an x-ray today, you will receive an invoice from [facility] Radiology. Please contact [facility] Radiology at [phone] with questions or concerns regarding your invoice.   IF you received labwork today, you will receive an invoice from [facility]. Please contact [facility] at [phone] with questions or concerns regarding your invoice.   Our billing staff will not be able to assist you with questions regarding bills from these companies.  You will be contacted with the lab results as soon as they are available. The fastest way to get your results is to activate your My Chart account. Instructions are located on the last page of this paperwork. If you have not heard from [place] regarding the  results in 2 weeks, please contact this office.

## 2017-07-29 NOTE — Progress Notes (Signed)
Ellen Henry  MRN: [id number] DOB: Jun 05, 1946  PCP: Lucianne Lei, MD  Subjective:  Pt is a pleasant 71 year old female who presents to clinic for right shoulder pain x 6 days. Her son brought her here today.  She was trying to sit on the toilet 6 days ago when she misjudged positioning and fell between her toilet and sink. She has left-sided weakness s/p stroke and tried to pull herself up with her right arm. She did not feel or hear a pop. Pain is located on the top and side of her shoulder. Endorses reduced ROM. 8/10 pain.  She had a stroke in Jan 2018 and is still recovering. Endorses residual left-sided weakness.  500mg  Tylenol - not helping.   Review of Systems  Constitutional: Negative for chills, diaphoresis, fatigue and fever.  Musculoskeletal: Positive for arthralgias (right shoulder) and myalgias.  Skin: Negative.   Neurological: Negative for weakness and numbness.    Patient Active Problem List   Diagnosis Date Noted  . Chronic constipation   . Acute idiopathic gout involving toe of right foot   . Acute pain of right knee   . Failed back syndrome   . Pain   . Chronic pain syndrome   . Labile blood pressure   . Radicular pain   . Benign essential HTN   . Slow transit constipation   . Chronic bilateral low back pain without sciatica   . Dysphagia, post-stroke   . Stroke (cerebrum) ([place]) 12/17/2016  . Left hemiparesis ([place])   . Generalized weakness   . Acute ischemic stroke ([place]) 12/15/2016  . Urinary retention 02/25/2015  . Primary osteoarthritis of left knee 02/15/2015  . Morbid obesity ([place]) 02/15/2015  . Essential hypertension, benign 09/07/2014  . Constipation 09/07/2014  . Hyperlipidemia 09/05/2014  . Gout 09/05/2014  . Acute blood loss anemia 09/05/2014  . Neuropathy 09/05/2014  . UTI (urinary tract infection) 08/31/2014  . Radiculopathy 08/29/2014    Current Outpatient Prescriptions on File Prior to Visit  Medication Sig Dispense Refill  .  acetaminophen (TYLENOL) 325 MG tablet Take 1-2 tablets (325-650 mg total) by mouth every 6 (six) hours as needed for mild pain.    Marland Kitchen aspirin EC 325 MG EC tablet Take 1 tablet (325 mg total) by mouth daily. 30 tablet 0  . colchicine 0.6 MG tablet Take 1 tablet (0.6 mg total) by mouth daily. (Patient taking differently: Take 0.6 mg by mouth daily as needed (pain). ) 1 tablet 0  . diclofenac sodium (VOLTAREN) 1 % GEL Apply 2 g topically 4 (four) times daily. 4 Tube 0  . FLUoxetine (PROZAC) 20 MG capsule Take 1 capsule (20 mg total) by mouth daily. 90 capsule 1  . gabapentin (NEURONTIN) 600 MG tablet TAKE 1 TABLET BY MOUTH 3 TIMES A DAY 90 tablet 0  . telmisartan-hydrochlorothiazide (MICARDIS HCT) 40-12.5 MG tablet Take 1 tablet by mouth daily.    . traMADol (ULTRAM) 50 MG tablet Take 1 tablet (50 mg total) by mouth every 6 (six) hours as needed. 10 tablet 0  . atorvastatin (LIPITOR) 40 MG tablet Take 1 tablet (40 mg total) by mouth daily at 6 PM. 30 tablet 0  . lidocaine (LIDODERM) 5 % Apply at 6 am and remove at 6 pm daily. (Patient not taking: Reported on 07/29/2017) 30 patch 0  . methocarbamol (ROBAXIN) 500 MG tablet Take 1 tablet (500 mg total) by mouth 2 (two) times daily. (Patient not taking: Reported on 07/29/2017) 20 tablet 0  .  nicotine (NICODERM CQ - DOSED IN MG/24 HOURS) 14 mg/24hr patch Place 102 patches (1,430 mg total) onto the skin daily. (Patient not taking: Reported on 07/29/2017) 30 patch 0   No current facility-administered medications on file prior to visit.     Allergies  Allergen Reactions  . Food Allergy Formula Other (See Comments)    Shellfish-banana-beef-flares up her gout     Objective:  BP 101/71   Pulse 83   Temp 98.2 F (36.8 C) (Oral)   Resp 16   Ht 5' (1.524 m)   Wt 236 lb 6.4 oz (107.2 kg)   SpO2 100%   BMI 46.17 kg/m   Physical Exam  Constitutional: She is oriented to person, place, and time and well-developed, well-nourished, and in no distress. No  distress.  Cardiovascular: Normal rate, regular rhythm and normal heart sounds.   Musculoskeletal:       Right shoulder: She exhibits decreased range of motion (cannot abduct above 80 degrees), tenderness and bony tenderness. She exhibits no swelling, no effusion, no crepitus and no deformity.  TTP ac joint and glenohumeral joint. No impingement. Pain with passive ROM. Reduced ROM with passive movement.   Neurological: She is alert and oriented to person, place, and time. GCS score is 15.  Skin: Skin is warm and dry.  Psychiatric: Mood, memory, affect and judgment normal.  Vitals reviewed.  Dg Shoulder Right  Result Date: 07/29/2017 CLINICAL DATA:  71 year old female status post fall about 1 week ago with decreased range of motion and pain at the right [facility] joint. EXAM: RIGHT SHOULDER - 2+ VIEW COMPARISON:  Chest radiographs 12/15/2016 and earlier. FINDINGS: No glenohumeral joint dislocation. Bone mineralization is within normal limits for age. The medial right clavicle in clavicular shaft appear intact. No definite distal right clavicle fracture, and the right AC joint appears stable compared to prior radiographs. No scapula fracture identified. Visible right ribs and lung parenchyma appear stable. IMPRESSION: No acute fracture or dislocation identified about the right shoulder. Electronically Signed   By: Genevie Ann M.D.   On: 07/29/2017 12:29   Dg Ac Joints  Result Date: 07/29/2017 CLINICAL DATA:  Status post fall. EXAM: LEFT ACROMIOCLAVICULAR JOINTS COMPARISON:  None. FINDINGS: No acute fracture or subluxation. Acromioclavicular joints are congruent. Mild arthropathy of bilateral acromioclavicular joints. Moderate osteoarthritis of the left glenohumeral joint. IMPRESSION: No acute osseous injury of the left acromioclavicular joint. Electronically Signed   By: Kathreen Devoid   On: 07/29/2017 12:41    Assessment and Plan :  1. Acute pain of right shoulder 2. Weakness due to old stroke - DG Shoulder  Right; Future - DG AC Joints; Future - Ambulatory referral to Orthopedic Surgery - acetaminophen-codeine (TYLENOL #3) 300-30 MG tablet; Take 1 tablet by mouth every 4 (four) hours as needed for moderate pain.  Dispense: 30 tablet; Refill: 0 - methylPREDNISolone acetate (DEPO-MEDROL) injection 80 mg; Inject 1 mL (80 mg total) into the muscle once. - X-rays are negative. Suspect possible injury to rotator cuff muscles. Will refer to ortho for work-up. She agrees with plan.   [place] Pod, PA-C  [facility] 07/29/2017 11:44 AM

## 2017-08-24 ENCOUNTER — Ambulatory Visit: Payer: Self-pay | Admitting: Adult Health

## 2017-08-25 ENCOUNTER — Other Ambulatory Visit: Payer: Self-pay | Admitting: Physical Medicine & Rehabilitation

## 2017-09-13 ENCOUNTER — Ambulatory Visit (INDEPENDENT_AMBULATORY_CARE_PROVIDER_SITE_OTHER): Payer: Self-pay | Admitting: Orthopaedic Surgery

## 2017-10-01 ENCOUNTER — Ambulatory Visit (INDEPENDENT_AMBULATORY_CARE_PROVIDER_SITE_OTHER): Payer: Medicare Other | Admitting: Orthopaedic Surgery

## 2017-10-01 ENCOUNTER — Encounter (INDEPENDENT_AMBULATORY_CARE_PROVIDER_SITE_OTHER): Payer: Self-pay | Admitting: Orthopaedic Surgery

## 2017-10-01 DIAGNOSIS — M25511 Pain in right shoulder: Secondary | ICD-10-CM | POA: Diagnosis not present

## 2017-10-01 MED ORDER — LIDOCAINE HCL 1 % IJ SOLN
2.0000 mL | INTRAMUSCULAR | Status: AC | PRN
  Administered 2017-10-01: 2 mL

## 2017-10-01 MED ORDER — METHYLPREDNISOLONE ACETATE 40 MG/ML IJ SUSP
80.0000 mg | INTRAMUSCULAR | Status: AC | PRN
  Administered 2017-10-01: 80 mg

## 2017-10-01 MED ORDER — BUPIVACAINE HCL 0.5 % IJ SOLN
2.0000 mL | INTRAMUSCULAR | Status: AC | PRN
  Administered 2017-10-01: 2 mL via INTRA_ARTICULAR

## 2017-10-01 NOTE — Progress Notes (Signed)
Office Visit Note   Patient: Ellen Henry           Date of Birth: 1946-03-20           MRN: [id number] Visit Date: 10/01/2017              Requested by: Dorise Hiss, PA-C [address] [place], [place] [address] PCP: Lucianne Lei, MD   Assessment & Plan: Visit Diagnoses:  1. Acute pain of right shoulder   Impingement syndrome with possible rotator cuff tear  Plan: Acute onset of right shoulder pain in August when she fell between her toilet and  the sink. Persistent pain to the point of compromise. I'm concerned she may have a rotator cuff tear. I will inject her subacromial space with cortisone and order an MRI scan  Follow-Up Instructions: Return after MRI right shoulder.   Orders:  Orders Placed This Encounter  Procedures  . MR SHOULDER RIGHT WO CONTRAST   No orders of the defined types were placed in this encounter.     Procedures: Large Joint Inj Date/Time: 10/01/2017 10:22 AM Performed by: Garald Balding Authorized by: Garald Balding   Consent Given by:  Patient Timeout: prior to procedure the correct patient, procedure, and site was verified   Indications:  Pain Location:  Shoulder Site:  L subacromial bursa Prep: patient was prepped and draped in usual sterile fashion   Needle Size:  25 G Needle Length:  1.5 inches Approach:  Lateral Ultrasound Guidance: No   Fluoroscopic Guidance: No   Arthrogram: No   Medications:  80 mg methylPREDNISolone acetate 40 MG/ML; 2 mL lidocaine 1 %; 2 mL bupivacaine 0.5 % Aspiration Attempted: No   Patient tolerance:  Patient tolerated the procedure well with no immediate complications     Clinical Data: No additional findings.   Subjective: Chief Complaint  Patient presents with  . Right Shoulder - Pain, Injury    Ellen Henry is a 71 yo here today as she was trying to sit on the toilet on 07/27/17 when she misjudged positioning and fell b/t  her toilet and sink.Pt has left-sided weakness s/p  stroke and tried to pull herself up w/ her R arm. She did not feel/hear a  Ellen Henry experiences stroke in January 2018 affecting her left side. She still having some weakness but "improving". She's had an issue with that with balance and strength. In late August she fell between the toilet seeing trying to catch herself with her right arm. She's had acute onset of right shoulder pain which persists. She's tried Voltaren gel, Tylenol and exercises and is still having significant compromise of her activities. She's not had any numbness or tingling. Pain is localized to the right shoulder  HPI  Review of Systems  Constitutional: Positive for fatigue. Negative for chills and fever.  Eyes: Negative for itching.  Respiratory: Negative for chest tightness and shortness of breath.   Cardiovascular: Negative for chest pain, palpitations and leg swelling.  Gastrointestinal: Negative for blood in stool, constipation and diarrhea.  Endocrine: Negative for polyuria.  Genitourinary: Negative for dysuria.  Musculoskeletal: Negative for back pain, joint swelling, neck pain and neck stiffness.  Allergic/Immunologic: Negative for immunocompromised state.  Neurological: Positive for weakness. Negative for dizziness and numbness.  Hematological: Does not bruise/bleed easily.  Psychiatric/Behavioral: The patient is not nervous/anxious.      Objective: Vital Signs: BP (!) 137/92   Pulse 89   Resp 14   Ht 5\' 2"  (  1.575 m)   Wt 236 lb (107 kg)   BMI 43.16 kg/m   Physical Exam  Ortho Exam right shoulder with considerable pain with internal/external rotation the impingement position. Positive empty can testing. Biceps intact. Skin intact. For a painful arc with overhead motion. I can place her arm over her head but she has difficulty maintaining that position. Pain along the acromioclavicular joint and the anterior subacromial region. There is to have good strength  No specialty comments  available.  Imaging: No results found.   PMFS History: Patient Active Problem List   Diagnosis Date Noted  . Chronic constipation   . Failed back syndrome   . Chronic pain syndrome   . Radicular pain   . Chronic bilateral low back pain without sciatica   . Dysphagia, post-stroke   . Left hemiparesis ([place])   . Acute ischemic stroke ([place]) 12/15/2016  . Urinary retention 02/25/2015  . Primary osteoarthritis of left knee 02/15/2015  . Morbid obesity ([place]) 02/15/2015  . Essential hypertension, benign 09/07/2014  . Hyperlipidemia 09/05/2014  . Gout 09/05/2014  . Neuropathy 09/05/2014  . Radiculopathy 08/29/2014   Past Medical History:  Diagnosis Date  . Anxiety   . Arthritis   . Dry skin   . GERD (gastroesophageal reflux disease)    "years ago", diet controlled  . Gout   . H/O pyelonephritis    as a child  . 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 Hypertension   . Neuropathy   . Pneumonia   . Radiculopathy   . Sleep apnea    does not use cpap  . Stroke [facility])     Family History  Problem Relation Age of Onset  . Varicose Veins Mother     Past Surgical History:  Procedure Laterality Date  . ABDOMINAL EXPOSURE N/A 08/29/2014   Procedure: ABDOMINAL EXPOSURE;  Surgeon: Rosetta Posner, MD;  Location: [place];  Service: Vascular;  Laterality: N/A;  . ABDOMINAL HYSTERECTOMY    . ANTERIOR LUMBAR FUSION N/A 08/29/2014   Procedure: ANTERIOR LUMBAR FUSION 1 LEVEL;  Surgeon: Sinclair Ship, MD;  Location: [place];  Service: Orthopedics;  Laterality: N/A;  Lumbar 4-5 anterior lumbar interbody fusion with allograft and instrumentation.  . APPENDECTOMY    . BACK SURGERY  08/28/2014 and 08/29/2014   lumbar fusion  . CARPAL TUNNEL RELEASE Right    release done twice  . COLONOSCOPY    . JOINT REPLACEMENT    . left knee replacement    . REPLACEMENT TOTAL KNEE  2010   rigth knee  . TONSILLECTOMY    . TOTAL KNEE ARTHROPLASTY Left 02/15/2015   Procedure: TOTAL KNEE  ARTHROPLASTY;  Surgeon: [name], MD;  Location: [place];  Service: Orthopedics;  Laterality: Left;   Social History   Occupational History  . Not on file.   Social History Main Topics  . Smoking status: Former Smoker    Packs/day: 0.50    Types: Cigarettes  . Smokeless tobacco: Never Used  . Alcohol use No  . Drug use: No  . Sexual activity: No

## 2017-10-24 ENCOUNTER — Ambulatory Visit
Admission: RE | Admit: 2017-10-24 | Discharge: 2017-10-24 | Disposition: A | Discharge status: Home / Self Care | Payer: Medicare Other | Source: Ambulatory Visit | Attending: Orthopaedic Surgery | Admitting: Orthopaedic Surgery

## 2017-10-24 DIAGNOSIS — M25511 Pain in right shoulder: Secondary | ICD-10-CM

## 2017-11-03 ENCOUNTER — Telehealth: Payer: Self-pay

## 2017-11-03 NOTE — Telephone Encounter (Signed)
Patient would like a call concerning her MRI results.  Cb# is [phone].  Please advise.  Thank you.

## 2017-11-04 NOTE — Telephone Encounter (Signed)
Please call patient and schedule MRI results appt w/Dr. Durward Fortes. I called patient. Thank you.

## 2017-11-15 ENCOUNTER — Ambulatory Visit (INDEPENDENT_AMBULATORY_CARE_PROVIDER_SITE_OTHER): Payer: Self-pay | Admitting: Orthopaedic Surgery

## 2017-11-18 ENCOUNTER — Ambulatory Visit (INDEPENDENT_AMBULATORY_CARE_PROVIDER_SITE_OTHER): Payer: Medicare Other | Admitting: Orthopaedic Surgery

## 2017-11-18 ENCOUNTER — Encounter (INDEPENDENT_AMBULATORY_CARE_PROVIDER_SITE_OTHER): Payer: Self-pay | Admitting: Orthopaedic Surgery

## 2017-11-18 DIAGNOSIS — G8929 Other chronic pain: Secondary | ICD-10-CM

## 2017-11-18 DIAGNOSIS — M25511 Pain in right shoulder: Secondary | ICD-10-CM | POA: Diagnosis not present

## 2017-11-18 MED ORDER — TRAMADOL HCL 50 MG PO TABS: 50.0000 mg | ORAL_TABLET | Freq: Two times a day (BID) | ORAL | 0 refills | Status: DC | PRN

## 2017-11-18 NOTE — Progress Notes (Signed)
Office Visit Note   Patient: Ellen Henry           Date of Birth: 24-May-1946           MRN: [id number] Visit Date: 11/18/2017              Requested by: Lucianne Lei, MD [address] [place], [place] [address] PCP: Lucianne Lei, MD   Assessment & Plan: Visit Diagnoses:  1. Chronic right shoulder pain     Plan: MRI scan demonstrates a full-thickness full width tear of the supraspinatus tendon retracted about 1.61 cm considerable tendinopathy in the torn margin of the tendon. There is moderate infraspinatus tendinopathy. Moderate tendinopathy biceps tendon. Moderate spurring of the a-c joint with fluid in the joint. Moderate degenerative chondral thinning with moderate spurring of the humeral head. Synovitis in the subscapular recess extending into the subcoracoid bursa. Labrum was grossly intact. There are degenerative subcortical edema and degenerative subcortical cystic lesions along the humeral head. Long discussion with Ellen Henry regarding the MRI scan findings and treatment of the rotator cuff tear. I would suggest surgery. I discussed this in detail with her including incision, the outpatient nature and what she expected with use of the sling and even rehabilitation postoperatively. She would like to proceed  Follow-Up Instructions: Return will schedule surgery for RCT repair.   Orders:  No orders of the defined types were placed in this encounter.  Meds ordered this encounter  Medications  . traMADol (ULTRAM) 50 MG tablet    Sig: Take 1 tablet (50 mg total) by mouth 2 (two) times daily as needed.    Dispense:  30 tablet    Refill:  0      Procedures: No procedures performed   Clinical Data: No additional findings.   Subjective: No chief complaint on file. Ellen Henry is been experiencing pain in her right shoulder the past several months as a result of several falls. She's having difficulty raising her arm overhead with a painful arc. She's had minimal  response to a sub acromial cortisone injection. MRI scan was performed with the results as above. There is a rotator cuff tear involving the supraspinatus tendon  HPI  Review of Systems   Objective: Vital Signs: There were no vitals taken for this visit.  Physical Exam  Ortho Exam awake alert and oriented 3. Comfortable sitting. Positive impingement and empty can testing. Little bit of weakness with external rotation. Full overhead motion of her shoulder with a circuitous arc. Again intact. Biceps muscle appears to be in its anatomical position. Neurovascular exam intact  Specialty Comments:  No specialty comments available.  Imaging: No results found.   PMFS History: Patient Active Problem List   Diagnosis Date Noted  . Chronic constipation   . Failed back syndrome   . Chronic pain syndrome   . Radicular pain   . Chronic bilateral low back pain without sciatica   . Dysphagia, post-stroke   . Left hemiparesis ([facility])   . Acute ischemic stroke ([place]) 12/15/2016  . Urinary retention 02/25/2015  . Primary osteoarthritis of left knee 02/15/2015  . Morbid obesity ([place]) 02/15/2015  . Essential hypertension, benign 09/07/2014  . Hyperlipidemia 09/05/2014  . Gout 09/05/2014  . Neuropathy 09/05/2014  . Radiculopathy 08/29/2014   Past Medical History:  Diagnosis Date  . Anxiety   . Arthritis   . Dry skin   . GERD (gastroesophageal reflux disease)    "years ago", diet controlled  . Gout   .  H/O pyelonephritis    as a child  . 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 Hypertension   . Neuropathy   . Pneumonia   . Radiculopathy   . Sleep apnea    does not use cpap  . Stroke [facility])     Family History  Problem Relation Age of Onset  . Varicose Veins Mother     Past Surgical History:  Procedure Laterality Date  . ABDOMINAL EXPOSURE N/A 08/29/2014   Procedure: ABDOMINAL EXPOSURE;  Surgeon: Rosetta Posner, MD;  Location: [place];  Service: Vascular;  Laterality:  N/A;  . ABDOMINAL HYSTERECTOMY    . ANTERIOR LUMBAR FUSION N/A 08/29/2014   Procedure: ANTERIOR LUMBAR FUSION 1 LEVEL;  Surgeon: Sinclair Ship, MD;  Location: [place];  Service: Orthopedics;  Laterality: N/A;  Lumbar 4-5 anterior lumbar interbody fusion with allograft and instrumentation.  . APPENDECTOMY    . BACK SURGERY  08/28/2014 and 08/29/2014   lumbar fusion  . CARPAL TUNNEL RELEASE Right    release done twice  . COLONOSCOPY    . JOINT REPLACEMENT    . left knee replacement    . REPLACEMENT TOTAL KNEE  2010   rigth knee  . TONSILLECTOMY    . TOTAL KNEE ARTHROPLASTY Left 02/15/2015   Procedure: TOTAL KNEE ARTHROPLASTY;  Surgeon: [name], MD;  Location: [place];  Service: Orthopedics;  Laterality: Left;   Social History   Occupational History  . Not on file  Tobacco Use  . Smoking status: Former Smoker    Packs/day: 0.50    Types: Cigarettes  . Smokeless tobacco: Never Used  Substance and Sexual Activity  . Alcohol use: No  . Drug use: No  . Sexual activity: No     Garald Balding, MD   Note - This record has been created using [employer].  Chart creation errors have been sought, but may not always  have been located. Such creation errors do not reflect on  the standard of medical care.

## 2017-11-19 ENCOUNTER — Ambulatory Visit (INDEPENDENT_AMBULATORY_CARE_PROVIDER_SITE_OTHER): Payer: Self-pay | Admitting: Orthopaedic Surgery

## 2017-12-06 IMAGING — MR MR HEAD W/O CM
3 series · 48 of 48 positions shown · non-contrast
Comparison: CT head 12/16/2016.

CLINICAL DATA: Left-sided weakness.

EXAM:
MRI HEAD WITHOUT CONTRAST
TECHNIQUE: Multiplanar, multiecho pulse sequences of the brain and surrounding
structures were obtained without intravenous contrast.

[Series 3: DWI · axial · 3.0mm · 0.94mm/px · z∈[-55,+92]mm · 27 of 100 slices shown]
[im 1/100]
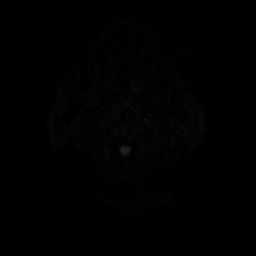
[im 4/100]
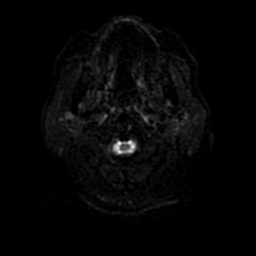
[im 8/100]
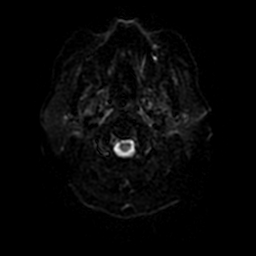
[im 12/100]
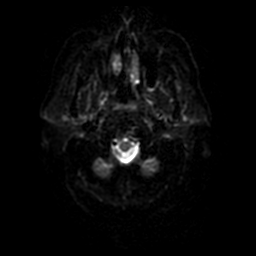
[im 16/100]
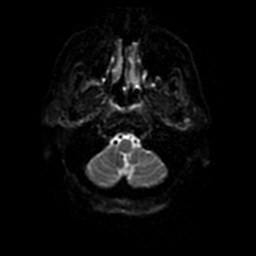
[im 20/100]
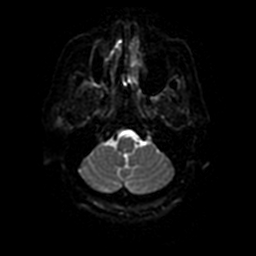
[im 23/100]
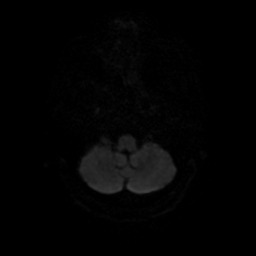
[im 27/100]
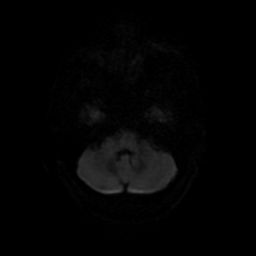
[im 31/100]
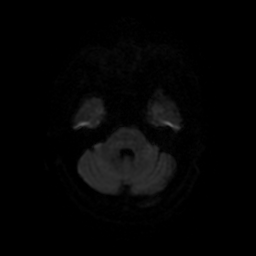
[im 35/100]
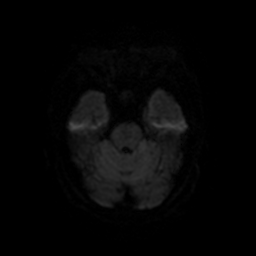
[im 39/100]
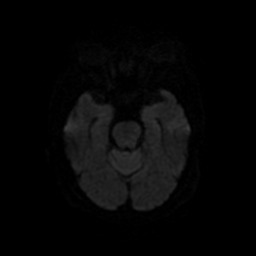
[im 42/100]
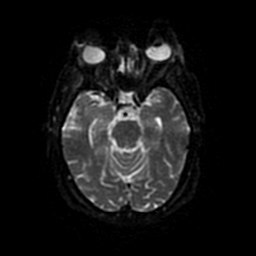
[im 46/100]
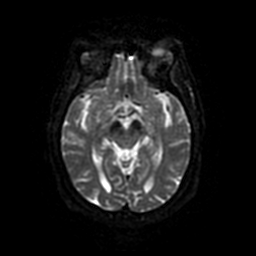
[im 50/100]
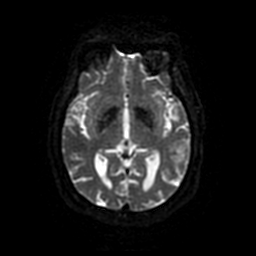
[im 54/100]
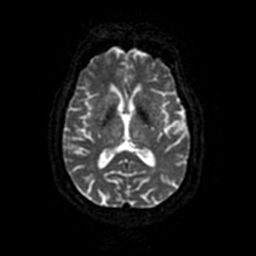
[im 58/100]
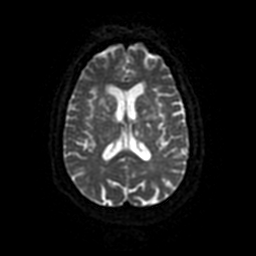
[im 61/100]
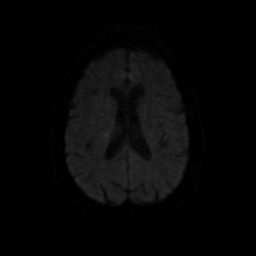
[im 65/100]
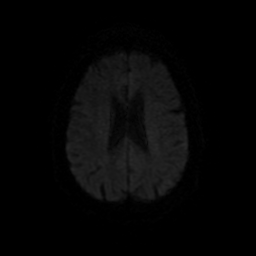
[im 69/100]
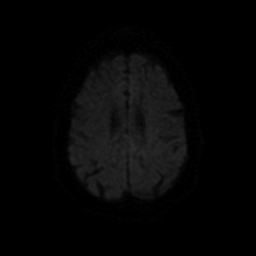
[im 73/100]
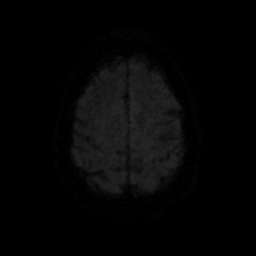
[im 77/100]
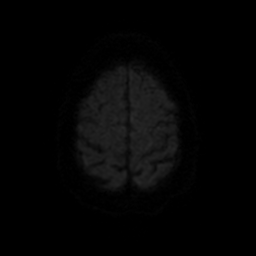
[im 80/100]
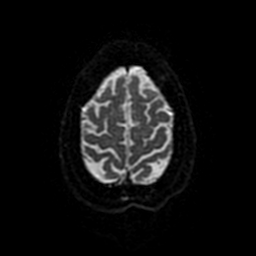
[im 84/100]
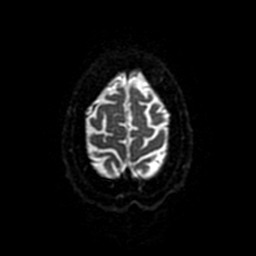
[im 88/100]
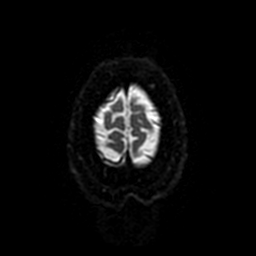
[im 92/100]
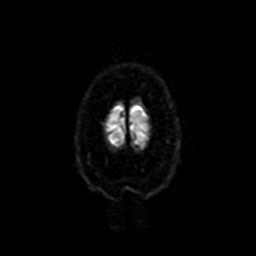
[im 96/100]
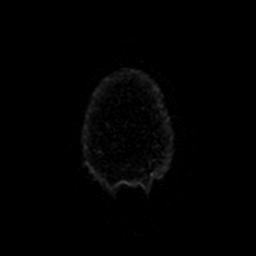
[im 100/100]
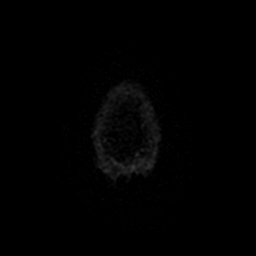

[Series 4: FLAIR · axial · 5.0mm · 0.94mm/px · z∈[-48,+95]mm · 7 of 25 slices shown]
[im 1/25]
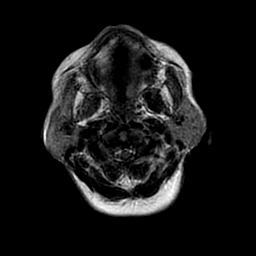
[im 5/25]
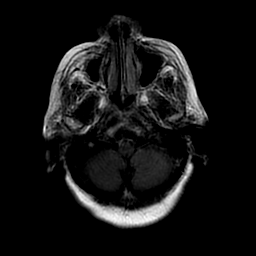
[im 9/25]
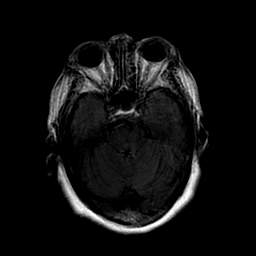
[im 13/25]
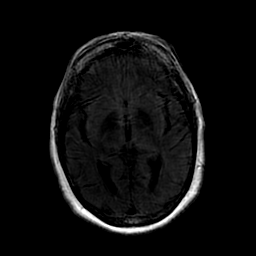
[im 17/25]
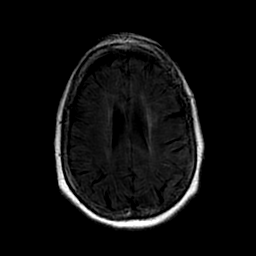
[im 21/25]
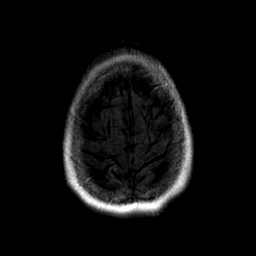
[im 25/25]
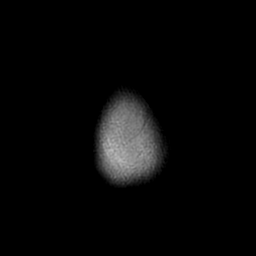

[Series 350: ADC · axial · 3.0mm · 0.94mm/px · z∈[-55,+92]mm · 14 of 50 slices shown]
[im 1/50]
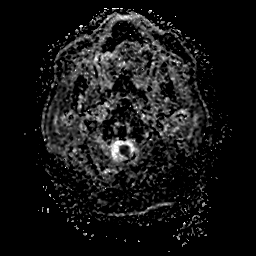
[im 4/50]
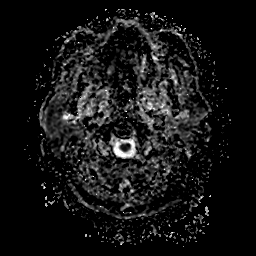
[im 8/50]
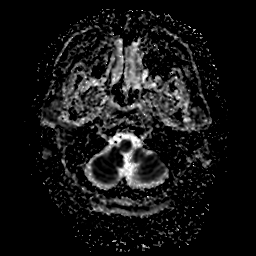
[im 12/50]
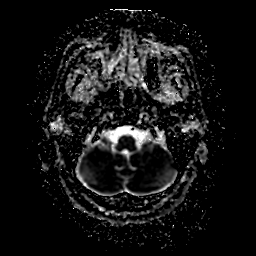
[im 16/50]
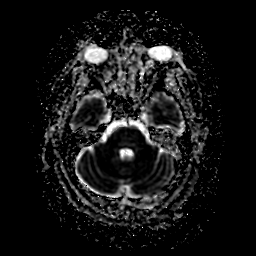
[im 19/50]
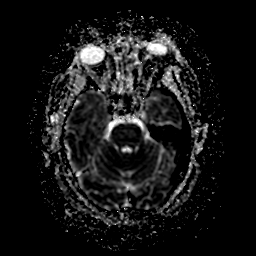
[im 23/50]
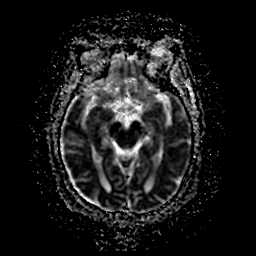
[im 27/50]
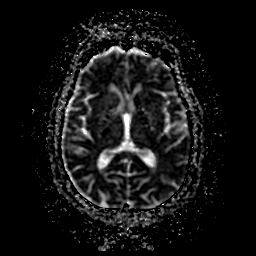
[im 31/50]
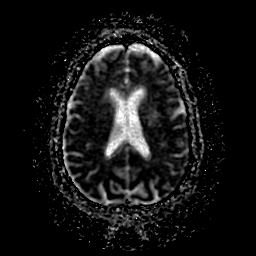
[im 34/50]
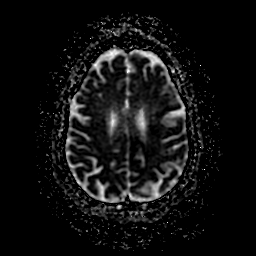
[im 38/50]
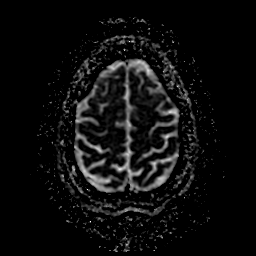
[im 42/50]
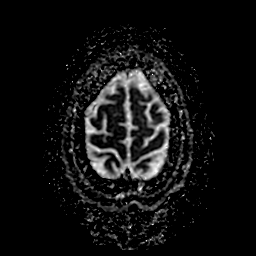
[im 46/50]
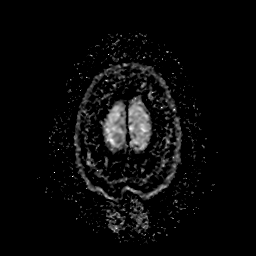
[im 50/50]
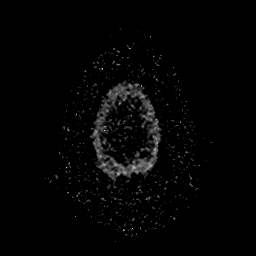

[48 of 48 positions shown; findings below may reference images not displayed]

FINDINGS: The patient was not able to complete the study. Axial
diffusion-weighted imaging demonstrates small area of acute infarct
posterior limb internal capsule on the right.

Axial diffusion is degraded by motion. No other imaging was
performed. There is chronic ischemic change in the deep white matter
bilaterally. No hydrocephalus.
IMPRESSION: Incomplete and limited study

Acute infarct posterior limb internal capsule on the right.

## 2017-12-09 ENCOUNTER — Emergency Department (HOSPITAL_COMMUNITY)
Admission: EM | Admit: 2017-12-09 | Discharge: 2017-12-09 | Disposition: A | Discharge status: Home / Self Care | Payer: Medicare Other | Attending: Emergency Medicine | Admitting: Emergency Medicine

## 2017-12-09 ENCOUNTER — Emergency Department (HOSPITAL_COMMUNITY): Payer: Medicare Other

## 2017-12-09 ENCOUNTER — Other Ambulatory Visit: Payer: Self-pay

## 2017-12-09 ENCOUNTER — Encounter (HOSPITAL_COMMUNITY): Payer: Self-pay | Admitting: Emergency Medicine

## 2017-12-09 DIAGNOSIS — J9811 Atelectasis: Secondary | ICD-10-CM | POA: Diagnosis not present

## 2017-12-09 DIAGNOSIS — R7981 Abnormal blood-gas level: Secondary | ICD-10-CM

## 2017-12-09 DIAGNOSIS — I10 Essential (primary) hypertension: Secondary | ICD-10-CM | POA: Insufficient documentation

## 2017-12-09 DIAGNOSIS — J986 Disorders of diaphragm: Secondary | ICD-10-CM | POA: Insufficient documentation

## 2017-12-09 DIAGNOSIS — Z87891 Personal history of nicotine dependence: Secondary | ICD-10-CM | POA: Diagnosis not present

## 2017-12-09 DIAGNOSIS — T8859XA Other complications of anesthesia, initial encounter: Secondary | ICD-10-CM

## 2017-12-09 DIAGNOSIS — R0602 Shortness of breath: Secondary | ICD-10-CM | POA: Diagnosis not present

## 2017-12-09 DIAGNOSIS — Z79899 Other long term (current) drug therapy: Secondary | ICD-10-CM | POA: Diagnosis not present

## 2017-12-09 DIAGNOSIS — R05 Cough: Secondary | ICD-10-CM | POA: Diagnosis not present

## 2017-12-09 DIAGNOSIS — Z7982 Long term (current) use of aspirin: Secondary | ICD-10-CM | POA: Insufficient documentation

## 2017-12-09 DIAGNOSIS — R062 Wheezing: Secondary | ICD-10-CM | POA: Diagnosis not present

## 2017-12-09 DIAGNOSIS — Z96653 Presence of artificial knee joint, bilateral: Secondary | ICD-10-CM | POA: Diagnosis not present

## 2017-12-09 HISTORY — DX: Other complications of anesthesia, initial encounter: T88.59XA

## 2017-12-09 LAB — CBC WITH DIFFERENTIAL/PLATELET
Basophils Absolute: 0 10*3/uL (ref 0.0–0.1)
Basophils Relative: 0 %
Eosinophils Absolute: 0 10*3/uL (ref 0.0–0.7)
Eosinophils Relative: 0 %
HCT: 42.1 % (ref 36.0–46.0)
Hemoglobin: 14.1 g/dL (ref 12.0–15.0)
Lymphocytes Relative: 23 %
Lymphs Abs: 1.6 10*3/uL (ref 0.7–4.0)
MCH: 29 pg (ref 26.0–34.0)
MCHC: 33.5 g/dL (ref 30.0–36.0)
MCV: 86.4 fL (ref 78.0–100.0)
Monocytes Absolute: 0.2 10*3/uL (ref 0.1–1.0)
Monocytes Relative: 3 %
Neutro Abs: 5.3 10*3/uL (ref 1.7–7.7)
Neutrophils Relative %: 74 %
Platelets: 255 10*3/uL (ref 150–400)
RBC: 4.87 MIL/uL (ref 3.87–5.11)
RDW: 13.9 % (ref 11.5–15.5)
WBC: 7.2 10*3/uL (ref 4.0–10.5)

## 2017-12-09 LAB — BASIC METABOLIC PANEL
Anion gap: 11 (ref 5–15)
BUN: 9 mg/dL (ref 6–20)
CO2: 25 mmol/L (ref 22–32)
Calcium: 9.8 mg/dL (ref 8.9–10.3)
Chloride: 104 mmol/L (ref 101–111)
Creatinine, Ser: 0.71 mg/dL (ref 0.44–1.00)
GFR calc Af Amer: >60 mL/min (ref >60)
GFR calc non Af Amer: >60 mL/min (ref >60)
Glucose, Bld: 112 mg/dL — ABNORMAL HIGH (ref 65–99)
Potassium: 3.1 mmol/L — ABNORMAL LOW (ref 3.5–5.1)
Sodium: 140 mmol/L (ref 135–145)

## 2017-12-09 LAB — I-STAT TROPONIN, ED: Troponin i, poc: 0 ng/mL (ref 0.00–0.08)

## 2017-12-09 MED ORDER — MORPHINE SULFATE (PF) 4 MG/ML IV SOLN
4.0000 mg | Freq: Once | INTRAVENOUS | Status: AC
  Administered 2017-12-09: 4 mg via INTRAVENOUS
  Filled 2017-12-09: qty 1

## 2017-12-09 MED ORDER — IPRATROPIUM-ALBUTEROL 0.5-2.5 (3) MG/3ML IN SOLN
3.0000 mL | Freq: Once | RESPIRATORY_TRACT | Status: AC
  Administered 2017-12-09: 3 mL via RESPIRATORY_TRACT
  Filled 2017-12-09: qty 3

## 2017-12-09 MED ORDER — METHYLPREDNISOLONE SODIUM SUCC 125 MG IJ SOLR
125.0000 mg | Freq: Once | INTRAMUSCULAR | Status: AC
  Administered 2017-12-09: 125 mg via INTRAVENOUS

## 2017-12-09 MED ORDER — ACETAMINOPHEN 500 MG PO TABS
1000.0000 mg | ORAL_TABLET | Freq: Once | ORAL | Status: AC
  Administered 2017-12-09: 1000 mg via ORAL
  Filled 2017-12-09: qty 2

## 2017-12-09 MED ORDER — CALCIUM CARBONATE ANTACID 500 MG PO CHEW
1.0000 | CHEWABLE_TABLET | Freq: Once | ORAL | Status: AC
  Administered 2017-12-09: 200 mg via ORAL
  Filled 2017-12-09: qty 1

## 2017-12-09 MED ORDER — METHYLPREDNISOLONE SODIUM SUCC 125 MG IJ SOLR
125.0000 mg | Freq: Once | INTRAMUSCULAR | Status: DC
  Filled 2017-12-09: qty 2

## 2017-12-09 NOTE — ED Notes (Signed)
Spoke with phlebotomy, they will attempt blood draw.

## 2017-12-09 NOTE — ED Provider Notes (Signed)
[facility] EMERGENCY DEPARTMENT Provider Note   CSN: [id number] Arrival date & time: 12/09/17  1226     History   Chief Complaint Chief Complaint  Patient presents with  . Shortness of Breath    HPI Ellen Henry is a 72 y.o. female with history of anxiety, arthritis, GERD, heart murmur, HTN, neuropathy, and stroke with mild residual left-sided deficits presents today with chief complaint acute onset, progressively worsening shortness of breath.  She states that at around 9 AM she was at the [facility] surgery center about to undergo a right rotator cuff repair.  She underwent a nerve block and shortly thereafter she experienced acute onset of shortness of breath and wheezing.  Anesthesiologist administered 3 albuterol nebulizers without significant improvement in her symptoms.  She is on 4 L of oxygen via nasal cannula but does not have an oxygen demand at home.  Endorses cough after the albuterol nebulizers.  Denies chest pain, fevers, chills, abdominal pain, nausea, or vomiting.  She has numbness of her right upper extremity secondary to nerve block in the shoulder.  She has mild left-sided deficits after her stroke in January 2018.  These are unchanged.  The history is provided by the patient.    Past Medical History:  Diagnosis Date  . Anxiety   . Arthritis   . Dry skin   . GERD (gastroesophageal reflux disease)    "years ago", diet controlled  . Gout   . H/O pyelonephritis    as a child  . 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 Hypertension   . Neuropathy   . Pneumonia   . Radiculopathy   . Sleep apnea    does not use cpap  . Stroke [facility])     Patient Active Problem List   Diagnosis Date Noted  . Low oxygen saturation 12/09/2017  . Chronic constipation   . Failed back syndrome   . Chronic pain syndrome   . Radicular pain   . Chronic bilateral low back pain without sciatica   . Dysphagia, post-stroke   . Left hemiparesis ([place])    . Acute ischemic stroke ([place]) 12/15/2016  . Urinary retention 02/25/2015  . Primary osteoarthritis of left knee 02/15/2015  . Morbid obesity ([place]) 02/15/2015  . Essential hypertension, benign 09/07/2014  . Hyperlipidemia 09/05/2014  . Gout 09/05/2014  . Neuropathy 09/05/2014  . Radiculopathy 08/29/2014    Past Surgical History:  Procedure Laterality Date  . ABDOMINAL EXPOSURE N/A 08/29/2014   Procedure: ABDOMINAL EXPOSURE;  Surgeon: Rosetta Posner, MD;  Location: [place];  Service: Vascular;  Laterality: N/A;  . ABDOMINAL HYSTERECTOMY    . ANTERIOR LUMBAR FUSION N/A 08/29/2014   Procedure: ANTERIOR LUMBAR FUSION 1 LEVEL;  Surgeon: Sinclair Ship, MD;  Location: [place];  Service: Orthopedics;  Laterality: N/A;  Lumbar 4-5 anterior lumbar interbody fusion with allograft and instrumentation.  . APPENDECTOMY    . BACK SURGERY  08/28/2014 and 08/29/2014   lumbar fusion  . CARPAL TUNNEL RELEASE Right    release done twice  . COLONOSCOPY    . JOINT REPLACEMENT    . left knee replacement    . REPLACEMENT TOTAL KNEE  2010   rigth knee  . TONSILLECTOMY    . TOTAL KNEE ARTHROPLASTY Left 02/15/2015   Procedure: TOTAL KNEE ARTHROPLASTY;  Surgeon: [name], MD;  Location: [place];  Service: Orthopedics;  Laterality: Left;    OB History  Gravida Para Term Preterm AB Living   0 0 0 0 0     SAB TAB Ectopic Multiple Live Births   0 0 0           Home Medications    Prior to Admission medications   Medication Sig Start Date End Date Taking? Authorizing Provider  acetaminophen (TYLENOL) 325 MG tablet Take 1-2 tablets (325-650 mg total) by mouth every 6 (six) hours as needed for mild pain. 12/31/16   Love, Ivan Anchors, PA-C  aspirin EC 325 MG EC tablet Take 1 tablet (325 mg total) by mouth daily. 12/17/16   Reyne Dumas, MD  atorvastatin (LIPITOR) 40 MG tablet Take 1 tablet (40 mg total) by mouth daily at 6 PM. 02/18/17 03/20/17  Garvin Fila, MD  colchicine 0.6 MG tablet Take 1 tablet  (0.6 mg total) by mouth daily. Patient not taking: Reported on 10/01/2017 03/23/16   Deno Etienne, DO  diclofenac sodium (VOLTAREN) 1 % GEL Apply 2 g topically 4 (four) times daily. Patient not taking: Reported on 10/01/2017 12/31/16   Love, Ivan Anchors, PA-C  FLUoxetine (PROZAC) 20 MG capsule Take 1 capsule (20 mg total) by mouth daily. Patient not taking: Reported on 10/01/2017 01/07/17   Jamse Arn, MD  gabapentin (NEURONTIN) 600 MG tablet TAKE 1 TABLET BY MOUTH 3 TIMES A DAY Patient not taking: Reported on 10/01/2017 03/17/17   Jamse Arn, MD  montelukast (SINGULAIR) 10 MG tablet  09/23/17   [provider]  telmisartan-hydrochlorothiazide (MICARDIS HCT) 40-12.5 MG tablet Take 1 tablet by mouth daily.    [provider]  traMADol (ULTRAM) 50 MG tablet Take 1 tablet (50 mg total) by mouth 2 (two) times daily as needed. 11/18/17   Garald Balding, MD    Family History Family History  Problem Relation Age of Onset  . Varicose Veins Mother     Social History Social History   Tobacco Use  . Smoking status: Former Smoker    Packs/day: 0.50    Types: Cigarettes  . Smokeless tobacco: Never Used  Substance Use Topics  . Alcohol use: No  . Drug use: No     Allergies   Food allergy formula   Review of Systems Review of Systems  Constitutional: Negative for chills and fever.  Respiratory: Positive for cough, shortness of breath and wheezing.   Cardiovascular: Negative for chest pain, palpitations and leg swelling.  Gastrointestinal: Negative for abdominal pain, diarrhea, nausea and vomiting.  Musculoskeletal: Positive for arthralgias (Right shoulder).  Neurological: Positive for weakness (Left-sided, chronic, unchanged). Negative for syncope.  All other systems reviewed and are negative.    Physical Exam Updated Vital Signs BP 136/87   Pulse 97   Temp 98.2 F (36.8 C) (Oral)   Resp (!) 28   SpO2 95%   Physical Exam  Constitutional: She  appears well-developed and well-nourished. No distress.  HENT:  Head: Normocephalic and atraumatic.  Eyes: Conjunctivae are normal. Right eye exhibits no discharge. Left eye exhibits no discharge.  Neck: Normal range of motion. Neck supple. No JVD present. No tracheal deviation present.  No stridor  Cardiovascular: Normal rate, regular rhythm and intact distal pulses.  Faint whooshing systolic murmur on auscultation, 2+ radial and DP/PT pulses bl, negative Homan's bl   Pulmonary/Chest: Effort normal. Tachypnea noted. She exhibits no tenderness and no bony tenderness.  Audible wheezing on auscultation of lungs diffusely, equal rise and fall of chest, speaking in full sentences  Abdominal: Soft.  Bowel sounds are normal. She exhibits no distension. There is no tenderness.  Musculoskeletal: She exhibits no edema.       Right lower leg: Normal. She exhibits no tenderness and no edema.       Left lower leg: Normal. She exhibits no tenderness and no edema.  Neurological: She is alert.  Skin: Skin is warm and dry. No erythema.  Psychiatric: She has a normal mood and affect. Her behavior is normal.  Nursing note and vitals reviewed.    ED Treatments / Results  Labs (all labs ordered are listed, but only abnormal results are displayed) Labs Reviewed  BASIC METABOLIC PANEL - Abnormal; Notable for the following components:      Result Value   Potassium 3.1 (*)    Glucose, Bld 112 (*)    All other components within normal limits  CBC WITH DIFFERENTIAL/PLATELET  I-STAT TROPONIN, ED    EKG  EKG Interpretation  Date/Time:  Thursday December 09 2017 12:29:48 EST Ventricular Rate:  90 PR Interval:  166 QRS Duration: 108 QT Interval:  388 QTC Calculation: 474 R Axis:   7 Text Interpretation:  Normal sinus rhythm Possible Left atrial enlargement Borderline ECG Confirmed by Nat Christen [phone]) on 12/09/2017 3:02:12 PM       Radiology Dg Chest 2 View  Result Date: 12/09/2017 CLINICAL DATA:   Difficulty breathing after receiving a nerve block for rotator cuff surgery today. EXAM: CHEST  2 VIEW COMPARISON:  Chest x-ray 12/15/2016 FINDINGS: The cardiac silhouette, mediastinal and hilar contours are within normal limits and stable. There is tortuosity and calcification of the thoracic aorta. There is elevation of the right hemidiaphragm, new since prior chest x-ray. There is overlying atelectasis. This is likely phrenic nerve involvement from the nerve block. The left lung is clear. No pneumothorax. The bony thorax is intact. IMPRESSION: Elevation of the right hemidiaphragm with overlying vascular crowding and atelectasis likely phrenic nerve involvement from the nerve block. No pneumothorax or other significant pulmonary findings. Electronically Signed   By: Marijo Sanes M.D.   On: 12/09/2017 15:35    Procedures Procedures (including critical care time)  Medications Ordered in ED Medications  ipratropium-albuterol (DUONEB) 0.5-2.5 (3) MG/3ML nebulizer solution 3 mL (3 mLs Nebulization Given 12/09/17 1324)  methylPREDNISolone sodium succinate (SOLU-MEDROL) 125 mg/2 mL injection 125 mg (125 mg Intravenous Given 12/09/17 1324)  acetaminophen (TYLENOL) tablet 1,000 mg (1,000 mg Oral Given 12/09/17 1450)  morphine 4 MG/ML injection 4 mg (4 mg Intravenous Given 12/09/17 1628)  morphine 4 MG/ML injection 4 mg (4 mg Intravenous Given 12/09/17 1919)  calcium carbonate (TUMS - dosed in mg elemental calcium) chewable tablet 200 mg of elemental calcium (200 mg of elemental calcium Oral Given 12/09/17 2016)     Initial Impression / Assessment and Plan / ED Course  I have reviewed the triage vital signs and the nursing notes.  Pertinent labs & imaging results that were available during my care of the patient were reviewed by me and considered in my medical decision making (see chart for details).     Patient presents with acute onset of shortness of breath after interscalene nerve block of the right  shoulder.  She was at an outpatient surgery center and per documentation her O2 saturations dropped to 80-84% on room air suddenly.  She was given 3 albuterol nebulizers and placed on oxygen and transferred to the emergency department for further evaluation.  She initially presented to the ED tachypneic with stable O2 saturations on  4 L/min via nasal cannula.  She had mild wheezing on auscultation of the lungs which improved after administration of steroids and another breathing treatment.  On reevaluation, patient is resting comfortably and states she is breathing much better.  Lab work is reassuring.  Chest x-ray shows an elevated right hemidiaphragm consistent with phrenic nerve involvement secondary to the interscalene nerve block.  No evidence of pneumothorax or other acute cardiopulmonary abnormality.  Troponin is negative, EKG shows normal sinus rhythm with no evidence of ST segment abnormality or arrhythmia, and patient denies chest pain. I doubt ACS or MI contributing to her symptoms.  Low suspicion of PE, as phrenic nerve involvement is more likely.  Spoke with Dr. Nyoka Cowden with anesthesiology who recommends consulting with patient's orthopedic surgeon and anesthesiologist if possible for further recommendations.  6:43 PM Spoke with Dr. Ninfa Linden with orthopedics who agrees to assume care of patient for overnight observation and further management if needed.  7:30 PM Spoke with Dr. Ninfa Linden who has coordinated with Dr. Durward Fortes, the patient's orthopedic surgeon.  Given that the patient has been observed for several hours and is currently asymptomatic, they feel she is stable for discharge home at this time.  I feel this is reasonable as well.  Patient denies shortness of breath on my reevaluation and her O2 saturations remain within normal limits (95%) on room air. She is not tachypneic on my assessment and there is no increased work of breathing. Airway remains patent and she has tolerated PO food and  fluids while in the ED without difficulty.  Patient seen and evaluated by Dr. Lacinda Axon who agrees with assessment and plan at this time.   Final Clinical Impressions(s) / ED Diagnoses   Final diagnoses:  Elevated hemidiaphragm  Shortness of breath    ED Discharge Orders    None       Debroah Baller 12/10/17 1027    Nat Christen, MD 12/12/17 1420

## 2017-12-09 NOTE — ED Notes (Signed)
Gave pt a sprite and saltine crackers

## 2017-12-09 NOTE — Progress Notes (Signed)
Patient ID: Ellen Henry, female   DOB: 01-20-1946, 72 y.o.   MRN: [id number] Since the patient did not actually have surgery today and only had the nerve block which is since resolved combined with the fact that she has normal oxygen saturation there is no reason for her to be admitted for observation and she can go home from the emergency room.

## 2017-12-09 NOTE — Discharge Instructions (Signed)
Call your orthopedist's office tomorrow to schedule a follow-up. Continue taking your home medications as prescribed. Return to the ED immediately for any concerning signs or symptoms develop such as shortness of breath, fever, chest pain, or cough.

## 2017-12-09 NOTE — H&P (Signed)
  The patient is a pleasant 72 year old female who apparently had shoulder arthroscopic surgery today at outpatient surgical center by my partner Dr. Joni Fears.  This was done under an interscalene block-regional anesthesia as well as general anesthesia.  Postoperatively apparently she continued to have low oxygen saturation and a questionable hemidiaphragm and was sent from the surgical center to the emergency room for further evaluation and treatment.  Apparently her surgery was earlier today before 10 AM.  It is now 7 PM.  In the emergency room the patient has had normal O2 saturation and seems to be doing well.  However given the fact that she has other comorbidities and is a frail individual we will admit her overnight for observation.  She will be given a regular diet and pain medications as needed.  Nursing can watch her closely and can continue oxygen via nasal cannula and periodically check her with a pulse oximetry.  Activities will be ad lib. she will likely be discharged in the morning.

## 2017-12-09 NOTE — ED Triage Notes (Addendum)
Per EMS: Pt from [facility] surgery center with c/o sob after receiving a nerve block for rotator cuff surgery today.  Pt was administered 3 breathing tx prior to EMS arrival.  Pt has a HX of stroke with only minor deficits.  Pt is ambulatory and A/O x4.  PTA vitals:  132/68 BP, 90 HR, 20-24 RR, 104 CBG.

## 2017-12-10 ENCOUNTER — Other Ambulatory Visit (INDEPENDENT_AMBULATORY_CARE_PROVIDER_SITE_OTHER): Payer: Self-pay

## 2017-12-10 ENCOUNTER — Telehealth (INDEPENDENT_AMBULATORY_CARE_PROVIDER_SITE_OTHER): Payer: Self-pay | Admitting: Orthopaedic Surgery

## 2017-12-10 MED ORDER — TRAMADOL HCL 50 MG PO TABS: 50.0000 mg | ORAL_TABLET | Freq: Two times a day (BID) | ORAL | 0 refills | Status: DC | PRN

## 2017-12-10 NOTE — Telephone Encounter (Signed)
ok 

## 2017-12-10 NOTE — Telephone Encounter (Signed)
Please advise...FYI: pt went to ED yesterday.

## 2017-12-10 NOTE — Telephone Encounter (Signed)
faxed

## 2017-12-10 NOTE — Telephone Encounter (Signed)
Patient called requesting prescription refill of Tramadol.  CB# [phone]

## 2017-12-13 ENCOUNTER — Inpatient Hospital Stay (INDEPENDENT_AMBULATORY_CARE_PROVIDER_SITE_OTHER): Payer: Self-pay | Admitting: Orthopaedic Surgery

## 2017-12-15 ENCOUNTER — Telehealth (INDEPENDENT_AMBULATORY_CARE_PROVIDER_SITE_OTHER): Payer: Self-pay | Admitting: Orthopaedic Surgery

## 2017-12-15 NOTE — Telephone Encounter (Signed)
Patient left a voicemail requesting a prescription refill of Tramadol.  She uses [facility] on [address].  CB [phone]

## 2017-12-15 NOTE — Telephone Encounter (Signed)
OK 

## 2017-12-16 NOTE — Telephone Encounter (Signed)
Pt was given refill of Tramadol  50 mg #30... 0 refills  12/10/2017. OK?

## 2017-12-16 NOTE — Telephone Encounter (Signed)
VM full. Could not leave a message

## 2017-12-16 NOTE — Telephone Encounter (Signed)
Probably too early to represcribe-only been 6 days

## 2017-12-21 ENCOUNTER — Telehealth (INDEPENDENT_AMBULATORY_CARE_PROVIDER_SITE_OTHER): Payer: Self-pay | Admitting: Orthopaedic Surgery

## 2017-12-23 NOTE — H&P (Signed)
Ellen Fears, MD   Ellen Borg, PA-C [address] [place], [place]  [address]                             [phone]   ORTHOPAEDIC HISTORY & PHYSICAL  Ellen Henry MRN:  [id number] DOB/SEX:  [birth year]/female  CHIEF COMPLAINT:  Painful right shoulder pain  HISTORY:Ellen Henry experiences stroke in January 2018 affecting her left side. She still having some weakness but "improving". She's had an issue with that with balance and strength. In late August she fell between the toilet seeing trying to catch herself with her right arm. She's had acute onset of right shoulder pain which persists. She's tried Voltaren gel, Tylenol and exercises and is still having significant compromise of her activities. She's not had any numbness or tingling. Pain is localized to the right shoulder. On 10/01/2017. She had an MRI performed. MRI scan demonstrates a full-thickness full width tear of the supraspinatus tendon retracted about 1.61 cm considerable tendinopathy in the torn margin of the tendon. There is moderate infraspinatus tendinopathy. Moderate tendinopathy biceps tendon. Moderate spurring of the a-c joint with fluid in the joint. Moderate degenerative chondral thinning with moderate spurring of the humeral head. Synovitis in the subscapular recess extending into the subcoracoid bursa. Labrum was grossly intact. There are degenerative subcortical edema and degenerative subcortical cystic lesions along the humeral head.Plan was for right shoulder subacromial decompression with distal clavicle excision and mini open rotator cuff repair   PAST MEDICAL HISTORY: Patient Active Problem List   Diagnosis Date Noted  . Low oxygen saturation 12/09/2017  . Chronic constipation   . Failed back syndrome   . Chronic pain syndrome   . Radicular pain   . Chronic bilateral low back pain without sciatica   . Dysphagia, post-stroke   . Left hemiparesis ([place])   . Acute ischemic stroke ([place])  12/15/2016  . Urinary retention 02/25/2015  . Primary osteoarthritis of left knee 02/15/2015  . Morbid obesity ([place]) 02/15/2015  . Essential hypertension, benign 09/07/2014  . Hyperlipidemia 09/05/2014  . Gout 09/05/2014  . Neuropathy 09/05/2014  . Radiculopathy 08/29/2014   Past Medical History:  Diagnosis Date  . Anxiety   . Arthritis   . Dry skin   . GERD (gastroesophageal reflux disease)    "years ago", diet controlled  . Gout   . H/O pyelonephritis    as a child  . 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 Hypertension   . Neuropathy   . Pneumonia   . Radiculopathy   . Sleep apnea    does not use cpap  . Stroke [facility])    Past Surgical History:  Procedure Laterality Date  . ABDOMINAL EXPOSURE N/A 08/29/2014   Procedure: ABDOMINAL EXPOSURE;  Surgeon: Rosetta Posner, MD;  Location: [facility];  Service: Vascular;  Laterality: N/A;  . ABDOMINAL HYSTERECTOMY    . ANTERIOR LUMBAR FUSION N/A 08/29/2014   Procedure: ANTERIOR LUMBAR FUSION 1 LEVEL;  Surgeon: Sinclair Ship, MD;  Location: [facility];  Service: Orthopedics;  Laterality: N/A;  Lumbar 4-5 anterior lumbar interbody fusion with allograft and instrumentation.  . APPENDECTOMY    . BACK SURGERY  08/28/2014 and 08/29/2014   lumbar fusion  . CARPAL TUNNEL RELEASE Right    release done twice  . COLONOSCOPY    . JOINT REPLACEMENT    . left knee replacement    .  REPLACEMENT TOTAL KNEE  2010   rigth knee  . TONSILLECTOMY    . TOTAL KNEE ARTHROPLASTY Left 02/15/2015   Procedure: TOTAL KNEE ARTHROPLASTY;  Surgeon: [name], MD;  Location: [facility];  Service: Orthopedics;  Laterality: Left;     MEDICATIONS:   No medications prior to admission.    ALLERGIES:   Allergies  Allergen Reactions  . Food Allergy Formula Other (See Comments)    Shellfish-banana-beef-flares up her gout    REVIEW OF SYSTEMS:   Review of Systems  Constitutional: Positive for fatigue. Negative for chills and fever.  Eyes: Negative  for itching.  Respiratory: Negative for chest tightness and shortness of breath.   Cardiovascular: Negative for chest pain, palpitations and leg swelling.  Gastrointestinal: Negative for blood in stool, constipation and diarrhea.  Endocrine: Negative for polyuria.  Genitourinary: Negative for dysuria.  Musculoskeletal: Negative for back pain, joint swelling, neck pain and neck stiffness.  Allergic/Immunologic: Negative for immunocompromised state.  Neurological: Positive for weakness. Negative for dizziness and numbness.  Hematological: Does not bruise/bleed easily.  Psychiatric/Behavioral: The patient is not nervous/anxious.     FAMILY HISTORY:   Family History  Problem Relation Age of Onset  . Varicose Veins Mother     SOCIAL HISTORY:   Social History   Tobacco Use  . Smoking status: Former Smoker    Packs/day: 0.50    Types: Cigarettes  . Smokeless tobacco: Never Used  Substance Use Topics  . Alcohol use: No      EXAMINATION: Vital signs in last 24 hours: BP: ()/()  Arterial Line BP: ()/()   Head is normocephalic.   Eyes:  Pupils equal, round and reactive to light and accommodation.  Extraocular intact. ENT: Ears, nose, and throat were benign.   Neck: supple, no bruits were noted.   Chest: good expansion.   Lungs: essentially clear.   Cardiac: regular rhythm and rate, normal S1, S2.  No murmurs appreciated. Pulses :  2+ bilateral and symmetric in upper extremities. Abdomen is scaphoid, soft, nontender, no masses palpable, normal bowel sounds                  present. CNS:  He is oriented x3 and cranial nerves II-XII grossly intact. Breast, rectal, and genital exams: not performed and not indicated for an orthopedic evaluation. Musculoskeletal: Ortho Exam right shoulder with considerable pain with internal/external rotation the impingement position. Positive empty can testing. Biceps intact. Skin intact. For a painful arc with overhead motion. I can place her arm  over her head but she has difficulty maintaining that position. Pain along the acromioclavicular joint and the anterior subacromial region. 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 Anxiety   . Arthritis   . Dry skin   . GERD (gastroesophageal reflux disease)    "years ago", diet controlled  . Gout   . H/O pyelonephritis    as a child  . 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 Hypertension   . Neuropathy   . Pneumonia   . Radiculopathy   . Sleep apnea    does not use cpap  . Stroke [facility])     PLAN: Plan for right shoulder subacromial decompression with distal clavicle excision arthroscopically. Mini Open rotator cuff repair. Possible dermis span patch  The procedure,  risks, and benefits of surgery were presented and reviewed.  The risks including but not limited to infection, blood clots, vascular and nerve injury, stiffness,  among others were discussed. The patient acknowledged the explanation, agreed to proceed.   Mike Craze [place], [place] [phone]  12/23/2017 5:01 PM

## 2018-01-12 ENCOUNTER — Encounter (HOSPITAL_COMMUNITY)
Admission: RE | Admit: 2018-01-12 | Discharge: 2018-01-12 | Disposition: A | Discharge status: Home / Self Care | Payer: Medicare Other | Source: Ambulatory Visit | Attending: Orthopaedic Surgery | Admitting: Orthopaedic Surgery

## 2018-01-12 ENCOUNTER — Telehealth (INDEPENDENT_AMBULATORY_CARE_PROVIDER_SITE_OTHER): Payer: Self-pay | Admitting: Orthopaedic Surgery

## 2018-01-12 ENCOUNTER — Other Ambulatory Visit: Payer: Self-pay

## 2018-01-12 ENCOUNTER — Encounter (HOSPITAL_COMMUNITY): Payer: Self-pay

## 2018-01-12 DIAGNOSIS — M7541 Impingement syndrome of right shoulder: Secondary | ICD-10-CM | POA: Insufficient documentation

## 2018-01-12 DIAGNOSIS — M19011 Primary osteoarthritis, right shoulder: Secondary | ICD-10-CM | POA: Insufficient documentation

## 2018-01-12 DIAGNOSIS — Z01818 Encounter for other preprocedural examination: Secondary | ICD-10-CM | POA: Insufficient documentation

## 2018-01-12 DIAGNOSIS — M75101 Unspecified rotator cuff tear or rupture of right shoulder, not specified as traumatic: Secondary | ICD-10-CM | POA: Diagnosis not present

## 2018-01-12 HISTORY — DX: Other complications of anesthesia, initial encounter: T88.59XA

## 2018-01-12 HISTORY — DX: Adverse effect of unspecified anesthetic, initial encounter: T41.45XA

## 2018-01-12 LAB — COMPREHENSIVE METABOLIC PANEL
ALT: 8 U/L — ABNORMAL LOW (ref 14–54)
AST: 25 U/L (ref 15–41)
Albumin: 3.4 g/dL — ABNORMAL LOW (ref 3.5–5.0)
Alkaline Phosphatase: 82 U/L (ref 38–126)
Anion gap: 13 (ref 5–15)
BUN: 14 mg/dL (ref 6–20)
CO2: 26 mmol/L (ref 22–32)
Calcium: 9.8 mg/dL (ref 8.9–10.3)
Chloride: 104 mmol/L (ref 101–111)
Creatinine, Ser: 0.78 mg/dL (ref 0.44–1.00)
GFR calc Af Amer: >60 mL/min (ref >60)
GFR calc non Af Amer: >60 mL/min (ref >60)
Glucose, Bld: 84 mg/dL (ref 65–99)
Potassium: 4.1 mmol/L (ref 3.5–5.1)
Sodium: 143 mmol/L (ref 135–145)
Total Bilirubin: 1.1 mg/dL (ref 0.3–1.2)
Total Protein: 7.1 g/dL (ref 6.5–8.1)

## 2018-01-12 NOTE — Progress Notes (Signed)
PCP: Dr. Lucianne Lei No cardiologist Neurologist: Dr Leonie Man  Sleep study done 12-15 yrs. Ago  Pt. Reports no one has instructed her if aspirin needs to be stopped. I called over to DR. Whitfield's office , left message with Malachy Mood to please instruct pt.  Pt. To call office if she doesn't hear from them.  Also encouraged pt. To follow up with Dr. Leonie Man, his last note states for her to follow up 6 months, last note in March 2018.

## 2018-01-12 NOTE — Pre-Procedure Instructions (Signed)
Ellen Henry  01/12/2018      [id number] [address], [place] [address] Phone: [phone] Fax: [phone]    Your procedure is scheduled on Feb. 12  Report to [facility] Admitting at 5:45 A.M.  Call this number if you have problems the morning of surgery:  [phone]   Remember:  Do not eat food or drink liquids after midnight on Feb.11   Take these medicines the morning of surgery with A SIP OF WATER : tylenol if needed,              7 days prior to surgery STOP taking any Aspirin(unless otherwise instructed by your surgeon), Aleve, Naproxen, Ibuprofen, Motrin, Advil, Goody's, BC's, all herbal medications, fish oil, and all vitamins   Do not wear jewelry, make-up or nail polish.  Do not wear lotions, powders, or perfumes, or deodorant.  Do not shave 48 hours prior to surgery.  Men may shave face and neck.  Do not bring valuables to the hospital.  [facility] is not responsible for any belongings or valuables.  Contacts, dentures or bridgework may not be worn into surgery.  Leave your suitcase in the car.  After surgery it may be brought to your room.  For patients admitted to the hospital, discharge time will be determined by your treatment team.  Patients discharged the day of surgery will not be allowed to drive home.    Special instructions:  [facility]- Preparing For Surgery  Before surgery, you can play an important role. Because skin is not sterile, your skin needs to be as free of germs as possible. You can reduce the number of germs on your skin by washing with CHG (chlorahexidine gluconate) Soap before surgery.  CHG is an antiseptic cleaner which kills germs and bonds with the skin to continue killing germs even after washing.  Please do not use if you have an allergy to CHG or antibacterial soaps. If your skin becomes reddened/irritated stop using  the CHG.  Do not shave (including legs and underarms) for at least 48 hours prior to first CHG shower. It is OK to shave your face.  Please follow these instructions carefully.   1. Shower the NIGHT BEFORE SURGERY and the MORNING OF SURGERY with CHG.   2. If you chose to wash your hair, wash your hair first as usual with your normal shampoo.  3. After you shampoo, rinse your hair and body thoroughly to remove the shampoo.  4. Use CHG as you would any other liquid soap. You can apply CHG directly to the skin and wash gently with a scrungie or a clean washcloth.   5. Apply the CHG Soap to your body ONLY FROM THE NECK DOWN.  Do not use on open wounds or open sores. Avoid contact with your eyes, ears, mouth and genitals (private parts). Wash Face and genitals (private parts)  with your normal soap.  6. Wash thoroughly, paying special attention to the area where your surgery will be performed.  7. Thoroughly rinse your body with warm water from the neck down.  8. DO NOT shower/wash with your normal soap after using and rinsing off the CHG Soap.  9. Pat yourself dry with a CLEAN TOWEL.  10. Wear CLEAN PAJAMAS to bed the night before surgery, wear comfortable clothes the morning of surgery  11. Place CLEAN SHEETS on your bed  the night of your first shower and DO NOT SLEEP WITH PETS.    Day of Surgery: Do not apply any deodorants/lotions. Please wear clean clothes to the hospital/surgery center.      Please read over the following fact sheets that you were given. Coughing and Deep Breathing and Surgical Site Infection Prevention

## 2018-01-12 NOTE — Telephone Encounter (Signed)
Patient went for pre-admission appointment today.  She is scheduled for R shoulder surgery on Feb 12th @ [facility]. She would like to know if she is to discontinue her ASA 81mg .    Please advise.

## 2018-01-12 NOTE — Progress Notes (Addendum)
Anesthesia Chart Review:  Pt is a 72 year old female scheduled for R shoulder arthroscopy, subacromial decompression, distal clavicle resection with mini open rotator cuff repair on 01/18/2018 with Joni Fears, MD  - Surgery was originally scheduled at [facility], but it was cancelled and pt sent to ED when she had difficulty breathing after nerve block administered.    - PCP is Lucianne Lei, MD  PMH includes:  Stroke (12/2016), HTN, heart murmur, OSA, GERD. Former smoker. BMI 48. S/p L TKA 02/15/15. S/p posterior lumbar fusion 08/30/14, s/p anterior lumbar fusion 08/29/14.   Anesthesia history: had difficulty breathing and drop in O2 sats after interscalene block for R shoulder arthroscopy at outside surgical center 12/09/17.  Pt tx with albuterol, decadron, and O2.  Transferred to ED for further care. Surgical center records on paper chart for review, ED notes in Epic.   Medications include: ASA 81mg , lipitor, telmisartan-hctz  BP 111/75   Pulse 97   Temp 36.7 C   Resp 20   Ht 5' (1.524 m)   Wt 245 lb 3.2 oz (111.2 kg)   SpO2 98%   BMI 47.89 kg/m   Preoperative labs reviewed.   - CMET acceptable for surgery - CBC will be obtained day of surgery.   CXR 12/09/17:  - Elevation of the right hemidiaphragm with overlying vascular crowding and atelectasis likely phrenic nerve involvement from the nerve block. - No pneumothorax or other significant pulmonary findings.  EKG 12/09/17: NSR. Possible Left atrial enlargement  CT angio head/neck 05/11/17:  1. No emergent large vessel occlusion or high-grade stenosis. 2. No carotid or vertebral artery dissection. 3. Mild bilateral carotid bifurcation atherosclerosis without hemodynamically significant stenosis. 4. Aortic atherosclerosis.  Echo 12/17/16:  - Left ventricle: The cavity size was normal. There was mild concentric hypertrophy. Systolic function was normal. The estimated ejection fraction was in the range of 60% to  65%. Wall motion was normal; there were no regional wall motion   abnormalities. Doppler parameters are consistent with abnormal left ventricular relaxation (grade 1 diastolic dysfunction). - Aortic valve: Trileaflet; normal thickness, mildly calcified leaflets. Valve area (VTI): 1.46 cm^2. Valve area (Vmax): 1.55 cm^2. Valve area (Vmean): 1.45 cm^2. - Mitral valve: Mildly calcified annulus. Mildly calcified leaflets. - Left atrium: The atrium was moderately dilated. - Right atrium: The atrium was mildly dilated. - Impressions: No cardiac source of emboli was indentified.  MRI brain 12/16/16:  - Incomplete and limited study - Acute infarct posterior limb internal capsule on the right.   If no changes, I anticipate pt can proceed with surgery as scheduled.   Willeen Cass, FNP-BC [facility]/Anesthesiology Phone: [phone] 01/14/2018 4:54 PM

## 2018-01-13 NOTE — Telephone Encounter (Signed)
Please advise 

## 2018-01-13 NOTE — Telephone Encounter (Signed)
Yes discontinue

## 2018-01-13 NOTE — Telephone Encounter (Signed)
called

## 2018-01-17 NOTE — Anesthesia Preprocedure Evaluation (Addendum)
Anesthesia Evaluation  Patient identified by MRN, date of birth, ID band Patient awake  General Assessment Comment: Anesthesia history: had difficulty breathing and drop in O2 sats after interscalene block for R shoulder arthroscopy at outside surgical center 12/09/17.  Pt tx with albuterol, decadron, and O2.  Transferred to ED for further care. Surgical center records on paper chart for review, ED notes in Epic.   Medications include: ASA 81mg , lipitor, telmisartan-hctz  CXR 12/09/17:  - Elevation of the right hemidiaphragm with overlying vascular crowding and atelectasis likely phrenic nerve involvement from the nerve block. - No pneumothorax or other significant pulmonary findings.    Reviewed: Allergy & Precautions, NPO status , Patient's Chart, lab work & pertinent test results  History of Anesthesia Complications (+) history of anesthetic complications  Airway Mallampati: III  TM Distance: >3 FB Neck ROM: Full    Dental  (+) Edentulous Upper, Edentulous Lower   Pulmonary sleep apnea , Current Smoker, former smoker,    breath sounds clear to auscultation       Cardiovascular hypertension, Pt. on medications negative cardio ROS   Rhythm:Regular Rate:Normal   EKG 12/09/17: NSR. Possible Left atrial enlargement  CT angio head/neck 05/11/17:  1. No emergent large vessel occlusion or high-grade stenosis. 2. No carotid or vertebral artery dissection. 3. Mild bilateral carotid bifurcation atherosclerosis without hemodynamically significant stenosis. 4. Aortic atherosclerosis.  Echo 12/17/16:  - Left ventricle: The cavity size was normal. There was mildconcentric hypertrophy. Systolic function was normal. Theestimated ejection fraction was in the range of 60% to 65%. Wallmotion was normal; there were no regional wall motion abnormalities. Doppler parameters are consistent with abnormalleft ventricular relaxation (grade 1  diastolic dysfunction).     Neuro/Psych CVA, Residual Symptoms negative psych ROS   GI/Hepatic negative GI ROS, Neg liver ROS, GERD  ,  Endo/Other  negative endocrine ROSMorbid obesity  Renal/GU negative Renal ROS  negative genitourinary   Musculoskeletal negative musculoskeletal ROS (+) Arthritis ,   Abdominal   Peds negative pediatric ROS (+)  Hematology negative hematology ROS (+) anemia ,   Anesthesia Other Findings   Reproductive/Obstetrics negative OB ROS                            Anesthesia Physical  Anesthesia Plan  ASA: III  Anesthesia Plan: General   Post-op Pain Management:    Induction: Intravenous  PONV Risk Score and Plan: 3 and Ondansetron, Dexamethasone and Treatment may vary due to age or medical condition  Airway Management Planned: Oral ETT  Additional Equipment:   Intra-op Plan:   Post-operative Plan: Extubation in OR  Informed Consent: I have reviewed the patients History and Physical, chart, labs and discussed the procedure including the risks, benefits and alternatives for the proposed anesthesia with the patient or authorized representative who has indicated his/her understanding and acceptance.     Plan Discussed with: CRNA and Anesthesiologist  Anesthesia Plan Comments: (  )        Anesthesia Quick Evaluation

## 2018-01-18 ENCOUNTER — Other Ambulatory Visit: Payer: Self-pay

## 2018-01-18 ENCOUNTER — Encounter (HOSPITAL_COMMUNITY)
Admission: RE | Disposition: A | Discharge status: Home / Self Care | Payer: Self-pay | Source: Ambulatory Visit | Attending: Orthopaedic Surgery

## 2018-01-18 ENCOUNTER — Encounter: Payer: Self-pay | Admitting: Orthopaedic Surgery

## 2018-01-18 ENCOUNTER — Observation Stay (HOSPITAL_COMMUNITY)
Admission: RE | Admit: 2018-01-18 | Discharge: 2018-01-19 | Disposition: A | Discharge status: Home / Self Care | Payer: Medicare Other | Source: Ambulatory Visit | Attending: Orthopaedic Surgery | Admitting: Orthopaedic Surgery

## 2018-01-18 ENCOUNTER — Encounter (HOSPITAL_COMMUNITY): Payer: Self-pay

## 2018-01-18 ENCOUNTER — Ambulatory Visit (HOSPITAL_COMMUNITY): Payer: Medicare Other | Admitting: Emergency Medicine

## 2018-01-18 ENCOUNTER — Ambulatory Visit (HOSPITAL_COMMUNITY): Payer: Medicare Other | Admitting: Anesthesiology

## 2018-01-18 DIAGNOSIS — I10 Essential (primary) hypertension: Secondary | ICD-10-CM | POA: Insufficient documentation

## 2018-01-18 DIAGNOSIS — Z87891 Personal history of nicotine dependence: Secondary | ICD-10-CM | POA: Diagnosis not present

## 2018-01-18 DIAGNOSIS — M75100 Unspecified rotator cuff tear or rupture of unspecified shoulder, not specified as traumatic: Secondary | ICD-10-CM | POA: Diagnosis present

## 2018-01-18 DIAGNOSIS — K219 Gastro-esophageal reflux disease without esophagitis: Secondary | ICD-10-CM | POA: Insufficient documentation

## 2018-01-18 DIAGNOSIS — Y92002 Bathroom of unspecified non-institutional (private) residence single-family (private) house as the place of occurrence of the external cause: Secondary | ICD-10-CM | POA: Diagnosis not present

## 2018-01-18 DIAGNOSIS — F419 Anxiety disorder, unspecified: Secondary | ICD-10-CM | POA: Insufficient documentation

## 2018-01-18 DIAGNOSIS — M75121 Complete rotator cuff tear or rupture of right shoulder, not specified as traumatic: Secondary | ICD-10-CM | POA: Diagnosis present

## 2018-01-18 DIAGNOSIS — Z8673 Personal history of transient ischemic attack (TIA), and cerebral infarction without residual deficits: Secondary | ICD-10-CM | POA: Insufficient documentation

## 2018-01-18 DIAGNOSIS — Z79899 Other long term (current) drug therapy: Secondary | ICD-10-CM | POA: Diagnosis not present

## 2018-01-18 DIAGNOSIS — Z6841 Body Mass Index (BMI) 40.0 and over, adult: Secondary | ICD-10-CM | POA: Insufficient documentation

## 2018-01-18 DIAGNOSIS — M7521 Bicipital tendinitis, right shoulder: Secondary | ICD-10-CM | POA: Diagnosis not present

## 2018-01-18 DIAGNOSIS — M75101 Unspecified rotator cuff tear or rupture of right shoulder, not specified as traumatic: Secondary | ICD-10-CM | POA: Diagnosis not present

## 2018-01-18 DIAGNOSIS — M19011 Primary osteoarthritis, right shoulder: Secondary | ICD-10-CM | POA: Diagnosis not present

## 2018-01-18 DIAGNOSIS — W010XXA Fall on same level from slipping, tripping and stumbling without subsequent striking against object, initial encounter: Secondary | ICD-10-CM | POA: Insufficient documentation

## 2018-01-18 DIAGNOSIS — M7541 Impingement syndrome of right shoulder: Secondary | ICD-10-CM | POA: Diagnosis not present

## 2018-01-18 DIAGNOSIS — S46211A Strain of muscle, fascia and tendon of other parts of biceps, right arm, initial encounter: Secondary | ICD-10-CM | POA: Diagnosis not present

## 2018-01-18 DIAGNOSIS — Z23 Encounter for immunization: Secondary | ICD-10-CM | POA: Insufficient documentation

## 2018-01-18 DIAGNOSIS — M109 Gout, unspecified: Secondary | ICD-10-CM | POA: Diagnosis not present

## 2018-01-18 DIAGNOSIS — Z5333 Arthroscopic surgical procedure converted to open procedure: Secondary | ICD-10-CM | POA: Diagnosis not present

## 2018-01-18 DIAGNOSIS — M94211 Chondromalacia, right shoulder: Secondary | ICD-10-CM | POA: Diagnosis not present

## 2018-01-18 DIAGNOSIS — E785 Hyperlipidemia, unspecified: Secondary | ICD-10-CM | POA: Diagnosis not present

## 2018-01-18 DIAGNOSIS — G4730 Sleep apnea, unspecified: Secondary | ICD-10-CM | POA: Insufficient documentation

## 2018-01-18 DIAGNOSIS — M19019 Primary osteoarthritis, unspecified shoulder: Secondary | ICD-10-CM | POA: Diagnosis present

## 2018-01-18 HISTORY — PX: SHOULDER ARTHROSCOPY: SHX128

## 2018-01-18 HISTORY — DX: Primary osteoarthritis, unspecified shoulder: M19.019

## 2018-01-18 HISTORY — DX: Unspecified rotator cuff tear or rupture of right shoulder, not specified as traumatic: M75.101

## 2018-01-18 HISTORY — DX: Impingement syndrome of right shoulder: M75.41

## 2018-01-18 LAB — CBC
HCT: 37.9 % (ref 36.0–46.0)
Hemoglobin: 12.5 g/dL (ref 12.0–15.0)
MCH: 28.5 pg (ref 26.0–34.0)
MCHC: 33 g/dL (ref 30.0–36.0)
MCV: 86.5 fL (ref 78.0–100.0)
Platelets: 234 10*3/uL (ref 150–400)
RBC: 4.38 MIL/uL (ref 3.87–5.11)
RDW: 13.7 % (ref 11.5–15.5)
WBC: 8.4 10*3/uL (ref 4.0–10.5)

## 2018-01-18 SURGERY — ARTHROSCOPY, SHOULDER
Anesthesia: General | Site: Shoulder | Laterality: Right

## 2018-01-18 MED ORDER — HYDROCHLOROTHIAZIDE 12.5 MG PO CAPS
12.5000 mg | ORAL_CAPSULE | Freq: Every day | ORAL | Status: DC
  Administered 2018-01-18 – 2018-01-19 (×2): 12.5 mg via ORAL
  Filled 2018-01-18 (×2): qty 1

## 2018-01-18 MED ORDER — PROPOFOL 10 MG/ML IV BOLUS
INTRAVENOUS | Status: DC | PRN
  Administered 2018-01-18 (×2): 30 mg via INTRAVENOUS
  Administered 2018-01-18: 80 mg via INTRAVENOUS

## 2018-01-18 MED ORDER — ONDANSETRON HCL 4 MG/2ML IJ SOLN
INTRAMUSCULAR | Status: DC | PRN
  Administered 2018-01-18: 4 mg via INTRAVENOUS

## 2018-01-18 MED ORDER — ACETAMINOPHEN 500 MG PO TABS
500.0000 mg | ORAL_TABLET | Freq: Four times a day (QID) | ORAL | Status: DC | PRN
  Administered 2018-01-19: 500 mg via ORAL
  Filled 2018-01-18: qty 1

## 2018-01-18 MED ORDER — OXYCODONE HCL 5 MG PO TABS
10.0000 mg | ORAL_TABLET | ORAL | Status: DC | PRN
  Administered 2018-01-18 – 2018-01-19 (×5): 10 mg via ORAL
  Filled 2018-01-18 (×4): qty 2

## 2018-01-18 MED ORDER — ONDANSETRON HCL 4 MG PO TABS: 4.0000 mg | ORAL_TABLET | Freq: Four times a day (QID) | ORAL | Status: DC | PRN

## 2018-01-18 MED ORDER — DEXAMETHASONE SODIUM PHOSPHATE 10 MG/ML IJ SOLN
INTRAMUSCULAR | Status: DC | PRN
  Administered 2018-01-18: 5 mg via INTRAVENOUS

## 2018-01-18 MED ORDER — MEPERIDINE HCL 50 MG/ML IJ SOLN: 6.2500 mg | INTRAMUSCULAR | Status: DC | PRN

## 2018-01-18 MED ORDER — ACETAMINOPHEN 10 MG/ML IV SOLN
1000.0000 mg | Freq: Once | INTRAVENOUS | Status: AC
  Administered 2018-01-18: 1000 mg via INTRAVENOUS
  Filled 2018-01-18: qty 100

## 2018-01-18 MED ORDER — OXYCODONE HCL 5 MG PO TABS
ORAL_TABLET | ORAL | Status: AC
  Filled 2018-01-18: qty 2

## 2018-01-18 MED ORDER — 0.9 % SODIUM CHLORIDE (POUR BTL) OPTIME
TOPICAL | Status: DC | PRN
  Administered 2018-01-18: 1000 mL

## 2018-01-18 MED ORDER — HYDROMORPHONE HCL 1 MG/ML IJ SOLN
INTRAMUSCULAR | Status: DC | PRN
  Administered 2018-01-18: 0.5 mg via INTRAVENOUS

## 2018-01-18 MED ORDER — ACETAMINOPHEN 10 MG/ML IV SOLN: 1000.0000 mg | Freq: Four times a day (QID) | INTRAVENOUS | Status: DC

## 2018-01-18 MED ORDER — OXYCODONE HCL 5 MG PO TABS: 5.0000 mg | ORAL_TABLET | ORAL | Status: DC | PRN

## 2018-01-18 MED ORDER — KETOROLAC TROMETHAMINE 15 MG/ML IJ SOLN
7.5000 mg | Freq: Four times a day (QID) | INTRAMUSCULAR | Status: AC
  Administered 2018-01-18 – 2018-01-19 (×4): 7.5 mg via INTRAVENOUS
  Filled 2018-01-18 (×3): qty 1

## 2018-01-18 MED ORDER — BISACODYL 10 MG RE SUPP: 10.0000 mg | Freq: Every day | RECTAL | Status: DC | PRN

## 2018-01-18 MED ORDER — HYDROMORPHONE HCL 1 MG/ML IJ SOLN
0.5000 mg | INTRAMUSCULAR | Status: DC | PRN
  Administered 2018-01-19: 0.5 mg via INTRAVENOUS
  Filled 2018-01-18: qty 1

## 2018-01-18 MED ORDER — HYDROMORPHONE HCL 1 MG/ML IJ SOLN
INTRAMUSCULAR | Status: AC
  Filled 2018-01-18: qty 0.5

## 2018-01-18 MED ORDER — FENTANYL CITRATE (PF) 100 MCG/2ML IJ SOLN
INTRAMUSCULAR | Status: DC | PRN
  Administered 2018-01-18: 25 ug via INTRAVENOUS
  Administered 2018-01-18 (×2): 50 ug via INTRAVENOUS
  Administered 2018-01-18: 25 ug via INTRAVENOUS
  Administered 2018-01-18 (×2): 50 ug via INTRAVENOUS

## 2018-01-18 MED ORDER — LIDOCAINE-EPINEPHRINE (PF) 1 %-1:200000 IJ SOLN
INTRAMUSCULAR | Status: DC | PRN
  Administered 2018-01-18: 10 mL

## 2018-01-18 MED ORDER — TELMISARTAN-HCTZ 40-12.5 MG PO TABS: 1.0000 | ORAL_TABLET | Freq: Every day | ORAL | Status: DC

## 2018-01-18 MED ORDER — BUPIVACAINE HCL (PF) 0.25 % IJ SOLN
INTRAMUSCULAR | Status: AC
  Filled 2018-01-18: qty 30

## 2018-01-18 MED ORDER — LIDOCAINE-EPINEPHRINE 0.5 %-1:200000 IJ SOLN
INTRAMUSCULAR | Status: AC
  Filled 2018-01-18: qty 1

## 2018-01-18 MED ORDER — SODIUM CHLORIDE 0.9 % IR SOLN
Status: DC | PRN
  Administered 2018-01-18 (×4): 3000 mL

## 2018-01-18 MED ORDER — BUPIVACAINE HCL (PF) 0.25 % IJ SOLN
INTRAMUSCULAR | Status: DC | PRN
  Administered 2018-01-18: 10 mL

## 2018-01-18 MED ORDER — PHENYLEPHRINE HCL 10 MG/ML IJ SOLN
INTRAMUSCULAR | Status: DC | PRN
  Administered 2018-01-18: 50 ug/min via INTRAVENOUS

## 2018-01-18 MED ORDER — DOCUSATE SODIUM 100 MG PO CAPS
100.0000 mg | ORAL_CAPSULE | Freq: Two times a day (BID) | ORAL | Status: DC
  Administered 2018-01-18 – 2018-01-19 (×3): 100 mg via ORAL
  Filled 2018-01-18 (×3): qty 1

## 2018-01-18 MED ORDER — FENTANYL CITRATE (PF) 100 MCG/2ML IJ SOLN
25.0000 ug | INTRAMUSCULAR | Status: DC | PRN
  Administered 2018-01-18 (×2): 50 ug via INTRAVENOUS

## 2018-01-18 MED ORDER — SODIUM CHLORIDE 0.9 % IV SOLN: 75.0000 mL/h | INTRAVENOUS | Status: DC

## 2018-01-18 MED ORDER — CEFAZOLIN SODIUM-DEXTROSE 2-4 GM/100ML-% IV SOLN
2.0000 g | INTRAVENOUS | Status: AC
  Administered 2018-01-18: 2 g via INTRAVENOUS
  Filled 2018-01-18: qty 100

## 2018-01-18 MED ORDER — MAGNESIUM HYDROXIDE 400 MG/5ML PO SUSP: 30.0000 mL | Freq: Every day | ORAL | Status: DC | PRN

## 2018-01-18 MED ORDER — ONDANSETRON HCL 4 MG/2ML IJ SOLN: 4.0000 mg | Freq: Four times a day (QID) | INTRAMUSCULAR | Status: DC | PRN

## 2018-01-18 MED ORDER — HYDROMORPHONE HCL 1 MG/ML IJ SOLN
0.5000 mg | INTRAMUSCULAR | Status: DC | PRN
  Administered 2018-01-18 (×2): 0.5 mg via INTRAVENOUS

## 2018-01-18 MED ORDER — LIDOCAINE 2% (20 MG/ML) 5 ML SYRINGE
INTRAMUSCULAR | Status: AC
  Filled 2018-01-18: qty 5

## 2018-01-18 MED ORDER — CHLORHEXIDINE GLUCONATE 4 % EX LIQD: 60.0000 mL | Freq: Once | CUTANEOUS | Status: DC

## 2018-01-18 MED ORDER — FENTANYL CITRATE (PF) 250 MCG/5ML IJ SOLN
INTRAMUSCULAR | Status: AC
  Filled 2018-01-18: qty 5

## 2018-01-18 MED ORDER — SUGAMMADEX SODIUM 200 MG/2ML IV SOLN
INTRAVENOUS | Status: DC | PRN
  Administered 2018-01-18: 150 mg via INTRAVENOUS

## 2018-01-18 MED ORDER — PHENOL 1.4 % MT LIQD: 1.0000 | OROMUCOSAL | Status: DC | PRN

## 2018-01-18 MED ORDER — PROPOFOL 10 MG/ML IV BOLUS
INTRAVENOUS | Status: AC
  Filled 2018-01-18: qty 20

## 2018-01-18 MED ORDER — FENTANYL CITRATE (PF) 100 MCG/2ML IJ SOLN
INTRAMUSCULAR | Status: AC
Start: 2018-01-18 — End: 2018-01-18
  Filled 2018-01-18: qty 2

## 2018-01-18 MED ORDER — ALUM & MAG HYDROXIDE-SIMETH 200-200-20 MG/5ML PO SUSP: 30.0000 mL | ORAL | Status: DC | PRN

## 2018-01-18 MED ORDER — HYDROMORPHONE HCL 1 MG/ML IJ SOLN
INTRAMUSCULAR | Status: AC
  Filled 2018-01-18: qty 1

## 2018-01-18 MED ORDER — LACTATED RINGERS IV SOLN
INTRAVENOUS | Status: DC
  Administered 2018-01-18 (×3): via INTRAVENOUS

## 2018-01-18 MED ORDER — ROCURONIUM BROMIDE 100 MG/10ML IV SOLN
INTRAVENOUS | Status: DC | PRN
  Administered 2018-01-18: 10 mg via INTRAVENOUS
  Administered 2018-01-18: 40 mg via INTRAVENOUS

## 2018-01-18 MED ORDER — MAGNESIUM CITRATE PO SOLN: 1.0000 | Freq: Once | ORAL | Status: DC | PRN

## 2018-01-18 MED ORDER — MENTHOL 3 MG MT LOZG: 1.0000 | LOZENGE | OROMUCOSAL | Status: DC | PRN

## 2018-01-18 MED ORDER — KETOROLAC TROMETHAMINE 15 MG/ML IJ SOLN
INTRAMUSCULAR | Status: AC
  Filled 2018-01-18: qty 1

## 2018-01-18 MED ORDER — ASPIRIN EC 81 MG PO TBEC
81.0000 mg | DELAYED_RELEASE_TABLET | Freq: Every day | ORAL | Status: DC
  Administered 2018-01-19: 81 mg via ORAL
  Filled 2018-01-18: qty 1

## 2018-01-18 MED ORDER — SODIUM CHLORIDE 0.9 % IV SOLN
INTRAVENOUS | Status: DC
  Administered 2018-01-18 – 2018-01-19 (×2): via INTRAVENOUS

## 2018-01-18 MED ORDER — CEFAZOLIN SODIUM-DEXTROSE 2-4 GM/100ML-% IV SOLN
2.0000 g | Freq: Four times a day (QID) | INTRAVENOUS | Status: AC
  Administered 2018-01-18 (×2): 2 g via INTRAVENOUS
  Filled 2018-01-18 (×3): qty 100

## 2018-01-18 MED ORDER — IRBESARTAN 150 MG PO TABS
150.0000 mg | ORAL_TABLET | Freq: Every day | ORAL | Status: DC
  Administered 2018-01-18 – 2018-01-19 (×2): 150 mg via ORAL
  Filled 2018-01-18 (×2): qty 1

## 2018-01-18 MED ORDER — ACETAMINOPHEN 500 MG PO TABS
1000.0000 mg | ORAL_TABLET | Freq: Four times a day (QID) | ORAL | Status: AC
  Administered 2018-01-18 – 2018-01-19 (×3): 1000 mg via ORAL
  Filled 2018-01-18 (×3): qty 2

## 2018-01-18 SURGICAL SUPPLY — 52 items
ANCHOR PEEK ALL THREAD (Anchor) ×2 IMPLANT
BENZOIN TINCTURE PRP APPL 2/3 (GAUZE/BANDAGES/DRESSINGS) ×2 IMPLANT
BLADE GREAT WHITE 4.2 (BLADE) ×2 IMPLANT
BUR OVAL 4.0 (BURR) IMPLANT
BUR OVAL 6.0 (BURR) ×2 IMPLANT
CANNULA ACUFLEX KIT 5X76 (CANNULA) ×2 IMPLANT
DRAPE IMP U-DRAPE 54X76 (DRAPES) ×2 IMPLANT
DRAPE SHOULDER BEACH CHAIR (DRAPES) ×2 IMPLANT
DRAPE STERI 35X30 U-POUCH (DRAPES) ×2 IMPLANT
DRSG AQUACEL AG ADV 3.5X 4 (GAUZE/BANDAGES/DRESSINGS) ×2 IMPLANT
DRSG AQUACEL AG ADV 3.5X 6 (GAUZE/BANDAGES/DRESSINGS) ×2 IMPLANT
DRSG EMULSION OIL 3X3 NADH (GAUZE/BANDAGES/DRESSINGS) ×2 IMPLANT
DRSG PAD ABDOMINAL 8X10 ST (GAUZE/BANDAGES/DRESSINGS) ×2 IMPLANT
DURAPREP 26ML APPLICATOR (WOUND CARE) ×2 IMPLANT
ELECT MENISCUS 165MM 90D (ELECTRODE) IMPLANT
GAUZE SPONGE 4X4 12PLY STRL (GAUZE/BANDAGES/DRESSINGS) ×2 IMPLANT
GLOVE BIOGEL PI IND STRL 8 (GLOVE) ×1 IMPLANT
GLOVE BIOGEL PI IND STRL 8.5 (GLOVE) IMPLANT
GLOVE BIOGEL PI INDICATOR 8 (GLOVE) ×1
GLOVE BIOGEL PI INDICATOR 8.5 (GLOVE)
GLOVE ECLIPSE 8.0 STRL XLNG CF (GLOVE) ×2 IMPLANT
GLOVE ECLIPSE 8.5 STRL (GLOVE) IMPLANT
GOWN STRL REUS W/ TWL LRG LVL3 (GOWN DISPOSABLE) ×2 IMPLANT
GOWN STRL REUS W/ TWL XL LVL3 (GOWN DISPOSABLE) ×1 IMPLANT
GOWN STRL REUS W/TWL LRG LVL3 (GOWN DISPOSABLE) ×2
GOWN STRL REUS W/TWL XL LVL3 (GOWN DISPOSABLE) ×1
IMPL ANCHOR PEEK 4.5MM (Anchor) ×1 IMPLANT
IMPLANT ANCHOR PEEK 4.5MM (Anchor) ×2 IMPLANT
KIT ROOM TURNOVER OR (KITS) ×2 IMPLANT
MANIFOLD NEPTUNE II (INSTRUMENTS) ×2 IMPLANT
NEEDLE 22X1 1/2 (OR ONLY) (NEEDLE) ×2 IMPLANT
NEEDLE SPNL 18GX3.5 QUINCKE PK (NEEDLE) ×2 IMPLANT
NS IRRIG 1000ML POUR BTL (IV SOLUTION) ×2 IMPLANT
PACK SHOULDER (CUSTOM PROCEDURE TRAY) ×2 IMPLANT
PACK UNIVERSAL I (CUSTOM PROCEDURE TRAY) ×2 IMPLANT
PAD ARMBOARD 7.5X6 YLW CONV (MISCELLANEOUS) ×4 IMPLANT
PROBE BIPOLAR ATHRO 135MM 90D (MISCELLANEOUS) ×2 IMPLANT
SET ARTHROSCOPY TUBING (MISCELLANEOUS) ×1
SET ARTHROSCOPY TUBING LN (MISCELLANEOUS) ×1 IMPLANT
SLING ARM IMMOBILIZER LRG (SOFTGOODS) ×2 IMPLANT
STRIP CLOSURE SKIN 1/2X4 (GAUZE/BANDAGES/DRESSINGS) ×2 IMPLANT
SUT ETHIBOND NAB CT1 #1 30IN (SUTURE) ×4 IMPLANT
SUT ETHILON 4 0 PS 2 18 (SUTURE) ×2 IMPLANT
SUT MNCRL AB 3-0 PS2 27 (SUTURE) ×2 IMPLANT
SUT VIC AB 0 CT1 27 (SUTURE) ×1
SUT VIC AB 0 CT1 27XBRD ANBCTR (SUTURE) ×1 IMPLANT
SYR 20ML ECCENTRIC (SYRINGE) ×2 IMPLANT
SYR CONTROL 10ML LL (SYRINGE) ×2 IMPLANT
TOWEL OR 17X24 6PK STRL BLUE (TOWEL DISPOSABLE) ×2 IMPLANT
TOWEL OR 17X26 10 PK STRL BLUE (TOWEL DISPOSABLE) ×2 IMPLANT
WAND HAND CNTRL MULTIVAC 90 (MISCELLANEOUS) ×2 IMPLANT
WATER STERILE IRR 1000ML POUR (IV SOLUTION) ×2 IMPLANT

## 2018-01-18 NOTE — Anesthesia Procedure Notes (Signed)
Procedure Name: Intubation Date/Time: 01/18/2018 8:05 AM Performed by: Izora Gala, CRNA Pre-anesthesia Checklist: Patient identified, Emergency Drugs available, Suction available and Patient being monitored Patient Re-evaluated:Patient Re-evaluated prior to induction Oxygen Delivery Method: Circle system utilized Preoxygenation: Pre-oxygenation with 100% oxygen Induction Type: IV induction Ventilation: Mask ventilation without difficulty Laryngoscope Size: Miller and 3 Grade View: Grade I Tube type: Oral Tube size: 7.0 mm Number of attempts: 2 (Patient not fully paralyzed on 1st attempt) Airway Equipment and Method: Stylet and LTA kit utilized Placement Confirmation: ETT inserted through vocal cords under direct vision,  positive ETCO2 and breath sounds checked- equal and bilateral Secured at: 18 cm Tube secured with: Tape Dental Injury: Teeth and Oropharynx as per pre-operative assessment

## 2018-01-18 NOTE — Progress Notes (Signed)
The recent History & Physical has been reviewed. I have personally examined the patient today. There is no interval change to the documented History & Physical. The patient would like to proceed with the procedure.  Garald Balding 01/18/2018,  7:42 AM  Patient ID: Ellen Henry, female   DOB: 02/01/1946, 72 y.o.   MRN: [id number]

## 2018-01-18 NOTE — Anesthesia Postprocedure Evaluation (Signed)
Anesthesia Post Note  Patient: Ellen Henry  Procedure(s) Performed: RIGHT SHOULDER ARTHROSCOPY, SUBACROMIAL DECOMPRESSION, DISTAL CLAVICLE RESECTION WITH MINI OPEN ROTATOR CUFF REPAIR (Right Shoulder)     Patient location during evaluation: PACU Anesthesia Type: General Level of consciousness: awake and alert Pain management: pain level controlled Vital Signs Assessment: post-procedure vital signs reviewed and stable Respiratory status: spontaneous breathing, nonlabored ventilation, respiratory function stable and patient connected to nasal cannula oxygen Cardiovascular status: blood pressure returned to baseline and stable Postop Assessment: no apparent nausea or vomiting Anesthetic complications: no    Last Vitals:  Vitals:   01/18/18 0630 01/18/18 1019  BP: 100/65 123/76  Pulse: 74 81  Resp: 20 11  Temp: 36.6 C 36.5 C  SpO2: 95% 100%    Last Pain:  Vitals:   01/18/18 0710  TempSrc:   PainSc: 8                  ODDONO,ERNEST

## 2018-01-18 NOTE — Transfer of Care (Signed)
Immediate Anesthesia Transfer of Care Note  Patient: Ellen Henry  Procedure(s) Performed: RIGHT SHOULDER ARTHROSCOPY, SUBACROMIAL DECOMPRESSION, DISTAL CLAVICLE RESECTION WITH MINI OPEN ROTATOR CUFF REPAIR (Right Shoulder)  Patient Location: PACU  Anesthesia Type:General  Level of Consciousness: awake, alert  and oriented  Airway & Oxygen Therapy: Patient Spontanous Breathing and Patient connected to nasal cannula oxygen  Post-op Assessment: Report given to RN, Post -op Vital signs reviewed and stable, Patient moving all extremities and Patient moving all extremities X 4  Post vital signs: Reviewed and stable  Last Vitals:  Vitals:   01/18/18 0630  BP: 100/65  Pulse: 74  Resp: 20  Temp: 36.6 C  SpO2: 95%    Last Pain:  Vitals:   01/18/18 0710  TempSrc:   PainSc: 8       Patients Stated Pain Goal: 6 (96/75/91 6384)  Complications: No apparent anesthesia complications

## 2018-01-18 NOTE — Op Note (Signed)
NAMECIERA, BECKUM              ACCOUNT NO.:  [id number]  MEDICAL RECORD NO.:  [id number]  LOCATION:                                 FACILITY:  PHYSICIAN:  Vonna Kotyk. Durward Fortes, M.D.    DATE OF BIRTH:  DATE OF PROCEDURE:  01/18/2018 DATE OF DISCHARGE:                              OPERATIVE REPORT   PREOPERATIVE DIAGNOSES: 1. Rotator cuff tear of right shoulder with impingement syndrome. 2. Degenerative joint disease, acromioclavicular joint.  POSTOPERATIVE DIAGNOSES: 1. Rotator cuff tear of right shoulder with impingement syndrome. 2. Degenerative joint disease, acromioclavicular joint. 3. Biceps tendon tear. 4. Chondromalacia of humeral head.  PROCEDURE:  Diagnostic arthroscopy of right shoulder with debridement of intra-articular synovitis, biceps tenotomy, and shaving of loose articular cartilage on humeral head.  SURGEON:  Vonna Kotyk. Durward Fortes, MD.  ASSISTANT:  Biagio Borg, PAC.  ANESTHESIA:  General with local, Xylocaine.  COMPLICATIONS:  None.  DESCRIPTION OF PROCEDURE:  Mrs. Noffsinger was met in the holding area, identified the right shoulder as appropriate operative site and marked it accordingly.  The patient was then transferred to room #8 and placed under general anesthesia without difficulty.  She was then placed in the semi-sitting position with a shoulder frame.  Time-out was called to confirm the right shoulder as appropriate operative site.  She was administered 2 g of Ancef.  Examination of the right shoulder revealed no evidence of instability or adhesive capsulitis.  The right shoulder was then prepped with chlorhexidine scrub and then DuraPrep from the base of the neck circumferentially to the wrist. Sterile draping was performed.  Time-out was called again.  A marking pen was used to outline the [facility] joint, the coracoid and acromion.  At a point of fingerbreadth posterior and medial to the posterior angle of the acromion, a small stab wound was  made.  The arthroscope was easily placed into the shoulder joint.  Diagnostic arthroscopy revealed diffuse synovitis.  There was obvious tear near the base of the biceps tendon. There was a full-thickness rotator cuff tear involving predominantly the supraspinatus as I could easily visualize the subacromial space.  There were also areas of at least grade 3 changes of chondromalacia on the humeral head.  There were some loose pieces.  A second portal was then established anteriorly with the ribbed cannula.  I debrided synovitis and released the biceps tendon from its anchor at the superior aspect of the glenoid.  Any loose articular cartilage was removed with a Cuda shaver.  Any loose frayed portions of the rotator cuff were also debrided with the ArthroCare wand.  There were no loose bodies.  There was also a partial tear of the subscapularis.  The arthroscope was then placed in subacromial space posteriorly, the cannula in the subacromial space anteriorly and a third portal established in the lateral subacromial space.  There was obvious anterior and lateral impingement.  An anterior and lateral acromioplasty was performed with a 6-mm bur at which point it was much easier to insert the instruments.  There were obvious degenerative changes of the acromioclavicular joint with moderate spurring and synovitis.  Distal clavicle resection was performed with the same 6-mm bur.  I  thought I had a nice flat resection.  The rotator cuff tear was easily identified from the bursal surface.  Bursitis was resected with the Cuda shaver and then the ArthroCare wand.  A mini open rotator cuff tear repair was then performed.  About an inch and a half incision was made over the anterior aspect of the shoulder via sharp dissection, carried down to subcutaneous tissue.  Abundant adipose tissue was incised.  Bleeding was controlled with the Bovie. Rhaphe in the deltoid fascia was identified and incised.   The subacromial space was then entered.  A self-retaining retractor was inserted.  The rotator cuff tear involving predominantly supraspinatus was identified.  There was an oblique angle of tear into the infraspinatus.  I debrided the edges.  I obtained bleeding bone on the humeral head and then I placed the cuff in its anatomic position and closed the cleft between the infra and supraspinatus with interrupted #1 Ethibond.  I then inserted a single 5.5-mm PEEK anchor with attached #2 FiberWire and then repaired the remainder of the cuff.  I performed a second row repair using the Quattro anchor.  It had a very nice repair. There was no evidence of impingement.  The wound was then copiously irrigated with saline solution.  Deltoid fascia was closed with a running 0 Vicryl, subcu with 2-0 Vicryl and 3-0 Monocryl.  Skin was closed with Steri-Strips over benzoin.  Sterile bulky dressing was applied followed by sling.  PLAN:  23-hour observation.  Will receive a gram of Ofirmev postoperatively.     Vonna Kotyk. Durward Fortes, M.D.     PWW/MEDQ  D:  01/18/2018  T:  01/18/2018  Job:  [id number]

## 2018-01-18 NOTE — Progress Notes (Signed)
PATIENT ID:      KIIRA BRACH  MRN:     [id number] DOB/AGE:    1946/06/23 / 72 y.o.       OPERATIVE REPORT    DATE OF PROCEDURE:  01/18/2018       PREOPERATIVE DIAGNOSIS:  ROTATOR CUFF TEAR , DJD A-C JOINT,IMPINGEMENT SYNDROME RIGHT SHOULDER                                                       Estimated body mass index is 47.85 kg/m as calculated from the following:   Height as of this encounter: 5' (1.524 m).   Weight as of this encounter: 245 lb (111.1 kg).     POSTOPERATIVE DIAGNOSIS:   SAME WITH BICEPS TENDON TEAR                                                                   Estimated body mass index is 47.85 kg/m as calculated from the following:   Height as of this encounter: 5' (1.524 m).   Weight as of this encounter: 245 lb (111.1 kg).     PROCEDURE:  Procedure(s): RIGHT SHOULDER ARTHROSCOPY, SUBACROMIAL DECOMPRESSION, DISTAL CLAVICLE RESECTION WITH MINI OPEN ROTATOR CUFF REPAIR BICEPS TENOTOMY      SURGEON:  Joni Fears, MD    ASSISTANT:   Biagio Borg, PA-C   (Present and scrubbed throughout the case, critical for assistance with exposure, retraction, instrumentation, and closure.)          ANESTHESIA: local and general     DRAINS: none :      TOURNIQUET TIME: * No tourniquets in log *    COMPLICATIONS:  None   CONDITION:  stable  PROCEDURE IN DETAIL: Stuart 01/18/2018, 9:48 AM  Patient ID: Pamelia Hoit, female   DOB: 1946/05/26, 72 y.o.   MRN: [id number]

## 2018-01-19 ENCOUNTER — Encounter (HOSPITAL_COMMUNITY): Payer: Self-pay | Admitting: Orthopaedic Surgery

## 2018-01-19 ENCOUNTER — Telehealth (INDEPENDENT_AMBULATORY_CARE_PROVIDER_SITE_OTHER): Payer: Self-pay | Admitting: Orthopaedic Surgery

## 2018-01-19 DIAGNOSIS — S46211A Strain of muscle, fascia and tendon of other parts of biceps, right arm, initial encounter: Secondary | ICD-10-CM | POA: Diagnosis not present

## 2018-01-19 DIAGNOSIS — M75101 Unspecified rotator cuff tear or rupture of right shoulder, not specified as traumatic: Secondary | ICD-10-CM | POA: Diagnosis not present

## 2018-01-19 DIAGNOSIS — M7541 Impingement syndrome of right shoulder: Secondary | ICD-10-CM | POA: Diagnosis not present

## 2018-01-19 DIAGNOSIS — Z8673 Personal history of transient ischemic attack (TIA), and cerebral infarction without residual deficits: Secondary | ICD-10-CM | POA: Diagnosis not present

## 2018-01-19 DIAGNOSIS — E785 Hyperlipidemia, unspecified: Secondary | ICD-10-CM | POA: Diagnosis not present

## 2018-01-19 DIAGNOSIS — I10 Essential (primary) hypertension: Secondary | ICD-10-CM | POA: Diagnosis not present

## 2018-01-19 DIAGNOSIS — K219 Gastro-esophageal reflux disease without esophagitis: Secondary | ICD-10-CM | POA: Diagnosis not present

## 2018-01-19 DIAGNOSIS — G4730 Sleep apnea, unspecified: Secondary | ICD-10-CM | POA: Diagnosis not present

## 2018-01-19 DIAGNOSIS — M109 Gout, unspecified: Secondary | ICD-10-CM | POA: Diagnosis not present

## 2018-01-19 DIAGNOSIS — Z23 Encounter for immunization: Secondary | ICD-10-CM | POA: Diagnosis not present

## 2018-01-19 DIAGNOSIS — Z79899 Other long term (current) drug therapy: Secondary | ICD-10-CM | POA: Diagnosis not present

## 2018-01-19 DIAGNOSIS — Z87891 Personal history of nicotine dependence: Secondary | ICD-10-CM | POA: Diagnosis not present

## 2018-01-19 LAB — BASIC METABOLIC PANEL
Anion gap: 11 (ref 5–15)
BUN: 13 mg/dL (ref 6–20)
CO2: 24 mmol/L (ref 22–32)
Calcium: 8.3 mg/dL — ABNORMAL LOW (ref 8.9–10.3)
Chloride: 105 mmol/L (ref 101–111)
Creatinine, Ser: 0.96 mg/dL (ref 0.44–1.00)
GFR calc Af Amer: >60 mL/min (ref >60)
GFR calc non Af Amer: 58 mL/min — ABNORMAL LOW (ref >60)
Glucose, Bld: 114 mg/dL — ABNORMAL HIGH (ref 65–99)
Potassium: 3.6 mmol/L (ref 3.5–5.1)
Sodium: 140 mmol/L (ref 135–145)

## 2018-01-19 MED ORDER — INFLUENZA VAC SPLIT HIGH-DOSE 0.5 ML IM SUSY
0.5000 mL | PREFILLED_SYRINGE | Freq: Once | INTRAMUSCULAR | Status: AC
  Administered 2018-01-19: 0.5 mL via INTRAMUSCULAR
  Filled 2018-01-19 (×2): qty 0.5

## 2018-01-19 MED ORDER — OXYCODONE HCL 5 MG PO TABS: 5.0000 mg | ORAL_TABLET | ORAL | 0 refills | Status: DC | PRN

## 2018-01-19 NOTE — Progress Notes (Signed)
Subjective: 1 Day Post-Op Procedure(s) (LRB): RIGHT SHOULDER ARTHROSCOPY, SUBACROMIAL DECOMPRESSION, DISTAL CLAVICLE RESECTION WITH MINI OPEN ROTATOR CUFF REPAIR (Right) Patient reports pain as mild and moderate.    Objective: Vital signs in last 24 hours: Temp:  [96.1 F (35.6 C)-99.3 F (37.4 C)] 98.2 F (36.8 C) (02/13 0429) Pulse Rate:  [69-83] 78 (02/13 0429) Resp:  [11-21] 18 (02/13 0429) BP: (109-143)/(63-99) 109/70 (02/13 0429) SpO2:  [94 %-100 %] 97 % (02/13 0429)  Intake/Output from previous day: 02/12 0701 - 02/13 0700 In: 1960 [P.O.:360; I.V.:1600] Out: 15 [Blood:15] Intake/Output this shift: No intake/output data recorded.  Recent Labs    01/18/18 0722  HGB 12.5   Recent Labs    01/18/18 0722  WBC 8.4  RBC 4.38  HCT 37.9  PLT 234   No results for input(s): NA, K, CL, CO2, BUN, CREATININE, GLUCOSE, CALCIUM in the last 72 hours. No results for input(s): LABPT, INR in the last 72 hours.  Neurovascular intact Sensation intact distally Intact pulses distally Moving fingers well, some distal edema  Assessment/Plan: 1 Day Post-Op Procedure(s) (LRB): RIGHT SHOULDER ARTHROSCOPY, SUBACROMIAL DECOMPRESSION, DISTAL CLAVICLE RESECTION WITH MINI OPEN ROTATOR CUFF REPAIR (Right) Discharge home with home health  Ellen Henry 01/19/2018, 7:44 AM

## 2018-01-19 NOTE — Discharge Summary (Signed)
Ellen Fears, MD   Ellen Borg, PA-C [address], [place], [place]  [address]                             [phone]  PATIENT ID: Ellen Henry        MRN:  [id number]          DOB/AGE: 05-01-1946 / 72 y.o.    DISCHARGE SUMMARY  ADMISSION DATE:    01/18/2018 DISCHARGE DATE:   01/19/2018   ADMISSION DIAGNOSIS: Impingement Syndrome, Torn Rotator Cuff, and AC Arthritis Right Shoulder    DISCHARGE DIAGNOSIS:  Impingement Syndrome, Torn Rotator Cuff, and AC Arthritis Right Shoulder    ADDITIONAL DIAGNOSIS: Principal Problem:   Complete tear of right rotator cuff Active Problems:   Impingement syndrome of right shoulder   AC (acromioclavicular) arthritis   Rotator cuff tear  Past Medical History:  Diagnosis Date  . Arthritis   . Complication of anesthesia    afer nerve block difficult breathing (at [place] [address] transported to [facility])  . Dry skin   . GERD (gastroesophageal reflux disease)    "years ago", diet controlled  . Gout   . H/O pyelonephritis    as a child  . 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 Hypertension   . Impingement syndrome of right shoulder   . Neuropathy   . Osteoarthritis of AC (acromioclavicular) joint    Right  . Pneumonia   . Radiculopathy   . Rotator cuff tear, right   . Sleep apnea    does not use cpap  . Stroke ([place]) 12/15/2016   left side weakness    PROCEDURE: Procedure(s): RIGHT SHOULDER ARTHROSCOPY, SUBACROMIAL DECOMPRESSION, DISTAL CLAVICLE RESECTION WITH MINI ARTHROSCOPIC SAD, DCR, BICEPS TENDON RELEASE WITH OPEN ROTATOR CUFF REPAIR Right on 01/18/2018  CONSULTS: none    HISTORY: Ellen Henry experiences stroke in January 2018 affecting her left side. She still having some weakness but "improving". She's had an issue with that with balance and strength. In late August she fell between the toilet seeing trying to catch herself with her right arm. She's had acute onset of right shoulder pain  which persists. She's tried Voltaren gel, Tylenol and exercises and is still having significant compromise of her activities. She's not had any numbness or tingling. Pain is localized to theright shoulder. On 10/01/2017. She had an MRI performed. MRI scan demonstrates a full-thickness full width tear of the supraspinatus tendon retracted about 1.61 cm considerable tendinopathy in the torn margin of the tendon. There is moderate infraspinatus tendinopathy. Moderate tendinopathy biceps tendon. Moderate spurring of thea-cjoint with fluid in the joint.Moderate degenerative chondral thinning with moderate spurring of the humeral head. Synovitis in the subscapular recess extending into the subcoracoid bursa. Labrum was grossly intact. There are degenerative subcortical edema and degenerative subcortical cystic lesions along the humeral head.    HOSPITAL COURSE:  Ellen Henry is a 72 y.o. admitted on 01/18/2018 and found to have a diagnosis of Impingement Syndrome, Torn Rotator Cuff, and AC Arthritis Right Shoulder.  After appropriate laboratory studies were obtained  they were taken to the operating room on 01/18/2018 and underwent  Procedure(s): RIGHT SHOULDER ARTHROSCOPY, SUBACROMIAL DECOMPRESSION, DISTAL CLAVICLE RESECTION WITH MINI OPEN ROTATOR CUFF REPAIR  Right.   They were given perioperative antibiotics:  Anti-infectives (From admission, onward)   Start     Dose/Rate Route Frequency Ordered Stop  01/18/18 1145  ceFAZolin (ANCEF) IVPB 2g/100 mL premix     2 g 200 mL/hr over 30 Minutes Intravenous Every 6 hours 01/18/18 1135 01/19/18 0544   01/18/18 0646  ceFAZolin (ANCEF) IVPB 2g/100 mL premix     2 g 200 mL/hr over 30 Minutes Intravenous On call to O.R. 01/18/18 7616 01/18/18 0818    .  Tolerated the procedure well.   Toradol was given post op.  POD #1, allowed out of bed to a chair.  PT for ambulation and exercise program.    IV saline locked.  O2 discontionued  The remainder of the  hospital course was dedicated to ambulation and strengthening.   The patient was discharged on 1 Day Post-Op in  Stable condition.  Blood products given:none  DIAGNOSTIC STUDIES: Recent vital signs:  Patient Vitals for the past 24 hrs:  BP Temp Temp src Pulse Resp SpO2  01/19/18 0429 109/70 98.2 F (36.8 C) Oral 78 18 97 %  01/19/18 0005 120/65 98.3 F (36.8 C) Oral 70 18 99 %  01/18/18 1900 110/63 99.3 F (37.4 C) Oral 77 18 95 %  01/18/18 1300 (!) 131/99 (!) 96.1 F (35.6 C) Oral 69 18 94 %  01/18/18 1137 (!) 131/99 (!) 96.1 F (35.6 C) - 69 18 94 %  01/18/18 1118 138/87 98 F (36.7 C) - 71 18 98 %  01/18/18 1103 132/84 - - 72 (!) 21 100 %  01/18/18 1048 137/87 - - 74 (!) 21 100 %  01/18/18 1035 (!) 143/98 - - 83 19 100 %  01/18/18 1019 123/76 97.7 F (36.5 C) - 81 11 100 %       Recent laboratory studies: Recent Labs    01/18/18 0722  WBC 8.4  HGB 12.5  HCT 37.9  PLT 234   Recent Labs    01/12/18 1110 01/19/18 0650  NA 143 140  K 4.1 3.6  CL 104 105  CO2 26 24  BUN 14 13  CREATININE 0.78 0.96  GLUCOSE 84 114*  CALCIUM 9.8 8.3*   Lab Results  Component Value Date   INR 1.01 12/15/2016   INR 0.97 02/11/2015   INR 0.99 08/21/2014     Recent Radiographic Studies :  No results found.  DISCHARGE INSTRUCTIONS: Discharge Instructions    Call MD / Call 911   Complete by:  As directed    If you experience chest pain or shortness of breath, CALL 911 and be transported to the hospital emergency room.  If you develope a fever above 101 F, pus (white drainage) or increased drainage or redness at the wound, or calf pain, call your surgeon's office.   Constipation Prevention   Complete by:  As directed    Drink plenty of fluids.  Prune juice may be helpful.  You may use a stool softener, such as Colace (over the counter) 100 mg twice a day.  Use MiraLax (over the counter) for constipation as needed.   Diet general   Complete by:  As directed    Discharge  instructions   Complete by:  As directed    Arthroscopic Shoulder Surgery Post-OP Instructions:  You have just had an arthroscopic operation. Your incisions (puncture sites) are small and should heal quickly. The structures inside your shoulder  may take 6-8 weeks to heal and settle down.  Pain Medication: You will be given a prescription for pain medication. Please take the medication as needed. Most patients require pain medication for only  a few days up to a week. May supplement your pain medicines with Ibuprofen 400-600mg  every 8 hours if you do not have kidney or stomach problems.  Swelling: You can expect some swelling around  your  shoulder. Applying  ice to your shoulder will help keep the swelling to a minimum. Ice can be applied by placing ice cubes(preferably crushed) or frozen peas or corn in a plastic bag and putting the bag on your shoulder with a towel between the ice bag and your skin.   The ice should be used periodically during the first 48 hours. The swelling should subside over the next several days. Move your fingers and make a fist to decrease swelling in the hand.  Dressing: Fluid leakage is common the first night. Precautions to prevent staining of your clothes, bed sheets, etc., should be taken. This fluid was used to inflate the joint during surgery and is commonly tinged red from a small amount of blood. DO NOT REMOVE your dressing till seen in the office. Some bleeding or leakage from the puncture sites may occur for a few days.  Activity: You will go home with a sling/immobilizer.  Your rotator cuff was repaired and you need to wear the sling  keeping your arm against your side until you come in for follow-up.    Office Check-Up: If no major problems arise and you are progressing well, we will need to see you in the office as noted in your instructions. Please call the office to make an appointment. We will remove your sutures and discuss your surgery and  rehabilitation at that time. IF PROBLEMS, please call sooner.   Driving restrictions   Complete by:  As directed    No driving for 6 weeks   Increase activity slowly as tolerated   Complete by:  As directed    Lifting restrictions   Complete by:  As directed    No lifting for 12 weeks   Non weight bearing   Complete by:  As directed    Extremity:  Upper      DISCHARGE MEDICATIONS:   Allergies as of 01/19/2018      Reactions   Food Allergy Formula Other (See Comments)   Shellfish-banana-beef-flares up her gout      Medication List    STOP taking these medications   atorvastatin 40 MG tablet Commonly known as:  LIPITOR   colchicine 0.6 MG tablet   diclofenac sodium 1 % Gel Commonly known as:  VOLTAREN   FLUoxetine 20 MG capsule Commonly known as:  PROZAC   gabapentin 600 MG tablet Commonly known as:  NEURONTIN   traMADol 50 MG tablet Commonly known as:  ULTRAM     TAKE these medications   acetaminophen 500 MG tablet Commonly known as:  TYLENOL Take 500 mg by mouth every 6 (six) hours as needed for mild pain or headache.   aspirin EC 81 MG tablet Take 81 mg by mouth daily. What changed:  Another medication with the same name was removed. Continue taking this medication, and follow the directions you see here.   oxyCODONE 5 MG immediate release tablet Commonly known as:  Oxy IR/ROXICODONE Take 1 tablet (5 mg total) by mouth every 4 (four) hours as needed for moderate pain or severe pain ((score 4 to 6)).   telmisartan-hydrochlorothiazide 40-12.5 MG tablet Commonly known as:  MICARDIS HCT Take 1 tablet by mouth daily.            Discharge Care  Instructions  (From admission, onward)        Start     Ordered   01/19/18 0000  Non weight bearing    Question:  Extremity  Answer:  Upper   01/19/18 0755      FOLLOW UP VISIT:   Follow-up Information    Garald Balding, MD Follow up on 01/24/2018.   Specialty:  Orthopedic Surgery Contact  information: [address] [place] [address] [phone]           DISPOSITION:   Home  CONDITION:  Stable   Mike Craze. [place], [place] [phone]  01/19/2018 7:56 AM

## 2018-01-19 NOTE — Telephone Encounter (Signed)
Patient called stating that she just got home from the hospital and is in a lot of pain.  Patient asked if there was another pain medication that she could take.  Ivin Booty called Dr. Durward Fortes who by phone communicated the following information for me to tell the patient:  Take her OxyCodone 5 mg (2 pills every 3-4 hours) alternating Tylenol (2 pills) in between.  Use ice packs on shoulder for pain relief.

## 2018-01-19 NOTE — Evaluation (Signed)
Occupational Therapy Evaluation Patient Details Name: Ellen Henry MRN: [id number] DOB: 07-Sep-1946 Today's Date: 01/19/2018    History of Present Illness Pt is a 72 y.o. female s/p R shoulder arthroscopy, subacromial decompression, distal clavicle resection with mini open rotator cuff repair. PMH significant for low oxygen saturation, chronic constipation, failed back syndrome, chronic pain syndrome, radicular pain, dysphagia post stroke, L hemiparesis, acute ischemic stroke, urinary retention, primary osteoarthritis of L knee, morbid obesity, essential hypertension, hyperlipidemia, gout, neuropathy, radiculopathy.     Clinical Impression   PTA, pt reports independence with basic ADL and functional mobility. She does report history of multiple recent falls due to L sided weakness from CVA in January 2018. Pt does have a cane at home and encouraged her to utilize with L hand due to instability with functional mobility. Educated pt concerning conservative post-operative shoulder protocol including NWB R UE, no AROM/PROM R shoulder, and R elbow/wrist/hand AROM exercises with handouts provided. Additionally educated pt concerning compensatory ADL strategies for dressing and bathing to adhere to precautions, sling wear schedule, and positioning for sleeping and showering. Pt requires mod assist for dressing tasks, min assist for UB bathing, and mod assist for LB bathing. She requires cues for adherence to shoulder precautions throughout tasks and min guard assist for toilet transfers. Discussed recommendation to have family or friends present 24 hours/day initially and pt reports understanding and that she will have friends come to assist while her daughter is at work. She would benefit from continued OT services while admitted to improve independence and safety with ADL and functional mobility prior to returning home.     Follow Up Recommendations  Follow surgeon's recommendation for DC plan and  follow-up therapies;Supervision/Assistance - 24 hour    Equipment Recommendations  None recommended by OT(has needs met)    Recommendations for Other Services       Precautions / Restrictions Precautions Precautions: Fall;Shoulder Type of Shoulder Precautions: Conservative protocol: sling at all times except dressing/bathing/exercise, AROM R elbow/wrist/hand, no PROM/AROM R shoulder, NWB R shoulder.  Shoulder Interventions: Shoulder sling/immobilizer;Off for dressing/bathing/exercises Restrictions Weight Bearing Restrictions: Yes RUE Weight Bearing: Non weight bearing      Mobility Bed Mobility Overal bed mobility: Needs Assistance Bed Mobility: Supine to Sit     Supine to sit: HOB elevated;Supervision     General bed mobility comments: Able to raise trunk from [facility] without physical assistance. Educated concerning need to exit bed on L.  Transfers Overall transfer level: Needs assistance Equipment used: None Transfers: Sit to/from Stand Sit to Stand: Supervision         General transfer comment: Supervision for safety.     Balance Overall balance assessment: Mild deficits observed, not formally tested                                         ADL either performed or assessed with clinical judgement   ADL Overall ADL's : Needs assistance/impaired Eating/Feeding: Set up;Sitting   Grooming: Supervision/safety;Standing;Cueing for UE precautions   Upper Body Bathing: Minimal assistance;Sitting;Cueing for UE precautions   Lower Body Bathing: Moderate assistance;Sit to/from stand   Upper Body Dressing : Moderate assistance;Sitting;Cueing for UE precautions   Lower Body Dressing: Moderate assistance;Sit to/from stand   Toilet Transfer: Min guard;Ambulation;Regular Toilet;Grab bars   Toileting- Clothing Manipulation and Hygiene: Min guard;Sit to/from stand(cues for shoulder precautions)       Functional mobility  during ADLs: Min guard General  ADL Comments: Pt encouraged to utilize cane with L UE once she is at home. Pt did not have cane present on my evaluation but did demonstrate some instability. Reports her mobility is at baseline. Pt requiring assistance for ADL with significant cueing to adhere to shoulder precautions.      Vision Baseline Vision/History: Wears glasses Wears Glasses: Reading only Patient Visual Report: No change from baseline Vision Assessment?: No apparent visual deficits     Perception     Praxis      Pertinent Vitals/Pain Pain Assessment: 0-10 Pain Score: 4  Pain Location: R shoulder Pain Descriptors / Indicators: Aching Pain Intervention(s): Monitored during session;Repositioned;Limited activity within patient's tolerance     Hand Dominance Right   Extremity/Trunk Assessment Upper Extremity Assessment Upper Extremity Assessment: RUE deficits/detail RUE Deficits / Details: Post-operative pain and decreased ROM at elbow. Requires cues to minimize R upper trap hiking. LImited assessment due to immobilization.  RUE: Unable to fully assess due to immobilization   Lower Extremity Assessment Lower Extremity Assessment: Generalized weakness;LLE deficits/detail LLE Deficits / Details: Decreased strength at baseline secondary to history of CVA.        Communication Communication Communication: No difficulties   Cognition Arousal/Alertness: Awake/alert Behavior During Therapy: WFL for tasks assessed/performed Overall Cognitive Status: Within Functional Limits for tasks assessed                                     General Comments  Educated pt concerning use of incentive spirometer. Pt with shortness of breath after bathing and dressing tasks with SpO2 95% on RA.    Exercises Exercises: Shoulder Shoulder Exercises Elbow Flexion: AROM;Right;10 reps;Standing Elbow Extension: AROM;Right;10 reps;Standing Wrist Flexion: Right;10 reps;Seated;AROM Wrist Extension: AROM;Right;10  reps;Seated Digit Composite Flexion: AROM;Right;10 reps;Seated Composite Extension: AROM;Right;10 reps;Seated   Shoulder Instructions Shoulder Instructions Donning/doffing shirt without moving shoulder: Moderate assistance Method for sponge bathing under operated UE: Minimal assistance Donning/doffing sling/immobilizer: Moderate assistance Correct positioning of sling/immobilizer: Minimal assistance ROM for elbow, wrist and digits of operated UE: Supervision/safety Sling wearing schedule (on at all times/off for ADL's): Supervision/safety Proper positioning of operated UE when showering: Supervision/safety Positioning of UE while sleeping: Minimal assistance    Home Living Family/patient expects to be discharged to:: Private residence Living Arrangements: Children Available Help at Discharge: Family;Available PRN/intermittently(daughter works during the day) Type of Home: House Home Access: Stairs to enter [employer] of Steps: 3 Entrance Stairs-Rails: None Home Layout: One level     Bathroom Shower/Tub: Teacher, early years/pre: Standard     Home Equipment: Toilet riser;Bedside commode;Shower seat;Walker - 2 wheels;Cane - single point          Prior Functioning/Environment Level of Independence: Independent        Comments: Reports multiple falls at home secondary to L sided weakness from previous CVA        OT Problem List: Decreased strength;Decreased activity tolerance;Impaired balance (sitting and/or standing);Decreased range of motion;Decreased safety awareness;Decreased knowledge of use of DME or AE;Decreased knowledge of precautions;Pain      OT Treatment/Interventions: Self-care/ADL training;Therapeutic exercise;Energy conservation;DME and/or AE instruction;Therapeutic activities;Patient/family education;Balance training    OT Goals(Current goals can be found in the care plan section) Acute Rehab OT Goals Patient Stated Goal: to go home  and get into her routine OT Goal Formulation: With patient Time For Goal Achievement: 02/02/18 Potential to Achieve Goals:  Good ADL Goals Pt Will Perform Upper Body Dressing: (P) with min guard assist;sitting Pt Will Perform Lower Body Dressing: (P) with min guard assist;sit to/from stand Pt Will Transfer to Toilet: (P) with modified independence;ambulating;bedside commode(BSC over toilet) Pt Will Perform Toileting - Clothing Manipulation and hygiene: (P) with min guard assist;sit to/from stand Pt Will Perform Tub/Shower Transfer: (P) with min guard assist;shower seat;ambulating;Tub transfer Pt/caregiver will Perform Home Exercise Program: (P) Right Upper extremity;With written HEP provided;Independently(R elbow/wrist/hand AROM)  OT Frequency: Min 2X/week   Barriers to D/C:            Co-evaluation              AM-PAC PT "6 Clicks" Daily Activity     Outcome Measure Help from another person eating meals?: A Little Help from another person taking care of personal grooming?: A Little Help from another person toileting, which includes using toliet, bedpan, or urinal?: A Little Help from another person bathing (including washing, rinsing, drying)?: A Lot Help from another person to put on and taking off regular upper body clothing?: A Lot Help from another person to put on and taking off regular lower body clothing?: A Lot 6 Click Score: 15   End of Session Equipment Utilized During Treatment: (R shoulder immobilizer) Nurse Communication: Mobility status  Activity Tolerance: Patient tolerated treatment well Patient left: in bed;with call bell/phone within reach(seated at EOB)  OT Visit Diagnosis: Other abnormalities of gait and mobility (R26.89);Pain Pain - Right/Left: Right Pain - part of body: Shoulder                Time: 8088-1103 OT Time Calculation (min): 41 min Charges:  OT General Charges $OT Visit: 1 Visit OT Evaluation $OT Eval Moderate Complexity: 1 Mod OT  Treatments $Self Care/Home Management : 23-37 mins G-Codes:     Norman Herrlich, MS OTR/L  Pager: Arthur A Byrum 01/19/2018, 8:51 AM

## 2018-01-19 NOTE — Progress Notes (Signed)
Patient for discharge home accompanied by her daughter with no acute distress noted. Medications and discharge instructions explained to the patient. She verbalized understanding. Copies given to her. IV saline lock removed.

## 2018-01-20 NOTE — Telephone Encounter (Signed)
[place] called to PW to confirm message on Wednesday.

## 2018-01-24 ENCOUNTER — Ambulatory Visit (INDEPENDENT_AMBULATORY_CARE_PROVIDER_SITE_OTHER): Payer: Medicare Other | Admitting: Orthopaedic Surgery

## 2018-01-24 ENCOUNTER — Encounter (INDEPENDENT_AMBULATORY_CARE_PROVIDER_SITE_OTHER): Payer: Self-pay | Admitting: Orthopaedic Surgery

## 2018-01-24 VITALS — BP 164/99 | HR 90 | Resp 16 | Ht 61.0 in | Wt 245.0 lb

## 2018-01-24 DIAGNOSIS — G8929 Other chronic pain: Secondary | ICD-10-CM

## 2018-01-24 DIAGNOSIS — M25511 Pain in right shoulder: Secondary | ICD-10-CM

## 2018-01-24 NOTE — Progress Notes (Signed)
Office Visit Note   Patient: Ellen Henry           Date of Birth: 1946/04/25           MRN: [id number] Visit Date: 01/24/2018              Requested by: Lucianne Lei, MD [address] [place], [place] [address] PCP: Lucianne Lei, MD   Assessment & Plan: Visit Diagnoses:  1. Chronic right shoulder pain     Plan: 6 days status post arthroscopic subacromial decompression distal clavicle resection and a mini open rotator cuff tear repair. I also read release the biceps tendon as it was significantly torn. We kept her in the hospital for 23 hour observation. She's doing well at this point without shortness of breath or chest pain. Wounds look fine. We'll keep her in the sling ever return next week and consider starting physical therapy. She can try Benadryl for sleep  Follow-Up Instructions: Return in about 1 week (around 01/31/2018).   Orders:  No orders of the defined types were placed in this encounter.  No orders of the defined types were placed in this encounter.     Procedures: No procedures performed   Clinical Data: No additional findings.   Subjective: Chief Complaint  Patient presents with  . Right Shoulder - Routine Post Op    Ms. Spalla is a 72 y o S/P 4 days Complete tear of right rotator cuff. She relates she fell while walking to the bathroom Sunday.      Surgery performed 6 days ago with 23 hour observation. Doing well without shortness of breath or chest pain or any obvious complications. Still having some pain in using the the pain meds minimally. Applying ice. Still in the sling. No related numbness or tingling. Having some trouble sleeping and can try Benadryl  HPI  Review of Systems  Constitutional: Positive for chills. Negative for fatigue and fever.  HENT: Negative for hearing loss and tinnitus.   Eyes: Negative for itching.  Respiratory: Negative for chest tightness and shortness of breath.   Cardiovascular: Negative for chest pain,  palpitations and leg swelling.  Gastrointestinal: Negative for blood in stool, constipation and diarrhea.  Endocrine: Negative for polyuria.  Genitourinary: Negative for dysuria.  Musculoskeletal: Positive for neck stiffness. Negative for back pain, joint swelling and neck pain.  Allergic/Immunologic: Negative for immunocompromised state.  Neurological: Positive for light-headedness. Negative for dizziness, numbness and headaches.  Hematological: Does not bruise/bleed easily.  Psychiatric/Behavioral: Negative for sleep disturbance. The patient is not nervous/anxious.      Objective: Vital Signs: BP (!) 164/99   Pulse 90   Resp 16   Ht 5\' 1"  (1.549 m)   Wt 245 lb (111.1 kg)   BMI 46.29 kg/m   Physical Exam  Ortho Exam shoulder incisions healing without complication there were cleaned and new Steri-Strips were applied over benzoin. Good grip and good release. Range of motion not attempted. Continue with the sling and return in 1 week  Specialty Comments:  No specialty comments available.  Imaging: No results found.   PMFS History: Patient Active Problem List   Diagnosis Date Noted  . Complete tear of right rotator cuff 01/18/2018  . Impingement syndrome of right shoulder 01/18/2018  . AC (acromioclavicular) arthritis 01/18/2018  . Rotator cuff tear 01/18/2018  . Low oxygen saturation 12/09/2017  . Chronic constipation   . Failed back syndrome   . Chronic pain syndrome   .  Radicular pain   . Chronic bilateral low back pain without sciatica   . Dysphagia, post-stroke   . Left hemiparesis ([place])   . Acute ischemic stroke ([place]) 12/15/2016  . Urinary retention 02/25/2015  . Primary osteoarthritis of left knee 02/15/2015  . Morbid obesity ([place]) 02/15/2015  . Essential hypertension, benign 09/07/2014  . Hyperlipidemia 09/05/2014  . Gout 09/05/2014  . Neuropathy 09/05/2014  . Radiculopathy 08/29/2014   Past Medical History:  Diagnosis Date  . Arthritis   .  Complication of anesthesia    afer nerve block difficult breathing (at [place] [facility] [address] transported to [facility])  . Dry skin   . GERD (gastroesophageal reflux disease)    "years ago", diet controlled  . Gout   . H/O pyelonephritis    as a child  . 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 Hypertension   . Impingement syndrome of right shoulder   . Neuropathy   . Osteoarthritis of AC (acromioclavicular) joint    Right  . Pneumonia   . Radiculopathy   . Rotator cuff tear, right   . Sleep apnea    does not use cpap  . Stroke ([place]) 12/15/2016   left side weakness    Family History  Problem Relation Age of Onset  . Varicose Veins Mother     Past Surgical History:  Procedure Laterality Date  . ABDOMINAL EXPOSURE N/A 08/29/2014   Procedure: ABDOMINAL EXPOSURE;  Surgeon: Rosetta Posner, MD;  Location: [place];  Service: Vascular;  Laterality: N/A;  . ABDOMINAL HYSTERECTOMY    . ANTERIOR LUMBAR FUSION N/A 08/29/2014   Procedure: ANTERIOR LUMBAR FUSION 1 LEVEL;  Surgeon: Sinclair Ship, MD;  Location: [place];  Service: Orthopedics;  Laterality: N/A;  Lumbar 4-5 anterior lumbar interbody fusion with allograft and instrumentation.  . APPENDECTOMY    . BACK SURGERY  08/28/2014 and 08/29/2014   lumbar fusion  . CARPAL TUNNEL RELEASE Right    release done twice  . COLONOSCOPY    . JOINT REPLACEMENT    . left knee replacement    . REPLACEMENT TOTAL KNEE  2010   rigth knee  . SHOULDER ARTHROSCOPY Right 01/18/2018   Procedure: RIGHT SHOULDER ARTHROSCOPY, SUBACROMIAL DECOMPRESSION, DISTAL CLAVICLE RESECTION WITH MINI OPEN ROTATOR CUFF REPAIR;  Surgeon: Garald Balding, MD;  Location: [place];  Service: Orthopedics;  Laterality: Right;  . TONSILLECTOMY    . TOTAL KNEE ARTHROPLASTY Left 02/15/2015   Procedure: TOTAL KNEE ARTHROPLASTY;  Surgeon: [name], MD;  Location: [place];  Service: Orthopedics;  Laterality: Left;   Social History   Occupational History  . Not  on file  Tobacco Use  . Smoking status: Current Some Day Smoker    Packs/day: 0.50    Types: Cigarettes  . Smokeless tobacco: Never Used  Substance and Sexual Activity  . Alcohol use: No  . Drug use: No    Comment: sometimes will take a vicodin from friend  . Sexual activity: No

## 2018-01-27 DIAGNOSIS — I639 Cerebral infarction, unspecified: Secondary | ICD-10-CM | POA: Diagnosis not present

## 2018-01-27 DIAGNOSIS — I10 Essential (primary) hypertension: Secondary | ICD-10-CM | POA: Diagnosis not present

## 2018-01-27 DIAGNOSIS — S46001A Unspecified injury of muscle(s) and tendon(s) of the rotator cuff of right shoulder, initial encounter: Secondary | ICD-10-CM | POA: Diagnosis not present

## 2018-02-01 ENCOUNTER — Encounter (INDEPENDENT_AMBULATORY_CARE_PROVIDER_SITE_OTHER): Payer: Self-pay | Admitting: Orthopaedic Surgery

## 2018-02-01 ENCOUNTER — Other Ambulatory Visit (INDEPENDENT_AMBULATORY_CARE_PROVIDER_SITE_OTHER): Payer: Self-pay | Admitting: Orthopedic Surgery

## 2018-02-01 ENCOUNTER — Other Ambulatory Visit (INDEPENDENT_AMBULATORY_CARE_PROVIDER_SITE_OTHER): Payer: Self-pay

## 2018-02-01 ENCOUNTER — Ambulatory Visit (INDEPENDENT_AMBULATORY_CARE_PROVIDER_SITE_OTHER): Payer: Medicare Other | Admitting: Orthopaedic Surgery

## 2018-02-01 VITALS — BP 117/71 | HR 73 | Resp 12 | Ht 60.0 in | Wt 245.0 lb

## 2018-02-01 DIAGNOSIS — G8929 Other chronic pain: Secondary | ICD-10-CM

## 2018-02-01 DIAGNOSIS — M75121 Complete rotator cuff tear or rupture of right shoulder, not specified as traumatic: Secondary | ICD-10-CM

## 2018-02-01 DIAGNOSIS — M25511 Pain in right shoulder: Secondary | ICD-10-CM

## 2018-02-01 MED ORDER — TRAMADOL HCL 50 MG PO TABS: 50.0000 mg | ORAL_TABLET | Freq: Two times a day (BID) | ORAL | 0 refills | Status: DC | PRN

## 2018-02-01 NOTE — Progress Notes (Signed)
Office Visit Note   Patient: Ellen Henry           Date of Birth: 1946/06/21           MRN: [id number] Visit Date: 02/01/2018              Requested by: Lucianne Lei, MD [address] [place], [place] [address] PCP: Lucianne Lei, MD   Assessment & Plan: Visit Diagnoses:  1. Chronic right shoulder pain     Plan: 2 weeks status post rotator cuff tear repair right shoulder with SCD and DCR. I released the biceps tendon. Doing well. Tramadol for pain has not been wearing the sling. We'll plan physical therapy time office 2 weeks and encourage patient to use the sling  Follow-Up Instructions: Return in about 2 weeks (around 02/15/2018).   Orders:  No orders of the defined types were placed in this encounter.  No orders of the defined types were placed in this encounter.     Procedures: No procedures performed   Clinical Data: No additional findings.   Subjective: Chief Complaint  Patient presents with  . Right Shoulder - Routine Post Op    [name] is  status post 2 .5 arthroscopic subacromial decompression distal clavicle resection and a mini open rotator cuff tear repair.and  release the biceps tendon . Pt ambulates with a cane. Still some pain, out of pain meds  No related fever or chills. No numbness or tingling  HPI  Review of Systems   Objective: Vital Signs: BP 117/71   Pulse 73   Resp 12   Ht 5' (1.524 m)   Wt 245 lb (111.1 kg)   BMI 47.85 kg/m   Physical Exam  Ortho Exam awake alert and oriented 3 comfortable sitting. Right shoulder incisions healing without problem. Good grip and good release. Neurovascular exam intact.range of motion right shoulder not attempted  Specialty Comments:  No specialty comments available.  Imaging: No results found.   PMFS History: Patient Active Problem List   Diagnosis Date Noted  . Complete tear of right rotator cuff 01/18/2018  . Impingement syndrome of right shoulder 01/18/2018  . AC  (acromioclavicular) arthritis 01/18/2018  . Rotator cuff tear 01/18/2018  . Low oxygen saturation 12/09/2017  . Chronic constipation   . Failed back syndrome   . Chronic pain syndrome   . Radicular pain   . Chronic bilateral low back pain without sciatica   . Dysphagia, post-stroke   . Left hemiparesis ([place])   . Acute ischemic stroke ([place]) 12/15/2016  . Urinary retention 02/25/2015  . Primary osteoarthritis of left knee 02/15/2015  . Morbid obesity ([place]) 02/15/2015  . Essential hypertension, benign 09/07/2014  . Hyperlipidemia 09/05/2014  . Gout 09/05/2014  . Neuropathy 09/05/2014  . Radiculopathy 08/29/2014   Past Medical History:  Diagnosis Date  . Arthritis   . Complication of anesthesia    afer nerve block difficult breathing (at [place] [facility] [address] transported to [facility])  . Dry skin   . GERD (gastroesophageal reflux disease)    "years ago", diet controlled  . Gout   . H/O pyelonephritis    as a child  . 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 Hypertension   . Impingement syndrome of right shoulder   . Neuropathy   . Osteoarthritis of AC (acromioclavicular) joint    Right  . Pneumonia   . Radiculopathy   . Rotator cuff tear, right   .  Sleep apnea    does not use cpap  . Stroke ([place]) 12/15/2016   left side weakness    Family History  Problem Relation Age of Onset  . Varicose Veins Mother     Past Surgical History:  Procedure Laterality Date  . ABDOMINAL EXPOSURE N/A 08/29/2014   Procedure: ABDOMINAL EXPOSURE;  Surgeon: Rosetta Posner, MD;  Location: [place];  Service: Vascular;  Laterality: N/A;  . ABDOMINAL HYSTERECTOMY    . ANTERIOR LUMBAR FUSION N/A 08/29/2014   Procedure: ANTERIOR LUMBAR FUSION 1 LEVEL;  Surgeon: Sinclair Ship, MD;  Location: [facility];  Service: Orthopedics;  Laterality: N/A;  Lumbar 4-5 anterior lumbar interbody fusion with allograft and instrumentation.  . APPENDECTOMY    . BACK SURGERY  08/28/2014 and 08/29/2014    lumbar fusion  . CARPAL TUNNEL RELEASE Right    release done twice  . COLONOSCOPY    . JOINT REPLACEMENT    . left knee replacement    . REPLACEMENT TOTAL KNEE  2010   rigth knee  . SHOULDER ARTHROSCOPY Right 01/18/2018   Procedure: RIGHT SHOULDER ARTHROSCOPY, SUBACROMIAL DECOMPRESSION, DISTAL CLAVICLE RESECTION WITH MINI OPEN ROTATOR CUFF REPAIR;  Surgeon: Garald Balding, MD;  Location: [facility];  Service: Orthopedics;  Laterality: Right;  . TONSILLECTOMY    . TOTAL KNEE ARTHROPLASTY Left 02/15/2015   Procedure: TOTAL KNEE ARTHROPLASTY;  Surgeon: [name], MD;  Location: [facility];  Service: Orthopedics;  Laterality: Left;   Social History   Occupational History  . Not on file  Tobacco Use  . Smoking status: Current Some Day Smoker    Packs/day: 0.50    Types: Cigarettes  . Smokeless tobacco: Never Used  Substance and Sexual Activity  . Alcohol use: No  . Drug use: No    Comment: sometimes will take a vicodin from friend  . Sexual activity: No

## 2018-02-08 ENCOUNTER — Ambulatory Visit: Payer: Medicare Other | Admitting: Physical Therapy

## 2018-02-18 ENCOUNTER — Encounter (INDEPENDENT_AMBULATORY_CARE_PROVIDER_SITE_OTHER): Payer: Self-pay | Admitting: Orthopaedic Surgery

## 2018-02-18 ENCOUNTER — Ambulatory Visit (INDEPENDENT_AMBULATORY_CARE_PROVIDER_SITE_OTHER): Payer: Medicare Other | Admitting: Orthopaedic Surgery

## 2018-02-18 VITALS — BP 139/82 | HR 85 | Resp 16 | Ht 60.0 in | Wt 236.0 lb

## 2018-02-18 DIAGNOSIS — M75121 Complete rotator cuff tear or rupture of right shoulder, not specified as traumatic: Secondary | ICD-10-CM

## 2018-02-18 MED ORDER — TRAMADOL HCL 50 MG PO TABS: 50.0000 mg | ORAL_TABLET | Freq: Three times a day (TID) | ORAL | 0 refills | Status: DC | PRN

## 2018-02-18 NOTE — Progress Notes (Signed)
Office Visit Note   Patient: Ellen Henry           Date of Birth: Nov 18, 1946           MRN: [id number] Visit Date: 02/18/2018              Requested by: Lucianne Lei, MD [address] [place], [place] [address] PCP: Lucianne Lei, MD   Assessment & Plan: Visit Diagnoses:  1. Complete tear of right rotator cuff     Plan: 1 month status post rotator cuff tear repair right shoulder, distal clavicle resection, subacromial decompression biceps tenotomy.  Still wears the sling.  Not having much pain but was unable to start physical therapy because she "had a cold".  Feeling better now and will call therapy to set up therapy starting next week.  Return to the office in 2 weeks.  Refill tramadol for pain when she starts therapy  Follow-Up Instructions: Return in about 2 weeks (around 03/04/2018).   Orders:  No orders of the defined types were placed in this encounter.  Meds ordered this encounter  Medications  . traMADol (ULTRAM) 50 MG tablet    Sig: Take 1 tablet (50 mg total) by mouth 3 (three) times daily as needed.    Dispense:  30 tablet    Refill:  0      Procedures: No procedures performed   Clinical Data: No additional findings.   Subjective: Chief Complaint  Patient presents with  . Right Shoulder - Pain    Ellen Henry IS 71 Y O F HERE FOR RIGHT SHOULDER POST OP SURGERY WAS 01-18-18  1 month postop.  Operative note is incorrect as it is it did not explain the entire procedure.  There was an open rotator cuff tear repair but also an arthroscopic distal clavicle resection, subacromial decompression and biceps tenotomy.  No related fever or chills although she had recently had a "cold".  No numbness or tingling.  HPI  Review of Systems   Objective: Vital Signs: BP 139/82 (BP Location: Left Arm, Patient Position: Sitting, Cuff Size: Normal)   Pulse 85   Resp 16   Ht 5' (1.524 m)   Wt 236 lb (107 kg)   BMI 46.09 kg/m   Physical Exam  Ortho Exam right  shoulder incisions are healing without problem.  There was a subcuticular stitch that is visualized but without infection.  I could abduct 90 degrees and flex about 140 degrees which point shoulder was a little tight.  Skin otherwise intact.  Neurovascular exam intact.  Specialty Comments:  No specialty comments available.  Imaging: No results found.   PMFS History: Patient Active Problem List   Diagnosis Date Noted  . Complete tear of right rotator cuff 01/18/2018  . Impingement syndrome of right shoulder 01/18/2018  . AC (acromioclavicular) arthritis 01/18/2018  . Rotator cuff tear 01/18/2018  . Low oxygen saturation 12/09/2017  . Chronic constipation   . Failed back syndrome   . Chronic pain syndrome   . Radicular pain   . Chronic bilateral low back pain without sciatica   . Dysphagia, post-stroke   . Left hemiparesis ([place])   . Acute ischemic stroke ([place]) 12/15/2016  . Urinary retention 02/25/2015  . Primary osteoarthritis of left knee 02/15/2015  . Morbid obesity ([place]) 02/15/2015  . Essential hypertension, benign 09/07/2014  . Hyperlipidemia 09/05/2014  . Gout 09/05/2014  . Neuropathy 09/05/2014  . Radiculopathy 08/29/2014   Past Medical History:  Diagnosis  Date  . Arthritis   . Complication of anesthesia    afer nerve block difficult breathing (at [facility] [place] transported to [facility])  . Dry skin   . GERD (gastroesophageal reflux disease)    "years ago", diet controlled  . Gout   . H/O pyelonephritis    as a child  . 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 Hypertension   . Impingement syndrome of right shoulder   . Neuropathy   . Osteoarthritis of AC (acromioclavicular) joint    Right  . Pneumonia   . Radiculopathy   . Rotator cuff tear, right   . Sleep apnea    does not use cpap  . Stroke ([place]) 12/15/2016   left side weakness    Family History  Problem Relation Age of Onset  . Varicose Veins Mother     Past Surgical  History:  Procedure Laterality Date  . ABDOMINAL EXPOSURE N/A 08/29/2014   Procedure: ABDOMINAL EXPOSURE;  Surgeon: Rosetta Posner, MD;  Location: [place];  Service: Vascular;  Laterality: N/A;  . ABDOMINAL HYSTERECTOMY    . ANTERIOR LUMBAR FUSION N/A 08/29/2014   Procedure: ANTERIOR LUMBAR FUSION 1 LEVEL;  Surgeon: Sinclair Ship, MD;  Location: [place];  Service: Orthopedics;  Laterality: N/A;  Lumbar 4-5 anterior lumbar interbody fusion with allograft and instrumentation.  . APPENDECTOMY    . BACK SURGERY  08/28/2014 and 08/29/2014   lumbar fusion  . CARPAL TUNNEL RELEASE Right    release done twice  . COLONOSCOPY    . JOINT REPLACEMENT    . left knee replacement    . REPLACEMENT TOTAL KNEE  2010   rigth knee  . SHOULDER ARTHROSCOPY Right 01/18/2018   Procedure: RIGHT SHOULDER ARTHROSCOPY, SUBACROMIAL DECOMPRESSION, DISTAL CLAVICLE RESECTION WITH MINI OPEN ROTATOR CUFF REPAIR;  Surgeon: Garald Balding, MD;  Location: [facility];  Service: Orthopedics;  Laterality: Right;  . TONSILLECTOMY    . TOTAL KNEE ARTHROPLASTY Left 02/15/2015   Procedure: TOTAL KNEE ARTHROPLASTY;  Surgeon: [name], MD;  Location: [facility];  Service: Orthopedics;  Laterality: Left;   Social History   Occupational History  . Not on file  Tobacco Use  . Smoking status: Current Some Day Smoker    Packs/day: 0.50    Types: Cigarettes  . Smokeless tobacco: Never Used  Substance and Sexual Activity  . Alcohol use: No  . Drug use: No    Comment: sometimes will take a vicodin from friend  . Sexual activity: No     Garald Balding, MD   Note - This record has been created using [employer].  Chart creation errors have been sought, but may not always  have been located. Such creation errors do not reflect on  the standard of medical care.

## 2018-02-18 NOTE — Progress Notes (Deleted)
Office Visit Note   Patient: Ellen Henry           Date of Birth: 01-Nov-1946           MRN: [id number] Visit Date: 02/18/2018              Requested by: Lucianne Lei, MD [address] [place], [place] [address] PCP: Lucianne Lei, MD   Assessment & Plan: Visit Diagnoses: No diagnosis found.  Plan: ***  Follow-Up Instructions: No Follow-up on file.   Orders:  No orders of the defined types were placed in this encounter.  No orders of the defined types were placed in this encounter.     Procedures: No procedures performed   Clinical Data: No additional findings.   Subjective: No chief complaint on file.   HPI  Review of Systems  Constitutional: Positive for fatigue. Negative for fever.  HENT: Negative for ear pain.   Eyes: Negative for pain.  Respiratory: Positive for cough and shortness of breath.   Cardiovascular: Negative for leg swelling.  Gastrointestinal: Negative for blood in stool, constipation and diarrhea.  Genitourinary: Negative for dysuria.  Musculoskeletal: Negative for back pain and neck pain.  Skin: Negative for rash and wound.  Allergic/Immunologic: Positive for food allergies.  Neurological: Positive for headaches. Negative for dizziness and light-headedness.  Hematological: Bruises/bleeds easily.  Psychiatric/Behavioral: Positive for sleep disturbance.     Objective: Vital Signs: There were no vitals taken for this visit.  Physical Exam  Ortho Exam  Specialty Comments:  No specialty comments available.  Imaging: No results found.   PMFS History: Patient Active Problem List   Diagnosis Date Noted  . Complete tear of right rotator cuff 01/18/2018  . Impingement syndrome of right shoulder 01/18/2018  . AC (acromioclavicular) arthritis 01/18/2018  . Rotator cuff tear 01/18/2018  . Low oxygen saturation 12/09/2017  . Chronic constipation   . Failed back syndrome   . Chronic pain syndrome   . Radicular pain   .  Chronic bilateral low back pain without sciatica   . Dysphagia, post-stroke   . Left hemiparesis ([place])   . Acute ischemic stroke ([place]) 12/15/2016  . Urinary retention 02/25/2015  . Primary osteoarthritis of left knee 02/15/2015  . Morbid obesity ([place]) 02/15/2015  . Essential hypertension, benign 09/07/2014  . Hyperlipidemia 09/05/2014  . Gout 09/05/2014  . Neuropathy 09/05/2014  . Radiculopathy 08/29/2014   Past Medical History:  Diagnosis Date  . Arthritis   . Complication of anesthesia    afer nerve block difficult breathing (at [place] [facility] [place] transported to [facility])  . Dry skin   . GERD (gastroesophageal reflux disease)    "years ago", diet controlled  . Gout   . H/O pyelonephritis    as a child  . 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 Hypertension   . Impingement syndrome of right shoulder   . Neuropathy   . Osteoarthritis of AC (acromioclavicular) joint    Right  . Pneumonia   . Radiculopathy   . Rotator cuff tear, right   . Sleep apnea    does not use cpap  . Stroke ([place]) 12/15/2016   left side weakness    Family History  Problem Relation Age of Onset  . Varicose Veins Mother     Past Surgical History:  Procedure Laterality Date  . ABDOMINAL EXPOSURE N/A 08/29/2014   Procedure: ABDOMINAL EXPOSURE;  Surgeon: Rosetta Posner, MD;  [facility];  Service: Vascular;  Laterality: N/A;  . ABDOMINAL HYSTERECTOMY    . ANTERIOR LUMBAR FUSION N/A 08/29/2014   Procedure: ANTERIOR LUMBAR FUSION 1 LEVEL;  Surgeon: Sinclair Ship, MD;  Location: [facility];  Service: Orthopedics;  Laterality: N/A;  Lumbar 4-5 anterior lumbar interbody fusion with allograft and instrumentation.  . APPENDECTOMY    . BACK SURGERY  08/28/2014 and 08/29/2014   lumbar fusion  . CARPAL TUNNEL RELEASE Right    release done twice  . COLONOSCOPY    . JOINT REPLACEMENT    . left knee replacement    . REPLACEMENT TOTAL KNEE  2010   rigth knee  . SHOULDER ARTHROSCOPY  Right 01/18/2018   Procedure: RIGHT SHOULDER ARTHROSCOPY, SUBACROMIAL DECOMPRESSION, DISTAL CLAVICLE RESECTION WITH MINI OPEN ROTATOR CUFF REPAIR;  Surgeon: Garald Balding, MD;  Location: [facility];  Service: Orthopedics;  Laterality: Right;  . TONSILLECTOMY    . TOTAL KNEE ARTHROPLASTY Left 02/15/2015   Procedure: TOTAL KNEE ARTHROPLASTY;  Surgeon: [name], MD;  Location: [facility];  Service: Orthopedics;  Laterality: Left;   Social History   Occupational History  . Not on file  Tobacco Use  . Smoking status: Current Some Day Smoker    Packs/day: 0.50    Types: Cigarettes  . Smokeless tobacco: Never Used  Substance and Sexual Activity  . Alcohol use: No  . Drug use: No    Comment: sometimes will take a vicodin from friend  . Sexual activity: No

## 2018-02-28 DIAGNOSIS — I10 Essential (primary) hypertension: Secondary | ICD-10-CM | POA: Diagnosis not present

## 2018-02-28 DIAGNOSIS — Z8673 Personal history of transient ischemic attack (TIA), and cerebral infarction without residual deficits: Secondary | ICD-10-CM | POA: Diagnosis not present

## 2018-02-28 DIAGNOSIS — E0800 Diabetes mellitus due to underlying condition with hyperosmolarity without nonketotic hyperglycemic-hyperosmolar coma (NKHHC): Secondary | ICD-10-CM | POA: Diagnosis not present

## 2018-02-28 DIAGNOSIS — E118 Type 2 diabetes mellitus with unspecified complications: Secondary | ICD-10-CM | POA: Diagnosis not present

## 2018-03-01 ENCOUNTER — Ambulatory Visit: Payer: Medicare Other | Attending: Orthopaedic Surgery | Admitting: Physical Therapy

## 2018-03-01 ENCOUNTER — Encounter: Payer: Self-pay | Admitting: Physical Therapy

## 2018-03-01 ENCOUNTER — Other Ambulatory Visit: Payer: Self-pay

## 2018-03-01 DIAGNOSIS — M25611 Stiffness of right shoulder, not elsewhere classified: Secondary | ICD-10-CM | POA: Diagnosis not present

## 2018-03-01 DIAGNOSIS — M25511 Pain in right shoulder: Secondary | ICD-10-CM | POA: Diagnosis not present

## 2018-03-01 DIAGNOSIS — M6281 Muscle weakness (generalized): Secondary | ICD-10-CM | POA: Diagnosis not present

## 2018-03-01 NOTE — Therapy (Signed)
[facility] [address], [place], [place], [address] Phone: [phone]   Fax:  [phone]  Physical Therapy Evaluation  Patient Details  Name: Ellen Henry MRN: [id number] Date of Birth: November 11, 1946 Referring Provider: Antony Contras, MD   Encounter Date: 03/01/2018  PT End of Session - 03/01/18 1740    Visit Number  1    Date for PT Re-Evaluation  04/26/18    PT Start Time  1324    PT Stop Time  1227    PT Time Calculation (min)  39 min    Activity Tolerance  Patient tolerated treatment well    Behavior During Therapy  [facility] for tasks assessed/performed       Past Medical History:  Diagnosis Date  . Arthritis   . Complication of anesthesia    afer nerve block difficult breathing (at [facility] [place] transported to [facility])  . Dry skin   . GERD (gastroesophageal reflux disease)    "years ago", diet controlled  . Gout   . H/O pyelonephritis    as a child  . 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 Hypertension   . Impingement syndrome of right shoulder   . Neuropathy   . Osteoarthritis of AC (acromioclavicular) joint    Right  . Pneumonia   . Radiculopathy   . Rotator cuff tear, right   . Sleep apnea    does not use cpap  . Stroke ([place]) 12/15/2016   left side weakness    Past Surgical History:  Procedure Laterality Date  . ABDOMINAL EXPOSURE N/A 08/29/2014   Procedure: ABDOMINAL EXPOSURE;  Surgeon: Rosetta Posner, MD;  Location: [place];  Service: Vascular;  Laterality: N/A;  . ABDOMINAL HYSTERECTOMY    . ANTERIOR LUMBAR FUSION N/A 08/29/2014   Procedure: ANTERIOR LUMBAR FUSION 1 LEVEL;  Surgeon: Sinclair Ship, MD;  Location: [facility] [place];  Service: Orthopedics;  Laterality: N/A;  Lumbar 4-5 anterior lumbar interbody fusion with allograft and instrumentation.  . APPENDECTOMY    . BACK SURGERY  08/28/2014 and 08/29/2014   lumbar fusion  . CARPAL TUNNEL RELEASE Right    release  done twice  . COLONOSCOPY    . JOINT REPLACEMENT    . left knee replacement    . REPLACEMENT TOTAL KNEE  2010   rigth knee  . SHOULDER ARTHROSCOPY Right 01/18/2018   Procedure: RIGHT SHOULDER ARTHROSCOPY, SUBACROMIAL DECOMPRESSION, DISTAL CLAVICLE RESECTION WITH MINI OPEN ROTATOR CUFF REPAIR;  Surgeon: Garald Balding, MD;  Location: [facility];  Service: Orthopedics;  Laterality: Right;  . TONSILLECTOMY    . TOTAL KNEE ARTHROPLASTY Left 02/15/2015   Procedure: TOTAL KNEE ARTHROPLASTY;  Surgeon: [name], MD;  Location: [place];  Service: Orthopedics;  Laterality: Left;    There were no vitals filed for this visit.   Subjective Assessment - 03/01/18 1153    Subjective  Pt had shoulder RTC repair last month.  She takes care of grandchildren 32,17, 96 y/o.  Pt lives with her daughter and is planning on moving in the next month.     Limitations  Lifting;Reading    Patient Stated Goals  strength and pain management    Currently in Pain?  Yes    Pain Score  4     Pain Location  Shoulder    Pain Orientation  Right    Pain Descriptors / Indicators  Throbbing    Pain Type  Surgical  pain    Pain Onset  More than a month ago    Pain Frequency  Intermittent    Aggravating Factors   hanging arm to the side    Pain Relieving Factors  pain medication, tylenol, usually wears sling    Effect of Pain on Daily Activities  sleeping not good, caring for grandchildren    Multiple Pain Sites  No         [facility] PT Assessment - 03/01/18 0001      Assessment   Medical Diagnosis  M75.121 (ICD-10-CM) - Complete tear of right rotator cuff    Onset Date/Surgical Date  01/18/18    Hand Dominance  Right    Next MD Visit  next week    Prior Therapy  No      Precautions   Precautions  Shoulder    Type of Shoulder Precautions  s/p right RTC surgery 01/18/18    Shoulder Interventions  Shoulder sling/immobilizer    Precaution Booklet Issued  No    Precaution Comments  using standard protocol unless otherwise  noted      Restrictions   Weight Bearing Restrictions  No      Balance Screen   Has the patient fallen in the past 6 months  No      [place] residence    Living Arrangements  Children;Other relatives with daughter and 3 grandchildren; moving soon      Prior Function   Level of Independence  Independent      Cognition   Overall Cognitive Status  Within Functional Limits for tasks assessed      Observation/Other Assessments   Focus on Therapeutic Outcomes (FOTO)   56% limited      Posture/Postural Control   Posture/Postural Control  Postural limitations    Postural Limitations  Rounded Shoulders;Increased thoracic kyphosis      ROM / Strength   AROM / PROM / Strength  PROM;Strength      PROM   PROM Assessment Site  Shoulder    Right/Left Shoulder  Right    Right Shoulder Flexion  90 Degrees    Right Shoulder ABduction  25 Degrees    Right Shoulder Internal Rotation  -- to abdomen from position of maximum abduction    Right Shoulder External Rotation  40 Degrees      Strength   Overall Strength Comments  unable to assess, grossly 2/5 right shoulder due to limitation      Flexibility   Soft Tissue Assessment /Muscle Length  -- guarded      Palpation   Palpation comment  guarding of deltoids, upper trap, pecs      Ambulation/Gait   Gait Pattern  Within Functional Limits              No data recorded  Objective measurements completed on examination: See above findings.      [facility] Adult PT Treatment/Exercise - 03/01/18 0001      Self-Care   Self-Care  Other Self-Care Comments    Other Self-Care Comments   HEP             PT Education - 03/01/18 1228    Education provided  Yes    Education Details  PROM table slides    Person(s) Educated  Patient    Methods  Explanation;Demonstration;Handout;Verbal cues    Comprehension  Verbalized understanding;Returned demonstration       PT Short Term Goals -  03/01/18 1754  PT SHORT TERM GOAL #1   Title  patient will be ind with initial HEP    Baseline  ...    Time  4    Period  Weeks    Status  New    Target Date  03/29/18      PT SHORT TERM GOAL #2   Title  Pt will have full painfree PROM Rt shoulder    Baseline  .Mar[place] Kitchen    Time  4    Period  Weeks    Status  New    Target Date  03/29/18      PT SHORT TERM GOAL #3   Title  ...    Baseline  ...      PT SHORT TERM GOAL #4   Title  ...    Baseline  ...        PT Long Term Goals - 03/01/18 1756      PT LONG TERM GOAL #1   Title  ind with advanced HEP    Time  8    Period  Weeks    Status  New    Target Date  04/26/18      PT LONG TERM GOAL #2   Title  FOTO <or = to 38% limited    Time  8    Period  Weeks    Status  New    Target Date  04/26/18      PT LONG TERM GOAL #3   Title  Patient will be able to return to performing household chores such as laundry without increased pain    Time  8    Period  Weeks    Status  New    Target Date  04/26/18      PT LONG TERM GOAL #4   Title  Pt will have > or = to 140 degrees AROM right shoulder flexion and abduction    Time  8    Period  Weeks    Status  New    Target Date  04/26/18      PT LONG TERM GOAL #5   Title  Pt will demonstrate 4+/5 MMT right shoulder flexion and abcution for return to functional reaching activities    Time  8    Period  Weeks    Status  New    Target Date  04/26/18             Plan - 03/01/18 1742    Clinical Impression Statement  Pt presents to clinic s/p RTC repair 01/18/18.  Pt has referral for PROM and will f/u next week with MD.  Per patient chart she had distal clavicle resection, subacromial decompression biceps tenotomy.  Pt is 2/5 MMT throughout due to unable to have resistance.  She has reduced ROM as noted above and only able to perform PROM at this time.  Pt has posture deficits.  She will benefit from skilled PT to address impairments.    History and Personal Factors  relevant to plan of care:  s/p RTC repair 01/18/18    Clinical Presentation  Stable    Clinical Presentation due to:  pt is stable    Clinical Decision Making  Low    Rehab Potential  Excellent    PT Frequency  2x / week    PT Duration  8 weeks    PT Treatment/Interventions  ADLs/Self Care Home Management;Cryotherapy;Electrical Stimulation;Moist Heat;Therapeutic activities;Therapeutic exercise;Neuromuscular re-education;Patient/family education;Manual techniques;Taping;Dry needling;Passive range of motion  PT Next Visit Plan  PROM, progress per protocol, posture and scap squeezes    PT Home Exercise Plan  progress as needed    Consulted and Agree with Plan of Care  Patient       Patient will benefit from skilled therapeutic intervention in order to improve the following deficits and impairments:  Impaired UE functional use, Pain, Decreased strength, Impaired flexibility, Postural dysfunction, Increased muscle spasms, Increased fascial restricitons  Visit Diagnosis: Acute pain of right shoulder  Muscle weakness (generalized)  Stiffness of right shoulder, not elsewhere classified     Problem List Patient Active Problem List   Diagnosis Date Noted  . Complete tear of right rotator cuff 01/18/2018  . Impingement syndrome of right shoulder 01/18/2018  . AC (acromioclavicular) arthritis 01/18/2018  . Rotator cuff tear 01/18/2018  . Low oxygen saturation 12/09/2017  . Chronic constipation   . Failed back syndrome   . Chronic pain syndrome   . Radicular pain   . Chronic bilateral low back pain without sciatica   . Dysphagia, post-stroke   . Left hemiparesis ([place])   . Acute ischemic stroke ([place]) 12/15/2016  . Urinary retention 02/25/2015  . Primary osteoarthritis of left knee 02/15/2015  . Morbid obesity ([place]) 02/15/2015  . Essential hypertension, benign 09/07/2014  . Hyperlipidemia 09/05/2014  . Gout 09/05/2014  . Neuropathy 09/05/2014  . Radiculopathy 08/29/2014    Zannie Cove, PT 03/01/2018, 6:06 PM  [facility] [address] [place], [place], [address] Phone: [phone]   Fax:  [phone]  Name: Ellen Henry MRN: [id number] Date of Birth: [birth year]

## 2018-03-01 NOTE — Patient Instructions (Signed)
   PROM Shoulder Flexion on Table  Rest your affected shoulder's hand on a counter or table as pictured. Lean forward allowing your hand to slide forward increasing the range of motion at your shoulder. Lean back and repeat. Perform this exercise in a relatively pain-free range of motion. Hold 5 sec, repeat 10x, do 3x/day    PROM Shoulder Abduction on Table  Rest your affected shoulder's hand on a counter or table as pictured to the side of your body. Lean toward the table/counter allowing your hand to slide across increasing the range of motion at your shoulder going out to the side. Lean back and repeat. Perform this exercise in a relatively pain-free range of motion. Hold 5 sec, repeat 10x, do 3x/day     Table Slide: External Rotation  Begin seated next to table with elbow even with shoulder, flexed to 90 degrees, and palm flat on table. Bend straight forward at the waist, keeping elbow even with shoulder as it slides forward until point of stretch. Return to sitting upright. Hold 5 sec, repeat 10x, do 3x/day  [facility] [address] [place], [place] [address] Phone # [phone] Fax [phone]

## 2018-03-08 ENCOUNTER — Encounter: Payer: Self-pay | Admitting: Physical Therapy

## 2018-03-08 ENCOUNTER — Ambulatory Visit: Payer: Medicare Other | Attending: Orthopaedic Surgery | Admitting: Physical Therapy

## 2018-03-08 DIAGNOSIS — M25511 Pain in right shoulder: Secondary | ICD-10-CM | POA: Diagnosis not present

## 2018-03-08 DIAGNOSIS — M6281 Muscle weakness (generalized): Secondary | ICD-10-CM | POA: Diagnosis not present

## 2018-03-08 DIAGNOSIS — M25611 Stiffness of right shoulder, not elsewhere classified: Secondary | ICD-10-CM

## 2018-03-08 NOTE — Patient Instructions (Signed)
Access Code: [email or web address]  URL: [email or web address]  Date: 03/08/2018  Prepared by: Faustino Congress   Exercises  Supine Chin Tuck - 10 reps - 1 sets - 5 sec hold - 2x daily - 7x weekly  Supine Scapular Retraction - 10 reps - 1 sets - 5 sec hold - 2x daily - 7x weekly  Standing Backward Shoulder Rolls - 10 reps - 1 sets - 2x daily - 7x weekly

## 2018-03-08 NOTE — Therapy (Signed)
[facility] [address] [place], [place], [address] Phone: [phone]   Fax:  [phone]  Physical Therapy Treatment  Patient Details  Name: Ellen MOOERS MRN: [id number] Date of Birth: 05-24-1946 No data recorded  Encounter Date: 03/08/2018  PT End of Session - 03/08/18 1224    Visit Number  2    Date for PT Re-Evaluation  04/26/18    PT Start Time  1146    PT Stop Time  1228    PT Time Calculation (min)  42 min    Activity Tolerance  Patient tolerated treatment well    Behavior During Therapy  [facility] for tasks assessed/performed       Past Medical History:  Diagnosis Date  . Arthritis   . Complication of anesthesia    afer nerve block difficult breathing (at [place] transported to [facility])  . Dry skin   . GERD (gastroesophageal reflux disease)    "years ago", diet controlled  . Gout   . H/O pyelonephritis    as a child  . 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 Hypertension   . Impingement syndrome of right shoulder   . Neuropathy   . Osteoarthritis of AC (acromioclavicular) joint    Right  . Pneumonia   . Radiculopathy   . Rotator cuff tear, right   . Sleep apnea    does not use cpap  . Stroke ([place]) 12/15/2016   left side weakness    Past Surgical History:  Procedure Laterality Date  . ABDOMINAL EXPOSURE N/A 08/29/2014   Procedure: ABDOMINAL EXPOSURE;  Surgeon: Rosetta Posner, MD;  Location: [place];  Service: Vascular;  Laterality: N/A;  . ABDOMINAL HYSTERECTOMY    . ANTERIOR LUMBAR FUSION N/A 08/29/2014   Procedure: ANTERIOR LUMBAR FUSION 1 LEVEL;  Surgeon: Sinclair Ship, MD;  Location: [place];  Service: Orthopedics;  Laterality: N/A;  Lumbar 4-5 anterior lumbar interbody fusion with allograft and instrumentation.  . APPENDECTOMY    . BACK SURGERY  08/28/2014 and 08/29/2014   lumbar fusion  . CARPAL TUNNEL RELEASE Right    release done twice  .  COLONOSCOPY    . JOINT REPLACEMENT    . left knee replacement    . REPLACEMENT TOTAL KNEE  2010   rigth knee  . SHOULDER ARTHROSCOPY Right 01/18/2018   Procedure: RIGHT SHOULDER ARTHROSCOPY, SUBACROMIAL DECOMPRESSION, DISTAL CLAVICLE RESECTION WITH MINI OPEN ROTATOR CUFF REPAIR;  Surgeon: Garald Balding, MD;  Location: [place];  Service: Orthopedics;  Laterality: Right;  . TONSILLECTOMY    . TOTAL KNEE ARTHROPLASTY Left 02/15/2015   Procedure: TOTAL KNEE ARTHROPLASTY;  Surgeon: [name], MD;  Location: [place];  Service: Orthopedics;  Laterality: Left;    There were no vitals filed for this visit.  Subjective Assessment - 03/08/18 1149    Subjective  Rt shoulder is a little painful today, but overall doing well.    Patient Stated Goals  strength and pain management    Currently in Pain?  Yes    Pain Score  5     Pain Location  Shoulder    Pain Orientation  Right    Pain Descriptors / Indicators  Sharp    Pain Type  Surgical pain    Pain Onset  More than a month ago    Pain Frequency  Intermittent    Aggravating Factors   hanging arm to the side  Pain Relieving Factors  pain medication, tylenol                       [facility] Adult PT Treatment/Exercise - 03/08/18 1209      Self-Care   Self-Care  Scar Mobilizations    Scar Mobilizations  instructed in how to perform      Exercises   Exercises  Neck;Shoulder      Neck Exercises: Seated   Shoulder Rolls  Backwards;15 reps      Neck Exercises: Supine   Neck Retraction  10 reps;5 secs      Shoulder Exercises: Supine   Other Supine Exercises  scapular retraction 10 x 5 sec      Modalities   Modalities  Vasopneumatic      Vasopneumatic   Number Minutes Vasopneumatic   10 minutes    Vasopnuematic Location   Shoulder    Vasopneumatic Pressure  Low    Vasopneumatic Temperature   max cold      Manual Therapy   Manual Therapy  Passive ROM;Other (comment)    Passive ROM  Rt shoulder with gentle GH  distraction all directions within protocol limitations    Other Manual Therapy  scar mobilization             PT Education - 03/08/18 1222    Education provided  Yes    Education Details  HEP, scar massage    Person(s) Educated  Patient    Methods  Explanation;Demonstration;Handout    Comprehension  Returned demonstration;Verbalized understanding;Need further instruction       PT Short Term Goals - 03/01/18 1754      PT SHORT TERM GOAL #1   Title  patient will be ind with initial HEP    Baseline  ...    Time  4    Period  Weeks    Status  New    Target Date  03/29/18      PT SHORT TERM GOAL #2   Title  Pt will have full painfree PROM Rt shoulder    Baseline  .Marland Kitchen    Time  4    Period  Weeks    Status  New    Target Date  03/29/18      PT SHORT TERM GOAL #3   Title  ...    Baseline  ...      PT SHORT TERM GOAL #4   Title  ...    Baseline  ...        PT Long Term Goals - 03/01/18 1756      PT LONG TERM GOAL #1   Title  ind with advanced HEP    Time  8    Period  Weeks    Status  New    Target Date  04/26/18      PT LONG TERM GOAL #2   Title  FOTO <or = to 38% limited    Time  8    Period  Weeks    Status  New    Target Date  04/26/18      PT LONG TERM GOAL #3   Title  Patient will be able to return to performing household chores such as laundry without increased pain    Time  8    Period  Weeks    Status  New    Target Date  04/26/18      PT LONG TERM GOAL #4   Title  Pt will have > or = to 140 degrees AROM right shoulder flexion and abduction    Time  8    Period  Weeks    Status  New    Target Date  04/26/18      PT LONG TERM GOAL #5   Title  Pt will demonstrate 4+/5 MMT right shoulder flexion and abcution for return to functional reaching activities    Time  8    Period  Weeks    Status  New    Target Date  04/26/18            Plan - 03/08/18 1224    Clinical Impression Statement  Pt tolerated session well today with PROM at  end range of protocol limitations at this time.  Initiated posture HEP today, and pt performed with min cues.  Progressing well with PT.    Rehab Potential  Excellent    PT Frequency  2x / week    PT Duration  8 weeks    PT Treatment/Interventions  ADLs/Self Care Home Management;Cryotherapy;Electrical Stimulation;Moist Heat;Therapeutic activities;Therapeutic exercise;Neuromuscular re-education;Patient/family education;Manual techniques;Taping;Dry needling;Passive range of motion    PT Next Visit Plan  PROM, progress per protocol, posture and scap squeezes    PT Home Exercise Plan  progress as needed    Consulted and Agree with Plan of Care  Patient       Patient will benefit from skilled therapeutic intervention in order to improve the following deficits and impairments:  Impaired UE functional use, Pain, Decreased strength, Impaired flexibility, Postural dysfunction, Increased muscle spasms, Increased fascial restricitons  Visit Diagnosis: Acute pain of right shoulder  Muscle weakness (generalized)  Stiffness of right shoulder, not elsewhere classified     Problem List Patient Active Problem List   Diagnosis Date Noted  . Complete tear of right rotator cuff 01/18/2018  . Impingement syndrome of right shoulder 01/18/2018  . AC (acromioclavicular) arthritis 01/18/2018  . Rotator cuff tear 01/18/2018  . Low oxygen saturation 12/09/2017  . Chronic constipation   . Failed back syndrome   . Chronic pain syndrome   . Radicular pain   . Chronic bilateral low back pain without sciatica   . Dysphagia, post-stroke   . Left hemiparesis ([place])   . Acute ischemic stroke ([place]) 12/15/2016  . Urinary retention 02/25/2015  . Primary osteoarthritis of left knee 02/15/2015  . Morbid obesity ([place]) 02/15/2015  . Essential hypertension, benign 09/07/2014  . Hyperlipidemia 09/05/2014  . Gout 09/05/2014  . Neuropathy 09/05/2014  . Radiculopathy 08/29/2014      Laureen Abrahams, PT,  DPT 03/08/18 12:26 PM    [facility] [address] [place], [place], [address] Phone: [phone]   Fax:  [phone]  Name: SHERENA MACHORRO MRN: [id number] Date of Birth: 19-Sep-1946

## 2018-03-10 ENCOUNTER — Encounter: Payer: Self-pay | Admitting: Physical Therapy

## 2018-03-15 ENCOUNTER — Encounter: Payer: Self-pay | Admitting: Physical Therapy

## 2018-03-15 ENCOUNTER — Ambulatory Visit: Payer: Medicare Other | Admitting: Physical Therapy

## 2018-03-15 DIAGNOSIS — M6281 Muscle weakness (generalized): Secondary | ICD-10-CM | POA: Diagnosis not present

## 2018-03-15 DIAGNOSIS — M25611 Stiffness of right shoulder, not elsewhere classified: Secondary | ICD-10-CM | POA: Diagnosis not present

## 2018-03-15 DIAGNOSIS — M25511 Pain in right shoulder: Secondary | ICD-10-CM

## 2018-03-15 NOTE — Therapy (Signed)
[facility] [address] [place], [place], [address] Phone: [phone]   Fax:  [phone]  Physical Therapy Treatment  Patient Details  Name: Ellen Henry MRN: [id number] Date of Birth: 1946/07/07 No data recorded  Encounter Date: 03/15/2018  PT End of Session - 03/15/18 1218    Visit Number  3    Date for PT Re-Evaluation  04/26/18    PT Start Time  1147    PT Stop Time  1227    PT Time Calculation (min)  40 min    Activity Tolerance  Patient tolerated treatment well    Behavior During Therapy  [facility] for tasks assessed/performed       Past Medical History:  Diagnosis Date  . Arthritis   . Complication of anesthesia    afer nerve block difficult breathing (at [place] [address] transported to [facility])  . Dry skin   . GERD (gastroesophageal reflux disease)    "years ago", diet controlled  . Gout   . H/O pyelonephritis    as a child  . 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 Hypertension   . Impingement syndrome of right shoulder   . Neuropathy   . Osteoarthritis of AC (acromioclavicular) joint    Right  . Pneumonia   . Radiculopathy   . Rotator cuff tear, right   . Sleep apnea    does not use cpap  . Stroke ([place]) 12/15/2016   left side weakness    Past Surgical History:  Procedure Laterality Date  . ABDOMINAL EXPOSURE N/A 08/29/2014   Procedure: ABDOMINAL EXPOSURE;  Surgeon: Rosetta Posner, MD;  Location: [place];  Service: Vascular;  Laterality: N/A;  . ABDOMINAL HYSTERECTOMY    . ANTERIOR LUMBAR FUSION N/A 08/29/2014   Procedure: ANTERIOR LUMBAR FUSION 1 LEVEL;  Surgeon: Sinclair Ship, MD;  Location: [place];  Service: Orthopedics;  Laterality: N/A;  Lumbar 4-5 anterior lumbar interbody fusion with allograft and instrumentation.  . APPENDECTOMY    . BACK SURGERY  08/28/2014 and 08/29/2014   lumbar fusion  . CARPAL TUNNEL RELEASE Right    release done twice  .  COLONOSCOPY    . JOINT REPLACEMENT    . left knee replacement    . REPLACEMENT TOTAL KNEE  2010   rigth knee  . SHOULDER ARTHROSCOPY Right 01/18/2018   Procedure: RIGHT SHOULDER ARTHROSCOPY, SUBACROMIAL DECOMPRESSION, DISTAL CLAVICLE RESECTION WITH MINI OPEN ROTATOR CUFF REPAIR;  Surgeon: Garald Balding, MD;  Location: [place];  Service: Orthopedics;  Laterality: Right;  . TONSILLECTOMY    . TOTAL KNEE ARTHROPLASTY Left 02/15/2015   Procedure: TOTAL KNEE ARTHROPLASTY;  Surgeon: [name], MD;  Location: [place];  Service: Orthopedics;  Laterality: Left;    There were no vitals filed for this visit.  Subjective Assessment - 03/15/18 1149    Subjective  shoulder is a little stiff and sore    Patient Stated Goals  strength and pain management    Currently in Pain?  Yes    Pain Score  4     Pain Location  Shoulder    Pain Orientation  Right    Pain Descriptors / Indicators  Sharp    Pain Type  Surgical pain    Pain Onset  More than a month ago    Pain Frequency  Intermittent  [facility] Adult PT Treatment/Exercise - 03/15/18 0001      Elbow Exercises   Elbow Flexion  Right;20 reps;Supine;Bar weights/barbell 2#      Neck Exercises: Supine   Neck Retraction  10 reps;5 secs      Shoulder Exercises: Supine   Other Supine Exercises  scapular retraction 10 x 5 sec      Vasopneumatic   Number Minutes Vasopneumatic   10 minutes    Vasopnuematic Location   Shoulder    Vasopneumatic Pressure  Low      Manual Therapy   Manual Therapy  Passive ROM;Other (comment)    Passive ROM  Rt shoulder with gentle GH distraction all directions within protocol limitations    Other Manual Therapy  scar mobilization               PT Short Term Goals - 03/01/18 1754      PT SHORT TERM GOAL #1   Title  patient will be ind with initial HEP    Baseline  ...    Time  4    Period  Weeks    Status  New    Target Date  03/29/18      PT SHORT TERM GOAL #2    Title  Pt will have full painfree PROM Rt shoulder    Baseline  .Marland Kitchen    Time  4    Period  Weeks    Status  New    Target Date  03/29/18      PT SHORT TERM GOAL #3   Title  ...    Baseline  ...      PT SHORT TERM GOAL #4   Title  ...    Baseline  ...        PT Long Term Goals - 03/01/18 1756      PT LONG TERM GOAL #1   Title  ind with advanced HEP    Time  8    Period  Weeks    Status  New    Target Date  04/26/18      PT LONG TERM GOAL #2   Title  FOTO <or = to 38% limited    Time  8    Period  Weeks    Status  New    Target Date  04/26/18      PT LONG TERM GOAL #3   Title  Patient will be able to return to performing household chores such as laundry without increased pain    Time  8    Period  Weeks    Status  New    Target Date  04/26/18      PT LONG TERM GOAL #4   Title  Pt will have > or = to 140 degrees AROM right shoulder flexion and abduction    Time  8    Period  Weeks    Status  New    Target Date  04/26/18      PT LONG TERM GOAL #5   Title  Pt will demonstrate 4+/5 MMT right shoulder flexion and abcution for return to functional reaching activities    Time  8    Period  Weeks    Status  New    Target Date  04/26/18            Plan - 03/15/18 1218    Clinical Impression Statement  Pt demonstrating great PROM all within protocol limitations today.  Pt doesn't have  follow up MD appt scheduled so advised to call for appt in order to make sure safe to progress through protocol phases.      Rehab Potential  Excellent    PT Frequency  2x / week    PT Duration  8 weeks    PT Treatment/Interventions  ADLs/Self Care Home Management;Cryotherapy;Electrical Stimulation;Moist Heat;Therapeutic activities;Therapeutic exercise;Neuromuscular re-education;Patient/family education;Manual techniques;Taping;Dry needling;Passive range of motion    PT Next Visit Plan  PROM, progress per protocol, posture and scap squeezes    PT Home Exercise Plan  progress as  needed    Consulted and Agree with Plan of Care  Patient       Patient will benefit from skilled therapeutic intervention in order to improve the following deficits and impairments:  Impaired UE functional use, Pain, Decreased strength, Impaired flexibility, Postural dysfunction, Increased muscle spasms, Increased fascial restricitons  Visit Diagnosis: Acute pain of right shoulder  Muscle weakness (generalized)  Stiffness of right shoulder, not elsewhere classified     Problem List Patient Active Problem List   Diagnosis Date Noted  . Complete tear of right rotator cuff 01/18/2018  . Impingement syndrome of right shoulder 01/18/2018  . AC (acromioclavicular) arthritis 01/18/2018  . Rotator cuff tear 01/18/2018  . Low oxygen saturation 12/09/2017  . Chronic constipation   . Failed back syndrome   . Chronic pain syndrome   . Radicular pain   . Chronic bilateral low back pain without sciatica   . Dysphagia, post-stroke   . Left hemiparesis ([place])   . Acute ischemic stroke ([place]) 12/15/2016  . Urinary retention 02/25/2015  . Primary osteoarthritis of left knee 02/15/2015  . Morbid obesity ([place]) 02/15/2015  . Essential hypertension, benign 09/07/2014  . Hyperlipidemia 09/05/2014  . Gout 09/05/2014  . Neuropathy 09/05/2014  . Radiculopathy 08/29/2014      Laureen Abrahams, PT, DPT 03/15/18 12:20 PM    [facility] [address], [place], [place], [address] Phone: [phone]   Fax:  [phone]  Name: Ellen Henry MRN: [id number] Date of Birth: 10/15/1946

## 2018-03-17 ENCOUNTER — Ambulatory Visit (INDEPENDENT_AMBULATORY_CARE_PROVIDER_SITE_OTHER): Payer: Medicare Other | Admitting: Orthopaedic Surgery

## 2018-03-17 ENCOUNTER — Encounter: Payer: Self-pay | Admitting: Physical Therapy

## 2018-03-17 ENCOUNTER — Encounter (INDEPENDENT_AMBULATORY_CARE_PROVIDER_SITE_OTHER): Payer: Self-pay | Admitting: Orthopaedic Surgery

## 2018-03-17 VITALS — BP 113/84 | HR 97 | Resp 18 | Ht 60.0 in | Wt 244.0 lb

## 2018-03-17 DIAGNOSIS — G8929 Other chronic pain: Secondary | ICD-10-CM

## 2018-03-17 DIAGNOSIS — M25511 Pain in right shoulder: Secondary | ICD-10-CM

## 2018-03-17 MED ORDER — TRAMADOL HCL 50 MG PO TABS: 50.0000 mg | ORAL_TABLET | Freq: Three times a day (TID) | ORAL | 0 refills | Status: DC | PRN

## 2018-03-17 NOTE — Progress Notes (Signed)
Office Visit Note   Patient: Ellen Henry           Date of Birth: 1946-08-25           MRN: [id number] Visit Date: 03/17/2018              Requested by: Lucianne Lei, MD [address] [place], [place] [address] PCP: Lucianne Lei, MD   Assessment & Plan: Visit Diagnoses:  1. Chronic right shoulder pain     Plan: Mrs. Cagley is 2 months status post rotator cuff tear repair right shoulder.  She relates that she is definitely better than she was before surgery.  Still going to physical therapy.  Somewhat sore and weak but overall happy with her progress.  Will continue with physical therapy for strengthening and active range of motion, renew tramadol and see in 1 month  Follow-Up Instructions: Return in about 1 month (around 04/16/2018).   Orders:  No orders of the defined types were placed in this encounter.  Meds ordered this encounter  Medications  . traMADol (ULTRAM) 50 MG tablet    Sig: Take 1 tablet (50 mg total) by mouth 3 (three) times daily as needed.    Dispense:  30 tablet    Refill:  0      Procedures: No procedures performed   Clinical Data: No additional findings.   Subjective: Chief Complaint  Patient presents with  . Right Shoulder - Follow-up  . Post-op Follow-up    r shoulder surgery 01/18/18 doing PT 3 WEEKS  2 months status post arthroscopic SCD, DCR and mini open rotator cuff tear repair.  Doing well.  Relates she is feeling better than she did before surgery.  Still going to physical therapy.  Not using a sling.  HPI  Review of Systems  Constitutional: Positive for fatigue.  HENT: Negative for ear pain.   Eyes: Negative for pain.  Respiratory: Positive for cough. Negative for shortness of breath.   Cardiovascular: Positive for leg swelling.  Gastrointestinal: Positive for constipation. Negative for diarrhea.  Genitourinary: Negative for difficulty urinating.  Musculoskeletal: Positive for back pain. Negative for neck pain.  Skin:  Negative for rash.  Allergic/Immunologic: Positive for food allergies.  Hematological: Bruises/bleeds easily.  Psychiatric/Behavioral: Negative for sleep disturbance.     Objective: Vital Signs: BP 113/84 (BP Location: Left Arm, Patient Position: Sitting, Cuff Size: Normal)   Pulse 97   Resp 18   Ht 5' (1.524 m)   Wt 244 lb (110.7 kg)   BMI 47.65 kg/m   Physical Exam  Ortho Exam awake alert and oriented x3.  Comfortable sitting.  I was able abduct arm 90 degrees and flex almost fully overhead with a little discomfort at the extremes of flexion negative impingement.  Good grip and good release.  Has developed some hypertrophy about her scars.  She will rub mederma or aloe vera on the scars.  Specialty Comments:  No specialty comments available.  Imaging: No results found.   PMFS History: Patient Active Problem List   Diagnosis Date Noted  . Complete tear of right rotator cuff 01/18/2018  . Impingement syndrome of right shoulder 01/18/2018  . AC (acromioclavicular) arthritis 01/18/2018  . Rotator cuff tear 01/18/2018  . Low oxygen saturation 12/09/2017  . Chronic constipation   . Failed back syndrome   . Chronic pain syndrome   . Radicular pain   . Chronic bilateral low back pain without sciatica   . Dysphagia, post-stroke   .  Left hemiparesis ([place])   . Acute ischemic stroke ([place]) 12/15/2016  . Urinary retention 02/25/2015  . Primary osteoarthritis of left knee 02/15/2015  . Morbid obesity ([place]) 02/15/2015  . Essential hypertension, benign 09/07/2014  . Hyperlipidemia 09/05/2014  . Gout 09/05/2014  . Neuropathy 09/05/2014  . Radiculopathy 08/29/2014   Past Medical History:  Diagnosis Date  . Arthritis   . Complication of anesthesia    afer nerve block difficult breathing (at [place] [address] transported to [facility])  . Dry skin   . GERD (gastroesophageal reflux disease)    "years ago", diet controlled  . Gout   . H/O pyelonephritis    as a child    . 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 Hypertension   . Impingement syndrome of right shoulder   . Neuropathy   . Osteoarthritis of AC (acromioclavicular) joint    Right  . Pneumonia   . Radiculopathy   . Rotator cuff tear, right   . Sleep apnea    does not use cpap  . Stroke ([place]) 12/15/2016   left side weakness    Family History  Problem Relation Age of Onset  . Varicose Veins Mother     Past Surgical History:  Procedure Laterality Date  . ABDOMINAL EXPOSURE N/A 08/29/2014   Procedure: ABDOMINAL EXPOSURE;  Surgeon: Rosetta Posner, MD;  Location: [place] [place];  Service: Vascular;  Laterality: N/A;  . ABDOMINAL HYSTERECTOMY    . ANTERIOR LUMBAR FUSION N/A 08/29/2014   Procedure: ANTERIOR LUMBAR FUSION 1 LEVEL;  Surgeon: Sinclair Ship, MD;  Location: [place];  Service: Orthopedics;  Laterality: N/A;  Lumbar 4-5 anterior lumbar interbody fusion with allograft and instrumentation.  . APPENDECTOMY    . BACK SURGERY  08/28/2014 and 08/29/2014   lumbar fusion  . CARPAL TUNNEL RELEASE Right    release done twice  . COLONOSCOPY    . JOINT REPLACEMENT    . left knee replacement    . REPLACEMENT TOTAL KNEE  2010   rigth knee  . SHOULDER ARTHROSCOPY Right 01/18/2018   Procedure: RIGHT SHOULDER ARTHROSCOPY, SUBACROMIAL DECOMPRESSION, DISTAL CLAVICLE RESECTION WITH MINI OPEN ROTATOR CUFF REPAIR;  Surgeon: Garald Balding, MD;  Location: [place];  Service: Orthopedics;  Laterality: Right;  . TONSILLECTOMY    . TOTAL KNEE ARTHROPLASTY Left 02/15/2015   Procedure: TOTAL KNEE ARTHROPLASTY;  Surgeon: [name], MD;  Location: [place];  Service: Orthopedics;  Laterality: Left;   Social History   Occupational History  . Not on file  Tobacco Use  . Smoking status: Current Some Day Smoker    Packs/day: 0.50    Types: Cigarettes  . Smokeless tobacco: Never Used  Substance and Sexual Activity  . Alcohol use: No  . Drug use: No    Comment: sometimes will take a vicodin from  friend  . Sexual activity: Never

## 2018-03-19 DIAGNOSIS — I10 Essential (primary) hypertension: Secondary | ICD-10-CM | POA: Diagnosis not present

## 2018-03-19 DIAGNOSIS — E118 Type 2 diabetes mellitus with unspecified complications: Secondary | ICD-10-CM | POA: Diagnosis not present

## 2018-03-19 DIAGNOSIS — J9801 Acute bronchospasm: Secondary | ICD-10-CM | POA: Diagnosis not present

## 2018-03-19 DIAGNOSIS — I639 Cerebral infarction, unspecified: Secondary | ICD-10-CM | POA: Diagnosis not present

## 2018-03-22 ENCOUNTER — Encounter: Payer: Self-pay | Admitting: Physical Therapy

## 2018-03-22 ENCOUNTER — Ambulatory Visit: Payer: Medicare Other | Admitting: Physical Therapy

## 2018-03-22 DIAGNOSIS — M25511 Pain in right shoulder: Secondary | ICD-10-CM | POA: Diagnosis not present

## 2018-03-22 DIAGNOSIS — M25611 Stiffness of right shoulder, not elsewhere classified: Secondary | ICD-10-CM | POA: Diagnosis not present

## 2018-03-22 DIAGNOSIS — M6281 Muscle weakness (generalized): Secondary | ICD-10-CM

## 2018-03-22 NOTE — Patient Instructions (Signed)
Access Code: [email or web address]  URL: [email or web address]  Date: 03/22/2018  Prepared by: Faustino Congress   Exercises  Supine Chin Tuck - 10 reps - 1 sets - 5 sec hold - 2x daily - 7x weekly  Supine Scapular Retraction - 10 reps - 1 sets - 5 sec hold - 2x daily - 7x weekly  Standing Backward Shoulder Rolls - 10 reps - 1 sets - 2x daily - 7x weekly  Supine Shoulder Flexion AAROM with [place] - 15 reps - 1 sets - 2x daily - 7x weekly  Supine Shoulder External Rotation AAROM with [place] - 15 reps - 1 sets - 2x daily - 7x weekly  Shoulder Scaption AAROM with [place] - 15 reps - 1 sets - 2x daily - 7x weekly

## 2018-03-22 NOTE — Therapy (Signed)
[facility] [address] [place], [place], [address] Phone: [phone]   Fax:  [phone]  Physical Therapy Treatment  Patient Details  Name: Ellen Henry MRN: [id number] Date of Birth: 09-07-1946 No data recorded  Encounter Date: 03/22/2018  PT End of Session - 03/22/18 1222    Visit Number  4    Date for PT Re-Evaluation  04/26/18    PT Start Time  1144    PT Stop Time  1223    PT Time Calculation (min)  39 min    Activity Tolerance  Patient tolerated treatment well    Behavior During Therapy  [facility] for tasks assessed/performed       Past Medical History:  Diagnosis Date  . Arthritis   . Complication of anesthesia    afer nerve block difficult breathing (at [place] [address] transported to [facility])  . Dry skin   . GERD (gastroesophageal reflux disease)    "years ago", diet controlled  . Gout   . H/O pyelonephritis    as a child  . 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 Hypertension   . Impingement syndrome of right shoulder   . Neuropathy   . Osteoarthritis of AC (acromioclavicular) joint    Right  . Pneumonia   . Radiculopathy   . Rotator cuff tear, right   . Sleep apnea    does not use cpap  . Stroke ([place]) 12/15/2016   left side weakness    Past Surgical History:  Procedure Laterality Date  . ABDOMINAL EXPOSURE N/A 08/29/2014   Procedure: ABDOMINAL EXPOSURE;  Surgeon: Rosetta Posner, MD;  Location: [place];  Service: Vascular;  Laterality: N/A;  . ABDOMINAL HYSTERECTOMY    . ANTERIOR LUMBAR FUSION N/A 08/29/2014   Procedure: ANTERIOR LUMBAR FUSION 1 LEVEL;  Surgeon: Sinclair Ship, MD;  Location: [place];  Service: Orthopedics;  Laterality: N/A;  Lumbar 4-5 anterior lumbar interbody fusion with allograft and instrumentation.  . APPENDECTOMY    . BACK SURGERY  08/28/2014 and 08/29/2014   lumbar fusion  . CARPAL TUNNEL RELEASE Right    release done twice  .  COLONOSCOPY    . JOINT REPLACEMENT    . left knee replacement    . REPLACEMENT TOTAL KNEE  2010   rigth knee  . SHOULDER ARTHROSCOPY Right 01/18/2018   Procedure: RIGHT SHOULDER ARTHROSCOPY, SUBACROMIAL DECOMPRESSION, DISTAL CLAVICLE RESECTION WITH MINI OPEN ROTATOR CUFF REPAIR;  Surgeon: Garald Balding, MD;  Location: [place];  Service: Orthopedics;  Laterality: Right;  . TONSILLECTOMY    . TOTAL KNEE ARTHROPLASTY Left 02/15/2015   Procedure: TOTAL KNEE ARTHROPLASTY;  Surgeon: [name], MD;  Location: [place];  Service: Orthopedics;  Laterality: Left;    There were no vitals filed for this visit.  Subjective Assessment - 03/22/18 1146    Subjective  saw MD since last appt; pleased with ROM and has new order for AROM and strengthening    Patient Stated Goals  strength and pain management    Currently in Pain?  Yes    Pain Score  3     Pain Location  Shoulder    Pain Orientation  Right    Pain Descriptors / Indicators  Sharp    Pain Type  Surgical pain    Pain Onset  More than a month ago    Pain Frequency  Intermittent    Aggravating Factors  movement    Pain Relieving Factors  pain medication, tylenol                       [facility] Adult PT Treatment/Exercise - 03/22/18 1147      Shoulder Exercises: Supine   External Rotation  Right;15 reps cane    Flexion  Both;15 reps cane      Shoulder Exercises: Standing   External Rotation  Right;10 reps;Theraband    Theraband Level (Shoulder External Rotation)  Level 1 (Yellow)    Internal Rotation  Right;10 reps;Theraband    Theraband Level (Shoulder Internal Rotation)  Level 1 (Yellow)    ABduction  Right;15 reps cane    Row  Both;15 reps;Theraband    Theraband Level (Shoulder Row)  Level 1 (Yellow)      Shoulder Exercises: Pulleys   Flexion  3 minutes    Scaption  3 minutes      Manual Therapy   Passive ROM  Rt shoulder with gentle GH distraction all directions within protocol limitations              PT Education - 03/22/18 1222    Education provided  Yes    Education Details  cane exercises    Person(s) Educated  Patient    Methods  Explanation;Demonstration;Handout    Comprehension  Verbalized understanding;Returned demonstration;Need further instruction       PT Short Term Goals - 03/01/18 1754      PT SHORT TERM GOAL #1   Title  patient will be ind with initial HEP    Baseline  ...    Time  4    Period  Weeks    Status  New    Target Date  03/29/18      PT SHORT TERM GOAL #2   Title  Pt will have full painfree PROM Rt shoulder    Baseline  .Marland Kitchen    Time  4    Period  Weeks    Status  New    Target Date  03/29/18      PT SHORT TERM GOAL #3   Title  ...    Baseline  ...      PT SHORT TERM GOAL #4   Title  ...    Baseline  ...        PT Long Term Goals - 03/01/18 1756      PT LONG TERM GOAL #1   Title  ind with advanced HEP    Time  8    Period  Weeks    Status  New    Target Date  04/26/18      PT LONG TERM GOAL #2   Title  FOTO <or = to 38% limited    Time  8    Period  Weeks    Status  New    Target Date  04/26/18      PT LONG TERM GOAL #3   Title  Patient will be able to return to performing household chores such as laundry without increased pain    Time  8    Period  Weeks    Status  New    Target Date  04/26/18      PT LONG TERM GOAL #4   Title  Pt will have > or = to 140 degrees AROM right shoulder flexion and abduction    Time  8    Period  Weeks    Status  New    Target Date  04/26/18      PT LONG TERM GOAL #5   Title  Pt will demonstrate 4+/5 MMT right shoulder flexion and abcution for return to functional reaching activities    Time  8    Period  Weeks    Status  New    Target Date  04/26/18            Plan - 03/22/18 1223    Clinical Impression Statement  Pt arrived today with new script from MD for AROM and strengthening exercises.  Initiated AAROM activities today which pt tolerated well.  Gentle  strengthening and ROM activities progressed today.  Pt declined modalities at end of session.  Progressing well with PT.    Rehab Potential  Excellent    PT Frequency  2x / week    PT Duration  8 weeks    PT Treatment/Interventions  ADLs/Self Care Home Management;Cryotherapy;Electrical Stimulation;Moist Heat;Therapeutic activities;Therapeutic exercise;Neuromuscular re-education;Patient/family education;Manual techniques;Taping;Dry needling;Passive range of motion    PT Next Visit Plan  AA/AROM and strengthening, manual for ROM, posture and scap squeezes    PT Home Exercise Plan  progress as needed    Consulted and Agree with Plan of Care  Patient       Patient will benefit from skilled therapeutic intervention in order to improve the following deficits and impairments:  Impaired UE functional use, Pain, Decreased strength, Impaired flexibility, Postural dysfunction, Increased muscle spasms, Increased fascial restricitons  Visit Diagnosis: Acute pain of right shoulder  Muscle weakness (generalized)  Stiffness of right shoulder, not elsewhere classified     Problem List Patient Active Problem List   Diagnosis Date Noted  . Complete tear of right rotator cuff 01/18/2018  . Impingement syndrome of right shoulder 01/18/2018  . AC (acromioclavicular) arthritis 01/18/2018  . Rotator cuff tear 01/18/2018  . Low oxygen saturation 12/09/2017  . Chronic constipation   . Failed back syndrome   . Chronic pain syndrome   . Radicular pain   . Chronic bilateral low back pain without sciatica   . Dysphagia, post-stroke   . Left hemiparesis ([place])   . Acute ischemic stroke ([place]) 12/15/2016  . Urinary retention 02/25/2015  . Primary osteoarthritis of left knee 02/15/2015  . Morbid obesity ([place]) 02/15/2015  . Essential hypertension, benign 09/07/2014  . Hyperlipidemia 09/05/2014  . Gout 09/05/2014  . Neuropathy 09/05/2014  . Radiculopathy 08/29/2014      Laureen Abrahams, PT,  DPT 03/22/18 12:25 PM    [facility] [address], [place], [place], [address] Phone: [phone]   Fax:  [phone]  Name: Ellen Henry MRN: [id number] Date of Birth: [birth year]

## 2018-03-24 ENCOUNTER — Encounter: Payer: Self-pay | Admitting: Physical Therapy

## 2018-03-29 ENCOUNTER — Ambulatory Visit: Payer: Medicare Other | Admitting: Physical Therapy

## 2018-03-29 ENCOUNTER — Encounter: Payer: Self-pay | Admitting: Physical Therapy

## 2018-03-29 DIAGNOSIS — M25511 Pain in right shoulder: Secondary | ICD-10-CM | POA: Diagnosis not present

## 2018-03-29 DIAGNOSIS — M25611 Stiffness of right shoulder, not elsewhere classified: Secondary | ICD-10-CM | POA: Diagnosis not present

## 2018-03-29 DIAGNOSIS — M6281 Muscle weakness (generalized): Secondary | ICD-10-CM | POA: Diagnosis not present

## 2018-03-29 NOTE — Therapy (Addendum)
[facility] [address], [place], [place], [address] Phone: [phone]   Fax:  [phone]  Physical Therapy Treatment  Patient Details  Name: Ellen Henry MRN: [id number] Date of Birth: 1946/02/28 No data recorded  Encounter Date: 03/29/2018  PT End of Session - 03/29/18 1224    Visit Number  5    Date for PT Re-Evaluation  04/26/18    PT Start Time  1145    PT Stop Time  1225    PT Time Calculation (min)  40 min    Activity Tolerance  Patient tolerated treatment well    Behavior During Therapy  [facility] for tasks assessed/performed       Past Medical History:  Diagnosis Date  . Arthritis   . Complication of anesthesia    afer nerve block difficult breathing (at [place] [address] transported to [facility])  . Dry skin   . GERD (gastroesophageal reflux disease)    "years ago", diet controlled  . Gout   . H/O pyelonephritis    as a child  . 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 Hypertension   . Impingement syndrome of right shoulder   . Neuropathy   . Osteoarthritis of AC (acromioclavicular) joint    Right  . Pneumonia   . Radiculopathy   . Rotator cuff tear, right   . Sleep apnea    does not use cpap  . Stroke ([place]) 12/15/2016   left side weakness    Past Surgical History:  Procedure Laterality Date  . ABDOMINAL EXPOSURE N/A 08/29/2014   Procedure: ABDOMINAL EXPOSURE;  Surgeon: Rosetta Posner, MD;  Location: [place];  Service: Vascular;  Laterality: N/A;  . ABDOMINAL HYSTERECTOMY    . ANTERIOR LUMBAR FUSION N/A 08/29/2014   Procedure: ANTERIOR LUMBAR FUSION 1 LEVEL;  Surgeon: Sinclair Ship, MD;  Location: [place];  Service: Orthopedics;  Laterality: N/A;  Lumbar 4-5 anterior lumbar interbody fusion with allograft and instrumentation.  . APPENDECTOMY    . BACK SURGERY  08/28/2014 and 08/29/2014   lumbar fusion  . CARPAL TUNNEL RELEASE Right    release done twice  .  COLONOSCOPY    . JOINT REPLACEMENT    . left knee replacement    . REPLACEMENT TOTAL KNEE  2010   rigth knee  . SHOULDER ARTHROSCOPY Right 01/18/2018   Procedure: RIGHT SHOULDER ARTHROSCOPY, SUBACROMIAL DECOMPRESSION, DISTAL CLAVICLE RESECTION WITH MINI OPEN ROTATOR CUFF REPAIR;  Surgeon: Garald Balding, MD;  Location: [place];  Service: Orthopedics;  Laterality: Right;  . TONSILLECTOMY    . TOTAL KNEE ARTHROPLASTY Left 02/15/2015   Procedure: TOTAL KNEE ARTHROPLASTY;  Surgeon: [name], MD;  Location: [place];  Service: Orthopedics;  Laterality: Left;    There were no vitals filed for this visit.  Subjective Assessment - 03/29/18 1142    Subjective  Rt shoulder is sore, but feels she's doing more with her arm.    Patient Stated Goals  strength and pain management    Currently in Pain?  Yes    Pain Score  3     Pain Location  Shoulder    Pain Orientation  Right    Pain Descriptors / Indicators  Aching    Pain Type  Surgical pain    Pain Onset  More than a month ago    Pain Frequency  Intermittent    Aggravating Factors   movement  Pain Relieving Factors  pain medication, tylenol         [facility] PT Assessment - 03/29/18 1152      ROM / Strength   AROM / PROM / Strength  AROM      AROM   AROM Assessment Site  Shoulder    Right/Left Shoulder  Right    Right Shoulder Flexion  133 Degrees    Right Shoulder ABduction  114 Degrees    Right Shoulder Internal Rotation  54 Degrees    Right Shoulder External Rotation  60 Degrees      PROM   Right Shoulder Flexion  156 Degrees    Right Shoulder ABduction  117 Degrees    Right Shoulder Internal Rotation  70 Degrees    Right Shoulder External Rotation  60 Degrees                   [facility] Adult PT Treatment/Exercise - 03/29/18 1146      Shoulder Exercises: Supine   External Rotation  Right;15 reps cane    Flexion  Both;15 reps cane      Shoulder Exercises: Standing   ABduction  Right;15 reps cane    Row   Both;15 reps;Theraband    Theraband Level (Shoulder Row)  Level 1 (Yellow)    Other Standing Exercises  wall ladder flexion and scaption x 10 bil      Shoulder Exercises: Pulleys   Flexion  3 minutes    Scaption  3 minutes      Manual Therapy   Passive ROM  Rt shoulder with gentle GH distraction all directions within protocol limitations               PT Short Term Goals - 03/29/18 1225      PT SHORT TERM GOAL #1   Title  patient will be ind with initial HEP    Baseline  4/23: min cues needed with new HEP    Time  4    Period  Weeks    Status  Partially Met      PT SHORT TERM GOAL #2   Title  Pt will have full painfree PROM Rt shoulder    Baseline  .Marland Kitchen    Time  4    Period  Weeks    Status  Partially Met      PT SHORT TERM GOAL #3   Title  ...    Baseline  ...      PT SHORT TERM GOAL #4   Title  ...    Baseline  ...        PT Long Term Goals - 03/01/18 1756      PT LONG TERM GOAL #1   Title  ind with advanced HEP    Time  8    Period  Weeks    Status  New    Target Date  04/26/18      PT LONG TERM GOAL #2   Title  FOTO <or = to 38% limited    Time  8    Period  Weeks    Status  New    Target Date  04/26/18      PT LONG TERM GOAL #3   Title  Patient will be able to return to performing household chores such as laundry without increased pain    Time  8    Period  Weeks    Status  New    Target Date  04/26/18  PT LONG TERM GOAL #4   Title  Pt will have > or = to 140 degrees AROM right shoulder flexion and abduction    Time  8    Period  Weeks    Status  New    Target Date  04/26/18      PT LONG TERM GOAL #5   Title  Pt will demonstrate 4+/5 MMT right shoulder flexion and abcution for return to functional reaching activities    Time  8    Period  Weeks    Status  New    Target Date  04/26/18            Plan - 03/29/18 1225    Clinical Impression Statement  Pt has partially met STGs with PROM significantly improved, but not  quite full ROM.  Overall progressing well with PT and now able to tolerate more AROM and strengthening exercises.  Mod cues needed with new HEP as pt reports she hasn't been compliant.    Rehab Potential  Excellent    PT Frequency  2x / week    PT Duration  8 weeks    PT Treatment/Interventions  ADLs/Self Care Home Management;Cryotherapy;Electrical Stimulation;Moist Heat;Therapeutic activities;Therapeutic exercise;Neuromuscular re-education;Patient/family education;Manual techniques;Taping;Dry needling;Passive range of motion    PT Next Visit Plan  AA/AROM and strengthening, manual for ROM, posture and scap squeezes    PT Home Exercise Plan  progress as needed    Consulted and Agree with Plan of Care  Patient       Patient will benefit from skilled therapeutic intervention in order to improve the following deficits and impairments:  Impaired UE functional use, Pain, Decreased strength, Impaired flexibility, Postural dysfunction, Increased muscle spasms, Increased fascial restricitons  Visit Diagnosis: Acute pain of right shoulder  Muscle weakness (generalized)  Stiffness of right shoulder, not elsewhere classified     Problem List Patient Active Problem List   Diagnosis Date Noted  . Complete tear of right rotator cuff 01/18/2018  . Impingement syndrome of right shoulder 01/18/2018  . AC (acromioclavicular) arthritis 01/18/2018  . Rotator cuff tear 01/18/2018  . Low oxygen saturation 12/09/2017  . Chronic constipation   . Failed back syndrome   . Chronic pain syndrome   . Radicular pain   . Chronic bilateral low back pain without sciatica   . Dysphagia, post-stroke   . Left hemiparesis ([place])   . Acute ischemic stroke ([place]) 12/15/2016  . Urinary retention 02/25/2015  . Primary osteoarthritis of left knee 02/15/2015  . Morbid obesity ([place]) 02/15/2015  . Essential hypertension, benign 09/07/2014  . Hyperlipidemia 09/05/2014  . Gout 09/05/2014  . Neuropathy 09/05/2014  .  Radiculopathy 08/29/2014      Laureen Abrahams, PT, DPT 03/29/18 12:27 PM     [facility] [address] [place], [place], [address] Phone: [phone]   Fax:  [phone]  Name: Ellen Henry MRN: [id number] Date of Birth: 05/02/1946  PHYSICAL THERAPY DISCHARGE SUMMARY  Visits from Start of Care: 5  Current functional level related to goals / functional outcomes: See above remaining   Remaining deficits: See above remaining   Education / Equipment: HEP  Plan: Patient agrees to discharge.  Patient goals were not met. Patient is being discharged due to not returning since the last visit.  ?????     [employer], PT 06/20/18 7:59 AM

## 2018-03-31 ENCOUNTER — Encounter: Payer: Self-pay | Admitting: Physical Therapy

## 2018-04-18 ENCOUNTER — Ambulatory Visit (INDEPENDENT_AMBULATORY_CARE_PROVIDER_SITE_OTHER): Payer: Self-pay | Admitting: Orthopaedic Surgery

## 2018-04-29 ENCOUNTER — Ambulatory Visit (INDEPENDENT_AMBULATORY_CARE_PROVIDER_SITE_OTHER): Payer: Self-pay | Admitting: Orthopaedic Surgery

## 2018-05-19 DIAGNOSIS — M1010 Lead-induced gout, unspecified site: Secondary | ICD-10-CM | POA: Diagnosis not present

## 2018-05-19 DIAGNOSIS — R7309 Other abnormal glucose: Secondary | ICD-10-CM | POA: Diagnosis not present

## 2018-05-19 DIAGNOSIS — I6319 Cerebral infarction due to embolism of other precerebral artery: Secondary | ICD-10-CM | POA: Diagnosis not present

## 2018-05-19 DIAGNOSIS — I10 Essential (primary) hypertension: Secondary | ICD-10-CM | POA: Diagnosis not present

## 2018-05-19 DIAGNOSIS — Z0000 Encounter for general adult medical examination without abnormal findings: Secondary | ICD-10-CM | POA: Diagnosis not present

## 2018-06-10 ENCOUNTER — Ambulatory Visit (INDEPENDENT_AMBULATORY_CARE_PROVIDER_SITE_OTHER): Payer: Self-pay | Admitting: Orthopaedic Surgery

## 2018-06-21 NOTE — Progress Notes (Signed)
I agree with the above plan 

## 2018-06-26 DIAGNOSIS — E119 Type 2 diabetes mellitus without complications: Secondary | ICD-10-CM | POA: Diagnosis not present

## 2018-06-26 DIAGNOSIS — Z0100 Encounter for examination of eyes and vision without abnormal findings: Secondary | ICD-10-CM | POA: Diagnosis not present

## 2018-07-19 DIAGNOSIS — M1250 Traumatic arthropathy, unspecified site: Secondary | ICD-10-CM | POA: Diagnosis not present

## 2018-07-19 DIAGNOSIS — I10 Essential (primary) hypertension: Secondary | ICD-10-CM | POA: Diagnosis not present

## 2018-07-19 DIAGNOSIS — I6319 Cerebral infarction due to embolism of other precerebral artery: Secondary | ICD-10-CM | POA: Diagnosis not present

## 2018-07-25 DIAGNOSIS — Z1231 Encounter for screening mammogram for malignant neoplasm of breast: Secondary | ICD-10-CM | POA: Diagnosis not present

## 2018-11-23 DIAGNOSIS — I10 Essential (primary) hypertension: Secondary | ICD-10-CM | POA: Diagnosis not present

## 2018-11-23 DIAGNOSIS — R7309 Other abnormal glucose: Secondary | ICD-10-CM | POA: Diagnosis not present

## 2018-11-23 DIAGNOSIS — I6319 Cerebral infarction due to embolism of other precerebral artery: Secondary | ICD-10-CM | POA: Diagnosis not present

## 2018-11-23 DIAGNOSIS — M130 Polyarthritis, unspecified: Secondary | ICD-10-CM | POA: Diagnosis not present

## 2019-01-24 DIAGNOSIS — R7309 Other abnormal glucose: Secondary | ICD-10-CM | POA: Diagnosis not present

## 2019-01-24 DIAGNOSIS — I10 Essential (primary) hypertension: Secondary | ICD-10-CM | POA: Diagnosis not present

## 2019-01-24 DIAGNOSIS — I639 Cerebral infarction, unspecified: Secondary | ICD-10-CM | POA: Diagnosis not present

## 2019-01-24 DIAGNOSIS — J441 Chronic obstructive pulmonary disease with (acute) exacerbation: Secondary | ICD-10-CM | POA: Diagnosis not present

## 2019-04-12 DIAGNOSIS — I10 Essential (primary) hypertension: Secondary | ICD-10-CM | POA: Diagnosis not present

## 2019-04-12 DIAGNOSIS — L0390 Cellulitis, unspecified: Secondary | ICD-10-CM | POA: Diagnosis not present

## 2019-04-12 DIAGNOSIS — B070 Plantar wart: Secondary | ICD-10-CM | POA: Diagnosis not present

## 2019-04-13 DIAGNOSIS — M2042 Other hammer toe(s) (acquired), left foot: Secondary | ICD-10-CM | POA: Diagnosis not present

## 2019-04-13 DIAGNOSIS — D2371 Other benign neoplasm of skin of right lower limb, including hip: Secondary | ICD-10-CM | POA: Diagnosis not present

## 2019-04-13 DIAGNOSIS — M2041 Other hammer toe(s) (acquired), right foot: Secondary | ICD-10-CM | POA: Diagnosis not present

## 2019-04-13 DIAGNOSIS — I10 Essential (primary) hypertension: Secondary | ICD-10-CM | POA: Diagnosis not present

## 2019-04-13 DIAGNOSIS — D2372 Other benign neoplasm of skin of left lower limb, including hip: Secondary | ICD-10-CM | POA: Diagnosis not present

## 2019-04-13 DIAGNOSIS — L0390 Cellulitis, unspecified: Secondary | ICD-10-CM | POA: Diagnosis not present

## 2019-04-13 DIAGNOSIS — B070 Plantar wart: Secondary | ICD-10-CM | POA: Diagnosis not present

## 2019-04-17 DIAGNOSIS — R600 Localized edema: Secondary | ICD-10-CM | POA: Diagnosis not present

## 2019-04-17 DIAGNOSIS — I10 Essential (primary) hypertension: Secondary | ICD-10-CM | POA: Diagnosis not present

## 2019-04-17 DIAGNOSIS — R7309 Other abnormal glucose: Secondary | ICD-10-CM | POA: Diagnosis not present

## 2019-04-17 DIAGNOSIS — B070 Plantar wart: Secondary | ICD-10-CM | POA: Diagnosis not present

## 2019-05-08 DIAGNOSIS — Z0000 Encounter for general adult medical examination without abnormal findings: Secondary | ICD-10-CM | POA: Diagnosis not present

## 2019-05-08 DIAGNOSIS — E785 Hyperlipidemia, unspecified: Secondary | ICD-10-CM | POA: Diagnosis not present

## 2019-05-08 DIAGNOSIS — I10 Essential (primary) hypertension: Secondary | ICD-10-CM | POA: Diagnosis not present

## 2019-05-08 DIAGNOSIS — R7309 Other abnormal glucose: Secondary | ICD-10-CM | POA: Diagnosis not present

## 2019-06-15 DIAGNOSIS — D2372 Other benign neoplasm of skin of left lower limb, including hip: Secondary | ICD-10-CM | POA: Diagnosis not present

## 2019-06-15 DIAGNOSIS — D2371 Other benign neoplasm of skin of right lower limb, including hip: Secondary | ICD-10-CM | POA: Diagnosis not present

## 2019-06-19 DIAGNOSIS — M130 Polyarthritis, unspecified: Secondary | ICD-10-CM | POA: Diagnosis not present

## 2019-06-19 DIAGNOSIS — I6319 Cerebral infarction due to embolism of other precerebral artery: Secondary | ICD-10-CM | POA: Diagnosis not present

## 2019-06-19 DIAGNOSIS — I10 Essential (primary) hypertension: Secondary | ICD-10-CM | POA: Diagnosis not present

## 2019-07-03 ENCOUNTER — Other Ambulatory Visit: Payer: Self-pay

## 2019-07-03 NOTE — Patient Outreach (Signed)
[facility]) Care Management  07/03/2019  Ellen Henry 03-Mar-1946 [id number]   Medication Adherence call to Ellen Henry Telephone call to Patient regarding Medication Adherence unable to reach patient. Ellen Henry is showing past due on Telmisartan/Hctz 40/12.5 mg under [facility].   [facility] Management Direct Dial [phone]  Fax [phone] [email or web address]

## 2019-07-05 DIAGNOSIS — D2372 Other benign neoplasm of skin of left lower limb, including hip: Secondary | ICD-10-CM | POA: Diagnosis not present

## 2019-07-05 DIAGNOSIS — M898X7 Other specified disorders of bone, ankle and foot: Secondary | ICD-10-CM | POA: Diagnosis not present

## 2019-07-05 DIAGNOSIS — M205X1 Other deformities of toe(s) (acquired), right foot: Secondary | ICD-10-CM | POA: Diagnosis not present

## 2019-07-05 DIAGNOSIS — M79674 Pain in right toe(s): Secondary | ICD-10-CM | POA: Diagnosis not present

## 2019-07-19 DIAGNOSIS — M205X1 Other deformities of toe(s) (acquired), right foot: Secondary | ICD-10-CM | POA: Diagnosis not present

## 2019-07-19 DIAGNOSIS — M79674 Pain in right toe(s): Secondary | ICD-10-CM | POA: Diagnosis not present

## 2019-07-19 DIAGNOSIS — M898X7 Other specified disorders of bone, ankle and foot: Secondary | ICD-10-CM | POA: Diagnosis not present

## 2019-07-24 DIAGNOSIS — M205X1 Other deformities of toe(s) (acquired), right foot: Secondary | ICD-10-CM | POA: Diagnosis not present

## 2019-07-24 DIAGNOSIS — M79674 Pain in right toe(s): Secondary | ICD-10-CM | POA: Diagnosis not present

## 2019-07-24 DIAGNOSIS — M898X7 Other specified disorders of bone, ankle and foot: Secondary | ICD-10-CM | POA: Diagnosis not present

## 2019-07-25 DIAGNOSIS — G8929 Other chronic pain: Secondary | ICD-10-CM | POA: Diagnosis not present

## 2019-07-25 DIAGNOSIS — Z01818 Encounter for other preprocedural examination: Secondary | ICD-10-CM | POA: Diagnosis not present

## 2019-07-28 DIAGNOSIS — Z1231 Encounter for screening mammogram for malignant neoplasm of breast: Secondary | ICD-10-CM | POA: Diagnosis not present

## 2019-07-31 DIAGNOSIS — M898X7 Other specified disorders of bone, ankle and foot: Secondary | ICD-10-CM | POA: Diagnosis not present

## 2019-07-31 DIAGNOSIS — M205X1 Other deformities of toe(s) (acquired), right foot: Secondary | ICD-10-CM | POA: Diagnosis not present

## 2019-08-08 DIAGNOSIS — M205X1 Other deformities of toe(s) (acquired), right foot: Secondary | ICD-10-CM | POA: Diagnosis not present

## 2019-10-30 DIAGNOSIS — I10 Essential (primary) hypertension: Secondary | ICD-10-CM | POA: Diagnosis not present

## 2019-10-30 DIAGNOSIS — I639 Cerebral infarction, unspecified: Secondary | ICD-10-CM | POA: Diagnosis not present

## 2019-10-30 DIAGNOSIS — E785 Hyperlipidemia, unspecified: Secondary | ICD-10-CM | POA: Diagnosis not present

## 2019-10-30 DIAGNOSIS — R7309 Other abnormal glucose: Secondary | ICD-10-CM | POA: Diagnosis not present

## 2020-01-29 DIAGNOSIS — Z03818 Encounter for observation for suspected exposure to other biological agents ruled out: Secondary | ICD-10-CM | POA: Diagnosis not present

## 2020-01-30 DIAGNOSIS — I6319 Cerebral infarction due to embolism of other precerebral artery: Secondary | ICD-10-CM | POA: Diagnosis not present

## 2020-01-30 DIAGNOSIS — E785 Hyperlipidemia, unspecified: Secondary | ICD-10-CM | POA: Diagnosis not present

## 2020-01-30 DIAGNOSIS — M130 Polyarthritis, unspecified: Secondary | ICD-10-CM | POA: Diagnosis not present

## 2020-01-30 DIAGNOSIS — Z8673 Personal history of transient ischemic attack (TIA), and cerebral infarction without residual deficits: Secondary | ICD-10-CM | POA: Diagnosis not present

## 2020-03-02 ENCOUNTER — Ambulatory Visit: Payer: Medicare Other | Attending: Internal Medicine

## 2020-03-02 DIAGNOSIS — Z23 Encounter for immunization: Secondary | ICD-10-CM

## 2020-03-02 NOTE — Progress Notes (Signed)
   Covid-19 Vaccination Clinic  Name:  Ellen Henry    MRN: [id number] DOB: 1946-07-12  03/02/2020  [name] was observed post Covid-19 immunization for 15 minutes without incident. She was provided with Vaccine Information Sheet and instruction to access the V-Safe system.   [name] was instructed to call 911 with any severe reactions post vaccine: Marland Kitchen Difficulty breathing  . Swelling of face and throat  . A fast heartbeat  . A bad rash all over body  . Dizziness and weakness   Immunizations Administered    Name Date Dose VIS Date Route   Pfizer COVID-19 Vaccine 03/02/2020 10:46 AM 0.3 mL 11/17/2019 Intramuscular   Manufacturer: [place]   Lot: [id number]   [place]: [id number]

## 2020-03-26 ENCOUNTER — Ambulatory Visit: Payer: Medicare Other | Attending: Internal Medicine

## 2020-03-26 DIAGNOSIS — Z23 Encounter for immunization: Secondary | ICD-10-CM

## 2020-03-26 NOTE — Progress Notes (Signed)
   Covid-19 Vaccination Clinic  Name:  Ellen Henry    MRN: [id number] DOB: 03/17/1946  03/26/2020  Ellen Henry was observed post Covid-19 immunization for 15 minutes without incident. She was provided with Vaccine Information Sheet and instruction to access the V-Safe system.   Ellen Henry was instructed to call 911 with any severe reactions post vaccine: Marland Kitchen Difficulty breathing  . Swelling of face and throat  . A fast heartbeat  . A bad rash all over body  . Dizziness and weakness   Immunizations Administered    Name Date Dose VIS Date Route   Pfizer COVID-19 Vaccine 03/26/2020 10:13 AM 0.3 mL 01/31/2019 Intramuscular   Manufacturer: [place]   Lot: [id number]   [place]: [id number]

## 2020-03-29 DIAGNOSIS — I10 Essential (primary) hypertension: Secondary | ICD-10-CM | POA: Diagnosis not present

## 2020-04-15 DIAGNOSIS — S90852A Superficial foreign body, left foot, initial encounter: Secondary | ICD-10-CM | POA: Diagnosis not present

## 2020-05-06 DIAGNOSIS — I10 Essential (primary) hypertension: Secondary | ICD-10-CM | POA: Diagnosis not present

## 2020-05-06 DIAGNOSIS — M130 Polyarthritis, unspecified: Secondary | ICD-10-CM | POA: Diagnosis not present

## 2020-05-06 DIAGNOSIS — E785 Hyperlipidemia, unspecified: Secondary | ICD-10-CM | POA: Diagnosis not present

## 2020-05-06 DIAGNOSIS — Z720 Tobacco use: Secondary | ICD-10-CM | POA: Diagnosis not present

## 2020-06-05 DIAGNOSIS — M130 Polyarthritis, unspecified: Secondary | ICD-10-CM | POA: Diagnosis not present

## 2020-06-05 DIAGNOSIS — E785 Hyperlipidemia, unspecified: Secondary | ICD-10-CM | POA: Diagnosis not present

## 2020-06-05 DIAGNOSIS — I10 Essential (primary) hypertension: Secondary | ICD-10-CM | POA: Diagnosis not present

## 2020-06-05 DIAGNOSIS — Z720 Tobacco use: Secondary | ICD-10-CM | POA: Diagnosis not present

## 2020-07-05 DIAGNOSIS — Z720 Tobacco use: Secondary | ICD-10-CM | POA: Diagnosis not present

## 2020-07-05 DIAGNOSIS — I10 Essential (primary) hypertension: Secondary | ICD-10-CM | POA: Diagnosis not present

## 2020-07-05 DIAGNOSIS — M130 Polyarthritis, unspecified: Secondary | ICD-10-CM | POA: Diagnosis not present

## 2020-07-05 DIAGNOSIS — E785 Hyperlipidemia, unspecified: Secondary | ICD-10-CM | POA: Diagnosis not present

## 2020-07-16 DIAGNOSIS — R7309 Other abnormal glucose: Secondary | ICD-10-CM | POA: Diagnosis not present

## 2020-07-16 DIAGNOSIS — I10 Essential (primary) hypertension: Secondary | ICD-10-CM | POA: Diagnosis not present

## 2020-07-16 DIAGNOSIS — M130 Polyarthritis, unspecified: Secondary | ICD-10-CM | POA: Diagnosis not present

## 2020-07-16 DIAGNOSIS — E785 Hyperlipidemia, unspecified: Secondary | ICD-10-CM | POA: Diagnosis not present

## 2020-07-16 DIAGNOSIS — Z720 Tobacco use: Secondary | ICD-10-CM | POA: Diagnosis not present

## 2020-07-29 DIAGNOSIS — Z1231 Encounter for screening mammogram for malignant neoplasm of breast: Secondary | ICD-10-CM | POA: Diagnosis not present

## 2020-08-06 DIAGNOSIS — E785 Hyperlipidemia, unspecified: Secondary | ICD-10-CM | POA: Diagnosis not present

## 2020-08-06 DIAGNOSIS — Z720 Tobacco use: Secondary | ICD-10-CM | POA: Diagnosis not present

## 2020-08-06 DIAGNOSIS — I10 Essential (primary) hypertension: Secondary | ICD-10-CM | POA: Diagnosis not present

## 2020-08-06 DIAGNOSIS — M130 Polyarthritis, unspecified: Secondary | ICD-10-CM | POA: Diagnosis not present

## 2020-08-08 DIAGNOSIS — I10 Essential (primary) hypertension: Secondary | ICD-10-CM | POA: Diagnosis not present

## 2020-08-08 DIAGNOSIS — E785 Hyperlipidemia, unspecified: Secondary | ICD-10-CM | POA: Diagnosis not present

## 2020-09-05 DIAGNOSIS — E785 Hyperlipidemia, unspecified: Secondary | ICD-10-CM | POA: Diagnosis not present

## 2020-09-05 DIAGNOSIS — M130 Polyarthritis, unspecified: Secondary | ICD-10-CM | POA: Diagnosis not present

## 2020-09-05 DIAGNOSIS — I10 Essential (primary) hypertension: Secondary | ICD-10-CM | POA: Diagnosis not present

## 2020-09-05 DIAGNOSIS — Z720 Tobacco use: Secondary | ICD-10-CM | POA: Diagnosis not present

## 2020-10-05 DIAGNOSIS — E785 Hyperlipidemia, unspecified: Secondary | ICD-10-CM | POA: Diagnosis not present

## 2020-10-05 DIAGNOSIS — M130 Polyarthritis, unspecified: Secondary | ICD-10-CM | POA: Diagnosis not present

## 2020-10-05 DIAGNOSIS — I10 Essential (primary) hypertension: Secondary | ICD-10-CM | POA: Diagnosis not present

## 2020-10-05 DIAGNOSIS — Z720 Tobacco use: Secondary | ICD-10-CM | POA: Diagnosis not present

## 2020-10-06 DIAGNOSIS — M130 Polyarthritis, unspecified: Secondary | ICD-10-CM | POA: Diagnosis not present

## 2020-10-06 DIAGNOSIS — I10 Essential (primary) hypertension: Secondary | ICD-10-CM | POA: Diagnosis not present

## 2020-10-06 DIAGNOSIS — Z720 Tobacco use: Secondary | ICD-10-CM | POA: Diagnosis not present

## 2020-10-06 DIAGNOSIS — E785 Hyperlipidemia, unspecified: Secondary | ICD-10-CM | POA: Diagnosis not present

## 2020-10-08 DIAGNOSIS — H524 Presbyopia: Secondary | ICD-10-CM | POA: Diagnosis not present

## 2020-10-10 DIAGNOSIS — I10 Essential (primary) hypertension: Secondary | ICD-10-CM | POA: Diagnosis not present

## 2020-10-10 DIAGNOSIS — Z23 Encounter for immunization: Secondary | ICD-10-CM | POA: Diagnosis not present

## 2020-10-10 DIAGNOSIS — E7800 Pure hypercholesterolemia, unspecified: Secondary | ICD-10-CM | POA: Diagnosis not present

## 2020-11-05 DIAGNOSIS — M130 Polyarthritis, unspecified: Secondary | ICD-10-CM | POA: Diagnosis not present

## 2020-11-05 DIAGNOSIS — E785 Hyperlipidemia, unspecified: Secondary | ICD-10-CM | POA: Diagnosis not present

## 2020-11-05 DIAGNOSIS — I10 Essential (primary) hypertension: Secondary | ICD-10-CM | POA: Diagnosis not present

## 2020-11-05 DIAGNOSIS — Z720 Tobacco use: Secondary | ICD-10-CM | POA: Diagnosis not present

## 2020-12-06 DIAGNOSIS — I10 Essential (primary) hypertension: Secondary | ICD-10-CM | POA: Diagnosis not present

## 2020-12-06 DIAGNOSIS — Z720 Tobacco use: Secondary | ICD-10-CM | POA: Diagnosis not present

## 2020-12-06 DIAGNOSIS — M130 Polyarthritis, unspecified: Secondary | ICD-10-CM | POA: Diagnosis not present

## 2020-12-06 DIAGNOSIS — E785 Hyperlipidemia, unspecified: Secondary | ICD-10-CM | POA: Diagnosis not present

## 2021-01-04 DIAGNOSIS — Z720 Tobacco use: Secondary | ICD-10-CM | POA: Diagnosis not present

## 2021-01-04 DIAGNOSIS — E785 Hyperlipidemia, unspecified: Secondary | ICD-10-CM | POA: Diagnosis not present

## 2021-01-04 DIAGNOSIS — M130 Polyarthritis, unspecified: Secondary | ICD-10-CM | POA: Diagnosis not present

## 2021-01-04 DIAGNOSIS — I10 Essential (primary) hypertension: Secondary | ICD-10-CM | POA: Diagnosis not present

## 2021-02-03 DIAGNOSIS — M130 Polyarthritis, unspecified: Secondary | ICD-10-CM | POA: Diagnosis not present

## 2021-02-03 DIAGNOSIS — I10 Essential (primary) hypertension: Secondary | ICD-10-CM | POA: Diagnosis not present

## 2021-02-03 DIAGNOSIS — Z720 Tobacco use: Secondary | ICD-10-CM | POA: Diagnosis not present

## 2021-02-03 DIAGNOSIS — E785 Hyperlipidemia, unspecified: Secondary | ICD-10-CM | POA: Diagnosis not present

## 2021-02-07 DIAGNOSIS — I10 Essential (primary) hypertension: Secondary | ICD-10-CM | POA: Diagnosis not present

## 2021-02-07 DIAGNOSIS — I639 Cerebral infarction, unspecified: Secondary | ICD-10-CM | POA: Diagnosis not present

## 2021-02-13 DIAGNOSIS — Z96651 Presence of right artificial knee joint: Secondary | ICD-10-CM | POA: Diagnosis not present

## 2021-02-13 DIAGNOSIS — M5441 Lumbago with sciatica, right side: Secondary | ICD-10-CM | POA: Diagnosis not present

## 2021-02-13 DIAGNOSIS — M25561 Pain in right knee: Secondary | ICD-10-CM | POA: Diagnosis not present

## 2021-02-13 DIAGNOSIS — M545 Low back pain, unspecified: Secondary | ICD-10-CM | POA: Diagnosis not present

## 2021-02-13 DIAGNOSIS — Z96652 Presence of left artificial knee joint: Secondary | ICD-10-CM | POA: Diagnosis not present

## 2021-02-13 DIAGNOSIS — M1611 Unilateral primary osteoarthritis, right hip: Secondary | ICD-10-CM | POA: Diagnosis not present

## 2021-02-13 DIAGNOSIS — Z9889 Other specified postprocedural states: Secondary | ICD-10-CM | POA: Diagnosis not present

## 2021-02-17 DIAGNOSIS — M25561 Pain in right knee: Secondary | ICD-10-CM | POA: Diagnosis not present

## 2021-02-20 ENCOUNTER — Other Ambulatory Visit: Payer: Self-pay | Admitting: Orthopedic Surgery

## 2021-02-20 ENCOUNTER — Other Ambulatory Visit (HOSPITAL_COMMUNITY): Payer: Self-pay | Admitting: Orthopedic Surgery

## 2021-02-20 DIAGNOSIS — M6281 Muscle weakness (generalized): Secondary | ICD-10-CM | POA: Diagnosis not present

## 2021-02-20 DIAGNOSIS — M25561 Pain in right knee: Secondary | ICD-10-CM

## 2021-03-06 DIAGNOSIS — E785 Hyperlipidemia, unspecified: Secondary | ICD-10-CM | POA: Diagnosis not present

## 2021-03-06 DIAGNOSIS — I10 Essential (primary) hypertension: Secondary | ICD-10-CM | POA: Diagnosis not present

## 2021-03-06 DIAGNOSIS — M130 Polyarthritis, unspecified: Secondary | ICD-10-CM | POA: Diagnosis not present

## 2021-03-11 ENCOUNTER — Ambulatory Visit (HOSPITAL_COMMUNITY): Payer: Medicare Other

## 2021-03-11 ENCOUNTER — Other Ambulatory Visit: Payer: Self-pay | Admitting: Orthopedic Surgery

## 2021-03-11 ENCOUNTER — Encounter (HOSPITAL_COMMUNITY): Payer: Medicare Other

## 2021-03-18 ENCOUNTER — Encounter (HOSPITAL_COMMUNITY)
Admission: RE | Admit: 2021-03-18 | Discharge: 2021-03-18 | Disposition: A | Discharge status: Home / Self Care | Payer: Medicare Other | Source: Ambulatory Visit | Attending: Orthopedic Surgery | Admitting: Orthopedic Surgery

## 2021-03-18 ENCOUNTER — Other Ambulatory Visit: Payer: Self-pay

## 2021-03-18 DIAGNOSIS — Z96653 Presence of artificial knee joint, bilateral: Secondary | ICD-10-CM | POA: Diagnosis not present

## 2021-03-18 DIAGNOSIS — M25561 Pain in right knee: Secondary | ICD-10-CM | POA: Diagnosis not present

## 2021-03-18 MED ORDER — TECHNETIUM TC 99M MEDRONATE IV KIT
20.0000 | PACK | Freq: Once | INTRAVENOUS | Status: AC | PRN
  Administered 2021-03-18: 20 via INTRAVENOUS

## 2021-03-18 NOTE — Progress Notes (Signed)
IVT consulted.  Assessed PIV placed today. Staff will place in flowsheet. Flushed well, NBR attempted, [place] locked.

## 2021-03-27 DIAGNOSIS — Z96652 Presence of left artificial knee joint: Secondary | ICD-10-CM | POA: Diagnosis not present

## 2021-03-27 DIAGNOSIS — Z96651 Presence of right artificial knee joint: Secondary | ICD-10-CM | POA: Diagnosis not present

## 2021-03-27 DIAGNOSIS — M1611 Unilateral primary osteoarthritis, right hip: Secondary | ICD-10-CM | POA: Diagnosis not present

## 2021-04-03 DIAGNOSIS — E785 Hyperlipidemia, unspecified: Secondary | ICD-10-CM | POA: Diagnosis not present

## 2021-04-03 DIAGNOSIS — J441 Chronic obstructive pulmonary disease with (acute) exacerbation: Secondary | ICD-10-CM | POA: Diagnosis not present

## 2021-04-03 DIAGNOSIS — I10 Essential (primary) hypertension: Secondary | ICD-10-CM | POA: Diagnosis not present

## 2021-04-03 DIAGNOSIS — R7309 Other abnormal glucose: Secondary | ICD-10-CM | POA: Diagnosis not present

## 2021-04-05 DIAGNOSIS — I10 Essential (primary) hypertension: Secondary | ICD-10-CM | POA: Diagnosis not present

## 2021-04-05 DIAGNOSIS — E785 Hyperlipidemia, unspecified: Secondary | ICD-10-CM | POA: Diagnosis not present

## 2021-04-05 DIAGNOSIS — M130 Polyarthritis, unspecified: Secondary | ICD-10-CM | POA: Diagnosis not present

## 2021-04-12 DIAGNOSIS — J45909 Unspecified asthma, uncomplicated: Secondary | ICD-10-CM | POA: Diagnosis not present

## 2021-04-12 DIAGNOSIS — I69354 Hemiplegia and hemiparesis following cerebral infarction affecting left non-dominant side: Secondary | ICD-10-CM | POA: Diagnosis not present

## 2021-04-12 DIAGNOSIS — K219 Gastro-esophageal reflux disease without esophagitis: Secondary | ICD-10-CM | POA: Diagnosis not present

## 2021-04-12 DIAGNOSIS — Z96653 Presence of artificial knee joint, bilateral: Secondary | ICD-10-CM | POA: Diagnosis not present

## 2021-04-12 DIAGNOSIS — M1990 Unspecified osteoarthritis, unspecified site: Secondary | ICD-10-CM | POA: Diagnosis not present

## 2021-04-12 DIAGNOSIS — E785 Hyperlipidemia, unspecified: Secondary | ICD-10-CM | POA: Diagnosis not present

## 2021-04-12 DIAGNOSIS — F17210 Nicotine dependence, cigarettes, uncomplicated: Secondary | ICD-10-CM | POA: Diagnosis not present

## 2021-04-12 DIAGNOSIS — M25559 Pain in unspecified hip: Secondary | ICD-10-CM | POA: Diagnosis not present

## 2021-04-12 DIAGNOSIS — I10 Essential (primary) hypertension: Secondary | ICD-10-CM | POA: Diagnosis not present

## 2021-04-12 DIAGNOSIS — Z7951 Long term (current) use of inhaled steroids: Secondary | ICD-10-CM | POA: Diagnosis not present

## 2021-04-12 DIAGNOSIS — M069 Rheumatoid arthritis, unspecified: Secondary | ICD-10-CM | POA: Diagnosis not present

## 2021-04-12 DIAGNOSIS — M545 Low back pain, unspecified: Secondary | ICD-10-CM | POA: Diagnosis not present

## 2021-04-13 DIAGNOSIS — I69354 Hemiplegia and hemiparesis following cerebral infarction affecting left non-dominant side: Secondary | ICD-10-CM | POA: Diagnosis not present

## 2021-04-13 DIAGNOSIS — Z7951 Long term (current) use of inhaled steroids: Secondary | ICD-10-CM | POA: Diagnosis not present

## 2021-04-13 DIAGNOSIS — Z96653 Presence of artificial knee joint, bilateral: Secondary | ICD-10-CM | POA: Diagnosis not present

## 2021-04-13 DIAGNOSIS — M25559 Pain in unspecified hip: Secondary | ICD-10-CM | POA: Diagnosis not present

## 2021-04-13 DIAGNOSIS — J45909 Unspecified asthma, uncomplicated: Secondary | ICD-10-CM | POA: Diagnosis not present

## 2021-04-13 DIAGNOSIS — M069 Rheumatoid arthritis, unspecified: Secondary | ICD-10-CM | POA: Diagnosis not present

## 2021-04-13 DIAGNOSIS — F17210 Nicotine dependence, cigarettes, uncomplicated: Secondary | ICD-10-CM | POA: Diagnosis not present

## 2021-04-13 DIAGNOSIS — E785 Hyperlipidemia, unspecified: Secondary | ICD-10-CM | POA: Diagnosis not present

## 2021-04-13 DIAGNOSIS — M545 Low back pain, unspecified: Secondary | ICD-10-CM | POA: Diagnosis not present

## 2021-04-13 DIAGNOSIS — I10 Essential (primary) hypertension: Secondary | ICD-10-CM | POA: Diagnosis not present

## 2021-04-13 DIAGNOSIS — M1990 Unspecified osteoarthritis, unspecified site: Secondary | ICD-10-CM | POA: Diagnosis not present

## 2021-04-13 DIAGNOSIS — K219 Gastro-esophageal reflux disease without esophagitis: Secondary | ICD-10-CM | POA: Diagnosis not present

## 2021-04-18 DIAGNOSIS — I10 Essential (primary) hypertension: Secondary | ICD-10-CM | POA: Diagnosis not present

## 2021-04-18 DIAGNOSIS — E785 Hyperlipidemia, unspecified: Secondary | ICD-10-CM | POA: Diagnosis not present

## 2021-04-18 DIAGNOSIS — M545 Low back pain, unspecified: Secondary | ICD-10-CM | POA: Diagnosis not present

## 2021-04-18 DIAGNOSIS — M069 Rheumatoid arthritis, unspecified: Secondary | ICD-10-CM | POA: Diagnosis not present

## 2021-04-18 DIAGNOSIS — J45909 Unspecified asthma, uncomplicated: Secondary | ICD-10-CM | POA: Diagnosis not present

## 2021-04-18 DIAGNOSIS — K219 Gastro-esophageal reflux disease without esophagitis: Secondary | ICD-10-CM | POA: Diagnosis not present

## 2021-04-18 DIAGNOSIS — I69354 Hemiplegia and hemiparesis following cerebral infarction affecting left non-dominant side: Secondary | ICD-10-CM | POA: Diagnosis not present

## 2021-04-18 DIAGNOSIS — Z7951 Long term (current) use of inhaled steroids: Secondary | ICD-10-CM | POA: Diagnosis not present

## 2021-04-18 DIAGNOSIS — F17210 Nicotine dependence, cigarettes, uncomplicated: Secondary | ICD-10-CM | POA: Diagnosis not present

## 2021-04-18 DIAGNOSIS — Z96653 Presence of artificial knee joint, bilateral: Secondary | ICD-10-CM | POA: Diagnosis not present

## 2021-04-18 DIAGNOSIS — M25559 Pain in unspecified hip: Secondary | ICD-10-CM | POA: Diagnosis not present

## 2021-04-18 DIAGNOSIS — M1990 Unspecified osteoarthritis, unspecified site: Secondary | ICD-10-CM | POA: Diagnosis not present

## 2021-04-22 DIAGNOSIS — I69354 Hemiplegia and hemiparesis following cerebral infarction affecting left non-dominant side: Secondary | ICD-10-CM | POA: Diagnosis not present

## 2021-04-22 DIAGNOSIS — E785 Hyperlipidemia, unspecified: Secondary | ICD-10-CM | POA: Diagnosis not present

## 2021-04-22 DIAGNOSIS — M545 Low back pain, unspecified: Secondary | ICD-10-CM | POA: Diagnosis not present

## 2021-04-22 DIAGNOSIS — F17210 Nicotine dependence, cigarettes, uncomplicated: Secondary | ICD-10-CM | POA: Diagnosis not present

## 2021-04-22 DIAGNOSIS — M1990 Unspecified osteoarthritis, unspecified site: Secondary | ICD-10-CM | POA: Diagnosis not present

## 2021-04-22 DIAGNOSIS — M25559 Pain in unspecified hip: Secondary | ICD-10-CM | POA: Diagnosis not present

## 2021-04-22 DIAGNOSIS — J45909 Unspecified asthma, uncomplicated: Secondary | ICD-10-CM | POA: Diagnosis not present

## 2021-04-22 DIAGNOSIS — K219 Gastro-esophageal reflux disease without esophagitis: Secondary | ICD-10-CM | POA: Diagnosis not present

## 2021-04-22 DIAGNOSIS — I10 Essential (primary) hypertension: Secondary | ICD-10-CM | POA: Diagnosis not present

## 2021-04-22 DIAGNOSIS — Z96653 Presence of artificial knee joint, bilateral: Secondary | ICD-10-CM | POA: Diagnosis not present

## 2021-04-22 DIAGNOSIS — M069 Rheumatoid arthritis, unspecified: Secondary | ICD-10-CM | POA: Diagnosis not present

## 2021-04-22 DIAGNOSIS — Z7951 Long term (current) use of inhaled steroids: Secondary | ICD-10-CM | POA: Diagnosis not present

## 2021-04-23 DIAGNOSIS — M069 Rheumatoid arthritis, unspecified: Secondary | ICD-10-CM | POA: Diagnosis not present

## 2021-04-23 DIAGNOSIS — Z96653 Presence of artificial knee joint, bilateral: Secondary | ICD-10-CM | POA: Diagnosis not present

## 2021-04-23 DIAGNOSIS — Z7951 Long term (current) use of inhaled steroids: Secondary | ICD-10-CM | POA: Diagnosis not present

## 2021-04-23 DIAGNOSIS — M25559 Pain in unspecified hip: Secondary | ICD-10-CM | POA: Diagnosis not present

## 2021-04-23 DIAGNOSIS — J45909 Unspecified asthma, uncomplicated: Secondary | ICD-10-CM | POA: Diagnosis not present

## 2021-04-23 DIAGNOSIS — E785 Hyperlipidemia, unspecified: Secondary | ICD-10-CM | POA: Diagnosis not present

## 2021-04-23 DIAGNOSIS — F17210 Nicotine dependence, cigarettes, uncomplicated: Secondary | ICD-10-CM | POA: Diagnosis not present

## 2021-04-23 DIAGNOSIS — M545 Low back pain, unspecified: Secondary | ICD-10-CM | POA: Diagnosis not present

## 2021-04-23 DIAGNOSIS — I69354 Hemiplegia and hemiparesis following cerebral infarction affecting left non-dominant side: Secondary | ICD-10-CM | POA: Diagnosis not present

## 2021-04-23 DIAGNOSIS — K219 Gastro-esophageal reflux disease without esophagitis: Secondary | ICD-10-CM | POA: Diagnosis not present

## 2021-04-23 DIAGNOSIS — M1990 Unspecified osteoarthritis, unspecified site: Secondary | ICD-10-CM | POA: Diagnosis not present

## 2021-04-23 DIAGNOSIS — I10 Essential (primary) hypertension: Secondary | ICD-10-CM | POA: Diagnosis not present

## 2021-04-28 DIAGNOSIS — Z96653 Presence of artificial knee joint, bilateral: Secondary | ICD-10-CM | POA: Diagnosis not present

## 2021-04-28 DIAGNOSIS — M25559 Pain in unspecified hip: Secondary | ICD-10-CM | POA: Diagnosis not present

## 2021-04-28 DIAGNOSIS — K219 Gastro-esophageal reflux disease without esophagitis: Secondary | ICD-10-CM | POA: Diagnosis not present

## 2021-04-28 DIAGNOSIS — J45909 Unspecified asthma, uncomplicated: Secondary | ICD-10-CM | POA: Diagnosis not present

## 2021-04-28 DIAGNOSIS — I69354 Hemiplegia and hemiparesis following cerebral infarction affecting left non-dominant side: Secondary | ICD-10-CM | POA: Diagnosis not present

## 2021-04-28 DIAGNOSIS — Z7951 Long term (current) use of inhaled steroids: Secondary | ICD-10-CM | POA: Diagnosis not present

## 2021-04-28 DIAGNOSIS — E785 Hyperlipidemia, unspecified: Secondary | ICD-10-CM | POA: Diagnosis not present

## 2021-04-28 DIAGNOSIS — M1990 Unspecified osteoarthritis, unspecified site: Secondary | ICD-10-CM | POA: Diagnosis not present

## 2021-04-28 DIAGNOSIS — F17210 Nicotine dependence, cigarettes, uncomplicated: Secondary | ICD-10-CM | POA: Diagnosis not present

## 2021-04-28 DIAGNOSIS — M545 Low back pain, unspecified: Secondary | ICD-10-CM | POA: Diagnosis not present

## 2021-04-28 DIAGNOSIS — I10 Essential (primary) hypertension: Secondary | ICD-10-CM | POA: Diagnosis not present

## 2021-04-28 DIAGNOSIS — M069 Rheumatoid arthritis, unspecified: Secondary | ICD-10-CM | POA: Diagnosis not present

## 2021-05-01 DIAGNOSIS — M25559 Pain in unspecified hip: Secondary | ICD-10-CM | POA: Diagnosis not present

## 2021-05-01 DIAGNOSIS — Z96653 Presence of artificial knee joint, bilateral: Secondary | ICD-10-CM | POA: Diagnosis not present

## 2021-05-01 DIAGNOSIS — M545 Low back pain, unspecified: Secondary | ICD-10-CM | POA: Diagnosis not present

## 2021-05-01 DIAGNOSIS — M1990 Unspecified osteoarthritis, unspecified site: Secondary | ICD-10-CM | POA: Diagnosis not present

## 2021-05-01 DIAGNOSIS — I10 Essential (primary) hypertension: Secondary | ICD-10-CM | POA: Diagnosis not present

## 2021-05-01 DIAGNOSIS — M069 Rheumatoid arthritis, unspecified: Secondary | ICD-10-CM | POA: Diagnosis not present

## 2021-05-01 DIAGNOSIS — J45909 Unspecified asthma, uncomplicated: Secondary | ICD-10-CM | POA: Diagnosis not present

## 2021-05-01 DIAGNOSIS — Z7951 Long term (current) use of inhaled steroids: Secondary | ICD-10-CM | POA: Diagnosis not present

## 2021-05-01 DIAGNOSIS — K219 Gastro-esophageal reflux disease without esophagitis: Secondary | ICD-10-CM | POA: Diagnosis not present

## 2021-05-01 DIAGNOSIS — I69354 Hemiplegia and hemiparesis following cerebral infarction affecting left non-dominant side: Secondary | ICD-10-CM | POA: Diagnosis not present

## 2021-05-01 DIAGNOSIS — F17210 Nicotine dependence, cigarettes, uncomplicated: Secondary | ICD-10-CM | POA: Diagnosis not present

## 2021-05-01 DIAGNOSIS — E785 Hyperlipidemia, unspecified: Secondary | ICD-10-CM | POA: Diagnosis not present

## 2021-05-06 DIAGNOSIS — M069 Rheumatoid arthritis, unspecified: Secondary | ICD-10-CM | POA: Diagnosis not present

## 2021-05-06 DIAGNOSIS — I10 Essential (primary) hypertension: Secondary | ICD-10-CM | POA: Diagnosis not present

## 2021-05-06 DIAGNOSIS — M545 Low back pain, unspecified: Secondary | ICD-10-CM | POA: Diagnosis not present

## 2021-05-06 DIAGNOSIS — Z720 Tobacco use: Secondary | ICD-10-CM | POA: Diagnosis not present

## 2021-05-06 DIAGNOSIS — K219 Gastro-esophageal reflux disease without esophagitis: Secondary | ICD-10-CM | POA: Diagnosis not present

## 2021-05-06 DIAGNOSIS — Z7951 Long term (current) use of inhaled steroids: Secondary | ICD-10-CM | POA: Diagnosis not present

## 2021-05-06 DIAGNOSIS — M1990 Unspecified osteoarthritis, unspecified site: Secondary | ICD-10-CM | POA: Diagnosis not present

## 2021-05-06 DIAGNOSIS — E785 Hyperlipidemia, unspecified: Secondary | ICD-10-CM | POA: Diagnosis not present

## 2021-05-06 DIAGNOSIS — J45909 Unspecified asthma, uncomplicated: Secondary | ICD-10-CM | POA: Diagnosis not present

## 2021-05-06 DIAGNOSIS — M130 Polyarthritis, unspecified: Secondary | ICD-10-CM | POA: Diagnosis not present

## 2021-05-06 DIAGNOSIS — M25559 Pain in unspecified hip: Secondary | ICD-10-CM | POA: Diagnosis not present

## 2021-05-06 DIAGNOSIS — I69354 Hemiplegia and hemiparesis following cerebral infarction affecting left non-dominant side: Secondary | ICD-10-CM | POA: Diagnosis not present

## 2021-05-06 DIAGNOSIS — F17210 Nicotine dependence, cigarettes, uncomplicated: Secondary | ICD-10-CM | POA: Diagnosis not present

## 2021-05-06 DIAGNOSIS — Z96653 Presence of artificial knee joint, bilateral: Secondary | ICD-10-CM | POA: Diagnosis not present

## 2021-05-07 DIAGNOSIS — I69354 Hemiplegia and hemiparesis following cerebral infarction affecting left non-dominant side: Secondary | ICD-10-CM | POA: Diagnosis not present

## 2021-05-07 DIAGNOSIS — E785 Hyperlipidemia, unspecified: Secondary | ICD-10-CM | POA: Diagnosis not present

## 2021-05-07 DIAGNOSIS — Z7951 Long term (current) use of inhaled steroids: Secondary | ICD-10-CM | POA: Diagnosis not present

## 2021-05-07 DIAGNOSIS — M545 Low back pain, unspecified: Secondary | ICD-10-CM | POA: Diagnosis not present

## 2021-05-07 DIAGNOSIS — I10 Essential (primary) hypertension: Secondary | ICD-10-CM | POA: Diagnosis not present

## 2021-05-07 DIAGNOSIS — K219 Gastro-esophageal reflux disease without esophagitis: Secondary | ICD-10-CM | POA: Diagnosis not present

## 2021-05-07 DIAGNOSIS — M1990 Unspecified osteoarthritis, unspecified site: Secondary | ICD-10-CM | POA: Diagnosis not present

## 2021-05-07 DIAGNOSIS — Z96653 Presence of artificial knee joint, bilateral: Secondary | ICD-10-CM | POA: Diagnosis not present

## 2021-05-07 DIAGNOSIS — J45909 Unspecified asthma, uncomplicated: Secondary | ICD-10-CM | POA: Diagnosis not present

## 2021-05-07 DIAGNOSIS — F17210 Nicotine dependence, cigarettes, uncomplicated: Secondary | ICD-10-CM | POA: Diagnosis not present

## 2021-05-07 DIAGNOSIS — M25559 Pain in unspecified hip: Secondary | ICD-10-CM | POA: Diagnosis not present

## 2021-05-07 DIAGNOSIS — M069 Rheumatoid arthritis, unspecified: Secondary | ICD-10-CM | POA: Diagnosis not present

## 2021-05-09 DIAGNOSIS — I10 Essential (primary) hypertension: Secondary | ICD-10-CM | POA: Diagnosis not present

## 2021-05-09 DIAGNOSIS — I6319 Cerebral infarction due to embolism of other precerebral artery: Secondary | ICD-10-CM | POA: Diagnosis not present

## 2021-05-09 DIAGNOSIS — Z8673 Personal history of transient ischemic attack (TIA), and cerebral infarction without residual deficits: Secondary | ICD-10-CM | POA: Diagnosis not present

## 2021-05-13 DIAGNOSIS — I69354 Hemiplegia and hemiparesis following cerebral infarction affecting left non-dominant side: Secondary | ICD-10-CM | POA: Diagnosis not present

## 2021-05-13 DIAGNOSIS — E785 Hyperlipidemia, unspecified: Secondary | ICD-10-CM | POA: Diagnosis not present

## 2021-05-13 DIAGNOSIS — M069 Rheumatoid arthritis, unspecified: Secondary | ICD-10-CM | POA: Diagnosis not present

## 2021-05-13 DIAGNOSIS — Z96653 Presence of artificial knee joint, bilateral: Secondary | ICD-10-CM | POA: Diagnosis not present

## 2021-05-13 DIAGNOSIS — Z7951 Long term (current) use of inhaled steroids: Secondary | ICD-10-CM | POA: Diagnosis not present

## 2021-05-13 DIAGNOSIS — J45909 Unspecified asthma, uncomplicated: Secondary | ICD-10-CM | POA: Diagnosis not present

## 2021-05-13 DIAGNOSIS — K219 Gastro-esophageal reflux disease without esophagitis: Secondary | ICD-10-CM | POA: Diagnosis not present

## 2021-05-13 DIAGNOSIS — M545 Low back pain, unspecified: Secondary | ICD-10-CM | POA: Diagnosis not present

## 2021-05-13 DIAGNOSIS — M25559 Pain in unspecified hip: Secondary | ICD-10-CM | POA: Diagnosis not present

## 2021-05-13 DIAGNOSIS — F17210 Nicotine dependence, cigarettes, uncomplicated: Secondary | ICD-10-CM | POA: Diagnosis not present

## 2021-05-13 DIAGNOSIS — I10 Essential (primary) hypertension: Secondary | ICD-10-CM | POA: Diagnosis not present

## 2021-05-13 DIAGNOSIS — M1990 Unspecified osteoarthritis, unspecified site: Secondary | ICD-10-CM | POA: Diagnosis not present

## 2021-05-21 DIAGNOSIS — F17210 Nicotine dependence, cigarettes, uncomplicated: Secondary | ICD-10-CM | POA: Diagnosis not present

## 2021-05-21 DIAGNOSIS — M1990 Unspecified osteoarthritis, unspecified site: Secondary | ICD-10-CM | POA: Diagnosis not present

## 2021-05-21 DIAGNOSIS — K219 Gastro-esophageal reflux disease without esophagitis: Secondary | ICD-10-CM | POA: Diagnosis not present

## 2021-05-21 DIAGNOSIS — I69354 Hemiplegia and hemiparesis following cerebral infarction affecting left non-dominant side: Secondary | ICD-10-CM | POA: Diagnosis not present

## 2021-05-21 DIAGNOSIS — M545 Low back pain, unspecified: Secondary | ICD-10-CM | POA: Diagnosis not present

## 2021-05-21 DIAGNOSIS — Z7951 Long term (current) use of inhaled steroids: Secondary | ICD-10-CM | POA: Diagnosis not present

## 2021-05-21 DIAGNOSIS — M25559 Pain in unspecified hip: Secondary | ICD-10-CM | POA: Diagnosis not present

## 2021-05-21 DIAGNOSIS — I10 Essential (primary) hypertension: Secondary | ICD-10-CM | POA: Diagnosis not present

## 2021-05-21 DIAGNOSIS — E785 Hyperlipidemia, unspecified: Secondary | ICD-10-CM | POA: Diagnosis not present

## 2021-05-21 DIAGNOSIS — M069 Rheumatoid arthritis, unspecified: Secondary | ICD-10-CM | POA: Diagnosis not present

## 2021-05-21 DIAGNOSIS — Z96653 Presence of artificial knee joint, bilateral: Secondary | ICD-10-CM | POA: Diagnosis not present

## 2021-05-21 DIAGNOSIS — J45909 Unspecified asthma, uncomplicated: Secondary | ICD-10-CM | POA: Diagnosis not present

## 2021-05-27 DIAGNOSIS — J45909 Unspecified asthma, uncomplicated: Secondary | ICD-10-CM | POA: Diagnosis not present

## 2021-05-27 DIAGNOSIS — M25559 Pain in unspecified hip: Secondary | ICD-10-CM | POA: Diagnosis not present

## 2021-05-27 DIAGNOSIS — Z96653 Presence of artificial knee joint, bilateral: Secondary | ICD-10-CM | POA: Diagnosis not present

## 2021-05-27 DIAGNOSIS — Z7951 Long term (current) use of inhaled steroids: Secondary | ICD-10-CM | POA: Diagnosis not present

## 2021-05-27 DIAGNOSIS — I10 Essential (primary) hypertension: Secondary | ICD-10-CM | POA: Diagnosis not present

## 2021-05-27 DIAGNOSIS — M545 Low back pain, unspecified: Secondary | ICD-10-CM | POA: Diagnosis not present

## 2021-05-27 DIAGNOSIS — F17210 Nicotine dependence, cigarettes, uncomplicated: Secondary | ICD-10-CM | POA: Diagnosis not present

## 2021-05-27 DIAGNOSIS — I69354 Hemiplegia and hemiparesis following cerebral infarction affecting left non-dominant side: Secondary | ICD-10-CM | POA: Diagnosis not present

## 2021-05-27 DIAGNOSIS — M1990 Unspecified osteoarthritis, unspecified site: Secondary | ICD-10-CM | POA: Diagnosis not present

## 2021-05-27 DIAGNOSIS — E785 Hyperlipidemia, unspecified: Secondary | ICD-10-CM | POA: Diagnosis not present

## 2021-05-27 DIAGNOSIS — M069 Rheumatoid arthritis, unspecified: Secondary | ICD-10-CM | POA: Diagnosis not present

## 2021-05-27 DIAGNOSIS — K219 Gastro-esophageal reflux disease without esophagitis: Secondary | ICD-10-CM | POA: Diagnosis not present

## 2021-05-29 DIAGNOSIS — M25551 Pain in right hip: Secondary | ICD-10-CM | POA: Diagnosis not present

## 2021-05-30 ENCOUNTER — Other Ambulatory Visit: Payer: Self-pay | Admitting: Orthopedic Surgery

## 2021-05-30 DIAGNOSIS — Z01811 Encounter for preprocedural respiratory examination: Secondary | ICD-10-CM

## 2021-06-03 DIAGNOSIS — I10 Essential (primary) hypertension: Secondary | ICD-10-CM | POA: Diagnosis not present

## 2021-06-03 DIAGNOSIS — M1990 Unspecified osteoarthritis, unspecified site: Secondary | ICD-10-CM | POA: Diagnosis not present

## 2021-06-03 DIAGNOSIS — Z7951 Long term (current) use of inhaled steroids: Secondary | ICD-10-CM | POA: Diagnosis not present

## 2021-06-03 DIAGNOSIS — E785 Hyperlipidemia, unspecified: Secondary | ICD-10-CM | POA: Diagnosis not present

## 2021-06-03 DIAGNOSIS — M25559 Pain in unspecified hip: Secondary | ICD-10-CM | POA: Diagnosis not present

## 2021-06-03 DIAGNOSIS — J45909 Unspecified asthma, uncomplicated: Secondary | ICD-10-CM | POA: Diagnosis not present

## 2021-06-03 DIAGNOSIS — Z96653 Presence of artificial knee joint, bilateral: Secondary | ICD-10-CM | POA: Diagnosis not present

## 2021-06-03 DIAGNOSIS — K219 Gastro-esophageal reflux disease without esophagitis: Secondary | ICD-10-CM | POA: Diagnosis not present

## 2021-06-03 DIAGNOSIS — M545 Low back pain, unspecified: Secondary | ICD-10-CM | POA: Diagnosis not present

## 2021-06-03 DIAGNOSIS — F17210 Nicotine dependence, cigarettes, uncomplicated: Secondary | ICD-10-CM | POA: Diagnosis not present

## 2021-06-03 DIAGNOSIS — I69354 Hemiplegia and hemiparesis following cerebral infarction affecting left non-dominant side: Secondary | ICD-10-CM | POA: Diagnosis not present

## 2021-06-03 DIAGNOSIS — M069 Rheumatoid arthritis, unspecified: Secondary | ICD-10-CM | POA: Diagnosis not present

## 2021-06-05 ENCOUNTER — Encounter (HOSPITAL_COMMUNITY): Payer: Self-pay | Admitting: Orthopedic Surgery

## 2021-06-05 ENCOUNTER — Other Ambulatory Visit: Payer: Self-pay

## 2021-06-05 DIAGNOSIS — I10 Essential (primary) hypertension: Secondary | ICD-10-CM | POA: Diagnosis not present

## 2021-06-05 DIAGNOSIS — M130 Polyarthritis, unspecified: Secondary | ICD-10-CM | POA: Diagnosis not present

## 2021-06-05 DIAGNOSIS — E785 Hyperlipidemia, unspecified: Secondary | ICD-10-CM | POA: Diagnosis not present

## 2021-06-05 DIAGNOSIS — Z720 Tobacco use: Secondary | ICD-10-CM | POA: Diagnosis not present

## 2021-06-05 NOTE — Patient Instructions (Signed)
DUE TO COVID-19 ONLY ONE VISITOR IS ALLOWED TO COME WITH YOU AND STAY IN THE WAITING ROOM ONLY DURING PRE OP AND PROCEDURE.   **NO VISITORS ARE ALLOWED IN THE SHORT STAY AREA OR RECOVERY ROOM!!**  IF YOU WILL BE ADMITTED INTO THE HOSPITAL YOU ARE ALLOWED ONLY TWO SUPPORT PEOPLE DURING VISITATION HOURS ONLY (10AM -8PM)   The support person(s) may change daily. The support person(s) must pass our screening, gel in and out, and wear a mask at all times, including in the patient's room. Patients must also wear a mask when staff or their support person are in the room.  No visitors under the age of 44. Any visitor under the age of 62 must be accompanied by an adult.    COVID SWAB TESTING MUST BE COMPLETED ON:  Tuesday, June 10, 2021 at 2:15 PM   [address] [place], [place] [address]   You are not required to quarantine, however you are required to wear a well-fitted mask when you are out and around people not in your household.  Hand Hygiene often Do NOT share personal items Notify your provider if you are in close contact with someone who has COVID or you develop fever 100.4 or greater, new onset of sneezing, cough, sore throat, shortness of breath or body aches.       Your procedure is scheduled on: Wednesday, June 11, 2021   Report to [facility] Main  Entrance    Report to admitting at 11:45 AM   Call this number if you have problems the morning of surgery [phone]   Do not eat food :After Midnight.   May have liquids until  11:15 AM  day of surgery  CLEAR LIQUID DIET  Foods Allowed                                                                     Foods Excluded  Water, Black Coffee and tea, regular and decaf                             liquids that you cannot  Plain Jell-O in any flavor  (No red)                                           see through such as: Fruit ices (not with fruit pulp)                                     milk, soups, orange juice               Iced Popsicles (No red)                                    All solid food  Apple juices Sports drinks like Gatorade (No red) Lightly seasoned clear broth or consume(fat free) Sugar, honey syrup  Sample Menu Breakfast                                Lunch                                     Supper Cranberry juice                    Beef broth                            Chicken broth Jell-O                                     Grape juice                           Apple juice Coffee or tea                        Jell-O                                      Popsicle                                                Coffee or tea                        Coffee or tea        The day of surgery:  Drink ONE (1) Pre-Surgery Clear Ensure 11:15 AM the morning of surgery. Drink in one sitting. Do not sip.  This drink was given to you during your hospital  pre-op appointment visit. Nothing else to drink after completing the  Pre-Surgery Clear Ensure.          If you have questions, please contact your surgeon's office.     Oral Hygiene is also important to reduce your risk of infection.                                    Remember - BRUSH YOUR TEETH THE MORNING OF SURGERY WITH YOUR REGULAR TOOTHPASTE   Do NOT smoke after Midnight   Take these medicines the morning of surgery with A SIP OF WATER: None                              You may not have any metal on your body including hair pins, jewelry, and body piercing             Do not wear make-up, lotions, powders, perfumes/cologne, or deodorant  Do not wear nail polish including gel and S&S, artificial/acrylic nails, or any other type of covering on natural nails including finger and  toenails. If you have artificial nails, gel coating, etc. that needs to be removed by a nail salon please have this removed prior to surgery or surgery may need to be canceled/ delayed if the surgeon/ anesthesia feels like they are  unable to be safely monitored.   Do not shave  48 hours prior to surgery.    Do not bring valuables to the hospital. [place].   Contacts, dentures or bridgework may not be worn into surgery.   Bring small overnight bag day of surgery.    Special Instructions: Bring a copy of your healthcare power of attorney and living will documents         the day of surgery if you haven't scanned them in before.              Please read over the following fact sheets you were given: IF YOU HAVE QUESTIONS ABOUT YOUR PRE OP INSTRUCTIONS PLEASE CALL [phone]   [place] - Preparing for Surgery Before surgery, you can play an important role.  Because skin is not sterile, your skin needs to be as free of germs as possible.  You can reduce the number of germs on your skin by washing with CHG (chlorahexidine gluconate) soap before surgery.  CHG is an antiseptic cleaner which kills germs and bonds with the skin to continue killing germs even after washing. Please DO NOT use if you have an allergy to CHG or antibacterial soaps.  If your skin becomes reddened/irritated stop using the CHG and inform your nurse when you arrive at Short Stay. Do not shave (including legs and underarms) for at least 48 hours prior to the first CHG shower.  You may shave your face/neck.  Please follow these instructions carefully:  1.  Shower with CHG Soap the night before surgery and the  morning of surgery.  2.  If you choose to wash your hair, wash your hair first as usual with your normal  shampoo.  3.  After you shampoo, rinse your hair and body thoroughly to remove the shampoo.                             4.  Use CHG as you would any other liquid soap.  You can apply chg directly to the skin and wash.  Gently with a scrungie or clean washcloth.  5.  Apply the CHG Soap to your body ONLY FROM THE NECK DOWN.   Do   not use on face/ open                           Wound or open  sores. Avoid contact with eyes, ears mouth and   genitals (private parts).                       Wash face,  Genitals (private parts) with your normal soap.             6.  Wash thoroughly, paying special attention to the area where your    surgery  will be performed.  7.  Thoroughly rinse your body with warm water from the neck down.  8.  DO NOT shower/wash with your normal soap after using and rinsing off the CHG Soap.  9.  Pat yourself dry with a clean towel.            10.  Wear clean pajamas.            11.  Place clean sheets on your bed the night of your first shower and do not  sleep with pets. Day of Surgery : Do not apply any lotions/deodorants the morning of surgery.  Please wear clean clothes to the hospital/surgery center.  FAILURE TO FOLLOW THESE INSTRUCTIONS MAY RESULT IN THE CANCELLATION OF YOUR SURGERY  PATIENT SIGNATURE_________________________________  NURSE SIGNATURE__________________________________  ________________________________________________________________________   Ellen Henry  An incentive spirometer is a tool that can help keep your lungs clear and active. This tool measures how well you are filling your lungs with each breath. Taking long deep breaths may help reverse or decrease the chance of developing breathing (pulmonary) problems (especially infection) following: A long period of time when you are unable to move or be active. BEFORE THE PROCEDURE  If the spirometer includes an indicator to show your best effort, your nurse or respiratory therapist will set it to a desired goal. If possible, sit up straight or lean slightly forward. Try not to slouch. Hold the incentive spirometer in an upright position. INSTRUCTIONS FOR USE  Sit on the edge of your bed if possible, or sit up as far as you can in bed or on a chair. Hold the incentive spirometer in an upright position. Breathe out normally. Place the mouthpiece in your mouth  and seal your lips tightly around it. Breathe in slowly and as deeply as possible, raising the piston or the ball toward the top of the column. Hold your breath for 3-5 seconds or for as long as possible. Allow the piston or ball to fall to the bottom of the column. Remove the mouthpiece from your mouth and breathe out normally. Rest for a few seconds and repeat Steps 1 through 7 at least 10 times every 1-2 hours when you are awake. Take your time and take a few normal breaths between deep breaths. The spirometer may include an indicator to show your best effort. Use the indicator as a goal to work toward during each repetition. After each set of 10 deep breaths, practice coughing to be sure your lungs are clear. If you have an incision (the cut made at the time of surgery), support your incision when coughing by placing a pillow or rolled up towels firmly against it. Once you are able to get out of bed, walk around indoors and cough well. You may stop using the incentive spirometer when instructed by your caregiver.  RISKS AND COMPLICATIONS Take your time so you do not get dizzy or light-headed. If you are in pain, you may need to take or ask for pain medication before doing incentive spirometry. It is harder to take a deep breath if you are having pain. AFTER USE Rest and breathe slowly and easily. It can be helpful to keep track of a log of your progress. Your caregiver can provide you with a simple table to help with this. If you are using the spirometer at home, follow these instructions: [place] IF:  You are having difficultly using the spirometer. You have trouble using the spirometer as often as instructed. Your pain medication is not giving enough relief while using the spirometer. You develop fever of 100.5 F (38.1 C) or higher. SEEK IMMEDIATE MEDICAL CARE IF:  You cough up bloody sputum that had  not been present before. You develop fever of 102 F (38.9 C) or  greater. You develop worsening pain at or near the incision site. MAKE SURE YOU:  Understand these instructions. Will watch your condition. Will get help right away if you are not doing well or get worse. Document Released: 04/05/2007 Document Revised: 02/15/2012 Document Reviewed: 06/06/2007 ExitCare Patient Information 2014 ExitCare, [place].   ________________________________________________________________________  WHAT IS A BLOOD TRANSFUSION? Blood Transfusion Information  A transfusion is the replacement of blood or some of its parts. Blood is made up of multiple cells which provide different functions. Red blood cells carry oxygen and are used for blood loss replacement. White blood cells fight against infection. Platelets control bleeding. Plasma helps clot blood. Other blood products are available for specialized needs, such as hemophilia or other clotting disorders. BEFORE THE TRANSFUSION  Who gives blood for transfusions?  Healthy volunteers who are fully evaluated to make sure their blood is safe. This is blood bank blood. Transfusion therapy is the safest it has ever been in the practice of medicine. Before blood is taken from a donor, a complete history is taken to make sure that person has no history of diseases nor engages in risky social behavior (examples are intravenous drug use or sexual activity with multiple partners). The donor's travel history is screened to minimize risk of transmitting infections, such as malaria. The donated blood is tested for signs of infectious diseases, such as HIV and hepatitis. The blood is then tested to be sure it is compatible with you in order to minimize the chance of a transfusion reaction. If you or a relative donates blood, this is often done in anticipation of surgery and is not appropriate for emergency situations. It takes many days to process the donated blood. RISKS AND COMPLICATIONS Although transfusion therapy is very safe and  saves many lives, the main dangers of transfusion include:  Getting an infectious disease. Developing a transfusion reaction. This is an allergic reaction to something in the blood you were given. Every precaution is taken to prevent this. The decision to have a blood transfusion has been considered carefully by your caregiver before blood is given. Blood is not given unless the benefits outweigh the risks. AFTER THE TRANSFUSION Right after receiving a blood transfusion, you will usually feel much better and more energetic. This is especially true if your red blood cells have gotten low (anemic). The transfusion raises the level of the red blood cells which carry oxygen, and this usually causes an energy increase. The nurse administering the transfusion will monitor you carefully for complications. HOME CARE INSTRUCTIONS  No special instructions are needed after a transfusion. You may find your energy is better. Speak with your caregiver about any limitations on activity for underlying diseases you may have. SEEK MEDICAL CARE IF:  Your condition is not improving after your transfusion. You develop redness or irritation at the intravenous (IV) site. SEEK IMMEDIATE MEDICAL CARE IF:  Any of the following symptoms occur over the next 12 hours: Shaking chills. You have a temperature by mouth above 102 F (38.9 C), not controlled by medicine. Chest, back, or muscle pain. People around you feel you are not acting correctly or are confused. Shortness of breath or difficulty breathing. Dizziness and fainting. You get a rash or develop hives. You have a decrease in urine output. Your urine turns a dark color or changes to pink, red, or brown. Any of the following symptoms occur over the next 10 days:  You have a temperature by mouth above 102 F (38.9 C), not controlled by medicine. Shortness of breath. Weakness after normal activity. The white part of the eye turns yellow (jaundice). You have a  decrease in the amount of urine or are urinating less often. Your urine turns a dark color or changes to pink, red, or brown. Document Released: 11/20/2000 Document Revised: 02/15/2012 Document Reviewed: 07/09/2008 [facility] Patient Information 2014 [place], [place].  _______________________________________________________________________

## 2021-06-05 NOTE — Progress Notes (Signed)
COVID Vaccine Completed: Yes Date COVID Vaccine completed: 03/26/20 Has received booster: x1 COVID vaccine manufacturer: Pfizer     Date of COVID positive in last 90 days: No  PCP - Lucianne Lei, MD Cardiologist - N/A Neurologist-N/A  Chest x-ray - 06/06/2021 in epic EKG - 06/06/21 in epic Stress Test - N/A ECHO - greater than 2 years in epic Cardiac Cath - N/A Pacemaker/ICD device last checked: N/A  Sleep Study - Yes CPAP - No  Fasting Blood Sugar - N/A Checks Blood Sugar ___N/A__ times a day  Blood Thinner Instructions: N/A Aspirin Instructions: N/A Last Dose: N/A  Activity level:  activities of daily living without stopping and without symptoms       Anesthesia review: History of stroke, HTN. OSA  Patient denies shortness of breath, fever, cough and chest pain at PAT appointment   Patient verbalized understanding of instructions that were given to them at the PAT appointment. Patient was also instructed that they will need to review over the PAT instructions again at home before surgery.

## 2021-06-06 ENCOUNTER — Encounter (HOSPITAL_COMMUNITY)
Admission: RE | Admit: 2021-06-06 | Discharge: 2021-06-06 | Disposition: A | Discharge status: Home / Self Care | Payer: Medicare Other | Source: Ambulatory Visit | Attending: Orthopedic Surgery | Admitting: Orthopedic Surgery

## 2021-06-06 ENCOUNTER — Ambulatory Visit (HOSPITAL_COMMUNITY)
Admission: RE | Admit: 2021-06-06 | Discharge: 2021-06-06 | Disposition: A | Discharge status: Home / Self Care | Payer: Medicare Other | Source: Ambulatory Visit | Attending: Orthopedic Surgery | Admitting: Orthopedic Surgery

## 2021-06-06 DIAGNOSIS — Z01811 Encounter for preprocedural respiratory examination: Secondary | ICD-10-CM | POA: Insufficient documentation

## 2021-06-06 DIAGNOSIS — Z01818 Encounter for other preprocedural examination: Secondary | ICD-10-CM | POA: Diagnosis not present

## 2021-06-06 LAB — SURGICAL PCR SCREEN
MRSA, PCR: NEGATIVE
Staphylococcus aureus: NEGATIVE

## 2021-06-06 LAB — URINALYSIS, ROUTINE W REFLEX MICROSCOPIC
Bilirubin Urine: NEGATIVE
Glucose, UA: NEGATIVE mg/dL
Hgb urine dipstick: NEGATIVE
Ketones, ur: NEGATIVE mg/dL
Leukocytes,Ua: NEGATIVE
Nitrite: NEGATIVE
Protein, ur: NEGATIVE mg/dL
Specific Gravity, Urine: 1.006 (ref 1.005–1.030)
pH: 7 (ref 5.0–8.0)

## 2021-06-06 NOTE — Progress Notes (Signed)
Ellen Henry x4 unsuccessful attempts to draw blood in presurgical testing. We transferred her to lab x4 successful attempts as well. We nned labs drawn morning of procedure.

## 2021-06-06 NOTE — Care Plan (Signed)
Ortho Bundle Case Management Note  Patient Details  Name: Ellen Henry MRN: [id number] Date of Birth: 1946-03-26     Spoke with patient's daughters prior to surgery. Her pain is extreme due to deterioration of  her hip. Surgery moved up to help this. She will discharge to home with her daughter - Oran Rein - [address] [place], [place] - phone is [phone].  Patient's phone is broken. Other daughter, Helene Kelp, is contact as well - [phone].   Patient was open to [facility] prior to admission and they will provide HHPT at discharge. THey are aware of the address change. OPPT set up with [facility].  Daughters and MD are aware of the plan and in agreement. Choice offered. THey will communicate with the patient.                 DME Arranged:  Bedside commode DME Agency:  [employer]  HH Arranged:  PT [place] Agency:  [facility]  Additional Comments: Please contact me with any questions of if this plan should need to change.  Ladell Heads,  [place] [facility]  [phone] 06/06/2021, 7:58 AM

## 2021-06-10 ENCOUNTER — Other Ambulatory Visit (HOSPITAL_COMMUNITY)
Admission: RE | Admit: 2021-06-10 | Discharge: 2021-06-10 | Disposition: A | Discharge status: Home / Self Care | Payer: Medicare Other | Source: Ambulatory Visit | Attending: Orthopedic Surgery | Admitting: Orthopedic Surgery

## 2021-06-10 ENCOUNTER — Other Ambulatory Visit (HOSPITAL_COMMUNITY): Payer: Medicare Other

## 2021-06-10 DIAGNOSIS — Z7982 Long term (current) use of aspirin: Secondary | ICD-10-CM | POA: Diagnosis not present

## 2021-06-10 DIAGNOSIS — Z96611 Presence of right artificial shoulder joint: Secondary | ICD-10-CM | POA: Diagnosis not present

## 2021-06-10 DIAGNOSIS — G894 Chronic pain syndrome: Secondary | ICD-10-CM | POA: Diagnosis not present

## 2021-06-10 DIAGNOSIS — Z01812 Encounter for preprocedural laboratory examination: Secondary | ICD-10-CM | POA: Insufficient documentation

## 2021-06-10 DIAGNOSIS — Z96653 Presence of artificial knee joint, bilateral: Secondary | ICD-10-CM | POA: Diagnosis not present

## 2021-06-10 DIAGNOSIS — E785 Hyperlipidemia, unspecified: Secondary | ICD-10-CM | POA: Diagnosis not present

## 2021-06-10 DIAGNOSIS — Z87891 Personal history of nicotine dependence: Secondary | ICD-10-CM | POA: Diagnosis not present

## 2021-06-10 DIAGNOSIS — Z20822 Contact with and (suspected) exposure to covid-19: Secondary | ICD-10-CM | POA: Insufficient documentation

## 2021-06-10 DIAGNOSIS — Z471 Aftercare following joint replacement surgery: Secondary | ICD-10-CM | POA: Diagnosis not present

## 2021-06-10 DIAGNOSIS — I69391 Dysphagia following cerebral infarction: Secondary | ICD-10-CM | POA: Diagnosis not present

## 2021-06-10 DIAGNOSIS — M1612 Unilateral primary osteoarthritis, left hip: Secondary | ICD-10-CM | POA: Diagnosis not present

## 2021-06-10 DIAGNOSIS — I69354 Hemiplegia and hemiparesis following cerebral infarction affecting left non-dominant side: Secondary | ICD-10-CM | POA: Diagnosis not present

## 2021-06-10 DIAGNOSIS — Z6841 Body Mass Index (BMI) 40.0 and over, adult: Secondary | ICD-10-CM | POA: Diagnosis not present

## 2021-06-10 DIAGNOSIS — G4730 Sleep apnea, unspecified: Secondary | ICD-10-CM | POA: Diagnosis not present

## 2021-06-10 DIAGNOSIS — Z981 Arthrodesis status: Secondary | ICD-10-CM | POA: Diagnosis not present

## 2021-06-10 DIAGNOSIS — I10 Essential (primary) hypertension: Secondary | ICD-10-CM | POA: Diagnosis not present

## 2021-06-10 DIAGNOSIS — Z96641 Presence of right artificial hip joint: Secondary | ICD-10-CM | POA: Diagnosis not present

## 2021-06-10 DIAGNOSIS — Z79899 Other long term (current) drug therapy: Secondary | ICD-10-CM | POA: Diagnosis not present

## 2021-06-10 DIAGNOSIS — R1310 Dysphagia, unspecified: Secondary | ICD-10-CM | POA: Diagnosis not present

## 2021-06-10 DIAGNOSIS — M1611 Unilateral primary osteoarthritis, right hip: Secondary | ICD-10-CM | POA: Diagnosis not present

## 2021-06-10 DIAGNOSIS — M109 Gout, unspecified: Secondary | ICD-10-CM | POA: Diagnosis not present

## 2021-06-10 MED ORDER — BUPIVACAINE LIPOSOME 1.3 % IJ SUSP
10.0000 mL | Freq: Once | INTRAMUSCULAR | Status: DC
  Filled 2021-06-10: qty 10

## 2021-06-10 NOTE — Progress Notes (Signed)
Anesthesia Chart Review:   Case: [id number] Date/Time: 06/11/21 1400   Procedure: TOTAL HIP ARTHROPLASTY ANTERIOR APPROACH (Right: Hip)   Anesthesia type: Spinal   Pre-op diagnosis: RIGHT HIP OSTEOARTHRITIS   Location: [facility] ROOM 07 / [facility]   Surgeons: [name], MD       DISCUSSION: Pt is 75 years old with hx stroke (12/2016), HTN, heart murmur, OSA, GERD. Quit smoking 04/04/21. BMI 41.   Anesthesia history: had difficulty breathing and drop in O2 sats after interscalene block for R shoulder arthroscopy at outside surgical center 12/09/17.  Pt tx with albuterol, decadron, and O2.  Transferred to ED for further care. Surgical center records on paper chart for review, ED notes in Epic  Concern for poor vascular access. Pt had 8 unsuccessful attempts to obtain blood sample for pre-op lab tests.    VS: BP 123/78   Pulse 84   Temp 36.9 C (Oral)   Resp 16   Ht 5' (1.524 m)   Wt 94.9 kg   SpO2 100%   BMI 40.86 kg/m   PROVIDERS: - PCP Lucianne Lei, MD   LABS: Will need to be obtained day of surgery.   (all labs ordered are listed, but only abnormal results are displayed)  Labs Reviewed  URINALYSIS, ROUTINE W REFLEX MICROSCOPIC - Abnormal; Notable for the following components:      Result Value   Color, Urine STRAW (*)    All other components within normal limits  SURGICAL PCR SCREEN     IMAGES: CXR 06/06/21: No active cardiopulmonary disease.   EKG: Will be obtained day of surgery    CV: Echo 12/17/16:  - Left ventricle: The cavity size was normal. There was mild concentric hypertrophy. Systolic function was normal. The estimated ejection fraction was in the range of 60% to 65%. Wall motion was normal; there were no regional wall motion   abnormalities. Doppler parameters are consistent with abnormal left ventricular relaxation (grade 1 diastolic dysfunction). - Aortic valve: Trileaflet; normal thickness, mildly calcified leaflets. Valve area (VTI): 1.46 cm^2. Valve area  (Vmax): 1.55 cm^2. Valve area (Vmean): 1.45 cm^2. - Mitral valve: Mildly calcified annulus. Mildly calcified leaflets. - Left atrium: The atrium was moderately dilated. - Right atrium: The atrium was mildly dilated. - Impressions: No cardiac source of emboli was indentified.   Past Medical History:  Diagnosis Date   Acute kidney failure due to procedure    Arthritis    Complication of anesthesia    after nerve block difficult breathing (at [facility] [place] transported to [facility])   Dry skin    GERD (gastroesophageal reflux disease)    "years ago", diet controlled   Gout    H/O pyelonephritis    as a child   Heart murmur    since birth, denies any problems    History of sepsis    after spinal surgery   Hypertension    Impingement syndrome of right shoulder    Neuropathy    Osteoarthritis of AC (acromioclavicular) joint    Right   Pneumonia    Radiculopathy    Rotator cuff tear, right    Sleep apnea    does not use cpap   Stroke ([place]) 12/15/2016   left side weakness    Past Surgical History:  Procedure Laterality Date   ABDOMINAL EXPOSURE N/A 08/29/2014   Procedure: ABDOMINAL EXPOSURE;  Surgeon: Rosetta Posner, MD;  Location: [place];  Service: Vascular;  Laterality: N/A;   ABDOMINAL HYSTERECTOMY  ANTERIOR LUMBAR FUSION N/A 08/29/2014   Procedure: ANTERIOR LUMBAR FUSION 1 LEVEL;  Surgeon: Sinclair Ship, MD;  Location: [place];  Service: Orthopedics;  Laterality: N/A;  Lumbar 4-5 anterior lumbar interbody fusion with allograft and instrumentation.   APPENDECTOMY     BACK SURGERY  08/28/2014 and 08/29/2014   lumbar fusion   CARPAL TUNNEL RELEASE Right    release done twice   COLONOSCOPY     JOINT REPLACEMENT     left knee replacement     lower intestine removal     REPLACEMENT TOTAL KNEE  2010   rigth knee   SHOULDER ARTHROSCOPY Right 01/18/2018   Procedure: RIGHT SHOULDER ARTHROSCOPY, SUBACROMIAL DECOMPRESSION, DISTAL CLAVICLE RESECTION WITH MINI OPEN  ROTATOR CUFF REPAIR;  Surgeon: Garald Balding, MD;  Location: [place];  Service: Orthopedics;  Laterality: Right;   TONSILLECTOMY     TOTAL KNEE ARTHROPLASTY Left 02/15/2015   Procedure: TOTAL KNEE ARTHROPLASTY;  Surgeon: [name], MD;  Location: [place];  Service: Orthopedics;  Laterality: Left;    MEDICATIONS:  acetaminophen (TYLENOL) 500 MG tablet   aspirin EC 81 MG tablet   diclofenac Sodium (VOLTAREN) 1 % GEL   indapamide (LOZOL) 1.25 MG tablet   PERCOCET 5-325 MG tablet   rosuvastatin (CRESTOR) 20 MG tablet   telmisartan (MICARDIS) 40 MG tablet   No current facility-administered medications for this encounter.    [START ON 06/11/2021] bupivacaine liposome (EXPAREL) 1.3 % injection 133 mg    If labs and EKG acceptable day of surgery, I anticipate pt can proceed with surgery as scheduled.  Willeen Cass, PhD, FNP-BC [facility]/Anesthesiology Phone: [phone] 06/10/2021 9:57 AM

## 2021-06-10 NOTE — Anesthesia Preprocedure Evaluation (Addendum)
Anesthesia Evaluation  Patient identified by MRN, date of birth, ID band Patient awake  General Assessment Comment:Anesthesia history: had difficulty breathing and drop in O2 sats after interscalene block for R shoulder arthroscopy at outside surgical center 12/09/17.  Pt tx with albuterol, decadron, and O2.  Transferred to ED for further care. Surgical center records on paper chart for review, ED notes in Epic.   Medications include: ASA 81mg , lipitor, telmisartan-hctz  CXR 12/09/17:  - Elevation of the right hemidiaphragm with overlying vascular crowding and atelectasis likely phrenic nerve involvement from the nerve block. - No pneumothorax or other significant pulmonary findings.   Reviewed: Allergy & Precautions, NPO status , Patient's Chart, lab work & pertinent test results, reviewed documented beta blocker date and time   History of Anesthesia Complications (+) history of anesthetic complications  Airway Mallampati: II  TM Distance: >3 FB Neck ROM: Full    Dental  (+) Edentulous Upper, Edentulous Lower   Pulmonary sleep apnea , pneumonia, resolved, Patient abstained from smoking., former smoker,  Does not use CPAP   Pulmonary exam normal breath sounds clear to auscultation       Cardiovascular hypertension, Pt. on medications Normal cardiovascular exam+ Valvular Problems/Murmurs  Rhythm:Regular Rate:Normal  EKG 06/11/21 NSR, low voltage  Echo 12/17/16 Left ventricle: The cavity size was normal. There was mild concentric hypertrophy. Systolic function was normal. The estimated ejection fraction was in the range of 60% to 65%. Wall motion was normal; there were no regional wall motion abnormalities. Doppler parameters are consistent with abnormal left ventricular relaxation (grade 1 diastolic dysfunction).  - Aortic valve: Trileaflet; normal thickness, mildly calcified  leaflets. Valve area (VTI): 1.46 cm^2. Valve area (Vmax):  1.55 cm^2. Valve area (Vmean): 1.45 cm^2.  - Mitral valve: Mildly calcified annulus. Mildly calcified leaflets .  - Left atrium: The atrium was moderately dilated.  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  Neuromuscular disease CVA, Residual Symptoms negative psych ROS   GI/Hepatic GERD  Medicated and Controlled,  Endo/Other  Morbid obesityHyperlipidemia  Renal/GU Renal disease  negative genitourinary   Musculoskeletal  (+) Arthritis , Osteoarthritis,  Failed back syndrome Chronic back pain OA right hip   Abdominal (+) + obese,   Peds  Hematology negative hematology ROS (+)   Anesthesia Other Findings   Reproductive/Obstetrics                            Anesthesia Physical Anesthesia Plan  ASA: 3  Anesthesia Plan: Spinal   Post-op Pain Management:    Induction: Intravenous  PONV Risk Score and Plan: 3 and Treatment may vary due to age or medical condition and Propofol infusion  Airway Management Planned: Natural Airway and Simple Face Mask  Additional Equipment:   Intra-op Plan:   Post-operative Plan:   Informed Consent: I have reviewed the patients History and Physical, chart, labs and discussed the procedure including the risks, benefits and alternatives for the proposed anesthesia with the patient or authorized representative who has indicated his/her understanding and acceptance.       Plan Discussed with: CRNA and Anesthesiologist  Anesthesia Plan Comments: (See APP note by Durel Salts, FNP )       Anesthesia Quick Evaluation

## 2021-06-11 ENCOUNTER — Encounter (HOSPITAL_COMMUNITY): Payer: Self-pay | Admitting: Orthopedic Surgery

## 2021-06-11 ENCOUNTER — Encounter (HOSPITAL_COMMUNITY)
Admission: RE | Disposition: A | Discharge status: Home / Self Care | Payer: Self-pay | Source: Home / Self Care | Attending: Orthopedic Surgery

## 2021-06-11 ENCOUNTER — Ambulatory Visit (HOSPITAL_COMMUNITY): Payer: Medicare Other

## 2021-06-11 ENCOUNTER — Ambulatory Visit (HOSPITAL_COMMUNITY): Payer: Medicare Other | Admitting: Emergency Medicine

## 2021-06-11 ENCOUNTER — Ambulatory Visit (HOSPITAL_COMMUNITY): Payer: Medicare Other | Admitting: Anesthesiology

## 2021-06-11 ENCOUNTER — Inpatient Hospital Stay (HOSPITAL_COMMUNITY)
Admission: RE | Admit: 2021-06-11 | Discharge: 2021-06-15 | DRG: 470 | Disposition: A | Discharge status: Home Health | Payer: Medicare Other | Attending: Orthopedic Surgery | Admitting: Orthopedic Surgery

## 2021-06-11 DIAGNOSIS — I69354 Hemiplegia and hemiparesis following cerebral infarction affecting left non-dominant side: Secondary | ICD-10-CM

## 2021-06-11 DIAGNOSIS — Z20822 Contact with and (suspected) exposure to covid-19: Secondary | ICD-10-CM | POA: Diagnosis present

## 2021-06-11 DIAGNOSIS — Z96611 Presence of right artificial shoulder joint: Secondary | ICD-10-CM | POA: Diagnosis present

## 2021-06-11 DIAGNOSIS — Z471 Aftercare following joint replacement surgery: Secondary | ICD-10-CM | POA: Diagnosis not present

## 2021-06-11 DIAGNOSIS — Z981 Arthrodesis status: Secondary | ICD-10-CM

## 2021-06-11 DIAGNOSIS — Z419 Encounter for procedure for purposes other than remedying health state, unspecified: Secondary | ICD-10-CM

## 2021-06-11 DIAGNOSIS — I10 Essential (primary) hypertension: Secondary | ICD-10-CM | POA: Diagnosis not present

## 2021-06-11 DIAGNOSIS — Z7982 Long term (current) use of aspirin: Secondary | ICD-10-CM

## 2021-06-11 DIAGNOSIS — M1611 Unilateral primary osteoarthritis, right hip: Principal | ICD-10-CM

## 2021-06-11 DIAGNOSIS — Z96641 Presence of right artificial hip joint: Secondary | ICD-10-CM

## 2021-06-11 DIAGNOSIS — Z6841 Body Mass Index (BMI) 40.0 and over, adult: Secondary | ICD-10-CM

## 2021-06-11 DIAGNOSIS — Z96653 Presence of artificial knee joint, bilateral: Secondary | ICD-10-CM | POA: Diagnosis present

## 2021-06-11 DIAGNOSIS — Z79899 Other long term (current) drug therapy: Secondary | ICD-10-CM

## 2021-06-11 DIAGNOSIS — G4730 Sleep apnea, unspecified: Secondary | ICD-10-CM | POA: Diagnosis not present

## 2021-06-11 DIAGNOSIS — E785 Hyperlipidemia, unspecified: Secondary | ICD-10-CM | POA: Diagnosis not present

## 2021-06-11 DIAGNOSIS — I69391 Dysphagia following cerebral infarction: Secondary | ICD-10-CM

## 2021-06-11 DIAGNOSIS — R1310 Dysphagia, unspecified: Secondary | ICD-10-CM | POA: Diagnosis present

## 2021-06-11 DIAGNOSIS — G894 Chronic pain syndrome: Secondary | ICD-10-CM | POA: Diagnosis present

## 2021-06-11 DIAGNOSIS — M109 Gout, unspecified: Secondary | ICD-10-CM | POA: Diagnosis present

## 2021-06-11 DIAGNOSIS — Z87891 Personal history of nicotine dependence: Secondary | ICD-10-CM

## 2021-06-11 HISTORY — DX: Postprocedural (acute) (chronic) kidney failure: N99.0

## 2021-06-11 HISTORY — PX: TOTAL HIP ARTHROPLASTY: SHX124

## 2021-06-11 HISTORY — DX: Personal history of other infectious and parasitic diseases: Z86.19

## 2021-06-11 LAB — CBC WITH DIFFERENTIAL/PLATELET
Abs Immature Granulocytes: 0.02 10*3/uL (ref 0.00–0.07)
Basophils Absolute: 0.1 10*3/uL (ref 0.0–0.1)
Basophils Relative: 1 %
Eosinophils Absolute: 0.1 10*3/uL (ref 0.0–0.5)
Eosinophils Relative: 1 %
HCT: 37.4 % (ref 36.0–46.0)
Hemoglobin: 12.2 g/dL (ref 12.0–15.0)
Immature Granulocytes: 0 %
Lymphocytes Relative: 50 %
Lymphs Abs: 4.2 10*3/uL — ABNORMAL HIGH (ref 0.7–4.0)
MCH: 28.5 pg (ref 26.0–34.0)
MCHC: 32.6 g/dL (ref 30.0–36.0)
MCV: 87.4 fL (ref 80.0–100.0)
Monocytes Absolute: 0.7 10*3/uL (ref 0.1–1.0)
Monocytes Relative: 8 %
Neutro Abs: 3.3 10*3/uL (ref 1.7–7.7)
Neutrophils Relative %: 40 %
Platelets: 264 10*3/uL (ref 150–400)
RBC: 4.28 MIL/uL (ref 3.87–5.11)
RDW: 14.9 % (ref 11.5–15.5)
WBC: 8.3 10*3/uL (ref 4.0–10.5)
nRBC: 0 % (ref 0.0–0.2)

## 2021-06-11 LAB — SARS CORONAVIRUS 2 (TAT 6-24 HRS): SARS Coronavirus 2: NEGATIVE

## 2021-06-11 LAB — COMPREHENSIVE METABOLIC PANEL
ALT: 11 U/L (ref 0–44)
AST: 19 U/L (ref 15–41)
Albumin: 4.1 g/dL (ref 3.5–5.0)
Alkaline Phosphatase: 81 U/L (ref 38–126)
Anion gap: 11 (ref 5–15)
BUN: 9 mg/dL (ref 8–23)
CO2: 25 mmol/L (ref 22–32)
Calcium: 9.8 mg/dL (ref 8.9–10.3)
Chloride: 103 mmol/L (ref 98–111)
Creatinine, Ser: 0.72 mg/dL (ref 0.44–1.00)
GFR, Estimated: >60 mL/min (ref >60)
Glucose, Bld: 101 mg/dL — ABNORMAL HIGH (ref 70–99)
Potassium: 3.2 mmol/L — ABNORMAL LOW (ref 3.5–5.1)
Sodium: 139 mmol/L (ref 135–145)
Total Bilirubin: 0.5 mg/dL (ref 0.3–1.2)
Total Protein: 8.3 g/dL — ABNORMAL HIGH (ref 6.5–8.1)

## 2021-06-11 LAB — APTT: aPTT: 36 seconds (ref 24–36)

## 2021-06-11 LAB — TYPE AND SCREEN
ABO/RH(D): A POS
Antibody Screen: NEGATIVE

## 2021-06-11 LAB — PROTIME-INR
INR: 0.9 (ref 0.8–1.2)
Prothrombin Time: 12.6 seconds (ref 11.4–15.2)

## 2021-06-11 SURGERY — ARTHROPLASTY, HIP, TOTAL, ANTERIOR APPROACH
Anesthesia: Spinal | Site: Hip | Laterality: Right

## 2021-06-11 MED ORDER — SODIUM CHLORIDE 0.9 % IV SOLN: INTRAVENOUS | Status: DC

## 2021-06-11 MED ORDER — HYDROMORPHONE HCL 1 MG/ML IJ SOLN
INTRAMUSCULAR | Status: AC
  Filled 2021-06-11: qty 1

## 2021-06-11 MED ORDER — TRANEXAMIC ACID-NACL 1000-0.7 MG/100ML-% IV SOLN
1000.0000 mg | INTRAVENOUS | Status: AC
  Administered 2021-06-11: 1000 mg via INTRAVENOUS
  Filled 2021-06-11: qty 100

## 2021-06-11 MED ORDER — PHENYLEPHRINE HCL-NACL 10-0.9 MG/250ML-% IV SOLN
INTRAVENOUS | Status: DC | PRN
  Administered 2021-06-11: 25 ug/min via INTRAVENOUS

## 2021-06-11 MED ORDER — PROPOFOL 500 MG/50ML IV EMUL
INTRAVENOUS | Status: DC | PRN
  Administered 2021-06-11: 50 ug/kg/min via INTRAVENOUS

## 2021-06-11 MED ORDER — OXYCODONE HCL 5 MG PO TABS
10.0000 mg | ORAL_TABLET | ORAL | Status: DC | PRN
  Administered 2021-06-11: 15 mg via ORAL
  Administered 2021-06-11: 10 mg via ORAL
  Administered 2021-06-12 – 2021-06-15 (×16): 15 mg via ORAL
  Filled 2021-06-11 (×3): qty 3
  Filled 2021-06-11: qty 2
  Filled 2021-06-11 (×14): qty 3

## 2021-06-11 MED ORDER — BUPIVACAINE LIPOSOME 1.3 % IJ SUSP
INTRAMUSCULAR | Status: DC | PRN
  Administered 2021-06-11: 10 mL

## 2021-06-11 MED ORDER — ASPIRIN EC 325 MG PO TBEC
325.0000 mg | DELAYED_RELEASE_TABLET | Freq: Two times a day (BID) | ORAL | Status: DC
  Administered 2021-06-12 – 2021-06-15 (×7): 325 mg via ORAL
  Filled 2021-06-11 (×7): qty 1

## 2021-06-11 MED ORDER — PROPOFOL 10 MG/ML IV BOLUS
INTRAVENOUS | Status: DC | PRN
  Administered 2021-06-11 (×2): 20 mg via INTRAVENOUS

## 2021-06-11 MED ORDER — PHENYLEPHRINE 40 MCG/ML (10ML) SYRINGE FOR IV PUSH (FOR BLOOD PRESSURE SUPPORT)
PREFILLED_SYRINGE | INTRAVENOUS | Status: AC
  Filled 2021-06-11: qty 10

## 2021-06-11 MED ORDER — HYDROMORPHONE HCL 1 MG/ML IJ SOLN
1.0000 mg | INTRAMUSCULAR | Status: AC | PRN
  Administered 2021-06-11 (×2): 1 mg via INTRAVENOUS

## 2021-06-11 MED ORDER — MIDAZOLAM HCL 2 MG/2ML IJ SOLN
INTRAMUSCULAR | Status: AC
  Filled 2021-06-11: qty 2

## 2021-06-11 MED ORDER — PHENOL 1.4 % MT LIQD: 1.0000 | OROMUCOSAL | Status: DC | PRN

## 2021-06-11 MED ORDER — CEFAZOLIN SODIUM-DEXTROSE 2-4 GM/100ML-% IV SOLN
2.0000 g | INTRAVENOUS | Status: AC
  Administered 2021-06-11: 2 g via INTRAVENOUS
  Filled 2021-06-11: qty 100

## 2021-06-11 MED ORDER — CEFAZOLIN SODIUM-DEXTROSE 2-4 GM/100ML-% IV SOLN
2.0000 g | Freq: Four times a day (QID) | INTRAVENOUS | Status: AC
Start: 2021-06-11 — End: 2021-06-12
  Administered 2021-06-11 – 2021-06-12 (×2): 2 g via INTRAVENOUS
  Filled 2021-06-11 (×2): qty 100

## 2021-06-11 MED ORDER — BUPIVACAINE-EPINEPHRINE (PF) 0.25% -1:200000 IJ SOLN
INTRAMUSCULAR | Status: AC
  Filled 2021-06-11: qty 30

## 2021-06-11 MED ORDER — METHOCARBAMOL 500 MG PO TABS
500.0000 mg | ORAL_TABLET | Freq: Four times a day (QID) | ORAL | Status: DC | PRN
  Administered 2021-06-11 – 2021-06-15 (×9): 500 mg via ORAL
  Filled 2021-06-11 (×9): qty 1

## 2021-06-11 MED ORDER — DEXAMETHASONE SODIUM PHOSPHATE 10 MG/ML IJ SOLN
10.0000 mg | Freq: Two times a day (BID) | INTRAMUSCULAR | Status: AC
  Administered 2021-06-12 – 2021-06-13 (×3): 10 mg via INTRAVENOUS
  Filled 2021-06-11 (×3): qty 1

## 2021-06-11 MED ORDER — ALBUMIN HUMAN 5 % IV SOLN
INTRAVENOUS | Status: AC
  Filled 2021-06-11: qty 250

## 2021-06-11 MED ORDER — ONDANSETRON HCL 4 MG/2ML IJ SOLN
4.0000 mg | Freq: Once | INTRAMUSCULAR | Status: DC | PRN
Start: 2021-06-11 — End: 2021-06-11

## 2021-06-11 MED ORDER — ONDANSETRON HCL 4 MG PO TABS: 4.0000 mg | ORAL_TABLET | Freq: Four times a day (QID) | ORAL | Status: DC | PRN

## 2021-06-11 MED ORDER — ALUM & MAG HYDROXIDE-SIMETH 200-200-20 MG/5ML PO SUSP: 30.0000 mL | ORAL | Status: DC | PRN

## 2021-06-11 MED ORDER — FENTANYL CITRATE (PF) 100 MCG/2ML IJ SOLN
50.0000 ug | INTRAMUSCULAR | Status: AC | PRN
  Administered 2021-06-11: 50 ug via INTRAVENOUS

## 2021-06-11 MED ORDER — MIDAZOLAM HCL 2 MG/2ML IJ SOLN
INTRAMUSCULAR | Status: DC | PRN
  Administered 2021-06-11: 1 mg via INTRAVENOUS
  Administered 2021-06-11: 2 mg via INTRAVENOUS

## 2021-06-11 MED ORDER — MENTHOL 3 MG MT LOZG: 1.0000 | LOZENGE | OROMUCOSAL | Status: DC | PRN

## 2021-06-11 MED ORDER — ALBUMIN HUMAN 5 % IV SOLN: INTRAVENOUS | Status: DC | PRN

## 2021-06-11 MED ORDER — POVIDONE-IODINE 10 % EX SWAB
2.0000 "application " | Freq: Once | CUTANEOUS | Status: AC
  Administered 2021-06-11: 2 via TOPICAL

## 2021-06-11 MED ORDER — ACETAMINOPHEN 325 MG PO TABS
325.0000 mg | ORAL_TABLET | Freq: Four times a day (QID) | ORAL | Status: DC | PRN
  Administered 2021-06-12 – 2021-06-13 (×5): 650 mg via ORAL
  Filled 2021-06-11 (×5): qty 2

## 2021-06-11 MED ORDER — BUPIVACAINE-EPINEPHRINE 0.25% -1:200000 IJ SOLN
INTRAMUSCULAR | Status: DC | PRN
  Administered 2021-06-11: 30 mL

## 2021-06-11 MED ORDER — FENTANYL CITRATE (PF) 100 MCG/2ML IJ SOLN
INTRAMUSCULAR | Status: AC
  Filled 2021-06-11: qty 4

## 2021-06-11 MED ORDER — ONDANSETRON HCL 4 MG/2ML IJ SOLN
INTRAMUSCULAR | Status: DC | PRN
  Administered 2021-06-11: 4 mg via INTRAVENOUS

## 2021-06-11 MED ORDER — ONDANSETRON HCL 4 MG/2ML IJ SOLN
4.0000 mg | Freq: Four times a day (QID) | INTRAMUSCULAR | Status: DC | PRN
  Administered 2021-06-11 – 2021-06-12 (×2): 4 mg via INTRAVENOUS
  Filled 2021-06-11 (×2): qty 2

## 2021-06-11 MED ORDER — BISACODYL 5 MG PO TBEC: 5.0000 mg | DELAYED_RELEASE_TABLET | Freq: Every day | ORAL | Status: DC | PRN

## 2021-06-11 MED ORDER — METHOCARBAMOL 500 MG IVPB - SIMPLE MED
INTRAVENOUS | Status: AC
  Filled 2021-06-11: qty 50

## 2021-06-11 MED ORDER — FENTANYL CITRATE (PF) 100 MCG/2ML IJ SOLN
25.0000 ug | INTRAMUSCULAR | Status: DC | PRN
  Administered 2021-06-11 (×3): 50 ug via INTRAVENOUS

## 2021-06-11 MED ORDER — MAGNESIUM CITRATE PO SOLN: 1.0000 | Freq: Once | ORAL | Status: DC | PRN

## 2021-06-11 MED ORDER — FENTANYL CITRATE (PF) 100 MCG/2ML IJ SOLN
INTRAMUSCULAR | Status: AC
  Administered 2021-06-11: 50 ug via INTRAVENOUS
  Filled 2021-06-11: qty 2

## 2021-06-11 MED ORDER — ACETAMINOPHEN 10 MG/ML IV SOLN
INTRAVENOUS | Status: AC
  Filled 2021-06-11: qty 100

## 2021-06-11 MED ORDER — METOCLOPRAMIDE HCL 5 MG PO TABS: 5.0000 mg | ORAL_TABLET | Freq: Three times a day (TID) | ORAL | Status: DC | PRN

## 2021-06-11 MED ORDER — CHLORHEXIDINE GLUCONATE 0.12 % MT SOLN
15.0000 mL | Freq: Once | OROMUCOSAL | Status: AC
  Administered 2021-06-11: 15 mL via OROMUCOSAL

## 2021-06-11 MED ORDER — SODIUM CHLORIDE 0.9 % IR SOLN
Status: DC | PRN
  Administered 2021-06-11: 1000 mL

## 2021-06-11 MED ORDER — OXYCODONE HCL 5 MG PO TABS
5.0000 mg | ORAL_TABLET | ORAL | Status: DC | PRN
  Administered 2021-06-14: 10 mg via ORAL
  Filled 2021-06-11: qty 2

## 2021-06-11 MED ORDER — POLYETHYLENE GLYCOL 3350 17 G PO PACK
17.0000 g | PACK | Freq: Every day | ORAL | Status: DC | PRN
  Administered 2021-06-14: 17 g via ORAL
  Filled 2021-06-11: qty 1

## 2021-06-11 MED ORDER — DOCUSATE SODIUM 100 MG PO CAPS
100.0000 mg | ORAL_CAPSULE | Freq: Two times a day (BID) | ORAL | Status: DC
  Administered 2021-06-11 – 2021-06-15 (×8): 100 mg via ORAL
  Filled 2021-06-11 (×8): qty 1

## 2021-06-11 MED ORDER — METOCLOPRAMIDE HCL 5 MG/ML IJ SOLN: 5.0000 mg | Freq: Three times a day (TID) | INTRAMUSCULAR | Status: DC | PRN

## 2021-06-11 MED ORDER — ORAL CARE MOUTH RINSE: 15.0000 mL | Freq: Once | OROMUCOSAL | Status: AC

## 2021-06-11 MED ORDER — HYDROMORPHONE HCL 1 MG/ML IJ SOLN
0.5000 mg | INTRAMUSCULAR | Status: DC | PRN
  Administered 2021-06-11 – 2021-06-14 (×9): 1 mg via INTRAVENOUS
  Filled 2021-06-11 (×9): qty 1

## 2021-06-11 MED ORDER — WATER FOR IRRIGATION, STERILE IR SOLN
Status: DC | PRN
  Administered 2021-06-11: 2000 mL

## 2021-06-11 MED ORDER — FENTANYL CITRATE (PF) 100 MCG/2ML IJ SOLN
INTRAMUSCULAR | Status: AC
  Filled 2021-06-11: qty 2

## 2021-06-11 MED ORDER — DIPHENHYDRAMINE HCL 12.5 MG/5ML PO ELIX
12.5000 mg | ORAL_SOLUTION | ORAL | Status: DC | PRN
  Administered 2021-06-11 – 2021-06-12 (×2): 25 mg via ORAL
  Filled 2021-06-11 (×2): qty 10

## 2021-06-11 MED ORDER — PROPOFOL 1000 MG/100ML IV EMUL
INTRAVENOUS | Status: AC
  Filled 2021-06-11: qty 100

## 2021-06-11 MED ORDER — ACETAMINOPHEN 10 MG/ML IV SOLN
1000.0000 mg | Freq: Once | INTRAVENOUS | Status: AC
  Administered 2021-06-11: 1000 mg via INTRAVENOUS

## 2021-06-11 MED ORDER — LACTATED RINGERS IV SOLN: INTRAVENOUS | Status: DC

## 2021-06-11 MED ORDER — DEXAMETHASONE SODIUM PHOSPHATE 10 MG/ML IJ SOLN: INTRAMUSCULAR | Status: DC | PRN

## 2021-06-11 MED ORDER — BUPIVACAINE IN DEXTROSE 0.75-8.25 % IT SOLN
INTRATHECAL | Status: DC | PRN
  Administered 2021-06-11: 1.5 mL via INTRATHECAL

## 2021-06-11 MED ORDER — FENTANYL CITRATE (PF) 100 MCG/2ML IJ SOLN
INTRAMUSCULAR | Status: DC | PRN
  Administered 2021-06-11: 100 ug via INTRAVENOUS

## 2021-06-11 MED ORDER — TRANEXAMIC ACID-NACL 1000-0.7 MG/100ML-% IV SOLN
1000.0000 mg | Freq: Once | INTRAVENOUS | Status: AC
  Administered 2021-06-11: 1000 mg via INTRAVENOUS
  Filled 2021-06-11: qty 100

## 2021-06-11 MED ORDER — METHOCARBAMOL 500 MG IVPB - SIMPLE MED
500.0000 mg | Freq: Four times a day (QID) | INTRAVENOUS | Status: DC | PRN
  Administered 2021-06-11: 500 mg via INTRAVENOUS
  Filled 2021-06-11: qty 50

## 2021-06-11 SURGICAL SUPPLY — 41 items
BAG COUNTER SPONGE SURGICOUNT (BAG) IMPLANT
BAG ZIPLOCK 12X15 (MISCELLANEOUS) IMPLANT
BENZOIN TINCTURE PRP APPL 2/3 (GAUZE/BANDAGES/DRESSINGS) IMPLANT
BLADE SAW SGTL 18X1.27X75 (BLADE) ×2 IMPLANT
BLADE SURG SZ10 CARB STEEL (BLADE) ×4 IMPLANT
COVER PERINEAL POST (MISCELLANEOUS) ×2 IMPLANT
COVER SURGICAL LIGHT HANDLE (MISCELLANEOUS) ×2 IMPLANT
DECANTER SPIKE VIAL GLASS SM (MISCELLANEOUS) ×2 IMPLANT
DRAPE FOOT SWITCH (DRAPES) ×2 IMPLANT
DRAPE STERI IOBAN 125X83 (DRAPES) ×2 IMPLANT
DRAPE U-SHAPE 47X51 STRL (DRAPES) ×4 IMPLANT
DRSG AQUACEL AG ADV 3.5X 6 (GAUZE/BANDAGES/DRESSINGS) ×2 IMPLANT
DURAPREP 26ML APPLICATOR (WOUND CARE) ×2 IMPLANT
ELECT BLADE TIP CTD 4 INCH (ELECTRODE) ×2 IMPLANT
ELECT REM PT RETURN 15FT ADLT (MISCELLANEOUS) ×2 IMPLANT
ELIMINATOR HOLE APEX DEPUY (Hips) ×2 IMPLANT
GAUZE XEROFORM 1X8 LF (GAUZE/BANDAGES/DRESSINGS) IMPLANT
GLOVE SRG 8 PF TXTR STRL LF DI (GLOVE) ×2 IMPLANT
GLOVE SURG LTX SZ7.5 (GLOVE) ×4 IMPLANT
GLOVE SURG UNDER POLY LF SZ8 (GLOVE) ×4
GOWN STRL REUS W/TWL XL LVL3 (GOWN DISPOSABLE) ×4 IMPLANT
HEAD FEM STD 32X+1 STRL (Hips) ×2 IMPLANT
HOLDER FOLEY CATH W/STRAP (MISCELLANEOUS) ×2 IMPLANT
HOOD PEEL AWAY FLYTE STAYCOOL (MISCELLANEOUS) ×4 IMPLANT
KIT TURNOVER KIT A (KITS) ×2 IMPLANT
LINER ACETABULAR 32X50 (Liner) ×2 IMPLANT
NEEDLE HYPO 22GX1.5 SAFETY (NEEDLE) ×2 IMPLANT
PACK ANTERIOR HIP CUSTOM (KITS) ×2 IMPLANT
PENCIL SMOKE EVACUATOR (MISCELLANEOUS) IMPLANT
PIN SECT CUP 50MM (Hips) ×2 IMPLANT
STAPLER VISISTAT 35W (STAPLE) IMPLANT
STEM CORAIL KA10 (Stem) ×2 IMPLANT
STRIP CLOSURE SKIN 1/2X4 (GAUZE/BANDAGES/DRESSINGS) IMPLANT
SUT ETHIBOND NAB CT1 #1 30IN (SUTURE) ×4 IMPLANT
SUT MNCRL AB 3-0 PS2 18 (SUTURE) IMPLANT
SUT VIC AB 0 CT1 36 (SUTURE) ×2 IMPLANT
SUT VIC AB 1 CT1 36 (SUTURE) ×2 IMPLANT
SUT VIC AB 2-0 CT1 27 (SUTURE) ×2
SUT VIC AB 2-0 CT1 TAPERPNT 27 (SUTURE) ×1 IMPLANT
TAPE STRIPS DRAPE STRL (GAUZE/BANDAGES/DRESSINGS) ×2 IMPLANT
TRAY FOLEY MTR SLVR 16FR STAT (SET/KITS/TRAYS/PACK) ×2 IMPLANT

## 2021-06-11 NOTE — Transfer of Care (Signed)
Immediate Anesthesia Transfer of Care Note  Patient: Ellen Henry  Procedure(s) Performed: TOTAL HIP ARTHROPLASTY ANTERIOR APPROACH (Right: Hip)  Patient Location: PACU  Anesthesia Type:General  Level of Consciousness: awake and alert   Airway & Oxygen Therapy: Patient Spontanous Breathing and Patient connected to face mask oxygen  Post-op Assessment: Report given to RN and Post -op Vital signs reviewed and stable  Post vital signs: Reviewed and stable  Last Vitals:  Vitals Value Taken Time  BP 96/77 06/11/21 1647  Temp    Pulse 73 06/11/21 1650  Resp 18 06/11/21 1650  SpO2 100 % 06/11/21 1650  Vitals shown include unvalidated device data.  Last Pain:  Vitals:   06/11/21 1348  TempSrc:   PainSc: 9       Patients Stated Pain Goal: 9 (44/97/53 0051)  Complications: No notable events documented.

## 2021-06-11 NOTE — Op Note (Signed)
PATIENT ID:      Ellen Henry  MRN:     [id number] DOB/AGE:    12-28-1945 / 75 y.o.       OPERATIVE REPORT    DATE OF PROCEDURE:  06/11/2021       PREOPERATIVE DIAGNOSIS:  RIGHT HIP OSTEOARTHRITIS                                                       Estimated body mass index is 40.9 kg/m as calculated from the following:   Height as of this encounter: 5' (1.524 m).   Weight as of this encounter: 95 kg.     POSTOPERATIVE DIAGNOSIS:  RIGHT HIP OSTEOARTHRITIS                                                           PROCEDURE:  1. right total hip arthroplasty using a 60mm DePuy Pinnacle  Cup, Apex Hole Eliminator,  neutral liner, a +1.5 60mm metal head,  and a #10  Corail stem, 2.interpretation of multiple intraoperative fluoroscopic images   SURGEON: Alta Corning    ASSISTANT:   Filbert Berthold PA-C  (present throughout entire procedure and necessary for timely completion of the procedure)  ANESTHESIA: spinal  BLOOD LOSS: @300cc  Tranexamic Acid: 1 gram IV DRAINS: None COMPLICATIONS: None    NDICATIONS FOR PROCEDURE:Patient with end-stage arthritis of the right hip.  X-rays show bone-on-bone arthritic changes. Despite conservative measures with observation, anti-inflammatory medicine, narcotics, use of a cane, has severe unremitting pain and can ambulate only less than 1 block before resting.  Patient desires elective right total hip arthroplasty to decrease pain and increase function. The risks, benefits, and alternatives were discussed at length including but not limited to the risks of infection, bleeding, nerve injury, stiffness, blood clots, the need for revision surgery, cardiopulmonary complications, among others, and they were willing to proceed.Benefits have been discussed. Questions answered.     PROCEDURE IN DETAIL: The patient was identified by armband,  received preoperative IV antibiotics in the holding area at [facility], taken to the operating room ,  appropriate anesthetic monitors  were attached and spinal anesthesia was induced.  The patient was placed onto the hot bed and all bony prominences were well-padded.The right hip was prepped and draped for an anterior approach to the hip.  An incision was made and the subcutaneous dissection was down to the level of the tensor fascia.  The fascia was opened and finger dissected.  The bleeders coming across the anterior portion of the hip were identified and cauterized. Retractors were put in place above and below the femoral neck.  The capsule was opened and tagged and a provisional neck cut was made.  The head was removed and sized on the back table.  The acetabulum was sequentially reamed to a level of 26mm and a 50 mm porous-coated pinnacle cup was hammered into place with 45 of lateral opening and 30 of anteversion.fluoroscopy was used to ensure this position of the cup.  Attention was turned towards the femur where the leg was actually rotated, extended, and adduction did.  The  femur was sequentially broached until a size of 10broach gave a perfect fit and fill.at this point a  1.5 mm delta metal hip ball was placed and the hip reduced.  Fluoroscopic images were taken to assess the leg length, fit and fill of the stem, and cup position.  We were happy with the construct at this point.  The 10broach was removed and a final Corrail stem with standard offset  and a 1.5 mm metal hip ball was placed and reduced.  Final images were taken to make certain there were happy with the position at this point.   The capsule was closed with #1 Vicryl suture.  The tensor fascia was closed with 0 Vicryl suture.  The skin was then closed with combination of 0 and 2-0 Vicryl suture.  The top layer was with 3-0 Monocryl suture.  Benzoin and Steri-Strips were applied  and a sterile compressive dressing was applied and the patient taken to recovery room she noted be in satisfactory condition.  Past medical Motion for the  procedure was approximately 300 cc.  Of note layer Herbie Baltimore she was with the entire case and assisted by retraction of tissues, manipulation of the leg, and closing the minimize or time. Filbert Berthold was present for the entire case and assisted by retraction of tissues manipulation of there leg and closing to minimize OR time    Alta Corning 06/01/2021, 6:15 PM  Alta Corning 06/11/2021, 4:21 PM

## 2021-06-11 NOTE — Anesthesia Procedure Notes (Signed)
Date/Time: 06/11/2021 2:23 PM Performed by: Cynda Familia, CRNA Oxygen Delivery Method: Simple face mask Placement Confirmation: positive ETCO2 and breath sounds checked- equal and bilateral Dental Injury: Teeth and Oropharynx as per pre-operative assessment

## 2021-06-11 NOTE — H&P (Signed)
TOTAL HIP ADMISSION H&P  Patient is admitted for right total hip arthroplasty.  Subjective:  Chief Complaint: right hip pain  HPI: Ellen Henry, 75 y.o. female, has a history of pain and functional disability in the right hip(s) due to arthritis and patient has failed non-surgical conservative treatments for greater than 12 weeks to include NSAID's and/or analgesics, viscosupplementation injections, flexibility and strengthening excercises, weight reduction as appropriate, and activity modification.  Onset of symptoms was gradual starting 5 years ago with rapidlly worsening course since that time.The patient noted no past surgery on the right hip(s).  Patient currently rates pain in the right hip at 10 out of 10 with activity. Patient has night pain, worsening of pain with activity and weight bearing, trendelenberg gait, pain that interfers with activities of daily living, pain with passive range of motion, and crepitus. Patient has evidence of subchondral cysts, subchondral sclerosis, periarticular osteophytes, joint subluxation, and joint space narrowing by imaging studies. This condition presents safety issues increasing the risk of falls. This patient has had  failure of all reasonable conservative care .  There is no current active infection.  Patient Active Problem List   Diagnosis Date Noted   Complete tear of right rotator cuff 01/18/2018   Impingement syndrome of right shoulder 01/18/2018   AC (acromioclavicular) arthritis 01/18/2018   Rotator cuff tear 01/18/2018   Low oxygen saturation 12/09/2017   Chronic constipation    Failed back syndrome    Chronic pain syndrome    Radicular pain    Chronic bilateral low back pain without sciatica    Dysphagia, post-stroke    Left hemiparesis (HCC)    Acute ischemic stroke ([place]) 12/15/2016   Urinary retention 02/25/2015   Primary osteoarthritis of left knee 02/15/2015   Morbid obesity ([place]) 02/15/2015   Essential hypertension, benign  09/07/2014   Hyperlipidemia 09/05/2014   Gout 09/05/2014   Neuropathy 09/05/2014   Radiculopathy 08/29/2014   Past Medical History:  Diagnosis Date   Acute kidney failure due to procedure    Arthritis    Complication of anesthesia    after nerve block difficult breathing (at [facility] [address] transported [facility])   Dry skin    GERD (gastroesophageal reflux disease)    "years ago", diet controlled   Gout    H/O pyelonephritis    as a child   Heart murmur    since birth, denies any problems    History of sepsis    after spinal surgery   Hypertension    Impingement syndrome of right shoulder    Neuropathy    Osteoarthritis of AC (acromioclavicular) joint    Right   Pneumonia    Radiculopathy    Rotator cuff tear, right    Sleep apnea    does not use cpap   Stroke ([place]) 12/15/2016   left side weakness    Past Surgical History:  Procedure Laterality Date   ABDOMINAL EXPOSURE N/A 08/29/2014   Procedure: ABDOMINAL EXPOSURE;  Surgeon: Rosetta Posner, MD;  Location: [facility];  Service: Vascular;  Laterality: N/A;   ABDOMINAL HYSTERECTOMY     ANTERIOR LUMBAR FUSION N/A 08/29/2014   Procedure: ANTERIOR LUMBAR FUSION 1 LEVEL;  Surgeon: Sinclair Ship, MD;  Location: [facility];  Service: Orthopedics;  Laterality: N/A;  Lumbar 4-5 anterior lumbar interbody fusion with allograft and instrumentation.   APPENDECTOMY     BACK SURGERY  08/28/2014 and 08/29/2014   lumbar fusion   CARPAL TUNNEL RELEASE Right  release done twice   COLONOSCOPY     JOINT REPLACEMENT     left knee replacement     lower intestine removal     REPLACEMENT TOTAL KNEE  2010   rigth knee   SHOULDER ARTHROSCOPY Right 01/18/2018   Procedure: RIGHT SHOULDER ARTHROSCOPY, SUBACROMIAL DECOMPRESSION, DISTAL CLAVICLE RESECTION WITH MINI OPEN ROTATOR CUFF REPAIR;  Surgeon: Garald Balding, MD;  Location: [facility];  Service: Orthopedics;  Laterality: Right;   TONSILLECTOMY     TOTAL KNEE ARTHROPLASTY Left  02/15/2015   Procedure: TOTAL KNEE ARTHROPLASTY;  Surgeon: [name], MD;  Location: [facility];  Service: Orthopedics;  Laterality: Left;    Current Facility-Administered Medications  Medication Dose Route Frequency Provider Last Rate Last Admin   bupivacaine liposome (EXPAREL) 1.3 % injection 133 mg  10 mL Other Once [name], MD       Derrill Memo ON 06/12/2021] ceFAZolin (ANCEF) IVPB 2g/100 mL premix  2 g Intravenous On Call to OR [name], MD       lactated ringers infusion   Intravenous Continuous Lyn Hollingshead, MD 10 mL/hr at 06/11/21 1334 Continued from Pre-op at 06/11/21 1334   tranexamic acid (CYKLOKAPRON) IVPB 1,000 mg  1,000 mg Intravenous To OR [name], MD       Allergies  Allergen Reactions   Food Allergy Formula Other (See Comments)    Shellfish-banana-beef-flares up her gout    Social History   Tobacco Use   Smoking status: Former    Packs/day: 0.50    Pack years: 0.00    Types: Cigarettes    Quit date: 04/04/2021    Years since quitting: 0.1   Smokeless tobacco: Never  Substance Use Topics   Alcohol use: No    Family History  Problem Relation Age of Onset   Varicose Veins Mother      Review of Systems ROS: I have reviewed the patient's review of systems thoroughly and there are no positive responses as relates to the HPI.  Objective:  Physical Exam  Vital signs in last 24 hours: Temp:  [98.4 F (36.9 C)] 98.4 F (36.9 C) (07/06 1227) Pulse Rate:  [77] 77 (07/06 1227) Resp:  [18] 18 (07/06 1227) BP: (98)/(60) 98/60 (07/06 1227) SpO2:  [98 %] 98 % (07/06 1227) Weight:  [95 kg] 95 kg (07/06 1202) Well-developed well-nourished patient in no acute distress. Alert and oriented x3 HEENT:within normal limits Cardiac: Regular rate and rhythm Pulmonary: Lungs clear to auscultation Abdomen: Soft and nontender.  Normal active bowel sounds  Musculoskeletal: Right hip: Painful range of motion.  Limited range of motion.  Neuro vas intact distally.   Minimal internal and external rotation. Labs: Recent Results (from the past 2160 hour(s))  Surgical pcr screen     Status: None   Collection Time: 06/06/21  2:10 PM   Specimen: Nasal Mucosa; Nasal Swab  Result Value Ref Range   MRSA, PCR NEGATIVE NEGATIVE   Staphylococcus aureus NEGATIVE NEGATIVE    Comment: (NOTE) The Xpert SA Assay (FDA approved for NASAL specimens in patients 62 years of age and older), is one component of a comprehensive surveillance program. It is not intended to diagnose infection nor to guide or monitor treatment. Performed at [facility], [place] [address], [place], [place] [address]   Urinalysis, Routine w reflex microscopic Urine, Clean Catch     Status: Abnormal   Collection Time: 06/06/21  2:10 PM  Result Value Ref Range   Color, Urine STRAW (A)  YELLOW   APPearance CLEAR CLEAR   Specific Gravity, Urine 1.006 1.005 - 1.030   pH 7.0 5.0 - 8.0   Glucose, UA NEGATIVE NEGATIVE mg/dL   Hgb urine dipstick NEGATIVE NEGATIVE   Bilirubin Urine NEGATIVE NEGATIVE   Ketones, ur NEGATIVE NEGATIVE mg/dL   Protein, ur NEGATIVE NEGATIVE mg/dL   Nitrite NEGATIVE NEGATIVE   Leukocytes,Ua NEGATIVE NEGATIVE    Comment: Performed at [facility] [address] [place], [place] [address]  SARS CORONAVIRUS 2 (TAT 6-24 HRS) Nasopharyngeal Nasopharyngeal Swab     Status: None   Collection Time: 06/10/21  2:40 PM   Specimen: Nasopharyngeal Swab  Result Value Ref Range   SARS Coronavirus 2 NEGATIVE NEGATIVE    Comment: (NOTE) SARS-CoV-2 target nucleic acids are NOT DETECTED.  The SARS-CoV-2 RNA is generally detectable in upper and lower respiratory specimens during the acute phase of infection. Negative results do not preclude SARS-CoV-2 infection, do not rule out co-infections with other pathogens, and should not be used as the sole basis for treatment or other patient management decisions. Negative results must be combined with  clinical observations, patient history, and epidemiological information. The expected result is Negative.  Fact Sheet for Patients: SugarRoll.be  Fact Sheet for Healthcare Providers: https://www.woods-mathews.com/  This test is not yet approved or cleared by the [place] FDA and  has been authorized for detection and/or diagnosis of SARS-CoV-2 by FDA under an Emergency Use Authorization (EUA). This EUA will remain  in effect (meaning this test can be used) for the duration of the COVID-19 declaration under Se ction 564(b)(1) of the Act, 21 U.S.C. section 360bbb-3(b)(1), unless the authorization is terminated or revoked sooner.  Performed at [facility], [place] [address] [place], [place] [address]   CBC with Differential/Platelet     Status: Abnormal   Collection Time: 06/11/21 12:30 PM  Result Value Ref Range   WBC 8.3 4.0 - 10.5 K/uL   RBC 4.28 3.87 - 5.11 MIL/uL   Hemoglobin 12.2 12.0 - 15.0 g/dL   HCT 37.4 36.0 - 46.0 %   MCV 87.4 80.0 - 100.0 fL   MCH 28.5 26.0 - 34.0 pg   MCHC 32.6 30.0 - 36.0 g/dL   RDW 14.9 11.5 - 15.5 %   Platelets 264 150 - 400 K/uL   nRBC 0.0 0.0 - 0.2 %   Neutrophils Relative % 40 %   Neutro Abs 3.3 1.7 - 7.7 K/uL   Lymphocytes Relative 50 %   Lymphs Abs 4.2 (H) 0.7 - 4.0 K/uL   Monocytes Relative 8 %   Monocytes Absolute 0.7 0.1 - 1.0 K/uL   Eosinophils Relative 1 %   Eosinophils Absolute 0.1 0.0 - 0.5 K/uL   Basophils Relative 1 %   Basophils Absolute 0.1 0.0 - 0.1 K/uL   Immature Granulocytes 0 %   Abs Immature Granulocytes 0.02 0.00 - 0.07 K/uL    Comment: Performed at [facility], [place] [address], [place], [place] [address]  Comprehensive metabolic panel     Status: Abnormal   Collection Time: 06/11/21 12:30 PM  Result Value Ref Range   Sodium 139 135 - 145 mmol/L   Potassium 3.2 (L) 3.5 - 5.1 mmol/L   Chloride 103 98 - 111 mmol/L   CO2 25 22 - 32 mmol/L    Glucose, Bld 101 (H) 70 - 99 mg/dL    Comment: Glucose reference range applies only to samples taken after fasting for at least  8 hours.   BUN 9 8 - 23 mg/dL   Creatinine, Ser 0.72 0.44 - 1.00 mg/dL   Calcium 9.8 8.9 - 10.3 mg/dL   Total Protein 8.3 (H) 6.5 - 8.1 g/dL   Albumin 4.1 3.5 - 5.0 g/dL   AST 19 15 - 41 U/L   ALT 11 0 - 44 U/L   Alkaline Phosphatase 81 38 - 126 U/L   Total Bilirubin 0.5 0.3 - 1.2 mg/dL   GFR, Estimated >60 >60 mL/min    Comment: (NOTE) Calculated using the CKD-EPI Creatinine Equation (2021)    Anion gap 11 5 - 15    Comment: Performed at [facility], [place] [address], [place], [place] [address]  Protime-INR     Status: None   Collection Time: 06/11/21 12:30 PM  Result Value Ref Range   Prothrombin Time 12.6 11.4 - 15.2 seconds   INR 0.9 0.8 - 1.2    Comment: (NOTE) INR goal varies based on device and disease states. Performed at [facility], [place] [address] [place], [place] [address]   APTT     Status: None   Collection Time: 06/11/21 12:30 PM  Result Value Ref Range   aPTT 36 24 - 36 seconds    Comment: Performed at [facility], [place] [address] [place], [place] [address]  Type and screen [place]     Status: None   Collection Time: 06/11/21 12:30 PM  Result Value Ref Range   ABO/RH(D) A POS    Antibody Screen NEG    Sample Expiration      06/14/2021,2359 Performed at [facility] [address], [place], [place] [address]      Estimated body mass index is 40.9 kg/m as calculated from the following:   Height as of this encounter: 5' (1.524 m).   Weight as of this encounter: 95 kg.   Imaging Review Plain radiographs demonstrate severe degenerative joint disease of the right hip(s). The bone quality appears to be fair for age and reported activity level.      Assessment/Plan:  End stage arthritis, right hip(s)  The patient history,  physical examination, clinical judgement of the provider and imaging studies are consistent with end stage degenerative joint disease of the right hip(s) and total hip arthroplasty is deemed medically necessary. The treatment options including medical management, injection therapy, arthroscopy and arthroplasty were discussed at length. The risks and benefits of total hip arthroplasty were presented and reviewed. The risks due to aseptic loosening, infection, stiffness, dislocation/subluxation,  thromboembolic complications and other imponderables were discussed.  The patient acknowledged the explanation, agreed to proceed with the plan and consent was signed. Patient is being admitted for inpatient treatment for surgery, pain control, PT, OT, prophylactic antibiotics, VTE prophylaxis, progressive ambulation and ADL's and discharge planning.The patient is planning to be discharged home with home health services

## 2021-06-11 NOTE — Anesthesia Procedure Notes (Signed)
Spinal  Patient location during procedure: OR Start time: 06/11/2021 2:29 PM Reason for block: surgical anesthesia Staffing Performed: resident/CRNA  Resident/CRNA: [place] Laurette Villescas C, CRNA Preanesthetic Checklist Completed: patient identified, IV checked, site marked, risks and benefits discussed, surgical consent, monitors and equipment checked, pre-op evaluation and timeout performed Spinal Block Patient position: sitting Prep: DuraPrep and site prepped and draped Patient monitoring: heart rate, cardiac monitor, continuous pulse ox and blood pressure Approach: midline Location: L3-4 Injection technique: single-shot Needle Needle type: Pencan  Needle gauge: 24 G Needle length: 9 cm Assessment Sensory level: T4 Events: CSF return Additional Notes IV functioning, monitors applied to pt. Expiration date of kit checked and confirmed to be in date. Sterile prep and drape, hand hygiene and sterile gloved used. Pt was positioned and spine was prepped in sterile fashion. Skin was anesthetized with lidocaine. Free flow of clear CSF obtained prior to injecting local anesthetic into CSF x 1 attempt. Spinal needle aspirated freely following injection. Needle was carefully withdrawn, and pt tolerated procedure well. Loss of motor and sensory on exam post injection.

## 2021-06-11 NOTE — Anesthesia Postprocedure Evaluation (Signed)
Anesthesia Post Note  Patient: Ellen Henry  Procedure(s) Performed: TOTAL HIP ARTHROPLASTY ANTERIOR APPROACH (Right: Hip)     Patient location during evaluation: PACU Anesthesia Type: Spinal Level of consciousness: awake Pain management: pain level controlled Vital Signs Assessment: post-procedure vital signs reviewed and stable Respiratory status: spontaneous breathing Cardiovascular status: stable Postop Assessment: no headache, no backache, spinal receding, patient able to bend at knees and no apparent nausea or vomiting Anesthetic complications: no   No notable events documented.  Last Vitals:  Vitals:   06/11/21 1647 06/11/21 1700  BP: 96/77 121/71  Pulse: 75 73  Resp: 17 20  Temp: (!) 36.4 C   SpO2: 100% 100%    Last Pain:  Vitals:   06/11/21 1707  TempSrc:   PainSc: 6                  John F Salome Arnt

## 2021-06-12 ENCOUNTER — Other Ambulatory Visit: Payer: Self-pay

## 2021-06-12 ENCOUNTER — Encounter (HOSPITAL_COMMUNITY): Payer: Self-pay | Admitting: Orthopedic Surgery

## 2021-06-12 LAB — GLUCOSE, CAPILLARY
Glucose-Capillary: 103 mg/dL — ABNORMAL HIGH (ref 70–99)
Glucose-Capillary: 117 mg/dL — ABNORMAL HIGH (ref 70–99)
Glucose-Capillary: 119 mg/dL — ABNORMAL HIGH (ref 70–99)

## 2021-06-12 LAB — CBC
HCT: 35.4 % — ABNORMAL LOW (ref 36.0–46.0)
Hemoglobin: 11.2 g/dL — ABNORMAL LOW (ref 12.0–15.0)
MCH: 28.5 pg (ref 26.0–34.0)
MCHC: 31.6 g/dL (ref 30.0–36.0)
MCV: 90.1 fL (ref 80.0–100.0)
Platelets: 243 10*3/uL (ref 150–400)
RBC: 3.93 MIL/uL (ref 3.87–5.11)
RDW: 14.8 % (ref 11.5–15.5)
WBC: 12.6 10*3/uL — ABNORMAL HIGH (ref 4.0–10.5)
nRBC: 0 % (ref 0.0–0.2)

## 2021-06-12 NOTE — Progress Notes (Signed)
Physical Therapy Treatment Patient Details Name: Ellen Henry MRN: [id number] DOB: 08/09/1946 Today's Date: 06/12/2021    History of Present Illness 75 y.o. female admitted for R AA-THA on 06/11/21. PMH includes: neuropathy, L hemiparesis, CVA, chronic pain.    PT Comments    +2 mod assist for sit to stand from built up recliner. +2 min A to ambulate ~4' with RW, pain and fatigue limiting distance tolerance. Pt performed THA exercises with min assist. She is slowly progressing with mobility. R hip and L knee pain limit activity tolerance. I do not expect she'll be ready to DC home tomorrow from a PT standpoint.    Follow Up Recommendations  Follow surgeon's recommendation for DC plan and follow-up therapies;Supervision/Assistance - 24 hour     Equipment Recommendations  3in1 (PT)    Recommendations for Other Services       Precautions / Restrictions Precautions Precautions: Fall Precaution Comments: reports 3-4 falls in past year from "getting overbalanced because of my weak L side". Restrictions Weight Bearing Restrictions: No RLE Weight Bearing: Weight bearing as tolerated    Mobility  Bed Mobility Overal bed mobility: Needs Assistance Bed Mobility: Sit to Supine       Sit to supine: Max assist;+2 for physical assistance   General bed mobility comments: assist for LEs into bed and to control trunk descent    Transfers Overall transfer level: Needs assistance Equipment used: Rolling walker (2 wheeled) Transfers: Sit to/from Stand Sit to Stand: +2 safety/equipment;+2 physical assistance;From elevated surface;Mod assist         General transfer comment: +2 mod A to power up from recliner (built up with 2 layers of folded blankets), VCs hand placement  Ambulation/Gait Ambulation/Gait assistance: Min assist;+2 safety/equipment;+2 physical assistance Gait Distance (Feet): 4 Feet Assistive device: Rolling walker (2 wheeled) Gait Pattern/deviations: Step-to  pattern;Decreased step length - right;Decreased step length - left;Antalgic Gait velocity: decr   General Gait Details: increased time and effort, pt took several steps from recliner to bed, distance limited by pain   Stairs             Wheelchair Mobility    Modified Rankin (Stroke Patients Only)       Balance Overall balance assessment: Needs assistance   Sitting balance-Leahy Scale: Fair     Standing balance support: Bilateral upper extremity supported Standing balance-Leahy Scale: Poor                              Cognition Arousal/Alertness: Awake/alert Behavior During Therapy: Anxious Overall Cognitive Status: Within Functional Limits for tasks assessed                                 General Comments: verbal cues for relaxation breathing      Exercises Total Joint Exercises Ankle Circles/Pumps: AROM;Both;10 reps;Supine Heel Slides: AAROM;Right;5 reps;Supine;Both Long Arc Quad: AROM;Both;5 reps;Seated    General Comments        Pertinent Vitals/Pain Pain Score: 7  Pain Location: R hip Pain Descriptors / Indicators: Sore;Grimacing;Guarding Pain Intervention(s): Limited activity within patient's tolerance;Monitored during session;RN gave pain meds during session;Patient requesting pain meds-RN notified    Home Living                      Prior Function            PT Goals (current  goals can now be found in the care plan section) Acute Rehab PT Goals Patient Stated Goal: to be able to walk PT Goal Formulation: With patient Time For Goal Achievement: 06/19/21 Potential to Achieve Goals: Good Progress towards PT goals: Progressing toward goals    Frequency    7X/week      PT Plan Current plan remains appropriate    Co-evaluation              AM-PAC PT "6 Clicks" Mobility   Outcome Measure  Help needed turning from your back to your side while in a flat bed without using bedrails?: A  Lot Help needed moving from lying on your back to sitting on the side of a flat bed without using bedrails?: A Lot Help needed moving to and from a bed to a chair (including a wheelchair)?: A Lot Help needed standing up from a chair using your arms (e.g., wheelchair or bedside chair)?: A Lot Help needed to walk in hospital room?: A Lot Help needed climbing 3-5 steps with a railing? : Total 6 Click Score: 11    End of Session Equipment Utilized During Treatment: Gait belt Activity Tolerance: Patient limited by pain Patient left: with call bell/phone within reach;with SCD's reapplied;in bed;with bed alarm set (lift pad under pt in recliner) Nurse Communication: Mobility status PT Visit Diagnosis: Difficulty in walking, not elsewhere classified (R26.2);Pain Pain - Right/Left: Right Pain - part of body: Hip     Time: 7185-5015 PT Time Calculation (min) (ACUTE ONLY): 20 min  Charges:  $Therapeutic Activity: 8-22 mins                    Blondell Reveal Kistler PT 06/12/2021  Acute Rehabilitation Services Pager [phone] Office [phone]

## 2021-06-12 NOTE — Evaluation (Signed)
Physical Therapy Evaluation Patient Details Name: Ellen Henry MRN: [id number] DOB: [birth year] Today's Date: 06/12/2021   History of Present Illness  75 y.o. female admitted for R AA-THA on 06/11/21. PMH includes: neuropathy, L hemiparesis, CVA, chronic pain.  Clinical Impression  Pt is s/p THA resulting in the deficits listed below (see PT Problem List). +2 max assist for supine to sit, +2 max sit to stand. Stedy lift equipment used for pivot to recliner. R hip pain, L knee pain and L sided weakness are limiting activity tolerance. Pt will benefit from skilled PT to increase their independence and safety with mobility to allow discharge to the venue listed below.      Follow Up Recommendations Follow surgeon's recommendation for DC plan and follow-up therapies;Supervision/Assistance - 24 hour    Equipment Recommendations  3in1 (PT)    Recommendations for Other Services       Precautions / Restrictions Precautions Precautions: Fall Precaution Comments: reports 3-4 falls in past year from "getting overbalanced because of my weak L side". Restrictions Weight Bearing Restrictions: No RLE Weight Bearing: Weight bearing as tolerated      Mobility  Bed Mobility Overal bed mobility: Needs Assistance Bed Mobility: Supine to Sit     Supine to sit: +2 for physical assistance;Max assist     General bed mobility comments: assist to advance and support BLEs and to raise trunk    Transfers Overall transfer level: Needs assistance   Transfers: Sit to/from Stand;Stand Pivot Transfers Sit to Stand: Max assist;+2 safety/equipment;+2 physical assistance;From elevated surface Stand pivot transfers: Total assist       General transfer comment: +2 max assist to rise from elevated bed; used Stedy for SPT  Ambulation/Gait             General Gait Details: unable  Stairs            Wheelchair Mobility    Modified Rankin (Stroke Patients Only)       Balance Overall  balance assessment: Needs assistance   Sitting balance-Leahy Scale: Fair     Standing balance support: Bilateral upper extremity supported Standing balance-Leahy Scale: Poor                               Pertinent Vitals/Pain Pain Assessment: 0-10 Pain Score: 7  Pain Location: R hip Pain Descriptors / Indicators: Sore;Grimacing;Guarding Pain Intervention(s): Limited activity within patient's tolerance;Monitored during session;Premedicated before session;Repositioned;Ice applied;Patient requesting pain meds-RN notified    Home Living Family/patient expects to be discharged to:: Private residence Living Arrangements: Alone Available Help at Discharge: Family;Available 24 hours/day Type of Home: House Home Access: Stairs to enter Entrance Stairs-Rails: None Entrance Stairs-Number of Steps: 3 Home Layout: One level Home Equipment: Walker - 4 wheels;Walker - 2 wheels;Shower seat Additional Comments: pt's home has tub shower with grab bars, pt stands to shower; 3-4 falls in past 1 year due to weak L side, walked with RW PTA, will go to daughter's home upon DC which has 3 STE. Pt's home has many more stairs. Family/friends assisted with transportation.    Prior Function Level of Independence: Independent with assistive device(s)               Hand Dominance        Extremity/Trunk Assessment   Upper Extremity Assessment Upper Extremity Assessment: Overall WFL for tasks assessed (LUE 4/5 grossly)    Lower Extremity Assessment Lower Extremity Assessment: RLE deficits/detail;LLE deficits/detail  RLE Deficits / Details: hip ~2/5 limited by pain, hip flexion AAROM ~35* limited by pain, ankle +4/5 LLE Deficits / Details: pain with attempted heel slide in supine, knee flexion to ~40* limited by pain, pt stated her TKA has "gone bad" and that the Dr stated he'll need to fix it later LLE: Unable to fully assess due to pain    Cervical / Trunk Assessment Cervical /  Trunk Assessment: Normal  Communication   Communication: No difficulties  Cognition Arousal/Alertness: Awake/alert Behavior During Therapy: Anxious Overall Cognitive Status: Within Functional Limits for tasks assessed                                 General Comments: verbal cues for relaxation breathing      General Comments      Exercises Total Joint Exercises Ankle Circles/Pumps: AROM;Both;10 reps;Supine Heel Slides: AAROM;Right;5 reps;Supine Hip ABduction/ADduction: AAROM;Right;5 reps;Supine   Assessment/Plan    PT Assessment Patient needs continued PT services  PT Problem List Decreased strength;Decreased activity tolerance;Decreased balance;Decreased mobility;Pain       PT Treatment Interventions      PT Goals (Current goals can be found in the Care Plan section)  Acute Rehab PT Goals Patient Stated Goal: to be able to walk PT Goal Formulation: With patient Time For Goal Achievement: 06/19/21 Potential to Achieve Goals: Good    Frequency 7X/week   Barriers to discharge        Co-evaluation               AM-PAC PT "6 Clicks" Mobility  Outcome Measure Help needed turning from your back to your side while in a flat bed without using bedrails?: A Lot Help needed moving from lying on your back to sitting on the side of a flat bed without using bedrails?: A Lot Help needed moving to and from a bed to a chair (including a wheelchair)?: Total Help needed standing up from a chair using your arms (e.g., wheelchair or bedside chair)?: A Lot Help needed to walk in hospital room?: Total Help needed climbing 3-5 steps with a railing? : Total 6 Click Score: 9    End of Session Equipment Utilized During Treatment: Gait belt Activity Tolerance: Patient limited by pain Patient left: in chair;with call bell/phone within reach;with chair alarm set;with SCD's reapplied (lift pad under pt in recliner) Nurse Communication: Mobility status;Need for lift  equipment PT Visit Diagnosis: Difficulty in walking, not elsewhere classified (R26.2);Pain Pain - Right/Left: Right Pain - part of body: Hip    Time: 5638-9373 PT Time Calculation (min) (ACUTE ONLY): 33 min   Charges:   PT Evaluation $PT Eval Moderate Complexity: 1 Mod PT Treatments $Therapeutic Activity: 8-22 mins       Blondell Reveal Kistler PT 06/12/2021  Acute Rehabilitation Services Pager [phone] Office [phone]

## 2021-06-12 NOTE — Progress Notes (Signed)
Subjective: 1 Day Post-Op Procedure(s) (LRB): TOTAL HIP ARTHROPLASTY ANTERIOR APPROACH (Right) Patient reports pain as severe.    Objective: Vital signs in last 24 hours: Temp:  [97.5 F (36.4 C)-98.4 F (36.9 C)] 98.4 F (36.9 C) (07/07 0544) Pulse Rate:  [72-97] 97 (07/07 0544) Resp:  [12-20] 14 (07/07 0544) BP: (95-132)/(60-80) 121/73 (07/07 0544) SpO2:  [97 %-100 %] 100 % (07/07 0544) Weight:  [95 kg] 95 kg (07/06 1202)  Intake/Output from previous day: 07/06 0701 - 07/07 0700 In: 3014.7 [P.O.:420; I.V.:2144.7; IV Piggyback:450] Out: 1450 [Urine:1250; Blood:200] Intake/Output this shift: No intake/output data recorded.  Recent Labs    06/11/21 1230 06/12/21 0329  HGB 12.2 11.2*   Recent Labs    06/11/21 1230 06/12/21 0329  WBC 8.3 12.6*  RBC 4.28 3.93  HCT 37.4 35.4*  PLT 264 243   Recent Labs    06/11/21 1230  NA 139  K 3.2*  CL 103  CO2 25  BUN 9  CREATININE 0.72  GLUCOSE 101*  CALCIUM 9.8   Recent Labs    06/11/21 1230  INR 0.9    Neurologically intact ABD soft Neurovascular intact Sensation intact distally Intact pulses distally No cellulitis present Compartment soft   Assessment/Plan: 1 Day Post-Op Procedure(s) (LRB): TOTAL HIP ARTHROPLASTY ANTERIOR APPROACH (Right) Advance diet Up with therapy Discharge home with home health either later today or tomorrow. Pain control an issue.  We are using dilaudid and oxy 15mg .  Will reaseesss her situation later today to se how she does with therapy.   May need to swith to inpatient status if she does poorly in PT today.   Ellen Henry 06/12/2021, 8:19 AM

## 2021-06-12 NOTE — TOC Transition Note (Signed)
Transition of Care [facility]) - CM/SW Discharge Note   Patient Details  Name: Ellen Henry MRN: [id number] Date of Birth: Sep 17, 1946  [facility] CM/SW Contact:  Lennart Pall, LCSW Phone Number: 06/12/2021, 10:39 AM   Clinical Narrative:    Met with pt and confirming receipt of rw and 3n1 via [place].  Plan to resume HHPT with [facility].  No further TOC needs.   Final next level of care: [place] Barriers to Discharge: No Barriers Identified   Patient Goals and CMS Choice Patient states their goals for this hospitalization and ongoing recovery are:: return home      Discharge Placement                       Discharge Plan and Services                DME Arranged: 3-N-1, Walker rolling DME Agency: [employer] Date DME Agency Contacted:  (ordered PTA)     HH Arranged: PT HH Agency: [facility] Date [place] Agency Contacted:  (open with pt prior to surgery)      Social Determinants of Health (SDOH) Interventions     Readmission Risk Interventions No flowsheet data found.

## 2021-06-12 NOTE — Plan of Care (Signed)

## 2021-06-13 ENCOUNTER — Inpatient Hospital Stay (HOSPITAL_COMMUNITY): Payer: Medicare Other

## 2021-06-13 DIAGNOSIS — Z79899 Other long term (current) drug therapy: Secondary | ICD-10-CM | POA: Diagnosis not present

## 2021-06-13 DIAGNOSIS — I10 Essential (primary) hypertension: Secondary | ICD-10-CM | POA: Diagnosis not present

## 2021-06-13 DIAGNOSIS — I69391 Dysphagia following cerebral infarction: Secondary | ICD-10-CM | POA: Diagnosis not present

## 2021-06-13 DIAGNOSIS — Z87891 Personal history of nicotine dependence: Secondary | ICD-10-CM | POA: Diagnosis not present

## 2021-06-13 DIAGNOSIS — Z981 Arthrodesis status: Secondary | ICD-10-CM | POA: Diagnosis not present

## 2021-06-13 DIAGNOSIS — M1611 Unilateral primary osteoarthritis, right hip: Secondary | ICD-10-CM | POA: Diagnosis not present

## 2021-06-13 DIAGNOSIS — Z96641 Presence of right artificial hip joint: Secondary | ICD-10-CM

## 2021-06-13 DIAGNOSIS — Z7982 Long term (current) use of aspirin: Secondary | ICD-10-CM | POA: Diagnosis not present

## 2021-06-13 DIAGNOSIS — Z6841 Body Mass Index (BMI) 40.0 and over, adult: Secondary | ICD-10-CM | POA: Diagnosis not present

## 2021-06-13 DIAGNOSIS — Z96653 Presence of artificial knee joint, bilateral: Secondary | ICD-10-CM | POA: Diagnosis not present

## 2021-06-13 DIAGNOSIS — E785 Hyperlipidemia, unspecified: Secondary | ICD-10-CM | POA: Diagnosis not present

## 2021-06-13 DIAGNOSIS — Z20822 Contact with and (suspected) exposure to covid-19: Secondary | ICD-10-CM | POA: Diagnosis not present

## 2021-06-13 DIAGNOSIS — I69354 Hemiplegia and hemiparesis following cerebral infarction affecting left non-dominant side: Secondary | ICD-10-CM | POA: Diagnosis not present

## 2021-06-13 DIAGNOSIS — R1310 Dysphagia, unspecified: Secondary | ICD-10-CM | POA: Diagnosis not present

## 2021-06-13 DIAGNOSIS — Z96611 Presence of right artificial shoulder joint: Secondary | ICD-10-CM | POA: Diagnosis present

## 2021-06-13 DIAGNOSIS — M109 Gout, unspecified: Secondary | ICD-10-CM | POA: Diagnosis present

## 2021-06-13 DIAGNOSIS — G4730 Sleep apnea, unspecified: Secondary | ICD-10-CM | POA: Diagnosis present

## 2021-06-13 DIAGNOSIS — G894 Chronic pain syndrome: Secondary | ICD-10-CM | POA: Diagnosis not present

## 2021-06-13 LAB — CBC
HCT: 35.4 % — ABNORMAL LOW (ref 36.0–46.0)
Hemoglobin: 11.5 g/dL — ABNORMAL LOW (ref 12.0–15.0)
MCH: 28.6 pg (ref 26.0–34.0)
MCHC: 32.5 g/dL (ref 30.0–36.0)
MCV: 88.1 fL (ref 80.0–100.0)
Platelets: 221 10*3/uL (ref 150–400)
RBC: 4.02 MIL/uL (ref 3.87–5.11)
RDW: 14.7 % (ref 11.5–15.5)
WBC: 14.3 10*3/uL — ABNORMAL HIGH (ref 4.0–10.5)
nRBC: 0 % (ref 0.0–0.2)

## 2021-06-13 NOTE — Progress Notes (Signed)
Physical Therapy Treatment Patient Details Name: Ellen Henry MRN: [id number] DOB: Apr 02, 1946 Today's Date: 06/13/2021    History of Present Illness 75 y.o. female admitted for R AA-THA on 06/11/21. PMH includes: neuropathy, L hemiparesis, CVA, chronic pain.    PT Comments    Progressing toward PT goals. Pt with more pain LLE than RLE stating she has chronic L knee pain as well as back pain limiting her mobility at baseline. Will continue to follow, hopefully will be able to d/c over wknd if pain controlled and mobility improved.     Follow Up Recommendations  Follow surgeon's recommendation for DC plan and follow-up therapies;Supervision/Assistance - 24 hour     Equipment Recommendations  3in1 (PT)    Recommendations for Other Services       Precautions / Restrictions Precautions Precautions: Fall Precaution Comments: reports 3-4 falls in past year from "getting overbalanced because of my weak L side". Restrictions RLE Weight Bearing: Weight bearing as tolerated    Mobility  Bed Mobility Overal bed mobility: Needs Assistance             General bed mobility comments: pt just back in bed    Transfers                    Ambulation/Gait                 Stairs             Wheelchair Mobility    Modified Rankin (Stroke Patients Only)       Balance                                            Cognition Arousal/Alertness: Awake/alert Behavior During Therapy: WFL for tasks assessed/performed Overall Cognitive Status: Within Functional Limits for tasks assessed                                 General Comments: verbal cues for relaxation breathing, pt reports significant LLE pain at baseline, R hip as well as chronic back pain      Exercises Total Joint Exercises Ankle Circles/Pumps: AROM;Both;10 reps;Supine Quad Sets: AROM;Both;10 reps Heel Slides: AAROM;Right;5 reps;Both (limited by pain L knee) Hip  ABduction/ADduction: AROM;AAROM;Both;10 reps    General Comments        Pertinent Vitals/Pain Pain Assessment: 0-10 Pain Score: 8  Pain Location: R hip Pain Descriptors / Indicators: Sore;Grimacing;Guarding Pain Intervention(s): Limited activity within patient's tolerance;Monitored during session;Premedicated before session;Repositioned    Home Living                      Prior Function            PT Goals (current goals can now be found in the care plan section) Acute Rehab PT Goals Patient Stated Goal: to be able to walk PT Goal Formulation: With patient Time For Goal Achievement: 06/19/21 Potential to Achieve Goals: Good Progress towards PT goals: Progressing toward goals    Frequency    7X/week      PT Plan Current plan remains appropriate    Co-evaluation              AM-PAC PT "6 Clicks" Mobility   Outcome Measure  Help needed turning from your back to your side while  in a flat bed without using bedrails?: A Lot Help needed moving from lying on your back to sitting on the side of a flat bed without using bedrails?: A Lot Help needed moving to and from a bed to a chair (including a wheelchair)?: A Little Help needed standing up from a chair using your arms (e.g., wheelchair or bedside chair)?: A Little Help needed to walk in hospital room?: A Little Help needed climbing 3-5 steps with a railing? : A Little 6 Click Score: 16    End of Session   Activity Tolerance: Patient tolerated treatment well;Patient limited by pain (pain LLE > RLE) Patient left: in bed;with call bell/phone within reach;with bed alarm set Nurse Communication: Mobility status PT Visit Diagnosis: Difficulty in walking, not elsewhere classified (R26.2);Pain Pain - Right/Left: Right Pain - part of body: Hip     Time: 7014-1030 PT Time Calculation (min) (ACUTE ONLY): 17 min  Charges:  $Therapeutic Exercise: 8-22 mins                     Baxter Flattery, PT  Acute Rehab Dept  ([place]) [phone] Pager [phone]  06/13/2021    [facility] 06/13/2021, 3:22 PM

## 2021-06-13 NOTE — Plan of Care (Signed)

## 2021-06-13 NOTE — Progress Notes (Signed)
Subjective: 2 Days Post-Op Procedure(s) (LRB): TOTAL HIP ARTHROPLASTY ANTERIOR APPROACH (Right) Patient reports pain as moderate.  She reports pain when she moves her hip.  She also is having pain in her opposite leg.  She has made slow progress with physical therapy.  She is taking by mouth and voiding okay.  She wants to go home, and not to a skilled nursing facility.  She states that her daughter lives at home with her.  Objective: Vital signs in last 24 hours: Temp:  [98.2 F (36.8 C)-98.5 F (36.9 C)] 98.5 F (36.9 C) (07/08 0616) Pulse Rate:  [86-91] 91 (07/08 0616) Resp:  [16-18] 16 (07/08 0616) BP: (116-123)/(71-80) 123/80 (07/08 0616) SpO2:  [98 %-99 %] 98 % (07/08 0616)  Intake/Output from previous day: 07/07 0701 - 07/08 0700 In: 1113.8 [P.O.:360; I.V.:753.8] Out: 2300 [Urine:2300] Intake/Output this shift: Total I/O In: 120 [P.O.:120] Out: 200 [Urine:200]  Recent Labs    06/11/21 1230 06/12/21 0329 06/13/21 0314  HGB 12.2 11.2* 11.5*   Recent Labs    06/12/21 0329 06/13/21 0314  WBC 12.6* 14.3*  RBC 3.93 4.02  HCT 35.4* 35.4*  PLT 243 221   Recent Labs    06/11/21 1230  NA 139  K 3.2*  CL 103  CO2 25  BUN 9  CREATININE 0.72  GLUCOSE 101*  CALCIUM 9.8   Recent Labs    06/11/21 1230  INR 0.9   Right hip exam: She has pain with motion of her right hip.  Her leg is not shortened and her foot is not rotated.  Her Aquacel dressing is clean and dry.  Her right calf is soft and nontender.  Neurovascular status is intact to the right lower extremity. Patient is alert and oriented.   Assessment/Plan: 2 Days Post-Op Procedure(s) (LRB): TOTAL HIP ARTHROPLASTY ANTERIOR APPROACH (Right) Obesity. Plan: Up with therapy Plan for discharge tomorrow Weight-bear as tolerated on the right.  No hip precautions.  I will order a portable AP of the pelvis to confirm that she does not have a total hip dislocation. Continue aspirin 325 mg twice daily x1 month  postop for DVT prophylaxis. She should be ready for discharge home tomorrow.  She will need home health PT   Anticipated LOS equal to or greater than 2 midnights due to - Age 76 and older with one or more of the following:  - Obesity  - Expected need for hospital services (PT, OT, Nursing) required for safe  discharge  - Anticipated need for postoperative skilled nursing care or inpatient rehab  - Active co-morbidities: Chronic pain requiring opiods and obesity/deconditioning OR   - Unanticipated findings during/Post Surgery: Slow post-op progression: GI, pain control, mobility  -    Erlene Senters 06/13/2021, 1:26 PM

## 2021-06-13 NOTE — Discharge Instructions (Signed)

## 2021-06-13 NOTE — Progress Notes (Signed)
Physical Therapy Treatment Patient Details Name: Ellen Henry MRN: [id number] DOB: 08-10-1946 Today's Date: 06/13/2021    History of Present Illness 75 y.o. female admitted for R AA-THA on 06/11/21. PMH includes: neuropathy, L hemiparesis, CVA, chronic pain.    PT Comments    Pt progressing slowly toward PT goals. Continues to have issues with pain however mobility improved overall. Able to amb ~ 9'. Pt has 3 steps to get  into dtr's home and given this combined with pain issues do not feel she will be ready to d/c today   Follow Up Recommendations  Follow surgeon's recommendation for DC plan and follow-up therapies;Supervision/Assistance - 24 hour     Equipment Recommendations  3in1 (PT)    Recommendations for Other Services       Precautions / Restrictions Precautions Precautions: Fall Precaution Comments: reports 3-4 falls in past year from "getting overbalanced because of my weak L side". Restrictions Weight Bearing Restrictions: No RLE Weight Bearing: Weight bearing as tolerated    Mobility  Bed Mobility               General bed mobility comments: in recliner on arrival    Transfers Overall transfer level: Needs assistance Equipment used: Rolling walker (2 wheeled) Transfers: Sit to/from Stand Sit to Stand: +2 safety/equipment;+2 physical assistance         General transfer comment: +2 min assist to power up from recliner, cues for hand placement, RLE position  Ambulation/Gait Ambulation/Gait assistance: Min assist;+2 safety/equipment Gait Distance (Feet): 9 Feet Assistive device: Rolling walker (2 wheeled) Gait Pattern/deviations: Step-to pattern;Antalgic;Decreased stance time - right     General Gait Details: cues for sequence, tolerating incr wt on RLE but continues to have issues related to pain limiting distance   Stairs             Wheelchair Mobility    Modified Rankin (Stroke Patients Only)       Balance            Standing balance support: Bilateral upper extremity supported Standing balance-Leahy Scale: Poor                              Cognition Arousal/Alertness: Awake/alert Behavior During Therapy: Anxious Overall Cognitive Status: Within Functional Limits for tasks assessed                                        Exercises Total Joint Exercises Ankle Circles/Pumps: AROM;Both;10 reps;Supine Heel Slides: AAROM;Right;5 reps;Both    General Comments        Pertinent Vitals/Pain Pain Assessment: 0-10 Pain Score: 9  Pain Location: R hip Pain Descriptors / Indicators: Sore;Grimacing;Guarding Pain Intervention(s): Limited activity within patient's tolerance;Monitored during session;Premedicated before session;Repositioned;Ice applied    Home Living                      Prior Function            PT Goals (current goals can now be found in the care plan section) Acute Rehab PT Goals Patient Stated Goal: to be able to walk PT Goal Formulation: With patient Time For Goal Achievement: 06/19/21 Potential to Achieve Goals: Good Progress towards PT goals: Progressing toward goals    Frequency    7X/week      PT Plan Current plan remains appropriate  Co-evaluation              AM-PAC PT "6 Clicks" Mobility   Outcome Measure  Help needed turning from your back to your side while in a flat bed without using bedrails?: A Lot Help needed moving from lying on your back to sitting on the side of a flat bed without using bedrails?: A Lot Help needed moving to and from a bed to a chair (including a wheelchair)?: A Little Help needed standing up from a chair using your arms (e.g., wheelchair or bedside chair)?: A Little Help needed to walk in hospital room?: A Lot Help needed climbing 3-5 steps with a railing? : A Lot 6 Click Score: 14    End of Session Equipment Utilized During Treatment: Gait belt Activity Tolerance: Patient limited  by pain Patient left: in chair;with call bell/phone within reach;with chair alarm set Nurse Communication: Mobility status PT Visit Diagnosis: Difficulty in walking, not elsewhere classified (R26.2);Pain Pain - Right/Left: Right Pain - part of body: Hip     Time: 1112-1127 PT Time Calculation (min) (ACUTE ONLY): 15 min  Charges:  $Gait Training: 8-22 mins                     Baxter Flattery, PT  Acute Rehab Dept ([place]) [phone] Pager [phone]  06/13/2021    [facility] 06/13/2021, 11:33 AM

## 2021-06-14 LAB — CBC
HCT: 31.4 % — ABNORMAL LOW (ref 36.0–46.0)
Hemoglobin: 10.5 g/dL — ABNORMAL LOW (ref 12.0–15.0)
MCH: 29 pg (ref 26.0–34.0)
MCHC: 33.4 g/dL (ref 30.0–36.0)
MCV: 86.7 fL (ref 80.0–100.0)
Platelets: 265 10*3/uL (ref 150–400)
RBC: 3.62 MIL/uL — ABNORMAL LOW (ref 3.87–5.11)
RDW: 14.8 % (ref 11.5–15.5)
WBC: 13.8 10*3/uL — ABNORMAL HIGH (ref 4.0–10.5)
nRBC: 0 % (ref 0.0–0.2)

## 2021-06-14 NOTE — Plan of Care (Signed)
Plan of care reviewed and discussed with the patient. 

## 2021-06-14 NOTE — Progress Notes (Signed)
Physical Therapy Treatment Patient Details Name: Ellen Henry MRN: [id number] DOB: May 08, 1946 Today's Date: 06/14/2021    History of Present Illness 75 y.o. female admitted for R AA-THA on 06/11/21. PMH includes: neuropathy, L hemiparesis, CVA, chronic pain.    PT Comments    Pt progressing well. Will review stairs next session/prior to d/c.    Follow Up Recommendations  Follow surgeon's recommendation for DC plan and follow-up therapies;Supervision/Assistance - 24 hour     Equipment Recommendations  3in1 (PT)    Recommendations for Other Services       Precautions / Restrictions Precautions Precautions: Fall Precaution Comments: reports 3-4 falls in past year from "getting overbalanced because of my weak L side". Restrictions RLE Weight Bearing: Weight bearing as tolerated    Mobility  Bed Mobility Overal bed mobility: Needs Assistance Bed Mobility: Supine to Sit;Sit to Supine     Supine to sit: HOB elevated;Min assist Sit to supine: Min assist;Mod assist   General bed mobility comments: use of bedrail, incr time, assist with RLE out of bed, bil LE assist to return to supine    Transfers Overall transfer level: Needs assistance Equipment used: Rolling walker (2 wheeled) Transfers: Sit to/from Stand Sit to Stand: Min guard         General transfer comment: min-guard to come to stand and transition to RW. cues for hand placement, RLE position  Ambulation/Gait Ambulation/Gait assistance: Min guard Gait Distance (Feet): 60 Feet Assistive device: Rolling walker (2 wheeled) Gait Pattern/deviations: Step-to pattern;Antalgic;Decreased stance time - right;Step-through pattern     General Gait Details: cues for sequence, tolerating incr wt on RLE, improved wt shift and incr step length   Stairs             Wheelchair Mobility    Modified Rankin (Stroke Patients Only)       Balance           Standing balance support: Bilateral upper extremity  supported Standing balance-Leahy Scale: Poor                              Cognition Arousal/Alertness: Awake/alert Behavior During Therapy: WFL for tasks assessed/performed Overall Cognitive Status: Within Functional Limits for tasks assessed                                        Exercises Total Joint Exercises Ankle Circles/Pumps: AROM;Both;10 reps;Supine Quad Sets: AROM;Both;10 reps Heel Slides: AAROM;Right;5 reps;Both Hip ABduction/ADduction: AROM;AAROM;Both;10 reps    General Comments        Pertinent Vitals/Pain Pain Assessment: Faces Faces Pain Scale: Hurts little more Pain Location: R hip Pain Descriptors / Indicators: Sore;Grimacing;Guarding;Burning Pain Intervention(s): Limited activity within patient's tolerance;Monitored during session;Premedicated before session    Home Living                      Prior Function            PT Goals (current goals can now be found in the care plan section) Acute Rehab PT Goals Patient Stated Goal: to be able to walk PT Goal Formulation: With patient Time For Goal Achievement: 06/19/21 Potential to Achieve Goals: Good Progress towards PT goals: Progressing toward goals    Frequency    7X/week      PT Plan Current plan remains appropriate    Co-evaluation  AM-PAC PT "6 Clicks" Mobility   Outcome Measure  Help needed turning from your back to your side while in a flat bed without using bedrails?: A Lot Help needed moving from lying on your back to sitting on the side of a flat bed without using bedrails?: A Little Help needed moving to and from a bed to a chair (including a wheelchair)?: A Little Help needed standing up from a chair using your arms (e.g., wheelchair or bedside chair)?: A Little Help needed to walk in hospital room?: A Little Help needed climbing 3-5 steps with a railing? : A Lot 6 Click Score: 16    End of Session Equipment Utilized  During Treatment: Gait belt Activity Tolerance: Patient limited by pain Patient left: in chair;with call bell/phone within reach;with chair alarm set Nurse Communication: Mobility status PT Visit Diagnosis: Difficulty in walking, not elsewhere classified (R26.2);Pain Pain - Right/Left: Right Pain - part of body: Hip     Time: 2671-2458 PT Time Calculation (min) (ACUTE ONLY): 27 min  Charges:  $Therapeutic Exercise: 8-22 mins                     Baxter Flattery, PT  Acute Rehab Dept ([place]) [phone] Pager [phone]  06/14/2021    [facility] 06/14/2021, 3:54 PM

## 2021-06-14 NOTE — Plan of Care (Signed)
  Problem: Education: Goal: Knowledge of General Education information will improve Description: Including pain rating scale, medication(s)/side effects and non-pharmacologic comfort measures Outcome: Progressing   Problem: Activity: Goal: Risk for activity intolerance will decrease Outcome: Progressing   Problem: Pain Managment: Goal: General experience of comfort will improve Outcome: Progressing   

## 2021-06-14 NOTE — Progress Notes (Signed)
Physical Therapy Treatment Patient Details Name: Ellen Henry MRN: [id number] DOB: 05-24-1946 Today's Date: 06/14/2021    History of Present Illness 75 y.o. female admitted for R AA-THA on 06/11/21. PMH includes: neuropathy, L hemiparesis, CVA, chronic pain.    PT Comments    Pt progressing with PT today, incr amb distance/tolerance this am. Will need to be able to amb up 3 steps prior to d/c  Follow Up Recommendations  Follow surgeon's recommendation for DC plan and follow-up therapies;Supervision/Assistance - 24 hour     Equipment Recommendations  3in1 (PT)    Recommendations for Other Services       Precautions / Restrictions Precautions Precautions: Fall Precaution Comments: reports 3-4 falls in past year from "getting overbalanced because of my weak L side". Restrictions RLE Weight Bearing: Weight bearing as tolerated    Mobility  Bed Mobility Overal bed mobility: Needs Assistance Bed Mobility: Supine to Sit     Supine to sit: Min guard;HOB elevated     General bed mobility comments: use of bedrail, incr time, able to self assist RLE with UEs    Transfers Overall transfer level: Needs assistance Equipment used: Rolling walker (2 wheeled) Transfers: Sit to/from Stand Sit to Stand: Min assist;Min guard         General transfer comment: light  min assist come to stand and transition to RW. cues for hand placement, RLE position  Ambulation/Gait Ambulation/Gait assistance: Min guard Gait Distance (Feet): 100 Feet Assistive device: Rolling walker (2 wheeled) Gait Pattern/deviations: Step-to pattern;Antalgic;Decreased stance time - right;Step-through pattern     General Gait Details: cues for sequence, tolerating incr wt on RLE, improved wt shift and incr step length   Stairs             Wheelchair Mobility    Modified Rankin (Stroke Patients Only)       Balance           Standing balance support: Bilateral upper extremity  supported Standing balance-Leahy Scale: Poor                              Cognition Arousal/Alertness: Awake/alert Behavior During Therapy: WFL for tasks assessed/performed Overall Cognitive Status: Within Functional Limits for tasks assessed                                        Exercises Total Joint Exercises Ankle Circles/Pumps: AROM;Both;10 reps;Supine    General Comments        Pertinent Vitals/Pain Pain Assessment: Faces Faces Pain Scale: Hurts little more Pain Location: R hip Pain Descriptors / Indicators: Sore;Grimacing;Guarding;Burning Pain Intervention(s): Limited activity within patient's tolerance;Monitored during session;Premedicated before session;Repositioned    Home Living                      Prior Function            PT Goals (current goals can now be found in the care plan section) Acute Rehab PT Goals Patient Stated Goal: to be able to walk PT Goal Formulation: With patient Time For Goal Achievement: 06/19/21 Potential to Achieve Goals: Good Progress towards PT goals: Progressing toward goals    Frequency    7X/week      PT Plan Current plan remains appropriate    Co-evaluation  AM-PAC PT "6 Clicks" Mobility   Outcome Measure  Help needed turning from your back to your side while in a flat bed without using bedrails?: A Lot Help needed moving from lying on your back to sitting on the side of a flat bed without using bedrails?: A Little Help needed moving to and from a bed to a chair (including a wheelchair)?: A Little Help needed standing up from a chair using your arms (e.g., wheelchair or bedside chair)?: A Little Help needed to walk in hospital room?: A Little Help needed climbing 3-5 steps with a railing? : A Lot 6 Click Score: 16    End of Session Equipment Utilized During Treatment: Gait belt Activity Tolerance: Patient limited by pain Patient left: in chair;with call  bell/phone within reach;with chair alarm set Nurse Communication: Mobility status PT Visit Diagnosis: Difficulty in walking, not elsewhere classified (R26.2);Pain Pain - Right/Left: Right Pain - part of body: Hip     Time: 6294-7654 PT Time Calculation (min) (ACUTE ONLY): 35 min  Charges:  $Gait Training: 23-37 mins                     Baxter Flattery, PT  Acute Rehab Dept ([place]) [phone] Pager [phone]  06/14/2021    [facility] 06/14/2021, 11:43 AM

## 2021-06-15 MED ORDER — OXYCODONE HCL 5 MG PO TABS: 5.0000 mg | ORAL_TABLET | Freq: Four times a day (QID) | ORAL | 0 refills | Status: DC | PRN

## 2021-06-15 MED ORDER — METHOCARBAMOL 500 MG PO TABS: 500.0000 mg | ORAL_TABLET | Freq: Three times a day (TID) | ORAL | 0 refills | Status: DC | PRN

## 2021-06-15 MED ORDER — ASPIRIN 325 MG PO TBEC: 325.0000 mg | DELAYED_RELEASE_TABLET | Freq: Two times a day (BID) | ORAL | 0 refills | Status: DC

## 2021-06-15 NOTE — Plan of Care (Signed)
  Problem: Activity: Goal: Risk for activity intolerance will decrease Outcome: Progressing   Problem: Coping: Goal: Level of anxiety will decrease Outcome: Progressing   Problem: Pain Managment: Goal: General experience of comfort will improve Outcome: Progressing   Problem: Safety: Goal: Ability to remain free from injury will improve Outcome: Progressing   Problem: Pain Management: Goal: Pain level will decrease with appropriate interventions Outcome: Progressing

## 2021-06-15 NOTE — Plan of Care (Signed)
  Problem: Education: Goal: Knowledge of General Education information will improve Description: Including pain rating scale, medication(s)/side effects and non-pharmacologic comfort measures Outcome: Progressing   Problem: Pain Managment: Goal: General experience of comfort will improve Outcome: Progressing   Problem: Safety: Goal: Ability to remain free from injury will improve Outcome: Progressing   

## 2021-06-15 NOTE — Progress Notes (Signed)
Physical Therapy Treatment Patient Details Name: Ellen Henry MRN: [id number] DOB: February 09, 1946 Today's Date: 06/15/2021    History of Present Illness 75 y.o. female admitted for R AA-THA on 06/11/21. PMH includes: neuropathy, L hemiparesis, CVA, chronic pain.    PT Comments    Pt progressing well. Did well with stairs, see below for details. Ready for d/c   Follow Up Recommendations  Follow surgeon's recommendation for DC plan and follow-up therapies;Supervision/Assistance - 24 hour     Equipment Recommendations  3in1 (PT)    Recommendations for Other Services       Precautions / Restrictions Precautions Precautions: Fall Precaution Comments: reports 3-4 falls in past year from "getting overbalanced because of my weak L side". Restrictions Weight Bearing Restrictions: No RLE Weight Bearing: Weight bearing as tolerated    Mobility  Bed Mobility         Supine to sit: HOB elevated;Min assist     General bed mobility comments: use of bedrail, incr time, assist with RLE out of bed    Transfers Overall transfer level: Needs assistance Equipment used: Rolling walker (2 wheeled) Transfers: Sit to/from Stand Sit to Stand: Supervision         General transfer comment: cues for hand placement  Ambulation/Gait Ambulation/Gait assistance: Supervision Gait Distance (Feet): 50 Feet Assistive device: Rolling walker (2 wheeled) Gait Pattern/deviations: Step-to pattern;Step-through pattern     General Gait Details: cues for sequence, tolerating incr wt on RLE, improved wt shift and incr step length   Stairs Stairs: Yes Stairs assistance: Min guard;Min assist Stair Management: No rails;Step to pattern;Backwards;With walker Number of Stairs: 3 General stair comments: cues for sequence and technique. pt verbalizes being familiar with this technique   Wheelchair Mobility    Modified Rankin (Stroke Patients Only)       Balance           Standing balance  support: Single extremity supported;No upper extremity supported Standing balance-Leahy Scale: Fair (able to static stand without UE support)                              Cognition Arousal/Alertness: Awake/alert Behavior During Therapy: WFL for tasks assessed/performed Overall Cognitive Status: Within Functional Limits for tasks assessed                                        Exercises      General Comments        Pertinent Vitals/Pain Pain Assessment: Faces Faces Pain Scale: Hurts a little bit Pain Location: R hip & L knee Pain Descriptors / Indicators: Sore Pain Intervention(s): Limited activity within patient's tolerance;Monitored during session;Premedicated before session;Repositioned    Home Living                      Prior Function            PT Goals (current goals can now be found in the care plan section) Acute Rehab PT Goals Patient Stated Goal: to be able to walk PT Goal Formulation: With patient Time For Goal Achievement: 06/19/21 Potential to Achieve Goals: Good Progress towards PT goals: Progressing toward goals    Frequency    7X/week      PT Plan Current plan remains appropriate    Co-evaluation  AM-PAC PT "6 Clicks" Mobility   Outcome Measure  Help needed turning from your back to your side while in a flat bed without using bedrails?: A Lot Help needed moving from lying on your back to sitting on the side of a flat bed without using bedrails?: A Little Help needed moving to and from a bed to a chair (including a wheelchair)?: A Little Help needed standing up from a chair using your arms (e.g., wheelchair or bedside chair)?: A Little Help needed to walk in hospital room?: A Little Help needed climbing 3-5 steps with a railing? : A Little 6 Click Score: 17    End of Session Equipment Utilized During Treatment: Gait belt Activity Tolerance: Patient tolerated treatment well Patient  left: in chair;with call bell/phone within reach;with chair alarm set   PT Visit Diagnosis: Difficulty in walking, not elsewhere classified (R26.2);Pain Pain - Right/Left: Right Pain - part of body: Hip     Time: 1049-1106 PT Time Calculation (min) (ACUTE ONLY): 17 min  Charges:  $Gait Training: 8-22 mins                     Baxter Flattery, PT  Acute Rehab Dept ([facility]) [phone] Pager [phone]  06/15/2021    [facility] 06/15/2021, 11:13 AM

## 2021-06-15 NOTE — Plan of Care (Signed)
Patient dc's care plans complete

## 2021-06-15 NOTE — Progress Notes (Signed)
PATIENT ID: Ellen Henry  MRN: [id number]  DOB/AGE:  March 05, 1946 / 75 y.o.  4 Days Post-Op Procedure(s) (LRB): TOTAL HIP ARTHROPLASTY ANTERIOR APPROACH (Right)  Subjective: Pain is mild. Reports pain is much better managed than yesterday. Voiding well. BM yesterday. No c/o chest pain or SOB.  Patient voices concern for getting into her house with 3 stairs.     Objective: Vital signs in last 24 hours: Temp:  [98.1 F (36.7 C)-98.2 F (36.8 C)] 98.1 F (36.7 C) (07/10 0512) Pulse Rate:  [67-81] 81 (07/10 0512) Resp:  [15-18] 16 (07/10 0512) BP: (103-110)/(61-69) 104/61 (07/10 0512) SpO2:  [95 %-99 %] 95 % (07/10 0512)  Intake/Output from previous day: 07/09 0701 - 07/10 0700 In: 840 [P.O.:840] Out: -  Intake/Output this shift: No intake/output data recorded.  Recent Labs    06/13/21 0314 06/14/21 0152  HGB 11.5* 10.5*   Recent Labs    06/13/21 0314 06/14/21 0152  WBC 14.3* 13.8*  RBC 4.02 3.62*  HCT 35.4* 31.4*  PLT 221 265     Physical Exam: Neurologically intact Sensation intact distally Intact pulses distally Dorsiflexion/Plantar flexion intact Incision: dressing C/D/I No cellulitis present Compartment soft  Assessment/Plan: 4 Days Post-Op Procedure(s) (LRB): TOTAL HIP ARTHROPLASTY ANTERIOR APPROACH (Right)   Advance diet Up with therapy Weight Bearing as Tolerated (WBAT)  VTE prophylaxis:  ASA 325mg  BID  Plan for DC home today pending ability to do 3 stairs with PT. Follow up in office with Dr Berenice Primas on 7/19 at 9:45am. Discharge instructions given.    Ellen Seaborn L. [place], PA-C 06/15/2021, 8:01 AM

## 2021-06-17 DIAGNOSIS — Z96641 Presence of right artificial hip joint: Secondary | ICD-10-CM | POA: Diagnosis not present

## 2021-06-17 DIAGNOSIS — Z471 Aftercare following joint replacement surgery: Secondary | ICD-10-CM | POA: Diagnosis not present

## 2021-06-17 DIAGNOSIS — Z96653 Presence of artificial knee joint, bilateral: Secondary | ICD-10-CM | POA: Diagnosis not present

## 2021-06-17 DIAGNOSIS — I10 Essential (primary) hypertension: Secondary | ICD-10-CM | POA: Diagnosis not present

## 2021-06-17 DIAGNOSIS — Z9181 History of falling: Secondary | ICD-10-CM | POA: Diagnosis not present

## 2021-06-17 DIAGNOSIS — M069 Rheumatoid arthritis, unspecified: Secondary | ICD-10-CM | POA: Diagnosis not present

## 2021-06-17 DIAGNOSIS — J45909 Unspecified asthma, uncomplicated: Secondary | ICD-10-CM | POA: Diagnosis not present

## 2021-06-18 ENCOUNTER — Encounter (HOSPITAL_COMMUNITY): Payer: Medicare Other

## 2021-06-18 ENCOUNTER — Other Ambulatory Visit (HOSPITAL_COMMUNITY): Payer: Medicare Other

## 2021-06-19 DIAGNOSIS — Z9181 History of falling: Secondary | ICD-10-CM | POA: Diagnosis not present

## 2021-06-19 DIAGNOSIS — Z96653 Presence of artificial knee joint, bilateral: Secondary | ICD-10-CM | POA: Diagnosis not present

## 2021-06-19 DIAGNOSIS — J45909 Unspecified asthma, uncomplicated: Secondary | ICD-10-CM | POA: Diagnosis not present

## 2021-06-19 DIAGNOSIS — Z471 Aftercare following joint replacement surgery: Secondary | ICD-10-CM | POA: Diagnosis not present

## 2021-06-19 DIAGNOSIS — I10 Essential (primary) hypertension: Secondary | ICD-10-CM | POA: Diagnosis not present

## 2021-06-19 DIAGNOSIS — M069 Rheumatoid arthritis, unspecified: Secondary | ICD-10-CM | POA: Diagnosis not present

## 2021-06-19 DIAGNOSIS — Z96641 Presence of right artificial hip joint: Secondary | ICD-10-CM | POA: Diagnosis not present

## 2021-06-20 DIAGNOSIS — Z471 Aftercare following joint replacement surgery: Secondary | ICD-10-CM | POA: Diagnosis not present

## 2021-06-20 DIAGNOSIS — Z9181 History of falling: Secondary | ICD-10-CM | POA: Diagnosis not present

## 2021-06-20 DIAGNOSIS — Z96653 Presence of artificial knee joint, bilateral: Secondary | ICD-10-CM | POA: Diagnosis not present

## 2021-06-20 DIAGNOSIS — Z96641 Presence of right artificial hip joint: Secondary | ICD-10-CM | POA: Diagnosis not present

## 2021-06-20 DIAGNOSIS — M069 Rheumatoid arthritis, unspecified: Secondary | ICD-10-CM | POA: Diagnosis not present

## 2021-06-20 DIAGNOSIS — J45909 Unspecified asthma, uncomplicated: Secondary | ICD-10-CM | POA: Diagnosis not present

## 2021-06-20 DIAGNOSIS — I10 Essential (primary) hypertension: Secondary | ICD-10-CM | POA: Diagnosis not present

## 2021-06-22 NOTE — Discharge Summary (Signed)
Patient ID: Ellen Henry MRN: [id number] DOB/AGE: [birth year] 76 y.o.  Admit date: 06/11/2021 Discharge date: 06/15/2021  Admission Diagnoses:  Principal Problem:   Primary osteoarthritis of right hip Active Problems:   Morbid obesity ([place])   Status post total replacement of right hip   Status post total hip replacement, right   Discharge Diagnoses:  Same  Past Medical History:  Diagnosis Date   Acute kidney failure due to procedure    Arthritis    Complication of anesthesia    after nerve block difficult breathing (at [place] [facility] [address] transported to [facility])   Dry skin    GERD (gastroesophageal reflux disease)    "years ago", diet controlled   Gout    H/O pyelonephritis    as a child   Heart murmur    since birth, denies any problems    History of sepsis    after spinal surgery   Hypertension    Impingement syndrome of right shoulder    Neuropathy    Osteoarthritis of AC (acromioclavicular) joint    Right   Pneumonia    Radiculopathy    Rotator cuff tear, right    Sleep apnea    does not use cpap   Stroke ([place]) 12/15/2016   left side weakness    Surgeries: Procedure(s): Right TOTAL HIP ARTHROPLASTY ANTERIOR APPROACH on 06/11/2021   Consultants:   None  Discharged Condition: Improved  Hospital Course: Ellen Henry is an 75 y.o. female who was admitted 06/11/2021 for operative treatment ofPrimary osteoarthritis of right hip. Patient has severe unremitting pain that affects sleep, daily activities, and work/hobbies. After pre-op clearance the patient was taken to the operating room on 06/11/2021 and underwent  Procedure(s): Right TOTAL HIP ARTHROPLASTY ANTERIOR APPROACH.    Patient was given perioperative antibiotics:  Anti-infectives (From admission, onward)    Start     Dose/Rate Route Frequency Ordered Stop   06/12/21 0600  ceFAZolin (ANCEF) IVPB 2g/100 mL premix        2 g 200 mL/hr over 30 Minutes Intravenous On call to O.R. 06/11/21 1201  06/11/21 1431   06/11/21 2030  ceFAZolin (ANCEF) IVPB 2g/100 mL premix        2 g 200 mL/hr over 30 Minutes Intravenous Every 6 hours 06/11/21 1932 06/12/21 0236        Patient was given sequential compression devices, early ambulation, and chemoprophylaxis to prevent DVT.  Patient made slow progress with physical therapy with significant pain and limitation of ability to ambulate.  She did progress with physical therapy and management of her pain medication.  The patient was worried about getting into her house with 3 stairs, but it was felt that after her hospitalization she would be able to do so with assistance.  At the time of discharge she was taking by mouth and voiding without difficulty.  She had had a BM.  And she progressed with physical therapy to the point where it was felt to be safe to be discharged home with home health PT.  Patient benefited maximally from hospital stay and there were no complications.    Recent vital signs: No data found.   Recent laboratory studies: No results for input(s): WBC, HGB, HCT, PLT, NA, K, CL, CO2, BUN, CREATININE, GLUCOSE, INR, CALCIUM in the last 72 hours.  Invalid input(s): PT, 2   Discharge Medications:   Allergies as of 06/15/2021       Reactions   Food Allergy Formula Other (See Comments)  Shellfish-banana-beef-flares up her gout        Medication List     STOP taking these medications    Percocet 5-325 MG tablet Generic drug: oxyCODONE-acetaminophen       TAKE these medications    acetaminophen 500 MG tablet Commonly known as: TYLENOL Take 500 mg by mouth every 6 (six) hours as needed for mild pain or headache.   aspirin 325 MG EC tablet Take 1 tablet (325 mg total) by mouth 2 (two) times daily after a meal. What changed:  medication strength how much to take when to take this   diclofenac Sodium 1 % Gel Commonly known as: VOLTAREN Apply 1 application topically 4 (four) times daily as needed (pain).    indapamide 1.25 MG tablet Commonly known as: LOZOL Take 1.25 mg by mouth every morning.   methocarbamol 500 MG tablet Commonly known as: ROBAXIN Take 1 tablet (500 mg total) by mouth every 8 (eight) hours as needed for muscle spasms.   oxyCODONE 5 MG immediate release tablet Commonly known as: Oxy IR/ROXICODONE Take 1-2 tablets (5-10 mg total) by mouth every 6 (six) hours as needed for severe pain.   rosuvastatin 20 MG tablet Commonly known as: CRESTOR Take 20 mg by mouth at bedtime.   telmisartan 40 MG tablet Commonly known as: MICARDIS Take 40 mg by mouth every morning.        Diagnostic Studies: DG Chest 2 View  Result Date: 06/09/2021 CLINICAL DATA:  Preop hip surgery EXAM: CHEST - 2 VIEW COMPARISON:  12/09/2017 FINDINGS: No focal opacity or pleural effusion. Mild atelectasis or scar at the left base. Normal cardiomediastinal silhouette with aortic atherosclerosis. No pneumothorax. Degenerative changes of the spine. Postsurgical changes of the right humeral head. IMPRESSION: No active cardiopulmonary disease. Electronically Signed   By: Donavan Foil M.D.   On: 06/09/2021 16:41   DG Pelvis Portable  Result Date: 06/13/2021 CLINICAL DATA:  Recent hip surgery pain with movement EXAM: PORTABLE PELVIS 1-2 VIEWS COMPARISON:  06/11/2021, 05/29/2021, 02/13/2021 FINDINGS: Surgical hardware in the lumbar spine. Status post right hip replacement with intact hardware and normal alignment. No fracture is seen. Mild to moderate degenerative changes of the left hip. Slight asymmetry at pubic symphysis and left SI joint without change. IMPRESSION: Status post right hip replacement without acute osseous abnormality. Electronically Signed   By: Donavan Foil M.D.   On: 06/13/2021 16:51   DG C-Arm 1-60 Min-No Report  Result Date: 06/11/2021 Fluoroscopy was utilized by the requesting physician.  No radiographic interpretation.   DG HIP OPERATIVE UNILAT W OR W/O PELVIS RIGHT  Result Date:  06/11/2021 CLINICAL DATA:  Intra op right hip arthroplasty EXAM: OPERATIVE RIGHT HIP (WITH PELVIS IF PERFORMED) TECHNIQUE: Fluoroscopic spot image(s) were submitted for interpretation post-operatively. COMPARISON:  Pelvis radiograph 05/29/2021 FINDINGS: Two fluoroscopic spot views of the pelvis and right hip obtained in the operating room. Right hip arthroplasty in expected alignment. Fluoroscopy time 11 seconds. Dose 2.0431 mGy. IMPRESSION: Procedural fluoroscopy for right hip arthroplasty. Electronically Signed   By: Keith Rake M.D.   On: 06/11/2021 16:13    Disposition: Discharge disposition: 01-Home or Self Care       Discharge Instructions     Call MD / Call 911   Complete by: As directed    If you experience chest pain or shortness of breath, CALL 911 and be transported to the hospital emergency room.  If you develope a fever above 101 F, pus (white drainage) or increased drainage  or redness at the wound, or calf pain, call your surgeon's office.   Constipation Prevention   Complete by: As directed    Drink plenty of fluids.  Prune juice may be helpful.  You may use a stool softener, such as Colace (over the counter) 100 mg twice a day.  Use MiraLax (over the counter) for constipation as needed.   Diet - low sodium heart healthy   Complete by: As directed    Discharge instructions   Complete by: As directed     Quad Sets - Tighten up the muscle on the front of the thigh (Quad) and hold for 5-10 seconds.    Straight Leg Raises - With your knee straight (if you were given a brace, keep it on), lift the leg to 60 degrees, hold for 3 seconds, and slowly lower the leg.  Perform this exercise against resistance later as your leg gets stronger.   Leg Slides: Lying on your back, slowly slide your foot toward your buttocks, bending your knee up off the floor (only go as far as is comfortable). Then slowly slide your foot back down until your leg is flat on the floor again.   Angel  Wings: Lying on your back spread your legs to the side as far apart as you can without causing discomfort.   Hamstring Strength:  Lying on your back, push your heel against the floor with your leg straight by tightening up the muscles of your buttocks.  Repeat, but this time bend your knee to a comfortable angle, and push your heel against the floor.  You may put a pillow under the heel to make it more comfortable if necessary.   A rehabilitation program following joint replacement surgery can speed recovery and prevent re-injury in the future due to weakened muscles. Contact your doctor or a physical therapist for more information on knee rehabilitation.    CONSTIPATION  Constipation is defined medically as fewer than three stools per week and severe constipation as less than one stool per week.  Even if you have a regular bowel pattern at home, your normal regimen is likely to be disrupted due to multiple reasons following surgery.  Combination of anesthesia, postoperative narcotics, change in appetite and fluid intake all can affect your bowels.   YOU MUST use at least one of the following options; they are listed in order of increasing strength to get the job done.  They are all available over the counter, and you may need to use some, POSSIBLY even all of these options:    Drink plenty of fluids (prune juice may be helpful) and high fiber foods Colace 100 mg by mouth twice a day  Senokot for constipation as directed and as needed Dulcolax (bisacodyl), take with full glass of water  Miralax (polyethylene glycol) once or twice a day as needed.  If you have tried all these things and are unable to have a bowel movement in the first 3-4 days after surgery call either your surgeon or your primary doctor.    If you experience loose stools or diarrhea, hold the medications until you stool forms back up.  If your symptoms do not get better within 1 week or if they get worse, check with your doctor.   If you experience "the worst abdominal pain ever" or develop nausea or vomiting, please contact the office immediately for further recommendations for treatment.   ITCHING:  If you experience itching with your medications, try taking only a  single pain pill, or even half a pain pill at a time.  You can also use Benadryl over the counter for itching or also to help with sleep.   TED HOSE STOCKINGS:  Use stockings on both legs until for at least 2 weeks or as directed by physician office. They may be removed at night for sleeping.  MEDICATIONS:  See your medication summary on the "After Visit Summary" that nursing will review with you.  You may have some home medications which will be placed on hold until you complete the course of blood thinner medication.  It is important for you to complete the blood thinner medication as prescribed.  PRECAUTIONS:  If you experience chest pain or shortness of breath - call 911 immediately for transfer to the hospital emergency department.   If you develop a fever greater that 101 F, purulent drainage from wound, increased redness or drainage from wound, foul odor from the wound/dressing, or calf pain - CONTACT YOUR SURGEON.                                                   FOLLOW-UP APPOINTMENTS:  If you do not already have a post-op appointment, please call the office for an appointment to be seen by your surgeon.  Guidelines for how soon to be seen are listed in your "After Visit Summary", but are typically between 1-4 weeks after surgery.  OTHER INSTRUCTIONS:   Knee Replacement:  Do not place pillow under knee, focus on keeping the knee straight while resting. CPM instructions: 0-90 degrees, 2 hours in the morning, 2 hours in the afternoon, and 2 hours in the evening. Place foam block, curve side up under heel at all times except when in CPM or when walking.  DO NOT modify, tear, cut, or change the foam block in any way.  POST-OPERATIVE OPIOID TAPER  INSTRUCTIONS:  It is important to wean off of your opioid medication as soon as possible. If you do not need pain medication after your surgery it is ok to stop day one.  Opioids include:  o Codeine, Hydrocodone(Norco, Vicodin), Oxycodone(Percocet, oxycontin) and hydromorphone amongst others.   Long term and even short term use of opiods can cause:  o Increased pain response  o Dependence  o Constipation  o Depression  o Respiratory depression  o And more.   Withdrawal symptoms can include  o Flu like symptoms  o Nausea, vomiting  o And more  Techniques to manage these symptoms  o Hydrate well  o Eat regular healthy meals  o Stay active  o Use relaxation techniques(deep breathing, meditating, yoga)  Do Not substitute Alcohol to help with tapering  If you have been on opioids for less than two weeks and do not have pain than it is ok to stop all together.   Plan to wean off of opioids  o This plan should start within one week post op of your joint replacement.  o Maintain the same interval or time between taking each dose and first decrease the dose.   o Cut the total daily intake of opioids by one tablet each day  o Next start to increase the time between doses.  o The last dose that should be eliminated is the evening dose.   MAKE SURE YOU:   Understand these instructions.  Get help right away if you are not doing well or get worse.    Thank you for letting us be a part of your medical care team.  It is a privilege we respect greatly.  We hope these instructions will help you stay on track for a fast and full recovery!   Dental Antibiotics:  In most cases prophylactic antibiotics for Dental procdeures after total joint surgery are not necessary.  Exceptions are as follows:  1. History of prior total joint infection  2. Severely immunocompromised (Organ Transplant, cancer chemotherapy, Rheumatoid biologic meds such as Arcadia)  3. Poorly controlled diabetes  (A1C &gt; 8.0, blood glucose over 200)  If you have one of these conditions, contact your surgeon for an antibiotic prescription, prior to your dental procedure.   Increase activity slowly as tolerated   Complete by: As directed    Post-operative opioid taper instructions:   Complete by: As directed    POST-OPERATIVE OPIOID TAPER INSTRUCTIONS: It is important to wean off of your opioid medication as soon as possible. If you do not need pain medication after your surgery it is ok to stop day one. Opioids include: Codeine, Hydrocodone(Norco, Vicodin), Oxycodone(Percocet, oxycontin) and hydromorphone amongst others.  Long term and even short term use of opiods can cause: Increased pain response Dependence Constipation Depression Respiratory depression And more.  Withdrawal symptoms can include Flu like symptoms Nausea, vomiting And more Techniques to manage these symptoms Hydrate well Eat regular healthy meals Stay active Use relaxation techniques(deep breathing, meditating, yoga) Do Not substitute Alcohol to help with tapering If you have been on opioids for less than two weeks and do not have pain than it is ok to stop all together.  Plan to wean off of opioids This plan should start within one week post op of your joint replacement. Maintain the same interval or time between taking each dose and first decrease the dose.  Cut the total daily intake of opioids by one tablet each day Next start to increase the time between doses. The last dose that should be eliminated is the evening dose.           Follow-up Information     [name], MD. Go on 06/24/2021.   Specialty: Orthopedic Surgery Why: Your appointment is scheduled for 9:45. Contact information: [place] [address] [phone]         [facility] Follow up.   Why: HHPT will see you at home for 5 visits prior to starting outpatient physical therapy        [facility] Go on 06/24/2021.   Why: Your appointment is scheduled for 11:20. Please go over to the therapy side after your appointment with Dr Berenice Primas. Contact information: [facility] [place] [address] [phone]                  Signed: Erlene Senters 06/22/2021, 2:17 PM

## 2021-06-23 DIAGNOSIS — M069 Rheumatoid arthritis, unspecified: Secondary | ICD-10-CM | POA: Diagnosis not present

## 2021-06-23 DIAGNOSIS — Z9181 History of falling: Secondary | ICD-10-CM | POA: Diagnosis not present

## 2021-06-23 DIAGNOSIS — Z96641 Presence of right artificial hip joint: Secondary | ICD-10-CM | POA: Diagnosis not present

## 2021-06-23 DIAGNOSIS — J45909 Unspecified asthma, uncomplicated: Secondary | ICD-10-CM | POA: Diagnosis not present

## 2021-06-23 DIAGNOSIS — Z471 Aftercare following joint replacement surgery: Secondary | ICD-10-CM | POA: Diagnosis not present

## 2021-06-23 DIAGNOSIS — Z96653 Presence of artificial knee joint, bilateral: Secondary | ICD-10-CM | POA: Diagnosis not present

## 2021-06-23 DIAGNOSIS — I10 Essential (primary) hypertension: Secondary | ICD-10-CM | POA: Diagnosis not present

## 2021-06-24 DIAGNOSIS — Z9889 Other specified postprocedural states: Secondary | ICD-10-CM | POA: Diagnosis not present

## 2021-06-25 DIAGNOSIS — Z471 Aftercare following joint replacement surgery: Secondary | ICD-10-CM | POA: Diagnosis not present

## 2021-06-25 DIAGNOSIS — Z96641 Presence of right artificial hip joint: Secondary | ICD-10-CM | POA: Diagnosis not present

## 2021-06-25 DIAGNOSIS — J45909 Unspecified asthma, uncomplicated: Secondary | ICD-10-CM | POA: Diagnosis not present

## 2021-06-25 DIAGNOSIS — I10 Essential (primary) hypertension: Secondary | ICD-10-CM | POA: Diagnosis not present

## 2021-06-25 DIAGNOSIS — Z96653 Presence of artificial knee joint, bilateral: Secondary | ICD-10-CM | POA: Diagnosis not present

## 2021-06-25 DIAGNOSIS — M069 Rheumatoid arthritis, unspecified: Secondary | ICD-10-CM | POA: Diagnosis not present

## 2021-06-25 DIAGNOSIS — Z9181 History of falling: Secondary | ICD-10-CM | POA: Diagnosis not present

## 2021-06-27 DIAGNOSIS — Z96641 Presence of right artificial hip joint: Secondary | ICD-10-CM | POA: Diagnosis not present

## 2021-07-07 DIAGNOSIS — Z96641 Presence of right artificial hip joint: Secondary | ICD-10-CM | POA: Diagnosis not present

## 2021-07-09 DIAGNOSIS — Z96641 Presence of right artificial hip joint: Secondary | ICD-10-CM | POA: Diagnosis not present

## 2021-07-23 DIAGNOSIS — Z96641 Presence of right artificial hip joint: Secondary | ICD-10-CM | POA: Diagnosis not present

## 2021-07-24 DIAGNOSIS — Z9889 Other specified postprocedural states: Secondary | ICD-10-CM | POA: Diagnosis not present

## 2021-07-24 DIAGNOSIS — M25551 Pain in right hip: Secondary | ICD-10-CM | POA: Diagnosis not present

## 2021-07-24 DIAGNOSIS — M545 Low back pain, unspecified: Secondary | ICD-10-CM | POA: Diagnosis not present

## 2021-07-25 DIAGNOSIS — Z96641 Presence of right artificial hip joint: Secondary | ICD-10-CM | POA: Diagnosis not present

## 2021-08-06 DIAGNOSIS — I10 Essential (primary) hypertension: Secondary | ICD-10-CM | POA: Diagnosis not present

## 2021-08-06 DIAGNOSIS — E785 Hyperlipidemia, unspecified: Secondary | ICD-10-CM | POA: Diagnosis not present

## 2021-08-06 DIAGNOSIS — Z720 Tobacco use: Secondary | ICD-10-CM | POA: Diagnosis not present

## 2021-08-06 DIAGNOSIS — M130 Polyarthritis, unspecified: Secondary | ICD-10-CM | POA: Diagnosis not present

## 2021-08-11 ENCOUNTER — Other Ambulatory Visit: Payer: Self-pay

## 2021-08-11 ENCOUNTER — Inpatient Hospital Stay (HOSPITAL_COMMUNITY)
Admission: EM | Admit: 2021-08-11 | Discharge: 2021-08-15 | DRG: 418 | Disposition: A | Discharge status: Home Health | Payer: Medicare Other | Attending: Surgery | Admitting: Surgery

## 2021-08-11 DIAGNOSIS — Z96641 Presence of right artificial hip joint: Secondary | ICD-10-CM | POA: Diagnosis not present

## 2021-08-11 DIAGNOSIS — K810 Acute cholecystitis: Secondary | ICD-10-CM | POA: Diagnosis not present

## 2021-08-11 DIAGNOSIS — Z91013 Allergy to seafood: Secondary | ICD-10-CM | POA: Diagnosis not present

## 2021-08-11 DIAGNOSIS — R1013 Epigastric pain: Secondary | ICD-10-CM

## 2021-08-11 DIAGNOSIS — G4730 Sleep apnea, unspecified: Secondary | ICD-10-CM | POA: Diagnosis not present

## 2021-08-11 DIAGNOSIS — K8020 Calculus of gallbladder without cholecystitis without obstruction: Secondary | ICD-10-CM | POA: Diagnosis not present

## 2021-08-11 DIAGNOSIS — I10 Essential (primary) hypertension: Secondary | ICD-10-CM | POA: Diagnosis present

## 2021-08-11 DIAGNOSIS — Z79899 Other long term (current) drug therapy: Secondary | ICD-10-CM

## 2021-08-11 DIAGNOSIS — M7541 Impingement syndrome of right shoulder: Secondary | ICD-10-CM | POA: Diagnosis present

## 2021-08-11 DIAGNOSIS — Z90710 Acquired absence of both cervix and uterus: Secondary | ICD-10-CM

## 2021-08-11 DIAGNOSIS — M19011 Primary osteoarthritis, right shoulder: Secondary | ICD-10-CM | POA: Diagnosis present

## 2021-08-11 DIAGNOSIS — M109 Gout, unspecified: Secondary | ICD-10-CM | POA: Diagnosis present

## 2021-08-11 DIAGNOSIS — Z96643 Presence of artificial hip joint, bilateral: Secondary | ICD-10-CM | POA: Diagnosis present

## 2021-08-11 DIAGNOSIS — R6889 Other general symptoms and signs: Secondary | ICD-10-CM | POA: Diagnosis not present

## 2021-08-11 DIAGNOSIS — Z981 Arthrodesis status: Secondary | ICD-10-CM

## 2021-08-11 DIAGNOSIS — R109 Unspecified abdominal pain: Secondary | ICD-10-CM | POA: Diagnosis not present

## 2021-08-11 DIAGNOSIS — Z20822 Contact with and (suspected) exposure to covid-19: Secondary | ICD-10-CM | POA: Diagnosis not present

## 2021-08-11 DIAGNOSIS — Z9049 Acquired absence of other specified parts of digestive tract: Secondary | ICD-10-CM

## 2021-08-11 DIAGNOSIS — I69354 Hemiplegia and hemiparesis following cerebral infarction affecting left non-dominant side: Secondary | ICD-10-CM

## 2021-08-11 DIAGNOSIS — K5900 Constipation, unspecified: Secondary | ICD-10-CM | POA: Diagnosis present

## 2021-08-11 DIAGNOSIS — K8010 Calculus of gallbladder with chronic cholecystitis without obstruction: Secondary | ICD-10-CM | POA: Diagnosis not present

## 2021-08-11 DIAGNOSIS — Z87891 Personal history of nicotine dependence: Secondary | ICD-10-CM | POA: Diagnosis not present

## 2021-08-11 DIAGNOSIS — G629 Polyneuropathy, unspecified: Secondary | ICD-10-CM | POA: Diagnosis present

## 2021-08-11 DIAGNOSIS — Z743 Need for continuous supervision: Secondary | ICD-10-CM | POA: Diagnosis not present

## 2021-08-11 DIAGNOSIS — K429 Umbilical hernia without obstruction or gangrene: Secondary | ICD-10-CM | POA: Diagnosis not present

## 2021-08-11 DIAGNOSIS — M5410 Radiculopathy, site unspecified: Secondary | ICD-10-CM | POA: Diagnosis not present

## 2021-08-11 DIAGNOSIS — Z7982 Long term (current) use of aspirin: Secondary | ICD-10-CM

## 2021-08-11 DIAGNOSIS — M1611 Unilateral primary osteoarthritis, right hip: Secondary | ICD-10-CM | POA: Diagnosis not present

## 2021-08-11 DIAGNOSIS — Z91018 Allergy to other foods: Secondary | ICD-10-CM

## 2021-08-11 DIAGNOSIS — K219 Gastro-esophageal reflux disease without esophagitis: Secondary | ICD-10-CM | POA: Diagnosis not present

## 2021-08-11 DIAGNOSIS — I700 Atherosclerosis of aorta: Secondary | ICD-10-CM | POA: Diagnosis not present

## 2021-08-11 DIAGNOSIS — E876 Hypokalemia: Secondary | ICD-10-CM | POA: Diagnosis not present

## 2021-08-11 DIAGNOSIS — R1011 Right upper quadrant pain: Secondary | ICD-10-CM | POA: Diagnosis not present

## 2021-08-11 DIAGNOSIS — Z8719 Personal history of other diseases of the digestive system: Secondary | ICD-10-CM

## 2021-08-11 DIAGNOSIS — K828 Other specified diseases of gallbladder: Secondary | ICD-10-CM | POA: Diagnosis not present

## 2021-08-11 DIAGNOSIS — R1111 Vomiting without nausea: Secondary | ICD-10-CM | POA: Diagnosis not present

## 2021-08-11 DIAGNOSIS — E785 Hyperlipidemia, unspecified: Secondary | ICD-10-CM | POA: Diagnosis not present

## 2021-08-11 LAB — CBC WITH DIFFERENTIAL/PLATELET
Abs Immature Granulocytes: 0.04 10*3/uL (ref 0.00–0.07)
Basophils Absolute: 0 10*3/uL (ref 0.0–0.1)
Basophils Relative: 0 %
Eosinophils Absolute: 0 10*3/uL (ref 0.0–0.5)
Eosinophils Relative: 1 %
HCT: 35.2 % — ABNORMAL LOW (ref 36.0–46.0)
Hemoglobin: 11.4 g/dL — ABNORMAL LOW (ref 12.0–15.0)
Immature Granulocytes: 1 %
Lymphocytes Relative: 22 %
Lymphs Abs: 1.9 10*3/uL (ref 0.7–4.0)
MCH: 28.6 pg (ref 26.0–34.0)
MCHC: 32.4 g/dL (ref 30.0–36.0)
MCV: 88.4 fL (ref 80.0–100.0)
Monocytes Absolute: 0.6 10*3/uL (ref 0.1–1.0)
Monocytes Relative: 7 %
Neutro Abs: 6.1 10*3/uL (ref 1.7–7.7)
Neutrophils Relative %: 69 %
Platelets: 301 10*3/uL (ref 150–400)
RBC: 3.98 MIL/uL (ref 3.87–5.11)
RDW: 13.9 % (ref 11.5–15.5)
WBC: 8.6 10*3/uL (ref 4.0–10.5)
nRBC: 0 % (ref 0.0–0.2)

## 2021-08-11 LAB — COMPREHENSIVE METABOLIC PANEL
ALT: 21 U/L (ref 0–44)
AST: 32 U/L (ref 15–41)
Albumin: 3.7 g/dL (ref 3.5–5.0)
Alkaline Phosphatase: 142 U/L — ABNORMAL HIGH (ref 38–126)
Anion gap: 10 (ref 5–15)
BUN: 9 mg/dL (ref 8–23)
CO2: 27 mmol/L (ref 22–32)
Calcium: 9.7 mg/dL (ref 8.9–10.3)
Chloride: 104 mmol/L (ref 98–111)
Creatinine, Ser: 0.73 mg/dL (ref 0.44–1.00)
GFR, Estimated: >60 mL/min (ref >60)
Glucose, Bld: 101 mg/dL — ABNORMAL HIGH (ref 70–99)
Potassium: 3.3 mmol/L — ABNORMAL LOW (ref 3.5–5.1)
Sodium: 141 mmol/L (ref 135–145)
Total Bilirubin: 0.4 mg/dL (ref 0.3–1.2)
Total Protein: 8.1 g/dL (ref 6.5–8.1)

## 2021-08-11 LAB — URINALYSIS, ROUTINE W REFLEX MICROSCOPIC
Bilirubin Urine: NEGATIVE
Glucose, UA: NEGATIVE mg/dL
Hgb urine dipstick: NEGATIVE
Ketones, ur: NEGATIVE mg/dL
Leukocytes,Ua: NEGATIVE
Nitrite: NEGATIVE
Protein, ur: NEGATIVE mg/dL
Specific Gravity, Urine: 1.012 (ref 1.005–1.030)
pH: 7 (ref 5.0–8.0)

## 2021-08-11 LAB — TROPONIN I (HIGH SENSITIVITY): Troponin I (High Sensitivity): 5 ng/L (ref <18)

## 2021-08-11 LAB — LIPASE, BLOOD: Lipase: 36 U/L (ref 11–51)

## 2021-08-11 MED ORDER — ONDANSETRON HCL 4 MG/2ML IJ SOLN
4.0000 mg | Freq: Once | INTRAMUSCULAR | Status: AC
  Administered 2021-08-11: 4 mg via INTRAVENOUS
  Filled 2021-08-11: qty 2

## 2021-08-11 MED ORDER — FENTANYL CITRATE PF 50 MCG/ML IJ SOSY
50.0000 ug | PREFILLED_SYRINGE | Freq: Once | INTRAMUSCULAR | Status: AC
  Administered 2021-08-11: 50 ug via INTRAVENOUS
  Filled 2021-08-11: qty 1

## 2021-08-11 NOTE — ED Triage Notes (Signed)
Pt reports abd pain since this morning. Pt denies vomiting, reports nausea.

## 2021-08-11 NOTE — ED Provider Notes (Signed)
[facility] EMERGENCY DEPARTMENT Provider Note   CSN: [id number] Arrival date & time: 08/11/21  2041     History Chief Complaint  Patient presents with   Abdominal Pain   Nausea    Ellen Henry is a 75 y.o. female.  The history is provided by the patient and medical records.  Abdominal Pain Associated symptoms: nausea    75 year old female with history of arthritis, GERD, hypertension, neuropathy, sleep apnea, prior stroke, presenting to the ED with epigastric abdominal pain.  States this morning she got up and ate watermelon that was not quite right along with some barbecue and afterwards developed significant epigastric pain.  States it is sharp and stabbing and has been ongoing all day.  She reports associated nausea but denies any vomiting.  She tried to make herself throw up as she thought it was due to the food she ate.  She did not have a bowel movement today.  She is not had any noted fevers but has been feeling hot and cold with her fluctuations in pain.  She tried taking Tums and Tagamet without any relief.  She has had prior appendectomy and hysterectomy.  Past Medical History:  Diagnosis Date   Acute kidney failure due to procedure    Arthritis    Complication of anesthesia    after nerve block difficult breathing (at [place] [facility] [address] transported to [facility])   Dry skin    GERD (gastroesophageal reflux disease)    "years ago", diet controlled   Gout    H/O pyelonephritis    as a child   Heart murmur    since birth, denies any problems    History of sepsis    after spinal surgery   Hypertension    Impingement syndrome of right shoulder    Neuropathy    Osteoarthritis of AC (acromioclavicular) joint    Right   Pneumonia    Radiculopathy    Rotator cuff tear, right    Sleep apnea    does not use cpap   Stroke ([place]) 12/15/2016   left side weakness    Patient Active Problem List   Diagnosis Date Noted   Status post total hip  replacement, right 06/13/2021   Primary osteoarthritis of right hip 06/11/2021   Status post total replacement of right hip 06/11/2021   Complete tear of right rotator cuff 01/18/2018   Impingement syndrome of right shoulder 01/18/2018   AC (acromioclavicular) arthritis 01/18/2018   Rotator cuff tear 01/18/2018   Low oxygen saturation 12/09/2017   Chronic constipation    Failed back syndrome    Chronic pain syndrome    Radicular pain    Chronic bilateral low back pain without sciatica    Dysphagia, post-stroke    Left hemiparesis ([place])    Acute ischemic stroke ([place]) 12/15/2016   Urinary retention 02/25/2015   Primary osteoarthritis of left knee 02/15/2015   Morbid obesity ([place]) 02/15/2015   Essential hypertension, benign 09/07/2014   Hyperlipidemia 09/05/2014   Gout 09/05/2014   Neuropathy 09/05/2014   Radiculopathy 08/29/2014    Past Surgical History:  Procedure Laterality Date   ABDOMINAL EXPOSURE N/A 08/29/2014   Procedure: ABDOMINAL EXPOSURE;  Surgeon: Rosetta Posner, MD;  Location: [place];  Service: Vascular;  Laterality: N/A;   ABDOMINAL HYSTERECTOMY     ANTERIOR LUMBAR FUSION N/A 08/29/2014   Procedure: ANTERIOR LUMBAR FUSION 1 LEVEL;  Surgeon: Sinclair Ship, MD;  Location: [place];  Service: Orthopedics;  Laterality: N/A;  Lumbar 4-5 anterior lumbar interbody fusion with allograft and instrumentation.   APPENDECTOMY     BACK SURGERY  08/28/2014 and 08/29/2014   lumbar fusion   CARPAL TUNNEL RELEASE Right    release done twice   COLONOSCOPY     JOINT REPLACEMENT     left knee replacement     lower intestine removal     REPLACEMENT TOTAL KNEE  2010   rigth knee   SHOULDER ARTHROSCOPY Right 01/18/2018   Procedure: RIGHT SHOULDER ARTHROSCOPY, SUBACROMIAL DECOMPRESSION, DISTAL CLAVICLE RESECTION WITH MINI OPEN ROTATOR CUFF REPAIR;  Surgeon: Garald Balding, MD;  Location: [place];  Service: Orthopedics;  Laterality: Right;   TONSILLECTOMY     TOTAL HIP ARTHROPLASTY  Right 06/11/2021   Procedure: TOTAL HIP ARTHROPLASTY ANTERIOR APPROACH;  Surgeon: [name], MD;  Location: [facility];  Service: Orthopedics;  Laterality: Right;   TOTAL KNEE ARTHROPLASTY Left 02/15/2015   Procedure: TOTAL KNEE ARTHROPLASTY;  Surgeon: [name], MD;  Location: [place];  Service: Orthopedics;  Laterality: Left;     OB History     Gravida  0   Para  0   Term  0   Preterm  0   AB  0   Living         SAB  0   IAB  0   Ectopic  0   Multiple      Live Births              Family History  Problem Relation Age of Onset   Varicose Veins Mother     Social History   Tobacco Use   Smoking status: Former    Packs/day: 0.50    Types: Cigarettes    Quit date: 04/04/2021    Years since quitting: 0.3   Smokeless tobacco: Never  Vaping Use   Vaping Use: Never used  Substance Use Topics   Alcohol use: No   Drug use: No    Comment: sometimes will take a vicodin from friend    Home Medications Prior to Admission medications   Medication Sig Start Date End Date Taking? Authorizing Provider  acetaminophen (TYLENOL) 500 MG tablet Take 500 mg by mouth every 6 (six) hours as needed for mild pain or headache.    [provider]  aspirin EC 325 MG EC tablet Take 1 tablet (325 mg total) by mouth 2 (two) times daily after a meal. 06/15/21   Porterfield, Museum/gallery conservator, PA-C  diclofenac Sodium (VOLTAREN) 1 % GEL Apply 1 application topically 4 (four) times daily as needed (pain). 04/03/21   [provider]  indapamide (LOZOL) 1.25 MG tablet Take 1.25 mg by mouth every morning. 05/02/21   [provider]  methocarbamol (ROBAXIN) 500 MG tablet Take 1 tablet (500 mg total) by mouth every 8 (eight) hours as needed for muscle spasms. 06/15/21   Porterfield, Amber, PA-C  oxyCODONE (OXY IR/ROXICODONE) 5 MG immediate release tablet Take 1-2 tablets (5-10 mg total) by mouth every 6 (six) hours as needed for severe pain. 06/15/21   Porterfield, Amber, PA-C   rosuvastatin (CRESTOR) 20 MG tablet Take 20 mg by mouth at bedtime. 04/30/21   [provider]  telmisartan (MICARDIS) 40 MG tablet Take 40 mg by mouth every morning. 04/30/21   [provider]    Allergies    Food allergy formula  Review of Systems   Review of Systems  Gastrointestinal:  Positive for abdominal pain and nausea.  All  other systems reviewed and are negative.  Physical Exam Updated Vital Signs BP (!) 169/92   Pulse (!) 55   Temp 98.4 F (36.9 C) (Oral)   Resp 18   Ht 5' (1.524 m)   Wt 97.1 kg   SpO2 99%   BMI 41.79 kg/m   Physical Exam Vitals and nursing note reviewed.  Constitutional:      Appearance: She is well-developed.     Comments: Writhing on stretcher, moaning  HENT:     Head: Normocephalic and atraumatic.  Eyes:     Conjunctiva/sclera: Conjunctivae normal.     Pupils: Pupils are equal, round, and reactive to light.  Cardiovascular:     Rate and Rhythm: Normal rate and regular rhythm.     Heart sounds: Normal heart sounds.  Pulmonary:     Effort: Pulmonary effort is normal.     Breath sounds: Normal breath sounds.  Abdominal:     General: Bowel sounds are normal.     Palpations: Abdomen is soft.     Tenderness: There is abdominal tenderness in the epigastric area.  Musculoskeletal:        General: Normal range of motion.     Cervical back: Normal range of motion.  Skin:    General: Skin is warm and dry.  Neurological:     Mental Status: She is alert and oriented to person, place, and time.    ED Results / Procedures / Treatments   Labs (all labs ordered are listed, but only abnormal results are displayed) Labs Reviewed  COMPREHENSIVE METABOLIC PANEL - Abnormal; Notable for the following components:      Result Value   Potassium 3.3 (*)    Glucose, Bld 101 (*)    Alkaline Phosphatase 142 (*)    All other components within normal limits  CBC WITH DIFFERENTIAL/PLATELET - Abnormal; Notable for the following  components:   Hemoglobin 11.4 (*)    HCT 35.2 (*)    All other components within normal limits  URINE CULTURE  RESP PANEL BY RT-PCR (FLU A&B, COVID) ARPGX2  LIPASE, BLOOD  URINALYSIS, ROUTINE W REFLEX MICROSCOPIC  TROPONIN I (HIGH SENSITIVITY)  TROPONIN I (HIGH SENSITIVITY)    EKG EKG Interpretation  Date/Time:  Tuesday August 12 2021 01:04:34 EDT Ventricular Rate:  65 PR Interval:  166 QRS Duration: 114 QT Interval:  443 QTC Calculation: 461 R Axis:   75 Text Interpretation: Sinus rhythm Incomplete right bundle branch block Low voltage, precordial leads No acute changes Confirmed by Addison Lank [phone]) on 08/12/2021 2:29:19 AM  Radiology CT ABDOMEN PELVIS W CONTRAST  Result Date: 08/12/2021 CLINICAL DATA:  Epigastric pain epigastric pain, nausea EXAM: CT ABDOMEN AND PELVIS WITH CONTRAST TECHNIQUE: Multidetector CT imaging of the abdomen and pelvis was performed using the standard protocol following bolus administration of intravenous contrast. CONTRAST:  63m OMNIPAQUE IOHEXOL 350 MG/ML SOLN COMPARISON:  None. FINDINGS: Lower chest: No acute abnormality. Hepatobiliary: Cholelithiasis is noted. The gallbladder is distended and there is trace pericholecystic fluid within the gallbladder fossa and subtle pericholecystic inflammatory stranding best seen on coronal imaging. Together, the findings are suspicious for changes of acute cholecystitis. The liver is unremarkable. No intra or extrahepatic biliary ductal dilation. Pancreas: Unremarkable Spleen: Unremarkable Adrenals/Urinary Tract: The adrenal glands are unremarkable. The kidneys are normal. Streak artifact partially obscures the bladder, however the visualized portion is unremarkable. Stomach/Bowel: The stomach, small bowel, and large bowel are unremarkable. The appendix is absent. No free intraperitoneal gas or fluid. Vascular/Lymphatic: Moderate  aortoiliac atherosclerotic calcification. No aortic aneurysm. Circumaortic left renal  vein. No pathologic adenopathy within the abdomen and pelvis. Reproductive: Status post hysterectomy. No adnexal masses. Other: Tiny fat containing umbilical hernia. Musculoskeletal: Right total hip arthroplasty and L4-5 lumbar fusion with instrumentation has been performed. Right L4 hemilaminectomy and resection of the spinous process noted. No acute bone abnormality. No lytic or blastic bone lesion. IMPRESSION: Cholelithiasis with findings suggesting early acute cholecystitis. Correlation with liver enzymes is recommended. Right upper quadrant sonography may be helpful for confirmation. Aortic Atherosclerosis (ICD10-I70.0). Electronically Signed   By: Fidela Salisbury M.D.   On: 08/12/2021 01:06   US Abdomen Limited RUQ (LIVER/GB)  Result Date: 08/12/2021 CLINICAL DATA:  Right upper quadrant pain EXAM: ULTRASOUND ABDOMEN LIMITED RIGHT UPPER QUADRANT COMPARISON:  CT 08/12/2021 FINDINGS: Gallbladder: Multiple gallstones layering within the gallbladder measuring up to 1.6 cm. Gallbladder wall is thickened measuring 5 mm. Small amount of pericholecystic fluid. Patient was tender over the gallbladder during the study. Common bile duct: Diameter: Dilated measuring 13 mm. Distal duct is obscured by overlying bowel gas. No visible ductal stones in the visualized duct. Liver: No focal lesion identified. Within normal limits in parenchymal echogenicity. Portal vein is patent on color Doppler imaging with normal direction of blood flow towards the liver. Other: None. IMPRESSION: Cholelithiasis. Gallbladder wall thickening, pericholecystic fluid and tenderness over the gallbladder. The study concerning for active diverticulitis. Common bile duct dilatation. No visible ductal stones although the distal duct is obscured by overlying bowel gas. Electronically Signed   By: Rolm Baptise M.D.   On: 08/12/2021 02:05    Procedures Procedures   Medications Ordered in ED Medications  cefTRIAXone (ROCEPHIN) 2 g in sodium  chloride 0.9 % 100 mL IVPB (has no administration in time range)  metroNIDAZOLE (FLAGYL) IVPB 500 mg (has no administration in time range)  fentaNYL (SUBLIMAZE) injection 50 mcg (50 mcg Intravenous Given 08/11/21 2328)  ondansetron (ZOFRAN) injection 4 mg (4 mg Intravenous Given 08/11/21 2328)  iohexol (OMNIPAQUE) 350 MG/ML injection 75 mL (75 mLs Intravenous Contrast Given 08/12/21 0056)  morphine 4 MG/ML injection 4 mg (4 mg Intravenous Given 08/12/21 0140)    ED Course  I have reviewed the triage vital signs and the nursing notes.  Pertinent labs & imaging results that were available during my care of the patient were reviewed by me and considered in my medical decision making (see chart for details).    MDM Rules/Calculators/A&P                           57-year-old female presenting to the ED with epigastric pain that began this morning.  Reports she ate some unripe watermelon and barbecue that she thinks may have been old.  She has had sharp, stabbing pain in epigastrium since then.  She reports some nausea but denies vomiting.  She is uncomfortable appearing on exam, writhing in stretcher.  She is focally tender in the epigastrium.  Will obtain labs, CT.  CT with findings of acute cholecystitis.  This is confirmed with right upper quadrant ultrasound.  Started Rocephin and Flagyl.  COVID screen sent.  Discussed with general surgery, Dr. Zenia Resides-- she has reviewed case and will admit for ongoing care.  Final Clinical Impression(s) / ED Diagnoses Final diagnoses:  Epigastric pain  Acute cholecystitis    Rx / DC Orders ED Discharge Orders     None        Larene Pickett,  PA-C 08/12/21 [id number]    Sherwood Gambler, MD 08/15/21 1755

## 2021-08-11 NOTE — ED Provider Notes (Addendum)
Emergency Medicine Provider Triage Evaluation Note  Ellen Henry , a 75 y.o. female  was evaluated in triage.  Pt complains of abdominal pain since this morning.  She states that she got up and ate a watermelon, and some chicken and then developed severe epigastric abdominal pain.  She denies any fevers however notes she has been feeling hot and cold.  She denies any nausea, vomiting, or diarrhea.  She has been trying to make her self vomit without success.  Review of Systems  Positive: Abdominal pain Negative: Vomiting  Physical Exam  BP (!) 159/92 (BP Location: Left Arm)   Pulse 63   Temp 98.2 F (36.8 C)   Resp 15   Ht 5' (1.524 m)   Wt 97.1 kg   SpO2 99%   BMI 41.79 kg/m  Gen:   Awake, appears uncomfortable Resp:  Normal effort  MSK:   Moves extremities without difficulty  Other:  Speech is not slurred.  Medical Decision Making  Medically screening exam initiated at 9:00 PM.  Appropriate orders placed.  Ellen Henry was informed that the remainder of the evaluation will be completed by another provider, this initial triage assessment does not replace that evaluation, and the importance of remaining in the ED until their evaluation is complete.  Note: Portions of this report may have been transcribed using voice recognition software. Every effort was made to ensure accuracy; however, inadvertent computerized transcription errors may be present    Ellen Henry 08/11/21 2100  Called charge to get patient back sooner, based on her degree of pain. At 2120  Note: Portions of this report may have been transcribed using voice recognition software. Every effort was made to ensure accuracy; however, inadvertent computerized transcription errors may be present    Ellen Henry 08/11/21 2122    Ellen Rank, MD 08/12/21 2127

## 2021-08-12 ENCOUNTER — Observation Stay (HOSPITAL_COMMUNITY): Payer: Medicare Other | Admitting: Anesthesiology

## 2021-08-12 ENCOUNTER — Emergency Department (HOSPITAL_COMMUNITY): Payer: Medicare Other

## 2021-08-12 ENCOUNTER — Encounter (HOSPITAL_COMMUNITY): Payer: Self-pay

## 2021-08-12 ENCOUNTER — Encounter (HOSPITAL_COMMUNITY): Admission: EM | Disposition: A | Discharge status: Home Health | Payer: Self-pay | Source: Home / Self Care

## 2021-08-12 DIAGNOSIS — K8010 Calculus of gallbladder with chronic cholecystitis without obstruction: Secondary | ICD-10-CM | POA: Diagnosis not present

## 2021-08-12 DIAGNOSIS — K810 Acute cholecystitis: Secondary | ICD-10-CM | POA: Diagnosis present

## 2021-08-12 HISTORY — PX: CHOLECYSTECTOMY: SHX55

## 2021-08-12 LAB — RESP PANEL BY RT-PCR (FLU A&B, COVID) ARPGX2
Influenza A by PCR: NEGATIVE
Influenza B by PCR: NEGATIVE
SARS Coronavirus 2 by RT PCR: NEGATIVE

## 2021-08-12 SURGERY — LAPAROSCOPIC CHOLECYSTECTOMY
Anesthesia: General

## 2021-08-12 MED ORDER — CHLORHEXIDINE GLUCONATE 0.12 % MT SOLN
OROMUCOSAL | Status: AC
  Administered 2021-08-12: 15 mL
  Filled 2021-08-12: qty 15

## 2021-08-12 MED ORDER — ONDANSETRON HCL 4 MG/2ML IJ SOLN
INTRAMUSCULAR | Status: AC
  Filled 2021-08-12: qty 2

## 2021-08-12 MED ORDER — PROPOFOL 10 MG/ML IV BOLUS
INTRAVENOUS | Status: DC | PRN
  Administered 2021-08-12: 30 mg via INTRAVENOUS
  Administered 2021-08-12: 100 mg via INTRAVENOUS

## 2021-08-12 MED ORDER — MORPHINE SULFATE (PF) 4 MG/ML IV SOLN
4.0000 mg | Freq: Once | INTRAVENOUS | Status: AC
  Administered 2021-08-12: 4 mg via INTRAVENOUS
  Filled 2021-08-12: qty 1

## 2021-08-12 MED ORDER — ROCURONIUM BROMIDE 10 MG/ML (PF) SYRINGE
PREFILLED_SYRINGE | INTRAVENOUS | Status: DC | PRN
  Administered 2021-08-12: 40 mg via INTRAVENOUS
  Administered 2021-08-12: 10 mg via INTRAVENOUS

## 2021-08-12 MED ORDER — FENTANYL CITRATE (PF) 250 MCG/5ML IJ SOLN
INTRAMUSCULAR | Status: AC
  Filled 2021-08-12: qty 5

## 2021-08-12 MED ORDER — IOHEXOL 350 MG/ML SOLN
75.0000 mL | Freq: Once | INTRAVENOUS | Status: AC | PRN
  Administered 2021-08-12: 75 mL via INTRAVENOUS

## 2021-08-12 MED ORDER — ACETAMINOPHEN 325 MG PO TABS
650.0000 mg | ORAL_TABLET | Freq: Four times a day (QID) | ORAL | Status: DC | PRN
Start: 2021-08-12 — End: 2021-08-12
  Administered 2021-08-12: 650 mg via ORAL
  Filled 2021-08-12: qty 2

## 2021-08-12 MED ORDER — FENTANYL CITRATE (PF) 250 MCG/5ML IJ SOLN
INTRAMUSCULAR | Status: DC | PRN
  Administered 2021-08-12 (×4): 50 ug via INTRAVENOUS

## 2021-08-12 MED ORDER — SUGAMMADEX SODIUM 200 MG/2ML IV SOLN
INTRAVENOUS | Status: DC | PRN
  Administered 2021-08-12: 200 mg via INTRAVENOUS

## 2021-08-12 MED ORDER — AMISULPRIDE (ANTIEMETIC) 5 MG/2ML IV SOLN: 10.0000 mg | Freq: Once | INTRAVENOUS | Status: DC | PRN

## 2021-08-12 MED ORDER — LIDOCAINE 2% (20 MG/ML) 5 ML SYRINGE
INTRAMUSCULAR | Status: DC | PRN
  Administered 2021-08-12: 40 mg via INTRAVENOUS

## 2021-08-12 MED ORDER — OXYCODONE HCL 5 MG PO TABS
5.0000 mg | ORAL_TABLET | ORAL | Status: DC | PRN
  Administered 2021-08-12 – 2021-08-15 (×11): 10 mg via ORAL
  Filled 2021-08-12 (×13): qty 2

## 2021-08-12 MED ORDER — SODIUM CHLORIDE 0.9 % IR SOLN
Status: DC | PRN
  Administered 2021-08-12 (×2): 1000 mL

## 2021-08-12 MED ORDER — SUCCINYLCHOLINE CHLORIDE 200 MG/10ML IV SOSY
PREFILLED_SYRINGE | INTRAVENOUS | Status: DC | PRN
  Administered 2021-08-12: 120 mg via INTRAVENOUS

## 2021-08-12 MED ORDER — ONDANSETRON HCL 4 MG/2ML IJ SOLN: 4.0000 mg | Freq: Once | INTRAMUSCULAR | Status: DC | PRN

## 2021-08-12 MED ORDER — SODIUM CHLORIDE 0.9 % IV SOLN
INTRAVENOUS | Status: DC | PRN
  Administered 2021-08-12: 500 mL via INTRAVENOUS

## 2021-08-12 MED ORDER — SODIUM CHLORIDE 0.9 % IV SOLN: INTRAVENOUS | Status: DC

## 2021-08-12 MED ORDER — METHOCARBAMOL 1000 MG/10ML IJ SOLN
1000.0000 mg | Freq: Three times a day (TID) | INTRAVENOUS | Status: DC
  Administered 2021-08-12 (×2): 1000 mg via INTRAVENOUS
  Filled 2021-08-12 (×6): qty 10

## 2021-08-12 MED ORDER — DEXAMETHASONE SODIUM PHOSPHATE 10 MG/ML IJ SOLN
INTRAMUSCULAR | Status: DC | PRN
  Administered 2021-08-12: 4 mg via INTRAVENOUS

## 2021-08-12 MED ORDER — METRONIDAZOLE 500 MG/100ML IV SOLN
500.0000 mg | Freq: Once | INTRAVENOUS | Status: AC
  Administered 2021-08-12: 500 mg via INTRAVENOUS
  Filled 2021-08-12: qty 100

## 2021-08-12 MED ORDER — LACTATED RINGERS IV SOLN: INTRAVENOUS | Status: DC

## 2021-08-12 MED ORDER — METOPROLOL TARTRATE 5 MG/5ML IV SOLN
5.0000 mg | Freq: Four times a day (QID) | INTRAVENOUS | Status: DC | PRN
Start: 2021-08-12 — End: 2021-08-15

## 2021-08-12 MED ORDER — ACETAMINOPHEN 10 MG/ML IV SOLN
1000.0000 mg | Freq: Four times a day (QID) | INTRAVENOUS | Status: AC
  Administered 2021-08-12 – 2021-08-13 (×3): 1000 mg via INTRAVENOUS
  Filled 2021-08-12 (×6): qty 100

## 2021-08-12 MED ORDER — LACTATED RINGERS IV SOLN: INTRAVENOUS | Status: DC | PRN

## 2021-08-12 MED ORDER — ENOXAPARIN SODIUM 40 MG/0.4ML IJ SOSY: 40.0000 mg | PREFILLED_SYRINGE | INTRAMUSCULAR | Status: DC

## 2021-08-12 MED ORDER — LIDOCAINE 2% (20 MG/ML) 5 ML SYRINGE
INTRAMUSCULAR | Status: AC
  Filled 2021-08-12: qty 5

## 2021-08-12 MED ORDER — DEXAMETHASONE SODIUM PHOSPHATE 10 MG/ML IJ SOLN
INTRAMUSCULAR | Status: AC
  Filled 2021-08-12: qty 1

## 2021-08-12 MED ORDER — 0.9 % SODIUM CHLORIDE (POUR BTL) OPTIME
TOPICAL | Status: DC | PRN
  Administered 2021-08-12: 1000 mL

## 2021-08-12 MED ORDER — ACETAMINOPHEN 650 MG RE SUPP: 650.0000 mg | Freq: Four times a day (QID) | RECTAL | Status: DC | PRN

## 2021-08-12 MED ORDER — FENTANYL CITRATE (PF) 100 MCG/2ML IJ SOLN
INTRAMUSCULAR | Status: AC
  Filled 2021-08-12: qty 2

## 2021-08-12 MED ORDER — ONDANSETRON HCL 4 MG/2ML IJ SOLN
4.0000 mg | Freq: Four times a day (QID) | INTRAMUSCULAR | Status: DC | PRN
  Administered 2021-08-12: 4 mg via INTRAVENOUS

## 2021-08-12 MED ORDER — SODIUM CHLORIDE 0.9 % IV SOLN
2.0000 g | Freq: Once | INTRAVENOUS | Status: AC
  Administered 2021-08-12: 2 g via INTRAVENOUS
  Filled 2021-08-12: qty 20

## 2021-08-12 MED ORDER — EPHEDRINE SULFATE-NACL 50-0.9 MG/10ML-% IV SOSY
PREFILLED_SYRINGE | INTRAVENOUS | Status: DC | PRN
Start: 2021-08-12 — End: 2021-08-12
  Administered 2021-08-12: 5 mg via INTRAVENOUS

## 2021-08-12 MED ORDER — FENTANYL CITRATE (PF) 100 MCG/2ML IJ SOLN
25.0000 ug | INTRAMUSCULAR | Status: DC | PRN
  Administered 2021-08-12 (×2): 50 ug via INTRAVENOUS

## 2021-08-12 MED ORDER — HYDROMORPHONE HCL 1 MG/ML IJ SOLN
0.5000 mg | INTRAMUSCULAR | Status: AC | PRN
  Administered 2021-08-12: 0.5 mg via INTRAVENOUS
  Filled 2021-08-12: qty 0.5

## 2021-08-12 MED ORDER — METHOCARBAMOL 500 MG PO TABS: 500.0000 mg | ORAL_TABLET | Freq: Three times a day (TID) | ORAL | Status: DC | PRN

## 2021-08-12 MED ORDER — OXYCODONE HCL 5 MG PO TABS: 5.0000 mg | ORAL_TABLET | ORAL | Status: DC | PRN

## 2021-08-12 MED ORDER — ROSUVASTATIN CALCIUM 20 MG PO TABS
20.0000 mg | ORAL_TABLET | Freq: Every day | ORAL | Status: DC
  Administered 2021-08-12 – 2021-08-14 (×3): 20 mg via ORAL
  Filled 2021-08-12 (×3): qty 1

## 2021-08-12 MED ORDER — BUPIVACAINE HCL 0.25 % IJ SOLN
INTRAMUSCULAR | Status: DC | PRN
  Administered 2021-08-12: 15 mL

## 2021-08-12 MED ORDER — ROCURONIUM BROMIDE 10 MG/ML (PF) SYRINGE
PREFILLED_SYRINGE | INTRAVENOUS | Status: AC
  Filled 2021-08-12: qty 10

## 2021-08-12 MED ORDER — HYDROMORPHONE HCL 1 MG/ML IJ SOLN
0.5000 mg | INTRAMUSCULAR | Status: DC | PRN
  Administered 2021-08-12: 0.5 mg via INTRAVENOUS
  Filled 2021-08-12: qty 1

## 2021-08-12 MED ORDER — BUPIVACAINE HCL (PF) 0.25 % IJ SOLN
INTRAMUSCULAR | Status: AC
  Filled 2021-08-12: qty 30

## 2021-08-12 MED ORDER — ONDANSETRON 4 MG PO TBDP
4.0000 mg | ORAL_TABLET | Freq: Four times a day (QID) | ORAL | Status: DC | PRN
  Administered 2021-08-12: 4 mg via ORAL
  Filled 2021-08-12: qty 1

## 2021-08-12 MED ORDER — HEMOSTATIC AGENTS (NO CHARGE) OPTIME
TOPICAL | Status: DC | PRN
  Administered 2021-08-12: 1 via TOPICAL

## 2021-08-12 SURGICAL SUPPLY — 43 items
APPLIER CLIP 5 13 M/L LIGAMAX5 (MISCELLANEOUS) ×2
BLADE CLIPPER SURG (BLADE) IMPLANT
CANISTER SUCT 3000ML PPV (MISCELLANEOUS) ×2 IMPLANT
CHLORAPREP W/TINT 26 (MISCELLANEOUS) ×2 IMPLANT
CLIP APPLIE 5 13 M/L LIGAMAX5 (MISCELLANEOUS) ×1 IMPLANT
CNTNR URN SCR LID CUP LEK RST (MISCELLANEOUS) ×1 IMPLANT
CONT SPEC 4OZ STRL OR WHT (MISCELLANEOUS) ×2
COVER SURGICAL LIGHT HANDLE (MISCELLANEOUS) ×2 IMPLANT
DERMABOND ADVANCED (GAUZE/BANDAGES/DRESSINGS) ×1
DERMABOND ADVANCED .7 DNX12 (GAUZE/BANDAGES/DRESSINGS) ×1 IMPLANT
DISSECTOR BLUNT TIP ENDO 5MM (MISCELLANEOUS) IMPLANT
ELECT CAUTERY BLADE 6.4 (BLADE) ×2 IMPLANT
ELECT REM PT RETURN 9FT ADLT (ELECTROSURGICAL) ×2
ELECTRODE REM PT RTRN 9FT ADLT (ELECTROSURGICAL) ×1 IMPLANT
GLOVE SURG ENC MOIS LTX SZ6.5 (GLOVE) ×2 IMPLANT
GLOVE SURG UNDER POLY LF SZ6 (GLOVE) ×2 IMPLANT
GOWN STRL REUS W/ TWL LRG LVL3 (GOWN DISPOSABLE) ×3 IMPLANT
GOWN STRL REUS W/TWL LRG LVL3 (GOWN DISPOSABLE) ×6
HEMOSTAT SURGICEL 4X8 (HEMOSTASIS) ×2 IMPLANT
IRRIG SUCT STRYKERFLOW 2 WTIP (MISCELLANEOUS) ×2
IRRIGATION SUCT STRKRFLW 2 WTP (MISCELLANEOUS) ×1 IMPLANT
KIT BASIN OR (CUSTOM PROCEDURE TRAY) ×2 IMPLANT
KIT TURNOVER KIT B (KITS) ×2 IMPLANT
NS IRRIG 1000ML POUR BTL (IV SOLUTION) ×2 IMPLANT
PAD ARMBOARD 7.5X6 YLW CONV (MISCELLANEOUS) ×2 IMPLANT
PENCIL BUTTON HOLSTER BLD 10FT (ELECTRODE) ×2 IMPLANT
POUCH RETRIEVAL ECOSAC 10 (ENDOMECHANICALS) ×1 IMPLANT
POUCH RETRIEVAL ECOSAC 10MM (ENDOMECHANICALS) ×2
POUCH SPECIMEN RETRIEVAL 10MM (ENDOMECHANICALS) ×2 IMPLANT
SCISSORS LAP 5X35 DISP (ENDOMECHANICALS) ×2 IMPLANT
SET IRRIG TUBING LAPAROSCOPIC (IRRIGATION / IRRIGATOR) IMPLANT
SET TUBE SMOKE EVAC HIGH FLOW (TUBING) ×2 IMPLANT
SLEEVE ENDOPATH XCEL 5M (ENDOMECHANICALS) ×4 IMPLANT
SUT MNCRL AB 4-0 PS2 18 (SUTURE) ×2 IMPLANT
SUT VIC AB 0 CT1 27 (SUTURE)
SUT VIC AB 0 CT1 27XBRD ANBCTR (SUTURE) IMPLANT
SUT VIC AB 0 UR5 27 (SUTURE) ×2 IMPLANT
SUT VICRYL 0 AB UR-6 (SUTURE) IMPLANT
TOWEL GREEN STERILE FF (TOWEL DISPOSABLE) ×2 IMPLANT
TRAY LAPAROSCOPIC MC (CUSTOM PROCEDURE TRAY) ×2 IMPLANT
TROCAR XCEL BLUNT TIP 100MML (ENDOMECHANICALS) ×2 IMPLANT
TROCAR XCEL NON-BLD 5MMX100MML (ENDOMECHANICALS) ×2 IMPLANT
WATER STERILE IRR 1000ML POUR (IV SOLUTION) ×2 IMPLANT

## 2021-08-12 NOTE — ED Notes (Signed)
DIEA ROSS (DAUGHTER) WOULD LIKE AN UPDATE [phone]

## 2021-08-12 NOTE — ED Notes (Signed)
Waiting for pharmacy to tube Tylenol IV

## 2021-08-12 NOTE — Anesthesia Preprocedure Evaluation (Addendum)
Anesthesia Evaluation  Patient identified by MRN, date of birth, ID band Patient awake    Reviewed: Allergy & Precautions, NPO status , Patient's Chart, lab work & pertinent test results  History of Anesthesia Complications (+) history of anesthetic complications (issues w/ nerve block in past)  Airway Mallampati: II  TM Distance: >3 FB Neck ROM: Full    Dental no notable dental hx. (+) Edentulous Lower, Edentulous Upper   Pulmonary sleep apnea (does not use CPAP) , Patient abstained from smoking., former smoker,  Quit smoking 2022   Pulmonary exam normal breath sounds clear to auscultation       Cardiovascular hypertension, Pt. on medications +CHF (grade 1 diastolic dysfunction)  Normal cardiovascular exam Rhythm:Regular Rate:Normal  Echo 2018: - Left ventricle: The cavity size was normal. There was mild  concentric hypertrophy. Systolic function was normal. The  estimated ejection fraction was in the range of 60% to 65%. Wall  motion was normal; there were no regional wall motion  abnormalities. Doppler parameters are consistent with abnormal  left ventricular relaxation (grade 1 diastolic dysfunction).  - Aortic valve: Trileaflet; normal thickness, mildly calcified  leaflets. Valve area (VTI): 1.46 cm^2. Valve area (Vmax): 1.55  cm^2. Valve area (Vmean): 1.45 cm^2.  - Mitral valve: Mildly calcified annulus. Mildly calcified leaflets  .  - Left atrium: The atrium was moderately dilated.  - Right atrium: The atrium was mildly dilated.    Neuro/Psych CVA (L sided weakness, uses walker to ambulate), Residual Symptoms negative psych ROS   GI/Hepatic Neg liver ROS, GERD  Controlled,acute cholecystitis- has been having nausea, no vomiting   Endo/Other  Morbid obesityBMI 42  Renal/GU negative Renal ROS  negative genitourinary   Musculoskeletal  (+) Arthritis , Osteoarthritis,    Abdominal   Peds   Hematology  (+) Blood dyscrasia, anemia , hct 35.2    Anesthesia Other Findings   Reproductive/Obstetrics negative OB ROS                            Anesthesia Physical Anesthesia Plan  ASA: 3  Anesthesia Plan: General   Post-op Pain Management:    Induction: Intravenous  PONV Risk Score and Plan: 4 or greater and Ondansetron, Dexamethasone and Treatment may vary due to age or medical condition  Airway Management Planned: Oral ETT  Additional Equipment: None  Intra-op Plan:   Post-operative Plan: Extubation in OR  Informed Consent: I have reviewed the patients History and Physical, chart, labs and discussed the procedure including the risks, benefits and alternatives for the proposed anesthesia with the patient or authorized representative who has indicated his/her understanding and acceptance.     Dental advisory given  Plan Discussed with: CRNA  Anesthesia Plan Comments:        Anesthesia Quick Evaluation

## 2021-08-12 NOTE — H&P (Signed)
ERNESTYNE CALDWELL 1946/02/03  [id number].    Requesting MD: Quincy Carnes, PA-C Chief Complaint/Reason for Consult: cholecystitis  HPI:  Ellen Henry is a 75 yo female who presented to the ED with epigastric abdominal pain. Her pain began yesterday after eating, and is localized to the epigastric area and RUQ.  It has gotten increasingly severe.  She has not had any fevers.  Labs in the ED were unremarkable, with normal WBC.  Her alk phos is mildly elevated at 142 but LFTs are otherwise normal.  A CT abdomen pelvis was concerning for early acute cholecystitis.  RUQ US showed gallbladder wall thickening and pericholecystic fluid, consistent with acute cholecystitis.  Common bile duct measured 13 mm.  General surgery was consulted.  Patient's past medical history includes a stroke in 2018, and she still has residual left-sided weakness.  She lives independently.  Prior abdominal surgeries include a hysterectomy, appendectomy, and spine surgery via an anterior approach.  ROS: Review of Systems  Constitutional:  Negative for chills and fever.  Respiratory:  Negative for shortness of breath.   Gastrointestinal:  Positive for abdominal pain and nausea. Negative for vomiting.  Neurological:        H/o stroke with left sided weakness   Family History  Problem Relation Age of Onset   Varicose Veins Mother     Past Medical History:  Diagnosis Date   Acute kidney failure due to procedure    Arthritis    Complication of anesthesia    after nerve block difficult breathing (at [place] [address] transported to [facility])   Dry skin    GERD (gastroesophageal reflux disease)    "years ago", diet controlled   Gout    H/O pyelonephritis    as a child   Heart murmur    since birth, denies any problems    History of sepsis    after spinal surgery   Hypertension    Impingement syndrome of right shoulder    Neuropathy    Osteoarthritis of AC (acromioclavicular) joint    Right    Pneumonia    Radiculopathy    Rotator cuff tear, right    Sleep apnea    does not use cpap   Stroke ([place]) 12/15/2016   left side weakness    Past Surgical History:  Procedure Laterality Date   ABDOMINAL EXPOSURE N/A 08/29/2014   Procedure: ABDOMINAL EXPOSURE;  Surgeon: Rosetta Posner, MD;  Location: [place];  Service: Vascular;  Laterality: N/A;   ABDOMINAL HYSTERECTOMY     ANTERIOR LUMBAR FUSION N/A 08/29/2014   Procedure: ANTERIOR LUMBAR FUSION 1 LEVEL;  Surgeon: Sinclair Ship, MD;  Location: [place];  Service: Orthopedics;  Laterality: N/A;  Lumbar 4-5 anterior lumbar interbody fusion with allograft and instrumentation.   APPENDECTOMY     BACK SURGERY  08/28/2014 and 08/29/2014   lumbar fusion   CARPAL TUNNEL RELEASE Right    release done twice   COLONOSCOPY     JOINT REPLACEMENT     left knee replacement     lower intestine removal     REPLACEMENT TOTAL KNEE  2010   rigth knee   SHOULDER ARTHROSCOPY Right 01/18/2018   Procedure: RIGHT SHOULDER ARTHROSCOPY, SUBACROMIAL DECOMPRESSION, DISTAL CLAVICLE RESECTION WITH MINI OPEN ROTATOR CUFF REPAIR;  Surgeon: Garald Balding, MD;  Location: [place];  Service: Orthopedics;  Laterality: Right;   TONSILLECTOMY     TOTAL HIP ARTHROPLASTY Right 06/11/2021   Procedure: TOTAL HIP  ARTHROPLASTY ANTERIOR APPROACH;  Surgeon: [name], MD;  Location: [facility];  Service: Orthopedics;  Laterality: Right;   TOTAL KNEE ARTHROPLASTY Left 02/15/2015   Procedure: TOTAL KNEE ARTHROPLASTY;  Surgeon: [name], MD;  Location: [place];  Service: Orthopedics;  Laterality: Left;    Social History:  reports that she quit smoking about 4 months ago. Her smoking use included cigarettes. She smoked an average of .5 packs per day. She has never used smokeless tobacco. She reports that she does not drink alcohol and does not use drugs.  Allergies:  Allergies  Allergen Reactions   Food Allergy Formula Other (See Comments)    Shellfish-banana-beef-flares up  her gout    (Not in a hospital admission)    Physical Exam: Blood pressure (!) 169/90, pulse (!) 57, temperature 98.4 F (36.9 C), temperature source Oral, resp. rate 18, height 5' (1.524 m), weight 97.1 kg, SpO2 97 %. General: resting comfortably, appears stated age, no apparent distress Neurological: alert and oriented, no facial asymmetry HEENT: normocephalic, atraumatic, oropharynx clear, no scleral icterus CV: regular rate and rhythm, extremities warm and well-perfused Respiratory: normal work of breathing on room air, lungs clear to auscultation bilaterally, symmetric chest wall expansion Abdomen: soft, nondistended, tender to palpation in the epigastric area and RUQ. No masses or organomegaly. Well-healed lower midline and LLQ surgical scars. Extremities: warm and well-perfused, no deformities, moving all extremities spontaneously Psychiatric: normal mood and affect Skin: warm and dry, no jaundice, no rashes or lesions   Results for orders placed or performed during the hospital encounter of 08/11/21 (from the past 48 hour(s))  Comprehensive metabolic panel     Status: Abnormal   Collection Time: 08/11/21  9:10 PM  Result Value Ref Range   Sodium 141 135 - 145 mmol/L   Potassium 3.3 (L) 3.5 - 5.1 mmol/L   Chloride 104 98 - 111 mmol/L   CO2 27 22 - 32 mmol/L   Glucose, Bld 101 (H) 70 - 99 mg/dL    Comment: Glucose reference range applies only to samples taken after fasting for at least 8 hours.   BUN 9 8 - 23 mg/dL   Creatinine, Ser 0.73 0.44 - 1.00 mg/dL   Calcium 9.7 8.9 - 10.3 mg/dL   Total Protein 8.1 6.5 - 8.1 g/dL   Albumin 3.7 3.5 - 5.0 g/dL   AST 32 15 - 41 U/L   ALT 21 0 - 44 U/L   Alkaline Phosphatase 142 (H) 38 - 126 U/L   Total Bilirubin 0.4 0.3 - 1.2 mg/dL   GFR, Estimated >60 >60 mL/min    Comment: (NOTE) Calculated using the CKD-EPI Creatinine Equation (2021)    Anion gap 10 5 - 15    Comment: Performed at [facility] [address]  [place], [place] [address]  CBC with Differential     Status: Abnormal   Collection Time: 08/11/21  9:10 PM  Result Value Ref Range   WBC 8.6 4.0 - 10.5 K/uL   RBC 3.98 3.87 - 5.11 MIL/uL   Hemoglobin 11.4 (L) 12.0 - 15.0 g/dL   HCT 35.2 (L) 36.0 - 46.0 %   MCV 88.4 80.0 - 100.0 fL   MCH 28.6 26.0 - 34.0 pg   MCHC 32.4 30.0 - 36.0 g/dL   RDW 13.9 11.5 - 15.5 %   Platelets 301 150 - 400 K/uL   nRBC 0.0 0.0 - 0.2 %   Neutrophils Relative % 69 %   Neutro Abs 6.1  1.7 - 7.7 K/uL   Lymphocytes Relative 22 %   Lymphs Abs 1.9 0.7 - 4.0 K/uL   Monocytes Relative 7 %   Monocytes Absolute 0.6 0.1 - 1.0 K/uL   Eosinophils Relative 1 %   Eosinophils Absolute 0.0 0.0 - 0.5 K/uL   Basophils Relative 0 %   Basophils Absolute 0.0 0.0 - 0.1 K/uL   Immature Granulocytes 1 %   Abs Immature Granulocytes 0.04 0.00 - 0.07 K/uL    Comment: Performed at [facility] [address], [place], [place] [address]  Lipase, blood     Status: None   Collection Time: 08/11/21  9:10 PM  Result Value Ref Range   Lipase 36 11 - 51 U/L    Comment: Performed at [facility] [address], [place], [place] [address]  Troponin I (High Sensitivity)     Status: None   Collection Time: 08/11/21  9:10 PM  Result Value Ref Range   Troponin I (High Sensitivity) 5 <18 ng/L    Comment: (NOTE) Elevated high sensitivity troponin I (hsTnI) values and significant  changes across serial measurements may suggest ACS but many other  chronic and acute conditions are known to elevate hsTnI results.  Refer to the "Links" section for chest pain algorithms and additional  guidance. Performed at [facility], [place] [address] [place], [place] [address]   Urinalysis, Routine w reflex microscopic Urine, Clean Catch     Status: None   Collection Time: 08/11/21 10:04 PM  Result Value Ref Range   Color, Urine YELLOW YELLOW   APPearance CLEAR CLEAR   Specific Gravity, Urine 1.012 1.005 - 1.030   pH 7.0 5.0 - 8.0    Glucose, UA NEGATIVE NEGATIVE mg/dL   Hgb urine dipstick NEGATIVE NEGATIVE   Bilirubin Urine NEGATIVE NEGATIVE   Ketones, ur NEGATIVE NEGATIVE mg/dL   Protein, ur NEGATIVE NEGATIVE mg/dL   Nitrite NEGATIVE NEGATIVE   Leukocytes,Ua NEGATIVE NEGATIVE    Comment: Performed at [facility] [address] [place], [place] [address]  Resp Panel by RT-PCR (Flu A&B, Covid) Nasopharyngeal Swab     Status: None   Collection Time: 08/12/21  4:15 AM   Specimen: Nasopharyngeal Swab; Nasopharyngeal(NP) swabs in vial transport medium  Result Value Ref Range   SARS Coronavirus 2 by RT PCR NEGATIVE NEGATIVE    Comment: (NOTE) SARS-CoV-2 target nucleic acids are NOT DETECTED.  The SARS-CoV-2 RNA is generally detectable in upper respiratory specimens during the acute phase of infection. The lowest concentration of SARS-CoV-2 viral copies this assay can detect is 138 copies/mL. A negative result does not preclude SARS-Cov-2 infection and should not be used as the sole basis for treatment or other patient management decisions. A negative result may occur with  improper specimen collection/handling, submission of specimen other than nasopharyngeal swab, presence of viral mutation(s) within the areas targeted by this assay, and inadequate number of viral copies(<138 copies/mL). A negative result must be combined with clinical observations, patient history, and epidemiological information. The expected result is Negative.  Fact Sheet for Patients:  EntrepreneurPulse.com.au  Fact Sheet for Healthcare Providers:  IncredibleEmployment.be  This test is no t yet approved or cleared by the [place] FDA and  has been authorized for detection and/or diagnosis of SARS-CoV-2 by FDA under an Emergency Use Authorization (EUA). This EUA will remain  in effect (meaning this test can be used) for the duration of the COVID-19 declaration under Section 564(b)(1)  of the  Act, 21 U.S.C.section 360bbb-3(b)(1), unless the authorization is terminated  or revoked sooner.       Influenza A by PCR NEGATIVE NEGATIVE   Influenza B by PCR NEGATIVE NEGATIVE    Comment: (NOTE) The Xpert Xpress SARS-CoV-2/FLU/RSV plus assay is intended as an aid in the diagnosis of influenza from Nasopharyngeal swab specimens and should not be used as a sole basis for treatment. Nasal washings and aspirates are unacceptable for Xpert Xpress SARS-CoV-2/FLU/RSV testing.  Fact Sheet for Patients: EntrepreneurPulse.com.au  Fact Sheet for Healthcare Providers: IncredibleEmployment.be  This test is not yet approved or cleared by the [place] FDA and has been authorized for detection and/or diagnosis of SARS-CoV-2 by FDA under an Emergency Use Authorization (EUA). This EUA will remain in effect (meaning this test can be used) for the duration of the COVID-19 declaration under Section 564(b)(1) of the Act, 21 U.S.C. section 360bbb-3(b)(1), unless the authorization is terminated or revoked.  Performed at [facility], [place] [address] [place], [place] [address]    CT ABDOMEN PELVIS W CONTRAST  Result Date: 08/12/2021 CLINICAL DATA:  Epigastric pain epigastric pain, nausea EXAM: CT ABDOMEN AND PELVIS WITH CONTRAST TECHNIQUE: Multidetector CT imaging of the abdomen and pelvis was performed using the standard protocol following bolus administration of intravenous contrast. CONTRAST:  64m OMNIPAQUE IOHEXOL 350 MG/ML SOLN COMPARISON:  None. FINDINGS: Lower chest: No acute abnormality. Hepatobiliary: Cholelithiasis is noted. The gallbladder is distended and there is trace pericholecystic fluid within the gallbladder fossa and subtle pericholecystic inflammatory stranding best seen on coronal imaging. Together, the findings are suspicious for changes of acute cholecystitis. The liver is unremarkable. No intra or extrahepatic biliary ductal dilation.  Pancreas: Unremarkable Spleen: Unremarkable Adrenals/Urinary Tract: The adrenal glands are unremarkable. The kidneys are normal. Streak artifact partially obscures the bladder, however the visualized portion is unremarkable. Stomach/Bowel: The stomach, small bowel, and large bowel are unremarkable. The appendix is absent. No free intraperitoneal gas or fluid. Vascular/Lymphatic: Moderate aortoiliac atherosclerotic calcification. No aortic aneurysm. Circumaortic left renal vein. No pathologic adenopathy within the abdomen and pelvis. Reproductive: Status post hysterectomy. No adnexal masses. Other: Tiny fat containing umbilical hernia. Musculoskeletal: Right total hip arthroplasty and L4-5 lumbar fusion with instrumentation has been performed. Right L4 hemilaminectomy and resection of the spinous process noted. No acute bone abnormality. No lytic or blastic bone lesion. IMPRESSION: Cholelithiasis with findings suggesting early acute cholecystitis. Correlation with liver enzymes is recommended. Right upper quadrant sonography may be helpful for confirmation. Aortic Atherosclerosis (ICD10-I70.0). Electronically Signed   By: AFidela SalisburyM.D.   On: 08/12/2021 01:06   UKoreaAbdomen Limited RUQ (LIVER/GB)  Result Date: 08/12/2021 CLINICAL DATA:  Right upper quadrant pain EXAM: ULTRASOUND ABDOMEN LIMITED RIGHT UPPER QUADRANT COMPARISON:  CT 08/12/2021 FINDINGS: Gallbladder: Multiple gallstones layering within the gallbladder measuring up to 1.6 cm. Gallbladder wall is thickened measuring 5 mm. Small amount of pericholecystic fluid. Patient was tender over the gallbladder during the study. Common bile duct: Diameter: Dilated measuring 13 mm. Distal duct is obscured by overlying bowel gas. No visible ductal stones in the visualized duct. Liver: No focal lesion identified. Within normal limits in parenchymal echogenicity. Portal vein is patent on color Doppler imaging with normal direction of blood flow towards the liver.  Other: None. IMPRESSION: Cholelithiasis. Gallbladder wall thickening, pericholecystic fluid and tenderness over the gallbladder. The study concerning for active diverticulitis. Common bile duct dilatation. No visible ductal stones although the distal duct is obscured by overlying bowel  gas. Electronically Signed   By: Rolm Baptise M.D.   On: 08/12/2021 02:05      Assessment/Plan This is a 75 year old female presenting with acute epigastric and RUQ pain.  I reviewed her CT scan and ultrasound, both of which show pericholecystic fluid and gallbladder wall thickening consistent with cholecystitis.  Her mildly elevated alk phos and mild CBD dilation also suggest that she may have recently passed the stone via the common duct.  I recommended laparoscopic cholecystectomy, and discussed the details of this procedure with the patient.  She agrees to proceed. - NPO, IV fluid hydration - Rocephin started in the ED - Pain and nausea control - Home medications as appropriate - VTE: lovenox, SCDs - Dispo: admit to observation, plan for cholecystectomy today with possible intraoperative cholangiogram   Michaelle Birks, MD [facility] General, Hepatobiliary and Pancreatic Surgery 08/12/21 6:38 AM

## 2021-08-12 NOTE — ED Notes (Signed)
Explained to pt and family we were waiting on IV Tylenol

## 2021-08-12 NOTE — ED Notes (Signed)
Daughter Oran Rein [phone] would like an update

## 2021-08-12 NOTE — Op Note (Signed)
   Operative Note  Date: 08/12/2021  Procedure: laparoscopic cholecystectomy  Pre-op diagnosis: acute cholecystitis Post-op diagnosis: same  Indication and clinical history: The patient is a 75 y.o. year old female with acute cholecystitis  Surgeon: Jesusita Oka, MD  Anesthesiologist: Doroteo Glassman, DO Anesthesia: General  Findings:  Specimen: gallbladder EBL: 25cc Drains/Implants: none  Disposition: PACU - hemodynamically stable.  Description of procedure: The patient was positioned supine on the operating room table. Time-out was performed verifying correct patient, procedure, signature of informed consent, and administration of pre-operative antibiotics. General anesthetic induction and intubation were uneventful. The abdomen was prepped and draped in the usual sterile fashion. A supra-umbilical incision was made using an open technique using zero vicryl stay sutures on either side of the fascia and a 36m Hassan port inserted. After establishing pneumoperitoneum, which the patient tolerated well, the abdominal cavity was inspected and no injury of any intra-abdominal structures was identified. Additional ports were placed under direct visualization and using local anesthetic: two 593mports in the right subcostal region and a 11m33mort in the epigastric region. The patient was re-positioned to reverse Trendelenburg and right side up. Adhesiolysis was performed to expose the gallbladder, which was then retracted cephalad. The infundibulum was identified and retracted toward the right lower quadrant. The peritoneum was incised over the infundibulum and the triangle of Calot dissected to expose the critical view of safety. With clear identification and isolation of the cystic duct and cystic artery, the cystic artery was doubly clipped and divided. After this, the cystic duct was identified as a single structure entering the gallbladder, and was also doubly clipped and divided. The gallbladder  was dissected off the liver bed using electrocautery and hemostasis of the liver bed was confirmed prior to separation of the final peritoneal attachments of the gallbladder to the liver bed. The gallbladder fossa was irrigated until fluid returned clear. After transection of the final peritoneal attachments, the gallbladder was placed in an endoscopic specimen retrieval bag, removed via the umbilical port site, and sent to pathology as a permanent specimen. The gallbladder fossa was inspected and hemostasis achieved using electrocautery. There was no bile leakage from the cystic duct stump and correct placement of clips on the cystic artery and cystic duct stumps was confirmed. A piece of surgical was placed in the gallbladder fossa. The abdomen was desufflated and the fascia of the umbilical port site was closed using the previously placed stay sutures. Additional local anesthetic was administered at the umbilical port site.  The skin of all incisions was closed with 4-0 monocryl. Sterile dressings were applied. All sponge and instrument counts were correct at the conclusion of the procedure. The patient was awakened from anesthesia, extubated uneventfully, and transported to the PACU - hemodynamically stable.. There were no complications.   Upon entering the abdomen (organ space), I encountered infection of the gallbladder .  CASE DATA:  Type of patient[facility]  Status of Case? URGENT Add On  Infection Present At Time Of Surgery (PATOS)?  INFECTION of the gallbladder    AyeJesusita OkaD General and [facility]

## 2021-08-12 NOTE — Transfer of Care (Signed)
Immediate Anesthesia Transfer of Care Note  Patient: Ellen Henry  Procedure(s) Performed: LAPAROSCOPIC CHOLECYSTECTOMY  Patient Location: PACU  Anesthesia Type:General  Level of Consciousness: drowsy and patient cooperative  Airway & Oxygen Therapy: Patient Spontanous Breathing and Patient connected to nasal cannula oxygen  Post-op Assessment: Report given to RN and Post -op Vital signs reviewed and stable  Post vital signs: Reviewed and stable  Last Vitals:  Vitals Value Taken Time  BP 151/89 08/12/21 1633  Temp    Pulse 69 08/12/21 1633  Resp 16 08/12/21 1633  SpO2 96 % 08/12/21 1633  Vitals shown include unvalidated device data.  Last Pain:  Vitals:   08/12/21 1417  TempSrc: Oral  PainSc: 7       Patients Stated Pain Goal: 2 ([id number])  Complications: No notable events documented.

## 2021-08-12 NOTE — ED Notes (Signed)
Patient transported to CT 

## 2021-08-12 NOTE — Anesthesia Procedure Notes (Signed)
Procedure Name: Intubation Date/Time: 08/12/2021 2:50 PM Performed by: Oletta Lamas, CRNA Pre-anesthesia Checklist: Patient identified, Emergency Drugs available, Suction available and Patient being monitored Patient Re-evaluated:Patient Re-evaluated prior to induction Oxygen Delivery Method: Circle System Utilized Preoxygenation: Pre-oxygenation with 100% oxygen Induction Type: IV induction and Rapid sequence Laryngoscope Size: Mac and 4 Grade View: Grade I Tube type: Oral Number of attempts: 1 Airway Equipment and Method: Stylet Placement Confirmation: ETT inserted through vocal cords under direct vision, positive ETCO2 and breath sounds checked- equal and bilateral Secured at: 22 cm Tube secured with: Tape Dental Injury: Teeth and Oropharynx as per pre-operative assessment

## 2021-08-12 NOTE — Progress Notes (Signed)
Patient seen and examined. RUQ abd pain and imaging concern for acute cholecystitis. Recommend lap chole. Informed consent was obtained after detailed explanation of risks, including bleeding, infection, biloma, hematoma, injury to common bile duct, need for IOC to delineate anatomy, and need for conversion to open procedure. All questions answered to the patient's satisfaction.  Jesusita Oka, MD General and [facility]

## 2021-08-12 NOTE — ED Notes (Signed)
Patient transported to Ultrasound 

## 2021-08-12 NOTE — Anesthesia Postprocedure Evaluation (Signed)
Anesthesia Post Note  Patient: Ellen Henry  Procedure(s) Performed: LAPAROSCOPIC CHOLECYSTECTOMY     Patient location during evaluation: PACU Anesthesia Type: General Level of consciousness: sedated and patient cooperative Pain management: pain level controlled Vital Signs Assessment: post-procedure vital signs reviewed and stable Respiratory status: spontaneous breathing Cardiovascular status: stable Anesthetic complications: no   No notable events documented.  Last Vitals:  Vitals:   08/12/21 1700 08/12/21 1715  BP: 130/79 (!) 142/86  Pulse: 73 60  Resp: 16 12  Temp:  36.4 C  SpO2: 94% 97%    Last Pain:  Vitals:   08/12/21 1715  TempSrc:   PainSc: [name]

## 2021-08-12 NOTE — ED Notes (Signed)
Pt assisted to bedside commode

## 2021-08-13 ENCOUNTER — Encounter (HOSPITAL_COMMUNITY): Payer: Self-pay | Admitting: Surgery

## 2021-08-13 LAB — CBC
HCT: 32.9 % — ABNORMAL LOW (ref 36.0–46.0)
Hemoglobin: 11 g/dL — ABNORMAL LOW (ref 12.0–15.0)
MCH: 28.2 pg (ref 26.0–34.0)
MCHC: 33.4 g/dL (ref 30.0–36.0)
MCV: 84.4 fL (ref 80.0–100.0)
Platelets: 276 10*3/uL (ref 150–400)
RBC: 3.9 MIL/uL (ref 3.87–5.11)
RDW: 14 % (ref 11.5–15.5)
WBC: 9.7 10*3/uL (ref 4.0–10.5)
nRBC: 0 % (ref 0.0–0.2)

## 2021-08-13 LAB — COMPREHENSIVE METABOLIC PANEL
ALT: 38 U/L (ref 0–44)
AST: 82 U/L — ABNORMAL HIGH (ref 15–41)
Albumin: 2.9 g/dL — ABNORMAL LOW (ref 3.5–5.0)
Alkaline Phosphatase: 114 U/L (ref 38–126)
Anion gap: 9 (ref 5–15)
BUN: 7 mg/dL — ABNORMAL LOW (ref 8–23)
CO2: 27 mmol/L (ref 22–32)
Calcium: 9 mg/dL (ref 8.9–10.3)
Chloride: 101 mmol/L (ref 98–111)
Creatinine, Ser: 0.7 mg/dL (ref 0.44–1.00)
GFR, Estimated: >60 mL/min (ref >60)
Glucose, Bld: 119 mg/dL — ABNORMAL HIGH (ref 70–99)
Potassium: 3 mmol/L — ABNORMAL LOW (ref 3.5–5.1)
Sodium: 137 mmol/L (ref 135–145)
Total Bilirubin: 0.5 mg/dL (ref 0.3–1.2)
Total Protein: 6.7 g/dL (ref 6.5–8.1)

## 2021-08-13 LAB — URINE CULTURE

## 2021-08-13 MED ORDER — POTASSIUM CHLORIDE CRYS ER 20 MEQ PO TBCR
40.0000 meq | EXTENDED_RELEASE_TABLET | Freq: Two times a day (BID) | ORAL | Status: AC
  Administered 2021-08-13 (×2): 40 meq via ORAL
  Filled 2021-08-13 (×2): qty 2

## 2021-08-13 MED ORDER — METHOCARBAMOL 500 MG PO TABS
500.0000 mg | ORAL_TABLET | Freq: Four times a day (QID) | ORAL | Status: DC | PRN
  Administered 2021-08-13 (×2): 500 mg via ORAL
  Filled 2021-08-13 (×2): qty 1

## 2021-08-13 MED ORDER — DOCUSATE SODIUM 100 MG PO CAPS
100.0000 mg | ORAL_CAPSULE | Freq: Two times a day (BID) | ORAL | Status: DC
  Administered 2021-08-13 – 2021-08-15 (×5): 100 mg via ORAL
  Filled 2021-08-13 (×5): qty 1

## 2021-08-13 MED ORDER — ACETAMINOPHEN 500 MG PO TABS
1000.0000 mg | ORAL_TABLET | Freq: Three times a day (TID) | ORAL | Status: DC
  Administered 2021-08-13 – 2021-08-14 (×3): 1000 mg via ORAL
  Filled 2021-08-13 (×3): qty 2

## 2021-08-13 MED ORDER — ENOXAPARIN SODIUM 60 MG/0.6ML IJ SOSY
50.0000 mg | PREFILLED_SYRINGE | Freq: Every day | INTRAMUSCULAR | Status: DC
  Administered 2021-08-13 – 2021-08-15 (×3): 50 mg via SUBCUTANEOUS
  Filled 2021-08-13 (×3): qty 0.6

## 2021-08-13 MED ORDER — MORPHINE SULFATE (PF) 2 MG/ML IV SOLN
1.0000 mg | INTRAVENOUS | Status: DC | PRN
  Administered 2021-08-13 – 2021-08-14 (×4): 2 mg via INTRAVENOUS
  Filled 2021-08-13 (×4): qty 1

## 2021-08-13 NOTE — Discharge Instructions (Addendum)
[facility] ° °Please arrive at least 30 min before your appointment to complete your check in paperwork.  If you are unable to arrive 30 min prior to your appointment time we may have to cancel or reschedule you. °LAPAROSCOPIC SURGERY: POST OP INSTRUCTIONS °Always review your discharge instruction sheet given to you by the facility where your surgery was performed. °IF YOU HAVE DISABILITY OR FAMILY LEAVE FORMS, YOU MUST BRING THEM TO THE OFFICE FOR PROCESSING.   °DO NOT GIVE THEM TO YOUR DOCTOR. ° °PAIN CONTROL ° °First take acetaminophen (Tylenol) AND/or ibuprofen (Advil) to control your pain after surgery.  Follow directions on package.  Taking acetaminophen (Tylenol) and/or ibuprofen (Advil) regularly after surgery will help to control your pain and lower the amount of prescription pain medication you may need.  You should not take more than 4,000 mg (4 grams) of acetaminophen (Tylenol) in 24 hours.  You should not take ibuprofen (Advil), aleve, motrin, naprosyn or other NSAIDS if you have a history of stomach ulcers or chronic kidney disease.  °A prescription for pain medication may be given to you upon discharge.  Take your pain medication as prescribed, if you still have uncontrolled pain after taking acetaminophen (Tylenol) or ibuprofen (Advil). °Use ice packs to help control pain. °If you need a refill on your pain medication, please contact your pharmacy.  They will contact our office to request authorization. Prescriptions will not be filled after 5pm or on week-ends. ° °HOME MEDICATIONS °Take your usually prescribed medications unless otherwise directed. ° °DIET °You should follow a light diet the first few days after arrival home.  Be sure to include lots of fluids daily. Avoid fatty, fried foods.  ° °CONSTIPATION °It is common to experience some constipation after surgery and if you are taking pain medication.  Increasing fluid intake and taking a stool softener (such as Colace)  will usually help or prevent this problem from occurring.  A mild laxative (Milk of Magnesia or Miralax) should be taken according to package instructions if there are no bowel movements after 48 hours. ° °WOUND/INCISION CARE °Most patients will experience some swelling and bruising in the area of the incisions.  Ice packs will help.  Swelling and bruising can take several days to resolve.  °Unless discharge instructions indicate otherwise, follow guidelines below  °STERI-STRIPS - you may remove your outer bandages 48 hours after surgery, and you may shower at that time.  You have steri-strips (small skin tapes) in place directly over the incision.  These strips should be left on the skin for 7-10 days.   °DERMABOND/SKIN GLUE - you may shower in 24 hours.  The glue will flake off over the next 2-3 weeks. °Any sutures or staples will be removed at the office during your follow-up visit. ° °ACTIVITIES °You may resume regular (light) daily activities beginning the next day--such as daily self-care, walking, climbing stairs--gradually increasing activities as tolerated.  You may have sexual intercourse when it is comfortable.  Refrain from any heavy lifting or straining until approved by your doctor. °You may drive when you are no longer taking prescription pain medication, you can comfortably wear a seatbelt, and you can safely maneuver your car and apply brakes. ° °FOLLOW-UP °You should see your doctor in the office for a follow-up appointment approximately 2-3 weeks after your surgery.  You should have been given your post-op/follow-up appointment when your surgery was scheduled.  If you did not receive a post-op/follow-up appointment, make sure   that you call for this appointment within a day or two after you arrive home to insure a convenient appointment time. ° ° °WHEN TO CALL YOUR DOCTOR: °Fever over 101.0 °Inability to urinate °Continued bleeding from incision. °Increased pain, redness, or drainage from the  incision. °Increasing abdominal pain ° °The clinic staff is available to answer your questions during regular business hours.  Please don’t hesitate to call and ask to speak to one of the nurses for clinical concerns.  If you have a medical emergency, go to the nearest emergency room or call 911.  A surgeon from [facility] is always on call at the hospital. °[address], [address], [place], [place]  [address] ? [address] [address], [place], [place]   [address] °[phone] ? [phone] ? FAX [phone] ° ° ° ° °Managing Your Pain After Surgery Without Opioids ° ° ° °Thank you for participating in our program to help patients manage their pain after surgery without opioids. This is part of our effort to provide you with the best care possible, without exposing you or your family to the risk that opioids pose. ° °What pain can I expect after surgery? °You can expect to have some pain after surgery. This is normal. The pain is typically worse the day after surgery, and quickly begins to get better. °Many studies have found that many patients are able to manage their pain after surgery with Over-the-Counter (OTC) medications such as Tylenol and Motrin. If you have a condition that does not allow you to take Tylenol or Motrin, notify your surgical team. ° °How will I manage my pain? °The best strategy for controlling your pain after surgery is around the clock pain control with Tylenol (acetaminophen) and Motrin (ibuprofen or Advil). Alternating these medications with each other allows you to maximize your pain control. In addition to Tylenol and Motrin, you can use heating pads or ice packs on your incisions to help reduce your pain. ° °How will I alternate your regular strength over-the-counter pain medication? °You will take a dose of pain medication every three hours. °Start by taking 650 mg of Tylenol (2 pills of 325 mg) °3 hours later take 600 mg of Motrin (3 pills of 200 mg) °3 hours after  taking the Motrin take 650 mg of Tylenol °3 hours after that take 600 mg of Motrin. ° ° °- 1 - ° °See example - if your first dose of Tylenol is at 12:00 PM ° ° °12:00 PM Tylenol 650 mg (2 pills of 325 mg)  °3:00 PM Motrin 600 mg (3 pills of 200 mg)  °6:00 PM Tylenol 650 mg (2 pills of 325 mg)  °9:00 PM Motrin 600 mg (3 pills of 200 mg)  °Continue alternating every 3 hours  ° °We recommend that you follow this schedule around-the-clock for at least 3 days after surgery, or until you feel that it is no longer needed. Use the table on the last page of this handout to keep track of the medications you are taking. °Important: °Do not take more than 3000mg of Tylenol or 3200mg of Motrin in a 24-hour period. °Do not take ibuprofen/Motrin if you have a history of bleeding stomach ulcers, severe kidney disease, &/or actively taking a blood thinner ° °What if I still have pain? °If you have pain that is not controlled with the over-the-counter pain medications (Tylenol and Motrin or Advil) you might have what we call “breakthrough” pain. You will receive a prescription   for a small amount of an opioid pain medication such as Oxycodone, Tramadol, or Tylenol with Codeine. Use these opioid pills in the first 24 hours after surgery if you have breakthrough pain. Do not take more than 1 pill every 4-6 hours. ° °If you still have uncontrolled pain after using all opioid pills, don't hesitate to call our staff using the number provided. We will help make sure you are managing your pain in the best way possible, and if necessary, we can provide a prescription for additional pain medication. ° ° °Day 1   ° °Time  °Name of Medication Number of pills taken  °Amount of Acetaminophen  °Pain Level  ° °Comments  °AM PM       °AM PM       °AM PM       °AM PM       °AM PM       °AM PM       °AM PM       °AM PM       °Total Daily amount of Acetaminophen °Do not take more than  3,000 mg per day    ° ° °Day 2   ° °Time  °Name of Medication  Number of pills °taken  °Amount of Acetaminophen  °Pain Level  ° °Comments  °AM PM       °AM PM       °AM PM       °AM PM       °AM PM       °AM PM       °AM PM       °AM PM       °Total Daily amount of Acetaminophen °Do not take more than  3,000 mg per day    ° ° °Day 3   ° °Time  °Name of Medication Number of pills taken  °Amount of Acetaminophen  °Pain Level  ° °Comments  °AM PM       °AM PM       °AM PM       °AM PM       ° ° ° °AM PM       °AM PM       °AM PM       °AM PM       °Total Daily amount of Acetaminophen °Do not take more than  3,000 mg per day    ° ° °Day 4   ° °Time  °Name of Medication Number of pills taken  °Amount of Acetaminophen  °Pain Level  ° °Comments  °AM PM       °AM PM       °AM PM       °AM PM       °AM PM       °AM PM       °AM PM       °AM PM       °Total Daily amount of Acetaminophen °Do not take more than  3,000 mg per day    ° ° °Day 5   ° °Time  °Name of Medication Number °of pills taken  °Amount of Acetaminophen  °Pain Level  ° °Comments  °AM PM       °AM PM       °AM PM       °AM PM       °AM PM       °AM   PM       °AM PM       °AM PM       °Total Daily amount of Acetaminophen °Do not take more than  3,000 mg per day    ° ° ° °Day 6   ° °Time  °Name of Medication Number of pills °taken  °Amount of Acetaminophen  °Pain Level  °Comments  °AM PM       °AM PM       °AM PM       °AM PM       °AM PM       °AM PM       °AM PM       °AM PM       °Total Daily amount of Acetaminophen °Do not take more than  3,000 mg per day    ° ° °Day 7   ° °Time  °Name of Medication Number of pills taken  °Amount of Acetaminophen  °Pain Level  ° °Comments  °AM PM       °AM PM       °AM PM       °AM PM       °AM PM       °AM PM       °AM PM       °AM PM       °Total Daily amount of Acetaminophen °Do not take more than  3,000 mg per day    ° ° ° ° °For additional information about how and where to safely dispose of unused opioid °medications - https://www.morepowerfulnc.org ° °Disclaimer: This document  contains information and/or instructional materials adapted from [facility] for the typical patient with your condition. It does not replace medical advice from your health care provider because your experience may differ from that of the °typical patient. Talk to your health care provider if you have any questions about this °document, your condition or your treatment plan. °Adapted from [facility] ° °

## 2021-08-13 NOTE — Plan of Care (Signed)
VSS. C/o pain, given PRN meds. Heat applied to abd. Pain very hard to control today. MD aware about pain. Patient eating small amounts of food, but refusing to eat most d/t pain. OOB to [facility] and chair. Call bell within reach.   Problem: Clinical Measurements: Goal: Ability to maintain clinical measurements within normal limits will improve Outcome: Progressing Goal: Will remain free from infection Outcome: Progressing Goal: Diagnostic test results will improve Outcome: Progressing Goal: Respiratory complications will improve Outcome: Progressing Goal: Cardiovascular complication will be avoided Outcome: Progressing   Problem: Activity: Goal: Risk for activity intolerance will decrease Outcome: Progressing   Problem: Elimination: Goal: Will not experience complications related to bowel motility Outcome: Progressing Goal: Will not experience complications related to urinary retention Outcome: Progressing   Problem: Pain Managment: Goal: General experience of comfort will improve Outcome: Progressing   Problem: Safety: Goal: Ability to remain free from injury will improve Outcome: Progressing   Problem: Skin Integrity: Goal: Risk for impaired skin integrity will decrease Outcome: Progressing

## 2021-08-13 NOTE — Progress Notes (Signed)
ANTICOAGULATION CONSULT NOTE - Initial Consult  Pharmacy Consult for Lovenox Indication: VTE prophylaxis  Allergies  Allergen Reactions   Food Allergy Formula Other (See Comments)    Shellfish-banana-beef-flares up her gout    Patient Measurements: Height: 5' (152.4 cm) Weight: 97.1 kg (214 lb) IBW/kg (Calculated) : 45.5  Vital Signs: Temp: 97.3 F (36.3 C) (09/07 0700) Temp Source: Oral (09/07 0700) BP: 113/66 (09/07 0700) Pulse Rate: 77 (09/07 0700)  Labs: Recent Labs    08/11/21 2110 08/13/21 0036  HGB 11.4* 11.0*  HCT 35.2* 32.9*  PLT 301 276  CREATININE 0.73 0.70  TROPONINIHS 5  --     Estimated Creatinine Clearance: 62.4 mL/min (by C-G formula based on SCr of 0.7 mg/dL).   Medical History: Past Medical History:  Diagnosis Date   Acute kidney failure due to procedure    Arthritis    Complication of anesthesia    after nerve block difficult breathing (at [facility] [address] transported to [facility])   Dry skin    GERD (gastroesophageal reflux disease)    "years ago", diet controlled   Gout    H/O pyelonephritis    as a child   Heart murmur    since birth, denies any problems    History of sepsis    after spinal surgery   Hypertension    Impingement syndrome of right shoulder    Neuropathy    Osteoarthritis of AC (acromioclavicular) joint    Right   Pneumonia    Radiculopathy    Rotator cuff tear, right    Sleep apnea    does not use cpap   Stroke ([place]) 12/15/2016   left side weakness    Medications:  Medications Prior to Admission  Medication Sig Dispense Refill Last Dose   acetaminophen (TYLENOL) 500 MG tablet Take 500 mg by mouth every 6 (six) hours as needed for mild pain or headache.   08/11/2021   aspirin EC 81 MG tablet Take 81 mg by mouth daily. Swallow whole.   08/11/2021   diclofenac Sodium (VOLTAREN) 1 % GEL Apply 1 application topically 4 (four) times daily as needed (pain).   Past Month   docusate sodium (COLACE) 100 MG  capsule Take 100 mg by mouth 2 (two) times daily as needed for mild constipation.   08/11/2021   gabapentin (NEURONTIN) 300 MG capsule Take 300 mg by mouth in the morning and at bedtime.   Past Week   indapamide (LOZOL) 1.25 MG tablet Take 1.25 mg by mouth every morning.   08/11/2021   methocarbamol (ROBAXIN) 500 MG tablet Take 1 tablet (500 mg total) by mouth every 8 (eight) hours as needed for muscle spasms. 30 tablet 0 unk   rosuvastatin (CRESTOR) 20 MG tablet Take 20 mg by mouth at bedtime.   08/10/2021   telmisartan (MICARDIS) 40 MG tablet Take 40 mg by mouth every morning.   08/11/2021   traMADol (ULTRAM) 50 MG tablet Take 50 mg by mouth every 8 (eight) hours as needed for moderate pain.   08/11/2021   aspirin EC 325 MG EC tablet Take 1 tablet (325 mg total) by mouth 2 (two) times daily after a meal. (Patient not taking: No sig reported) 30 tablet 0 Not Taking   oxyCODONE (OXY IR/ROXICODONE) 5 MG immediate release tablet Take 1-2 tablets (5-10 mg total) by mouth every 6 (six) hours as needed for severe pain. (Patient not taking: No sig reported) 40 tablet 0 Not Taking    Assessment: 23  YOF with acute cholecystitis s/p laparoscopic cholecystectomy. POD 1. Pharmacy consulted to start Lovenox for VTE prophylaxis. Hgb mildly low. Plt wnl. SCr wnl  Goal of Therapy:  VTE ppx  Monitor platelets by anticoagulation protocol: Yes   Plan:  -Start Lovenox 50 mg (0.5 mg/kg) Q 24 hours.  -Pharmacy to sign off and monitor peripherally   Albertina Parr, PharmD., BCPS, BCCCP Clinical Pharmacist Please refer to [facility] for unit-specific pharmacist

## 2021-08-13 NOTE — Progress Notes (Signed)
PT Cancellation Note  Patient Details Name: Ellen Henry MRN: [id number] DOB: May 03, 1946   Cancelled Treatment:    Reason Eval/Treat Not Completed: Pain limiting ability to participate; patient back in bed after up to [facility] and chair today, tearful when talking.  RN reports pain hard to control.  Spoke with pt who reports sat up since about 7 am.  But too painful now to mobilize.  Reports daughters will be in and out but not stay with her.  PT to follow up early am to evaluate mobility and note follow up needs.    Reginia Naas 08/13/2021, 1:33 PM Magda Kiel, PT Acute Rehabilitation Services [id number] Office:[phone] 08/13/2021

## 2021-08-13 NOTE — Progress Notes (Addendum)
1 Day Post-Op  Subjective: CC: Patient with pain around incisions. Pain medication is helping but still having breakthrough pain. No n/v. Tolerating cld. Hasn't mobilized. Voiding. No flatus or bm.   Objective: Vital signs in last 24 hours: Temp:  [97.3 F (36.3 C)-99 F (37.2 C)] 97.3 F (36.3 C) (09/07 0700) Pulse Rate:  [46-77] 77 (09/07 0700) Resp:  [12-18] 16 (09/07 0700) BP: (113-175)/(66-106) 113/66 (09/07 0700) SpO2:  [94 %-100 %] 95 % (09/07 0700) Weight:  [97.1 kg] 97.1 kg (09/06 1417)    Intake/Output from previous day: 09/06 0701 - 09/07 0700 In: 1330.3 [I.V.:939.7; IV Piggyback:315.6] Out: -  Intake/Output this shift: No intake/output data recorded.  PE: Gen:  Alert, NAD, pleasant Card:  RRR Pulm:  CTAB, no W/R/R, effort normal Abd: Soft, mild distension, appropriately tender around laparoscopic incisions, +BS, incisions with glue intact appears well and are without drainage, bleeding, or signs of infection Psych: A&Ox3  Skin: no rashes noted, warm and dry  Lab Results:  Recent Labs    08/11/21 2110 08/13/21 0036  WBC 8.6 9.7  HGB 11.4* 11.0*  HCT 35.2* 32.9*  PLT 301 276   BMET Recent Labs    08/11/21 2110 08/13/21 0036  NA 141 137  K 3.3* 3.0*  CL 104 101  CO2 27 27  GLUCOSE 101* 119*  BUN 9 7*  CREATININE 0.73 0.70  CALCIUM 9.7 9.0   PT/INR No results for input(s): LABPROT, INR in the last 72 hours. CMP     Component Value Date/Time   NA 137 08/13/2021 0036   K 3.0 (L) 08/13/2021 0036   CL 101 08/13/2021 0036   CO2 27 08/13/2021 0036   GLUCOSE 119 (H) 08/13/2021 0036   BUN 7 (L) 08/13/2021 0036   CREATININE 0.70 08/13/2021 0036   CALCIUM 9.0 08/13/2021 0036   PROT 6.7 08/13/2021 0036   ALBUMIN 2.9 (L) 08/13/2021 0036   AST 82 (H) 08/13/2021 0036   ALT 38 08/13/2021 0036   ALKPHOS 114 08/13/2021 0036   BILITOT 0.5 08/13/2021 0036   GFRNONAA >60 08/13/2021 0036   GFRAA >60 01/19/2018 0650   Lipase     Component Value  Date/Time   LIPASE 36 08/11/2021 2110    Studies/Results: CT ABDOMEN PELVIS W CONTRAST  Result Date: 08/12/2021 CLINICAL DATA:  Epigastric pain epigastric pain, nausea EXAM: CT ABDOMEN AND PELVIS WITH CONTRAST TECHNIQUE: Multidetector CT imaging of the abdomen and pelvis was performed using the standard protocol following bolus administration of intravenous contrast. CONTRAST:  46m OMNIPAQUE IOHEXOL 350 MG/ML SOLN COMPARISON:  None. FINDINGS: Lower chest: No acute abnormality. Hepatobiliary: Cholelithiasis is noted. The gallbladder is distended and there is trace pericholecystic fluid within the gallbladder fossa and subtle pericholecystic inflammatory stranding best seen on coronal imaging. Together, the findings are suspicious for changes of acute cholecystitis. The liver is unremarkable. No intra or extrahepatic biliary ductal dilation. Pancreas: Unremarkable Spleen: Unremarkable Adrenals/Urinary Tract: The adrenal glands are unremarkable. The kidneys are normal. Streak artifact partially obscures the bladder, however the visualized portion is unremarkable. Stomach/Bowel: The stomach, small bowel, and large bowel are unremarkable. The appendix is absent. No free intraperitoneal gas or fluid. Vascular/Lymphatic: Moderate aortoiliac atherosclerotic calcification. No aortic aneurysm. Circumaortic left renal vein. No pathologic adenopathy within the abdomen and pelvis. Reproductive: Status post hysterectomy. No adnexal masses. Other: Tiny fat containing umbilical hernia. Musculoskeletal: Right total hip arthroplasty and L4-5 lumbar fusion with instrumentation has been performed. Right L4 hemilaminectomy and resection of the  spinous process noted. No acute bone abnormality. No lytic or blastic bone lesion. IMPRESSION: Cholelithiasis with findings suggesting early acute cholecystitis. Correlation with liver enzymes is recommended. Right upper quadrant sonography may be helpful for confirmation. Aortic  Atherosclerosis (ICD10-I70.0). Electronically Signed   By: Fidela Salisbury M.D.   On: 08/12/2021 01:06   US Abdomen Limited RUQ (LIVER/GB)  Result Date: 08/12/2021 CLINICAL DATA:  Right upper quadrant pain EXAM: ULTRASOUND ABDOMEN LIMITED RIGHT UPPER QUADRANT COMPARISON:  CT 08/12/2021 FINDINGS: Gallbladder: Multiple gallstones layering within the gallbladder measuring up to 1.6 cm. Gallbladder wall is thickened measuring 5 mm. Small amount of pericholecystic fluid. Patient was tender over the gallbladder during the study. Common bile duct: Diameter: Dilated measuring 13 mm. Distal duct is obscured by overlying bowel gas. No visible ductal stones in the visualized duct. Liver: No focal lesion identified. Within normal limits in parenchymal echogenicity. Portal vein is patent on color Doppler imaging with normal direction of blood flow towards the liver. Other: None. IMPRESSION: Cholelithiasis. Gallbladder wall thickening, pericholecystic fluid and tenderness over the gallbladder. The study concerning for active diverticulitis. Common bile duct dilatation. No visible ductal stones although the distal duct is obscured by overlying bowel gas. Electronically Signed   By: Rolm Baptise M.D.   On: 08/12/2021 02:05    Anti-infectives: Anti-infectives (From admission, onward)    Start     Dose/Rate Route Frequency Ordered Stop   08/12/21 0245  cefTRIAXone (ROCEPHIN) 2 g in sodium chloride 0.9 % 100 mL IVPB        2 g 200 mL/hr over 30 Minutes Intravenous  Once 08/12/21 0234 08/12/21 0704   08/12/21 0245  metroNIDAZOLE (FLAGYL) IVPB 500 mg        500 mg 100 mL/hr over 60 Minutes Intravenous  Once 08/12/21 0234 08/12/21 0524        Assessment/Plan POD 1 s/p Laparoscopic Cholecystectomy by Dr. Bobbye Morton on 08/12/2021 - Acute Cholecystitis  - Adv diet - AST elevated likely 2/2 cauterization. LFT's otherwise reassuring post op.  - Adjust pain meds. Appears she has been being prescribed oxy > ultram since her  right total hip arthroplasty back in July  - Pulm toilet  - Mobilize. PT eval. Hx of CVA w/ left sided weakness. Lives alone but does have help from 3 daughters that live nearby.  - Based on diet toleration and PT eval, will recheck this PM for possible d/c  FEN - Adv to [facility] diet. Replace K  VTE - SCDs, Start LMWH. Hgb stable.  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

## 2021-08-13 NOTE — Care Management Obs Status (Signed)
[place] NOTIFICATION   Patient Details  Name: Ellen Henry MRN: [id number] Date of Birth: 08/15/1946   Medicare Observation Status Notification Given:  Yes    Sharin Mons, RN 08/13/2021, 10:43 AM

## 2021-08-14 DIAGNOSIS — M7541 Impingement syndrome of right shoulder: Secondary | ICD-10-CM | POA: Diagnosis present

## 2021-08-14 DIAGNOSIS — K810 Acute cholecystitis: Secondary | ICD-10-CM | POA: Diagnosis present

## 2021-08-14 DIAGNOSIS — G4730 Sleep apnea, unspecified: Secondary | ICD-10-CM | POA: Diagnosis present

## 2021-08-14 DIAGNOSIS — Z79899 Other long term (current) drug therapy: Secondary | ICD-10-CM | POA: Diagnosis not present

## 2021-08-14 DIAGNOSIS — Z20822 Contact with and (suspected) exposure to covid-19: Secondary | ICD-10-CM | POA: Diagnosis present

## 2021-08-14 DIAGNOSIS — E876 Hypokalemia: Secondary | ICD-10-CM | POA: Diagnosis not present

## 2021-08-14 DIAGNOSIS — I10 Essential (primary) hypertension: Secondary | ICD-10-CM | POA: Diagnosis present

## 2021-08-14 DIAGNOSIS — Z87891 Personal history of nicotine dependence: Secondary | ICD-10-CM | POA: Diagnosis not present

## 2021-08-14 DIAGNOSIS — Z91013 Allergy to seafood: Secondary | ICD-10-CM | POA: Diagnosis not present

## 2021-08-14 DIAGNOSIS — I69354 Hemiplegia and hemiparesis following cerebral infarction affecting left non-dominant side: Secondary | ICD-10-CM | POA: Diagnosis not present

## 2021-08-14 DIAGNOSIS — K5900 Constipation, unspecified: Secondary | ICD-10-CM | POA: Diagnosis present

## 2021-08-14 DIAGNOSIS — Z91018 Allergy to other foods: Secondary | ICD-10-CM | POA: Diagnosis not present

## 2021-08-14 DIAGNOSIS — K219 Gastro-esophageal reflux disease without esophagitis: Secondary | ICD-10-CM | POA: Diagnosis present

## 2021-08-14 DIAGNOSIS — G629 Polyneuropathy, unspecified: Secondary | ICD-10-CM | POA: Diagnosis present

## 2021-08-14 DIAGNOSIS — Z96643 Presence of artificial hip joint, bilateral: Secondary | ICD-10-CM | POA: Diagnosis present

## 2021-08-14 DIAGNOSIS — Z981 Arthrodesis status: Secondary | ICD-10-CM | POA: Diagnosis not present

## 2021-08-14 DIAGNOSIS — Z90710 Acquired absence of both cervix and uterus: Secondary | ICD-10-CM | POA: Diagnosis not present

## 2021-08-14 DIAGNOSIS — M109 Gout, unspecified: Secondary | ICD-10-CM | POA: Diagnosis present

## 2021-08-14 DIAGNOSIS — Z7982 Long term (current) use of aspirin: Secondary | ICD-10-CM | POA: Diagnosis not present

## 2021-08-14 DIAGNOSIS — M19011 Primary osteoarthritis, right shoulder: Secondary | ICD-10-CM | POA: Diagnosis present

## 2021-08-14 LAB — COMPREHENSIVE METABOLIC PANEL
ALT: 40 U/L (ref 0–44)
AST: 61 U/L — ABNORMAL HIGH (ref 15–41)
Albumin: 3.1 g/dL — ABNORMAL LOW (ref 3.5–5.0)
Alkaline Phosphatase: 130 U/L — ABNORMAL HIGH (ref 38–126)
Anion gap: 8 (ref 5–15)
BUN: 12 mg/dL (ref 8–23)
CO2: 28 mmol/L (ref 22–32)
Calcium: 8.8 mg/dL — ABNORMAL LOW (ref 8.9–10.3)
Chloride: 104 mmol/L (ref 98–111)
Creatinine, Ser: 0.88 mg/dL (ref 0.44–1.00)
GFR, Estimated: >60 mL/min (ref >60)
Glucose, Bld: 119 mg/dL — ABNORMAL HIGH (ref 70–99)
Potassium: 3.4 mmol/L — ABNORMAL LOW (ref 3.5–5.1)
Sodium: 140 mmol/L (ref 135–145)
Total Bilirubin: 0.7 mg/dL (ref 0.3–1.2)
Total Protein: 7.1 g/dL (ref 6.5–8.1)

## 2021-08-14 LAB — CBC
HCT: 32.7 % — ABNORMAL LOW (ref 36.0–46.0)
Hemoglobin: 10.6 g/dL — ABNORMAL LOW (ref 12.0–15.0)
MCH: 28.3 pg (ref 26.0–34.0)
MCHC: 32.4 g/dL (ref 30.0–36.0)
MCV: 87.4 fL (ref 80.0–100.0)
Platelets: 277 10*3/uL (ref 150–400)
RBC: 3.74 MIL/uL — ABNORMAL LOW (ref 3.87–5.11)
RDW: 14.1 % (ref 11.5–15.5)
WBC: 11.4 10*3/uL — ABNORMAL HIGH (ref 4.0–10.5)
nRBC: 0 % (ref 0.0–0.2)

## 2021-08-14 LAB — SURGICAL PATHOLOGY

## 2021-08-14 MED ORDER — LIDOCAINE 5 % EX PTCH
1.0000 | MEDICATED_PATCH | CUTANEOUS | Status: DC
  Administered 2021-08-14 – 2021-08-15 (×2): 1 via TRANSDERMAL
  Filled 2021-08-14 (×2): qty 1

## 2021-08-14 MED ORDER — POLYETHYLENE GLYCOL 3350 17 G PO PACK: 17.0000 g | PACK | Freq: Every day | ORAL | Status: DC | PRN

## 2021-08-14 MED ORDER — METHOCARBAMOL 500 MG PO TABS
1000.0000 mg | ORAL_TABLET | Freq: Three times a day (TID) | ORAL | Status: DC
  Administered 2021-08-14 – 2021-08-15 (×4): 1000 mg via ORAL
  Filled 2021-08-14 (×4): qty 2

## 2021-08-14 MED ORDER — KETOROLAC TROMETHAMINE 15 MG/ML IJ SOLN
15.0000 mg | Freq: Four times a day (QID) | INTRAMUSCULAR | Status: DC | PRN
  Administered 2021-08-15 (×2): 15 mg via INTRAVENOUS
  Filled 2021-08-14 (×2): qty 1

## 2021-08-14 MED ORDER — MORPHINE SULFATE (PF) 2 MG/ML IV SOLN
2.0000 mg | INTRAVENOUS | Status: DC | PRN
  Administered 2021-08-14 – 2021-08-15 (×2): 2 mg via INTRAVENOUS
  Filled 2021-08-14 (×2): qty 1

## 2021-08-14 MED ORDER — ACETAMINOPHEN 500 MG PO TABS
1000.0000 mg | ORAL_TABLET | Freq: Four times a day (QID) | ORAL | Status: DC
  Administered 2021-08-14 – 2021-08-15 (×3): 1000 mg via ORAL
  Filled 2021-08-14 (×4): qty 2

## 2021-08-14 NOTE — Evaluation (Signed)
Physical Therapy Evaluation Patient Details Name: Ellen Henry MRN: [id number] DOB: Feb 04, 1946 Today's Date: 08/14/2021   History of Present Illness  75yo female who presented on 08/12/21 with epigastric abdominal pain. Received lap chole 08/12/21. PMH gout, HTN, CVA with L residual weakness, lumbar fusion, B TKR, R rotator cuff repair, R THA  Clinical Impression   Patient received in bathroom with NT, pleasant and cooperative. Able to mobilize on a min guard basis with RW, just very slow moving and antalgic. Gait distance limited by pain. Prefers to return directly home at DC, also refuses possibility of staying with family for a bit. Left up in recliner with all needs met. Will benefit from HHPT f/u as well as new RW as her current device is broken.     Follow Up Recommendations Home health PT    Equipment Recommendations  Rolling walker with 5" wheels;3in1 (PT)    Recommendations for Other Services       Precautions / Restrictions Precautions Precautions: Fall Precaution Comments: R anterior total hip 06/2021 Restrictions Weight Bearing Restrictions: No      Mobility  Bed Mobility               General bed mobility comments: in bathroom with NT upon entry    Transfers Overall transfer level: Needs assistance Equipment used: Rolling walker (2 wheeled) Transfers: Sit to/from Stand Sit to Stand: Min guard         General transfer comment: min guard and increased time for all transfers, good awareness of hand placement  Ambulation/Gait Ambulation/Gait assistance: Min guard Gait Distance (Feet): 80 Feet Assistive device: Rolling walker (2 wheeled) Gait Pattern/deviations: Step-through pattern;Decreased step length - right;Decreased step length - left;Decreased stride length;Trunk flexed;Antalgic Gait velocity: decreased   General Gait Details: slow and steady with RW, gait generally antalgic but safe and steady with RW  Stairs            Wheelchair  Mobility    Modified Rankin (Stroke Patients Only)       Balance Overall balance assessment: Mild deficits observed, not formally tested                                           Pertinent Vitals/Pain Pain Assessment: Faces Faces Pain Scale: Hurts little more Pain Location: abdomen Pain Descriptors / Indicators: Aching;Sore;Burning Pain Intervention(s): Limited activity within patient's tolerance;Monitored during session    Home Living Family/patient expects to be discharged to:: Private residence Living Arrangements: Alone Available Help at Discharge: Family;Available PRN/intermittently Type of Home: Apartment (not handicap accessible) Home Access: Level entry (grassy incline)     Home Layout: One level Home Equipment: Walker - 4 wheels;Walker - 2 wheels Additional Comments: has a shower seat, but it is broken    Prior Function Level of Independence: Independent with assistive device(s)         Comments: cooks and cleans, no falls since May prior to Hip replacement     Hand Dominance   Dominant Hand: Right    Extremity/Trunk Assessment   Upper Extremity Assessment Upper Extremity Assessment: Generalized weakness    Lower Extremity Assessment Lower Extremity Assessment: Generalized weakness    Cervical / Trunk Assessment Cervical / Trunk Assessment: Kyphotic  Communication   Communication: No difficulties  Cognition Arousal/Alertness: Awake/alert Behavior During Therapy: WFL for tasks assessed/performed Overall Cognitive Status: Within Functional Limits for tasks assessed  General Comments      Exercises     Assessment/Plan    PT Assessment Patient needs continued PT services  PT Problem List Decreased strength;Decreased knowledge of use of DME;Decreased activity tolerance;Decreased safety awareness;Decreased balance;Pain;Decreased mobility       PT Treatment  Interventions DME instruction;Balance training;Gait training;Functional mobility training;Patient/family education;Therapeutic activities;Therapeutic exercise    PT Goals (Current goals can be found in the Care Plan section)  Acute Rehab PT Goals Patient Stated Goal: go home PT Goal Formulation: With patient Time For Goal Achievement: 08/28/21 Potential to Achieve Goals: Good    Frequency Min 3X/week   Barriers to discharge        Co-evaluation               AM-PAC PT "6 Clicks" Mobility  Outcome Measure Help needed turning from your back to your side while in a flat bed without using bedrails?: A Little Help needed moving from lying on your back to sitting on the side of a flat bed without using bedrails?: A Little Help needed moving to and from a bed to a chair (including a wheelchair)?: A Little Help needed standing up from a chair using your arms (e.g., wheelchair or bedside chair)?: A Little Help needed to walk in hospital room?: A Little Help needed climbing 3-5 steps with a railing? : A Lot 6 Click Score: 17    End of Session   Activity Tolerance: Patient tolerated treatment well Patient left: with call bell/phone within reach;in chair Nurse Communication: Mobility status PT Visit Diagnosis: Muscle weakness (generalized) (M62.81);Difficulty in walking, not elsewhere classified (R26.2);Pain Pain - Right/Left:  (midline) Pain - part of body:  (abdomen)    Time: 8599-2341 PT Time Calculation (min) (ACUTE ONLY): 24 min   Charges:   PT Evaluation $PT Eval Moderate Complexity: 1 Mod PT Treatments $Gait Training: 8-22 mins       Windell Norfolk, DPT, PN2   Supplemental Physical Therapist [place]    Pager [phone] Acute Rehab Office [phone]

## 2021-08-14 NOTE — Progress Notes (Addendum)
2 Days Post-Op  Subjective: CC: Patient reports that her pain has improved but still is having 7/10 pain in her epigastrium and ruq requiring PO and IV pain meds. Tolerating diet. She denies n/v. She is passing flatus. Mobilized with PT.   Objective: Vital signs in last 24 hours: Temp:  [98.5 F (36.9 C)-99.3 F (37.4 C)] 98.5 F (36.9 C) (09/08 0809) Pulse Rate:  [77-86] 77 (09/08 0809) Resp:  [17-20] 20 (09/08 0809) BP: (98-105)/(58-70) 105/69 (09/08 0809) SpO2:  [95 %-99 %] 95 % (09/08 0809)    Intake/Output from previous day: 09/07 0701 - 09/08 0700 In: 417.8 [P.O.:120; I.V.:97.8; IV Piggyback:200] Out: -  Intake/Output this shift: No intake/output data recorded.  PE: Gen:  Alert, NAD, pleasant Card:  RRR Pulm:  CTAB, no W/R/R, effort normal Abd: Soft, mild distension, appropriately tender around laparoscopic incisions, +BS, incisions with glue intact appears well and are without drainage, bleeding, or signs of infection Psych: A&Ox3  Skin: no rashes noted, warm and dry  Lab Results:  Recent Labs    08/11/21 2110 08/13/21 0036  WBC 8.6 9.7  HGB 11.4* 11.0*  HCT 35.2* 32.9*  PLT 301 276   BMET Recent Labs    08/11/21 2110 08/13/21 0036  NA 141 137  K 3.3* 3.0*  CL 104 101  CO2 27 27  GLUCOSE 101* 119*  BUN 9 7*  CREATININE 0.73 0.70  CALCIUM 9.7 9.0   PT/INR No results for input(s): LABPROT, INR in the last 72 hours. CMP     Component Value Date/Time   NA 137 08/13/2021 0036   K 3.0 (L) 08/13/2021 0036   CL 101 08/13/2021 0036   CO2 27 08/13/2021 0036   GLUCOSE 119 (H) 08/13/2021 0036   BUN 7 (L) 08/13/2021 0036   CREATININE 0.70 08/13/2021 0036   CALCIUM 9.0 08/13/2021 0036   PROT 6.7 08/13/2021 0036   ALBUMIN 2.9 (L) 08/13/2021 0036   AST 82 (H) 08/13/2021 0036   ALT 38 08/13/2021 0036   ALKPHOS 114 08/13/2021 0036   BILITOT 0.5 08/13/2021 0036   GFRNONAA >60 08/13/2021 0036   GFRAA >60 01/19/2018 0650   Lipase     Component  Value Date/Time   LIPASE 36 08/11/2021 2110    Studies/Results: No results found.  Anti-infectives: Anti-infectives (From admission, onward)    Start     Dose/Rate Route Frequency Ordered Stop   08/12/21 0245  cefTRIAXone (ROCEPHIN) 2 g in sodium chloride 0.9 % 100 mL IVPB        2 g 200 mL/hr over 30 Minutes Intravenous  Once 08/12/21 0234 08/12/21 0704   08/12/21 0245  metroNIDAZOLE (FLAGYL) IVPB 500 mg        500 mg 100 mL/hr over 60 Minutes Intravenous  Once 08/12/21 0234 08/12/21 0524        Assessment/Plan POD 2 s/p Laparoscopic Cholecystectomy by Dr. Bobbye Morton on 08/12/2021 - Acute Cholecystitis  - Repeat labs  - Maximize PO pain meds and wean IV pain meds. She has been being prescribed oxy > ultram since her right total hip arthroplasty back in July  - Pulm toilet  - Mobilize. PT rec HH   FEN - HH diet.  VTE - SCDs, Lovenox  ID - Rocephin/Flagyl peri-op, non currently    Hypokalemia - replaced yesterday. AM labs pending.  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

## 2021-08-14 NOTE — Progress Notes (Signed)
Patient up in chair. Instructed nurse to reconsult IV team when patient is back in chair. Fran Lowes, RN VAST

## 2021-08-15 DIAGNOSIS — Z8719 Personal history of other diseases of the digestive system: Secondary | ICD-10-CM

## 2021-08-15 DIAGNOSIS — Z9049 Acquired absence of other specified parts of digestive tract: Secondary | ICD-10-CM

## 2021-08-15 DIAGNOSIS — R1013 Epigastric pain: Secondary | ICD-10-CM

## 2021-08-15 LAB — COMPREHENSIVE METABOLIC PANEL
ALT: 36 U/L (ref 0–44)
AST: 53 U/L — ABNORMAL HIGH (ref 15–41)
Albumin: 2.8 g/dL — ABNORMAL LOW (ref 3.5–5.0)
Alkaline Phosphatase: 118 U/L (ref 38–126)
Anion gap: 8 (ref 5–15)
BUN: 13 mg/dL (ref 8–23)
CO2: 25 mmol/L (ref 22–32)
Calcium: 8.6 mg/dL — ABNORMAL LOW (ref 8.9–10.3)
Chloride: 104 mmol/L (ref 98–111)
Creatinine, Ser: 0.7 mg/dL (ref 0.44–1.00)
GFR, Estimated: >60 mL/min (ref >60)
Glucose, Bld: 92 mg/dL (ref 70–99)
Potassium: 3.5 mmol/L (ref 3.5–5.1)
Sodium: 137 mmol/L (ref 135–145)
Total Bilirubin: 0.6 mg/dL (ref 0.3–1.2)
Total Protein: 6.4 g/dL — ABNORMAL LOW (ref 6.5–8.1)

## 2021-08-15 LAB — CBC
HCT: 28.8 % — ABNORMAL LOW (ref 36.0–46.0)
Hemoglobin: 9.3 g/dL — ABNORMAL LOW (ref 12.0–15.0)
MCH: 28.1 pg (ref 26.0–34.0)
MCHC: 32.3 g/dL (ref 30.0–36.0)
MCV: 87 fL (ref 80.0–100.0)
Platelets: 258 10*3/uL (ref 150–400)
RBC: 3.31 MIL/uL — ABNORMAL LOW (ref 3.87–5.11)
RDW: 14 % (ref 11.5–15.5)
WBC: 9.2 10*3/uL (ref 4.0–10.5)
nRBC: 0 % (ref 0.0–0.2)

## 2021-08-15 MED ORDER — OXYCODONE HCL 5 MG PO TABS: 10.0000 mg | ORAL_TABLET | Freq: Four times a day (QID) | ORAL | 0 refills | Status: AC | PRN

## 2021-08-15 MED ORDER — POLYETHYLENE GLYCOL 3350 17 G PO PACK: 17.0000 g | PACK | Freq: Every day | ORAL | Status: DC | PRN

## 2021-08-15 MED ORDER — IBUPROFEN 800 MG PO TABS: 800.0000 mg | ORAL_TABLET | Freq: Three times a day (TID) | ORAL | Status: DC | PRN

## 2021-08-15 NOTE — Progress Notes (Signed)
3 Days Post-Op  Subjective: CC: Abdominal pain improved from yesterday and requiring less iv pain medication. Tolerating diet without nausea, emesis, worsened abdominal pain. Passing flatus.   Objective: Vital signs in last 24 hours: Temp:  [98.4 F (36.9 C)-98.6 F (37 C)] 98.6 F (37 C) (09/08 2100) Pulse Rate:  [81-82] 82 (09/08 2100) Resp:  [17-18] 18 (09/08 2100) BP: (116-119)/(73-77) 116/73 (09/08 2100) SpO2:  [98 %] 98 % (09/08 2100)    Intake/Output from previous day: 09/08 0701 - 09/09 0700 In: 120 [P.O.:120] Out: -  Intake/Output this shift: No intake/output data recorded.  PE: Gen:  Alert, NAD, pleasant Card:  radial pulses normal Pulm:  effort normal on room air Abd: Soft, non distended, appropriately tender around laparoscopic incisions with greatest TTP at epigastrium, +BS, incisions with glue intact and are without drainage, bleeding, or signs of infection Psych: A&Ox3  Skin: no rashes noted, warm and dry  Lab Results:  Recent Labs    08/14/21 1356 08/15/21 0039  WBC 11.4* 9.2  HGB 10.6* 9.3*  HCT 32.7* 28.8*  PLT 277 258    BMET Recent Labs    08/14/21 1356 08/15/21 0039  NA 140 137  K 3.4* 3.5  CL 104 104  CO2 28 25  GLUCOSE 119* 92  BUN 12 13  CREATININE 0.88 0.70  CALCIUM 8.8* 8.6*    PT/INR No results for input(s): LABPROT, INR in the last 72 hours. CMP     Component Value Date/Time   NA 137 08/15/2021 0039   K 3.5 08/15/2021 0039   CL 104 08/15/2021 0039   CO2 25 08/15/2021 0039   GLUCOSE 92 08/15/2021 0039   BUN 13 08/15/2021 0039   CREATININE 0.70 08/15/2021 0039   CALCIUM 8.6 (L) 08/15/2021 0039   PROT 6.4 (L) 08/15/2021 0039   ALBUMIN 2.8 (L) 08/15/2021 0039   AST 53 (H) 08/15/2021 0039   ALT 36 08/15/2021 0039   ALKPHOS 118 08/15/2021 0039   BILITOT 0.6 08/15/2021 0039   GFRNONAA >60 08/15/2021 0039   GFRAA >60 01/19/2018 0650   Lipase     Component Value Date/Time   LIPASE 36 08/11/2021 2110     Studies/Results: No results found.  Anti-infectives: Anti-infectives (From admission, onward)    Start     Dose/Rate Route Frequency Ordered Stop   08/12/21 0245  cefTRIAXone (ROCEPHIN) 2 g in sodium chloride 0.9 % 100 mL IVPB        2 g 200 mL/hr over 30 Minutes Intravenous  Once 08/12/21 0234 08/12/21 0704   08/12/21 0245  metroNIDAZOLE (FLAGYL) IVPB 500 mg        500 mg 100 mL/hr over 60 Minutes Intravenous  Once 08/12/21 0234 08/12/21 0524        Assessment/Plan POD 3 s/p Laparoscopic Cholecystectomy by Dr. Bobbye Morton on 08/12/2021 - Acute Cholecystitis  - normal WBC and LFTs WNL except for slightly elevated AST at 53  - Maximize PO pain meds and wean IV pain meds. She has been being prescribed oxy > ultram since her right total hip arthroplasty back in July  - Pulm toilet  - Mobilize. PT rec HH  - pain improving. Will check back this afternoon and if she continues to feel well then possible pm discharge   FEN - HH diet.  VTE - SCDs, Lovenox  ID - Rocephin/Flagyl peri-op, non currently    Hypokalemia - replaced 9/7. resolved.  HTN Hx CVA w/ left sided weakness Hx right total  hip arthroplasty in July 2022 GERD   LOS: 1 day    Winferd Humphrey , [facility] 08/15/2021, 8:13 AM Please see Amion for pager number during day hours 7:00am-4:30pm

## 2021-08-15 NOTE — Progress Notes (Signed)
Physical Therapy Treatment Patient Details Name: Ellen Henry MRN: [id number] DOB: 13-Mar-1946 Today's Date: 08/15/2021    History of Present Illness 75yo female who presented on 08/12/21 with epigastric abdominal pain. Received lap chole 08/12/21. PMH gout, HTN, CVA with L residual weakness, lumbar fusion, B TKR, R rotator cuff repair, R THA    PT Comments    Patient progressing to longer distance ambulation in the hallway and able to practice bed mobility a little.  She did need help for her legs to get into bed, but mobilized mostly with S for safety though using walker appropriately.  Feel she can d/c with intermittent assist from family and follow up [place].  Needs RW as hers is in disrepair.    Follow Up Recommendations        Equipment Recommendations  Rolling walker with 5" wheels;3in1 (PT)    Recommendations for Other Services       Precautions / Restrictions Precautions Precautions: Fall Precaution Comments: R anterior total hip 06/2021    Mobility  Bed Mobility Overal bed mobility: Needs Assistance Bed Mobility: Sit to Sidelying         Sit to sidelying: Mod assist General bed mobility comments: cues for technique, assist for legs into bed    Transfers Overall transfer level: Needs assistance Equipment used: Rolling walker (2 wheeled) Transfers: Sit to/from Stand Sit to Stand: Supervision         General transfer comment: up from recliner, then toilet unaided, S for safety  Ambulation/Gait Ambulation/Gait assistance: Supervision Gait Distance (Feet): 200 Feet Assistive device: Rolling walker (2 wheeled) Gait Pattern/deviations: Step-through pattern;Decreased stride length;Antalgic;Shuffle     General Gait Details: walking in hallway with S to occasional CGA for safety due to stopping and grimacing, holding her stomach, but reports okay   Stairs             Wheelchair Mobility    Modified Rankin (Stroke Patients Only)       Balance  Overall balance assessment: Mild deficits observed, not formally tested                                          Cognition Arousal/Alertness: Awake/alert Behavior During Therapy: WFL for tasks assessed/performed Overall Cognitive Status: Within Functional Limits for tasks assessed                                        Exercises      General Comments General comments (skin integrity, edema, etc.): toileted basically on her own, S washing hands at sink with cues to get closer      Pertinent Vitals/Pain Pain Score: 7  Pain Location: abdomen Pain Descriptors / Indicators: Grimacing;Guarding Pain Intervention(s): Monitored during session;Repositioned;Premedicated before session    Home Living                      Prior Function            PT Goals (current goals can now be found in the care plan section) Progress towards PT goals: Progressing toward goals    Frequency    Min 3X/week      PT Plan Current plan remains appropriate    Co-evaluation  AM-PAC PT "6 Clicks" Mobility   Outcome Measure  Help needed turning from your back to your side while in a flat bed without using bedrails?: A Little Help needed moving from lying on your back to sitting on the side of a flat bed without using bedrails?: A Little Help needed moving to and from a bed to a chair (including a wheelchair)?: None Help needed standing up from a chair using your arms (e.g., wheelchair or bedside chair)?: None Help needed to walk in hospital room?: A Little Help needed climbing 3-5 steps with a railing? : A Little 6 Click Score: 20    End of Session   Activity Tolerance: Patient tolerated treatment well Patient left: in bed;with call bell/phone within reach   PT Visit Diagnosis: Muscle weakness (generalized) (M62.81);Difficulty in walking, not elsewhere classified (R26.2);Pain Pain - Right/Left:  (central) Pain - part of body:   (abdomen)     Time: [id number] PT Time Calculation (min) (ACUTE ONLY): 27 min  Charges:  $Gait Training: 8-22 mins $Therapeutic Activity: 8-22 mins                     Magda Kiel, PT Acute Rehabilitation Services [phone] Office:[phone] 08/15/2021    Reginia Naas 08/15/2021, 11:32 AM

## 2021-08-15 NOTE — Discharge Summary (Signed)
[facility] Discharge Summary   Patient ID: CHANIN CLOUGHERTY MRN: [id number] DOB/AGE: 04/06/1946 75 y.o.  Admit date: 08/11/2021 Discharge date: 08/15/2021  Admitting Diagnosis: Acute cholecystitis   Discharge Diagnosis Patient Active Problem List   Diagnosis Date Noted   History of laparoscopic cholecystectomy 08/15/2021   History of cholecystitis 08/15/2021   Status post total hip replacement, right 06/13/2021   Status post total replacement of right hip 06/11/2021   Chronic constipation    Chronic pain syndrome    Radicular pain    Chronic bilateral low back pain without sciatica    Dysphagia, post-stroke    Left hemiparesis ([place])    Acute ischemic stroke ([place]) 12/15/2016   Morbid obesity ([place]) 02/15/2015   Essential hypertension, benign 09/07/2014   Hyperlipidemia 09/05/2014   Gout 09/05/2014   Neuropathy 09/05/2014   Radiculopathy 08/29/2014    Consultants none  Imaging: No results found.  Procedures Dr. Bobbye Morton (08/12/21) - Laparoscopic Cholecystectomy  Hospital Course:  75 year old female who presented to [facility] ED with epigastric abdominal pain.  Workup showed early acute cholecystitis.  Patient was admitted and underwent procedure listed above.  Tolerated procedure well and was transferred to the floor.  Diet was advanced as tolerated.  She has history of recent right hip arthroplasty in July and had been on pain medications since. She had some poor post operative pain control initially but this improved significantly by date of discharge and labs within normal limits. She worked with therapies who recommended home health DME (rolling walker and 3 n 1) and home health PT which was arranged prior to discharge. On POD3, the patient was voiding well, tolerating diet, ambulating well, pain well controlled, vital signs stable, incisions c/d/i and felt stable for discharge home with [facility] and support of her daughters.  Patient will follow up in our office in 3  weeks and knows to call with questions or concerns.  She was discharged with oxycodone 10 mg (#24/0) to alternate with ibuprofen and tylenol for pain control. She was instructed not to take tramadol from recent hip surgery when taking oxycodone. She was instructed to take over the counter laxatives for constipation.   I or a member of my team have reviewed this patient in the Controlled Substance Database.  Allergies as of 08/15/2021       Reactions   Food Allergy Formula Other (See Comments)   Shellfish-banana-beef-flares up her gout        Medication List     TAKE these medications    acetaminophen 500 MG tablet Commonly known as: TYLENOL Take 500 mg by mouth every 6 (six) hours as needed for mild pain or headache.   aspirin EC 81 MG tablet Take 81 mg by mouth daily. Swallow whole.   aspirin 325 MG EC tablet Take 1 tablet (325 mg total) by mouth 2 (two) times daily after a meal.   diclofenac Sodium 1 % Gel Commonly known as: VOLTAREN Apply 1 application topically 4 (four) times daily as needed (pain).   docusate sodium 100 MG capsule Commonly known as: COLACE Take 100 mg by mouth 2 (two) times daily as needed for mild constipation.   gabapentin 300 MG capsule Commonly known as: NEURONTIN Take 300 mg by mouth in the morning and at bedtime.   ibuprofen 800 MG tablet Commonly known as: ADVIL Take 1 tablet (800 mg total) by mouth every 8 (eight) hours as needed for mild pain or moderate pain (alternate with tylenol and  oxycodone).   indapamide 1.25 MG tablet Commonly known as: LOZOL Take 1.25 mg by mouth every morning.   methocarbamol 500 MG tablet Commonly known as: ROBAXIN Take 1 tablet (500 mg total) by mouth every 8 (eight) hours as needed for muscle spasms.   oxyCODONE 5 MG immediate release tablet Commonly known as: Oxy IR/ROXICODONE Take 2 tablets (10 mg total) by mouth every 6 (six) hours as needed for up to 3 days for severe pain or moderate pain  (alternate with tylenol and ibuprofen. Do NOT take tramadol when taking this medication). What changed:  how much to take reasons to take this   polyethylene glycol 17 g packet Commonly known as: MIRALAX / GLYCOLAX Take 17 g by mouth daily as needed for mild constipation or moderate constipation.   rosuvastatin 20 MG tablet Commonly known as: CRESTOR Take 20 mg by mouth at bedtime.   telmisartan 40 MG tablet Commonly known as: MICARDIS Take 40 mg by mouth every morning.   traMADol 50 MG tablet Commonly known as: ULTRAM Take 50 mg by mouth every 8 (eight) hours as needed for moderate pain.               Durable Medical Equipment  (From admission, onward)           Start     Ordered   08/14/21 1123  For home use only DME 3 n 1  Once        08/14/21 1123   08/14/21 1123  For home use only DME Walker rolling  Once       Question Answer Comment  Walker: With [place] Wheels   Patient needs a walker to treat with the following condition Physical deconditioning      08/14/21 1123              Follow-up Information     [facility] Follow up on 09/02/2021.   Specialty: General Surgery Why: 9/27 at 10am. Please arrive 45 minutes prior to your appointment for paperwork. Please bring a copy of your photo ID and insurance card. Contact information: [address] [place] [address] [phone]         Lucianne Lei, MD Follow up.   Specialty: Family Medicine Contact information: [place] [address]   [phone]         [facility] Follow up.   Specialty: Home Health Services Contact information: [place] [address] [phone]                 Signed: Caroll Rancher [facility] 08/15/2021, 3:43 PM Please see Amion for pager number during day hours 7:00am-4:30pm

## 2021-08-15 NOTE — TOC Initial Note (Addendum)
Transition of Care [facility]) - Initial/Assessment Note    Patient Details  Name: Ellen Henry MRN: [id number] Date of Birth: 1946/07/15  Transition of Care [facility]) CM/SW Contact:    Marilu Favre, RN Phone Number: 08/15/2021, 12:26 PM  Clinical Narrative:                  Spoke to patient at bedside. From home alone, has assistance.   Patient has a walker at home however "it is broken" patient requesting new walker. NCM will order through [facility], if it has been less then 5 years insurance will not cover another , and it will be private pay, patient aware.  Patient already has 3 in1.   Patient has had [facility] in the past and would like them back. NCM left Christy with [facility] a message awaiting return call.  Patient's daughter can provide transportation home  [facility] unable to accept referral . Patient aware and has no preference   Tommi Rumps with [facility] can accept HHPT referral, patient will not be seen this weekend, messaged PA Expected Discharge Plan: [address]     Patient Goals and CMS Choice   CMS Medicare.gov Compare Post Acute Care list provided to:: Patient Choice offered to / list presented to : Patient  Expected Discharge Plan and Services Expected Discharge Plan: [place]   Discharge Planning Services: CM Consult Post Acute Care Choice: Home Health, Durable Medical Equipment Living arrangements for the past 2 months: Single Family Home                 DME Arranged: 3-N-1 DME Agency: NA       HH Arranged: PT HH Agency: [place] Date [place]: 08/15/21 Time [place]: 1225 Representative spoke with at [place]: Christy left message  Prior Living Arrangements/Services Living arrangements for the past 2 months: Single Family Home Lives with:: Self Patient language and need for interpreter reviewed:: Yes Do you feel safe going back to the place where you live?: Yes       Need for Family Participation in Patient Care: Yes (Comment) Care giver support system in place?: Yes (comment) Current home services: DME Criminal Activity/Legal Involvement Pertinent to Current Situation/Hospitalization: No - Comment as needed  Activities of Daily Living Home Assistive Devices/Equipment: Gilford Rile (specify type) ADL Screening (condition at time of admission) Patient's cognitive ability adequate to safely complete daily activities?: Yes Is the patient deaf or have difficulty hearing?: No Does the patient have difficulty seeing, even when wearing glasses/contacts?: No Does the patient have difficulty concentrating, remembering, or making decisions?: No Patient able to express need for assistance with ADLs?: Yes Does the patient have difficulty dressing or bathing?: Yes Independently performs ADLs?: No Does the patient have difficulty walking or climbing stairs?: Yes Weakness of Legs: Both Weakness of Arms/Hands: Both  Permission Sought/Granted   Permission granted to share information with : No              Emotional Assessment Appearance:: Appears stated age Attitude/Demeanor/Rapport: Engaged Affect (typically observed): Accepting Orientation: : Oriented to Self, Oriented to Place, Oriented to  Time, Oriented to Situation Alcohol / Substance Use: Not Applicable Psych Involvement: No (comment)  Admission diagnosis:  Acute cholecystitis [K81.0] Epigastric pain [R10.13] Patient Active Problem List   Diagnosis Date Noted   Acute cholecystitis 08/12/2021   Status post total hip replacement, right 06/13/2021   Primary osteoarthritis of right hip  06/11/2021   Status post total replacement of right hip 06/11/2021   Complete tear of right rotator cuff 01/18/2018   Impingement syndrome of right shoulder 01/18/2018   AC (acromioclavicular) arthritis 01/18/2018   Rotator cuff tear 01/18/2018   Low oxygen saturation 12/09/2017   Chronic constipation    Failed back  syndrome    Chronic pain syndrome    Radicular pain    Chronic bilateral low back pain without sciatica    Dysphagia, post-stroke    Left hemiparesis ([place])    Acute ischemic stroke ([place]) 12/15/2016   Urinary retention 02/25/2015   Primary osteoarthritis of left knee 02/15/2015   Morbid obesity ([place]) 02/15/2015   Essential hypertension, benign 09/07/2014   Hyperlipidemia 09/05/2014   Gout 09/05/2014   Neuropathy 09/05/2014   Radiculopathy 08/29/2014   PCP:  Lucianne Lei, MD Pharmacy:   [id number] [id number]: [phone]: [phone]    Social Determinants of Health (SDOH) Interventions    Readmission Risk Interventions No flowsheet data found.

## 2021-08-19 DIAGNOSIS — M19011 Primary osteoarthritis, right shoulder: Secondary | ICD-10-CM | POA: Diagnosis not present

## 2021-08-19 DIAGNOSIS — I10 Essential (primary) hypertension: Secondary | ICD-10-CM | POA: Diagnosis not present

## 2021-08-19 DIAGNOSIS — E785 Hyperlipidemia, unspecified: Secondary | ICD-10-CM | POA: Diagnosis not present

## 2021-08-19 DIAGNOSIS — M19012 Primary osteoarthritis, left shoulder: Secondary | ICD-10-CM | POA: Diagnosis not present

## 2021-08-19 DIAGNOSIS — Z48815 Encounter for surgical aftercare following surgery on the digestive system: Secondary | ICD-10-CM | POA: Diagnosis not present

## 2021-08-21 DIAGNOSIS — E785 Hyperlipidemia, unspecified: Secondary | ICD-10-CM | POA: Diagnosis not present

## 2021-08-21 DIAGNOSIS — M19012 Primary osteoarthritis, left shoulder: Secondary | ICD-10-CM | POA: Diagnosis not present

## 2021-08-21 DIAGNOSIS — K9189 Other postprocedural complications and disorders of digestive system: Secondary | ICD-10-CM | POA: Diagnosis not present

## 2021-08-21 DIAGNOSIS — I10 Essential (primary) hypertension: Secondary | ICD-10-CM | POA: Diagnosis not present

## 2021-08-21 DIAGNOSIS — M19011 Primary osteoarthritis, right shoulder: Secondary | ICD-10-CM | POA: Diagnosis not present

## 2021-08-21 DIAGNOSIS — Z8673 Personal history of transient ischemic attack (TIA), and cerebral infarction without residual deficits: Secondary | ICD-10-CM | POA: Diagnosis not present

## 2021-08-21 DIAGNOSIS — Z48815 Encounter for surgical aftercare following surgery on the digestive system: Secondary | ICD-10-CM | POA: Diagnosis not present

## 2021-08-21 DIAGNOSIS — M130 Polyarthritis, unspecified: Secondary | ICD-10-CM | POA: Diagnosis not present

## 2021-08-22 DIAGNOSIS — M19011 Primary osteoarthritis, right shoulder: Secondary | ICD-10-CM | POA: Diagnosis not present

## 2021-08-22 DIAGNOSIS — E785 Hyperlipidemia, unspecified: Secondary | ICD-10-CM | POA: Diagnosis not present

## 2021-08-22 DIAGNOSIS — M19012 Primary osteoarthritis, left shoulder: Secondary | ICD-10-CM | POA: Diagnosis not present

## 2021-08-22 DIAGNOSIS — Z48815 Encounter for surgical aftercare following surgery on the digestive system: Secondary | ICD-10-CM | POA: Diagnosis not present

## 2021-08-22 DIAGNOSIS — I10 Essential (primary) hypertension: Secondary | ICD-10-CM | POA: Diagnosis not present

## 2021-08-25 DIAGNOSIS — M19011 Primary osteoarthritis, right shoulder: Secondary | ICD-10-CM | POA: Diagnosis not present

## 2021-08-25 DIAGNOSIS — I10 Essential (primary) hypertension: Secondary | ICD-10-CM | POA: Diagnosis not present

## 2021-08-25 DIAGNOSIS — Z48815 Encounter for surgical aftercare following surgery on the digestive system: Secondary | ICD-10-CM | POA: Diagnosis not present

## 2021-08-25 DIAGNOSIS — M19012 Primary osteoarthritis, left shoulder: Secondary | ICD-10-CM | POA: Diagnosis not present

## 2021-08-25 DIAGNOSIS — E785 Hyperlipidemia, unspecified: Secondary | ICD-10-CM | POA: Diagnosis not present

## 2021-08-26 DIAGNOSIS — M19012 Primary osteoarthritis, left shoulder: Secondary | ICD-10-CM | POA: Diagnosis not present

## 2021-08-26 DIAGNOSIS — E785 Hyperlipidemia, unspecified: Secondary | ICD-10-CM | POA: Diagnosis not present

## 2021-08-26 DIAGNOSIS — M19011 Primary osteoarthritis, right shoulder: Secondary | ICD-10-CM | POA: Diagnosis not present

## 2021-08-26 DIAGNOSIS — I10 Essential (primary) hypertension: Secondary | ICD-10-CM | POA: Diagnosis not present

## 2021-08-26 DIAGNOSIS — Z48815 Encounter for surgical aftercare following surgery on the digestive system: Secondary | ICD-10-CM | POA: Diagnosis not present

## 2021-08-28 DIAGNOSIS — E785 Hyperlipidemia, unspecified: Secondary | ICD-10-CM | POA: Diagnosis not present

## 2021-08-28 DIAGNOSIS — I10 Essential (primary) hypertension: Secondary | ICD-10-CM | POA: Diagnosis not present

## 2021-08-28 DIAGNOSIS — M19012 Primary osteoarthritis, left shoulder: Secondary | ICD-10-CM | POA: Diagnosis not present

## 2021-08-28 DIAGNOSIS — Z48815 Encounter for surgical aftercare following surgery on the digestive system: Secondary | ICD-10-CM | POA: Diagnosis not present

## 2021-08-28 DIAGNOSIS — M19011 Primary osteoarthritis, right shoulder: Secondary | ICD-10-CM | POA: Diagnosis not present

## 2021-09-01 DIAGNOSIS — M19012 Primary osteoarthritis, left shoulder: Secondary | ICD-10-CM | POA: Diagnosis not present

## 2021-09-01 DIAGNOSIS — M19011 Primary osteoarthritis, right shoulder: Secondary | ICD-10-CM | POA: Diagnosis not present

## 2021-09-01 DIAGNOSIS — Z48815 Encounter for surgical aftercare following surgery on the digestive system: Secondary | ICD-10-CM | POA: Diagnosis not present

## 2021-09-01 DIAGNOSIS — I10 Essential (primary) hypertension: Secondary | ICD-10-CM | POA: Diagnosis not present

## 2021-09-01 DIAGNOSIS — E785 Hyperlipidemia, unspecified: Secondary | ICD-10-CM | POA: Diagnosis not present

## 2021-09-03 DIAGNOSIS — E785 Hyperlipidemia, unspecified: Secondary | ICD-10-CM | POA: Diagnosis not present

## 2021-09-03 DIAGNOSIS — I10 Essential (primary) hypertension: Secondary | ICD-10-CM | POA: Diagnosis not present

## 2021-09-03 DIAGNOSIS — M19011 Primary osteoarthritis, right shoulder: Secondary | ICD-10-CM | POA: Diagnosis not present

## 2021-09-03 DIAGNOSIS — Z48815 Encounter for surgical aftercare following surgery on the digestive system: Secondary | ICD-10-CM | POA: Diagnosis not present

## 2021-09-03 DIAGNOSIS — M19012 Primary osteoarthritis, left shoulder: Secondary | ICD-10-CM | POA: Diagnosis not present

## 2021-09-05 DIAGNOSIS — Z720 Tobacco use: Secondary | ICD-10-CM | POA: Diagnosis not present

## 2021-09-05 DIAGNOSIS — E785 Hyperlipidemia, unspecified: Secondary | ICD-10-CM | POA: Diagnosis not present

## 2021-09-05 DIAGNOSIS — M130 Polyarthritis, unspecified: Secondary | ICD-10-CM | POA: Diagnosis not present

## 2021-09-05 DIAGNOSIS — I10 Essential (primary) hypertension: Secondary | ICD-10-CM | POA: Diagnosis not present

## 2021-09-08 DIAGNOSIS — Z48815 Encounter for surgical aftercare following surgery on the digestive system: Secondary | ICD-10-CM | POA: Diagnosis not present

## 2021-09-08 DIAGNOSIS — E785 Hyperlipidemia, unspecified: Secondary | ICD-10-CM | POA: Diagnosis not present

## 2021-09-08 DIAGNOSIS — I10 Essential (primary) hypertension: Secondary | ICD-10-CM | POA: Diagnosis not present

## 2021-09-08 DIAGNOSIS — M19011 Primary osteoarthritis, right shoulder: Secondary | ICD-10-CM | POA: Diagnosis not present

## 2021-09-08 DIAGNOSIS — M19012 Primary osteoarthritis, left shoulder: Secondary | ICD-10-CM | POA: Diagnosis not present

## 2021-09-11 DIAGNOSIS — M19012 Primary osteoarthritis, left shoulder: Secondary | ICD-10-CM | POA: Diagnosis not present

## 2021-09-11 DIAGNOSIS — I10 Essential (primary) hypertension: Secondary | ICD-10-CM | POA: Diagnosis not present

## 2021-09-11 DIAGNOSIS — Z48815 Encounter for surgical aftercare following surgery on the digestive system: Secondary | ICD-10-CM | POA: Diagnosis not present

## 2021-09-11 DIAGNOSIS — E785 Hyperlipidemia, unspecified: Secondary | ICD-10-CM | POA: Diagnosis not present

## 2021-09-11 DIAGNOSIS — M19011 Primary osteoarthritis, right shoulder: Secondary | ICD-10-CM | POA: Diagnosis not present

## 2021-09-17 DIAGNOSIS — M19012 Primary osteoarthritis, left shoulder: Secondary | ICD-10-CM | POA: Diagnosis not present

## 2021-09-17 DIAGNOSIS — Z48815 Encounter for surgical aftercare following surgery on the digestive system: Secondary | ICD-10-CM | POA: Diagnosis not present

## 2021-09-17 DIAGNOSIS — M19011 Primary osteoarthritis, right shoulder: Secondary | ICD-10-CM | POA: Diagnosis not present

## 2021-09-17 DIAGNOSIS — I10 Essential (primary) hypertension: Secondary | ICD-10-CM | POA: Diagnosis not present

## 2021-09-17 DIAGNOSIS — E785 Hyperlipidemia, unspecified: Secondary | ICD-10-CM | POA: Diagnosis not present

## 2021-09-18 DIAGNOSIS — M25552 Pain in left hip: Secondary | ICD-10-CM | POA: Diagnosis not present

## 2021-09-18 DIAGNOSIS — Z96641 Presence of right artificial hip joint: Secondary | ICD-10-CM | POA: Diagnosis not present

## 2021-09-18 DIAGNOSIS — M7062 Trochanteric bursitis, left hip: Secondary | ICD-10-CM | POA: Diagnosis not present

## 2021-09-18 DIAGNOSIS — M25551 Pain in right hip: Secondary | ICD-10-CM | POA: Diagnosis not present

## 2021-09-24 DIAGNOSIS — I10 Essential (primary) hypertension: Secondary | ICD-10-CM | POA: Diagnosis not present

## 2021-09-24 DIAGNOSIS — E785 Hyperlipidemia, unspecified: Secondary | ICD-10-CM | POA: Diagnosis not present

## 2021-09-24 DIAGNOSIS — M19011 Primary osteoarthritis, right shoulder: Secondary | ICD-10-CM | POA: Diagnosis not present

## 2021-09-24 DIAGNOSIS — Z48815 Encounter for surgical aftercare following surgery on the digestive system: Secondary | ICD-10-CM | POA: Diagnosis not present

## 2021-09-24 DIAGNOSIS — M19012 Primary osteoarthritis, left shoulder: Secondary | ICD-10-CM | POA: Diagnosis not present

## 2021-09-25 DIAGNOSIS — Z23 Encounter for immunization: Secondary | ICD-10-CM | POA: Diagnosis not present

## 2021-09-25 DIAGNOSIS — E1169 Type 2 diabetes mellitus with other specified complication: Secondary | ICD-10-CM | POA: Diagnosis not present

## 2021-09-25 DIAGNOSIS — I10 Essential (primary) hypertension: Secondary | ICD-10-CM | POA: Diagnosis not present

## 2021-10-01 DIAGNOSIS — M19012 Primary osteoarthritis, left shoulder: Secondary | ICD-10-CM | POA: Diagnosis not present

## 2021-10-01 DIAGNOSIS — I10 Essential (primary) hypertension: Secondary | ICD-10-CM | POA: Diagnosis not present

## 2021-10-01 DIAGNOSIS — Z48815 Encounter for surgical aftercare following surgery on the digestive system: Secondary | ICD-10-CM | POA: Diagnosis not present

## 2021-10-01 DIAGNOSIS — E785 Hyperlipidemia, unspecified: Secondary | ICD-10-CM | POA: Diagnosis not present

## 2021-10-01 DIAGNOSIS — M19011 Primary osteoarthritis, right shoulder: Secondary | ICD-10-CM | POA: Diagnosis not present

## 2021-10-06 DIAGNOSIS — E785 Hyperlipidemia, unspecified: Secondary | ICD-10-CM | POA: Diagnosis not present

## 2021-10-06 DIAGNOSIS — I10 Essential (primary) hypertension: Secondary | ICD-10-CM | POA: Diagnosis not present

## 2021-10-06 DIAGNOSIS — M130 Polyarthritis, unspecified: Secondary | ICD-10-CM | POA: Diagnosis not present

## 2021-10-08 DIAGNOSIS — I10 Essential (primary) hypertension: Secondary | ICD-10-CM | POA: Diagnosis not present

## 2021-10-08 DIAGNOSIS — E785 Hyperlipidemia, unspecified: Secondary | ICD-10-CM | POA: Diagnosis not present

## 2021-10-08 DIAGNOSIS — M19012 Primary osteoarthritis, left shoulder: Secondary | ICD-10-CM | POA: Diagnosis not present

## 2021-10-08 DIAGNOSIS — Z48815 Encounter for surgical aftercare following surgery on the digestive system: Secondary | ICD-10-CM | POA: Diagnosis not present

## 2021-10-08 DIAGNOSIS — M19011 Primary osteoarthritis, right shoulder: Secondary | ICD-10-CM | POA: Diagnosis not present

## 2021-10-09 DIAGNOSIS — M7061 Trochanteric bursitis, right hip: Secondary | ICD-10-CM | POA: Diagnosis not present

## 2021-10-09 DIAGNOSIS — M7062 Trochanteric bursitis, left hip: Secondary | ICD-10-CM | POA: Diagnosis not present

## 2021-10-15 DIAGNOSIS — M19011 Primary osteoarthritis, right shoulder: Secondary | ICD-10-CM | POA: Diagnosis not present

## 2021-10-15 DIAGNOSIS — E785 Hyperlipidemia, unspecified: Secondary | ICD-10-CM | POA: Diagnosis not present

## 2021-10-15 DIAGNOSIS — M19012 Primary osteoarthritis, left shoulder: Secondary | ICD-10-CM | POA: Diagnosis not present

## 2021-10-15 DIAGNOSIS — Z48815 Encounter for surgical aftercare following surgery on the digestive system: Secondary | ICD-10-CM | POA: Diagnosis not present

## 2021-10-15 DIAGNOSIS — I10 Essential (primary) hypertension: Secondary | ICD-10-CM | POA: Diagnosis not present

## 2021-12-26 DIAGNOSIS — M130 Polyarthritis, unspecified: Secondary | ICD-10-CM | POA: Diagnosis not present

## 2021-12-26 DIAGNOSIS — I10 Essential (primary) hypertension: Secondary | ICD-10-CM | POA: Diagnosis not present

## 2021-12-26 DIAGNOSIS — E785 Hyperlipidemia, unspecified: Secondary | ICD-10-CM | POA: Diagnosis not present

## 2021-12-26 DIAGNOSIS — R7309 Other abnormal glucose: Secondary | ICD-10-CM | POA: Diagnosis not present

## 2022-01-14 ENCOUNTER — Ambulatory Visit: Payer: Medicare Other | Attending: Family Medicine | Admitting: Rehabilitation

## 2022-01-14 ENCOUNTER — Other Ambulatory Visit: Payer: Self-pay

## 2022-01-14 ENCOUNTER — Encounter: Payer: Self-pay | Admitting: Rehabilitation

## 2022-01-14 DIAGNOSIS — R2681 Unsteadiness on feet: Secondary | ICD-10-CM | POA: Diagnosis not present

## 2022-01-14 DIAGNOSIS — M25551 Pain in right hip: Secondary | ICD-10-CM | POA: Diagnosis not present

## 2022-01-14 DIAGNOSIS — M6281 Muscle weakness (generalized): Secondary | ICD-10-CM | POA: Diagnosis not present

## 2022-01-14 DIAGNOSIS — R2689 Other abnormalities of gait and mobility: Secondary | ICD-10-CM | POA: Insufficient documentation

## 2022-01-14 NOTE — Therapy (Signed)
[facility] [address] [place], [place], [address] Phone: [phone]   Fax:  [phone]  Physical Therapy Evaluation  Patient Details  Name: Ellen Henry MRN: [id number] Date of Birth: 11/26/1946 Referring Provider (PT): Lucianne Lei, MD   Encounter Date: 01/14/2022   PT End of Session - 01/14/22 0944     Visit Number 1    Number of Visits 17    Date for PT Re-Evaluation 03/15/22    Authorization Type [employer] Medicare (Progress note needed on visit 10)    Progress Note Due on Visit 10    PT Start Time [phone]    PT Stop Time 0930    PT Time Calculation (min) 44 min    Activity Tolerance Patient tolerated treatment well    Behavior During Therapy [facility] for tasks assessed/performed             Past Medical History:  Diagnosis Date   Acute kidney failure due to procedure    Arthritis    Complication of anesthesia    after nerve block difficult breathing (at [facility] [place] transported to [facility])   Dry skin    GERD (gastroesophageal reflux disease)    "years ago", diet controlled   Gout    H/O pyelonephritis    as a child   Heart murmur    since birth, denies any problems    History of sepsis    after spinal surgery   Hypertension    Impingement syndrome of right shoulder    Neuropathy    Osteoarthritis of AC (acromioclavicular) joint    Right   Pneumonia    Radiculopathy    Rotator cuff tear, right    Sleep apnea    does not use cpap   Stroke ([place]) 12/15/2016   left side weakness    Past Surgical History:  Procedure Laterality Date   ABDOMINAL EXPOSURE N/A 08/29/2014   Procedure: ABDOMINAL EXPOSURE;  Surgeon: Rosetta Posner, MD;  Location: [place];  Service: Vascular;  Laterality: N/A;   ABDOMINAL HYSTERECTOMY     ANTERIOR LUMBAR FUSION N/A 08/29/2014   Procedure: ANTERIOR LUMBAR FUSION 1 LEVEL;  Surgeon: Sinclair Ship, MD;  Location: [place];  Service: Orthopedics;  Laterality:  N/A;  Lumbar 4-5 anterior lumbar interbody fusion with allograft and instrumentation.   APPENDECTOMY     BACK SURGERY  08/28/2014 and 08/29/2014   lumbar fusion   CARPAL TUNNEL RELEASE Right    release done twice   CHOLECYSTECTOMY N/A 08/12/2021   Procedure: LAPAROSCOPIC CHOLECYSTECTOMY;  Surgeon: Jesusita Oka, MD;  Location: [place];  Service: General;  Laterality: N/A;   COLONOSCOPY     JOINT REPLACEMENT     left knee replacement     lower intestine removal     REPLACEMENT TOTAL KNEE  2010   rigth knee   SHOULDER ARTHROSCOPY Right 01/18/2018   Procedure: RIGHT SHOULDER ARTHROSCOPY, SUBACROMIAL DECOMPRESSION, DISTAL CLAVICLE RESECTION WITH MINI OPEN ROTATOR CUFF REPAIR;  Surgeon: Garald Balding, MD;  Location: [place];  Service: Orthopedics;  Laterality: Right;   TONSILLECTOMY     TOTAL HIP ARTHROPLASTY Right 06/11/2021   Procedure: TOTAL HIP ARTHROPLASTY ANTERIOR APPROACH;  Surgeon: [name], MD;  Location: [facility];  Service: Orthopedics;  Laterality: Right;   TOTAL KNEE ARTHROPLASTY Left 02/15/2015   Procedure: TOTAL KNEE ARTHROPLASTY;  Surgeon: [name], MD;  Location: [place];  Service: Orthopedics;  Laterality: Left;    There were  no vitals filed for this visit.    Subjective Assessment - 01/14/22 0848     Subjective Per pt report, since THA in 05/2021, balance has not been great.  She had gotten to a cane in the past and then had to get back to a walker due to pain and then following THA from balance issues.    Pertinent History THA 05/2021, CVA 2018 (mild L hemi), gallbladder 08/12/21    How long can you walk comfortably? 20-30 mins    Patient Stated Goals "I want to get off the walker, just basically get around better"    Currently in Pain? Yes    Pain Score 3     Pain Location Shoulder    Pain Orientation Right    Pain Descriptors / Indicators Sharp;Throbbing    Pain Type Acute pain    Pain Radiating Towards down into hand    Pain Onset More than a month ago    Pain  Frequency Constant    Aggravating Factors  using arm, sleeping on it    Pain Relieving Factors voltaren gel, taking BC, heat, other medications                [facility] PT Assessment - 01/14/22 0857       Assessment   Medical Diagnosis unsteadiness    Referring Provider (PT) Lucianne Lei, MD    Onset Date/Surgical Date --   approx 2021   Hand Dominance Right    Prior Therapy previous OPPT here at neuro in 2018 for CVA      Precautions   Precautions Fall      Balance Screen   Has the patient fallen in the past 6 months Yes    How many times? 3    Has the patient had a decrease in activity level because of a fear of falling?  No    Is the patient reluctant to leave their home because of a fear of falling?  Yes      [place] residence    Living Arrangements Alone    Type of [place] to enter    Entrance Stairs-Number of Steps 14   in front, 7 step   Entrance Stairs-Rails Right;Left;Cannot reach both   R rail where 7 stairs are   Home Layout One level    [place] - 2 wheels;Shower seat;Cane - single point   tub/shower     Prior Function   Level of Independence Independent    Vocation Retired    Leisure go out to eat, Ship broker   Overall Cognitive Status Within Functional Limits for tasks assessed      Sensation   Light Touch Appears Intact   reports numbness around R hip incision   Hot/Cold Appears Intact      Coordination   Gross Motor Movements are Fluid and Coordinated Yes   only weakness in BLEs noted   Fine Motor Movements are Fluid and Coordinated Yes    Heel Shin Test Grossly WFL, just weakness noted      ROM / Strength   AROM / PROM / Strength Strength      Strength   Overall Strength Deficits    Overall Strength Comments R hip flex 3+/5, L hip flex 3/5, R knee ext 4/5, L knee ext 4/5, R knee flex 4/5, L knee flex 4/5, B ankle DF 4/5  Transfers   Transfers Sit to  Stand;Stand to Sit    Sit to Stand 6: Modified independent (Device/Increase time)    Five time sit to stand comments  12.13 secs without UE support, but using legs against chair and mild posterior LOB.    Stand to Sit 6: Modified independent (Device/Increase time)      Ambulation/Gait   Ambulation/Gait Yes    Ambulation/Gait Assistance 6: Modified independent (Device/Increase time);4: Min guard;5: Supervision;4: Min assist    Ambulation/Gait Assistance Details Pt at a mod I level with RW during session, uses it very lightly during all gait.  PT did have her ambulate without device for DGI assessment in which she is close S for normal gait, but min/guard to min A for higher level gait/balance tasks.    Ambulation Distance (Feet) 300 Feet   with and without RW   Assistive device Rolling walker;None    Gait Pattern Step-through pattern;Decreased stride length;Decreased weight shift to right;Antalgic;Lateral hip instability;Trunk flexed    Ambulation Surface Level;Indoor    Gait velocity 2.84 ft/sec with RW    Stairs Yes    Stairs Assistance 5: Supervision    Stairs Assistance Details (indicate cue type and reason) Pt able to ascend in reciprocal pattern, but descends in step to pattern.    Stair Management Technique Two rails;Alternating pattern;Step to pattern;Forwards    Number of Stairs 4    Height of Stairs 6      Standardized Balance Assessment   Standardized Balance Assessment Dynamic Gait Index      Dynamic Gait Index   Level Surface Mild Impairment    Change in Gait Speed Mild Impairment    Gait with Horizontal Head Turns Moderate Impairment    Gait with Vertical Head Turns Mild Impairment    Gait and Pivot Turn Moderate Impairment    Step Over Obstacle Severe Impairment    Step Around Obstacles Mild Impairment    Steps Moderate Impairment    Total Score 11    DGI comment: Scores of 19 or less are predicitve of falls in older community living adults.                         Objective measurements completed on examination: See above findings.                PT Education - 01/14/22 0943     Education Details Educated on evaluation results, POC, and goals    Person(s) Educated Patient    Methods Explanation    Comprehension Verbalized understanding              PT Short Term Goals - 01/14/22 0953       PT SHORT TERM GOAL #1   Title Pt will be IND with initial HEP in order to indicate improved functional mobility and improved balance (Target Date: 02/13/22)    Time 4    Period Weeks    Status New    Target Date 02/13/22      PT SHORT TERM GOAL #2   Title Pt will improve DGI to >/=15/24 in order to indicate dec fall risk.    Time 4    Period Weeks    Status New      PT SHORT TERM GOAL #3   Title Pt will improve gait speed to >/=3.0 ft/sec with SPC in order to indicate safe community ambulation.    Time 4    Period Weeks  Status New      PT SHORT TERM GOAL #4   Title Pt will negotiate up/down 8 steps with single rail using reciprocal pattern in order to indicate improved strength.    Time 4    Period Weeks    Status New      PT SHORT TERM GOAL #5   Title Pt will perform 5TSS in </=11.5 secs without UE support and without use of LEs on chair/posterior LOB.    Time 4    Period Weeks    Status New               PT Long Term Goals - 01/14/22 5277       PT LONG TERM GOAL #1   Title Pt will be IND with advanced HEP in order to maintain gains made with strength and balance. (Target Date: 03/15/22)    Time 8    Period Weeks    Status New    Target Date 03/15/22      PT LONG TERM GOAL #2   Title Pt will improve DGI to >/=16/24 in order to indicate dec fall risk.    Time 8    Period Weeks    Status New      PT LONG TERM GOAL #3   Title Pt will ambulate x 150' without device over indoor surfaces, scanning environment without LOB at mod I level in order to indicate safety at home.    Time 8     Period Weeks    Status New      PT LONG TERM GOAL #4   Title Pt will ambulate x 500' over unlevel outdoor paved surfaces with SPC at mod I level in order to return to leisure activities.    Time 8    Period Weeks    Status New      PT LONG TERM GOAL #5   Title Pt will negotiate up/down 16 steps with single R rail using reciprocal pattern in order to indicate improved functional strength and endurance.    Time 8    Period Weeks    Status New                    Plan - 01/14/22 0944     Clinical Impression Statement Pt presents with ongoing balance issues, stemming from before, leading up to and after THA in June 2022.  She reports that she has been here before when she had CVA in 2018 and had recovered well from that, using a cane or no device at all but since hip issues, has had to use a walker for safety.  In addition, has history of kidney injury, HTN, spinal sx, and had gall bladder removed in 08/2021.  Upon PT evaluation, note gait speed to be 2.84 ft/sec with RW which is at community ambulatory level, but below normal, 2.35 ft/sec without device indicative of limited community mobility, 12.13 sec 5TSS without UE support but with use of LEs on chair and mild LOB posteriorly, indicative of poor LE strength, and DGI score of 11/24, indicative of high fall risk.  Pt will benefit from skilled OP neuro PT in order to address deficits.    Personal Factors and Comorbidities Comorbidity 3+;Age    Comorbidities see above    Examination-Activity Limitations Bed Mobility;Carry;Locomotion Level;Transfers;Squat;Stairs;Stand    Examination-Participation Restrictions Community Activity;Cleaning;Laundry    Stability/Clinical Decision Making Evolving/Moderate complexity    Clinical Decision Making Moderate    Rehab Potential  Good    PT Frequency 2x / week    PT Duration 8 weeks    PT Treatment/Interventions ADLs/Self Care Home Management;Aquatic Therapy;DME Instruction;Gait training;Stair  training;Functional mobility training;Therapeutic activities;Therapeutic exercise;Balance training;Neuromuscular re-education;Patient/family education;Passive range of motion;Dry needling;Vestibular    PT Next Visit Plan Est HEP for LE strength, esp hip (sit<>stand, stairs, step ups, hip abd, hip flex), endurance, gait with lesser device (SPC as able-she has this at home)    Consulted and Agree with Plan of Care Patient             Patient will benefit from skilled therapeutic intervention in order to improve the following deficits and impairments:  Abnormal gait, Decreased activity tolerance, Decreased balance, Decreased endurance, Decreased mobility, Decreased strength, Difficulty walking, Impaired perceived functional ability, Impaired flexibility, Impaired sensation, Postural dysfunction  Visit Diagnosis: Unsteadiness on feet  Muscle weakness (generalized)  Other abnormalities of gait and mobility  Pain in right hip     Problem List Patient Active Problem List   Diagnosis Date Noted   History of laparoscopic cholecystectomy 08/15/2021   History of cholecystitis 08/15/2021   Status post total hip replacement, right 06/13/2021   Primary osteoarthritis of right hip 06/11/2021   Status post total replacement of right hip 06/11/2021   Complete tear of right rotator cuff 01/18/2018   Impingement syndrome of right shoulder 01/18/2018   AC (acromioclavicular) arthritis 01/18/2018   Rotator cuff tear 01/18/2018   Low oxygen saturation 12/09/2017   Chronic constipation    Failed back syndrome    Chronic pain syndrome    Radicular pain    Chronic bilateral low back pain without sciatica    Dysphagia, post-stroke    Left hemiparesis ([facility])    Acute ischemic stroke ([place]) 12/15/2016   Urinary retention 02/25/2015   Primary osteoarthritis of left knee 02/15/2015   Morbid obesity ([place]) 02/15/2015   Essential hypertension, benign 09/07/2014   Hyperlipidemia 09/05/2014   Gout  09/05/2014   Neuropathy 09/05/2014   Radiculopathy 08/29/2014    Cameron Sprang, PT, MPT [facility] [address] [place], [place], [address] Phone: [phone]   Fax:  [phone] 01/14/22, 10:06 AM   Name: Ellen Henry MRN: [id number] Date of Birth: 1946/03/14

## 2022-01-19 ENCOUNTER — Other Ambulatory Visit: Payer: Self-pay

## 2022-01-19 ENCOUNTER — Encounter: Payer: Self-pay | Admitting: Physical Therapy

## 2022-01-19 ENCOUNTER — Ambulatory Visit: Payer: Medicare Other | Admitting: Physical Therapy

## 2022-01-19 DIAGNOSIS — R2689 Other abnormalities of gait and mobility: Secondary | ICD-10-CM

## 2022-01-19 DIAGNOSIS — M6281 Muscle weakness (generalized): Secondary | ICD-10-CM | POA: Diagnosis not present

## 2022-01-19 DIAGNOSIS — M25551 Pain in right hip: Secondary | ICD-10-CM | POA: Diagnosis not present

## 2022-01-19 DIAGNOSIS — R2681 Unsteadiness on feet: Secondary | ICD-10-CM | POA: Diagnosis not present

## 2022-01-19 NOTE — Patient Instructions (Signed)
Access Code: [email or web address] URL: [email or web address] Date: 01/19/2022 Prepared by: Oneita Kras  Exercises Standing Tandem Balance with Counter Support - 1-2 x daily - 5 x weekly - 2 sets - 2 reps - 30 hold Standing Single Leg Stance with Counter Support - 1-2 x daily - 5 x weekly - 2 sets - 2 reps - 15 hold

## 2022-01-19 NOTE — Therapy (Signed)
[facility] [address] [place], [place], [address] Phone: [phone]   Fax:  [phone]  Physical Therapy Treatment  Patient Details  Name: Ellen Henry MRN: [id number] Date of Birth: March 08, 1946 Referring Provider (PT): Lucianne Lei, MD   Encounter Date: 01/19/2022   PT End of Session - 01/19/22 1101     Visit Number 2    Number of Visits 17    Date for PT Re-Evaluation 03/15/22    Authorization Type UHC Medicare (Progress note needed on visit 10)    Progress Note Due on Visit 10    PT Start Time 1018    PT Stop Time 1100    PT Time Calculation (min) 42 min             Past Medical History:  Diagnosis Date   Acute kidney failure due to procedure    Arthritis    Complication of anesthesia    after nerve block difficult breathing (at [place] [facility] [place] [facility] to [facility])   Dry skin    GERD (gastroesophageal reflux disease)    "years ago", diet controlled   Gout    H/O pyelonephritis    as a child   Heart murmur    since birth, denies any problems    History of sepsis    after spinal surgery   Hypertension    Impingement syndrome of right shoulder    Neuropathy    Osteoarthritis of AC (acromioclavicular) joint    Right   Pneumonia    Radiculopathy    Rotator cuff tear, right    Sleep apnea    does not use cpap   Stroke ([place]) 12/15/2016   left side weakness    Past Surgical History:  Procedure Laterality Date   ABDOMINAL EXPOSURE N/A 08/29/2014   Procedure: ABDOMINAL EXPOSURE;  Surgeon: Rosetta Posner, MD;  Location: [facility];  Service: Vascular;  Laterality: N/A;   ABDOMINAL HYSTERECTOMY     ANTERIOR LUMBAR FUSION N/A 08/29/2014   Procedure: ANTERIOR LUMBAR FUSION 1 LEVEL;  Surgeon: Sinclair Ship, MD;  Location: [facility];  Service: Orthopedics;  Laterality: N/A;  Lumbar 4-5 anterior lumbar interbody fusion with allograft and instrumentation.   APPENDECTOMY     BACK  SURGERY  08/28/2014 and 08/29/2014   lumbar fusion   CARPAL TUNNEL RELEASE Right    release done twice   CHOLECYSTECTOMY N/A 08/12/2021   Procedure: LAPAROSCOPIC CHOLECYSTECTOMY;  Surgeon: Jesusita Oka, MD;  Location: [facility] [facility];  Service: General;  Laterality: N/A;   COLONOSCOPY     JOINT REPLACEMENT     left knee replacement     lower intestine removal     REPLACEMENT TOTAL KNEE  2010   rigth knee   SHOULDER ARTHROSCOPY Right 01/18/2018   Procedure: RIGHT SHOULDER ARTHROSCOPY, SUBACROMIAL DECOMPRESSION, DISTAL CLAVICLE RESECTION WITH MINI OPEN ROTATOR CUFF REPAIR;  Surgeon: Garald Balding, MD;  Location: [facility];  Service: Orthopedics;  Laterality: Right;   TONSILLECTOMY     TOTAL HIP ARTHROPLASTY Right 06/11/2021   Procedure: TOTAL HIP ARTHROPLASTY ANTERIOR APPROACH;  Surgeon: [name], MD;  [facility];  Service: Orthopedics;  Laterality: Right;   TOTAL KNEE ARTHROPLASTY Left 02/15/2015   Procedure: TOTAL KNEE ARTHROPLASTY;  Surgeon: [name], MD;  Location: [facility];  Service: Orthopedics;  Laterality: Left;    There were no vitals filed for this visit.   Subjective Assessment - 01/19/22 1020     Subjective Since  last visit pt has been using her [facility]; has had no falls; went grocery shopping. Plans to make appointment with orthopedic MD about surgery on shoulder. Pt uses cane on R UE but does not think that using cane in making shoulder pain any worse.    Pertinent History THA 05/2021, CVA 2018 (mild L hemi), gallbladder 08/12/21    How long can you walk comfortably? 20-30 mins    Patient Stated Goals "I want to get off the walker, just basically get around better"    Currently in Pain? Yes    Pain Score 3     Pain Location Shoulder    Pain Orientation Right    Pain Descriptors / Indicators Sharp;Throbbing    Pain Type Chronic pain    Pain Onset More than a month ago    Pain Frequency Constant                [facility] PT Assessment - 01/19/22 0001       Standardized  Balance Assessment   Standardized Balance Assessment Timed Up and Go Test;Berg Balance Test      Berg Balance Test   Sit to Stand Able to stand without using hands and stabilize independently    Standing Unsupported Able to stand safely 2 minutes    Sitting with Back Unsupported but Feet Supported on Floor or Stool Able to sit safely and securely 2 minutes    Stand to Sit Sits safely with minimal use of hands    Transfers Able to transfer safely, minor use of hands    Standing Unsupported with Eyes Closed Able to stand 10 seconds safely    Standing Unsupported with Feet Together Able to place feet together independently and stand 1 minute safely    From Standing, Reach Forward with Outstretched Arm Can reach confidently >25 cm (10")    From Standing Position, Pick up Object from Floor Able to pick up shoe safely and easily    From Standing Position, Turn to Look Behind Over each Shoulder Looks behind from both sides and weight shifts well    Turn 360 Degrees Able to turn 360 degrees safely one side only in 4 seconds or less    Standing Unsupported, Alternately Place Feet on Step/Stool Able to complete >2 steps/needs minimal assist    Standing Unsupported, One Foot in Front Able to take small step independently and hold 30 seconds    Standing on One Leg Tries to lift leg/unable to hold 3 seconds but remains standing independently    Total Score 47    Berg comment: Pt uses cane outdoor: 47-49.6 indicates significant to high fall risk      Timed Up and Go Test   TUG Normal TUG    Normal TUG (seconds) 11.43   with SPC   TUG Comments Normal: >13.5 sec indicates high fall risk               Access Code: [email or web address] URL: [email or web address] Date: 01/19/2022 Prepared by: Oneita Kras  Performed exercises below with cues for technique and required intermittent UE support. Exercises Standing Tandem Balance with Counter Support - 1-2 x daily - 5 x weekly - 2 sets - 2 reps - 30  hold Standing Single Leg Stance with Counter Support - 1-2 x daily - 5 x weekly - 2 sets - 2 reps - 15 hold                      PT  Education - 01/19/22 1339     Education Details Discussed results of the Berg and TUG and recommend pt use SPC in the community and SPC/Walker as needed if she has a change in mobility status.    Person(s) Educated Patient    Methods Explanation;Handout    Comprehension Verbalized understanding              PT Short Term Goals - 01/14/22 0953       PT SHORT TERM GOAL #1   Title Pt will be IND with initial HEP in order to indicate improved functional mobility and improved balance (Target Date: 02/13/22)    Time 4    Period Weeks    Status New    Target Date 02/13/22      PT SHORT TERM GOAL #2   Title Pt will improve DGI to >/=15/24 in order to indicate dec fall risk.    Time 4    Period Weeks    Status New      PT SHORT TERM GOAL #3   Title Pt will improve gait speed to >/=3.0 ft/sec with SPC in order to indicate safe community ambulation.    Time 4    Period Weeks    Status New      PT SHORT TERM GOAL #4   Title Pt will negotiate up/down 8 steps with single rail using reciprocal pattern in order to indicate improved strength.    Time 4    Period Weeks    Status New      PT SHORT TERM GOAL #5   Title Pt will perform 5TSS in </=11.5 secs without UE support and without use of LEs on chair/posterior LOB.    Time 4    Period Weeks    Status New               PT Long Term Goals - 01/14/22 6578       PT LONG TERM GOAL #1   Title Pt will be IND with advanced HEP in order to maintain gains made with strength and balance. (Target Date: 03/15/22)    Time 8    Period Weeks    Status New    Target Date 03/15/22      PT LONG TERM GOAL #2   Title Pt will improve DGI to >/=16/24 in order to indicate dec fall risk.    Time 8    Period Weeks    Status New      PT LONG TERM GOAL #3   Title Pt will ambulate x 150'  without device over indoor surfaces, scanning environment without LOB at mod I level in order to indicate safety at home.    Time 8    Period Weeks    Status New      PT LONG TERM GOAL #4   Title Pt will ambulate x 500' over unlevel outdoor paved surfaces with SPC at mod I level in order to return to leisure activities.    Time 8    Period Weeks    Status New      PT LONG TERM GOAL #5   Title Pt will negotiate up/down 16 steps with single R rail using reciprocal pattern in order to indicate improved functional strength and endurance.    Time 8    Period Weeks    Status New                   Plan - 01/19/22  65     Clinical Impression Statement Pt came into therapy with [facility] which she reports using at home and in the community since last PT visit, and pt reports no falls.  Pt scored on the Berg 47/56 ( test recommendation with this score is using cane during community ambulation) and scored on the TUG 11.43 sec with using SPC.  Progressed treatment with balance HEP; pt required intermittent UE support for standing balance with narrow BOS.    Personal Factors and Comorbidities Comorbidity 3+;Age    Comorbidities see above    Examination-Activity Limitations Bed Mobility;Carry;Locomotion Level;Transfers;Squat;Stairs;Stand    Examination-Participation Restrictions Community Activity;Cleaning;Laundry    Stability/Clinical Decision Making Evolving/Moderate complexity    Rehab Potential Good    PT Frequency 2x / week    PT Duration 8 weeks    PT Treatment/Interventions ADLs/Self Care Home Management;Aquatic Therapy;DME Instruction;Gait training;Stair training;Functional mobility training;Therapeutic activities;Therapeutic exercise;Balance training;Neuromuscular re-education;Patient/family education;Passive range of motion;Dry needling;Vestibular    PT Next Visit Plan check and add to HEP for LE strength, esp hip (sit<>stand, stairs, step ups, hip abd, hip flex), endurance, gait with  lesser device (SPC as able-she has this at home)    Consulted and Agree with Plan of Care Patient             Patient will benefit from skilled therapeutic intervention in order to improve the following deficits and impairments:  Abnormal gait, Decreased activity tolerance, Decreased balance, Decreased endurance, Decreased mobility, Decreased strength, Difficulty walking, Impaired perceived functional ability, Impaired flexibility, Impaired sensation, Postural dysfunction  Visit Diagnosis: Unsteadiness on feet  Muscle weakness (generalized)  Other abnormalities of gait and mobility     Problem List Patient Active Problem List   Diagnosis Date Noted   History of laparoscopic cholecystectomy 08/15/2021   History of cholecystitis 08/15/2021   Status post total hip replacement, right 06/13/2021   Primary osteoarthritis of right hip 06/11/2021   Status post total replacement of right hip 06/11/2021   Complete tear of right rotator cuff 01/18/2018   Impingement syndrome of right shoulder 01/18/2018   AC (acromioclavicular) arthritis 01/18/2018   Rotator cuff tear 01/18/2018   Low oxygen saturation 12/09/2017   Chronic constipation    Failed back syndrome    Chronic pain syndrome    Radicular pain    Chronic bilateral low back pain without sciatica    Dysphagia, post-stroke    Left hemiparesis ([facility])    Acute ischemic stroke ([place]) 12/15/2016   Urinary retention 02/25/2015   Primary osteoarthritis of left knee 02/15/2015   Morbid obesity ([place]) 02/15/2015   Essential hypertension, benign 09/07/2014   Hyperlipidemia 09/05/2014   Gout 09/05/2014   Neuropathy 09/05/2014   Radiculopathy 08/29/2014    Bjorn Loser, PTA  01/19/22, 1:47 PM   [facility] [address] [place], [place], [address] Phone: [phone]   Fax:  [phone]  Name: KLOIE WHITING MRN: [id number] Date of Birth: 01/29/1946

## 2022-01-21 ENCOUNTER — Ambulatory Visit: Payer: Medicare Other | Admitting: Physical Therapy

## 2022-01-21 ENCOUNTER — Other Ambulatory Visit: Payer: Self-pay

## 2022-01-21 ENCOUNTER — Encounter: Payer: Self-pay | Admitting: Physical Therapy

## 2022-01-21 DIAGNOSIS — R2689 Other abnormalities of gait and mobility: Secondary | ICD-10-CM

## 2022-01-21 DIAGNOSIS — M25551 Pain in right hip: Secondary | ICD-10-CM | POA: Diagnosis not present

## 2022-01-21 DIAGNOSIS — R2681 Unsteadiness on feet: Secondary | ICD-10-CM

## 2022-01-21 DIAGNOSIS — M6281 Muscle weakness (generalized): Secondary | ICD-10-CM | POA: Diagnosis not present

## 2022-01-21 NOTE — Patient Instructions (Signed)
Access Code: [email or web address] URL: [email or web address] Date: 01/21/2022 Prepared by: Willow Ora  Exercises Sit to Stand with Counter Support - 1 x daily - 5 x weekly - 1 sets - 10 reps Standing Hip Abduction with Resistance at Ankles and Counter Support - 1 x daily - 5 x weekly - 1 sets - 10 reps Standing Hip Extension with Resistance at Ankles and Counter Support - 1 x daily - 5 x weekly - 1 sets - 10 reps Standing Tandem Balance with Counter Support - 1 x daily - 5 x weekly - 1 sets - 2 reps - 30 hold Standing Single Leg Stance with Counter Support - 1 x daily - 5 x weekly - 1 sets - 2 reps - 15 hold Romberg Stance with Eyes Closed - 1 x daily - 5 x weekly - 1 sets - 10 reps

## 2022-01-21 NOTE — Therapy (Signed)
[facility] [address] [place], [place], [address] Phone: [phone]   Fax:  [phone]  Physical Therapy Treatment  Patient Details  Name: Ellen Henry MRN: [id number] Date of Birth: May 15, 1946 Referring Provider (PT): Lucianne Lei, MD   Encounter Date: 01/21/2022   PT End of Session - 01/21/22 0936     Visit Number 3    Number of Visits 17    Date for PT Re-Evaluation 03/15/22    Authorization Type UHC Medicare (Progress note needed on visit 10)    Progress Note Due on Visit 10    PT Start Time 0933    PT Stop Time 1015    PT Time Calculation (min) 42 min    Equipment Utilized During Treatment Gait belt    Activity Tolerance Patient tolerated treatment well    Behavior During Therapy WFL for tasks assessed/performed             Past Medical History:  Diagnosis Date   Acute kidney failure due to procedure    Arthritis    Complication of anesthesia    after nerve block difficult breathing (at [place] [facility] [place] transported to [facility])   Dry skin    GERD (gastroesophageal reflux disease)    "years ago", diet controlled   Gout    H/O pyelonephritis    as a child   Heart murmur    since birth, denies any problems    History of sepsis    after spinal surgery   Hypertension    Impingement syndrome of right shoulder    Neuropathy    Osteoarthritis of AC (acromioclavicular) joint    Right   Pneumonia    Radiculopathy    Rotator cuff tear, right    Sleep apnea    does not use cpap   Stroke ([place]) 12/15/2016   left side weakness    Past Surgical History:  Procedure Laterality Date   ABDOMINAL EXPOSURE N/A 08/29/2014   Procedure: ABDOMINAL EXPOSURE;  Surgeon: Rosetta Posner, MD;  Location: [facility];  Service: Vascular;  Laterality: N/A;   ABDOMINAL HYSTERECTOMY     ANTERIOR LUMBAR FUSION N/A 08/29/2014   Procedure: ANTERIOR LUMBAR FUSION 1 LEVEL;  Surgeon: Sinclair Ship, MD;   Location: [facility];  Service: Orthopedics;  Laterality: N/A;  Lumbar 4-5 anterior lumbar interbody fusion with allograft and instrumentation.   APPENDECTOMY     BACK SURGERY  08/28/2014 and 08/29/2014   lumbar fusion   CARPAL TUNNEL RELEASE Right    release done twice   CHOLECYSTECTOMY N/A 08/12/2021   Procedure: LAPAROSCOPIC CHOLECYSTECTOMY;  Surgeon: Jesusita Oka, MD;  Location: [facility];  Service: General;  Laterality: N/A;   COLONOSCOPY     JOINT REPLACEMENT     left knee replacement     lower intestine removal     REPLACEMENT TOTAL KNEE  2010   rigth knee   SHOULDER ARTHROSCOPY Right 01/18/2018   Procedure: RIGHT SHOULDER ARTHROSCOPY, SUBACROMIAL DECOMPRESSION, DISTAL CLAVICLE RESECTION WITH MINI OPEN ROTATOR CUFF REPAIR;  Surgeon: Garald Balding, MD;  Location: [facility];  Service: Orthopedics;  Laterality: Right;   TONSILLECTOMY     TOTAL HIP ARTHROPLASTY Right 06/11/2021   Procedure: TOTAL HIP ARTHROPLASTY ANTERIOR APPROACH;  Surgeon: [name], MD;  [facility];  Service: Orthopedics;  Laterality: Right;   TOTAL KNEE ARTHROPLASTY Left 02/15/2015   Procedure: TOTAL KNEE ARTHROPLASTY;  Surgeon: [name], MD;  Location: [facility];  Service:  Orthopedics;  Laterality: Left;    There were no vitals filed for this visit.   Subjective Assessment - 01/21/22 0934     Subjective No new complaints. No falls. Some right arm/shoulder discomfort, planning to make an appt with her orthopedic MD.    Pertinent History THA 05/2021, CVA 2018 (mild L hemi), gallbladder 08/12/21    How long can you walk comfortably? 20-30 mins    Patient Stated Goals "I want to get off the walker, just basically get around better"    Currently in Pain? Yes    Pain Score 5     Pain Location Shoulder    Pain Orientation Right    Pain Descriptors / Indicators Sharp;Throbbing    Pain Type Chronic pain    Pain Radiating Towards down arm into hand    Pain Onset More than a month ago    Pain Frequency Constant     Aggravating Factors  using arm, sleeping on it    Pain Relieving Factors Voltaren gel/Aspercream, BC powders, heat                     [facility] Adult PT Treatment/Exercise - 01/21/22 0942       Transfers   Transfers Sit to Stand;Stand to Sit    Sit to Stand 6: Modified independent (Device/Increase time)    Stand to Sit 6: Modified independent (Device/Increase time)      Ambulation/Gait   Ambulation/Gait Yes    Ambulation/Gait Assistance 6: Modified independent (Device/Increase time)    Ambulation/Gait Assistance Details Mod I with RW    Assistive device Rolling walker    Gait Pattern Step-through pattern;Decreased stride length;Decreased weight shift to right;Antalgic;Lateral hip instability;Trunk flexed    Ambulation Surface Level;Indoor      Exercises   Exercises Other Exercises    Other Exercises  reviewed and added ex's to HEP for strengthening and balance. Refer to [place] for full details. Min guard assist for safety with balance ex's. Cues on correct form and technique.      Knee/Hip Exercises: Aerobic   Other Aerobic Scifit UE/LE's level 3.5 x 6 minutes with goal >/= 95 steps per minute for strengthening and activity tolerance.             Issued to HEP today: Access Code: [email or web address] URL: [email or web address] Date: 01/21/2022 Prepared by: Willow Ora  Exercises Sit to Stand with Counter Support - 1 x daily - 5 x weekly - 1 sets - 10 reps Standing Hip Abduction with Resistance at Ankles and Counter Support - 1 x daily - 5 x weekly - 1 sets - 10 reps Standing Hip Extension with Resistance at Ankles and Counter Support - 1 x daily - 5 x weekly - 1 sets - 10 reps Standing Tandem Balance with Counter Support - 1 x daily - 5 x weekly - 1 sets - 2 reps - 30 hold Standing Single Leg Stance with Counter Support - 1 x daily - 5 x weekly - 1 sets - 2 reps - 15 hold Romberg Stance with Eyes Closed - 1 x daily - 5 x weekly - 1 sets - 10 reps         PT Education - 01/21/22 1003     Education Details reviewed and added to HEP    Person(s) Educated Patient    Methods Explanation;Demonstration;Verbal cues;Handout    Comprehension Verbalized understanding;Returned demonstration;Verbal cues required;Need further instruction  PT Short Term Goals - 01/14/22 0953       PT SHORT TERM GOAL #1   Title Pt will be IND with initial HEP in order to indicate improved functional mobility and improved balance (Target Date: 02/13/22)    Time 4    Period Weeks    Status New    Target Date 02/13/22      PT SHORT TERM GOAL #2   Title Pt will improve DGI to >/=15/24 in order to indicate dec fall risk.    Time 4    Period Weeks    Status New      PT SHORT TERM GOAL #3   Title Pt will improve gait speed to >/=3.0 ft/sec with SPC in order to indicate safe community ambulation.    Time 4    Period Weeks    Status New      PT SHORT TERM GOAL #4   Title Pt will negotiate up/down 8 steps with single rail using reciprocal pattern in order to indicate improved strength.    Time 4    Period Weeks    Status New      PT SHORT TERM GOAL #5   Title Pt will perform 5TSS in </=11.5 secs without UE support and without use of LEs on chair/posterior LOB.    Time 4    Period Weeks    Status New               PT Long Term Goals - 01/14/22 0086       PT LONG TERM GOAL #1   Title Pt will be IND with advanced HEP in order to maintain gains made with strength and balance. (Target Date: 03/15/22)    Time 8    Period Weeks    Status New    Target Date 03/15/22      PT LONG TERM GOAL #2   Title Pt will improve DGI to >/=16/24 in order to indicate dec fall risk.    Time 8    Period Weeks    Status New      PT LONG TERM GOAL #3   Title Pt will ambulate x 150' without device over indoor surfaces, scanning environment without LOB at mod I level in order to indicate safety at home.    Time 8    Period Weeks    Status New      PT  LONG TERM GOAL #4   Title Pt will ambulate x 500' over unlevel outdoor paved surfaces with SPC at mod I level in order to return to leisure activities.    Time 8    Period Weeks    Status New      PT LONG TERM GOAL #5   Title Pt will negotiate up/down 16 steps with single R rail using reciprocal pattern in order to indicate improved functional strength and endurance.    Time 8    Period Weeks    Status New                   Plan - 01/21/22 7619     Clinical Impression Statement Today's skilled session focused on review and additon of ex's to HEP for strengthening and balance. No issues noted or reported in session. The pt is making steady progress toward goals and should benefit from continued PT to progress toward unmet goals.    Personal Factors and Comorbidities Comorbidity 3+;Age    Comorbidities see above  Examination-Activity Limitations Bed Mobility;Carry;Locomotion Level;Transfers;Squat;Stairs;Stand    Examination-Participation Restrictions Community Activity;Cleaning;Laundry    Stability/Clinical Decision Making Evolving/Moderate complexity    Rehab Potential Good    PT Frequency 2x / week    PT Duration 8 weeks    PT Treatment/Interventions ADLs/Self Care Home Management;Aquatic Therapy;DME Instruction;Gait training;Stair training;Functional mobility training;Therapeutic activities;Therapeutic exercise;Balance training;Neuromuscular re-education;Patient/family education;Passive range of motion;Dry needling;Vestibular    PT Next Visit Plan continued to work on activity tolerance/strengthening, standing balance progressing to compliant surfaces as able and gait training with straight cane    PT Home Exercise Plan Access Code: [email or web address]    Consulted and Agree with Plan of Care Patient             Patient will benefit from skilled therapeutic intervention in order to improve the following deficits and impairments:  Abnormal gait, Decreased activity tolerance,  Decreased balance, Decreased endurance, Decreased mobility, Decreased strength, Difficulty walking, Impaired perceived functional ability, Impaired flexibility, Impaired sensation, Postural dysfunction  Visit Diagnosis: Unsteadiness on feet  Muscle weakness (generalized)  Other abnormalities of gait and mobility     Problem List Patient Active Problem List   Diagnosis Date Noted   History of laparoscopic cholecystectomy 08/15/2021   History of cholecystitis 08/15/2021   Status post total hip replacement, right 06/13/2021   Primary osteoarthritis of right hip 06/11/2021   Status post total replacement of right hip 06/11/2021   Complete tear of right rotator cuff 01/18/2018   Impingement syndrome of right shoulder 01/18/2018   AC (acromioclavicular) arthritis 01/18/2018   Rotator cuff tear 01/18/2018   Low oxygen saturation 12/09/2017   Chronic constipation    Failed back syndrome    Chronic pain syndrome    Radicular pain    Chronic bilateral low back pain without sciatica    Dysphagia, post-stroke    Left hemiparesis (HCC)    Acute ischemic stroke ([place]) 12/15/2016   Urinary retention 02/25/2015   Primary osteoarthritis of left knee 02/15/2015   Morbid obesity ([place]) 02/15/2015   Essential hypertension, benign 09/07/2014   Hyperlipidemia 09/05/2014   Gout 09/05/2014   Neuropathy 09/05/2014   Radiculopathy 08/29/2014    Willow Ora, PTA, [facility] [address] [place], [place] [address] [phone] 01/21/22, 10:26 AM   Name: Ellen Henry MRN: [id number] Date of Birth: December 26, 1945

## 2022-01-26 ENCOUNTER — Other Ambulatory Visit: Payer: Self-pay

## 2022-01-26 ENCOUNTER — Ambulatory Visit: Payer: Medicare Other | Admitting: Physical Therapy

## 2022-01-26 ENCOUNTER — Encounter: Payer: Self-pay | Admitting: Physical Therapy

## 2022-01-26 DIAGNOSIS — R2689 Other abnormalities of gait and mobility: Secondary | ICD-10-CM | POA: Diagnosis not present

## 2022-01-26 DIAGNOSIS — R2681 Unsteadiness on feet: Secondary | ICD-10-CM | POA: Diagnosis not present

## 2022-01-26 DIAGNOSIS — M6281 Muscle weakness (generalized): Secondary | ICD-10-CM | POA: Diagnosis not present

## 2022-01-26 DIAGNOSIS — M25551 Pain in right hip: Secondary | ICD-10-CM | POA: Diagnosis not present

## 2022-01-26 NOTE — Therapy (Signed)
[facility] [address] [place], [place], [address] Phone: [phone]   Fax:  [phone]  Physical Therapy Treatment  Patient Details  Name: Ellen Henry MRN: [id number] Date of Birth: 12-29-1945 Referring Provider (PT): Lucianne Lei, MD   Encounter Date: 01/26/2022   PT End of Session - 01/26/22 1041     Visit Number 4    Number of Visits 17    Date for PT Re-Evaluation 03/15/22    Authorization Type U[employer] Medicare (Progress note needed on visit 10)    Progress Note Due on Visit 10    PT Start Time 0933    PT Stop Time 1020    PT Time Calculation (min) 47 min    Equipment Utilized During Treatment Gait belt    Activity Tolerance Patient tolerated treatment well    Behavior During Therapy WFL for tasks assessed/performed             Past Medical History:  Diagnosis Date   Acute kidney failure due to procedure    Arthritis    Complication of anesthesia    after nerve block difficult breathing (at [facility] [facility] [address] transported to [facility])   Dry skin    GERD (gastroesophageal reflux disease)    "years ago", diet controlled   Gout    H/O pyelonephritis    as a child   Heart murmur    since birth, denies any problems    History of sepsis    after spinal surgery   Hypertension    Impingement syndrome of right shoulder    Neuropathy    Osteoarthritis of AC (acromioclavicular) joint    Right   Pneumonia    Radiculopathy    Rotator cuff tear, right    Sleep apnea    does not use cpap   Stroke ([place]) 12/15/2016   left side weakness    Past Surgical History:  Procedure Laterality Date   ABDOMINAL EXPOSURE N/A 08/29/2014   Procedure: ABDOMINAL EXPOSURE;  Surgeon: Rosetta Posner, MD;  Location: [facility];  Service: Vascular;  Laterality: N/A;   ABDOMINAL HYSTERECTOMY     ANTERIOR LUMBAR FUSION N/A 08/29/2014   Procedure: ANTERIOR LUMBAR FUSION 1 LEVEL;  Surgeon: Sinclair Ship, MD;   Location: [facility];  Service: Orthopedics;  Laterality: N/A;  Lumbar 4-5 anterior lumbar interbody fusion with allograft and instrumentation.   APPENDECTOMY     BACK SURGERY  08/28/2014 and 08/29/2014   lumbar fusion   CARPAL TUNNEL RELEASE Right    release done twice   CHOLECYSTECTOMY N/A 08/12/2021   Procedure: LAPAROSCOPIC CHOLECYSTECTOMY;  Surgeon: Jesusita Oka, MD;  Location: [facility];  Service: General;  Laterality: N/A;   COLONOSCOPY     JOINT REPLACEMENT     left knee replacement     lower intestine removal     REPLACEMENT TOTAL KNEE  2010   rigth knee   SHOULDER ARTHROSCOPY Right 01/18/2018   Procedure: RIGHT SHOULDER ARTHROSCOPY, SUBACROMIAL DECOMPRESSION, DISTAL CLAVICLE RESECTION WITH MINI OPEN ROTATOR CUFF REPAIR;  Surgeon: Garald Balding, MD;  Location: [facility];  Service: Orthopedics;  Laterality: Right;   TONSILLECTOMY     TOTAL HIP ARTHROPLASTY Right 06/11/2021   Procedure: TOTAL HIP ARTHROPLASTY ANTERIOR APPROACH;  Surgeon: [name], MD;  Location[facility];  Service: Orthopedics;  Laterality: Right;   TOTAL KNEE ARTHROPLASTY Left 02/15/2015   Procedure: TOTAL KNEE ARTHROPLASTY;  Surgeon: [name], MD;  Location: [facility];  Service:  Orthopedics;  Laterality: Left;    There were no vitals filed for this visit.   Subjective Assessment - 01/26/22 0934     Subjective Pt reports no falls. Pt is working on [employer].    Pertinent History THA 05/2021, CVA 2018 (mild L hemi), gallbladder 08/12/21    How long can you walk comfortably? 20-30 mins    Patient Stated Goals "I want to get off the walker, just basically get around better"    Currently in Pain? No/denies    Pain Score 3     Pain Location Shoulder    Pain Orientation Right    Pain Descriptors / Indicators Throbbing    Pain Type Chronic pain    Pain Onset More than a month ago    Pain Frequency Intermittent    Multiple Pain Sites Yes    Pain Score 3    Pain Location Back    Pain Orientation Lower    Pain Descriptors /  Indicators Aching    Pain Type Chronic pain    Pain Onset More than a month ago    Pain Frequency Intermittent    Aggravating Factors  standing for a long time.    Pain Relieving Factors losing weight.                               [facility] Adult PT Treatment/Exercise - 01/26/22 0001       Ambulation/Gait   Ambulation/Gait Yes    Ambulation/Gait Assistance 5: Supervision    Ambulation/Gait Assistance Details training with [facility] working on uneven community surfaces, paved and not paved, walking to the front entrance of rehab, min cues for cane placement.    Ambulation Distance (Feet) 500 Feet    Assistive device Straight cane    Gait Pattern Step-through pattern;Decreased stride length;Decreased weight shift to right;Antalgic;Lateral hip instability;Trunk flexed    Ambulation Surface Unlevel;Level;Outdoor;Indoor;Gravel;Paved    Ramp 5: Supervision    Ramp Details (indicate cue type and reason) multiple reps working on endurance and stability.    Curb 5: Supervision    Curb Details (indicate cue type and reason) cues for sequence. pt seems more stable when stepping with Left LE first then bringing [place].      Exercises   Exercises Lumbar;Knee/Hip   1. Posterior pelvic tilts: in supine, sitting, and standing to address low back pain.cues for body mechanics and activating abdominals. 2. hooklying marching, LLE x10 then alt LEs x10                    PT Education - 01/26/22 1038     Education Details Added posteroir pelvic tilts to HEP for low back stretch and abdominal strengthing.  Educated pt on meaning of paresis; pt was confused and did not think her LLE could get stronger. Explain LLE strengthening and POC.    Person(s) Educated Patient    Methods Explanation;Handout    Comprehension Verbalized understanding              PT Short Term Goals - 01/14/22 0953       PT SHORT TERM GOAL #1   Title Pt will be IND with initial HEP in order to indicate  improved functional mobility and improved balance (Target Date: 02/13/22)    Time 4    Period Weeks    Status New    Target Date 02/13/22      PT SHORT TERM GOAL #2  Title Pt will improve DGI to >/=15/24 in order to indicate dec fall risk.    Time 4    Period Weeks    Status New      PT SHORT TERM GOAL #3   Title Pt will improve gait speed to >/=3.0 ft/sec with SPC in order to indicate safe community ambulation.    Time 4    Period Weeks    Status New      PT SHORT TERM GOAL #4   Title Pt will negotiate up/down 8 steps with single rail using reciprocal pattern in order to indicate improved strength.    Time 4    Period Weeks    Status New      PT SHORT TERM GOAL #5   Title Pt will perform 5TSS in </=11.5 secs without UE support and without use of LEs on chair/posterior LOB.    Time 4    Period Weeks    Status New               PT Long Term Goals - 01/14/22 1610       PT LONG TERM GOAL #1   Title Pt will be IND with advanced HEP in order to maintain gains made with strength and balance. (Target Date: 03/15/22)    Time 8    Period Weeks    Status New    Target Date 03/15/22      PT LONG TERM GOAL #2   Title Pt will improve DGI to >/=16/24 in order to indicate dec fall risk.    Time 8    Period Weeks    Status New      PT LONG TERM GOAL #3   Title Pt will ambulate x 150' without device over indoor surfaces, scanning environment without LOB at mod I level in order to indicate safety at home.    Time 8    Period Weeks    Status New      PT LONG TERM GOAL #4   Title Pt will ambulate x 500' over unlevel outdoor paved surfaces with SPC at mod I level in order to return to leisure activities.    Time 8    Period Weeks    Status New      PT LONG TERM GOAL #5   Title Pt will negotiate up/down 16 steps with single R rail using reciprocal pattern in order to indicate improved functional strength and endurance.    Time 8    Period Weeks    Status New                    Plan - 01/26/22 1045     Clinical Impression Statement Pt is making progress with community gait with SPC demonstrating ability to navigate community obstacles at supervision level. Pt reports understanding and compliance with HEP at home.    Personal Factors and Comorbidities Comorbidity 3+;Age    Comorbidities see above    Examination-Activity Limitations Bed Mobility;Carry;Locomotion Level;Transfers;Squat;Stairs;Stand    Examination-Participation Restrictions Community Activity;Cleaning;Laundry    Stability/Clinical Decision Making Evolving/Moderate complexity    Rehab Potential Good    PT Frequency 2x / week    PT Duration 8 weeks    PT Treatment/Interventions ADLs/Self Care Home Management;Aquatic Therapy;DME Instruction;Gait training;Stair training;Functional mobility training;Therapeutic activities;Therapeutic exercise;Balance training;Neuromuscular re-education;Patient/family education;Passive range of motion;Dry needling;Vestibular    PT Next Visit Plan continued to work on activity tolerance/ LLEstrengthening, floor transfers (pt is concerned about getting up from  floor from previous falls), standing balance progressing to compliant surfaces as able and gait training with straight cane    PT Home Exercise Plan Access Code: [email or web address]    Consulted and Agree with Plan of Care Patient             Patient will benefit from skilled therapeutic intervention in order to improve the following deficits and impairments:  Abnormal gait, Decreased activity tolerance, Decreased balance, Decreased endurance, Decreased mobility, Decreased strength, Difficulty walking, Impaired perceived functional ability, Impaired flexibility, Impaired sensation, Postural dysfunction  Visit Diagnosis: Unsteadiness on feet  Muscle weakness (generalized)  Other abnormalities of gait and mobility     Problem List Patient Active Problem List   Diagnosis Date Noted   History of laparoscopic  cholecystectomy 08/15/2021   History of cholecystitis 08/15/2021   Status post total hip replacement, right 06/13/2021   Primary osteoarthritis of right hip 06/11/2021   Status post total replacement of right hip 06/11/2021   Complete tear of right rotator cuff 01/18/2018   Impingement syndrome of right shoulder 01/18/2018   AC (acromioclavicular) arthritis 01/18/2018   Rotator cuff tear 01/18/2018   Low oxygen saturation 12/09/2017   Chronic constipation    Failed back syndrome    Chronic pain syndrome    Radicular pain    Chronic bilateral low back pain without sciatica    Dysphagia, post-stroke    Left hemiparesis (HCC)    Acute ischemic stroke ([place]) 12/15/2016   Urinary retention 02/25/2015   Primary osteoarthritis of left knee 02/15/2015   Morbid obesity ([place]) 02/15/2015   Essential hypertension, benign 09/07/2014   Hyperlipidemia 09/05/2014   Gout 09/05/2014   Neuropathy 09/05/2014   Radiculopathy 08/29/2014    Bjorn Loser, PTA  01/26/22, 10:52 AM   [facility] [address] [place], [place], [address] Phone: [phone]   Fax:  [phone]  Name: Ellen Henry MRN: [id number] Date of Birth: 11-06-1946

## 2022-01-26 NOTE — Patient Instructions (Signed)
Posterior pelvic tilts -- Can do lying down, sitting or standing to stretch low back and strengthen abdominals.   Hemi- paresis: means weakness on one side of the body;  (NOT PARALYSIS which means NO movement)

## 2022-01-27 DIAGNOSIS — Z1231 Encounter for screening mammogram for malignant neoplasm of breast: Secondary | ICD-10-CM | POA: Diagnosis not present

## 2022-01-29 ENCOUNTER — Other Ambulatory Visit: Payer: Self-pay

## 2022-01-29 ENCOUNTER — Ambulatory Visit: Payer: Medicare Other | Admitting: Physical Therapy

## 2022-01-29 ENCOUNTER — Encounter: Payer: Self-pay | Admitting: Physical Therapy

## 2022-01-29 DIAGNOSIS — M25551 Pain in right hip: Secondary | ICD-10-CM | POA: Diagnosis not present

## 2022-01-29 DIAGNOSIS — R2689 Other abnormalities of gait and mobility: Secondary | ICD-10-CM | POA: Diagnosis not present

## 2022-01-29 DIAGNOSIS — M6281 Muscle weakness (generalized): Secondary | ICD-10-CM | POA: Diagnosis not present

## 2022-01-29 DIAGNOSIS — R2681 Unsteadiness on feet: Secondary | ICD-10-CM

## 2022-01-29 NOTE — Therapy (Signed)
[facility] [address] [place], [place], [address] Phone: [phone]   Fax:  [phone]  Physical Therapy Treatment  Patient Details  Name: Ellen Henry MRN: [id number] Date of Birth: Apr 22, 1946 Referring Provider (PT): Lucianne Lei, MD   Encounter Date: 01/29/2022   PT End of Session - 01/29/22 0856     Visit Number 5    Number of Visits 17    Date for PT Re-Evaluation 03/15/22    Authorization Type UHC Medicare (Progress note needed on visit 10)    Progress Note Due on Visit 10    PT Start Time 0850    PT Stop Time 0929    PT Time Calculation (min) 39 min    Equipment Utilized During Treatment Gait belt    Activity Tolerance Patient tolerated treatment well    Behavior During Therapy WFL for tasks assessed/performed             Past Medical History:  Diagnosis Date   Acute kidney failure due to procedure    Arthritis    Complication of anesthesia    after nerve block difficult breathing (at [place] [facility] [address] transported to [facility])   Dry skin    GERD (gastroesophageal reflux disease)    "years ago", diet controlled   Gout    H/O pyelonephritis    as a child   Heart murmur    since birth, denies any problems    History of sepsis    after spinal surgery   Hypertension    Impingement syndrome of right shoulder    Neuropathy    Osteoarthritis of AC (acromioclavicular) joint    Right   Pneumonia    Radiculopathy    Rotator cuff tear, right    Sleep apnea    does not use cpap   Stroke ([facility]) 12/15/2016   left side weakness    Past Surgical History:  Procedure Laterality Date   ABDOMINAL EXPOSURE N/A 08/29/2014   Procedure: ABDOMINAL EXPOSURE;  Surgeon: Rosetta Posner, MD;  Location: [facility];  Service: Vascular;  Laterality: N/A;   ABDOMINAL HYSTERECTOMY     ANTERIOR LUMBAR FUSION N/A 08/29/2014   Procedure: ANTERIOR LUMBAR FUSION 1 LEVEL;  Surgeon: Sinclair Ship, MD;   Location: [facility];  Service: Orthopedics;  Laterality: N/A;  Lumbar 4-5 anterior lumbar interbody fusion with allograft and instrumentation.   APPENDECTOMY     BACK SURGERY  08/28/2014 and 08/29/2014   lumbar fusion   CARPAL TUNNEL RELEASE Right    release done twice   CHOLECYSTECTOMY N/A 08/12/2021   Procedure: LAPAROSCOPIC CHOLECYSTECTOMY;  Surgeon: Jesusita Oka, MD;  Location: [facility];  Service: General;  Laterality: N/A;   COLONOSCOPY     JOINT REPLACEMENT     left knee replacement     lower intestine removal     REPLACEMENT TOTAL KNEE  2010   rigth knee   SHOULDER ARTHROSCOPY Right 01/18/2018   Procedure: RIGHT SHOULDER ARTHROSCOPY, SUBACROMIAL DECOMPRESSION, DISTAL CLAVICLE RESECTION WITH MINI OPEN ROTATOR CUFF REPAIR;  Surgeon: Garald Balding, MD;  Location: [facility];  Service: Orthopedics;  Laterality: Right;   TONSILLECTOMY     TOTAL HIP ARTHROPLASTY Right 06/11/2021   Procedure: TOTAL HIP ARTHROPLASTY ANTERIOR APPROACH;  Surgeon: [name], MD;  [facility];  Service: Orthopedics;  Laterality: Right;   TOTAL KNEE ARTHROPLASTY Left 02/15/2015   Procedure: TOTAL KNEE ARTHROPLASTY;  Surgeon: [name], MD;  Location: [facility];  Service:  Orthopedics;  Laterality: Left;    There were no vitals filed for this visit.   Subjective Assessment - 01/29/22 0854     Subjective Pt reports no falls. Back pain is better, continues with right shoulder pain and weakness. Planning to make appt with ortho MD soon.    Pertinent History THA 05/2021, CVA 2018 (mild L hemi), gallbladder 08/12/21    How long can you walk comfortably? 20-30 mins    Patient Stated Goals "I want to get off the walker, just basically get around better"    Currently in Pain? Yes    Pain Score 7     Pain Location Shoulder    Pain Orientation Right    Pain Descriptors / Indicators Aching;Sharp;Throbbing;Sore    Pain Radiating Towards down arm into hand    Pain Onset More than a month ago    Pain Frequency Constant     Aggravating Factors  using arm. sleeping on it    Pain Relieving Factors Voltaren gel/Aspercream, BC powders, heat, Tylenol, Gabapentin                   [facility] Adult PT Treatment/Exercise - 01/29/22 0859       Transfers   Transfers Stand to Sit;Sit to Stand;Floor to Transfer    Sit to Stand 6: Modified independent (Device/Increase time)    Stand to Sit 6: Modified independent (Device/Increase time)    Floor to Transfer 5: Supervision    Floor to Transfer Details (indicate cue type and reason) next to mat table: use of UE's on mat table to lower down to knees (tall kneeling) then to lower all the way down onto mat to sitting on buttocks. From here pt rolled toward right to come into quadruped, crawled to mat table, use of UEs on mat table with right LE coming up for half kneeling and then pushing into standing to turn and sit onto mat table. no physical assistance needed.      Ambulation/Gait   Ambulation/Gait Yes    Ambulation/Gait Assistance 5: Supervision;4: Min guard;4: Min assist    Ambulation/Gait Assistance Details use of walker to enter/exit session. use of cane in session. supervision to min guard indoors with one episode of left foot scuffing needing min assist to correct. min guard to min assist for outdoors with both feet scuffing at times, veering noted and cues needed for increased step length and step height with gait.    Ambulation Distance (Feet) 230 Feet   x1 indoors only, then 500 feet indoor/outdoors   Assistive device Straight cane    Gait Pattern Step-through pattern;Decreased stride length;Decreased weight shift to right;Antalgic;Lateral hip instability;Trunk flexed    Ambulation Surface Level;Indoor      Knee/Hip Exercises: Aerobic   Other Aerobic Scifit UE/LE's level 4.0 x 6 minutes with goal >/= 95 steps per minute for strengthening and activity tolerance.                   PT Short Term Goals - 01/14/22 0953       PT SHORT TERM GOAL #1    Title Pt will be IND with initial HEP in order to indicate improved functional mobility and improved balance (Target Date: 02/13/22)    Time 4    Period Weeks    Status New    Target Date 02/13/22      PT SHORT TERM GOAL #2   Title Pt will improve DGI to >/=15/24 in order to indicate dec fall  risk.    Time 4    Period Weeks    Status New      PT SHORT TERM GOAL #3   Title Pt will improve gait speed to >/=3.0 ft/sec with SPC in order to indicate safe community ambulation.    Time 4    Period Weeks    Status New      PT SHORT TERM GOAL #4   Title Pt will negotiate up/down 8 steps with single rail using reciprocal pattern in order to indicate improved strength.    Time 4    Period Weeks    Status New      PT SHORT TERM GOAL #5   Title Pt will perform 5TSS in </=11.5 secs without UE support and without use of LEs on chair/posterior LOB.    Time 4    Period Weeks    Status New               PT Long Term Goals - 01/14/22 7829       PT LONG TERM GOAL #1   Title Pt will be IND with advanced HEP in order to maintain gains made with strength and balance. (Target Date: 03/15/22)    Time 8    Period Weeks    Status New    Target Date 03/15/22      PT LONG TERM GOAL #2   Title Pt will improve DGI to >/=16/24 in order to indicate dec fall risk.    Time 8    Period Weeks    Status New      PT LONG TERM GOAL #3   Title Pt will ambulate x 150' without device over indoor surfaces, scanning environment without LOB at mod I level in order to indicate safety at home.    Time 8    Period Weeks    Status New      PT LONG TERM GOAL #4   Title Pt will ambulate x 500' over unlevel outdoor paved surfaces with SPC at mod I level in order to return to leisure activities.    Time 8    Period Weeks    Status New      PT LONG TERM GOAL #5   Title Pt will negotiate up/down 16 steps with single R rail using reciprocal pattern in order to indicate improved functional strength and  endurance.    Time 8    Period Weeks    Status New                   Plan - 01/29/22 0856     Clinical Impression Statement Today's skilled session continued to address strengthening and gait with cane on various surfaces with increased assistance needed for balance (up to min assist) today vs previous session due to foot scuffing (bil sides). Also addressed floor transfers with supervision, cues for technique only. The pt is making steady progress toward goals and should benefit from conitnued PT to progress toward unmet goals.    Personal Factors and Comorbidities Comorbidity 3+;Age    Comorbidities see above    Examination-Activity Limitations Bed Mobility;Carry;Locomotion Level;Transfers;Squat;Stairs;Stand    Examination-Participation Restrictions Community Activity;Cleaning;Laundry    Stability/Clinical Decision Making Evolving/Moderate complexity    Rehab Potential Good    PT Frequency 2x / week    PT Duration 8 weeks    PT Treatment/Interventions ADLs/Self Care Home Management;Aquatic Therapy;DME Instruction;Gait training;Stair training;Functional mobility training;Therapeutic activities;Therapeutic exercise;Balance training;Neuromuscular re-education;Patient/family education;Passive range of motion;Dry needling;Vestibular  PT Next Visit Plan continued to work on activity tolerance/ LLEstrengthening, standing balance progressing to compliant surfaces as able and gait training with straight cane    PT Home Exercise Plan Access Code: [email or web address]    Consulted and Agree with Plan of Care Patient             Patient will benefit from skilled therapeutic intervention in order to improve the following deficits and impairments:  Abnormal gait, Decreased activity tolerance, Decreased balance, Decreased endurance, Decreased mobility, Decreased strength, Difficulty walking, Impaired perceived functional ability, Impaired flexibility, Impaired sensation, Postural dysfunction  Visit  Diagnosis: Unsteadiness on feet  Muscle weakness (generalized)  Other abnormalities of gait and mobility     Problem List Patient Active Problem List   Diagnosis Date Noted   History of laparoscopic cholecystectomy 08/15/2021   History of cholecystitis 08/15/2021   Status post total hip replacement, right 06/13/2021   Primary osteoarthritis of right hip 06/11/2021   Status post total replacement of right hip 06/11/2021   Complete tear of right rotator cuff 01/18/2018   Impingement syndrome of right shoulder 01/18/2018   AC (acromioclavicular) arthritis 01/18/2018   Rotator cuff tear 01/18/2018   Low oxygen saturation 12/09/2017   Chronic constipation    Failed back syndrome    Chronic pain syndrome    Radicular pain    Chronic bilateral low back pain without sciatica    Dysphagia, post-stroke    Left hemiparesis (HCC)    Acute ischemic stroke ([place]) 12/15/2016   Urinary retention 02/25/2015   Primary osteoarthritis of left knee 02/15/2015   Morbid obesity ([place]) 02/15/2015   Essential hypertension, benign 09/07/2014   Hyperlipidemia 09/05/2014   Gout 09/05/2014   Neuropathy 09/05/2014   Radiculopathy 08/29/2014    Willow Ora, PTA, [facility] [address] [place], [place] [address] [phone] 01/29/22, 3:25 PM   Name: Ellen Henry MRN: [id number] Date of Birth: 02/18/1946

## 2022-02-02 ENCOUNTER — Ambulatory Visit: Payer: Medicare Other | Admitting: Physical Therapy

## 2022-02-02 ENCOUNTER — Other Ambulatory Visit: Payer: Self-pay

## 2022-02-02 ENCOUNTER — Encounter: Payer: Self-pay | Admitting: Physical Therapy

## 2022-02-02 DIAGNOSIS — R2689 Other abnormalities of gait and mobility: Secondary | ICD-10-CM | POA: Diagnosis not present

## 2022-02-02 DIAGNOSIS — M6281 Muscle weakness (generalized): Secondary | ICD-10-CM

## 2022-02-02 DIAGNOSIS — R2681 Unsteadiness on feet: Secondary | ICD-10-CM | POA: Diagnosis not present

## 2022-02-02 DIAGNOSIS — M25551 Pain in right hip: Secondary | ICD-10-CM | POA: Diagnosis not present

## 2022-02-02 NOTE — Therapy (Signed)
[facility] [address] [place], [place], [address] Phone: [phone]   Fax:  [phone]  Physical Therapy Treatment  Patient Details  Name: Ellen Henry MRN: [id number] Date of Birth: 1946/10/27 Referring Provider (PT): Lucianne Lei, MD   Encounter Date: 02/02/2022   PT End of Session - 02/02/22 0940     Visit Number 6    Number of Visits 17    Date for PT Re-Evaluation 03/15/22    Authorization Type U[employer] Medicare (Progress note needed on visit 10)    Progress Note Due on Visit 10    PT Start Time 0930    PT Stop Time 1012    PT Time Calculation (min) 42 min    Equipment Utilized During Treatment Gait belt    Activity Tolerance Patient tolerated treatment well    Behavior During Therapy WFL for tasks assessed/performed             Past Medical History:  Diagnosis Date   Acute kidney failure due to procedure    Arthritis    Complication of anesthesia    after nerve block difficult breathing (at [place] [address] transported to [facility])   Dry skin    GERD (gastroesophageal reflux disease)    "years ago", diet controlled   Gout    H/O pyelonephritis    as a child   Heart murmur    since birth, denies any problems    History of sepsis    after spinal surgery   Hypertension    Impingement syndrome of right shoulder    Neuropathy    Osteoarthritis of AC (acromioclavicular) joint    Right   Pneumonia    Radiculopathy    Rotator cuff tear, right    Sleep apnea    does not use cpap   Stroke ([place]) 12/15/2016   left side weakness    Past Surgical History:  Procedure Laterality Date   ABDOMINAL EXPOSURE N/A 08/29/2014   Procedure: ABDOMINAL EXPOSURE;  Surgeon: Rosetta Posner, MD;  Location: [facility];  Service: Vascular;  Laterality: N/A;   ABDOMINAL HYSTERECTOMY     ANTERIOR LUMBAR FUSION N/A 08/29/2014   Procedure: ANTERIOR LUMBAR FUSION 1 LEVEL;  Surgeon: Sinclair Ship, MD;   Location: [facility];  Service: Orthopedics;  Laterality: N/A;  Lumbar 4-5 anterior lumbar interbody fusion with allograft and instrumentation.   APPENDECTOMY     BACK SURGERY  08/28/2014 and 08/29/2014   lumbar fusion   CARPAL TUNNEL RELEASE Right    release done twice   CHOLECYSTECTOMY N/A 08/12/2021   Procedure: LAPAROSCOPIC CHOLECYSTECTOMY;  Surgeon: Jesusita Oka, MD;  Location: [facility];  Service: General;  Laterality: N/A;   COLONOSCOPY     JOINT REPLACEMENT     left knee replacement     lower intestine removal     REPLACEMENT TOTAL KNEE  2010   rigth knee   SHOULDER ARTHROSCOPY Right 01/18/2018   Procedure: RIGHT SHOULDER ARTHROSCOPY, SUBACROMIAL DECOMPRESSION, DISTAL CLAVICLE RESECTION WITH MINI OPEN ROTATOR CUFF REPAIR;  Surgeon: Garald Balding, MD;  Location: [facility];  Service: Orthopedics;  Laterality: Right;   TONSILLECTOMY     TOTAL HIP ARTHROPLASTY Right 06/11/2021   Procedure: TOTAL HIP ARTHROPLASTY ANTERIOR APPROACH;  Surgeon: [name], MD;  Location: [facility];  Service: Orthopedics;  Laterality: Right;   TOTAL KNEE ARTHROPLASTY Left 02/15/2015   Procedure: TOTAL KNEE ARTHROPLASTY;  Surgeon: [name], MD;  Location: [facility];  Service:  Orthopedics;  Laterality: Left;    There were no vitals filed for this visit.   Subjective Assessment - 02/02/22 0933     Subjective Pt reports that she has an orthopedic appointment for shoulder this coming Wednesday.    Pertinent History THA 05/2021, CVA 2018 (mild L hemi), gallbladder 08/12/21    How long can you walk comfortably? 20-30 mins    Patient Stated Goals "I want to get off the walker, just basically get around better"    Pain Onset More than a month ago                               [facility] Adult PT Treatment/Exercise - 02/02/22 0001       Exercises   Exercises Lumbar;Knee/Hip      Lumbar Exercises: Stretches   Pelvic Tilt 10 reps      Knee/Hip Exercises: Supine   Knee Flexion Strengthening;Both;10  reps;2 sets   Marching, cues to hold contraction, progressed 2nd set with 2# ankle weights.                Balance Exercises - 02/02/22 0001       Balance Exercises: Standing   Standing Eyes Opened --   intermittent UEsupport with head movements   Standing Eyes Closed Narrow base of support (BOS);1 rep;20 secs;Head turns    Marching Upper extremity assist 1   cues for decrease UE support and to hold up knee for SLS.               PT Education - 02/02/22 0953     Education Details Edu on sustainable HEP practice and importance of reviewing HEP for hip from [facility] rehab packet for strengthening.    Person(s) Educated Patient    Methods Explanation    Comprehension Verbalized understanding              PT Short Term Goals - 01/14/22 0953       PT SHORT TERM GOAL #1   Title Pt will be IND with initial HEP in order to indicate improved functional mobility and improved balance (Target Date: 02/13/22)    Time 4    Period Weeks    Status New    Target Date 02/13/22      PT SHORT TERM GOAL #2   Title Pt will improve DGI to >/=15/24 in order to indicate dec fall risk.    Time 4    Period Weeks    Status New      PT SHORT TERM GOAL #3   Title Pt will improve gait speed to >/=3.0 ft/sec with SPC in order to indicate safe community ambulation.    Time 4    Period Weeks    Status New      PT SHORT TERM GOAL #4   Title Pt will negotiate up/down 8 steps with single rail using reciprocal pattern in order to indicate improved strength.    Time 4    Period Weeks    Status New      PT SHORT TERM GOAL #5   Title Pt will perform 5TSS in </=11.5 secs without UE support and without use of LEs on chair/posterior LOB.    Time 4    Period Weeks    Status New               PT Long Term Goals - 01/14/22 0947  PT LONG TERM GOAL #1   Title Pt will be IND with advanced HEP in order to maintain gains made with strength and balance. (Target Date: 03/15/22)    Time 8     Period Weeks    Status New    Target Date 03/15/22      PT LONG TERM GOAL #2   Title Pt will improve DGI to >/=16/24 in order to indicate dec fall risk.    Time 8    Period Weeks    Status New      PT LONG TERM GOAL #3   Title Pt will ambulate x 150' without device over indoor surfaces, scanning environment without LOB at mod I level in order to indicate safety at home.    Time 8    Period Weeks    Status New      PT LONG TERM GOAL #4   Title Pt will ambulate x 500' over unlevel outdoor paved surfaces with SPC at mod I level in order to return to leisure activities.    Time 8    Period Weeks    Status New      PT LONG TERM GOAL #5   Title Pt will negotiate up/down 16 steps with single R rail using reciprocal pattern in order to indicate improved functional strength and endurance.    Time 8    Period Weeks    Status New                   Plan - 02/02/22 0945     Clinical Impression Statement Progressed LE strengthening with weights and increased reps for quad and hip strengthening.  Pt reports some discomfort in L hip with strengthening exercises but was able to perform with decreased reps.  Pt continues to need intermittent UE support for dynamic balance exercises with narrow BOS on solid surface.    Personal Factors and Comorbidities Comorbidity 3+;Age    Comorbidities see above    Examination-Activity Limitations Bed Mobility;Carry;Locomotion Level;Transfers;Squat;Stairs;Stand    Examination-Participation Restrictions Community Activity;Cleaning;Laundry    Stability/Clinical Decision Making Evolving/Moderate complexity    Rehab Potential Good    PT Frequency 2x / week    PT Duration 8 weeks    PT Treatment/Interventions ADLs/Self Care Home Management;Aquatic Therapy;DME Instruction;Gait training;Stair training;Functional mobility training;Therapeutic activities;Therapeutic exercise;Balance training;Neuromuscular re-education;Patient/family education;Passive  range of motion;Dry needling;Vestibular    PT Next Visit Plan continued to work on activity tolerance/ LLEstrengthening, standing balance progressing to compliant surfaces as able and gait training with straight cane    PT Home Exercise Plan Access Code: [email or web address]    Consulted and Agree with Plan of Care Patient             Patient will benefit from skilled therapeutic intervention in order to improve the following deficits and impairments:  Abnormal gait, Decreased activity tolerance, Decreased balance, Decreased endurance, Decreased mobility, Decreased strength, Difficulty walking, Impaired perceived functional ability, Impaired flexibility, Impaired sensation, Postural dysfunction  Visit Diagnosis: Unsteadiness on feet  Muscle weakness (generalized)  Other abnormalities of gait and mobility     Problem List Patient Active Problem List   Diagnosis Date Noted   History of laparoscopic cholecystectomy 08/15/2021   History of cholecystitis 08/15/2021   Status post total hip replacement, right 06/13/2021   Primary osteoarthritis of right hip 06/11/2021   Status post total replacement of right hip 06/11/2021   Complete tear of right rotator cuff 01/18/2018   Impingement syndrome of right shoulder 01/18/2018  AC (acromioclavicular) arthritis 01/18/2018   Rotator cuff tear 01/18/2018   Low oxygen saturation 12/09/2017   Chronic constipation    Failed back syndrome    Chronic pain syndrome    Radicular pain    Chronic bilateral low back pain without sciatica    Dysphagia, post-stroke    Left hemiparesis ([facility])    Acute ischemic stroke ([facility]) 12/15/2016   Urinary retention 02/25/2015   Primary osteoarthritis of left knee 02/15/2015   Morbid obesity ([facility]) 02/15/2015   Essential hypertension, benign 09/07/2014   Hyperlipidemia 09/05/2014   Gout 09/05/2014   Neuropathy 09/05/2014   Radiculopathy 08/29/2014    Bjorn Loser, PTA  02/02/22, 11:24 AM   [facility] [address] [place], [place], [address] Phone: [phone]   Fax:  [phone]  Name: DIDI GANAWAY MRN: [id number] Date of Birth: 10/13/1946

## 2022-02-04 ENCOUNTER — Ambulatory Visit: Payer: Medicare Other | Admitting: Orthopaedic Surgery

## 2022-02-04 ENCOUNTER — Encounter: Payer: Self-pay | Admitting: Rehabilitation

## 2022-02-04 ENCOUNTER — Other Ambulatory Visit: Payer: Self-pay

## 2022-02-04 ENCOUNTER — Ambulatory Visit: Payer: Medicare Other | Attending: Family Medicine | Admitting: Rehabilitation

## 2022-02-04 DIAGNOSIS — R2689 Other abnormalities of gait and mobility: Secondary | ICD-10-CM | POA: Diagnosis not present

## 2022-02-04 DIAGNOSIS — M25551 Pain in right hip: Secondary | ICD-10-CM | POA: Diagnosis not present

## 2022-02-04 DIAGNOSIS — R2681 Unsteadiness on feet: Secondary | ICD-10-CM | POA: Diagnosis not present

## 2022-02-04 DIAGNOSIS — M6281 Muscle weakness (generalized): Secondary | ICD-10-CM | POA: Diagnosis not present

## 2022-02-04 NOTE — Therapy (Signed)
[facility] [address] [place], [place], [address] Phone: [phone]   Fax:  [phone]  Physical Therapy Treatment  Patient Details  Name: Ellen Henry MRN: [id number] Date of Birth: [birth year] Referring Provider (PT): Lucianne Lei, MD   Encounter Date: 02/04/2022   PT End of Session - 02/04/22 0850     Visit Number 7    Number of Visits 17    Date for PT Re-Evaluation 03/15/22    Authorization Type [employer]HC Medicare (Progress note needed on visit 10)    Progress Note Due on Visit 10    PT Start Time [phone]    PT Stop Time 0930    PT Time Calculation (min) 44 min    Equipment Utilized During Treatment Gait belt    Activity Tolerance Patient tolerated treatment well    Behavior During Therapy WFL for tasks assessed/performed             Past Medical History:  Diagnosis Date   Acute kidney failure due to procedure    Arthritis    Complication of anesthesia    after nerve block difficult breathing (at [place] on [address] transported to [facility])   Dry skin    GERD (gastroesophageal reflux disease)    "years ago", diet controlled   Gout    H/O pyelonephritis    as a child   Heart murmur    since birth, denies any problems    History of sepsis    after spinal surgery   Hypertension    Impingement syndrome of right shoulder    Neuropathy    Osteoarthritis of AC (acromioclavicular) joint    Right   Pneumonia    Radiculopathy    Rotator cuff tear, right    Sleep apnea    does not use cpap   Stroke ([facility]) 12/15/2016   left side weakness    Past Surgical History:  Procedure Laterality Date   ABDOMINAL EXPOSURE N/A 08/29/2014   Procedure: ABDOMINAL EXPOSURE;  Surgeon: Rosetta Posner, MD;  Location: [facility];  Service: Vascular;  Laterality: N/A;   ABDOMINAL HYSTERECTOMY     ANTERIOR LUMBAR FUSION N/A 08/29/2014   Procedure: ANTERIOR LUMBAR FUSION 1 LEVEL;  Surgeon: Sinclair Ship, MD;   Location: [facility];  Service: Orthopedics;  Laterality: N/A;  Lumbar 4-5 anterior lumbar interbody fusion with allograft and instrumentation.   APPENDECTOMY     BACK SURGERY  08/28/2014 and 08/29/2014   lumbar fusion   CARPAL TUNNEL RELEASE Right    release done twice   CHOLECYSTECTOMY N/A 08/12/2021   Procedure: LAPAROSCOPIC CHOLECYSTECTOMY;  Surgeon: Jesusita Oka, MD;  Location: [facility];  Service: General;  Laterality: N/A;   COLONOSCOPY     JOINT REPLACEMENT     left knee replacement     lower intestine removal     REPLACEMENT TOTAL KNEE  2010   rigth knee   SHOULDER ARTHROSCOPY Right 01/18/2018   Procedure: RIGHT SHOULDER ARTHROSCOPY, SUBACROMIAL DECOMPRESSION, DISTAL CLAVICLE RESECTION WITH MINI OPEN ROTATOR CUFF REPAIR;  Surgeon: Garald Balding, MD;  Location: [facility];  Service: Orthopedics;  Laterality: Right;   TONSILLECTOMY     TOTAL HIP ARTHROPLASTY Right 06/11/2021   Procedure: TOTAL HIP ARTHROPLASTY ANTERIOR APPROACH;  Surgeon: [name], MD;  [facility];  Service: Orthopedics;  Laterality: Right;   TOTAL KNEE ARTHROPLASTY Left 02/15/2015   Procedure: TOTAL KNEE ARTHROPLASTY;  Surgeon: [name], MD;  Location: [facility];  Service:  Orthopedics;  Laterality: Left;    There were no vitals filed for this visit.   Subjective Assessment - 02/04/22 1209     Subjective Pt reports appt this afternoon to see ortho for shoulder.  PT educated to get OT order.    Pertinent History THA 05/2021, CVA 2018 (mild L hemi), gallbladder 08/12/21    Patient Stated Goals "I want to get off the walker, just basically get around better"    Currently in Pain? No/denies                               [facility] Adult PT Treatment/Exercise - 02/04/22 0856       Transfers   Transfers Sit to Stand;Stand to Sit    Sit to Stand 6: Modified independent (Device/Increase time)    Stand to Sit 6: Modified independent (Device/Increase time)      Ambulation/Gait   Ambulation/Gait Yes     Ambulation/Gait Assistance 5: Supervision;4: Min guard;4: Min assist    Ambulation/Gait Assistance Details Ambulated during session and during active rests with SPC at S level.  Good awareness of L foot today, however did note with conversation that L foot did catch slightly, but not enough to need assist. PT did bring up foot up brace. Did not try as she did not have lace up shoes, but told her that if she did want to try, to bring/wear lace up shoes to a future session.Pt verbalized understanding.    Ambulation Distance (Feet) 230 Feet   x 1, 115' x 2 reps   Assistive device Straight cane    Gait Pattern Step-through pattern;Decreased stride length;Decreased weight shift to right;Antalgic;Lateral hip instability;Trunk flexed    Ambulation Surface Level;Indoor    Stairs Yes    Stairs Assistance 5: Supervision    Stairs Assistance Details (indicate cue type and reason) Attempted to perform stairs without rail (at least one) to simulate going to mailbox (3-4 steps) without rail.  She is able to perform in step to fashion, with cues for placement of SPC but does need intermittent assist from L hand at times.    Stair Management Technique One rail Left;Step to pattern;Forwards;With cane    Number of Stairs 4    Height of Stairs 6      Neuro Re-ed    Neuro Re-ed Details  NMR to address stepping strategy: Standing on foam beam with feet shoulder width apart with forward/backwards stepping alt LEs x 10 reps each with only intermittent support as needed.  Stepping to various targets called out by PT (varying feet as well). Note she has marked difficulty crossing LLE close to and over midline.  Min A throughout tasks. Resisted gait x 115 with PT providing resistance throughout but also intermittently giving external perturbations in varying directions having pt have to step or cross midline to catch balance.  Intermittent min A needed, but overall demonstrated good step strategy.      Exercises   Other  Exercises  Lateral step ups/down to 6" step with light BUE support with cues for wider steps.  Seated scifit x 5 mins at level 4 resistance with BUEs/LEs for general strength and endurance.                       PT Short Term Goals - 01/14/22 0953       PT SHORT TERM GOAL #1   Title Pt will be  IND with initial HEP in order to indicate improved functional mobility and improved balance (Target Date: 02/13/22)    Time 4    Period Weeks    Status New    Target Date 02/13/22      PT SHORT TERM GOAL #2   Title Pt will improve DGI to >/=15/24 in order to indicate dec fall risk.    Time 4    Period Weeks    Status New      PT SHORT TERM GOAL #3   Title Pt will improve gait speed to >/=3.0 ft/sec with SPC in order to indicate safe community ambulation.    Time 4    Period Weeks    Status New      PT SHORT TERM GOAL #4   Title Pt will negotiate up/down 8 steps with single rail using reciprocal pattern in order to indicate improved strength.    Time 4    Period Weeks    Status New      PT SHORT TERM GOAL #5   Title Pt will perform 5TSS in </=11.5 secs without UE support and without use of LEs on chair/posterior LOB.    Time 4    Period Weeks    Status New               PT Long Term Goals - 01/14/22 3086       PT LONG TERM GOAL #1   Title Pt will be IND with advanced HEP in order to maintain gains made with strength and balance. (Target Date: 03/15/22)    Time 8    Period Weeks    Status New    Target Date 03/15/22      PT LONG TERM GOAL #2   Title Pt will improve DGI to >/=16/24 in order to indicate dec fall risk.    Time 8    Period Weeks    Status New      PT LONG TERM GOAL #3   Title Pt will ambulate x 150' without device over indoor surfaces, scanning environment without LOB at mod I level in order to indicate safety at home.    Time 8    Period Weeks    Status New      PT LONG TERM GOAL #4   Title Pt will ambulate x 500' over unlevel outdoor  paved surfaces with SPC at mod I level in order to return to leisure activities.    Time 8    Period Weeks    Status New      PT LONG TERM GOAL #5   Title Pt will negotiate up/down 16 steps with single R rail using reciprocal pattern in order to indicate improved functional strength and endurance.    Time 8    Period Weeks    Status New                   Plan - 02/04/22 1210     Clinical Impression Statement Skilled session focused on high level balance with stepping strategies, gait with SPC during active rest breaks to improve overall endurance, and strengthening for R hip.  Pt fatigues very quickly and discussed walking program (a set aside time for walking for exercise) starting with 4-5 mins with RW and increasing weekly as able.    Personal Factors and Comorbidities Comorbidity 3+;Age    Comorbidities see above    Examination-Activity Limitations Bed Mobility;Carry;Locomotion Level;Transfers;Squat;Stairs;Stand    Examination-Participation Restrictions [employer]  Activity;Cleaning;Laundry    Stability/Clinical Decision Making Evolving/Moderate complexity    Rehab Potential Good    PT Frequency 2x / week    PT Duration 8 weeks    PT Treatment/Interventions ADLs/Self Care Home Management;Aquatic Therapy;DME Instruction;Gait training;Stair training;Functional mobility training;Therapeutic activities;Therapeutic exercise;Balance training;Neuromuscular re-education;Patient/family education;Passive range of motion;Dry needling;Vestibular    PT Next Visit Plan try foot up brace on LLE if she wants to see what it feels like, continued to work on activity tolerance/ LLEstrengthening, standing balance progressing to compliant surfaces as able and gait training with straight cane    PT Home Exercise Plan Access Code: [email or web address]    Consulted and Agree with Plan of Care Patient             Patient will benefit from skilled therapeutic intervention in order to improve the following  deficits and impairments:  Abnormal gait, Decreased activity tolerance, Decreased balance, Decreased endurance, Decreased mobility, Decreased strength, Difficulty walking, Impaired perceived functional ability, Impaired flexibility, Impaired sensation, Postural dysfunction  Visit Diagnosis: Unsteadiness on feet  Muscle weakness (generalized)  Other abnormalities of gait and mobility  Pain in right hip     Problem List Patient Active Problem List   Diagnosis Date Noted   History of laparoscopic cholecystectomy 08/15/2021   History of cholecystitis 08/15/2021   Status post total hip replacement, right 06/13/2021   Primary osteoarthritis of right hip 06/11/2021   Status post total replacement of right hip 06/11/2021   Complete tear of right rotator cuff 01/18/2018   Impingement syndrome of right shoulder 01/18/2018   AC (acromioclavicular) arthritis 01/18/2018   Rotator cuff tear 01/18/2018   Low oxygen saturation 12/09/2017   Chronic constipation    Failed back syndrome    Chronic pain syndrome    Radicular pain    Chronic bilateral low back pain without sciatica    Dysphagia, post-stroke    Left hemiparesis (HCC)    Acute ischemic stroke ([place]) 12/15/2016   Urinary retention 02/25/2015   Primary osteoarthritis of left knee 02/15/2015   Morbid obesity ([place]) 02/15/2015   Essential hypertension, benign 09/07/2014   Hyperlipidemia 09/05/2014   Gout 09/05/2014   Neuropathy 09/05/2014   Radiculopathy 08/29/2014    Cameron Sprang, PT, MPT [facility] [address] [place], [place], [address] Phone: [phone]   Fax:  [phone] 02/04/22, 12:13 PM   Name: Ellen Henry MRN: [id number] Date of Birth: 1946-09-08

## 2022-02-05 ENCOUNTER — Ambulatory Visit: Payer: Self-pay

## 2022-02-05 ENCOUNTER — Ambulatory Visit: Payer: Medicare Other | Admitting: Orthopaedic Surgery

## 2022-02-05 VITALS — Ht 60.0 in | Wt 200.0 lb

## 2022-02-05 DIAGNOSIS — G8929 Other chronic pain: Secondary | ICD-10-CM

## 2022-02-05 DIAGNOSIS — M25511 Pain in right shoulder: Secondary | ICD-10-CM

## 2022-02-05 MED ORDER — TRAMADOL HCL 50 MG PO TABS: 50.0000 mg | ORAL_TABLET | Freq: Two times a day (BID) | ORAL | 0 refills | Status: DC | PRN

## 2022-02-05 NOTE — Progress Notes (Signed)
? ?Office Visit Note ?  ?Patient: Ellen Henry           ?Date of Birth: 1946-03-28           ?MRN: [id number] ?Visit Date: 02/05/2022 ?             ?Requested by: Lucianne Lei, MD ?[address] ?[place],  [place] [address] ?PCP: Lucianne Lei, MD ? ? ?Assessment & Plan: ?Visit Diagnoses: No diagnosis found. ? ?Plan: 76 year old woman who is 4 years status post right shoulder subacromial decompression distal clavicle excision and rotator cuff repair presents with a 1 year history of increasing pain in the right shoulder.  She is right-handed.  She is status post right hip replacement and bilateral knee replacements.  She has had significant pain.  She takes Tylenol and gabapentin but is asking for a stronger pain medication.  She has advanced arthritis of the right shoulder.  We will order a thin slice CT scan and she will follow-up with Dr. Marlou Sa for discussion of shoulder replacement.  She is not interested in injections as they have not helped her in the past.  She is requesting shoulder replacement surgery if she is a candidate ? ?Follow-Up Instructions: No follow-ups on file.  ? ?Orders:  ?No orders of the defined types were placed in this encounter. ? ?No orders of the defined types were placed in this encounter. ? ? ? ? Procedures: ?No procedures performed ? ? ?Clinical Data: ?No additional findings. ? ? ?Subjective: ?Chief Complaint  ?Patient presents with  ? Right Shoulder - Pain  ?Patient presents today for right shoulder pain. She said that it has been hurting for months. No known injury. Her pain radiates into the arm. She is right hand dominant. She has had prior right shoulder arthroscopy in 2019 with Dr.Whitfield. She has been taking Gabapentin and Tylenol for pain relief. ? ? ? ?Review of Systems  ?All other systems reviewed and are negative. ? ? ?Objective: ?Vital Signs: There were no vitals taken for this visit. ? ?Physical Exam ?Constitutional:   ?   Appearance: Normal appearance.  ?Pulmonary:   ?   Effort: Pulmonary effort is normal.  ?Neurological:  ?   Mental Status: She is alert.  ? ? ?Ortho Exam ?Patient is sitting in a chair pleasant to exam.  She is ambulating with a walker.  She has full forward elevation internal rotation behind her back resisted external and internal rotation strength is good.  Resisted abduction is good.  She is tender globally throughout the shoulder no redness no swelling ?Specialty Comments:  ?No specialty comments available. ? ?Imaging: ?No results found. ? ? ?PMFS History: ?Patient Active Problem List  ? Diagnosis Date Noted  ? History of laparoscopic cholecystectomy 08/15/2021  ? History of cholecystitis 08/15/2021  ? Status post total hip replacement, right 06/13/2021  ? Primary osteoarthritis of right hip 06/11/2021  ? Status post total replacement of right hip 06/11/2021  ? Complete tear of right rotator cuff 01/18/2018  ? Impingement syndrome of right shoulder 01/18/2018  ? AC (acromioclavicular) arthritis 01/18/2018  ? Rotator cuff tear 01/18/2018  ? Low oxygen saturation 12/09/2017  ? Chronic constipation   ? Failed back syndrome   ? Chronic pain syndrome   ? Radicular pain   ? Chronic bilateral low back pain without sciatica   ? Dysphagia, post-stroke   ? Left hemiparesis ([place])   ? Acute ischemic stroke ([place]) 12/15/2016  ? Urinary retention 02/25/2015  ?  Primary osteoarthritis of left knee 02/15/2015  ? Morbid obesity ([place]) 02/15/2015  ? Essential hypertension, benign 09/07/2014  ? Hyperlipidemia 09/05/2014  ? Gout 09/05/2014  ? Neuropathy 09/05/2014  ? Radiculopathy 08/29/2014  ? ?Past Medical History:  ?Diagnosis Date  ? Acute kidney failure due to procedure   ? Arthritis   ? Complication of anesthesia   ? after nerve block difficult breathing (at [place] transported to [facility])  ? Dry skin   ? GERD (gastroesophageal reflux disease)   ? "years ago", diet controlled  ? Gout   ? H/O pyelonephritis   ? as a child  ? Heart murmur   ? since birth,  denies any problems   ? History of sepsis   ? after spinal surgery  ? Hypertension   ? Impingement syndrome of right shoulder   ? Neuropathy   ? Osteoarthritis of AC (acromioclavicular) joint   ? Right  ? Pneumonia   ? Radiculopathy   ? Rotator cuff tear, right   ? Sleep apnea   ? does not use cpap  ? Stroke ([place]) 12/15/2016  ? left side weakness  ?  ?Family History  ?Problem Relation Age of Onset  ? Varicose Veins Mother   ?  ?Past Surgical History:  ?Procedure Laterality Date  ? ABDOMINAL EXPOSURE N/A 08/29/2014  ? Procedure: ABDOMINAL EXPOSURE;  Surgeon: Rosetta Posner, MD;  Location: [place];  Service: Vascular;  Laterality: N/A;  ? ABDOMINAL HYSTERECTOMY    ? ANTERIOR LUMBAR FUSION N/A 08/29/2014  ? Procedure: ANTERIOR LUMBAR FUSION 1 LEVEL;  Surgeon: Sinclair Ship, MD;  Location: [place];  Service: Orthopedics;  Laterality: N/A;  Lumbar 4-5 anterior lumbar interbody fusion with allograft and instrumentation.  ? APPENDECTOMY    ? BACK SURGERY  08/28/2014 and 08/29/2014  ? lumbar fusion  ? CARPAL TUNNEL RELEASE Right   ? release done twice  ? CHOLECYSTECTOMY N/A 08/12/2021  ? Procedure: LAPAROSCOPIC CHOLECYSTECTOMY;  Surgeon: Jesusita Oka, MD;  Location: [facility];  Service: General;  Laterality: N/A;  ? COLONOSCOPY    ? JOINT REPLACEMENT    ? left knee replacement    ? lower intestine removal    ? REPLACEMENT TOTAL KNEE  2010  ? rigth knee  ? SHOULDER ARTHROSCOPY Right 01/18/2018  ? Procedure: RIGHT SHOULDER ARTHROSCOPY, SUBACROMIAL DECOMPRESSION, DISTAL CLAVICLE RESECTION WITH MINI OPEN ROTATOR CUFF REPAIR;  Surgeon: Garald Balding, MD;  Location: [facility];  Service: Orthopedics;  Laterality: Right;  ? TONSILLECTOMY    ? TOTAL HIP ARTHROPLASTY Right 06/11/2021  ? Procedure: TOTAL HIP ARTHROPLASTY ANTERIOR APPROACH;  Surgeon: [name], MD;  Location: [facility];  Service: Orthopedics;  Laterality: Right;  ? TOTAL KNEE ARTHROPLASTY Left 02/15/2015  ? Procedure: TOTAL KNEE ARTHROPLASTY;  Surgeon: [name], MD;   Location: [facility];  Service: Orthopedics;  Laterality: Left;  ? ?Social History  ? ?Occupational History  ? Not on file  ?Tobacco Use  ? Smoking status: Former  ?  Packs/day: 0.50  ?  Types: Cigarettes  ?  Quit date: 04/04/2021  ?  Years since quitting: 0.8  ? Smokeless tobacco: Never  ?Vaping Use  ? Vaping Use: Never used  ?Substance and Sexual Activity  ? Alcohol use: No  ? Drug use: No  ?  Comment: sometimes will take a vicodin from friend  ? Sexual activity: Never  ? ? ? ? ? ? ?

## 2022-02-10 ENCOUNTER — Ambulatory Visit: Payer: Medicare Other | Admitting: Rehabilitation

## 2022-02-10 ENCOUNTER — Other Ambulatory Visit: Payer: Self-pay

## 2022-02-10 ENCOUNTER — Encounter: Payer: Self-pay | Admitting: Rehabilitation

## 2022-02-10 DIAGNOSIS — R2681 Unsteadiness on feet: Secondary | ICD-10-CM

## 2022-02-10 DIAGNOSIS — M25551 Pain in right hip: Secondary | ICD-10-CM | POA: Diagnosis not present

## 2022-02-10 DIAGNOSIS — M6281 Muscle weakness (generalized): Secondary | ICD-10-CM

## 2022-02-10 DIAGNOSIS — R2689 Other abnormalities of gait and mobility: Secondary | ICD-10-CM | POA: Diagnosis not present

## 2022-02-10 NOTE — Therapy (Addendum)
OUTPATIENT PHYSICAL THERAPY TREATMENT NOTE   Patient Name: Ellen Henry MRN: [id number] DOB:11/25/1946, 76 y.o., female Today's Date: 02/10/2022  PCP: Lucianne Lei, MD REFERRING PROVIDER: Lucianne Lei, MD   PT End of Session - 02/10/22 [phone]     Visit Number 8    Number of Visits 17    Date for PT Re-Evaluation 03/15/22    Authorization Type [employer] Medicare (Progress note needed on visit 10)    Progress Note Due on Visit 10    PT Start Time [phone]    PT Stop Time 0930    PT Time Calculation (min) 44 min    Equipment Utilized During Treatment Gait belt    Activity Tolerance Patient tolerated treatment well    Behavior During Therapy WFL for tasks assessed/performed             Past Medical History:  Diagnosis Date   Acute kidney failure due to procedure    Arthritis    Complication of anesthesia    after nerve block difficult breathing (at [place] transported to [facility])   Dry skin    GERD (gastroesophageal reflux disease)    "years ago", diet controlled   Gout    H/O pyelonephritis    as a child   Heart murmur    since birth, denies any problems    History of sepsis    after spinal surgery   Hypertension    Impingement syndrome of right shoulder    Neuropathy    Osteoarthritis of AC (acromioclavicular) joint    Right   Pneumonia    Radiculopathy    Rotator cuff tear, right    Sleep apnea    does not use cpap   Stroke ([place]) 12/15/2016   left side weakness   Past Surgical History:  Procedure Laterality Date   ABDOMINAL EXPOSURE N/A 08/29/2014   Procedure: ABDOMINAL EXPOSURE;  Surgeon: Rosetta Posner, MD;  Location: [facility];  Service: Vascular;  Laterality: N/A;   ABDOMINAL HYSTERECTOMY     ANTERIOR LUMBAR FUSION N/A 08/29/2014   Procedure: ANTERIOR LUMBAR FUSION 1 LEVEL;  Surgeon: Sinclair Ship, MD;  Location: [facility];  Service: Orthopedics;  Laterality: N/A;  Lumbar 4-5 anterior lumbar interbody fusion with allograft and  instrumentation.   APPENDECTOMY     BACK SURGERY  08/28/2014 and 08/29/2014   lumbar fusion   CARPAL TUNNEL RELEASE Right    release done twice   CHOLECYSTECTOMY N/A 08/12/2021   Procedure: LAPAROSCOPIC CHOLECYSTECTOMY;  Surgeon: Jesusita Oka, MD;  Location: [place];  Service: General;  Laterality: N/A;   COLONOSCOPY     JOINT REPLACEMENT     left knee replacement     lower intestine removal     REPLACEMENT TOTAL KNEE  2010   rigth knee   SHOULDER ARTHROSCOPY Right 01/18/2018   Procedure: RIGHT SHOULDER ARTHROSCOPY, SUBACROMIAL DECOMPRESSION, DISTAL CLAVICLE RESECTION WITH MINI OPEN ROTATOR CUFF REPAIR;  Surgeon: Garald Balding, MD;  Location: [facility];  Service: Orthopedics;  Laterality: Right;   TONSILLECTOMY     TOTAL HIP ARTHROPLASTY Right 06/11/2021   Procedure: TOTAL HIP ARTHROPLASTY ANTERIOR APPROACH;  Surgeon: [name], MD;  [facility];  Service: Orthopedics;  Laterality: Right;   TOTAL KNEE ARTHROPLASTY Left 02/15/2015   Procedure: TOTAL KNEE ARTHROPLASTY;  Surgeon: [name], MD;  Location: [facility];  Service: Orthopedics;  Laterality: Left;   Patient Active Problem List   Diagnosis Date Noted   History of laparoscopic  cholecystectomy 08/15/2021   History of cholecystitis 08/15/2021   Status post total hip replacement, right 06/13/2021   Primary osteoarthritis of right hip 06/11/2021   Status post total replacement of right hip 06/11/2021   Complete tear of right rotator cuff 01/18/2018   Impingement syndrome of right shoulder 01/18/2018   AC (acromioclavicular) arthritis 01/18/2018   Rotator cuff tear 01/18/2018   Low oxygen saturation 12/09/2017   Chronic constipation    Failed back syndrome    Chronic pain syndrome    Radicular pain    Chronic bilateral low back pain without sciatica    Dysphagia, post-stroke    Left hemiparesis ([place])    Acute ischemic stroke ([place]) 12/15/2016   Urinary retention 02/25/2015   Primary osteoarthritis of left knee 02/15/2015    Morbid obesity ([place]) 02/15/2015   Essential hypertension, benign 09/07/2014   Hyperlipidemia 09/05/2014   Gout 09/05/2014   Neuropathy 09/05/2014   Radiculopathy 08/29/2014    REFERRING DIAG:R26.81 (ICD-10-CM) - Unsteadiness on feet R26.89 (ICD-10-CM) - Balance problem R26.9 (ICD-10-CM) - Gait abnormality   THERAPY DIAG:  Unsteadiness on feet  Muscle weakness (generalized)  Other abnormalities of gait and mobility  PERTINENT HISTORY: THA 05/2021, CVA 2018 (mild L hemi), gallbladder 08/12/21   PRECAUTIONS: Fall   SUBJECTIVE: Shoulder hurting.  Goes to get CT scan tomorrow then has follow up at ortho on 3/17 with Dr. Marlou Sa   PAIN:  Are you having pain? Yes NPRS scale: 7/10 Pain location: R shoulder  Pain orientation: Right  PAIN TYPE: throbbing Pain description: constant  Aggravating factors: moving, using arm  Relieving factors: nothing     TODAY'S TREATMENT:  Circuit training to get HR between 100-131 throughout (there were times we did not get to target).  Repeated x 3 rounds  Round 1: sit<>stand for 1 min without UE support Round 2: Step ups with BUE support x 1 min Round 3: marching in place x 1 min with single UE support   Therex to address hip tightness/pain and antalgic gait leading to balance issues: Supine knee to chest R LE x 30 secs, R knee to opposite shoulder x 30 secs, seated figure 4 stretch on R x 30 secs (assist from PT).  Standing R ITB stretch x 2 sets of 30 secs.  Provided cues for how to do a very gentle ITB massage with frozen water bottle standing with bottle between wall and her hip.  Pt verbalized understanding.    Discussed that she would definitely benefit from a follow up with her ortho MD as her R hip is extremely tender to touch and very tight.       PATIENT EDUCATION: Education details: Educated on rationale for Engineer, structural.  Person educated: Patient Education method: Explanation and Verbal cues Education comprehension: verbalized  understanding   HOME EXERCISE PROGRAM: [email or web address]   PT Short Term Goals - 01/14/22 0953       PT SHORT TERM GOAL #1   Title Pt will be IND with initial HEP in order to indicate improved functional mobility and improved balance (Target Date: 02/13/22)    Time 4    Period Weeks    Status New    Target Date 02/13/22      PT SHORT TERM GOAL #2   Title Pt will improve DGI to >/=15/24 in order to indicate dec fall risk.    Time 4    Period Weeks    Status New      PT SHORT TERM GOAL #  3   Title Pt will improve gait speed to >/=3.0 ft/sec with SPC in order to indicate safe community ambulation.    Time 4    Period Weeks    Status New      PT SHORT TERM GOAL #4   Title Pt will negotiate up/down 8 steps with single rail using reciprocal pattern in order to indicate improved strength.    Time 4    Period Weeks    Status New      PT SHORT TERM GOAL #5   Title Pt will perform 5TSS in </=11.5 secs without UE support and without use of LEs on chair/posterior LOB.    Time 4    Period Weeks    Status New              PT Long Term Goals - 01/14/22 7902       PT LONG TERM GOAL #1   Title Pt will be IND with advanced HEP in order to maintain gains made with strength and balance. (Target Date: 03/15/22)    Time 8    Period Weeks    Status New    Target Date 03/15/22      PT LONG TERM GOAL #2   Title Pt will improve DGI to >/=16/24 in order to indicate dec fall risk.    Time 8    Period Weeks    Status New      PT LONG TERM GOAL #3   Title Pt will ambulate x 150' without device over indoor surfaces, scanning environment without LOB at mod I level in order to indicate safety at home.    Time 8    Period Weeks    Status New      PT LONG TERM GOAL #4   Title Pt will ambulate x 500' over unlevel outdoor paved surfaces with SPC at mod I level in order to return to leisure activities.    Time 8    Period Weeks    Status New      PT LONG TERM GOAL #5   Title Pt will negotiate  up/down 16 steps with single R rail using reciprocal pattern in order to indicate improved functional strength and endurance.    Time 8    Period Weeks    Status New              Plan - 02/10/22 1958     Clinical Impression Statement Skilled session focused on improving cardiovascular endurance by performing functional tasks maintaining HR between 65-85% of HR max.  Pt with increased R hip pain at end of circuit training, therefore focused on decreasing pain with stretching.  R ITB and lateral hip are VERY TTP and did recommend that she follow up with ortho as she is able.    Personal Factors and Comorbidities Comorbidity 3+;Age    Comorbidities see above    Examination-Activity Limitations Bed Mobility;Carry;Locomotion Level;Transfers;Squat;Stairs;Stand    Examination-Participation Restrictions Community Activity;Cleaning;Laundry    Stability/Clinical Decision Making Evolving/Moderate complexity    Rehab Potential Good    PT Frequency 2x / week    PT Duration 8 weeks    PT Treatment/Interventions ADLs/Self Care Home Management;Aquatic Therapy;DME Instruction;Gait training;Stair training;Functional mobility training;Therapeutic activities;Therapeutic exercise;Balance training;Neuromuscular re-education;Patient/family education;Passive range of motion;Dry needling;Vestibular    PT Next Visit Plan try foot up brace on LLE if she wants to see what it feels like, continued to work on activity tolerance/ LLEstrengthening, standing balance progressing to compliant surfaces  as able and gait training with straight cane    PT Home Exercise Plan Access Code: [email or web address]    Consulted and Agree with Plan of Care Patient              Cameron Sprang, PT, MPT [facility] [address] [place], [place], [address] Phone: [phone]   Fax:  [phone] 02/10/22, 8:00 PM

## 2022-02-11 ENCOUNTER — Ambulatory Visit
Admission: RE | Admit: 2022-02-11 | Discharge: 2022-02-11 | Disposition: A | Discharge status: Home / Self Care | Payer: Medicare Other | Source: Ambulatory Visit | Attending: Orthopaedic Surgery | Admitting: Orthopaedic Surgery

## 2022-02-11 DIAGNOSIS — M25711 Osteophyte, right shoulder: Secondary | ICD-10-CM | POA: Diagnosis not present

## 2022-02-11 DIAGNOSIS — L989 Disorder of the skin and subcutaneous tissue, unspecified: Secondary | ICD-10-CM | POA: Diagnosis not present

## 2022-02-11 DIAGNOSIS — G8929 Other chronic pain: Secondary | ICD-10-CM

## 2022-02-11 DIAGNOSIS — M19011 Primary osteoarthritis, right shoulder: Secondary | ICD-10-CM | POA: Diagnosis not present

## 2022-02-11 DIAGNOSIS — M25511 Pain in right shoulder: Secondary | ICD-10-CM

## 2022-02-13 ENCOUNTER — Other Ambulatory Visit: Payer: Self-pay

## 2022-02-13 ENCOUNTER — Ambulatory Visit: Payer: Medicare Other | Admitting: Physical Therapy

## 2022-02-13 ENCOUNTER — Encounter: Payer: Self-pay | Admitting: Physical Therapy

## 2022-02-13 DIAGNOSIS — R2689 Other abnormalities of gait and mobility: Secondary | ICD-10-CM

## 2022-02-13 DIAGNOSIS — M6281 Muscle weakness (generalized): Secondary | ICD-10-CM

## 2022-02-13 DIAGNOSIS — M25551 Pain in right hip: Secondary | ICD-10-CM | POA: Diagnosis not present

## 2022-02-13 DIAGNOSIS — R2681 Unsteadiness on feet: Secondary | ICD-10-CM | POA: Diagnosis not present

## 2022-02-13 NOTE — Therapy (Signed)
OUTPATIENT PHYSICAL THERAPY TREATMENT NOTE   Patient Name: Ellen Henry MRN: [id number] DOB:[birth year], 76 y.o., female Today's Date: 02/13/2022  PCP: Lucianne Lei, MD REFERRING PROVIDER: Lucianne Lei, MD   PT End of Session - 02/13/22 0849     Visit Number 9    Number of Visits 17    Date for PT Re-Evaluation 03/15/22    Authorization Type [employer] Medicare (Progress note needed on visit 10)    Progress Note Due on Visit 10    PT Start Time 0848    PT Stop Time 0928    PT Time Calculation (min) 40 min    Equipment Utilized During Treatment Gait belt    Activity Tolerance Patient tolerated treatment well    Behavior During Therapy WFL for tasks assessed/performed             Past Medical History:  Diagnosis Date   Acute kidney failure due to procedure    Arthritis    Complication of anesthesia    after nerve block difficult breathing (at [facility] [place] transported to [facility])   Dry skin    GERD (gastroesophageal reflux disease)    "years ago", diet controlled   Gout    H/O pyelonephritis    as a child   Heart murmur    since birth, denies any problems    History of sepsis    after spinal surgery   Hypertension    Impingement syndrome of right shoulder    Neuropathy    Osteoarthritis of AC (acromioclavicular) joint    Right   Pneumonia    Radiculopathy    Rotator cuff tear, right    Sleep apnea    does not use cpap   Stroke ([place]) 12/15/2016   left side weakness   Past Surgical History:  Procedure Laterality Date   ABDOMINAL EXPOSURE N/A 08/29/2014   Procedure: ABDOMINAL EXPOSURE;  Surgeon: Rosetta Posner, MD;  Location: [place];  Service: Vascular;  Laterality: N/A;   ABDOMINAL HYSTERECTOMY     ANTERIOR LUMBAR FUSION N/A 08/29/2014   Procedure: ANTERIOR LUMBAR FUSION 1 LEVEL;  Surgeon: Sinclair Ship, MD;  Location: [place];  Service: Orthopedics;  Laterality: N/A;  Lumbar 4-5 anterior lumbar interbody fusion with allograft and  instrumentation.   APPENDECTOMY     BACK SURGERY  08/28/2014 and 08/29/2014   lumbar fusion   CARPAL TUNNEL RELEASE Right    release done twice   CHOLECYSTECTOMY N/A 08/12/2021   Procedure: LAPAROSCOPIC CHOLECYSTECTOMY;  Surgeon: Jesusita Oka, MD;  Location: [place];  Service: General;  Laterality: N/A;   COLONOSCOPY     JOINT REPLACEMENT     left knee replacement     lower intestine removal     REPLACEMENT TOTAL KNEE  2010   rigth knee   SHOULDER ARTHROSCOPY Right 01/18/2018   Procedure: RIGHT SHOULDER ARTHROSCOPY, SUBACROMIAL DECOMPRESSION, DISTAL CLAVICLE RESECTION WITH MINI OPEN ROTATOR CUFF REPAIR;  Surgeon: Garald Balding, MD;  Location: [place];  Service: Orthopedics;  Laterality: Right;   TONSILLECTOMY     TOTAL HIP ARTHROPLASTY Right 06/11/2021   Procedure: TOTAL HIP ARTHROPLASTY ANTERIOR APPROACH;  Surgeon: [name], MD;  [facility];  Service: Orthopedics;  Laterality: Right;   TOTAL KNEE ARTHROPLASTY Left 02/15/2015   Procedure: TOTAL KNEE ARTHROPLASTY;  Surgeon: [name], MD;  Location: [place];  Service: Orthopedics;  Laterality: Left;   Patient Active Problem List   Diagnosis Date Noted   History of laparoscopic  cholecystectomy 08/15/2021   History of cholecystitis 08/15/2021   Status post total hip replacement, right 06/13/2021   Primary osteoarthritis of right hip 06/11/2021   Status post total replacement of right hip 06/11/2021   Complete tear of right rotator cuff 01/18/2018   Impingement syndrome of right shoulder 01/18/2018   AC (acromioclavicular) arthritis 01/18/2018   Rotator cuff tear 01/18/2018   Low oxygen saturation 12/09/2017   Chronic constipation    Failed back syndrome    Chronic pain syndrome    Radicular pain    Chronic bilateral low back pain without sciatica    Dysphagia, post-stroke    Left hemiparesis ([place])    Acute ischemic stroke ([place]) 12/15/2016   Urinary retention 02/25/2015   Primary osteoarthritis of left knee 02/15/2015    Morbid obesity ([place]) 02/15/2015   Essential hypertension, benign 09/07/2014   Hyperlipidemia 09/05/2014   Gout 09/05/2014   Neuropathy 09/05/2014   Radiculopathy 08/29/2014    REFERRING DIAG:R26.81 (ICD-10-CM) - Unsteadiness on feet R26.89 (ICD-10-CM) - Balance problem R26.9 (ICD-10-CM) - Gait abnormality   THERAPY DIAG:  Unsteadiness on feet  Muscle weakness (generalized)  Other abnormalities of gait and mobility  PERTINENT HISTORY: THA 05/2021, CVA 2018 (mild L hemi), gallbladder 08/12/21   PRECAUTIONS: Fall   SUBJECTIVE: Shoulder hurting.  Had the CT scan and then has follow up at ortho on 3/17 with Dr. Marlou Sa. No falls.   PAIN:  Are you having pain? Yes NPRS scale: 7/10 Pain location: R shoulder  Pain orientation: Right  PAIN TYPE: Acute on Chronic (months) Pain description: constant, throbbing Aggravating factors: moving, using arm  Relieving factors: nothing, does take OTC medication    TODAY'S TREATMENT:   02/13/2022: Self Care: HEP- going well, not doing them all as she goes by memory,however gets a mix of strengthening and balance with what she does remember. When she looks at the paper she gets to the other ones she had forgotten. States she is really having issues with lifting her left leg and wants to focus on getting the leg stronger. Added 2 new hip strengthening ex's this session with no issues noted or reported with performance in session today. Refer to [place] program for details of ex's added today.       [facility] PT Assessment - 02/13/22 0859       Transfers   Transfers Sit to Stand;Stand to Sit    Sit to Stand 6: Modified independent (Device/Increase time)    Five time sit to stand comments  9.66 seconds no UE support from standard height surface    Stand to Sit 6: Modified independent (Device/Increase time)      Ambulation/Gait   Ambulation/Gait Yes    Ambulation/Gait Assistance 5: Supervision    Ambulation Distance (Feet) 115 Feet   x1 with cane,  plus around clinic with session with and without device   Assistive device Straight cane    Gait Pattern Step-through pattern;Decreased stride length;Decreased weight shift to right;Antalgic;Lateral hip instability;Trunk flexed    Ambulation Surface Level;Indoor    Gait velocity 12.97 sec's= 2.53 ft/sec    Stairs Yes    Stairs Assistance 5: Supervision    Stair Management Technique Two rails;Alternating pattern;Forwards    Number of Stairs 4    Height of Stairs 6      Standardized Balance Assessment   Standardized Balance Assessment Dynamic Gait Index      Dynamic Gait Index   Level Surface Normal    Change in Gait Speed Normal  Gait with Horizontal Head Turns Mild Impairment    Gait with Vertical Head Turns Mild Impairment    Gait and Pivot Turn Mild Impairment    Step Over Obstacle Moderate Impairment    Step Around Obstacles Mild Impairment    Steps Mild Impairment    Total Score 17    DGI comment: Scores of 19 or less are predicitve of falls in older community living adults.                02/10/2022 Circuit training to get HR between 100-131 throughout (there were times we did not get to target).  Repeated x 3 rounds  Round 1: sit<>stand for 1 min without UE support Round 2: Step ups with BUE support x 1 min Round 3: marching in place x 1 min with single UE support   Therex to address hip tightness/pain and antalgic gait leading to balance issues: Supine knee to chest R LE x 30 secs, R knee to opposite shoulder x 30 secs, seated figure 4 stretch on R x 30 secs (assist from PT).  Standing R ITB stretch x 2 sets of 30 secs.  Provided cues for how to do a very gentle ITB massage with frozen water bottle standing with bottle between wall and her hip.  Pt verbalized understanding.    Discussed that she would definitely benefit from a follow up with her ortho MD as her R hip is extremely tender to touch and very tight.       PATIENT EDUCATION: Education details: progress  toward goals; addition of hip strengthening ex's to HEP and to break up ex's to 3 a day. Pt currently getting them done in 15 minutes using a timer. Discussed not timing her self as she may be rushing with poor form.  Person educated: Patient Education method: Explanation and Verbal cues Education comprehension: verbalized understanding   HOME EXERCISE PROGRAM: [email or web address]   PT Short Term Goals - 01/14/22 0953       PT SHORT TERM GOAL #1   Title Pt will be IND with initial HEP in order to indicate improved functional mobility and improved balance (Target Date: 02/13/22)    Status 02/13/22: met in session today     PT SHORT TERM GOAL #2   Title Pt will improve DGI to >/=15/24 in order to indicate dec fall risk.    Baseline  02/13/22: 17/24 scored today   Status 02/13/22: met today      PT SHORT TERM GOAL #3   Title Pt will improve gait speed to >/=3.0 ft/sec with SPC in order to indicate safe community ambulation.    Baseline:  02/13/22: 2.53 ft/sec, has not been assessed with cane to date so unsure of progress made (last time performed was with RW)   Status Not met     PT SHORT TERM GOAL #4   Title Pt will negotiate up/down 8 steps with single rail using reciprocal pattern in order to indicate improved strength.    Baseline  02/13/22: continues to need both rails for reciprocal pattern   Status 02/13/22: Not met     PT SHORT TERM GOAL #5   Title Pt will perform 5TSS in </=11.5 secs without UE support and without use of LEs on chair/posterior LOB.    Baseline  02/13/22: 9.66 sec's no UE support or retropulsion   Status 02/13/22: met              PT Long Term Goals - 01/14/22 6606  PT LONG TERM GOAL #1   Title Pt will be IND with advanced HEP in order to maintain gains made with strength and balance. (Target Date: 03/15/22)    Time 8    Period Weeks    Status New    Target Date 03/15/22      PT LONG TERM GOAL #2   Title Pt will improve DGI to >/=16/24 in order to indicate  dec fall risk.    Time 8    Period Weeks    Status New      PT LONG TERM GOAL #3   Title Pt will ambulate x 150' without device over indoor surfaces, scanning environment without LOB at mod I level in order to indicate safety at home.    Time 8    Period Weeks    Status New      PT LONG TERM GOAL #4   Title Pt will ambulate x 500' over unlevel outdoor paved surfaces with SPC at mod I level in order to return to leisure activities.    Time 8    Period Weeks    Status New      PT LONG TERM GOAL #5   Title Pt will negotiate up/down 16 steps with single R rail using reciprocal pattern in order to indicate improved functional strength and endurance.    Time 8    Period Weeks    Status New              Plan - 02/10/22 1958     Clinical Impression Statement Today's skilled session focused on progress toward STGs with all goals met except for gait speed with cane and stairs with single rail use. Also added 2 new ex's to HEP to address left hip weakness with no issues noted or reported in session. The pt is making steady progress toward goals and should benefit from continued PT to progress toward unmet goals.    Personal Factors and Comorbidities Comorbidity 3+;Age    Comorbidities see above    Examination-Activity Limitations Bed Mobility;Carry;Locomotion Level;Transfers;Squat;Stairs;Stand    Examination-Participation Restrictions Community Activity;Cleaning;Laundry    Stability/Clinical Decision Making Evolving/Moderate complexity    Rehab Potential Good    PT Frequency 2x / week    PT Duration 8 weeks    PT Treatment/Interventions ADLs/Self Care Home Management;Aquatic Therapy;DME Instruction;Gait training;Stair training;Functional mobility training;Therapeutic activities;Therapeutic exercise;Balance training;Neuromuscular re-education;Patient/family education;Passive range of motion;Dry needling;Vestibular    PT Next Visit Plan try foot up brace on LLE if she wants to see what it  feels like, continued to work on activity tolerance/ LLEstrengthening, standing balance progressing to compliant surfaces as able and gait training with straight cane    PT Home Exercise Plan Access Code: [email or web address]    Consulted and Agree with Plan of Care Patient              Willow Ora, PTA, [place] [address], [place], [place] [address] [phone] 02/13/22, 4:20 PM

## 2022-02-13 NOTE — Patient Instructions (Signed)
Access Code: [email or web address] ?URL: [email or web address] ?Date: 02/13/2022 ?Prepared by: Willow Ora ? ?Exercises ?Sit to Stand with Counter Support - 1 x daily - 5 x weekly - 1 sets - 10 reps ?Standing Hip Abduction with Resistance at Ankles and Counter Support - 1 x daily - 5 x weekly - 1 sets - 10 reps ?Standing Hip Extension with Resistance at Ankles and Counter Support - 1 x daily - 5 x weekly - 1 sets - 10 reps ?Standing Tandem Balance with Counter Support - 1 x daily - 5 x weekly - 1 sets - 2 reps - 30 hold ?Standing Single Leg Stance with Counter Support - 1 x daily - 5 x weekly - 1 sets - 2 reps - 15 hold ?Romberg Stance with Eyes Closed - 1 x daily - 5 x weekly - 1 sets - 10 reps ?Single Leg Bridge - 1 x daily - 5 x weekly - 1 sets - 10 reps ?Modified Thomas Stretch - 1 x daily - 5 x weekly - 1 sets - 10 reps ? ?

## 2022-02-17 ENCOUNTER — Ambulatory Visit: Payer: Medicare Other | Admitting: Rehabilitation

## 2022-02-19 ENCOUNTER — Ambulatory Visit: Payer: Medicare Other | Admitting: Physical Therapy

## 2022-02-20 ENCOUNTER — Ambulatory Visit: Payer: Medicare Other | Admitting: Orthopedic Surgery

## 2022-02-20 ENCOUNTER — Ambulatory Visit (INDEPENDENT_AMBULATORY_CARE_PROVIDER_SITE_OTHER): Payer: Medicare Other

## 2022-02-20 ENCOUNTER — Encounter: Payer: Self-pay | Admitting: Orthopedic Surgery

## 2022-02-20 ENCOUNTER — Other Ambulatory Visit: Payer: Self-pay

## 2022-02-20 DIAGNOSIS — M79601 Pain in right arm: Secondary | ICD-10-CM | POA: Diagnosis not present

## 2022-02-20 MED ORDER — GABAPENTIN 100 MG PO CAPS: 100.0000 mg | ORAL_CAPSULE | Freq: Three times a day (TID) | ORAL | 0 refills | Status: DC

## 2022-02-20 NOTE — Progress Notes (Signed)
Office Visit Note   Patient: Ellen Henry           Date of Birth: Sep 23, 1946           MRN: [id number] Visit Date: 02/20/2022 Requested by: Ellen Batman, MD [address] [place],  [place] [address] PCP: Ellen Rakers, MD  Subjective: Chief Complaint  Patient presents with   Right Shoulder - Pain    HPI: Ellen Henry is a 76 year old patient with right shoulder arm and neck pain.  The pain does radiate down to the hand with some numbness and tingling.  She does have a history of prior rotator cuff tear repair.  Pain never really goes away and does hurt her at night.  She has been taking tramadol and Neurontin for the problem.  Since she was last seen she has had a CT scan which shows at least moderate glenohumeral arthritis with adequate bone stock and evidence of prior rotator cuff tear repair.  On my review of the images there is some superior migration of the humeral head.  Her pain has been worse over the last few months.  The pain does wake her from sleep at night.  Reports radicular type symptoms involving the neck and arm.  Numbness and tingling in the right hand is also present along with neck pain.  Also describes scapular pain as well as biceps tendon pain.  Denies any decrease in range of motion.  Does get some relief with overhead placement of her arm.  Does have a history of a stroke.  Has left and left sided weakness from the stroke.              ROS: All systems reviewed are negative as they relate to the chief complaint within the history of present illness.  Patient denies  fevers or chills.   Assessment & Plan: Visit Diagnoses:  1. Right arm pain     Plan: Impression is right shoulder arthritis plus or minus right-sided radiculopathy.  I think she does have a component of rotator cuff arthropathy present and significant shoulder pain.  Reverse shoulder replacement is discussed with the patient today.  I think she also has signs and symptoms consistent with  radiculopathy.  Plain radiographs of the cervical spine look pretty reasonable.  However she is having pain relieved with overhead motion scapular pain as well as radicular pain numbness and tingling going down to the hand in the C6 distribution.  Refill Neurontin.  Needs MRI of the cervical spine to evaluate right-sided radiculopathy which has been ongoing for much longer than 6 weeks and has failed conservative management including medications such as Neurontin and Tylenol as well as home exercise program.  Follow-up after that MRI scan.  We will likely schedule shoulder replacement at that time as well.  Follow-Up Instructions: No follow-ups on file.   Orders:  Orders Placed This Encounter  Procedures   XR Cervical Spine 2 or 3 views   MR Cervical Spine w/o contrast   Meds ordered this encounter  Medications   gabapentin (NEURONTIN) 100 MG capsule    Sig: Take 1 capsule (100 mg total) by mouth 3 (three) times daily.    Dispense:  90 capsule    Refill:  0      Procedures: No procedures performed   Clinical Data: No additional findings.  Objective: Vital Signs: There were no vitals taken for this visit.  Physical Exam:   Constitutional: Patient appears well-developed HEENT:  Head: Normocephalic Eyes:EOM  are normal Neck: Normal range of motion Cardiovascular: Normal rate Pulmonary/chest: Effort normal Neurologic: Patient is alert Skin: Skin is warm Psychiatric: Patient has normal mood and affect   Ortho Exam: Ortho exam demonstrates good cervical spine range of motion.  Paresthesias present on the right at C5-6 compared to the left.  Rotator cuff strength is actually pretty reasonable to infraspinatus supraspinatus subscap muscle testing.  Passive range of motion is 40/80/150 on the right.  There is some grinding with internal and external rotation of the right arm consistent with arthritis and or rotator cuff pathology.  Reflexes symmetric bilateral biceps triceps.  No  Popeye deformity in that right arm.  Specialty Comments:  No specialty comments available.  Imaging: XR Cervical Spine 2 or 3 views  Result Date: 02/20/2022 AP lateral radiographs cervical spine reviewed.  Normal lordosis is present.  Joint spaces well-maintained.  Minimal to no facet arthritis is present.  No acute fracture or spondylolisthesis.    PMFS History: Patient Active Problem List   Diagnosis Date Noted   History of laparoscopic cholecystectomy 08/15/2021   History of cholecystitis 08/15/2021   Status post total hip replacement, right 06/13/2021   Primary osteoarthritis of right hip 06/11/2021   Status post total replacement of right hip 06/11/2021   Complete tear of right rotator cuff 01/18/2018   Impingement syndrome of right shoulder 01/18/2018   AC (acromioclavicular) arthritis 01/18/2018   Rotator cuff tear 01/18/2018   Low oxygen saturation 12/09/2017   Chronic constipation    Failed back syndrome    Chronic pain syndrome    Radicular pain    Chronic bilateral low back pain without sciatica    Dysphagia, post-stroke    Left hemiparesis ([facility])    Acute ischemic stroke ([facility]) 12/15/2016   Urinary retention 02/25/2015   Primary osteoarthritis of left knee 02/15/2015   Morbid obesity ([facility]) 02/15/2015   Essential hypertension, benign 09/07/2014   Hyperlipidemia 09/05/2014   Gout 09/05/2014   Neuropathy 09/05/2014   Radiculopathy 08/29/2014   Past Medical History:  Diagnosis Date   Acute kidney failure due to procedure    Arthritis    Complication of anesthesia    after nerve block difficult breathing ([facility])   Dry skin    GERD (gastroesophageal reflux disease)    "years ago", diet controlled   Gout    H/O pyelonephritis    as a child   Heart murmur    since birth, denies any problems    History of sepsis    after spinal surgery   Hypertension    Impingement syndrome of right shoulder    Neuropathy     Osteoarthritis of AC (acromioclavicular) joint    Right   Pneumonia    Radiculopathy    Rotator cuff tear, right    Sleep apnea    does not use cpap   Stroke ([facility]) 12/15/2016   left side weakness    Family History  Problem Relation Age of Onset   Varicose Veins Mother     Past Surgical History:  Procedure Laterality Date   ABDOMINAL EXPOSURE N/A 08/29/2014   Procedure: ABDOMINAL EXPOSURE;  Surgeon: Larina Earthly, MD;  [facility];  Service: Vascular;  Laterality: N/A;   ABDOMINAL HYSTERECTOMY     ANTERIOR LUMBAR FUSION N/A 08/29/2014   Procedure: ANTERIOR LUMBAR FUSION 1 LEVEL;  Surgeon: Emilee Hero, MD;  [facility];  Service: Orthopedics;  Laterality: N/A;  Lumbar 4-5  anterior lumbar interbody fusion with allograft and instrumentation.   APPENDECTOMY     BACK SURGERY  08/28/2014 and 08/29/2014   lumbar fusion   CARPAL TUNNEL RELEASE Right    release done twice   CHOLECYSTECTOMY N/A 08/12/2021   Procedure: LAPAROSCOPIC CHOLECYSTECTOMY;  Surgeon: Diamantina Monks, MD;  Location[facility];  Service: General;  Laterality: N/A;   COLONOSCOPY     JOINT REPLACEMENT     left knee replacement     lower intestine removal     REPLACEMENT TOTAL KNEE  2010   rigth knee   SHOULDER ARTHROSCOPY Right 01/18/2018   Procedure: RIGHT SHOULDER ARTHROSCOPY, SUBACROMIAL DECOMPRESSION, DISTAL CLAVICLE RESECTION WITH MINI OPEN ROTATOR CUFF REPAIR;  Surgeon: Ellen Batman, MD;  [facility];  Service: Orthopedics;  Laterality: Right;   TONSILLECTOMY     TOTAL HIP ARTHROPLASTY Right 06/11/2021   Procedure: TOTAL HIP ARTHROPLASTY ANTERIOR APPROACH;  Surgeon: Jodi Geralds, MD;  [facility];  Service: Orthopedics;  Laterality: Right;   TOTAL KNEE ARTHROPLASTY Left 02/15/2015   Procedure: TOTAL KNEE ARTHROPLASTY;  Surgeon: Jodi Geralds, MD;  [facility];  Service: Orthopedics;  Laterality: Left;   Social History   Occupational History   Not on file  Tobacco Use   Smoking  status: Former    Packs/day: 0.50    Types: Cigarettes    Quit date: 04/04/2021    Years since quitting: 0.8   Smokeless tobacco: Never  Vaping Use   Vaping Use: Never used  Substance and Sexual Activity   Alcohol use: No   Drug use: No    Comment: sometimes will take a vicodin from friend   Sexual activity: Never

## 2022-02-24 ENCOUNTER — Ambulatory Visit: Payer: Medicare Other | Admitting: Physical Therapy

## 2022-02-26 ENCOUNTER — Ambulatory Visit: Payer: Medicare Other | Admitting: Physical Therapy

## 2022-03-03 ENCOUNTER — Ambulatory Visit: Payer: Medicare Other | Admitting: Rehabilitation

## 2022-03-04 ENCOUNTER — Ambulatory Visit
Admission: RE | Admit: 2022-03-04 | Discharge: 2022-03-04 | Disposition: A | Discharge status: Home / Self Care | Payer: Medicare Other | Source: Ambulatory Visit | Attending: Orthopedic Surgery | Admitting: Orthopedic Surgery

## 2022-03-04 DIAGNOSIS — M47812 Spondylosis without myelopathy or radiculopathy, cervical region: Secondary | ICD-10-CM | POA: Diagnosis not present

## 2022-03-04 DIAGNOSIS — M79601 Pain in right arm: Secondary | ICD-10-CM

## 2022-03-05 ENCOUNTER — Ambulatory Visit: Payer: Medicare Other | Admitting: Rehabilitation

## 2022-03-05 ENCOUNTER — Telehealth: Payer: Self-pay

## 2022-03-05 NOTE — Telephone Encounter (Signed)
Patient needs appt with Dr Marlou Sa to review MRI ?

## 2022-03-05 NOTE — Telephone Encounter (Signed)
-----   Message from Meredith Pel, MD sent at 03/05/2022  7:41 AM EDT ----- ?Can you arrange for f/u thx ?

## 2022-03-05 NOTE — Telephone Encounter (Signed)
Appt schedule for  4-17 @ 1:45 ?

## 2022-03-05 NOTE — Progress Notes (Signed)
Can you arrange for f/u thx

## 2022-03-10 ENCOUNTER — Ambulatory Visit: Payer: Medicare Other | Admitting: Physical Therapy

## 2022-03-12 ENCOUNTER — Ambulatory Visit: Payer: Medicare Other | Admitting: Physical Therapy

## 2022-03-18 ENCOUNTER — Telehealth: Payer: Self-pay

## 2022-03-18 NOTE — Telephone Encounter (Signed)
Patient requesting a refill on tramadol. Dr Durward Fortes is asking for Dr Dean/Luke to take over this for patient.  ?

## 2022-03-20 ENCOUNTER — Other Ambulatory Visit: Payer: Self-pay | Admitting: Surgical

## 2022-03-20 MED ORDER — TRAMADOL HCL 50 MG PO TABS: 50.0000 mg | ORAL_TABLET | Freq: Two times a day (BID) | ORAL | 0 refills | Status: DC | PRN

## 2022-03-20 NOTE — Telephone Encounter (Signed)
Sent in refill

## 2022-03-23 ENCOUNTER — Ambulatory Visit: Payer: Medicare Other | Admitting: Orthopedic Surgery

## 2022-03-23 ENCOUNTER — Ambulatory Visit (INDEPENDENT_AMBULATORY_CARE_PROVIDER_SITE_OTHER): Payer: Medicare Other

## 2022-03-23 ENCOUNTER — Ambulatory Visit: Payer: Self-pay

## 2022-03-23 DIAGNOSIS — R911 Solitary pulmonary nodule: Secondary | ICD-10-CM

## 2022-03-23 DIAGNOSIS — M19011 Primary osteoarthritis, right shoulder: Secondary | ICD-10-CM | POA: Diagnosis not present

## 2022-03-23 DIAGNOSIS — M25562 Pain in left knee: Secondary | ICD-10-CM

## 2022-03-23 DIAGNOSIS — M25561 Pain in right knee: Secondary | ICD-10-CM

## 2022-03-25 ENCOUNTER — Encounter: Payer: Self-pay | Admitting: Orthopedic Surgery

## 2022-03-25 MED ORDER — METHYLPREDNISOLONE ACETATE 40 MG/ML IJ SUSP
40.0000 mg | INTRAMUSCULAR | Status: AC | PRN
  Administered 2022-03-23: 40 mg via INTRA_ARTICULAR

## 2022-03-25 MED ORDER — BUPIVACAINE HCL 0.5 % IJ SOLN
9.0000 mL | INTRAMUSCULAR | Status: AC | PRN
  Administered 2022-03-23: 9 mL via INTRA_ARTICULAR

## 2022-03-25 MED ORDER — LIDOCAINE HCL 1 % IJ SOLN
5.0000 mL | INTRAMUSCULAR | Status: AC | PRN
  Administered 2022-03-23: 5 mL

## 2022-03-25 NOTE — Progress Notes (Signed)
? ?Office Visit Note ?  ?Patient: Ellen Henry           ?Date of Birth: [birth year]           ?MRN: [id number] ?Visit Date: 03/23/2022 ?Requested by: Lucianne Lei, MD ?[address] ?[address] ?[place],  [place] [address] ?PCP: Lucianne Lei, MD ? ?Subjective: ?Chief Complaint  ?Patient presents with  ? Right Knee - Pain  ? Left Knee - Pain  ? Other  ?   ?Cervical spine scan review  ? ? ?HPI: Ellen Henry is a 76 year old patient with known history of right shoulder arthritis.  Here to review scan of the neck because of some radicular symptoms.  She still reports right arm pain radiating down into the hand.  Has a history of 2 carpal tunnel releases in 1970 as well as 1997.  MRI scan of the cervical spine demonstrates less than typical cervical spine degeneration for age with no neural impingement.  Patient still is having significant right shoulder pain and has a known history of arthritis.  She is ambulating with a walker. ? ?Patient also reports having a fall this morning when trying to get into the shower.  She has a history of bilateral knee replacements.  Has had bilateral knee pain since that time. ?             ?ROS:All systems reviewed are negative as they relate to the chief complaint within the history of present illness.  Patient denies  fevers or chills. ? ? ?Assessment & Plan: ?Visit Diagnoses:  ?1. Pain in both knees, unspecified chronicity   ? ? ?Plan: Impression is fall this morning with no evidence of injury to either knee replacement.  No fracture with intact extensor mechanisms.  Regarding the shoulder I think that she definitely has arm pain from shoulder arthritis between the shoulder and the elbow.  The rest of her radicular type symptoms may be recurrent nerve compression from carpal tunnel syndrome.  Does not look like there is any nerve impingement in this neck.  Talked about shoulder replacement and she is not quite ready for that yet but we will inject the shoulder joint today.  Come back in 2 months  for clinical recheck.  I think she may be ready to consider shoulder replacement at that time.  She also was noted to have a nodule on CT scan in the right upper lobe.  There requested follow-up CT scan in 4 to 6 months which we will do in July and have her see her primary care provider after that. ? ?Follow-Up Instructions: Return in about 8 weeks (around 05/18/2022).  ? ?Orders:  ?Orders Placed This Encounter  ?Procedures  ? XR Knee 1-2 Views Right  ? XR KNEE 3 VIEW LEFT  ? ?No orders of the defined types were placed in this encounter. ? ? ? ? Procedures: ?Large Joint Inj: R glenohumeral on 03/23/2022 7:35 AM ?Indications: diagnostic evaluation and pain ?Details: 18 G 1.5 in needle, posterior approach ? ?Arthrogram: No ? ?Medications: 9 mL bupivacaine 0.5 %; 40 mg methylPREDNISolone acetate 40 MG/ML; 5 mL lidocaine 1 % ?Outcome: tolerated well, no immediate complications ?Procedure, treatment alternatives, risks and benefits explained, specific risks discussed. Consent was given by the patient. Immediately prior to procedure a time out was called to verify the correct patient, procedure, equipment, support staff and site/side marked as required. Patient was prepped and draped in the usual sterile fashion.  ? ? ? ? ?Clinical Data: ?No  additional findings. ? ?Objective: ?Vital Signs: There were no vitals taken for this visit. ? ?Physical Exam:  ? ?Constitutional: Patient appears well-developed ?HEENT:  ?Head: Normocephalic ?Eyes:EOM are normal ?Neck: Normal range of motion ?Cardiovascular: Normal rate ?Pulmonary/chest: Effort normal ?Neurologic: Patient is alert ?Skin: Skin is warm ?Psychiatric: Patient has normal mood and affect ? ? ?Ortho Exam: Ortho exam demonstrates extensor mechanism intact in both knees.  Collaterals are stable to varus and valgus stress at 0 and 30 degrees bilaterally.  No knee effusion.  Patient has palpable pedal pulses and intact ankle dorsiflexion bilaterally.  Right shoulder has painful  range of motion which is restricted consistent with her known diagnosis of arthritis.  Cervical spine range of motion intact. ? ?Specialty Comments:  ?No specialty comments available. ? ?Imaging: ?No results found. ? ? ?PMFS History: ?Patient Active Problem List  ? Diagnosis Date Noted  ? History of laparoscopic cholecystectomy 08/15/2021  ? History of cholecystitis 08/15/2021  ? Status post total hip replacement, right 06/13/2021  ? Primary osteoarthritis of right hip 06/11/2021  ? Status post total replacement of right hip 06/11/2021  ? Complete tear of right rotator cuff 01/18/2018  ? Impingement syndrome of right shoulder 01/18/2018  ? AC (acromioclavicular) arthritis 01/18/2018  ? Rotator cuff tear 01/18/2018  ? Low oxygen saturation 12/09/2017  ? Chronic constipation   ? Failed back syndrome   ? Chronic pain syndrome   ? Radicular pain   ? Chronic bilateral low back pain without sciatica   ? Dysphagia, post-stroke   ? Left hemiparesis ([place])   ? Acute ischemic stroke ([place]) 12/15/2016  ? Urinary retention 02/25/2015  ? Primary osteoarthritis of left knee 02/15/2015  ? Morbid obesity ([place]) 02/15/2015  ? Essential hypertension, benign 09/07/2014  ? Hyperlipidemia 09/05/2014  ? Gout 09/05/2014  ? Neuropathy 09/05/2014  ? Radiculopathy 08/29/2014  ? ?Past Medical History:  ?Diagnosis Date  ? Acute kidney failure due to procedure   ? Arthritis   ? Complication of anesthesia   ? after nerve block difficult breathing (at [place] [facility] [address] transported to [facility])  ? Dry skin   ? GERD (gastroesophageal reflux disease)   ? "years ago", diet controlled  ? Gout   ? H/O pyelonephritis   ? as a child  ? Heart murmur   ? since birth, denies any problems   ? History of sepsis   ? after spinal surgery  ? Hypertension   ? Impingement syndrome of right shoulder   ? Neuropathy   ? Osteoarthritis of AC (acromioclavicular) joint   ? Right  ? Pneumonia   ? Radiculopathy   ? Rotator cuff tear, right   ? Sleep apnea   ? does  not use cpap  ? Stroke ([place]) 12/15/2016  ? left side weakness  ?  ?Family History  ?Problem Relation Age of Onset  ? Varicose Veins Mother   ?  ?Past Surgical History:  ?Procedure Laterality Date  ? ABDOMINAL EXPOSURE N/A 08/29/2014  ? Procedure: ABDOMINAL EXPOSURE;  Surgeon: Rosetta Posner, MD;  Location: [place];  Service: Vascular;  Laterality: N/A;  ? ABDOMINAL HYSTERECTOMY    ? ANTERIOR LUMBAR FUSION N/A 08/29/2014  ? Procedure: ANTERIOR LUMBAR FUSION 1 LEVEL;  Surgeon: Sinclair Ship, MD;  Location: [facility];  Service: Orthopedics;  Laterality: N/A;  Lumbar 4-5 anterior lumbar interbody fusion with allograft and instrumentation.  ? APPENDECTOMY    ? BACK SURGERY  08/28/2014 and 08/29/2014  ? lumbar fusion  ?  CARPAL TUNNEL RELEASE Right   ? release done twice  ? CHOLECYSTECTOMY N/A 08/12/2021  ? Procedure: LAPAROSCOPIC CHOLECYSTECTOMY;  Surgeon: Jesusita Oka, MD;  Location: [facility];  Service: General;  Laterality: N/A;  ? COLONOSCOPY    ? JOINT REPLACEMENT    ? left knee replacement    ? lower intestine removal    ? REPLACEMENT TOTAL KNEE  2010  ? rigth knee  ? SHOULDER ARTHROSCOPY Right 01/18/2018  ? Procedure: RIGHT SHOULDER ARTHROSCOPY, SUBACROMIAL DECOMPRESSION, DISTAL CLAVICLE RESECTION WITH MINI OPEN ROTATOR CUFF REPAIR;  Surgeon: Garald Balding, MD;  Location: [facility];  Service: Orthopedics;  Laterality: Right;  ? TONSILLECTOMY    ? TOTAL HIP ARTHROPLASTY Right 06/11/2021  ? Procedure: TOTAL HIP ARTHROPLASTY ANTERIOR APPROACH;  Surgeon: [name], MD;  Location: [facility];  Service: Orthopedics;  Laterality: Right;  ? TOTAL KNEE ARTHROPLASTY Left 02/15/2015  ? Procedure: TOTAL KNEE ARTHROPLASTY;  Surgeon: [name], MD;  Location: [facility];  Service: Orthopedics;  Laterality: Left;  ? ?Social History  ? ?Occupational History  ? Not on file  ?Tobacco Use  ? Smoking status: Former  ?  Packs/day: 0.50  ?  Types: Cigarettes  ?  Quit date: 04/04/2021  ?  Years since quitting: 0.9  ? Smokeless tobacco: Never   ?Vaping Use  ? Vaping Use: Never used  ?Substance and Sexual Activity  ? Alcohol use: No  ? Drug use: No  ?  Comment: sometimes will take a vicodin from friend  ? Sexual activity: Never  ? ? ? ? ? ?

## 2022-03-26 DIAGNOSIS — E785 Hyperlipidemia, unspecified: Secondary | ICD-10-CM | POA: Diagnosis not present

## 2022-03-26 DIAGNOSIS — I10 Essential (primary) hypertension: Secondary | ICD-10-CM | POA: Diagnosis not present

## 2022-03-26 DIAGNOSIS — E1169 Type 2 diabetes mellitus with other specified complication: Secondary | ICD-10-CM | POA: Diagnosis not present

## 2022-03-26 NOTE — Addendum Note (Signed)
Addended byLaurann Montana on: 03/26/2022 11:24 AM ? ? Modules accepted: Orders ? ?

## 2022-04-16 DIAGNOSIS — E785 Hyperlipidemia, unspecified: Secondary | ICD-10-CM | POA: Diagnosis not present

## 2022-04-16 DIAGNOSIS — Z0000 Encounter for general adult medical examination without abnormal findings: Secondary | ICD-10-CM | POA: Diagnosis not present

## 2022-04-16 DIAGNOSIS — R600 Localized edema: Secondary | ICD-10-CM | POA: Diagnosis not present

## 2022-04-16 DIAGNOSIS — M130 Polyarthritis, unspecified: Secondary | ICD-10-CM | POA: Diagnosis not present

## 2022-04-16 DIAGNOSIS — I501 Left ventricular failure: Secondary | ICD-10-CM | POA: Diagnosis not present

## 2022-04-16 DIAGNOSIS — I10 Essential (primary) hypertension: Secondary | ICD-10-CM | POA: Diagnosis not present

## 2022-04-22 ENCOUNTER — Other Ambulatory Visit: Payer: Medicare Other

## 2022-05-07 DIAGNOSIS — E1169 Type 2 diabetes mellitus with other specified complication: Secondary | ICD-10-CM | POA: Diagnosis not present

## 2022-05-07 DIAGNOSIS — I10 Essential (primary) hypertension: Secondary | ICD-10-CM | POA: Diagnosis not present

## 2022-05-28 DIAGNOSIS — I501 Left ventricular failure: Secondary | ICD-10-CM | POA: Diagnosis not present

## 2022-05-28 DIAGNOSIS — E785 Hyperlipidemia, unspecified: Secondary | ICD-10-CM | POA: Diagnosis not present

## 2022-05-28 DIAGNOSIS — G63 Polyneuropathy in diseases classified elsewhere: Secondary | ICD-10-CM | POA: Diagnosis not present

## 2022-06-10 ENCOUNTER — Ambulatory Visit
Admission: RE | Admit: 2022-06-10 | Discharge: 2022-06-10 | Disposition: A | Discharge status: Home / Self Care | Payer: Medicare Other | Source: Ambulatory Visit | Attending: Orthopedic Surgery | Admitting: Orthopedic Surgery

## 2022-06-10 DIAGNOSIS — R918 Other nonspecific abnormal finding of lung field: Secondary | ICD-10-CM | POA: Diagnosis not present

## 2022-06-10 DIAGNOSIS — R911 Solitary pulmonary nodule: Secondary | ICD-10-CM

## 2022-06-11 ENCOUNTER — Telehealth: Payer: Self-pay

## 2022-06-11 NOTE — Telephone Encounter (Addendum)
Patient would like a Rx for right shoulder pain sent to her pharmacy.  CB# [phone].  Please advise.  Thank you.

## 2022-06-12 ENCOUNTER — Other Ambulatory Visit: Payer: Self-pay | Admitting: Surgical

## 2022-06-12 ENCOUNTER — Other Ambulatory Visit: Payer: Self-pay

## 2022-06-12 MED ORDER — GABAPENTIN 100 MG PO CAPS
100.0000 mg | ORAL_CAPSULE | Freq: Three times a day (TID) | ORAL | 0 refills | Status: DC
Start: 2022-06-12 — End: 2023-03-03

## 2022-06-12 MED ORDER — GABAPENTIN 100 MG PO CAPS: 100.0000 mg | ORAL_CAPSULE | Freq: Three times a day (TID) | ORAL | 0 refills | Status: DC

## 2022-06-12 NOTE — Telephone Encounter (Signed)
Refill gabapentin which was sent in for her before at her last appointment.  She also needs a follow-up appointment to see Dr. Marlou Sa sometime this month based on her last office visit note

## 2022-06-13 NOTE — Progress Notes (Signed)
Can you refer this person to the primary care provider or whoever needs to follow-up this nodule deal thanks

## 2022-06-25 ENCOUNTER — Encounter: Payer: Self-pay | Admitting: Orthopedic Surgery

## 2022-06-25 ENCOUNTER — Ambulatory Visit: Payer: Medicare Other | Admitting: Surgical

## 2022-06-25 DIAGNOSIS — M19011 Primary osteoarthritis, right shoulder: Secondary | ICD-10-CM

## 2022-06-26 MED ORDER — TRAMADOL HCL 50 MG PO TABS: 50.0000 mg | ORAL_TABLET | Freq: Every evening | ORAL | 0 refills | Status: DC | PRN

## 2022-06-26 NOTE — Progress Notes (Signed)
This was discussed with her the other day at her appointment

## 2022-06-27 ENCOUNTER — Encounter: Payer: Self-pay | Admitting: Orthopedic Surgery

## 2022-06-27 NOTE — Progress Notes (Signed)
Office Visit Note   Patient: Ellen Henry           Date of Birth: January 28, 1946           MRN: [id number] Visit Date: 06/25/2022 Requested by: Lucianne Lei, MD [address] [place],  [place] [address] PCP: Lucianne Lei, MD  Subjective: Chief Complaint  Patient presents with   Right Shoulder - Pain    HPI: Ellen Henry is a 76 y.o. female who presents to the office complaining of right shoulder pain.  Patient returns following right shoulder glenohumeral injection.  She states that the injection helped for about 6 weeks but now her pain has returned to the point it was prior to the injection.  She is taking gabapentin, ibuprofen, occasional hydrocodone for pain control.  Pain keeps her up at night and she cannot really lay on her right shoulder.  She does state that she has pain that travels from her shoulder to her hand but all of this pain got better with the injection.  She is fed up with her current symptoms and she would like to pursue shoulder replacement.  She did have CT scan of the right shoulder that was done on 02/11/2022.  No history of diabetes.  She does smoke cigarettes on occasion but states this is only 2 to 3 cigarettes/week.  Does have a history of stroke in 2018 but does not take any blood thinners.  She does not have a cardiologist or neurologist.  She follows with her PCP who is Dr. Criss Rosales.  She has history of hip replacement and bilateral knee replacements by Dr. Berenice Primas.  No history of CKD..                ROS: All systems reviewed are negative as they relate to the chief complaint within the history of present illness.  Patient denies fevers or chills.  Assessment & Plan: Visit Diagnoses:  1. Arthritis of right shoulder region     Plan: Patient is a 76 year old female who presents for evaluation of right shoulder pain.  She has history of right shoulder osteoarthritis.  CT scan of the right shoulder was done on 02/11/2022.  She had previous injection that only  lasted for about 6 weeks and she would like definitive management of her arthritic pain.  She does have pain that travels from the shoulder down into her hand but states that all of this pain got better with the glenohumeral injection and she has had recent MRI of the cervical spine in March 2023 that demonstrated no significant neural impingement.  Discussed the risks and benefits of right shoulder replacement with Ellen Henry today which include but are not limited to the risk of nerve/blood vessel damage, shoulder stiffness, shoulder instability, need for revision surgery, medical complication from surgery such as stroke/MI/death/amputation, prosthetic joint infection.  After lengthy discussion of the recovery timeframe and the risks, patient would still like to proceed with surgery.  She will be posted for right shoulder reverse shoulder arthroplasty and follow-up with the office after procedure.  We also discussed the pulmonary nodule identified on her CT scan of the chest.  Recommended she discuss this with Dr. Criss Rosales for yearly monitoring of this nodule.  Follow-Up Instructions: No follow-ups on file.   Orders:  No orders of the defined types were placed in this encounter.  Meds ordered this encounter  Medications   traMADol (ULTRAM) 50 MG tablet    Sig: Take 1 tablet (  50 mg total) by mouth at bedtime as needed for severe pain.    Dispense:  30 tablet    Refill:  0      Procedures: No procedures performed   Clinical Data: No additional findings.  Objective: Vital Signs: There were no vitals taken for this visit.  Physical Exam:  Constitutional: Patient appears well-developed HEENT:  Head: Normocephalic Eyes:EOM are normal Neck: Normal range of motion Cardiovascular: Normal rate Pulmonary/chest: Effort normal Neurologic: Patient is alert Skin: Skin is warm Psychiatric: Patient has normal mood and affect  Ortho Exam: Ortho exam demonstrates right shoulder with round 35 degrees  external rotation, 80 degrees abduction, 150 degrees forward flexion.  Coarse grinding with passive range of motion of the shoulder consistent with arthritis.  Decent rotator cuff strength of supra, infra, subscap with no discernible weakness.  Axillary nerve is intact with deltoid firing.  Specialty Comments:  No specialty comments available.  Imaging: No results found.   PMFS History: Patient Active Problem List   Diagnosis Date Noted   History of laparoscopic cholecystectomy 08/15/2021   History of cholecystitis 08/15/2021   Status post total hip replacement, right 06/13/2021   Primary osteoarthritis of right hip 06/11/2021   Status post total replacement of right hip 06/11/2021   Complete tear of right rotator cuff 01/18/2018   Impingement syndrome of right shoulder 01/18/2018   AC (acromioclavicular) arthritis 01/18/2018   Rotator cuff tear 01/18/2018   Low oxygen saturation 12/09/2017   Chronic constipation    Failed back syndrome    Chronic pain syndrome    Radicular pain    Chronic bilateral low back pain without sciatica    Dysphagia, post-stroke    Left hemiparesis ([facility])    Acute ischemic stroke ([place]) 12/15/2016   Urinary retention 02/25/2015   Primary osteoarthritis of left knee 02/15/2015   Morbid obesity ([place]) 02/15/2015   Essential hypertension, benign 09/07/2014   Hyperlipidemia 09/05/2014   Gout 09/05/2014   Neuropathy 09/05/2014   Radiculopathy 08/29/2014   Past Medical History:  Diagnosis Date   Acute kidney failure due to procedure    Arthritis    Complication of anesthesia    after nerve block difficult breathing (at [place] [address] transported to [facility])   Dry skin    GERD (gastroesophageal reflux disease)    "years ago", diet controlled   Gout    H/O pyelonephritis    as a child   Heart murmur    since birth, denies any problems    History of sepsis    after spinal surgery   Hypertension    Impingement syndrome of right  shoulder    Neuropathy    Osteoarthritis of AC (acromioclavicular) joint    Right   Pneumonia    Radiculopathy    Rotator cuff tear, right    Sleep apnea    does not use cpap   Stroke ([place]) 12/15/2016   left side weakness    Family History  Problem Relation Age of Onset   Varicose Veins Mother     Past Surgical History:  Procedure Laterality Date   ABDOMINAL EXPOSURE N/A 08/29/2014   Procedure: ABDOMINAL EXPOSURE;  Surgeon: Rosetta Posner, MD;  Location: [facility];  Service: Vascular;  Laterality: N/A;   ABDOMINAL HYSTERECTOMY     ANTERIOR LUMBAR FUSION N/A 08/29/2014   Procedure: ANTERIOR LUMBAR FUSION 1 LEVEL;  Surgeon: Sinclair Ship, MD;  Location: [facility];  Service: Orthopedics;  Laterality: N/A;  Lumbar 4-5 anterior  lumbar interbody fusion with allograft and instrumentation.   APPENDECTOMY     BACK SURGERY  08/28/2014 and 08/29/2014   lumbar fusion   CARPAL TUNNEL RELEASE Right    release done twice   CHOLECYSTECTOMY N/A 08/12/2021   Procedure: LAPAROSCOPIC CHOLECYSTECTOMY;  Surgeon: Jesusita Oka, MD;  Location: [facility];  Service: General;  Laterality: N/A;   COLONOSCOPY     JOINT REPLACEMENT     left knee replacement     lower intestine removal     REPLACEMENT TOTAL KNEE  2010   rigth knee   SHOULDER ARTHROSCOPY Right 01/18/2018   Procedure: RIGHT SHOULDER ARTHROSCOPY, SUBACROMIAL DECOMPRESSION, DISTAL CLAVICLE RESECTION WITH MINI OPEN ROTATOR CUFF REPAIR;  Surgeon: Garald Balding, MD;  Location: [facility];  Service: Orthopedics;  Laterality: Right;   TONSILLECTOMY     TOTAL HIP ARTHROPLASTY Right 06/11/2021   Procedure: TOTAL HIP ARTHROPLASTY ANTERIOR APPROACH;  Surgeon: [name], MD;  [facility];  Service: Orthopedics;  Laterality: Right;   TOTAL KNEE ARTHROPLASTY Left 02/15/2015   Procedure: TOTAL KNEE ARTHROPLASTY;  Surgeon: [name], MD;  Location: [facility];  Service: Orthopedics;  Laterality: Left;   Social History   Occupational History   Not on file   Tobacco Use   Smoking status: Former    Packs/day: 0.50    Types: Cigarettes    Quit date: 04/04/2021    Years since quitting: 1.2   Smokeless tobacco: Never  Vaping Use   Vaping Use: Never used  Substance and Sexual Activity   Alcohol use: No   Drug use: No    Comment: sometimes will take a vicodin from friend   Sexual activity: Never

## 2022-07-21 ENCOUNTER — Telehealth: Payer: Self-pay | Admitting: Orthopedic Surgery

## 2022-07-21 NOTE — Telephone Encounter (Signed)
Patient is scheduled for right reverse shoulder arthroplasty on 08-25-22 at 7:30am.  She would like to know if Dr Marlou Sa is going to remove the cyst that is on her back whenever she is having the shoulder replacement done.    Patient can be reached at [phone].  If the surgery procedure is altered, please let me know the codes for both diagnosis and procedure so authorization can be optained .

## 2022-07-23 DIAGNOSIS — I6319 Cerebral infarction due to embolism of other precerebral artery: Secondary | ICD-10-CM | POA: Diagnosis not present

## 2022-07-23 DIAGNOSIS — E785 Hyperlipidemia, unspecified: Secondary | ICD-10-CM | POA: Diagnosis not present

## 2022-07-23 DIAGNOSIS — E1169 Type 2 diabetes mellitus with other specified complication: Secondary | ICD-10-CM | POA: Diagnosis not present

## 2022-07-23 DIAGNOSIS — M130 Polyarthritis, unspecified: Secondary | ICD-10-CM | POA: Diagnosis not present

## 2022-07-24 ENCOUNTER — Other Ambulatory Visit: Payer: Self-pay

## 2022-07-28 NOTE — Telephone Encounter (Signed)
I am not sure where the cyst is exactly based on my review of the notes.  Can you have her send a picture of exactly where that cyst is so I can let her know if we can take it out or not.  Really depends on whether or not it is in the surgical field.  Thanks

## 2022-07-29 NOTE — Telephone Encounter (Signed)
Tried calling again, no answer but was able to get her voicemail. Left message with detailed instructions per Dr Marlou Sa

## 2022-07-29 NOTE — Telephone Encounter (Signed)
Patient called in about message Lauren left and she appreciate the call and she understands.

## 2022-07-29 NOTE — Telephone Encounter (Signed)
She had cyst on the left upper back but surgeries on her right-hand side so  I do not thinkwe  would be able to be done at the same time

## 2022-07-29 NOTE — Telephone Encounter (Signed)
IC s/w patient. She does not have mychart-unable to send photo She said it is in her upper back-she said it was seen on a recent CT scan she had done?

## 2022-07-29 NOTE — Telephone Encounter (Signed)
Tried calling patient to advise. No answer.

## 2022-08-03 ENCOUNTER — Other Ambulatory Visit: Payer: Self-pay | Admitting: Surgical

## 2022-08-18 NOTE — Pre-Procedure Instructions (Addendum)
Surgical Instructions    Your procedure is scheduled on August 25, 2022.  Report to [facility] Main Entrance "A" at 5:30 A.M., then check in with the Admitting office.  Call this number if you have problems the morning of surgery:  [phone]   If you have any questions prior to your surgery date call [phone]: Open Monday-Friday 8am-4pm    Remember:  Do not eat after midnight the night before your surgery  You may drink clear liquids until 4:30 AM the morning of your surgery.   Clear liquids allowed are: Water, Non-Citrus Juices (without pulp), Carbonated Beverages, Clear Tea, Black Coffee Only (NO MILK, CREAM OR POWDERED CREAMER of any kind), and Gatorade.  Patient Instructions  The night before surgery:  No food after midnight. ONLY clear liquids after midnight  The day of surgery (if you do NOT have diabetes):  Drink ONE (1) Pre-Surgery Clear Ensure by 4:30 AM the morning of surgery. Drink in one sitting. Do not sip.  This drink was given to you during your hospital  pre-op appointment visit.  Nothing else to drink after completing the  Pre-Surgery Clear Ensure.         If you have questions, please contact your surgeon's office.     Take these medicines the morning of surgery with A SIP OF WATER:  gabapentin (NEURONTIN)    Take these medicines the morning of surgery AS NEEDED:  acetaminophen (TYLENOL)  docusate sodium (COLACE)  methocarbamol (ROBAXIN)    Follow your surgeon's instructions on when to stop Aspirin.  If no instructions were given by your surgeon then you will need to call the office to get those instructions.     As of today, STOP taking any Aleve, Naproxen, Ibuprofen, Motrin, Advil, Goody's, BC's, all herbal medications, fish oil, and all vitamins. This includes your medication: diclofenac Sodium (VOLTAREN) 1 % GEL   HOW TO MANAGE YOUR DIABETES BEFORE AND AFTER SURGERY  Why is it important to control my blood sugar before and after  surgery? Improving blood sugar levels before and after surgery helps healing and can limit problems. A way of improving blood sugar control is eating a healthy diet by:  Eating less sugar and carbohydrates  Increasing activity/exercise  Talking with your doctor about reaching your blood sugar goals High blood sugars (greater than 180 mg/dL) can raise your risk of infections and slow your recovery, so you will need to focus on controlling your diabetes during the weeks before surgery. Make sure that the doctor who takes care of your diabetes knows about your planned surgery including the date and location.  How do I manage my blood sugar before surgery? Check your blood sugar at least 4 times a day, starting 2 days before surgery, to make sure that the level is not too high or low.  Check your blood sugar the morning of your surgery when you wake up and every 2 hours until you get to the Short Stay unit.  If your blood sugar is less than 70 mg/dL, you will need to treat for low blood sugar: Do not take insulin. Treat a low blood sugar (less than 70 mg/dL) with  cup of clear juice (cranberry or apple), 4 glucose tablets, OR glucose gel. Recheck blood sugar in 15 minutes after treatment (to make sure it is greater than 70 mg/dL). If your blood sugar is not greater than 70 mg/dL on recheck, call [phone] for further instructions. Report your blood sugar to the short stay  nurse when you get to Short Stay.  If you are admitted to the hospital after surgery: Your blood sugar will be checked by the staff and you will probably be given insulin after surgery (instead of oral diabetes medicines) to make sure you have good blood sugar levels. The goal for blood sugar control after surgery is 80-180 mg/dL.                      Do NOT Smoke (Tobacco/Vaping) for 24 hours prior to your procedure.  If you use a CPAP at night, you may bring your mask/headgear for your overnight stay.   Contacts,  glasses, piercing's, hearing aid's, dentures or partials may not be worn into surgery, please bring cases for these belongings.    For patients admitted to the hospital, discharge time will be determined by your treatment team.   Patients discharged the day of surgery will not be allowed to drive home, and someone needs to stay with them for 24 hours.  SURGICAL WAITING ROOM VISITATION Patients having surgery or a procedure may have no more than 2 support people in the waiting area - these visitors may rotate.   Children under the age of 66 must have an adult with them who is not the patient. If the patient needs to stay at the hospital during part of their recovery, the visitor guidelines for inpatient rooms apply. Pre-op nurse will coordinate an appropriate time for 1 support person to accompany patient in pre-op.  This support person may not rotate.   Please refer to the [facility] website for the visitor guidelines for Inpatients (after your surgery is over and you are in a regular room).    Oral Hygiene is also important to reduce your risk of infection.  Remember - BRUSH YOUR TEETH THE MORNING OF SURGERY WITH YOUR REGULAR TOOTHPASTE  [place]- Preparing for Total Shoulder Arthroplasty  Before surgery, you can play an important role. Because skin is not sterile, your skin needs to be as free of germs as possible. You can reduce the number of germs on your skin by using the following products.   Benzoyl Peroxide Gel  o Reduces the number of germs present on the skin  o Applied twice a day to shoulder area starting two days before surgery   Chlorhexidine Gluconate (CHG) Soap (instructions listed above on how to wash with CHG Soap)  o An antiseptic cleaner that kills germs and bonds with the skin to continue killing germs even after washing  o Used for showering the night before surgery and morning of  surgery   ==================================================================  Please follow these instructions carefully:  BENZOYL PEROXIDE 5% GEL  Please do not use if you have an allergy to benzoyl peroxide. If your skin becomes reddened/irritated stop using the benzoyl peroxide.  Starting two days before surgery, apply as follows:  1. Apply benzoyl peroxide in the morning and at night. Apply after taking a shower. If you are not taking a shower clean entire shoulder front, back, and side along with the armpit with a clean wet washcloth.  2. Place a quarter-sized dollop on your SHOULDER and rub in thoroughly, making sure to cover the front, back, and side of your shoulder, along with the armpit.   2 Days prior to Surgery First Dose on 9/17 Morning Second Dose on 9/17 Night  Day Before Surgery First Dose on 9/18 Morning Night before surgery wash (entire body except face and private areas) with  CHG Soap THEN Second Dose on 9/18 Night   Morning of Surgery  wash BODY AGAIN with CHG Soap   4. Do NOT apply benzoyl peroxide gel on the day of surgery   [place]- Preparing For Surgery  Before surgery, you can play an important role. Because skin is not sterile, your skin needs to be as free of germs as possible. You can reduce the number of germs on your skin by washing with CHG (chlorahexidine gluconate) Soap before surgery.  CHG is an antiseptic cleaner which kills germs and bonds with the skin to continue killing germs even after washing.     Please do not use if you have an allergy to CHG or antibacterial soaps. If your skin becomes reddened/irritated stop using the CHG.  Do not shave (including legs and underarms) for at least 48 hours prior to first CHG shower. It is OK to shave your face.  Please follow these instructions carefully.     Shower the NIGHT BEFORE SURGERY and the MORNING OF SURGERY with CHG Soap.   If you chose to wash your hair, wash your hair first as  usual with your normal shampoo. After you shampoo, rinse your hair and body thoroughly to remove the shampoo.  Then [employer] and genitals (private parts) with your normal soap and rinse thoroughly to remove soap.  After that Use CHG Soap as you would any other liquid soap. You can apply CHG directly to the skin and wash gently with a scrungie or a clean washcloth.   Apply the CHG Soap to your body ONLY FROM THE NECK DOWN.  Do not use on open wounds or open sores. Avoid contact with your eyes, ears, mouth and genitals (private parts). Wash Face and genitals (private parts)  with your normal soap.   Wash thoroughly, paying special attention to the area where your surgery will be performed.  Thoroughly rinse your body with warm water from the neck down.  DO NOT shower/wash with your normal soap after using and rinsing off the CHG Soap.  Pat yourself dry with a CLEAN TOWEL.  8. Apply the Benzoyl Peroxide only the night before surgery.  Do Not use it the morning of surgery.  Wear CLEAN PAJAMAS to bed the night before surgery  Place CLEAN SHEETS on your bed the night before your surgery  DO NOT SLEEP WITH PETS.   Day of Surgery: Take a shower with CHG soap. Do not wear jewelry or makeup Do not wear lotions, powders, perfumes, or deodorant. Do not shave 48 hours prior to surgery.   Do not bring valuables to the hospital.  [facility] is not responsible for any belongings or valuables. Do not wear nail polish, gel polish, artificial nails, or any other type of covering on natural nails (fingers and toes) If you have artificial nails or gel coating that need to be removed by a nail salon, please have this removed prior to surgery. Artificial nails or gel coating may interfere with anesthesia's ability to adequately monitor your vital signs.  Wear Clean/Comfortable clothing the morning of surgery Remember to brush your teeth WITH YOUR REGULAR TOOTHPASTE.    Please read over the following  fact sheets that you were given.    If you received a COVID test during your pre-op visit  it is requested that you wear a mask when out in public, stay away from anyone that may not be feeling well and notify your surgeon if you develop symptoms. If  you have been in contact with anyone that has tested positive in the last 10 days please notify you surgeon.

## 2022-08-19 ENCOUNTER — Other Ambulatory Visit: Payer: Self-pay

## 2022-08-19 ENCOUNTER — Encounter (HOSPITAL_COMMUNITY)
Admission: RE | Admit: 2022-08-19 | Discharge: 2022-08-19 | Disposition: A | Discharge status: Home / Self Care | Payer: Medicare Other | Source: Ambulatory Visit | Attending: Orthopedic Surgery | Admitting: Orthopedic Surgery

## 2022-08-19 ENCOUNTER — Encounter (HOSPITAL_COMMUNITY): Payer: Self-pay

## 2022-08-19 VITALS — BP 130/79 | HR 86 | Temp 98.2°F | Resp 17 | Ht 60.0 in | Wt 211.0 lb

## 2022-08-19 DIAGNOSIS — Z01818 Encounter for other preprocedural examination: Secondary | ICD-10-CM | POA: Insufficient documentation

## 2022-08-19 LAB — URINALYSIS, ROUTINE W REFLEX MICROSCOPIC
Bilirubin Urine: NEGATIVE
Glucose, UA: NEGATIVE mg/dL
Hgb urine dipstick: NEGATIVE
Ketones, ur: NEGATIVE mg/dL
Leukocytes,Ua: NEGATIVE
Nitrite: NEGATIVE
Protein, ur: NEGATIVE mg/dL
Specific Gravity, Urine: 1.014 (ref 1.005–1.030)
pH: 5 (ref 5.0–8.0)

## 2022-08-19 LAB — CBC
HCT: 36.6 % (ref 36.0–46.0)
Hemoglobin: 12 g/dL (ref 12.0–15.0)
MCH: 30.2 pg (ref 26.0–34.0)
MCHC: 32.8 g/dL (ref 30.0–36.0)
MCV: 92.2 fL (ref 80.0–100.0)
Platelets: 212 10*3/uL (ref 150–400)
RBC: 3.97 MIL/uL (ref 3.87–5.11)
RDW: 13.4 % (ref 11.5–15.5)
WBC: 8.4 10*3/uL (ref 4.0–10.5)
nRBC: 0 % (ref 0.0–0.2)

## 2022-08-19 LAB — BASIC METABOLIC PANEL
Anion gap: 9 (ref 5–15)
BUN: 15 mg/dL (ref 8–23)
CO2: 21 mmol/L — ABNORMAL LOW (ref 22–32)
Calcium: 9.8 mg/dL (ref 8.9–10.3)
Chloride: 111 mmol/L (ref 98–111)
Creatinine, Ser: 0.86 mg/dL (ref 0.44–1.00)
GFR, Estimated: >60 mL/min (ref >60)
Glucose, Bld: 89 mg/dL (ref 70–99)
Potassium: 3.7 mmol/L (ref 3.5–5.1)
Sodium: 141 mmol/L (ref 135–145)

## 2022-08-19 LAB — SURGICAL PCR SCREEN
MRSA, PCR: NEGATIVE
Staphylococcus aureus: NEGATIVE

## 2022-08-19 NOTE — Pre-Procedure Instructions (Signed)
Surgical Instructions    Your procedure is scheduled on August 25, 2022.  Report to [facility] Main Entrance "A" at 5:30 A.M., then check in with the Admitting office.  Call this number if you have problems the morning of surgery:  [phone]   If you have any questions prior to your surgery date call [phone]: Open Monday-Friday 8am-4pm    Remember:  Do not eat after midnight the night before your surgery  You may drink clear liquids until 4:30 AM the morning of your surgery.   Clear liquids allowed are: Water, Non-Citrus Juices (without pulp), Carbonated Beverages, Clear Tea, Black Coffee Only (NO MILK, CREAM OR POWDERED CREAMER of any kind), and Gatorade.  Patient Instructions  The night before surgery:  No food after midnight. ONLY clear liquids after midnight  The day of surgery (if you do NOT have diabetes):  Drink ONE (1) Pre-Surgery Clear Ensure by 4:30 AM the morning of surgery. Drink in one sitting. Do not sip.  This drink was given to you during your hospital  pre-op appointment visit.  Nothing else to drink after completing the  Pre-Surgery Clear Ensure.         If you have questions, please contact your surgeon's office.     Take these medicines the morning of surgery with A SIP OF WATER:  gabapentin (NEURONTIN)    Take these medicines the morning of surgery AS NEEDED:  acetaminophen (TYLENOL)        Follow your surgeon's instructions on when to stop Aspirin.  If no instructions were given by your surgeon then you will need to call the office to get those instructions.     As of today, STOP taking any Aleve, Naproxen, Ibuprofen, Motrin, Advil, Goody's, BC's, all herbal medications, fish oil, and all vitamins. This includes your medication: diclofenac Sodium (VOLTAREN) 1 % GEL   HOW TO MANAGE YOUR DIABETES BEFORE AND AFTER SURGERY  Why is it important to control my blood sugar before and after surgery? Improving blood sugar levels before and  after surgery helps healing and can limit problems. A way of improving blood sugar control is eating a healthy diet by:  Eating less sugar and carbohydrates  Increasing activity/exercise  Talking with your doctor about reaching your blood sugar goals High blood sugars (greater than 180 mg/dL) can raise your risk of infections and slow your recovery, so you will need to focus on controlling your diabetes during the weeks before surgery. Make sure that the doctor who takes care of your diabetes knows about your planned surgery including the date and location.  How do I manage my blood sugar before surgery? Check your blood sugar at least 4 times a day, starting 2 days before surgery, to make sure that the level is not too high or low.  Check your blood sugar the morning of your surgery when you wake up and every 2 hours until you get to the Short Stay unit.  If your blood sugar is less than 70 mg/dL, you will need to treat for low blood sugar: Do not take insulin. Treat a low blood sugar (less than 70 mg/dL) with  cup of clear juice (cranberry or apple), 4 glucose tablets, OR glucose gel. Recheck blood sugar in 15 minutes after treatment (to make sure it is greater than 70 mg/dL). If your blood sugar is not greater than 70 mg/dL on recheck, call [phone] for further instructions. Report your blood sugar to the short stay nurse when you  get to Short Stay.  If you are admitted to the hospital after surgery: Your blood sugar will be checked by the staff and you will probably be given insulin after surgery (instead of oral diabetes medicines) to make sure you have good blood sugar levels. The goal for blood sugar control after surgery is 80-180 mg/dL.                      Do NOT Smoke (Tobacco/Vaping) for 24 hours prior to your procedure.  If you use a CPAP at night, you may bring your mask/headgear for your overnight stay.   Contacts, glasses, piercing's, hearing aid's, dentures or  partials may not be worn into surgery, please bring cases for these belongings.    For patients admitted to the hospital, discharge time will be determined by your treatment team.   Patients discharged the day of surgery will not be allowed to drive home, and someone needs to stay with them for 24 hours.  SURGICAL WAITING ROOM VISITATION Patients having surgery or a procedure may have no more than 2 support people in the waiting area - these visitors may rotate.   Children under the age of 78 must have an adult with them who is not the patient. If the patient needs to stay at the hospital during part of their recovery, the visitor guidelines for inpatient rooms apply. Pre-op nurse will coordinate an appropriate time for 1 support person to accompany patient in pre-op.  This support person may not rotate.   Please refer to the [facility] website for the visitor guidelines for Inpatients (after your surgery is over and you are in a regular room).    Oral Hygiene is also important to reduce your risk of infection.  Remember - BRUSH YOUR TEETH THE MORNING OF SURGERY WITH YOUR REGULAR TOOTHPASTE  [place]- Preparing for Total Shoulder Arthroplasty  Before surgery, you can play an important role. Because skin is not sterile, your skin needs to be as free of germs as possible. You can reduce the number of germs on your skin by using the following products.   Benzoyl Peroxide Gel  o Reduces the number of germs present on the skin  o Applied twice a day to shoulder area starting two days before surgery   Chlorhexidine Gluconate (CHG) Soap (instructions listed above on how to wash with CHG Soap)  o An antiseptic cleaner that kills germs and bonds with the skin to continue killing germs even after washing  o Used for showering the night before surgery and morning of surgery   ==================================================================  Please follow these instructions  carefully:  BENZOYL PEROXIDE 5% GEL  Please do not use if you have an allergy to benzoyl peroxide. If your skin becomes reddened/irritated stop using the benzoyl peroxide.  Starting two days before surgery, apply as follows:  1. Apply benzoyl peroxide in the morning and at night. Apply after taking a shower. If you are not taking a shower clean entire shoulder front, back, and side along with the armpit with a clean wet washcloth.  2. Place a quarter-sized dollop on your SHOULDER and rub in thoroughly, making sure to cover the front, back, and side of your shoulder, along with the armpit.   2 Days prior to Surgery First Dose on 9/17 Morning Second Dose on 9/17 Night  Day Before Surgery First Dose on 9/18 Morning Night before surgery wash (entire body except face and private areas) with CHG Soap THEN  Second Dose on 9/18 Night   Morning of Surgery  wash BODY AGAIN with CHG Soap   4. Do NOT apply benzoyl peroxide gel on the day of surgery   [place]- Preparing For Surgery  Before surgery, you can play an important role. Because skin is not sterile, your skin needs to be as free of germs as possible. You can reduce the number of germs on your skin by washing with CHG (chlorahexidine gluconate) Soap before surgery.  CHG is an antiseptic cleaner which kills germs and bonds with the skin to continue killing germs even after washing.     Please do not use if you have an allergy to CHG or antibacterial soaps. If your skin becomes reddened/irritated stop using the CHG.  Do not shave (including legs and underarms) for at least 48 hours prior to first CHG shower. It is OK to shave your face.  Please follow these instructions carefully.     Shower the NIGHT BEFORE SURGERY and the MORNING OF SURGERY with CHG Soap.   If you chose to wash your hair, wash your hair first as usual with your normal shampoo. After you shampoo, rinse your hair and body thoroughly to remove the shampoo.  Then  [employer] and genitals (private parts) with your normal soap and rinse thoroughly to remove soap.  After that Use CHG Soap as you would any other liquid soap. You can apply CHG directly to the skin and wash gently with a scrungie or a clean washcloth.   Apply the CHG Soap to your body ONLY FROM THE NECK DOWN.  Do not use on open wounds or open sores. Avoid contact with your eyes, ears, mouth and genitals (private parts). Wash Face and genitals (private parts)  with your normal soap.   Wash thoroughly, paying special attention to the area where your surgery will be performed.  Thoroughly rinse your body with warm water from the neck down.  DO NOT shower/wash with your normal soap after using and rinsing off the CHG Soap.  Pat yourself dry with a CLEAN TOWEL.  8. Apply the Benzoyl Peroxide only the night before surgery.  Do Not use it the morning of surgery.  Wear CLEAN PAJAMAS to bed the night before surgery  Place CLEAN SHEETS on your bed the night before your surgery  DO NOT SLEEP WITH PETS.   Day of Surgery: Take a shower with CHG soap. Do not wear jewelry or makeup Do not wear lotions, powders, perfumes, or deodorant. Do not shave 48 hours prior to surgery.   Do not bring valuables to the hospital.  [facility] is not responsible for any belongings or valuables. Do not wear nail polish, gel polish, artificial nails, or any other type of covering on natural nails (fingers and toes) If you have artificial nails or gel coating that need to be removed by a nail salon, please have this removed prior to surgery. Artificial nails or gel coating may interfere with anesthesia's ability to adequately monitor your vital signs.  Wear Clean/Comfortable clothing the morning of surgery Remember to brush your teeth WITH YOUR REGULAR TOOTHPASTE.    Please read over the following fact sheets that you were given.    If you received a COVID test during your pre-op visit  it is requested that  you wear a mask when out in public, stay away from anyone that may not be feeling well and notify your surgeon if you develop symptoms. If you have been  in contact with anyone that has tested positive in the last 10 days please notify you surgeon.

## 2022-08-19 NOTE — Progress Notes (Signed)
PCP - Dr. Lucianne Lei Cardiologist - denies  PPM/ICD - denies   Chest x-ray - 06/06/21 EKG - 08/12/21 Stress Test - denies ECHO - 12/17/16 Cardiac Cath - denies  Sleep Study - 2012 per pt, OSA+ CPAP - denies, pt says she cannot afford a CPAP machine   DM- denies  Blood Thinner Instructions: n/a Aspirin Instructions: f/u with surgeon for instructions  ERAS Protcol - yes PRE-SURGERY Ensure given at PAT  COVID TEST- n/a   Anesthesia review: no  Patient denies shortness of breath, fever, cough and chest pain at PAT appointment   All instructions explained to the patient, with a verbal understanding of the material. Patient agrees to go over the instructions while at home for a better understanding. The opportunity to ask questions was provided.

## 2022-08-20 LAB — URINE CULTURE: Culture: NO GROWTH

## 2022-08-24 ENCOUNTER — Telehealth: Payer: Self-pay | Admitting: *Deleted

## 2022-08-24 NOTE — Care Plan (Signed)
RNCM call to patient prior to her upcoming Right Reverse Total shoulder arthroplasty with Dr. Marlou Sa on 08/25/22. Patient is an Ortho bundle patient through [facility] and is agreeable to case management. Spoke with patient's daughter's only. They are working on discharge plan of who will be home with their mother after surgery. She has a home CPM machine already delivered by [employer] prior to surgery. Reviewed briefly post op instructions. Will review with patient after surgery and provide a copy. Will continue to follow for needs.

## 2022-08-24 NOTE — Telephone Encounter (Signed)
Ortho bundle pre-op call completed; Unable to speak with patient and get SANE question as only spoke with daughter, No answer on patient's cell number.

## 2022-08-24 NOTE — Anesthesia Preprocedure Evaluation (Signed)
Anesthesia Evaluation  Patient identified by MRN, date of birth, ID band Patient awake    Reviewed: Allergy & Precautions, NPO status , Patient's Chart, lab work & pertinent test results  History of Anesthesia Complications (+) history of anesthetic complications (issues w/ nerve block in past)  Airway Mallampati: II  TM Distance: >3 FB Neck ROM: Full    Dental  (+) Edentulous Lower, Edentulous Upper   Pulmonary sleep apnea (does not use CPAP) , Patient abstained from smoking., former smoker Quit smoking 2022   Pulmonary exam normal breath sounds clear to auscultation       Cardiovascular hypertension, Pt. on medications +CHF (grade 1 diastolic dysfunction)  + Valvular Problems/Murmurs (Mild TR)  Rhythm:Regular Rate:Normal + Systolic murmurs Echo 1314: - Left ventricle: The cavity size was normal. There was mildconcentric hypertrophy. Systolic function was normal. The estimated ejection fraction was in the range of 60% to 65%. Wallmotion was normal; there were no regional wall motionabnormalities. Doppler parameters are consistent with abnormalleft ventricular relaxation (grade 1 diastolic dysfunction).  - Aortic valve: Trileaflet; normal thickness, mildly calcified  leaflets. Valve area (VTI): 1.46 cm^2. Valve area (Vmax): 1.55cm^2. Valve area (Vmean): 1.45 cm^2.  - Mitral valve: Mildly calcified annulus. Mildly calcified leaflets.  - Left atrium: The atrium was moderately dilated.  - Right atrium: The atrium was mildly dilated.    Neuro/Psych CVA (L sided weakness, uses walker to ambulate), Residual Symptoms  negative psych ROS   GI/Hepatic Neg liver ROS,GERD  Controlled,,acute cholecystitis- has been having nausea, no vomiting   Endo/Other    Morbid obesityBMI 42  Renal/GU Renal disease  negative genitourinary   Musculoskeletal  (+) Arthritis , Osteoarthritis,    Abdominal  (+) + obese  Peds   Hematology  (+) Blood dyscrasia, anemia hct 35.2    Anesthesia Other Findings   Reproductive/Obstetrics negative OB ROS                             Anesthesia Physical  Anesthesia Plan  ASA: 3  Anesthesia Plan: General   Post-op Pain Management: Tylenol PO (pre-op)*, Celebrex PO (pre-op)* and Dilaudid IV   Induction: Intravenous  PONV Risk Score and Plan: 4 or greater and Ondansetron, Dexamethasone and Treatment may vary due to age or medical condition  Airway Management Planned: Oral ETT  Additional Equipment: None  Intra-op Plan:   Post-operative Plan: Extubation in OR  Informed Consent: I have reviewed the patients History and Physical, chart, labs and discussed the procedure including the risks, benefits and alternatives for the proposed anesthesia with the patient or authorized representative who has indicated his/her understanding and acceptance.     Dental advisory given  Plan Discussed with: CRNA  Anesthesia Plan Comments: (Patient refuses block)        Anesthesia Quick Evaluation

## 2022-08-25 ENCOUNTER — Ambulatory Visit (HOSPITAL_COMMUNITY): Payer: Medicare Other | Admitting: Anesthesiology

## 2022-08-25 ENCOUNTER — Encounter (HOSPITAL_COMMUNITY)
Admission: RE | Disposition: A | Discharge status: Home / Self Care | Payer: Self-pay | Source: Home / Self Care | Attending: Orthopedic Surgery

## 2022-08-25 ENCOUNTER — Other Ambulatory Visit: Payer: Self-pay

## 2022-08-25 ENCOUNTER — Observation Stay (HOSPITAL_COMMUNITY)
Admission: RE | Admit: 2022-08-25 | Discharge: 2022-08-26 | Disposition: A | Discharge status: Home / Self Care | Payer: Medicare Other | Attending: Orthopedic Surgery | Admitting: Orthopedic Surgery

## 2022-08-25 ENCOUNTER — Ambulatory Visit (HOSPITAL_BASED_OUTPATIENT_CLINIC_OR_DEPARTMENT_OTHER): Payer: Medicare Other | Admitting: Anesthesiology

## 2022-08-25 ENCOUNTER — Encounter (HOSPITAL_COMMUNITY): Payer: Self-pay | Admitting: Orthopedic Surgery

## 2022-08-25 ENCOUNTER — Observation Stay (HOSPITAL_COMMUNITY): Payer: Medicare Other

## 2022-08-25 DIAGNOSIS — Z7982 Long term (current) use of aspirin: Secondary | ICD-10-CM | POA: Insufficient documentation

## 2022-08-25 DIAGNOSIS — Z96653 Presence of artificial knee joint, bilateral: Secondary | ICD-10-CM | POA: Insufficient documentation

## 2022-08-25 DIAGNOSIS — M19011 Primary osteoarthritis, right shoulder: Secondary | ICD-10-CM | POA: Diagnosis not present

## 2022-08-25 DIAGNOSIS — Z8673 Personal history of transient ischemic attack (TIA), and cerebral infarction without residual deficits: Secondary | ICD-10-CM | POA: Insufficient documentation

## 2022-08-25 DIAGNOSIS — Z471 Aftercare following joint replacement surgery: Secondary | ICD-10-CM | POA: Diagnosis not present

## 2022-08-25 DIAGNOSIS — I10 Essential (primary) hypertension: Secondary | ICD-10-CM | POA: Diagnosis not present

## 2022-08-25 DIAGNOSIS — I509 Heart failure, unspecified: Secondary | ICD-10-CM | POA: Diagnosis not present

## 2022-08-25 DIAGNOSIS — Z87891 Personal history of nicotine dependence: Secondary | ICD-10-CM | POA: Insufficient documentation

## 2022-08-25 DIAGNOSIS — Z96611 Presence of right artificial shoulder joint: Secondary | ICD-10-CM

## 2022-08-25 DIAGNOSIS — Z96641 Presence of right artificial hip joint: Secondary | ICD-10-CM | POA: Insufficient documentation

## 2022-08-25 DIAGNOSIS — Z79899 Other long term (current) drug therapy: Secondary | ICD-10-CM | POA: Insufficient documentation

## 2022-08-25 DIAGNOSIS — Z01818 Encounter for other preprocedural examination: Secondary | ICD-10-CM

## 2022-08-25 DIAGNOSIS — I110 Hypertensive heart disease with heart failure: Secondary | ICD-10-CM | POA: Diagnosis not present

## 2022-08-25 DIAGNOSIS — M19019 Primary osteoarthritis, unspecified shoulder: Secondary | ICD-10-CM

## 2022-08-25 DIAGNOSIS — G4730 Sleep apnea, unspecified: Secondary | ICD-10-CM | POA: Diagnosis not present

## 2022-08-25 HISTORY — PX: REVERSE SHOULDER ARTHROPLASTY: SHX5054

## 2022-08-25 SURGERY — ARTHROPLASTY, SHOULDER, TOTAL, REVERSE
Anesthesia: General | Site: Shoulder | Laterality: Right

## 2022-08-25 MED ORDER — METOCLOPRAMIDE HCL 5 MG PO TABS: 5.0000 mg | ORAL_TABLET | Freq: Three times a day (TID) | ORAL | Status: DC | PRN

## 2022-08-25 MED ORDER — CEFAZOLIN SODIUM-DEXTROSE 2-4 GM/100ML-% IV SOLN
2.0000 g | INTRAVENOUS | Status: AC
  Administered 2022-08-25: 2 g via INTRAVENOUS
  Filled 2022-08-25: qty 100

## 2022-08-25 MED ORDER — METHOCARBAMOL 1000 MG/10ML IJ SOLN
500.0000 mg | Freq: Four times a day (QID) | INTRAVENOUS | Status: DC | PRN
  Administered 2022-08-25: 500 mg via INTRAVENOUS
  Filled 2022-08-25: qty 5

## 2022-08-25 MED ORDER — ONDANSETRON HCL 4 MG/2ML IJ SOLN: 4.0000 mg | Freq: Four times a day (QID) | INTRAMUSCULAR | Status: DC | PRN

## 2022-08-25 MED ORDER — LIDOCAINE 2% (20 MG/ML) 5 ML SYRINGE
INTRAMUSCULAR | Status: DC | PRN
  Administered 2022-08-25: 100 mg via INTRAVENOUS

## 2022-08-25 MED ORDER — 0.9 % SODIUM CHLORIDE (POUR BTL) OPTIME
TOPICAL | Status: DC | PRN
  Administered 2022-08-25 (×5): 1000 mL

## 2022-08-25 MED ORDER — IRBESARTAN 150 MG PO TABS
150.0000 mg | ORAL_TABLET | Freq: Every day | ORAL | Status: DC
  Administered 2022-08-26: 150 mg via ORAL
  Filled 2022-08-25: qty 1

## 2022-08-25 MED ORDER — MORPHINE SULFATE (PF) 4 MG/ML IV SOLN
INTRAVENOUS | Status: AC
  Filled 2022-08-25: qty 2

## 2022-08-25 MED ORDER — MENTHOL 3 MG MT LOZG: 1.0000 | LOZENGE | OROMUCOSAL | Status: DC | PRN

## 2022-08-25 MED ORDER — KETOROLAC TROMETHAMINE 30 MG/ML IJ SOLN
INTRAMUSCULAR | Status: AC
  Filled 2022-08-25: qty 1

## 2022-08-25 MED ORDER — MORPHINE SULFATE (PF) 4 MG/ML IV SOLN
INTRAVENOUS | Status: DC | PRN
  Administered 2022-08-25: 8 mg via INTRAVENOUS

## 2022-08-25 MED ORDER — PROPOFOL 10 MG/ML IV BOLUS
INTRAVENOUS | Status: DC | PRN
  Administered 2022-08-25: 20 mg via INTRAVENOUS
  Administered 2022-08-25 (×3): 30 mg via INTRAVENOUS
  Administered 2022-08-25: 90 mg via INTRAVENOUS

## 2022-08-25 MED ORDER — CLONIDINE HCL (ANALGESIA) 100 MCG/ML EP SOLN
EPIDURAL | Status: DC | PRN
  Administered 2022-08-25: 100 ug

## 2022-08-25 MED ORDER — OXYCODONE HCL 5 MG PO TABS
5.0000 mg | ORAL_TABLET | Freq: Once | ORAL | Status: AC
  Administered 2022-08-25: 5 mg via ORAL

## 2022-08-25 MED ORDER — PROPOFOL 10 MG/ML IV BOLUS
INTRAVENOUS | Status: AC
  Filled 2022-08-25: qty 20

## 2022-08-25 MED ORDER — IRRISEPT - 450ML BOTTLE WITH 0.05% CHG IN STERILE WATER, USP 99.95% OPTIME
TOPICAL | Status: DC | PRN
  Administered 2022-08-25: 450 mL

## 2022-08-25 MED ORDER — HYDRALAZINE HCL 20 MG/ML IJ SOLN
INTRAMUSCULAR | Status: AC
  Filled 2022-08-25: qty 1

## 2022-08-25 MED ORDER — IBUPROFEN 400 MG PO TABS: 400.0000 mg | ORAL_TABLET | Freq: Three times a day (TID) | ORAL | Status: DC | PRN

## 2022-08-25 MED ORDER — VANCOMYCIN HCL 1000 MG IV SOLR
INTRAVENOUS | Status: AC
  Filled 2022-08-25: qty 20

## 2022-08-25 MED ORDER — TRANEXAMIC ACID-NACL 1000-0.7 MG/100ML-% IV SOLN
INTRAVENOUS | Status: AC
  Filled 2022-08-25: qty 100

## 2022-08-25 MED ORDER — DEXAMETHASONE SODIUM PHOSPHATE 10 MG/ML IJ SOLN
INTRAMUSCULAR | Status: DC | PRN
  Administered 2022-08-25: 5 mg via INTRAVENOUS

## 2022-08-25 MED ORDER — HYDROMORPHONE HCL 1 MG/ML IJ SOLN
0.2500 mg | INTRAMUSCULAR | Status: DC | PRN
  Administered 2022-08-25 (×2): 0.5 mg via INTRAVENOUS

## 2022-08-25 MED ORDER — KETOROLAC TROMETHAMINE 30 MG/ML IJ SOLN
INTRAMUSCULAR | Status: DC | PRN
  Administered 2022-08-25: 15 mg via INTRAVENOUS

## 2022-08-25 MED ORDER — METHOCARBAMOL 500 MG PO TABS
500.0000 mg | ORAL_TABLET | Freq: Four times a day (QID) | ORAL | Status: DC | PRN
  Administered 2022-08-25 – 2022-08-26 (×2): 500 mg via ORAL
  Filled 2022-08-25 (×2): qty 1

## 2022-08-25 MED ORDER — AMISULPRIDE (ANTIEMETIC) 5 MG/2ML IV SOLN: 10.0000 mg | Freq: Once | INTRAVENOUS | Status: DC | PRN

## 2022-08-25 MED ORDER — ROCURONIUM BROMIDE 10 MG/ML (PF) SYRINGE
PREFILLED_SYRINGE | INTRAVENOUS | Status: DC | PRN
  Administered 2022-08-25: 50 mg via INTRAVENOUS

## 2022-08-25 MED ORDER — CEFAZOLIN SODIUM-DEXTROSE 2-4 GM/100ML-% IV SOLN
2.0000 g | Freq: Three times a day (TID) | INTRAVENOUS | Status: AC
  Administered 2022-08-25 – 2022-08-26 (×3): 2 g via INTRAVENOUS
  Filled 2022-08-25 (×3): qty 100

## 2022-08-25 MED ORDER — FENTANYL CITRATE (PF) 100 MCG/2ML IJ SOLN
25.0000 ug | INTRAMUSCULAR | Status: DC | PRN
  Administered 2022-08-25: 25 ug via INTRAVENOUS
  Administered 2022-08-25: 50 ug via INTRAVENOUS
  Administered 2022-08-25: 25 ug via INTRAVENOUS

## 2022-08-25 MED ORDER — HYDRALAZINE HCL 20 MG/ML IJ SOLN
INTRAMUSCULAR | Status: DC | PRN
  Administered 2022-08-25 (×2): 2.5 mg via INTRAVENOUS

## 2022-08-25 MED ORDER — ACETAMINOPHEN 500 MG PO TABS
1000.0000 mg | ORAL_TABLET | Freq: Once | ORAL | Status: AC
  Administered 2022-08-25: 1000 mg via ORAL
  Filled 2022-08-25: qty 2

## 2022-08-25 MED ORDER — POVIDONE-IODINE 10 % EX SWAB
2.0000 | Freq: Once | CUTANEOUS | Status: AC
  Administered 2022-08-25: 2 via TOPICAL

## 2022-08-25 MED ORDER — HYDROMORPHONE HCL 1 MG/ML IJ SOLN
INTRAMUSCULAR | Status: AC
  Filled 2022-08-25: qty 0.5

## 2022-08-25 MED ORDER — HYDROMORPHONE HCL 1 MG/ML IJ SOLN
0.5000 mg | INTRAMUSCULAR | Status: DC | PRN
  Administered 2022-08-26 (×3): 0.5 mg via INTRAVENOUS
  Filled 2022-08-25 (×4): qty 0.5

## 2022-08-25 MED ORDER — ONDANSETRON HCL 4 MG/2ML IJ SOLN
INTRAMUSCULAR | Status: AC
  Filled 2022-08-25: qty 2

## 2022-08-25 MED ORDER — METOCLOPRAMIDE HCL 5 MG/ML IJ SOLN: 5.0000 mg | Freq: Three times a day (TID) | INTRAMUSCULAR | Status: DC | PRN

## 2022-08-25 MED ORDER — PHENYLEPHRINE 80 MCG/ML (10ML) SYRINGE FOR IV PUSH (FOR BLOOD PRESSURE SUPPORT)
PREFILLED_SYRINGE | INTRAVENOUS | Status: DC | PRN
  Administered 2022-08-25: 80 ug via INTRAVENOUS
  Administered 2022-08-25: 40 ug via INTRAVENOUS
  Administered 2022-08-25 (×2): 80 ug via INTRAVENOUS

## 2022-08-25 MED ORDER — CHLORHEXIDINE GLUCONATE 0.12 % MT SOLN
15.0000 mL | Freq: Once | OROMUCOSAL | Status: AC
  Administered 2022-08-25: 15 mL via OROMUCOSAL
  Filled 2022-08-25: qty 15

## 2022-08-25 MED ORDER — FENTANYL CITRATE (PF) 100 MCG/2ML IJ SOLN
INTRAMUSCULAR | Status: AC
  Filled 2022-08-25: qty 2

## 2022-08-25 MED ORDER — FENTANYL CITRATE (PF) 250 MCG/5ML IJ SOLN
INTRAMUSCULAR | Status: DC | PRN
  Administered 2022-08-25 (×3): 50 ug via INTRAVENOUS
  Administered 2022-08-25: 25 ug via INTRAVENOUS
  Administered 2022-08-25: 50 ug via INTRAVENOUS
  Administered 2022-08-25: 25 ug via INTRAVENOUS

## 2022-08-25 MED ORDER — BUPIVACAINE HCL 0.25 % IJ SOLN
INTRAMUSCULAR | Status: DC | PRN
  Administered 2022-08-25: 30 mL

## 2022-08-25 MED ORDER — ONDANSETRON HCL 4 MG/2ML IJ SOLN
INTRAMUSCULAR | Status: DC | PRN
  Administered 2022-08-25: 4 mg via INTRAVENOUS

## 2022-08-25 MED ORDER — HYDROMORPHONE HCL 1 MG/ML IJ SOLN
0.2500 mg | INTRAMUSCULAR | Status: DC | PRN
  Administered 2022-08-25 (×4): 0.5 mg via INTRAVENOUS

## 2022-08-25 MED ORDER — PHENYLEPHRINE HCL-NACL 20-0.9 MG/250ML-% IV SOLN
INTRAVENOUS | Status: DC | PRN
  Administered 2022-08-25: 25 ug/min via INTRAVENOUS

## 2022-08-25 MED ORDER — ORAL CARE MOUTH RINSE: 15.0000 mL | Freq: Once | OROMUCOSAL | Status: AC

## 2022-08-25 MED ORDER — HYDROMORPHONE HCL 1 MG/ML IJ SOLN
INTRAMUSCULAR | Status: DC | PRN
  Administered 2022-08-25 (×2): .25 mg via INTRAVENOUS

## 2022-08-25 MED ORDER — ASPIRIN 81 MG PO TBEC
81.0000 mg | DELAYED_RELEASE_TABLET | Freq: Every day | ORAL | Status: DC
  Administered 2022-08-25: 81 mg via ORAL
  Filled 2022-08-25: qty 1

## 2022-08-25 MED ORDER — ONDANSETRON HCL 4 MG PO TABS: 4.0000 mg | ORAL_TABLET | Freq: Four times a day (QID) | ORAL | Status: DC | PRN

## 2022-08-25 MED ORDER — ROCURONIUM BROMIDE 10 MG/ML (PF) SYRINGE
PREFILLED_SYRINGE | INTRAVENOUS | Status: AC
  Filled 2022-08-25: qty 10

## 2022-08-25 MED ORDER — TRANEXAMIC ACID-NACL 1000-0.7 MG/100ML-% IV SOLN
INTRAVENOUS | Status: DC | PRN
  Administered 2022-08-25: 1000 mg via INTRAVENOUS

## 2022-08-25 MED ORDER — ACETAMINOPHEN 325 MG PO TABS: 325.0000 mg | ORAL_TABLET | Freq: Four times a day (QID) | ORAL | Status: DC | PRN

## 2022-08-25 MED ORDER — LIDOCAINE 2% (20 MG/ML) 5 ML SYRINGE
INTRAMUSCULAR | Status: AC
  Filled 2022-08-25: qty 5

## 2022-08-25 MED ORDER — LACTATED RINGERS IV SOLN: INTRAVENOUS | Status: DC

## 2022-08-25 MED ORDER — BUPIVACAINE HCL (PF) 0.25 % IJ SOLN
INTRAMUSCULAR | Status: AC
  Filled 2022-08-25: qty 30

## 2022-08-25 MED ORDER — ALBUMIN HUMAN 5 % IV SOLN: INTRAVENOUS | Status: DC | PRN

## 2022-08-25 MED ORDER — GABAPENTIN 100 MG PO CAPS
100.0000 mg | ORAL_CAPSULE | Freq: Three times a day (TID) | ORAL | Status: DC
  Administered 2022-08-25 – 2022-08-26 (×3): 100 mg via ORAL
  Filled 2022-08-25 (×3): qty 1

## 2022-08-25 MED ORDER — HYDROMORPHONE HCL 1 MG/ML IJ SOLN
INTRAMUSCULAR | Status: AC
  Filled 2022-08-25: qty 1

## 2022-08-25 MED ORDER — OXYCODONE HCL 5 MG PO TABS
ORAL_TABLET | ORAL | Status: AC
  Filled 2022-08-25: qty 1

## 2022-08-25 MED ORDER — CLONIDINE HCL (ANALGESIA) 100 MCG/ML EP SOLN
EPIDURAL | Status: AC
  Filled 2022-08-25: qty 10

## 2022-08-25 MED ORDER — SUGAMMADEX SODIUM 200 MG/2ML IV SOLN
INTRAVENOUS | Status: DC | PRN
  Administered 2022-08-25: 200 mg via INTRAVENOUS

## 2022-08-25 MED ORDER — DEXAMETHASONE SODIUM PHOSPHATE 10 MG/ML IJ SOLN
INTRAMUSCULAR | Status: AC
  Filled 2022-08-25: qty 1

## 2022-08-25 MED ORDER — DOCUSATE SODIUM 100 MG PO CAPS
100.0000 mg | ORAL_CAPSULE | Freq: Two times a day (BID) | ORAL | Status: DC
  Administered 2022-08-25 – 2022-08-26 (×2): 100 mg via ORAL
  Filled 2022-08-25 (×2): qty 1

## 2022-08-25 MED ORDER — HYDROMORPHONE HCL 1 MG/ML IJ SOLN
INTRAMUSCULAR | Status: AC
  Filled 2022-08-25: qty 2

## 2022-08-25 MED ORDER — POVIDONE-IODINE 7.5 % EX SOLN: Freq: Once | CUTANEOUS | Status: DC

## 2022-08-25 MED ORDER — MIDAZOLAM HCL 2 MG/2ML IJ SOLN
INTRAMUSCULAR | Status: DC | PRN
  Administered 2022-08-25: 1 mg via INTRAVENOUS

## 2022-08-25 MED ORDER — PHENOL 1.4 % MT LIQD: 1.0000 | OROMUCOSAL | Status: DC | PRN

## 2022-08-25 MED ORDER — OXYCODONE HCL 5 MG PO TABS
5.0000 mg | ORAL_TABLET | ORAL | Status: DC | PRN
  Administered 2022-08-25 – 2022-08-26 (×4): 10 mg via ORAL
  Filled 2022-08-25 (×4): qty 2

## 2022-08-25 MED ORDER — ACETAMINOPHEN 500 MG PO TABS
1000.0000 mg | ORAL_TABLET | Freq: Four times a day (QID) | ORAL | Status: AC
  Administered 2022-08-25 – 2022-08-26 (×4): 1000 mg via ORAL
  Filled 2022-08-25 (×4): qty 2

## 2022-08-25 MED ORDER — SODIUM CHLORIDE 0.9 % IV SOLN: INTRAVENOUS | Status: DC

## 2022-08-25 MED ORDER — INDAPAMIDE 1.25 MG PO TABS
1.2500 mg | ORAL_TABLET | Freq: Every morning | ORAL | Status: DC
  Administered 2022-08-26: 1.25 mg via ORAL
  Filled 2022-08-25: qty 1

## 2022-08-25 MED ORDER — FENTANYL CITRATE (PF) 250 MCG/5ML IJ SOLN
INTRAMUSCULAR | Status: AC
  Filled 2022-08-25: qty 5

## 2022-08-25 MED ORDER — MIDAZOLAM HCL 2 MG/2ML IJ SOLN
INTRAMUSCULAR | Status: AC
  Filled 2022-08-25: qty 2

## 2022-08-25 SURGICAL SUPPLY — 89 items
ALCOHOL 70% 16 OZ (MISCELLANEOUS) ×1 IMPLANT
BAG COUNTER SPONGE SURGICOUNT (BAG) ×1 IMPLANT
BASEPLATE AUG MED W-TAPER (Plate) IMPLANT
BEARING HUMERAL SHLDER 36M STD (Shoulder) IMPLANT
BIT DRILL 2.7 W/STOP DISP (BIT) IMPLANT
BIT DRILL QUICK REL 1/8 2PK SL (DRILL) IMPLANT
BIT DRILL TWIST 2.7 (BIT) IMPLANT
BLADE SAW SGTL 13X75X1.27 (BLADE) ×1 IMPLANT
CHLORAPREP W/TINT 26 (MISCELLANEOUS) ×1 IMPLANT
CLOSURE STERI STRIP 1/2 X4 (GAUZE/BANDAGES/DRESSINGS) IMPLANT
COOLER ICEMAN CLASSIC (MISCELLANEOUS) ×1 IMPLANT
COVER SURGICAL LIGHT HANDLE (MISCELLANEOUS) ×1 IMPLANT
DRAPE INCISE IOBAN 66X45 STRL (DRAPES) ×1 IMPLANT
DRAPE U-SHAPE 47X51 STRL (DRAPES) ×2 IMPLANT
DRESSING AQUACEL AG SP 3.5X10 (GAUZE/BANDAGES/DRESSINGS) IMPLANT
DRILL QUICK RELEASE 1/8 INCH (DRILL) ×1
DRSG AQUACEL AG ADV 3.5X10 (GAUZE/BANDAGES/DRESSINGS) ×1 IMPLANT
DRSG AQUACEL AG SP 3.5X10 (GAUZE/BANDAGES/DRESSINGS) ×1
ELECT BLADE 4.0 EZ CLEAN MEGAD (MISCELLANEOUS) ×1
ELECT REM PT RETURN 9FT ADLT (ELECTROSURGICAL) ×1
ELECTRODE BLDE 4.0 EZ CLN MEGD (MISCELLANEOUS) ×1 IMPLANT
ELECTRODE REM PT RTRN 9FT ADLT (ELECTROSURGICAL) ×1 IMPLANT
GAUZE SPONGE 4X4 12PLY STRL LF (GAUZE/BANDAGES/DRESSINGS) ×1 IMPLANT
GLENOID SPHERE STD STRL 36MM (Orthopedic Implant) IMPLANT
GLOVE BIOGEL PI IND STRL 7.0 (GLOVE) ×1 IMPLANT
GLOVE BIOGEL PI IND STRL 8 (GLOVE) ×1 IMPLANT
GLOVE ECLIPSE 6.5 STRL STRAW (GLOVE) IMPLANT
GLOVE ECLIPSE 7.0 STRL STRAW (GLOVE) ×1 IMPLANT
GLOVE ECLIPSE 8.0 STRL XLNG CF (GLOVE) ×1 IMPLANT
GOWN STRL REUS W/ TWL LRG LVL3 (GOWN DISPOSABLE) ×1 IMPLANT
GOWN STRL REUS W/ TWL XL LVL3 (GOWN DISPOSABLE) ×1 IMPLANT
GOWN STRL REUS W/TWL LRG LVL3 (GOWN DISPOSABLE) ×1
GOWN STRL REUS W/TWL XL LVL3 (GOWN DISPOSABLE) ×1
GUIDE MODEL REV SHLD RT (ORTHOPEDIC DISPOSABLE SUPPLIES) IMPLANT
HYDROGEN PEROXIDE 16OZ (MISCELLANEOUS) ×1 IMPLANT
JET LAVAGE IRRISEPT WOUND (IRRIGATION / IRRIGATOR)
KIT BASIN OR (CUSTOM PROCEDURE TRAY) ×1 IMPLANT
KIT TURNOVER KIT B (KITS) ×1 IMPLANT
LAVAGE JET IRRISEPT WOUND (IRRIGATION / IRRIGATOR) ×1 IMPLANT
LOOP VESSEL MAXI BLUE (MISCELLANEOUS) ×1 IMPLANT
MANIFOLD NEPTUNE II (INSTRUMENTS) ×1 IMPLANT
NDL SUT 6 .5 CRC .975X.05 MAYO (NEEDLE) IMPLANT
NDL TAPERED W/ NITINOL LOOP (MISCELLANEOUS) ×1 IMPLANT
NEEDLE HYPO 22GX1.5 SAFETY (NEEDLE) IMPLANT
NEEDLE MAYO TAPER (NEEDLE)
NEEDLE TAPERED W/ NITINOL LOOP (MISCELLANEOUS) ×1 IMPLANT
NS IRRIG 1000ML POUR BTL (IV SOLUTION) ×1 IMPLANT
PACK SHOULDER (CUSTOM PROCEDURE TRAY) ×1 IMPLANT
PAD ARMBOARD 7.5X6 YLW CONV (MISCELLANEOUS) ×2 IMPLANT
PAD COLD SHLDR WRAP-ON (PAD) ×1 IMPLANT
PASSER SUT SWANSON 36MM LOOP (INSTRUMENTS) ×1 IMPLANT
PIN THREADED REVERSE (PIN) IMPLANT
REAMER GUIDE BUSHING SURG DISP (MISCELLANEOUS) IMPLANT
REAMER GUIDE W/SCREW AUG (MISCELLANEOUS) IMPLANT
RESTRAINT HEAD UNIVERSAL NS (MISCELLANEOUS) ×1 IMPLANT
RETRIEVER SUT HEWSON (MISCELLANEOUS) IMPLANT
SCREW BONE LOCKING 4.75X30X3.5 (Screw) IMPLANT
SCREW BONE STRL 6.5MMX30MM (Screw) IMPLANT
SCREW LOCKING 4.75MMX15MM (Screw) IMPLANT
SCREW LOCKING NS 4.75MMX20MM (Screw) IMPLANT
SHOULDER HUMERAL BEAR 36M STD (Shoulder) ×1 IMPLANT
SLING ARM IMMOBILIZER LRG (SOFTGOODS) ×1 IMPLANT
SLING ARM IMMOBILIZER MED (SOFTGOODS) IMPLANT
SOL PREP POV-IOD 4OZ 10% (MISCELLANEOUS) ×1 IMPLANT
SPONGE T-LAP 18X18 ~~LOC~~+RFID (SPONGE) ×1 IMPLANT
STEM HUMERAL STRL 9MMX83MM (Stem) IMPLANT
STRIP CLOSURE SKIN 1/2X4 (GAUZE/BANDAGES/DRESSINGS) ×1 IMPLANT
SUCTION FRAZIER HANDLE 10FR (MISCELLANEOUS) ×1
SUCTION TUBE FRAZIER 10FR DISP (MISCELLANEOUS) ×1 IMPLANT
SUT BROADBAND TAPE 2PK 1.5 (SUTURE) IMPLANT
SUT FIBERWIRE #2 38 T-5 BLUE (SUTURE)
SUT MAXBRAID (SUTURE) IMPLANT
SUT MNCRL AB 3-0 PS2 18 (SUTURE) ×1 IMPLANT
SUT SILK 2 0 TIES 10X30 (SUTURE) ×1 IMPLANT
SUT VIC AB 0 CT1 27 (SUTURE) ×4
SUT VIC AB 0 CT1 27XBRD ANBCTR (SUTURE) ×4 IMPLANT
SUT VIC AB 1 CT1 27 (SUTURE) ×2
SUT VIC AB 1 CT1 27XBRD ANBCTR (SUTURE) ×2 IMPLANT
SUT VIC AB 2-0 CT1 27 (SUTURE) ×4
SUT VIC AB 2-0 CT1 TAPERPNT 27 (SUTURE) ×3 IMPLANT
SUT VICRYL 0 UR6 27IN ABS (SUTURE) ×2 IMPLANT
SUTURE FIBERWR #2 38 T-5 BLUE (SUTURE) IMPLANT
SYR 30ML LL (SYRINGE) IMPLANT
SYR TB 1ML LUER SLIP (SYRINGE) IMPLANT
TOWEL GREEN STERILE (TOWEL DISPOSABLE) ×1 IMPLANT
TRAY FOL W/BAG SLVR 16FR STRL (SET/KITS/TRAYS/PACK) IMPLANT
TRAY FOLEY W/BAG SLVR 16FR LF (SET/KITS/TRAYS/PACK)
TRAY HUM REV SHOULDER STD +6 (Shoulder) IMPLANT
WATER STERILE IRR 1000ML POUR (IV SOLUTION) ×1 IMPLANT

## 2022-08-25 NOTE — Anesthesia Postprocedure Evaluation (Signed)
Anesthesia Post Note  Patient: Ellen Henry  Procedure(s) Performed: RIGHT REVERSE SHOULDER ARTHROPLASTY (Right: Shoulder)     Patient location during evaluation: PACU Anesthesia Type: General Level of consciousness: sedated and patient cooperative Pain management: pain level controlled Vital Signs Assessment: post-procedure vital signs reviewed and stable Respiratory status: spontaneous breathing Cardiovascular status: stable Anesthetic complications: no   No notable events documented.  Last Vitals:  Vitals:   08/25/22 1330 08/25/22 1345  BP: 93/75 108/77  Pulse: 67 73  Resp: 13 14  Temp:    SpO2: 97% 97%    Last Pain:  Vitals:   08/25/22 1345  TempSrc:   PainSc: 9    Pain difficult to control due to patient refusal of peripheral nerve block. She is very sedated and sleeping, and will wake up moaning.               Nolon Nations

## 2022-08-25 NOTE — Brief Op Note (Signed)
   08/25/2022  1:01 PM  PATIENT:  Pamelia Hoit  76 y.o. female  PRE-OPERATIVE DIAGNOSIS:  right shoulder osteoarthritis  POST-OPERATIVE DIAGNOSIS:  right shoulder osteoarthritis  PROCEDURE:  Procedure(s): RIGHT REVERSE SHOULDER ARTHROPLASTY  SURGEON:  Surgeon(s): Marlou Sa, Tonna Corner, MD  ASSISTANT: Annie Main  ANESTHESIA:   General  EBL: 125 ml    Total I/O In: 9150 [I.V.:600; IV Piggyback:450] Out: 925 [Urine:800; Blood:125]  BLOOD ADMINISTERED: none  DRAINS: None  LOCAL MEDICATIONS USED: Marcaine morphine clonidine  SPECIMEN:  No Specimen  COUNTS:  YES  TOURNIQUET:  * No tourniquets in log *  DICTATION: .Other Dictation: Dictation Number [id number]  PLAN OF CARE: Admit for overnight observation  PATIENT DISPOSITION:  PACU - hemodynamically stable

## 2022-08-25 NOTE — Transfer of Care (Signed)
Immediate Anesthesia Transfer of Care Note  Patient: Ellen Henry  Procedure(s) Performed: RIGHT REVERSE SHOULDER ARTHROPLASTY (Right: Shoulder)  Patient Location: PACU  Anesthesia Type:General  Level of Consciousness: drowsy, patient cooperative and responds to stimulation  Airway & Oxygen Therapy: Patient Spontanous Breathing and Patient connected to face mask oxygen  Post-op Assessment: Report given to RN, Post -op Vital signs reviewed and stable and Patient moving all extremities X 4  Post vital signs: Reviewed and stable  Last Vitals:  Vitals Value Taken Time  BP 112/71 08/25/22 1131  Temp    Pulse 80 08/25/22 1135  Resp 19 08/25/22 1135  SpO2 95 % 08/25/22 1135  Vitals shown include unvalidated device data.  Last Pain:  Vitals:   08/25/22 0626  TempSrc:   PainSc: 8       Patients Stated Pain Goal: 1 (64/35/39 1225)  Complications: No notable events documented.

## 2022-08-25 NOTE — Anesthesia Procedure Notes (Signed)
Procedure Name: Intubation Date/Time: 08/25/2022 7:54 AM  Performed by: Michele Rockers, CRNAPre-anesthesia Checklist: Patient identified, Patient being monitored, Timeout performed, Emergency Drugs available and Suction available Patient Re-evaluated:Patient Re-evaluated prior to induction Oxygen Delivery Method: Circle System Utilized Preoxygenation: Pre-oxygenation with 100% oxygen Induction Type: IV induction Ventilation: Mask ventilation without difficulty Laryngoscope Size: Miller and 2 Grade View: Grade I Tube type: Oral Tube size: 7.0 mm Number of attempts: 1 Airway Equipment and Method: Stylet Placement Confirmation: ETT inserted through vocal cords under direct vision, positive ETCO2 and breath sounds checked- equal and bilateral Secured at: 20 cm Tube secured with: Tape Dental Injury: Teeth and Oropharynx as per pre-operative assessment

## 2022-08-25 NOTE — H&P (Addendum)
Ellen Henry is an 76 y.o. female.   Chief Complaint: right shoulder pain HPI:  Ellen Henry is a 76 y.o. female who presents to the office complaining of right shoulder pain.  Patient returns following right shoulder glenohumeral injection.  She states that the injection helped for about 6 weeks but now her pain has returned to the point it was prior to the injection.  She is taking gabapentin, ibuprofen, occasional hydrocodone for pain control.  Pain keeps her up at night and she cannot really lay on her right shoulder.  She does state that she has pain that travels from her shoulder to her hand but all of this pain got better with the injection.  She is fed up with her current symptoms and she would like to pursue shoulder replacement.  She did have CT scan of the right shoulder that was done on 02/11/2022.  No history of diabetes.  She does smoke cigarettes on occasion but states this is only 2 to 3 cigarettes/week.  Does have a history of stroke in 2018 but does not take any blood thinners.  She does not have a cardiologist or neurologist.  She follows with her PCP who is Dr. Criss Rosales.  She has history of hip replacement and bilateral knee replacements by Dr. Berenice Primas.  .   Past Medical History:  Diagnosis Date   Acute kidney failure due to procedure    Arthritis    Complication of anesthesia    after nerve block difficult breathing (at [facility] [place] transported to [facility])   Dry skin    GERD (gastroesophageal reflux disease)    "years ago", diet controlled   Gout    H/O pyelonephritis    as a child   Heart murmur    since birth, denies any problems. Echo 12/17/16   History of sepsis    after spinal surgery   Hypertension    Impingement syndrome of right shoulder    Neuropathy    Osteoarthritis of AC (acromioclavicular) joint    Right   Pneumonia    Radiculopathy    Rotator cuff tear, right    Sleep apnea    does not use cpap   Stroke ([place]) 12/15/2016   left side  weakness    Past Surgical History:  Procedure Laterality Date   ABDOMINAL EXPOSURE N/A 08/29/2014   Procedure: ABDOMINAL EXPOSURE;  Surgeon: Rosetta Posner, MD;  Location: [facility];  Service: Vascular;  Laterality: N/A;   ABDOMINAL HYSTERECTOMY     ANTERIOR LUMBAR FUSION N/A 08/29/2014   Procedure: ANTERIOR LUMBAR FUSION 1 LEVEL;  Surgeon: Sinclair Ship, MD;  Location: [place];  Service: Orthopedics;  Laterality: N/A;  Lumbar 4-5 anterior lumbar interbody fusion with allograft and instrumentation.   APPENDECTOMY     BACK SURGERY  08/28/2014 and 08/29/2014   lumbar fusion   CARPAL TUNNEL RELEASE Right    release done twice   CHOLECYSTECTOMY N/A 08/12/2021   Procedure: LAPAROSCOPIC CHOLECYSTECTOMY;  Surgeon: Jesusita Oka, MD;  Location: [place];  Service: General;  Laterality: N/A;   COLONOSCOPY     lower intestine removal     REPLACEMENT TOTAL KNEE  2010   rigth knee   SHOULDER ARTHROSCOPY Right 01/18/2018   Procedure: RIGHT SHOULDER ARTHROSCOPY, SUBACROMIAL DECOMPRESSION, DISTAL CLAVICLE RESECTION WITH MINI OPEN ROTATOR CUFF REPAIR;  Surgeon: Garald Balding, MD;  Location: [place];  Service: Orthopedics;  Laterality: Right;   TOE SURGERY Right    bone  spur R. Great toe   TONSILLECTOMY     TOTAL HIP ARTHROPLASTY Right 06/11/2021   Procedure: TOTAL HIP ARTHROPLASTY ANTERIOR APPROACH;  Surgeon: [name], MD;  Location: [facility];  Service: Orthopedics;  Laterality: Right;   TOTAL KNEE ARTHROPLASTY Left 02/15/2015   Procedure: TOTAL KNEE ARTHROPLASTY;  Surgeon: [name], MD;  Location: [place];  Service: Orthopedics;  Laterality: Left;    Family History  Problem Relation Age of Onset   Varicose Veins Mother    Social History:  reports that she quit smoking about 7 weeks ago. Her smoking use included cigarettes. She smoked an average of .5 packs per day. She has never used smokeless tobacco. She reports that she does not drink alcohol and does not use drugs.  Allergies:   Allergies  Allergen Reactions   Food Allergy Formula Other (See Comments)    Shellfish-banana-beef-flares up her gout    Medications Prior to Admission  Medication Sig Dispense Refill   acetaminophen (TYLENOL) 500 MG tablet Take 500 mg by mouth every 6 (six) hours as needed for mild pain or headache.     aspirin EC 81 MG tablet Take 81 mg by mouth daily.     diclofenac Sodium (VOLTAREN) 1 % GEL Apply 1 application topically 4 (four) times daily as needed (pain).     gabapentin (NEURONTIN) 100 MG capsule Take 1 capsule (100 mg total) by mouth 3 (three) times daily. 90 capsule 0   ibuprofen (ADVIL) 800 MG tablet Take 1 tablet (800 mg total) by mouth every 8 (eight) hours as needed for mild pain or moderate pain (alternate with tylenol and oxycodone). (Patient taking differently: Take 800-1,600 mg by mouth every 8 (eight) hours as needed (severe pain).)     indapamide (LOZOL) 1.25 MG tablet Take 1.25 mg by mouth every morning.     nicotine (NICODERM CQ - DOSED IN MG/24 HOURS) 21 mg/24hr patch Place 21 mg onto the skin daily.     rosuvastatin (CRESTOR) 20 MG tablet Take 20 mg by mouth at bedtime.     Sennosides (EX-LAX PO) Take 2 tablets by mouth daily as needed (constipation).     telmisartan (MICARDIS) 40 MG tablet Take 40 mg by mouth every morning.     traMADol (ULTRAM) 50 MG tablet TAKE 1 TABLET (50 MG TOTAL) BY MOUTH AT BEDTIME AS NEEDED FOR SEVERE PAIN. 23 tablet 0    No results found for this or any previous visit (from the past 48 hour(s)). No results found.  Review of Systems  Musculoskeletal:  Positive for arthralgias.  All other systems reviewed and are negative.   Blood pressure (!) 144/84, pulse 70, temperature 98.1 F (36.7 C), temperature source Oral, resp. rate 17, height 5' (1.524 m), weight 95.7 kg, SpO2 100 %. Physical Exam Vitals reviewed.  HENT:     Head: Normocephalic.     Nose: Nose normal.     Mouth/Throat:     Mouth: Mucous membranes are moist.  Eyes:      Pupils: Pupils are equal, round, and reactive to light.  Cardiovascular:     Rate and Rhythm: Normal rate.     Pulses: Normal pulses.  Pulmonary:     Effort: Pulmonary effort is normal.  Abdominal:     General: Abdomen is flat.  Musculoskeletal:     Cervical back: Normal range of motion.  Skin:    General: Skin is warm.     Capillary Refill: Capillary refill takes less than 2 seconds.  Neurological:     General: No focal deficit present.     Mental Status: She is alert.     Ortho exam demonstrates right shoulder with round 35 degrees external rotation, 80 degrees abduction, 150 degrees forward flexion.  Coarse grinding with passive range of motion of the shoulder consistent with arthritis.  Decent rotator cuff strength of supra, infra, subscap with no discernible weakness.  Axillary nerve is intact with deltoid firing.  Assessment/Plan  Patient is a 76 year old female who presents for evaluation of right shoulder pain.  She has history of right shoulder osteoarthritis.  CT scan of the right shoulder was done on 02/11/2022.  She had previous injection that only lasted for about 6 weeks and she would like definitive management of her arthritic pain.  She does have pain that travels from the shoulder down into her hand but states that all of this pain got better with the glenohumeral injection and she has had recent MRI of the cervical spine in March 2023 that demonstrated no significant neural impingement.  Discussed the risks and benefits of right shoulder replacement with Lelan Pons today which include but are not limited to the risk of nerve/blood vessel damage, shoulder stiffness, shoulder instability, need for revision surgery, medical complication from surgery such as stroke/MI/death/amputation, prosthetic joint infection.  After lengthy discussion of the recovery timeframe and the risks, patient would still like to proceed with surgery.  She will be posted for right shoulder reverse shoulder  arthroplasty and follow-up with the office after procedure  BMI: Estimated body mass index is 41.21 kg/m as calculated from the following:   Height as of this encounter: 5' (1.524 m).   Weight as of this encounter: 95.7 kg.  Lab Results  Component Value Date   ALBUMIN 2.8 (L) 08/15/2021   Diabetes: Patient does not have a diagnosis of diabetes.     Smoking Status: Social History   Tobacco Use  Smoking Status Former   Packs/day: 0.50   Types: Cigarettes   Quit date: 07/07/2022   Years since quitting: 0.1  Smokeless Tobacco Never   The patient is not currently a tobacco user. Counseling given: Not Answered     Anderson Malta, MD 08/25/2022, 6:54 AM

## 2022-08-25 NOTE — Plan of Care (Signed)

## 2022-08-25 NOTE — Op Note (Unsigned)
NAMECARLING, LIBERMAN MEDICAL RECORD NO: [id number] ACCOUNT NO: [id number] DATE OF BIRTH: 1946/08/06 FACILITY: [facility] LOCATION: [facility] PHYSICIAN: Yetta Barre. Marlou Sa, MD  Operative Report   DATE OF PROCEDURE: 08/25/2022  PREOPERATIVE DIAGNOSIS:  Right shoulder arthritis.  POSTOPERATIVE DIAGNOSIS:  Right shoulder arthritis.  PROCEDURE:  Right reverse shoulder replacement.  SURGEON ATTENDING:  Yetta Barre. Marlou Sa, MD  ASSISTANT:  Annie Main.  IMPLANTS UTILIZED:  Biomet components including size 9 mini stem with 40 mm +6 offset humeral tray and standard 36 mm bearing with 36 standard glenosphere and a medium augment glenoid baseplate with 1 central compression screw, 4 peripheral locking  screws.  INDICATIONS:  This is a 76 year old patient with end-stage right shoulder arthritis, who presents for right reverse shoulder replacement after explanation of risks and benefits.  DESCRIPTION OF PROCEDURE:  The patient was brought to the operating room where general endotracheal anesthesia was induced.  Preoperative antibiotics administered.  Timeout was called.  The patient was placed in the beach chair position with head in  neutral position.  Right shoulder, arm and hand prescrubbed with hydrogen peroxide followed by alcohol and Betadine, which was allowed to air dry, then prepped with DuraPrep solution and draped in sterile manner.  Charlie Pitter was used to cover the operative  field.  Timeout was called.  Deltopectoral approach was made.  Skin and subcutaneous tissue were sharply divided.  IrriSept solution utilized, at this time as well as at multiple times throughout the case.  Cephalic vein mobilized medially.   Deltopectoral interval was entered.  The deltoid was manually elevated from its anterior attachment distally.  Next, the subdeltoid and subacromial space was developed manually as well as with a Cobb elevator. The patient did have a superior rotator cuff  tear.  Subscap appeared intact.   Prior, the patient had auto tenodesed our biceps tendon.  At this time, Kolbel retractor was placed.  Axillary nerve was palpated and protected at all times during the remaining portion of the case.  Circumflex vessels  were ligated using silk ligatures.  Subscap was then detached and tagged with 0 Vicryl sutures.  Dissection was performed around the inferior aspect of the humeral head using a Cobb elevator around to the 5 o'clock position.  Head was dislocated.  B[place]ne  retractor placed along with reverse retractor.  Rongeur on the superior aspect of the humeral head was performed.  Reaming was performed up to a size 9. Humeral head was cut in 30 degrees of retroversion, which equaled the patient's native version.  A  cut was later revised about 2 more millimeters freehand.  Clide Dales was placed, which was size 9 and a cap was placed.  Peck tendon was released about 1.5 cm in order to facilitate mobilization of the humerus.  Next, posterior retractor was placed, anterior  retractor was placed with care being taken to avoid injury to the axillary nerve.  The labrum was circumferentially excised.  Bankart lesion created from the 12 o'clock to 6 o'clock position. The patient-specific guide was placed and the guide pin was  placed.  The reaming was then performed in accordance with preoperative templating to an inferior depth of 7.5 mm in about 6.5 to 7 degrees of inferior tilt.  Next, superior reaming was performed for the augment.  Good bleeding bone was encountered.   Next, the guide pins were removed.  The baseplate was placed in good position and alignment with 1 central compression screw and 4 peripheral locking screws placed.  At this  time, a standard glenosphere with 1.5 mm inferior offset was placed and the  humerus was reduced with a +6 offset humeral tray with standard bearing.  Reduction and subsequent dislocation was difficult.  The patient had excellent stability with this construct to extension as  well as adduction as well as forward flexion and  internal and external rotation.  The shoulder was reduced.  The trial components were removed.  The 6 suture tapes were placed through the lesser tuberosity through 3 holes.  Next, IrriSept solution, placed into the canal of the humerus.  The glenosphere  was then placed onto the dried baseplate.  Excellent fit was obtained.  The stem was then placed after placing Vancomycin powder into the canal after removing the IrriSept solution.  Good fit was obtained there as well.  Same stability parameters were  maintained with the +6 offset humeral tray and standard bearing.  Excellent stability obtained after reduction.  Thorough irrigation was then performed with 3 liters of irrigating solution. The subscap was repaired using the SutureTapes x6 with excellent  repair obtained and the arm able to externally rotate about 55 degrees.  Axillary nerve again palpated and found to be intact.  Next, after irrigating vancomycin powder was placed on the prosthesis.  Deltopectoral interval was then closed using #1  Vicryl suture followed by interrupted inverted 0 Vicryl suture, 2-0 Vicryl suture, and 3-0 Monocryl with Steri-Strips and Aquacel dressing applied.  The patient tolerated the procedure well without immediate complications.  Luke's assistance was required  at all times during the case for retraction, opening, closing, mobilization of tissue, drilling, reduction and dislocation.  His assistance was a medical necessity at all times during the case.   PUS D: 08/25/2022 1:09:30 pm T: 08/25/2022 1:59:00 pm  JOB: [id number]/ [id number]

## 2022-08-26 ENCOUNTER — Encounter (HOSPITAL_COMMUNITY): Payer: Self-pay | Admitting: Orthopedic Surgery

## 2022-08-26 DIAGNOSIS — Z96653 Presence of artificial knee joint, bilateral: Secondary | ICD-10-CM | POA: Diagnosis not present

## 2022-08-26 DIAGNOSIS — Z8673 Personal history of transient ischemic attack (TIA), and cerebral infarction without residual deficits: Secondary | ICD-10-CM | POA: Diagnosis not present

## 2022-08-26 DIAGNOSIS — I10 Essential (primary) hypertension: Secondary | ICD-10-CM | POA: Diagnosis not present

## 2022-08-26 DIAGNOSIS — Z79899 Other long term (current) drug therapy: Secondary | ICD-10-CM | POA: Diagnosis not present

## 2022-08-26 DIAGNOSIS — Z87891 Personal history of nicotine dependence: Secondary | ICD-10-CM | POA: Diagnosis not present

## 2022-08-26 DIAGNOSIS — M19011 Primary osteoarthritis, right shoulder: Secondary | ICD-10-CM | POA: Diagnosis not present

## 2022-08-26 DIAGNOSIS — Z96641 Presence of right artificial hip joint: Secondary | ICD-10-CM | POA: Diagnosis not present

## 2022-08-26 DIAGNOSIS — Z7982 Long term (current) use of aspirin: Secondary | ICD-10-CM | POA: Diagnosis not present

## 2022-08-26 LAB — CBC
HCT: 27.5 % — ABNORMAL LOW (ref 36.0–46.0)
Hemoglobin: 9.3 g/dL — ABNORMAL LOW (ref 12.0–15.0)
MCH: 30.9 pg (ref 26.0–34.0)
MCHC: 33.8 g/dL (ref 30.0–36.0)
MCV: 91.4 fL (ref 80.0–100.0)
Platelets: 172 10*3/uL (ref 150–400)
RBC: 3.01 MIL/uL — ABNORMAL LOW (ref 3.87–5.11)
RDW: 13.2 % (ref 11.5–15.5)
WBC: 10.3 10*3/uL (ref 4.0–10.5)
nRBC: 0 % (ref 0.0–0.2)

## 2022-08-26 MED ORDER — DOCUSATE SODIUM 100 MG PO CAPS: 100.0000 mg | ORAL_CAPSULE | Freq: Two times a day (BID) | ORAL | 0 refills | Status: DC

## 2022-08-26 MED ORDER — IBUPROFEN 400 MG PO TABS: 400.0000 mg | ORAL_TABLET | Freq: Three times a day (TID) | ORAL | 0 refills | Status: AC | PRN

## 2022-08-26 MED ORDER — OXYCODONE HCL 5 MG PO TABS: 5.0000 mg | ORAL_TABLET | ORAL | 0 refills | Status: DC | PRN

## 2022-08-26 MED ORDER — ASPIRIN 81 MG PO TBEC: 81.0000 mg | DELAYED_RELEASE_TABLET | Freq: Every day | ORAL | 12 refills | Status: AC

## 2022-08-26 MED ORDER — METHOCARBAMOL 500 MG PO TABS: 500.0000 mg | ORAL_TABLET | Freq: Four times a day (QID) | ORAL | 0 refills | Status: DC | PRN

## 2022-08-26 NOTE — Evaluation (Signed)
Occupational Therapy Evaluation Patient Details Name: Ellen Henry MRN: [id number] DOB: 1946/09/01 Today's Date: 08/26/2022   History of Present Illness Pt is a 76 year old woman admitted for R reverse TSA on 08/25/22. Pt with hx of a stroke with L side weakness. Other PMH includes CHF, sleep apnea, obesity, heart murmur, R rotator cuff sx, B knee replacements, R hip replacement.   Clinical Impression   Pt was functioning modified independently prior to admission and using a cane in her R hand. Pt presents with post op pain and generalized weakness. Pt educated in ordered precautions, exercises and compensatory strategies for ADLs. She verbalized and/or demonstrated understanding. Reinforced with written handouts.      Recommendations for follow up therapy are one component of a multi-disciplinary discharge planning process, led by the attending physician.  Recommendations may be updated based on patient status, additional functional criteria and insurance authorization.   Follow Up Recommendations  Follow physician's recommendations for discharge plan and follow up therapies    Assistance Recommended at Discharge Frequent or constant Supervision/Assistance  Patient can return home with the following A little help with walking and/or transfers;A lot of help with bathing/dressing/bathroom;Assistance with cooking/housework;Assist for transportation;Help with stairs or ramp for entrance    Functional Status Assessment  Patient has had a recent decline in their functional status and demonstrates the ability to make significant improvements in function in a reasonable and predictable amount of time.  Equipment Recommendations  None recommended by OT    Recommendations for Other Services       Precautions / Restrictions Precautions Precautions: Shoulder;Fall Type of Shoulder Precautions: pendulums, AROM elbow to hand Shoulder Interventions: Shoulder sling/immobilizer;Off for  dressing/bathing/exercises Precaution Booklet Issued: Yes (comment) Required Braces or Orthoses: Sling Restrictions Weight Bearing Restrictions: Yes RUE Weight Bearing: Non weight bearing      Mobility Bed Mobility               General bed mobility comments: pt in chair, plans to sleep on her couch, instructed pt to get up to her L side to avoid pushing up in her R UE    Transfers Overall transfer level: Needs assistance Equipment used: Straight cane Transfers: Sit to/from Stand Sit to Stand: Min guard                  Balance                                           ADL either performed or assessed with clinical judgement   ADL Overall ADL's : Needs assistance/impaired Eating/Feeding: Independent;Sitting   Grooming: Supervision/safety;Sitting   Upper Body Bathing: Moderate assistance;Sitting   Lower Body Bathing: Moderate assistance;Sit to/from stand   Upper Body Dressing : Moderate assistance;Sitting   Lower Body Dressing: Moderate assistance;Sit to/from stand   Toilet Transfer: Min guard;Ambulation   Toileting- Clothing Manipulation and Hygiene: Min guard;Sit to/from stand       Functional mobility during ADLs: Min guard;Cane General ADL Comments: Educated pt in compensatory strategies for ADLs, positioning R UE in bed and chair, use of sling and wearing schedule.     Vision Ability to See in Adequate Light: 0 Adequate Patient Visual Report: No change from baseline       Perception     Praxis      Pertinent Vitals/Pain Pain Assessment Pain Assessment: 0-10 Pain Score: 7  Pain Location: R shoulder     Hand Dominance Right   Extremity/Trunk Assessment Upper Extremity Assessment Upper Extremity Assessment: LUE deficits/detail;RUE deficits/detail RUE Deficits / Details: full AROM elbow to hand RUE: Unable to fully assess due to immobilization RUE Coordination: decreased gross motor LUE Deficits / Details:  weakness from previous stroke   Lower Extremity Assessment Lower Extremity Assessment: Defer to PT evaluation       Communication Communication Communication: No difficulties   Cognition Arousal/Alertness: Awake/alert Behavior During Therapy: WFL for tasks assessed/performed Overall Cognitive Status: Within Functional Limits for tasks assessed                                       General Comments       Exercises Exercises: Other exercises Other Exercises Other Exercises: pendulum exercises, R shoulder Other Exercises: AROM R elbow to hand x 10   Shoulder Instructions      Home Living Family/patient expects to be discharged to:: Private residence Living Arrangements: Alone Available Help at Discharge: Family;Available 24 hours/day Type of Home: Apartment Home Access: Stairs to enter Entrance Stairs-Number of Steps: 13+7 Entrance Stairs-Rails: Right;Left Home Layout: One level     Bathroom Shower/Tub: Teacher, early years/pre: Handicapped height     Home Equipment: Cane - single point;Rollator (4 wheels);Rolling Walker (2 wheels)          Prior Functioning/Environment Prior Level of Function : Independent/Modified Independent             Mobility Comments: walks with a cane in R hand          OT Problem List:        OT Treatment/Interventions:      OT Goals(Current goals can be found in the care plan section)    OT Frequency:      Co-evaluation              AM-PAC OT "6 Clicks" Daily Activity     Outcome Measure Help from another person eating meals?: None Help from another person taking care of personal grooming?: A Little Help from another person toileting, which includes using toliet, bedpan, or urinal?: A Lot Help from another person bathing (including washing, rinsing, drying)?: A Lot Help from another person to put on and taking off regular upper body clothing?: A Lot Help from another person to put on  and taking off regular lower body clothing?: A Lot 6 Click Score: 15   End of Session    Activity Tolerance: Patient tolerated treatment well Patient left: in chair;with call bell/phone within reach  OT Visit Diagnosis: Muscle weakness (generalized) (M62.81);Pain                Time: 5170-0174 OT Time Calculation (min): 39 min Charges:  OT General Charges $OT Visit: 1 Visit OT Evaluation $OT Eval Moderate Complexity: 1 Mod OT Treatments $Self Care/Home Management : 8-22 mins $Therapeutic Exercise: 8-22 mins  Cleta Alberts, OTR/L Acute Rehabilitation Services Office: [phone]   Malka So 08/26/2022, 9:33 AM

## 2022-08-26 NOTE — Evaluation (Signed)
Physical Therapy Evaluation Patient Details Name: Ellen Henry MRN: [id number] DOB: [birth year] Today's Date: 08/26/2022  History of Present Illness  Pt is a 76 year old woman admitted for R reverse TSA on 08/25/22. Pt with hx of a stroke with L side weakness. Other PMH includes CHF, sleep apnea, obesity, heart murmur, R rotator cuff sx, B knee replacements, R hip replacement.  Clinical Impression  Pt admitted with above diagnosis. At baseline, pt is independent and ambulates with cane in R hand due to prior CVA on L side.  Today, pt able to ambulate in hallway using cane in L hand and min guard.  She did fatigue easier than baseline.  Mildly unsteady but no overt LOB and pt has support at home that can provide supervision.  Demonstrates mobility necessary to return home from PT perspective but will benefit from PT while admitted to advance to baseline. Pt currently with functional limitations due to the deficits listed below (see PT Problem List). Pt will benefit from skilled PT to increase their independence and safety with mobility to allow discharge to the venue listed below.          Recommendations for follow up therapy are one component of a multi-disciplinary discharge planning process, led by the attending physician.  Recommendations may be updated based on patient status, additional functional criteria and insurance authorization.  Follow Up Recommendations No PT follow up (will likely get OT for shoulder when needed)      Assistance Recommended at Discharge Intermittent Supervision/Assistance  Patient can return home with the following  A little help with walking and/or transfers;A little help with bathing/dressing/bathroom;Assistance with cooking/housework;Help with stairs or ramp for entrance    Equipment Recommendations None recommended by PT  Recommendations for Other Services       Functional Status Assessment Patient has had a recent decline in their functional status and  demonstrates the ability to make significant improvements in function in a reasonable and predictable amount of time.     Precautions / Restrictions Precautions Precautions: Shoulder;Fall Type of Shoulder Precautions: pendulums, AROM elbow to hand Shoulder Interventions: Shoulder sling/immobilizer;Off for dressing/bathing/exercises Precaution Booklet Issued: Yes (comment) Required Braces or Orthoses: Sling Restrictions RUE Weight Bearing: Non weight bearing      Mobility  Bed Mobility               General bed mobility comments: pt in chair, plans to sleep on her couch, instructed pt to get up to her L side to avoid pushing up in her R UE    Transfers Overall transfer level: Needs assistance Equipment used: Straight cane Transfers: Sit to/from Stand Sit to Stand: Supervision                Ambulation/Gait Ambulation/Gait assistance: Min guard, Supervision Gait Distance (Feet): 100 Feet Assistive device: Straight cane Gait Pattern/deviations: Step-to pattern, Decreased stride length Gait velocity: decreased     General Gait Details: Min guard progressing to supervision; mild increase in lateral sway but no LOB; pt typically uses cane in R hand but able to do in L due to R surgery; did fatigue easily  Stairs Stairs:  (reports has rails and will have assistance; did not want to try)          Wheelchair Mobility    Modified Rankin (Stroke Patients Only)       Balance Overall balance assessment: Needs assistance   Sitting balance-Leahy Scale: Good     Standing balance support: Single extremity supported,  No upper extremity supported Standing balance-Leahy Scale: Good Standing balance comment: Could stand and take a couple steps without AD but otherwise using cane                             Pertinent Vitals/Pain Pain Assessment Pain Assessment: 0-10 Pain Score: 8  Pain Location: R shoulder Pain Descriptors / Indicators:  Aching Pain Intervention(s): Limited activity within patient's tolerance, Monitored during session    Home Living Family/patient expects to be discharged to:: Private residence Living Arrangements: Alone Available Help at Discharge: Family;Available 24 hours/day Type of Home: Apartment Home Access: Stairs to enter Entrance Stairs-Rails: Right;Left Entrance Stairs-Number of Steps: 13+7   Home Layout: One level Home Equipment: Cane - single point;Rollator (4 wheels);Rolling Walker (2 wheels)      Prior Function Prior Level of Function : Independent/Modified Independent             Mobility Comments: walks with a cane in R hand could ambulate in community       Hand Dominance   Dominant Hand: Right    Extremity/Trunk Assessment   Upper Extremity Assessment Upper Extremity Assessment: Defer to OT evaluation    Lower Extremity Assessment Lower Extremity Assessment: Overall WFL for tasks assessed (ROM WFL; MMT 5/5)    Cervical / Trunk Assessment Cervical / Trunk Assessment: Normal  Communication   Communication: No difficulties  Cognition Arousal/Alertness: Awake/alert Behavior During Therapy: WFL for tasks assessed/performed Overall Cognitive Status: Within Functional Limits for tasks assessed                                          General Comments General comments (skin integrity, edema, etc.): VSS, did have some shortness of breath with walking but she felt like due to pain    Exercises     Assessment/Plan    PT Assessment Patient needs continued PT services  PT Problem List Decreased strength;Decreased mobility;Decreased range of motion;Decreased activity tolerance;Decreased balance;Decreased knowledge of use of DME;Pain;Decreased knowledge of precautions       PT Treatment Interventions DME instruction;Therapeutic activities;Modalities;Gait training;Therapeutic exercise;Patient/family education;Stair training;Balance  training;Functional mobility training    PT Goals (Current goals can be found in the Care Plan section)  Acute Rehab PT Goals Patient Stated Goal: return home PT Goal Formulation: With patient Time For Goal Achievement: 09/08/22 Potential to Achieve Goals: Good    Frequency Min 3X/week     Co-evaluation               AM-PAC PT "6 Clicks" Mobility  Outcome Measure Help needed turning from your back to your side while in a flat bed without using bedrails?: A Little Help needed moving from lying on your back to sitting on the side of a flat bed without using bedrails?: A Little Help needed moving to and from a bed to a chair (including a wheelchair)?: A Little Help needed standing up from a chair using your arms (e.g., wheelchair or bedside chair)?: A Little Help needed to walk in hospital room?: A Little Help needed climbing 3-5 steps with a railing? : A Little 6 Click Score: 18    End of Session Equipment Utilized During Treatment: Gait belt Activity Tolerance: Patient tolerated treatment well Patient left: in chair;with call bell/phone within reach Nurse Communication: Mobility status PT Visit Diagnosis: Other abnormalities of gait and  mobility (R26.89);Unsteadiness on feet (R26.81)    Time: 2297-9892 PT Time Calculation (min) (ACUTE ONLY): 13 min   Charges:   PT Evaluation $PT Eval Low Complexity: 1 Low          Nedim Oki, PT Acute Rehab [facility] [phone]   Karlton Lemon 08/26/2022, 2:18 PM

## 2022-08-26 NOTE — Plan of Care (Signed)
  Problem: Education: Goal: Knowledge of the prescribed therapeutic regimen will improve Outcome: Progressing   Problem: Activity: Goal: Ability to tolerate increased activity will improve Outcome: Progressing   Problem: Pain Management: Goal: Pain level will decrease with appropriate interventions Outcome: Progressing   Problem: Education: Goal: Knowledge of General Education information will improve Description: Including pain rating scale, medication(s)/side effects and non-pharmacologic comfort measures Outcome: Progressing

## 2022-08-26 NOTE — TOC Transition Note (Signed)
Transition of Care [facility]) - CM/SW Discharge Note   Patient Details  Name: Ellen Henry MRN: [id number] Date of Birth: 02-20-1946  Transition of Care [facility]) CM/SW Contact:  Sharin Mons, RN Phone Number: 08/26/2022, 11:05 AM   Clinical Narrative:    Patient will DC to: home Anticipated DC date: 08/26/2022 Family notified:yes Transport by: car    - s/p Right reverse shoulder replacement, 08/25/2022 Per MD patient ready for DC today with therapy clearance. RN, patient, and  patient's family aware of DC plan. Pt from home alone however daughter will assist with care once d/c. Post hospital f/u noted on AVS. Pt without DME needs. States has CPM @ home. Pt without Rx med concerns.  Daughter to provide transportation to home.  RNCM will sign off for now as intervention is no longer needed. Please consult [place] again if new needs arise.    Final next level of care: Home/Self Care Barriers to Discharge: No Barriers Identified   Patient Goals and CMS Choice        Discharge Placement                       Discharge Plan and Services                                     Social Determinants of Health (SDOH) Interventions     Readmission Risk Interventions     No data to display

## 2022-08-26 NOTE — Progress Notes (Signed)
  Subjective: Patient stable.  Pain moderately well controlled.  Dressing dry.   Objective: Vital signs in last 24 hours: Temp:  [97.5 F (36.4 C)-98.4 F (36.9 C)] 97.6 F (36.4 C) (09/20 0854) Pulse Rate:  [67-85] 75 (09/20 0854) Resp:  [10-19] 19 (09/20 0800) BP: (93-127)/(58-99) 106/58 (09/20 0854) SpO2:  [94 %-99 %] 97 % (09/20 0854)  Intake/Output from previous day: 09/19 0701 - 09/20 0700 In: 1842.2 [I.V.:1337.2; IV Piggyback:505] Out: 925 [Urine:800; Blood:125] Intake/Output this shift: No intake/output data recorded.  Exam:  Sensation intact distally Intact pulses distally  Labs: Recent Labs    08/26/22 0400  HGB 9.3*   Recent Labs    08/26/22 0400  WBC 10.3  RBC 3.01*  HCT 27.5*  PLT 172   No results for input(s): "NA", "K", "CL", "CO2", "BUN", "CREATININE", "GLUCOSE", "CALCIUM" in the last 72 hours. No results for input(s): "LABPT", "INR" in the last 72 hours.  Assessment/Plan: Plan at this time is to discharge to home.  CPM machine 1 hour 3 times a day.  Pain medicine and muscle relaxers prescribed.  Follow-up with [place] in 2 weeks.  Okay to shower.   Ellen Henry 08/26/2022, 9:47 AM

## 2022-08-26 NOTE — Progress Notes (Signed)
Pt has DC order this AM, but was delayed due to pt needs to be seen by PT first before DC. As RN was about the DC the pt, pt reported that the pain level has not been any better and increase in the abdominal gasses. PA Lurena Joiner was called in and said he is off today, Dr's office was called in too. Pt refused to stay longer until we hear from the MD. AVS was given and explained pt. Daughter to pick up pt.

## 2022-08-28 ENCOUNTER — Telehealth: Payer: Self-pay | Admitting: *Deleted

## 2022-08-28 NOTE — Telephone Encounter (Signed)
D/C call completed.

## 2022-08-30 DIAGNOSIS — M19019 Primary osteoarthritis, unspecified shoulder: Secondary | ICD-10-CM

## 2022-09-02 ENCOUNTER — Telehealth: Payer: Self-pay | Admitting: *Deleted

## 2022-09-02 NOTE — Telephone Encounter (Signed)
Ortho bundle 7 day call attempted. No answer and left VM.

## 2022-09-08 ENCOUNTER — Telehealth: Payer: Self-pay | Admitting: *Deleted

## 2022-09-08 NOTE — Telephone Encounter (Signed)
09/04/22- Attempted 2nd 7 day call to patient. No answer and left VM requesting call back on daughter's phone.

## 2022-09-09 ENCOUNTER — Encounter: Payer: Self-pay | Admitting: Orthopedic Surgery

## 2022-09-09 ENCOUNTER — Ambulatory Visit (INDEPENDENT_AMBULATORY_CARE_PROVIDER_SITE_OTHER): Payer: Medicare Other | Admitting: Orthopedic Surgery

## 2022-09-09 ENCOUNTER — Ambulatory Visit (INDEPENDENT_AMBULATORY_CARE_PROVIDER_SITE_OTHER): Payer: Medicare Other

## 2022-09-09 DIAGNOSIS — Z96611 Presence of right artificial shoulder joint: Secondary | ICD-10-CM

## 2022-09-09 MED ORDER — METHOCARBAMOL 500 MG PO TABS: 500.0000 mg | ORAL_TABLET | Freq: Four times a day (QID) | ORAL | 0 refills | Status: DC | PRN

## 2022-09-09 MED ORDER — OXYCODONE HCL 5 MG PO TABS: 5.0000 mg | ORAL_TABLET | Freq: Four times a day (QID) | ORAL | 0 refills | Status: DC | PRN

## 2022-09-09 NOTE — Discharge Summary (Signed)
Physician Discharge Summary      Patient ID: Ellen Henry MRN: [id number] DOB/AGE: 07-10-1946 76 y.o.  Admit date: 08/25/2022 Discharge date: 08/26/2022  Admission Diagnoses:  Principal Problem:   S/P reverse total shoulder arthroplasty, right Active Problems:   Shoulder arthritis   Discharge Diagnoses:  Same  Surgeries: Procedure(s): RIGHT REVERSE SHOULDER ARTHROPLASTY on 08/25/2022   Consultants:   Discharged Condition: Stable  Hospital Course: RHODESIA STANGER is an 76 y.o. female who was admitted 08/25/2022 with a chief complaint of right shoulder pain, and found to have a diagnosis of right shoulder arthritis.  They were brought to the operating room on 08/25/2022 and underwent the above named procedures.  Pt awoke from anesthesia without complication and was transferred to the floor. On POD1, patient's pain was controlled.  She was able to ambulate with physical therapy and had no red flag symptoms.  She was discharged home on POD 1.  Pt will f/u with Dr. Marlou Sa in clinic in ~2 weeks.   Antibiotics given:  Anti-infectives (From admission, onward)    Start     Dose/Rate Route Frequency Ordered Stop   08/25/22 1600  ceFAZolin (ANCEF) IVPB 2g/100 mL premix        2 g 200 mL/hr over 30 Minutes Intravenous Every 8 hours 08/25/22 1533 08/26/22 0833   08/25/22 0600  ceFAZolin (ANCEF) IVPB 2g/100 mL premix        2 g 200 mL/hr over 30 Minutes Intravenous On call to O.R. 08/25/22 0553 08/25/22 0800     .  Recent vital signs:  Vitals:   08/26/22 0854 08/26/22 1024  BP: (!) 106/58 (!) 140/81  Pulse: 75   Resp:    Temp: 97.6 F (36.4 C)   SpO2: 97%     Recent laboratory studies:  Results for orders placed or performed during the hospital encounter of 08/25/22  CBC  Result Value Ref Range   WBC 10.3 4.0 - 10.5 K/uL   RBC 3.01 (L) 3.87 - 5.11 MIL/uL   Hemoglobin 9.3 (L) 12.0 - 15.0 g/dL   HCT 27.5 (L) 36.0 - 46.0 %   MCV 91.4 80.0 - 100.0 fL   MCH 30.9 26.0 - 34.0  pg   MCHC 33.8 30.0 - 36.0 g/dL   RDW 13.2 11.5 - 15.5 %   Platelets 172 150 - 400 K/uL   nRBC 0.0 0.0 - 0.2 %    Discharge Medications:   Allergies as of 08/26/2022       Reactions   Food Allergy Formula Other (See Comments)   Shellfish-banana-beef-flares up her gout        Medication List     STOP taking these medications    diclofenac Sodium 1 % Gel Commonly known as: VOLTAREN   traMADol 50 MG tablet Commonly known as: ULTRAM       TAKE these medications    acetaminophen 500 MG tablet Commonly known as: TYLENOL Take 500 mg by mouth every 6 (six) hours as needed for mild pain or headache.   aspirin EC 81 MG tablet Take 1 tablet (81 mg total) by mouth at bedtime. Swallow whole. What changed:  when to take this additional instructions   docusate sodium 100 MG capsule Commonly known as: COLACE Take 1 capsule (100 mg total) by mouth 2 (two) times daily.   EX-LAX PO Take 2 tablets by mouth daily as needed (constipation).   gabapentin 100 MG capsule Commonly known as: NEURONTIN Take 1 capsule (100 mg total)  by mouth 3 (three) times daily.   ibuprofen 400 MG tablet Commonly known as: ADVIL Take 1 tablet (400 mg total) by mouth every 8 (eight) hours as needed for mild pain or moderate pain (alternate with tylenol and oxycodone). What changed:  medication strength how much to take   indapamide 1.25 MG tablet Commonly known as: LOZOL Take 1.25 mg by mouth every morning.   nicotine 21 mg/24hr patch Commonly known as: NICODERM CQ - dosed in mg/24 hours Place 21 mg onto the skin daily.   rosuvastatin 20 MG tablet Commonly known as: CRESTOR Take 20 mg by mouth at bedtime.   telmisartan 40 MG tablet Commonly known as: MICARDIS Take 40 mg by mouth every morning.        Diagnostic Studies: XR Shoulder Right  Result Date: 09/09/2022 AP axillary outlet radiographs right shoulder reviewed.  Reverse l shoulder prosthesis in good position alignment with  no complicating features.  DG Shoulder Right Port  Result Date: 08/25/2022 CLINICAL DATA:  Postoperative status post reverse total right shoulder arthroplasty. EXAM: RIGHT SHOULDER - 1 VIEW COMPARISON:  Right shoulder radiographs 02/05/2022 FINDINGS: Status post reverse total right shoulder arthroplasty. No perihardware lucency is seen on single limited frontal view to indicate hardware failure or loosening. Expected postoperative changes including subacromial/subdeltoid bursa air. Likely prior partial distal clavicle excision. Moderate peripheral distal clavicular degenerative osteophytosis. Moderate multilevel degenerative disc space narrowing and endplate osteophytes of the thoracic spine. No acute fracture or dislocation. IMPRESSION: Status post reverse total right shoulder arthroplasty without evidence of hardware failure. Electronically Signed   By: Yvonne Kendall M.D.   On: 08/25/2022 11:59    Disposition: Discharge disposition: 01-Home or Self Care       Discharge Instructions     Call MD / Call 911   Complete by: As directed    If you experience chest pain or shortness of breath, CALL 911 and be transported to the hospital emergency room.  If you develope a fever above 101 F, pus (white drainage) or increased drainage or redness at the wound, or calf pain, call your surgeon's office.   Constipation Prevention   Complete by: As directed    Drink plenty of fluids.  Prune juice may be helpful.  You may use a stool softener, such as Colace (over the counter) 100 mg twice a day.  Use MiraLax (over the counter) for constipation as needed.   Diet - low sodium heart healthy   Complete by: As directed    Discharge instructions   Complete by: As directed    Patient okay to spend some time out of the sling.  Okay to shower.  Dressing is waterproof  No lifting with right arm Use CPM machine 1 hour 3 times a day increasing the degrees daily   Increase activity slowly as tolerated   Complete  by: As directed    Post-operative opioid taper instructions:   Complete by: As directed    POST-OPERATIVE OPIOID TAPER INSTRUCTIONS: It is important to wean off of your opioid medication as soon as possible. If you do not need pain medication after your surgery it is ok to stop day one. Opioids include: Codeine, Hydrocodone(Norco, Vicodin), Oxycodone(Percocet, oxycontin) and hydromorphone amongst others.  Long term and even short term use of opiods can cause: Increased pain response Dependence Constipation Depression Respiratory depression And more.  Withdrawal symptoms can include Flu like symptoms Nausea, vomiting And more Techniques to manage these symptoms Hydrate well Eat regular healthy meals  Stay active Use relaxation techniques(deep breathing, meditating, yoga) Do Not substitute Alcohol to help with tapering If you have been on opioids for less than two weeks and do not have pain than it is ok to stop all together.  Plan to wean off of opioids This plan should start within one week post op of your joint replacement. Maintain the same interval or time between taking each dose and first decrease the dose.  Cut the total daily intake of opioids by one tablet each day Next start to increase the time between doses. The last dose that should be eliminated is the evening dose.           Follow-up Information     Lucianne Lei, MD Follow up.   Specialty: Family Medicine Contact information: [address] [place] [address] [phone]         [facility]. Schedule an appointment as soon as possible for a visit on 09/09/2022.   Specialty: Orthopedics Why: Follow appointment scheduled for 09/09/2022 at 10:30 am with provider Contact information: [place] [address] [phone]                 Signed: Annie Main 09/09/2022, 7:49 PM

## 2022-09-09 NOTE — Progress Notes (Signed)
Post-Op Visit Note   Patient: Ellen Henry           Date of Birth: 09/25/1946           MRN: [id number] Visit Date: 09/09/2022 PCP: Lucianne Lei, MD   Assessment & Plan:  Chief Complaint:  Chief Complaint  Patient presents with   Right Shoulder - Routine Post Op    08/25/22 R RSA   Visit Diagnoses:  1. History of arthroplasty of right shoulder     Plan: Patient presents for follow-up 2 weeks out right reverse shoulder replacement.  On exam incision intact.  She is doing well.  She wants to hold off on any physical therapy.  Deltoid is functional.  Radiographs x-rays.  Plan is refill oxycodone and muscle relaxer.  Follow-up in 4 weeks for clinical recheck  Follow-Up Instructions: No follow-ups on file.   Orders:  Orders Placed This Encounter  Procedures   XR Shoulder Right   Meds ordered this encounter  Medications   oxyCODONE (OXY IR/ROXICODONE) 5 MG immediate release tablet    Sig: Take 1-2 tablets (5-10 mg total) by mouth every 6 (six) hours as needed for moderate pain (pain score 4-6).    Dispense:  30 tablet    Refill:  0   methocarbamol (ROBAXIN) 500 MG tablet    Sig: Take 1 tablet (500 mg total) by mouth every 6 (six) hours as needed for muscle spasms.    Dispense:  30 tablet    Refill:  0    Imaging: No results found.  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   History of laparoscopic cholecystectomy 08/15/2021   History of cholecystitis 08/15/2021   Status post total hip replacement, right 06/13/2021   Primary osteoarthritis of right hip 06/11/2021   Status post total replacement of right hip 06/11/2021   Complete tear of right rotator cuff 01/18/2018   Impingement syndrome of right shoulder 01/18/2018   AC (acromioclavicular) arthritis 01/18/2018   Rotator cuff tear 01/18/2018   Low oxygen saturation 12/09/2017   Chronic constipation     Failed back syndrome    Chronic pain syndrome    Radicular pain    Chronic bilateral low back pain without sciatica    Dysphagia, post-stroke    Left hemiparesis ([place])    Acute ischemic stroke ([place]) 12/15/2016   Urinary retention 02/25/2015   Primary osteoarthritis of left knee 02/15/2015   Morbid obesity ([place]) 02/15/2015   Essential hypertension, benign 09/07/2014   Hyperlipidemia 09/05/2014   Gout 09/05/2014   Neuropathy 09/05/2014   Radiculopathy 08/29/2014   Past Medical History:  Diagnosis Date   Acute kidney failure due to procedure    Arthritis    Complication of anesthesia    after nerve block difficult breathing (at [facility] [address] transported to [facility])   Dry skin    GERD (gastroesophageal reflux disease)    "years ago", diet controlled   Gout    H/O pyelonephritis    as a child   Heart murmur    since birth, denies any problems. Echo 12/17/16   History of sepsis    after spinal surgery   Hypertension    Impingement syndrome of right shoulder    Neuropathy    Osteoarthritis of AC (acromioclavicular) joint    Right   Pneumonia    Radiculopathy    Rotator cuff tear,  right    Sleep apnea    does not use cpap   Stroke ([place]) 12/15/2016   left side weakness    Family History  Problem Relation Age of Onset   Varicose Veins Mother     Past Surgical History:  Procedure Laterality Date   ABDOMINAL EXPOSURE N/A 08/29/2014   Procedure: ABDOMINAL EXPOSURE;  Surgeon: Rosetta Posner, MD;  Location: [place];  Service: Vascular;  Laterality: N/A;   ABDOMINAL HYSTERECTOMY     ANTERIOR LUMBAR FUSION N/A 08/29/2014   Procedure: ANTERIOR LUMBAR FUSION 1 LEVEL;  Surgeon: Sinclair Ship, MD;  Location: [place];  Service: Orthopedics;  Laterality: N/A;  Lumbar 4-5 anterior lumbar interbody fusion with allograft and instrumentation.   APPENDECTOMY     BACK SURGERY  08/28/2014 and 08/29/2014   lumbar fusion   CARPAL TUNNEL RELEASE Right    release done twice    CHOLECYSTECTOMY N/A 08/12/2021   Procedure: LAPAROSCOPIC CHOLECYSTECTOMY;  Surgeon: Jesusita Oka, MD;  Location: [place];  Service: General;  Laterality: N/A;   COLONOSCOPY     lower intestine removal     REPLACEMENT TOTAL KNEE  2010   rigth knee   REVERSE SHOULDER ARTHROPLASTY Right 08/25/2022   Procedure: RIGHT REVERSE SHOULDER ARTHROPLASTY;  Surgeon: Meredith Pel, MD;  Location: [place];  Service: Orthopedics;  Laterality: Right;   SHOULDER ARTHROSCOPY Right 01/18/2018   Procedure: RIGHT SHOULDER ARTHROSCOPY, SUBACROMIAL DECOMPRESSION, DISTAL CLAVICLE RESECTION WITH MINI OPEN ROTATOR CUFF REPAIR;  Surgeon: Garald Balding, MD;  Location: [place];  Service: Orthopedics;  Laterality: Right;   TOE SURGERY Right    bone spur R. Great toe   TONSILLECTOMY     TOTAL HIP ARTHROPLASTY Right 06/11/2021   Procedure: TOTAL HIP ARTHROPLASTY ANTERIOR APPROACH;  Surgeon: [name], MD;  [facility];  Service: Orthopedics;  Laterality: Right;   TOTAL KNEE ARTHROPLASTY Left 02/15/2015   Procedure: TOTAL KNEE ARTHROPLASTY;  Surgeon: [name], MD;  Location: [place];  Service: Orthopedics;  Laterality: Left;   Social History   Occupational History   Not on file  Tobacco Use   Smoking status: Former    Packs/day: 0.50    Types: Cigarettes    Quit date: 07/07/2022    Years since quitting: 0.1   Smokeless tobacco: Never  Vaping Use   Vaping Use: Never used  Substance and Sexual Activity   Alcohol use: No   Drug use: No    Comment: sometimes will take a vicodin from friend   Sexual activity: Never

## 2022-09-10 ENCOUNTER — Telehealth: Payer: Self-pay | Admitting: *Deleted

## 2022-09-10 NOTE — Telephone Encounter (Signed)
14 day Ortho bundle F/U visit in office completed on 09/09/22.

## 2022-09-25 ENCOUNTER — Telehealth: Payer: Self-pay | Admitting: *Deleted

## 2022-09-25 NOTE — Telephone Encounter (Signed)
Attempted 30 day call; no answer and left VM.

## 2022-10-09 ENCOUNTER — Ambulatory Visit (INDEPENDENT_AMBULATORY_CARE_PROVIDER_SITE_OTHER): Payer: Medicare Other | Admitting: Surgical

## 2022-10-09 ENCOUNTER — Encounter: Payer: Self-pay | Admitting: Orthopedic Surgery

## 2022-10-09 ENCOUNTER — Telehealth: Payer: Self-pay | Admitting: *Deleted

## 2022-10-09 DIAGNOSIS — Z96611 Presence of right artificial shoulder joint: Secondary | ICD-10-CM

## 2022-10-09 NOTE — Telephone Encounter (Signed)
Ortho bundle 30 day call completed. °

## 2022-10-11 ENCOUNTER — Encounter: Payer: Self-pay | Admitting: Orthopedic Surgery

## 2022-10-11 MED ORDER — OXYCODONE HCL 5 MG PO TABS: 5.0000 mg | ORAL_TABLET | Freq: Two times a day (BID) | ORAL | 0 refills | Status: DC | PRN

## 2022-10-11 NOTE — Progress Notes (Signed)
Post-Op Visit Note   Patient: Ellen Henry           Date of Birth: 1946/02/10           MRN: [id number] Visit Date: 10/09/2022 PCP: Lucianne Lei, MD   Assessment & Plan:  Chief Complaint:  Chief Complaint  Patient presents with   Right Shoulder - Routine Post Op   Visit Diagnoses:  1. History of arthroplasty of right shoulder     Plan: Patient is a 76 year old female who presents for reevaluation following right reverse shoulder arthroplasty on 08/25/2022.  She is progressing well and feels like she is making constant improvement but she is still having a decent amount of pain.  This is waking her up at night most nights but she still feels this is improving as before she was unable to get pretty much any sleep but now she gets about 4 hours of sleep at a time before she wakes up from pain.  She still has to take oxycodone for pain control.  She still notes some decreased range of motion but is working on this with home exercise program.  No history of instability symptoms since procedure.  No fevers or chills.  No chest pain or trouble breathing.  On exam, incision is healing well.  She has 50 degrees external rotation, 75 degrees abduction, 130 degrees forward flexion.  Axillary nerve is intact with deltoid firing.  She has mild tenderness overlying the acromion but no ecchymosis or swelling noted around the acromion.  Plan is to refill oxycodone but plan to have this to be her last or second the last prescription before fully tapering off.  She will continue with home exercise program and avoiding heavy lifting and follow-up in 6 weeks for final check with Dr. Marlou Sa.  Need to recheck tenderness over the acromion to ensure she is not developing acromial stress fracture which seems unlikely at this point but would be good to double check.  Overall her symptoms are improving and not worsening.  She also complains of painful cyst on the upper mid back.  On exam, it appears she has an  epidermal inclusion cyst.  It is quite large and she would like to have definitive management of this cyst.  She states that sometimes she will squeeze out a foul-smelling substance.  There is no erythema or fluctuance to the cyst and there is no suggestion of infection.  Plan for further evaluation by general surgery for consideration of cyst excision.    Recommend holding off until 3 months out from reverse shoulder arthroplasty for any excision if needed.  Follow-Up Instructions: No follow-ups on file.   Orders:  No orders of the defined types were placed in this encounter.  Meds ordered this encounter  Medications   oxyCODONE (OXY IR/ROXICODONE) 5 MG immediate release tablet    Sig: Take 1 tablet (5 mg total) by mouth every 12 (twelve) hours as needed for moderate pain (pain score 4-6).    Dispense:  30 tablet    Refill:  0    Imaging: No results found.  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   History of laparoscopic cholecystectomy 08/15/2021   History of cholecystitis 08/15/2021   Status post total hip replacement, right 06/13/2021   Primary osteoarthritis of right hip 06/11/2021   Status post total replacement of right hip 06/11/2021  Complete tear of right rotator cuff 01/18/2018   Impingement syndrome of right shoulder 01/18/2018   AC (acromioclavicular) arthritis 01/18/2018   Rotator cuff tear 01/18/2018   Low oxygen saturation 12/09/2017   Chronic constipation    Failed back syndrome    Chronic pain syndrome    Radicular pain    Chronic bilateral low back pain without sciatica    Dysphagia, post-stroke    Left hemiparesis ([place])    Acute ischemic stroke ([place]) 12/15/2016   Urinary retention 02/25/2015   Primary osteoarthritis of left knee 02/15/2015   Morbid obesity ([place]) 02/15/2015   Essential hypertension, benign 09/07/2014   Hyperlipidemia 09/05/2014    Gout 09/05/2014   Neuropathy 09/05/2014   Radiculopathy 08/29/2014   Past Medical History:  Diagnosis Date   Acute kidney failure due to procedure    Arthritis    Complication of anesthesia    after nerve block difficult breathing (at [place] [address] transported to [facility])   Dry skin    GERD (gastroesophageal reflux disease)    "years ago", diet controlled   Gout    H/O pyelonephritis    as a child   Heart murmur    since birth, denies any problems. Echo 12/17/16   History of sepsis    after spinal surgery   Hypertension    Impingement syndrome of right shoulder    Neuropathy    Osteoarthritis of AC (acromioclavicular) joint    Right   Pneumonia    Radiculopathy    Rotator cuff tear, right    Sleep apnea    does not use cpap   Stroke ([place]) 12/15/2016   left side weakness    Family History  Problem Relation Age of Onset   Varicose Veins Mother     Past Surgical History:  Procedure Laterality Date   ABDOMINAL EXPOSURE N/A 08/29/2014   Procedure: ABDOMINAL EXPOSURE;  Surgeon: Rosetta Posner, MD;  Location: [place];  Service: Vascular;  Laterality: N/A;   ABDOMINAL HYSTERECTOMY     ANTERIOR LUMBAR FUSION N/A 08/29/2014   Procedure: ANTERIOR LUMBAR FUSION 1 LEVEL;  Surgeon: Sinclair Ship, MD;  Location: [place];  Service: Orthopedics;  Laterality: N/A;  Lumbar 4-5 anterior lumbar interbody fusion with allograft and instrumentation.   APPENDECTOMY     BACK SURGERY  08/28/2014 and 08/29/2014   lumbar fusion   CARPAL TUNNEL RELEASE Right    release done twice   CHOLECYSTECTOMY N/A 08/12/2021   Procedure: LAPAROSCOPIC CHOLECYSTECTOMY;  Surgeon: Jesusita Oka, MD;  Location: [place];  Service: General;  Laterality: N/A;   COLONOSCOPY     lower intestine removal     REPLACEMENT TOTAL KNEE  2010   rigth knee   REVERSE SHOULDER ARTHROPLASTY Right 08/25/2022   Procedure: RIGHT REVERSE SHOULDER ARTHROPLASTY;  Surgeon: Meredith Pel, MD;  Location: [place];   Service: Orthopedics;  Laterality: Right;   SHOULDER ARTHROSCOPY Right 01/18/2018   Procedure: RIGHT SHOULDER ARTHROSCOPY, SUBACROMIAL DECOMPRESSION, DISTAL CLAVICLE RESECTION WITH MINI OPEN ROTATOR CUFF REPAIR;  Surgeon: Garald Balding, MD;  Location: [place];  Service: Orthopedics;  Laterality: Right;   TOE SURGERY Right    bone spur R. Great toe   TONSILLECTOMY     TOTAL HIP ARTHROPLASTY Right 06/11/2021   Procedure: TOTAL HIP ARTHROPLASTY ANTERIOR APPROACH;  Surgeon: [name], MD;  [facility];  Service: Orthopedics;  Laterality: Right;   TOTAL KNEE ARTHROPLASTY Left 02/15/2015   Procedure: TOTAL KNEE ARTHROPLASTY;  Surgeon:  [name], MD;  Location: [place];  Service: Orthopedics;  Laterality: Left;   Social History   Occupational History   Not on file  Tobacco Use   Smoking status: Former    Packs/day: 0.50    Types: Cigarettes    Quit date: 07/07/2022    Years since quitting: 0.2   Smokeless tobacco: Never  Vaping Use   Vaping Use: Never used  Substance and Sexual Activity   Alcohol use: No   Drug use: No    Comment: sometimes will take a vicodin from friend   Sexual activity: Never

## 2022-10-13 ENCOUNTER — Other Ambulatory Visit: Payer: Self-pay

## 2022-10-13 DIAGNOSIS — D235 Other benign neoplasm of skin of trunk: Secondary | ICD-10-CM

## 2022-11-12 DIAGNOSIS — I10 Essential (primary) hypertension: Secondary | ICD-10-CM | POA: Diagnosis not present

## 2022-11-12 DIAGNOSIS — E1169 Type 2 diabetes mellitus with other specified complication: Secondary | ICD-10-CM | POA: Diagnosis not present

## 2022-11-12 DIAGNOSIS — E785 Hyperlipidemia, unspecified: Secondary | ICD-10-CM | POA: Diagnosis not present

## 2022-11-12 DIAGNOSIS — J399 Disease of upper respiratory tract, unspecified: Secondary | ICD-10-CM | POA: Diagnosis not present

## 2022-11-12 DIAGNOSIS — Z96611 Presence of right artificial shoulder joint: Secondary | ICD-10-CM | POA: Diagnosis not present

## 2022-11-12 DIAGNOSIS — J441 Chronic obstructive pulmonary disease with (acute) exacerbation: Secondary | ICD-10-CM | POA: Diagnosis not present

## 2022-11-12 DIAGNOSIS — M1010 Lead-induced gout, unspecified site: Secondary | ICD-10-CM | POA: Diagnosis not present

## 2023-01-19 ENCOUNTER — Telehealth: Payer: Self-pay | Admitting: *Deleted

## 2023-01-19 NOTE — Telephone Encounter (Signed)
Call to patient- who has been difficult to reach; Tried a different cell number and was able to reach patient directly instead of her daughters.She reports from a shouler standpoint she is doing well S/P Reverse total shoulder replacement with Dr. Marlou Sa. She does have a cyst on her back that we looked at back in November and made referral for . Checked with [facility] and they attempted to call patient, but left messages with her daughters, who did not relay message. Gave patient's direct cell number to staff for referral completion and updated patient on this. She was grateful for assistance. Gave her [facility]'s number as well.  From shoulder standpoint- SANE assessment= 78% out of 100%

## 2023-02-02 DIAGNOSIS — Z1231 Encounter for screening mammogram for malignant neoplasm of breast: Secondary | ICD-10-CM | POA: Diagnosis not present

## 2023-02-05 ENCOUNTER — Ambulatory Visit: Payer: Medicare Other | Admitting: Orthopedic Surgery

## 2023-03-03 ENCOUNTER — Ambulatory Visit: Payer: Medicare Other | Admitting: Orthopedic Surgery

## 2023-03-03 DIAGNOSIS — M19011 Primary osteoarthritis, right shoulder: Secondary | ICD-10-CM

## 2023-03-03 MED ORDER — GABAPENTIN 100 MG PO CAPS: 100.0000 mg | ORAL_CAPSULE | Freq: Three times a day (TID) | ORAL | 0 refills | Status: AC

## 2023-03-07 ENCOUNTER — Encounter: Payer: Self-pay | Admitting: Orthopedic Surgery

## 2023-03-07 NOTE — Progress Notes (Signed)
Office Visit Note   Patient: Ellen Henry           Date of Birth: 06/12/1946           MRN: [id number] Visit Date: 03/03/2023 Requested by: Lucianne Lei, [address] [place],  [place] [address] PCP: Lucianne Lei, MD  Subjective: Chief Complaint  Patient presents with   Right Shoulder - Follow-up    HPI: Ellen Henry is a 77 y.o. female who presents to the office reporting mild right shoulder pain.  She underwent right reverse shoulder replacement 08/15/2022.  Hard for her to sleep on that side at times.  Has some occasional sharp pain but overall she is doing well without any real restrictions.  We can refill her gabapentin.  She uses a cane for her stroke on the left side..                ROS: All systems reviewed are negative as they relate to the chief complaint within the history of present illness.  Patient denies fevers or chills.  Assessment & Plan: Visit Diagnoses:  1. Arthritis of right shoulder region     Plan: Impression is well-functioning right reverse shoulder replacement with occasional episodic pain which is not bothering her too much.  She is done well with the surgery and is much more functional and has much less pain than she had prior to the surgery she will follow-up with [place] as needed.  Follow-Up Instructions: No follow-ups on file.   Orders:  No orders of the defined types were placed in this encounter.  Meds ordered this encounter  Medications   gabapentin (NEURONTIN) 100 MG capsule    Sig: Take 1 capsule (100 mg total) by mouth 3 (three) times daily.    Dispense:  90 capsule    Refill:  0      Procedures: No procedures performed   Clinical Data: No additional findings.  Objective: Vital Signs: There were no vitals taken for this visit.  Physical Exam:  Constitutional: Patient appears well-developed HEENT:  Head: Normocephalic Eyes:EOM are normal Neck: Normal range of motion Cardiovascular: Normal rate Pulmonary/chest:  Effort normal Neurologic: Patient is alert Skin: Skin is warm Psychiatric: Patient has normal mood and affect  Ortho Exam: Ortho exam demonstrates forward flexion and abduction of that right shoulder above shoulder level.  Deltoid is functional.  Shoulder stable.  Motor sensory function of the hand is intact.  She is able to get her hand behind her head.  Specialty Comments:  No specialty comments available.  Imaging: No results found.   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   History of laparoscopic cholecystectomy 08/15/2021   History of cholecystitis 08/15/2021   Status post total hip replacement, right 06/13/2021   Primary osteoarthritis of right hip 06/11/2021   Status post total replacement of right hip 06/11/2021   Complete tear of right rotator cuff 01/18/2018   Impingement syndrome of right shoulder 01/18/2018   AC (acromioclavicular) arthritis 01/18/2018   Rotator cuff tear 01/18/2018   Low oxygen saturation 12/09/2017   Chronic constipation    Failed back syndrome    Chronic pain syndrome    Radicular pain    Chronic bilateral low back pain without sciatica    Dysphagia, post-stroke    Left hemiparesis ([place])    Acute ischemic stroke ([place]) 12/15/2016  Urinary retention 02/25/2015   Primary osteoarthritis of left knee 02/15/2015   Morbid obesity ([place]) 02/15/2015   Essential hypertension, benign 09/07/2014   Hyperlipidemia 09/05/2014   Gout 09/05/2014   Neuropathy 09/05/2014   Radiculopathy 08/29/2014   Past Medical History:  Diagnosis Date   Acute kidney failure due to procedure    Arthritis    Complication of anesthesia    after nerve block difficult breathing (at [place] [facility] [address] transported to [facility])   Dry skin    GERD (gastroesophageal reflux disease)    "years ago", diet controlled   Gout    H/O pyelonephritis    as a child    Heart murmur    since birth, denies any problems. Echo 12/17/16   History of sepsis    after spinal surgery   Hypertension    Impingement syndrome of right shoulder    Neuropathy    Osteoarthritis of AC (acromioclavicular) joint    Right   Pneumonia    Radiculopathy    Rotator cuff tear, right    Sleep apnea    does not use cpap   Stroke ([place]) 12/15/2016   left side weakness    Family History  Problem Relation Age of Onset   Varicose Veins Mother     Past Surgical History:  Procedure Laterality Date   ABDOMINAL EXPOSURE N/A 08/29/2014   Procedure: ABDOMINAL EXPOSURE;  Surgeon: Rosetta Posner, MD;  Location: [place] [place];  Service: Vascular;  Laterality: N/A;   ABDOMINAL HYSTERECTOMY     ANTERIOR LUMBAR FUSION N/A 08/29/2014   Procedure: ANTERIOR LUMBAR FUSION 1 LEVEL;  Surgeon: Sinclair Ship, MD;  Location: [facility];  Service: Orthopedics;  Laterality: N/A;  Lumbar 4-5 anterior lumbar interbody fusion with allograft and instrumentation.   APPENDECTOMY     BACK SURGERY  08/28/2014 and 08/29/2014   lumbar fusion   CARPAL TUNNEL RELEASE Right    release done twice   CHOLECYSTECTOMY N/A 08/12/2021   Procedure: LAPAROSCOPIC CHOLECYSTECTOMY;  Surgeon: Jesusita Oka, MD;  Location: [facility];  Service: General;  Laterality: N/A;   COLONOSCOPY     lower intestine removal     REPLACEMENT TOTAL KNEE  2010   rigth knee   REVERSE SHOULDER ARTHROPLASTY Right 08/25/2022   Procedure: RIGHT REVERSE SHOULDER ARTHROPLASTY;  Surgeon: Meredith Pel, MD;  Location: [facility];  Service: Orthopedics;  Laterality: Right;   SHOULDER ARTHROSCOPY Right 01/18/2018   Procedure: RIGHT SHOULDER ARTHROSCOPY, SUBACROMIAL DECOMPRESSION, DISTAL CLAVICLE RESECTION WITH MINI OPEN ROTATOR CUFF REPAIR;  Surgeon: Garald Balding, MD;  Location: [facility];  Service: Orthopedics;  Laterality: Right;   TOE SURGERY Right    bone spur R. Great toe   TONSILLECTOMY     TOTAL HIP ARTHROPLASTY Right 06/11/2021   Procedure:  TOTAL HIP ARTHROPLASTY ANTERIOR APPROACH;  Surgeon: [name], MD;  Location: [facility];  Service: Orthopedics;  Laterality: Right;   TOTAL KNEE ARTHROPLASTY Left 02/15/2015   Procedure: TOTAL KNEE ARTHROPLASTY;  Surgeon: [name], MD;  Location: [facility];  Service: Orthopedics;  Laterality: Left;   Social History   Occupational History   Not on file  Tobacco Use   Smoking status: Former    Packs/day: .5    Types: Cigarettes    Quit date: 07/07/2022    Years since quitting: 0.6   Smokeless tobacco: Never  Vaping Use   Vaping Use: Never used  Substance and Sexual Activity   Alcohol use: No   Drug use:  No    Comment: sometimes will take a vicodin from friend   Sexual activity: Never

## 2023-03-15 DIAGNOSIS — E1169 Type 2 diabetes mellitus with other specified complication: Secondary | ICD-10-CM | POA: Diagnosis not present

## 2023-03-15 DIAGNOSIS — I10 Essential (primary) hypertension: Secondary | ICD-10-CM | POA: Diagnosis not present

## 2023-03-15 DIAGNOSIS — G63 Polyneuropathy in diseases classified elsewhere: Secondary | ICD-10-CM | POA: Diagnosis not present

## 2023-03-31 DIAGNOSIS — H2513 Age-related nuclear cataract, bilateral: Secondary | ICD-10-CM | POA: Diagnosis not present

## 2023-03-31 DIAGNOSIS — H524 Presbyopia: Secondary | ICD-10-CM | POA: Diagnosis not present

## 2023-04-26 ENCOUNTER — Other Ambulatory Visit: Payer: Self-pay | Admitting: Family Medicine

## 2023-04-26 DIAGNOSIS — E1169 Type 2 diabetes mellitus with other specified complication: Secondary | ICD-10-CM | POA: Diagnosis not present

## 2023-04-26 DIAGNOSIS — F17200 Nicotine dependence, unspecified, uncomplicated: Secondary | ICD-10-CM

## 2023-04-26 DIAGNOSIS — Z0001 Encounter for general adult medical examination with abnormal findings: Secondary | ICD-10-CM | POA: Diagnosis not present

## 2023-04-26 DIAGNOSIS — G63 Polyneuropathy in diseases classified elsewhere: Secondary | ICD-10-CM | POA: Diagnosis not present

## 2023-04-26 DIAGNOSIS — I10 Essential (primary) hypertension: Secondary | ICD-10-CM | POA: Diagnosis not present

## 2023-05-07 DIAGNOSIS — E349 Endocrine disorder, unspecified: Secondary | ICD-10-CM | POA: Diagnosis not present

## 2023-05-31 ENCOUNTER — Ambulatory Visit
Admission: RE | Admit: 2023-05-31 | Discharge: 2023-05-31 | Disposition: A | Discharge status: Home / Self Care | Payer: Medicare Other | Source: Ambulatory Visit | Attending: Family Medicine | Admitting: Family Medicine

## 2023-05-31 DIAGNOSIS — F17210 Nicotine dependence, cigarettes, uncomplicated: Secondary | ICD-10-CM | POA: Diagnosis not present

## 2023-05-31 DIAGNOSIS — F17200 Nicotine dependence, unspecified, uncomplicated: Secondary | ICD-10-CM

## 2023-06-07 DIAGNOSIS — E785 Hyperlipidemia, unspecified: Secondary | ICD-10-CM | POA: Diagnosis not present

## 2023-06-07 DIAGNOSIS — R918 Other nonspecific abnormal finding of lung field: Secondary | ICD-10-CM | POA: Diagnosis not present

## 2023-06-07 DIAGNOSIS — M8580 Other specified disorders of bone density and structure, unspecified site: Secondary | ICD-10-CM | POA: Diagnosis not present

## 2023-06-07 DIAGNOSIS — E1169 Type 2 diabetes mellitus with other specified complication: Secondary | ICD-10-CM | POA: Diagnosis not present

## 2023-06-07 DIAGNOSIS — M130 Polyarthritis, unspecified: Secondary | ICD-10-CM | POA: Diagnosis not present

## 2023-06-07 DIAGNOSIS — I272 Pulmonary hypertension, unspecified: Secondary | ICD-10-CM | POA: Diagnosis not present

## 2023-06-07 DIAGNOSIS — I10 Essential (primary) hypertension: Secondary | ICD-10-CM | POA: Diagnosis not present

## 2023-06-28 DIAGNOSIS — E1169 Type 2 diabetes mellitus with other specified complication: Secondary | ICD-10-CM | POA: Diagnosis not present

## 2023-06-28 DIAGNOSIS — Z720 Tobacco use: Secondary | ICD-10-CM | POA: Diagnosis not present

## 2023-06-28 DIAGNOSIS — R600 Localized edema: Secondary | ICD-10-CM | POA: Diagnosis not present

## 2023-06-28 DIAGNOSIS — I10 Essential (primary) hypertension: Secondary | ICD-10-CM | POA: Diagnosis not present

## 2023-06-28 DIAGNOSIS — C3490 Malignant neoplasm of unspecified part of unspecified bronchus or lung: Secondary | ICD-10-CM | POA: Diagnosis not present

## 2023-06-28 DIAGNOSIS — R7309 Other abnormal glucose: Secondary | ICD-10-CM | POA: Diagnosis not present

## 2023-07-04 DIAGNOSIS — Z1212 Encounter for screening for malignant neoplasm of rectum: Secondary | ICD-10-CM | POA: Diagnosis not present

## 2023-07-04 DIAGNOSIS — Z1211 Encounter for screening for malignant neoplasm of colon: Secondary | ICD-10-CM | POA: Diagnosis not present

## 2023-07-27 ENCOUNTER — Encounter: Payer: Self-pay | Admitting: Emergency Medicine

## 2023-07-27 ENCOUNTER — Ambulatory Visit (INDEPENDENT_AMBULATORY_CARE_PROVIDER_SITE_OTHER): Payer: Medicare Other | Admitting: Emergency Medicine

## 2023-07-27 VITALS — BP 126/80 | HR 78 | Ht 59.0 in | Wt 207.2 lb

## 2023-07-27 DIAGNOSIS — C3491 Malignant neoplasm of unspecified part of right bronchus or lung: Secondary | ICD-10-CM | POA: Insufficient documentation

## 2023-07-27 DIAGNOSIS — R911 Solitary pulmonary nodule: Secondary | ICD-10-CM

## 2023-07-27 NOTE — Progress Notes (Signed)
Subjective:    Patient ID: Ellen Henry, female    DOB: 1946-09-19, 77 y.o.   MRN: [id number]  HPI  77 year old smoker (25 pack years) with a history of hypertension, OSA not on CPAP, GERD, CVA.  She is referred today for evaluation of an abnormal CT scan of the chest.  She has a subsolid mixed density right upper lobe pulmonary nodule that was first identified on a CT of her right shoulder 02/11/2022, followed and was stable on CT chest 06/10/2022.  Most recent scan was a lung cancer screening CT done 05/31/2023 as below. She is still smoking 1-2 cig. No dyspnea, no SOB w exertion. She coughs but not every day, comes in spells, clear mucous. No BDs  CT scan of the chest 05/31/2023 reviewed by me, shows 1.7 cm mixed density pulmonary nodule with a solid central portion of 9 mm that is slightly increased in size compared with the priors.  Review of Systems As per HPI  Past Medical History:  Diagnosis Date   Acute kidney failure due to procedure    Arthritis    Complication of anesthesia    after nerve block difficult breathing ([facility] [address] [facility])   Dry skin    GERD (gastroesophageal reflux disease)    "years ago", diet controlled   Gout    H/O pyelonephritis    as a child   Heart murmur    since birth, denies any problems. Echo 12/17/16   History of sepsis    after spinal surgery   Hypertension    Impingement syndrome of right shoulder    Neuropathy    Osteoarthritis of AC (acromioclavicular) joint    Right   Pneumonia    Radiculopathy    Rotator cuff tear, right    Sleep apnea    does not use cpap   Stroke ([facility]) 12/15/2016   left side weakness     Family History  Problem Relation Age of Onset   Varicose Veins Mother      Social History   Socioeconomic History   Marital status: Widowed    Spouse name: Not on file   Number of children: 3   Years of education: Not on file   Highest education level: Not on file  Occupational  History   Not on file  Tobacco Use   Smoking status: Every Day    Current packs/day: 0.00    Types: Cigarettes    Last attempt to quit: 07/07/2022    Years since quitting: 1.0    Passive exposure: Current (1/2 pack)   Smokeless tobacco: Never  Vaping Use   Vaping status: Never Used  Substance and Sexual Activity   Alcohol use: No   Drug use: No    Comment: sometimes will take a vicodin from friend   Sexual activity: Never  Other Topics Concern   Not on file  Social History Narrative   ** Merged History Encounter **       Social Determinants of Health   Financial Resource Strain: Not on file  Food Insecurity: No Food Insecurity (08/25/2022)   Hunger Vital Sign    Worried About Running Out of Food in the Last Year: Never true    Ran Out of Food in the Last Year: Never true  Transportation Needs: No Transportation Needs (08/25/2022)   PRAPARE - Administrator, Civil Service (Medical): No    Lack of Transportation (Non-Medical): No  Physical Activity: Not  on file  Stress: Not on file  Social Connections: Not on file  Intimate Partner Violence: Not At Risk (08/25/2022)   Humiliation, Afraid, Rape, and Kick questionnaire    Fear of Current or Ex-Partner: No    Emotionally Abused: No    Physically Abused: No    Sexually Abused: No    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     aspirin EC 81 MG tablet Take 1 tablet (81 mg total) by mouth at bedtime. Swallow whole. 30 tablet 12   gabapentin (NEURONTIN) 100 MG capsule Take 1 capsule (100 mg total) by mouth 3 (three) times daily. 90 capsule 0   ibuprofen (ADVIL) 400 MG tablet Take 1 tablet (400 mg total) by mouth every 8 (eight) hours as  needed for mild pain or moderate pain (alternate with tylenol and oxycodone). 30 tablet 0   indapamide (LOZOL) 1.25 MG tablet Take 1.25 mg by mouth every morning.     rosuvastatin (CRESTOR) 20 MG tablet Take 20 mg by mouth at bedtime.     telmisartan (MICARDIS) 40 MG tablet Take 40 mg by mouth every morning.     acetaminophen (TYLENOL) 500 MG tablet Take 500 mg by mouth every 6 (six) hours as needed for mild pain or headache.     docusate sodium (COLACE) 100 MG capsule Take 1 capsule (100 mg total) by mouth 2 (two) times daily. 10 capsule 0   methocarbamol (ROBAXIN) 500 MG tablet Take 1 tablet (500 mg total) by mouth every 6 (six) hours as needed for muscle spasms. 30 tablet 0   nicotine (NICODERM CQ - DOSED IN MG/24 HOURS) 21 mg/24hr patch Place 21 mg onto the skin daily.     oxyCODONE (OXY IR/ROXICODONE) 5 MG immediate release tablet Take 1 tablet (5 mg total) by mouth every 12 (twelve) hours as needed for moderate pain (pain score 4-6). 30 tablet 0   Sennosides (EX-LAX PO) Take 2 tablets by mouth daily as needed (constipation).     No facility-administered medications prior to visit.        Objective:   Physical Exam  Vitals:   07/27/23 1508  BP: 126/80  Pulse: 78  SpO2: 98%  Weight: 207 lb 3.2 oz (94 kg)  Height: 4\' 11"  (1.499 m)    Gen: Pleasant, overweight woman, in no distress,  normal affect  ENT: No lesions,  mouth clear,  oropharynx clear, no postnasal drip  Neck: No JVD, no stridor  Lungs: No use of accessory muscles, no crackles or wheezing on normal respiration, no wheeze on forced expiration  Cardiovascular: RRR, heart sounds normal, no murmur or gallops, no peripheral edema  Musculoskeletal: No deformities, no cyanosis or clubbing  Neuro: alert, awake, non focal  Skin: Warm, no lesions or rash     Assessment & Plan:  Pulmonary nodule 1 cm or greater in diameter Slowly enlarging mixed density right upper lobe pulmonary nodule in a patient with history of  tobacco use.  High suspicion for an adenocarcinoma.  Discussed this with her today.  We talked about the different options including possible referral for primary resection.  We have decided to pursue robotic assisted navigational bronchoscopy to get  a tissue diagnosis.  Once we have a diagnosis we can talk about options for therapy, either surgical resection or SBRT.  We reviewed your CT scans of the chest today. We will arrange for robotic assisted navigational bronchoscopy to evaluate a small right upper lobe pulmonary nodule.  This will be done under general anesthesia as an outpatient at [facility] endoscopy.  You will need a designated driver and someone to watch you that day after the procedure.  You will need to stop your aspirin 2 days prior.  We will try to get this scheduled for 08/23/2023. You will need a repeat CT scan to your chest prior to your procedure.  We will order this today. Follow with T. Parrett in 1 month to review testing   Levy Pupa, MD, PhD 07/27/2023, 3:38 PM [facility] [phone] or if no answer before 7:00PM call [phone] For any issues after 7:00PM please call eLink [phone]

## 2023-07-27 NOTE — H&P (View-Only) (Signed)
Subjective:    Patient ID: Ellen Henry, female    DOB: [birth year], 77 y.o.   MRN: [id number]  HPI  77 year old smoker (25 pack years) with a history of hypertension, OSA not on CPAP, GERD, CVA.  She is referred today for evaluation of an abnormal CT scan of the chest.  She has a subsolid mixed density right upper lobe pulmonary nodule that was first identified on a CT of her right shoulder 02/11/2022, followed and was stable on CT chest 06/10/2022.  Most recent scan was a lung cancer screening CT done 05/31/2023 as below. She is still smoking 1-2 cig. No dyspnea, no SOB w exertion. She coughs but not every day, comes in spells, clear mucous. No BDs  CT scan of the chest 05/31/2023 reviewed by me, shows 1.7 cm mixed density pulmonary nodule with a solid central portion of 9 mm that is slightly increased in size compared with the priors.  Review of Systems As per HPI  Past Medical History:  Diagnosis Date   Acute kidney failure due to procedure    Arthritis    Complication of anesthesia    after nerve block difficult breathing ([facility] [place] [facility])   Dry skin    GERD (gastroesophageal reflux disease)    "years ago", diet controlled   Gout    H/O pyelonephritis    as a child   Heart murmur    since birth, denies any problems. Echo 12/17/16   History of sepsis    after spinal surgery   Hypertension    Impingement syndrome of right shoulder    Neuropathy    Osteoarthritis of AC (acromioclavicular) joint    Right   Pneumonia    Radiculopathy    Rotator cuff tear, right    Sleep apnea    does not use cpap   Stroke ([facility]) 12/15/2016   left side weakness     Family History  Problem Relation Age of Onset   Varicose Veins Mother      Social History   Socioeconomic History   Marital status: Widowed    Spouse name: Not on file   Number of children: 3   Years of education: Not on file   Highest education level: Not on file  Occupational  History   Not on file  Tobacco Use   Smoking status: Every Day    Current packs/day: 0.00    Types: Cigarettes    Last attempt to quit: 07/07/2022    Years since quitting: 1.0    Passive exposure: Current (1/2 pack)   Smokeless tobacco: Never  Vaping Use   Vaping status: Never Used  Substance and Sexual Activity   Alcohol use: No   Drug use: No    Comment: sometimes will take a vicodin from friend   Sexual activity: Never  Other Topics Concern   Not on file  Social History Narrative   ** Merged History Encounter **       Social Determinants of Health   Financial Resource Strain: Not on file  Food Insecurity: No Food Insecurity (08/25/2022)   Hunger Vital Sign    Worried About Running Out of Food in the Last Year: Never true    Ran Out of Food in the Last Year: Never true  Transportation Needs: No Transportation Needs (08/25/2022)   PRAPARE - Administrator, Civil Service (Medical): No    Lack of Transportation (Non-Medical): No  Physical Activity: Not  on file  Stress: Not on file  Social Connections: Not on file  Intimate Partner Violence: Not At Risk (08/25/2022)   Humiliation, Afraid, Rape, and Kick questionnaire    Fear of Current or Ex-Partner: No    Emotionally Abused: No    Physically Abused: No    Sexually Abused: No    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     aspirin EC 81 MG tablet Take 1 tablet (81 mg total) by mouth at bedtime. Swallow whole. 30 tablet 12   gabapentin (NEURONTIN) 100 MG capsule Take 1 capsule (100 mg total) by mouth 3 (three) times daily. 90 capsule 0   ibuprofen (ADVIL) 400 MG tablet Take 1 tablet (400 mg total) by mouth every 8 (eight) hours as  needed for mild pain or moderate pain (alternate with tylenol and oxycodone). 30 tablet 0   indapamide (LOZOL) 1.25 MG tablet Take 1.25 mg by mouth every morning.     rosuvastatin (CRESTOR) 20 MG tablet Take 20 mg by mouth at bedtime.     telmisartan (MICARDIS) 40 MG tablet Take 40 mg by mouth every morning.     acetaminophen (TYLENOL) 500 MG tablet Take 500 mg by mouth every 6 (six) hours as needed for mild pain or headache.     docusate sodium (COLACE) 100 MG capsule Take 1 capsule (100 mg total) by mouth 2 (two) times daily. 10 capsule 0   methocarbamol (ROBAXIN) 500 MG tablet Take 1 tablet (500 mg total) by mouth every 6 (six) hours as needed for muscle spasms. 30 tablet 0   nicotine (NICODERM CQ - DOSED IN MG/24 HOURS) 21 mg/24hr patch Place 21 mg onto the skin daily.     oxyCODONE (OXY IR/ROXICODONE) 5 MG immediate release tablet Take 1 tablet (5 mg total) by mouth every 12 (twelve) hours as needed for moderate pain (pain score 4-6). 30 tablet 0   Sennosides (EX-LAX PO) Take 2 tablets by mouth daily as needed (constipation).     No facility-administered medications prior to visit.        Objective:   Physical Exam  Vitals:   07/27/23 1508  BP: 126/80  Pulse: 78  SpO2: 98%  Weight: 207 lb 3.2 oz (94 kg)  Height: 4\' 11"  (1.499 m)    Gen: Pleasant, overweight woman, in no distress,  normal affect  ENT: No lesions,  mouth clear,  oropharynx clear, no postnasal drip  Neck: No JVD, no stridor  Lungs: No use of accessory muscles, no crackles or wheezing on normal respiration, no wheeze on forced expiration  Cardiovascular: RRR, heart sounds normal, no murmur or gallops, no peripheral edema  Musculoskeletal: No deformities, no cyanosis or clubbing  Neuro: alert, awake, non focal  Skin: Warm, no lesions or rash     Assessment & Plan:  Pulmonary nodule 1 cm or greater in diameter Slowly enlarging mixed density right upper lobe pulmonary nodule in a patient with history of  tobacco use.  High suspicion for an adenocarcinoma.  Discussed this with her today.  We talked about the different options including possible referral for primary resection.  We have decided to pursue robotic assisted navigational bronchoscopy to get  a tissue diagnosis.  Once we have a diagnosis we can talk about options for therapy, either surgical resection or SBRT.  We reviewed your CT scans of the chest today. We will arrange for robotic assisted navigational bronchoscopy to evaluate a small right upper lobe pulmonary nodule.  This will be done under general anesthesia as an outpatient at [facility] endoscopy.  You will need a designated driver and someone to watch you that day after the procedure.  You will need to stop your aspirin 2 days prior.  We will try to get this scheduled for 08/23/2023. You will need a repeat CT scan to your chest prior to your procedure.  We will order this today. Follow with T. Parrett in 1 month to review testing   Levy Pupa, MD, PhD 07/27/2023, 3:38 PM [facility] [phone] or if no answer before 7:00PM call [phone] For any issues after 7:00PM please call eLink [phone]

## 2023-07-27 NOTE — Assessment & Plan Note (Signed)
Slowly enlarging mixed density right upper lobe pulmonary nodule in a patient with history of tobacco use.  High suspicion for an adenocarcinoma.  Discussed this with her today.  We talked about the different options including possible referral for primary resection.  We have decided to pursue robotic assisted navigational bronchoscopy to get a tissue diagnosis.  Once we have a diagnosis we can talk about options for therapy, either surgical resection or SBRT.  We reviewed your CT scans of the chest today. We will arrange for robotic assisted navigational bronchoscopy to evaluate a small right upper lobe pulmonary nodule.  This will be done under general anesthesia as an outpatient at [facility] endoscopy.  You will need a designated driver and someone to watch you that day after the procedure.  You will need to stop your aspirin 2 days prior.  We will try to get this scheduled for 08/23/2023. You will need a repeat CT scan to your chest prior to your procedure.  We will order this today. Follow with T. Parrett in 1 month to review testing

## 2023-07-27 NOTE — Patient Instructions (Addendum)
We reviewed your CT scans of the chest today. We will arrange for robotic assisted navigational bronchoscopy to evaluate a small right upper lobe pulmonary nodule.  This will be done under general anesthesia as an outpatient at [facility] endoscopy.  You will need a designated driver and someone to watch you that day after the procedure.  You will need to stop your aspirin 2 days prior.  We will try to get this scheduled for 08/23/2023. You will need a repeat CT scan to your chest prior to your procedure.  We will order this today. Follow with T. Parrett in 1 month to review testing

## 2023-07-29 DIAGNOSIS — E1169 Type 2 diabetes mellitus with other specified complication: Secondary | ICD-10-CM | POA: Diagnosis not present

## 2023-07-29 DIAGNOSIS — I10 Essential (primary) hypertension: Secondary | ICD-10-CM | POA: Diagnosis not present

## 2023-07-29 DIAGNOSIS — C3490 Malignant neoplasm of unspecified part of unspecified bronchus or lung: Secondary | ICD-10-CM | POA: Diagnosis not present

## 2023-08-04 ENCOUNTER — Ambulatory Visit
Admission: RE | Admit: 2023-08-04 | Discharge: 2023-08-04 | Disposition: A | Discharge status: Home / Self Care | Payer: Medicare Other | Source: Ambulatory Visit | Attending: Emergency Medicine | Admitting: Emergency Medicine

## 2023-08-04 DIAGNOSIS — R911 Solitary pulmonary nodule: Secondary | ICD-10-CM | POA: Diagnosis not present

## 2023-08-04 DIAGNOSIS — I700 Atherosclerosis of aorta: Secondary | ICD-10-CM | POA: Diagnosis not present

## 2023-08-10 DIAGNOSIS — Z1211 Encounter for screening for malignant neoplasm of colon: Secondary | ICD-10-CM | POA: Diagnosis not present

## 2023-08-10 DIAGNOSIS — I10 Essential (primary) hypertension: Secondary | ICD-10-CM | POA: Diagnosis not present

## 2023-08-10 DIAGNOSIS — E782 Mixed hyperlipidemia: Secondary | ICD-10-CM | POA: Diagnosis not present

## 2023-08-10 DIAGNOSIS — Z86010 Personal history of colonic polyps: Secondary | ICD-10-CM | POA: Diagnosis not present

## 2023-08-10 DIAGNOSIS — R195 Other fecal abnormalities: Secondary | ICD-10-CM | POA: Diagnosis not present

## 2023-08-20 ENCOUNTER — Encounter (HOSPITAL_COMMUNITY): Payer: Self-pay | Admitting: Emergency Medicine

## 2023-08-20 NOTE — Progress Notes (Signed)
PCP - Geraldo Pitter, MD Cardiologist -denies  PPM/ICD - denies Device Orders - n/a Rep Notified - n/a  Chest x-ray -08/04/2023 EKG - 08/19/2022 Stress Test - denies ECHO - denies Cardiac Cath - denies  CPAP - denies  Fasting Blood Sugar - no DM    Blood Thinner Instructions: hold aspirin for 2 days prior to procedure. Last dose - 09/13.   ERAS Protcol - yes, till 0700 am   COVID TEST- no  Anesthesia review:   Patient verbally denies any shortness of breath, fever, cough and chest pain during phone call   -------------  SDW INSTRUCTIONS given:  Your procedure is scheduled on Monday, September 16 th  Report to [facility] Main Entrance "A" at 0730 A.M., and check in at the Admitting office.  Call this number if you have problems the morning of surgery:  [phone]   Remember:  Do not eat after midnight the night before your surgery  You may drink clear liquids until 0700 the morning of your surgery.   Clear liquids allowed are: Water, Non-Citrus Juices (without pulp), Carbonated Beverages, Clear Tea, Black Coffee Only, and Gatorade    Take these medicines the morning of surgery with A SIP OF WATER :              amLODipine (NORVASC)               gabapentin (NEURONTIN)   Follow your surgeon's instructions on when to stop Aspirin. Per Dr. Lavonna Monarch hold aspirin for 2 days.               As of today, STOP taking any Aleve, Naproxen, Ibuprofen, Motrin, Advil, Goody's, BC's, all herbal medications, fish oil, and all vitamins.                      Do not wear jewelry, make up, or nail polish            Do not wear lotions, powders, perfumes/colognes, or deodorant.            Do not shave 48 hours prior to surgery.  Men may shave face and neck.            Do not bring valuables to the hospital.            [facility] is not responsible for any belongings or valuables.  Do NOT Smoke (Tobacco/Vaping) 24 hours prior to your procedure If you use a CPAP at night, you may  bring all equipment for your overnight stay.   Contacts, glasses, dentures or bridgework may not be worn into surgery.      For patients admitted to the hospital, discharge time will be determined by your treatment team.   Patients discharged the day of surgery will not be allowed to drive home, and someone needs to stay with them for 24 hours.    Special instructions:   [facility]- Preparing For Surgery  Before surgery, you can play an important role. Because skin is not sterile, your skin needs to be as free of germs as possible. You can reduce the number of germs on your skin by washing with CHG (chlorahexidine gluconate) Soap before surgery.  CHG is an antiseptic cleaner which kills germs and bonds with the skin to continue killing germs even after washing.    Oral Hygiene is also important to reduce your risk of infection.  Remember - BRUSH YOUR TEETH THE MORNING OF SURGERY WITH  YOUR REGULAR TOOTHPASTE  Please do not use if you have an allergy to CHG or antibacterial soaps. If your skin becomes reddened/irritated stop using the CHG.  Do not shave (including legs and underarms) for at least 48 hours prior to first CHG shower. It is OK to shave your face.  Please follow these instructions carefully.   Shower the NIGHT BEFORE SURGERY and the MORNING OF SURGERY with DIAL Soap.   Pat yourself dry with a CLEAN TOWEL.  Wear CLEAN PAJAMAS to bed the night before surgery  Place CLEAN SHEETS on your bed the night of your first shower and DO NOT SLEEP WITH PETS.   Day of Surgery: Please shower morning of surgery  Wear Clean/Comfortable clothing the morning of surgery Do not apply any deodorants/lotions.   Remember to brush your teeth WITH YOUR REGULAR TOOTHPASTE.   Questions were answered. Patient verbalized understanding of instructions.

## 2023-08-20 NOTE — Progress Notes (Signed)
Anesthesia Chart Review: Ellen Henry  Case: [id number] Date/Time: 08/23/23 1000   Procedure: ROBOTIC ASSISTED NAVIGATIONAL BRONCHOSCOPY (Bilateral)   Anesthesia type: General   Pre-op diagnosis: RIGHT UPPER LOBE NODULE   [facility] RO[facility] 3 / [facility] ENDOS[facility]   Surgeons: Leslye Peer, MD       DISCUSSION: Patient is a 77 year old female scheduled for the above procedure. She has a slowly enlarging RUL lung nodule suspicious for adenocarcinoma. Robotic assisted navigational bronchoscopy recommended for tissue diagnosis and management options.   History includes smoking, HTN, murmur (mild TR, AV and MV calcification without regurgitation or stenosis 12/17/16), CVA (12/16/16, right), GERD, OSA (does not use CPAP), neuropathy, osteoarthritis (right TKA 11/27/09; left TKA 02/15/15; right THA 06/11/21; right reverse TSA 08/25/22), spinal surgery (L4-5 ALIF 08/29/14; L4-5 PSF 08/31/14, complicated by post-operative shock and AKI due to urosepsis and hypovolemia), cholecystectomy (08/12/21), gout. She had a interscalene nerve block on 12/09/17 in anticipation for right rotator cuff repair at the [facility], but procedure was aborted after she developed persistently low O2 saturations with dyspnea post-block. CXR in [facility] ED showed elevation of right hemidiaphragm with overlying vascular lean and this is likely due to phrenic nerve involvement from the nerve block. She was observed for several hours with O2/[place] as needed.  Dr. Delton Coombes advised that she hold aspirin 2 days prior to procedure.  She is a same-day workup.  Labs and EKG on arrival was indicated.  Anesthesia team to evaluate on the day of surgery.   VS:  Wt Readings from Last 3 Encounters:  07/27/23 94 kg  08/25/22 95.7 kg  08/19/22 95.7 kg   BP Readings from Last 3 Encounters:  07/27/23 126/80  08/26/22 (!) 140/81  08/19/22 130/79   Pulse Readings from Last 3 Encounters:  07/27/23 78  08/26/22 75  08/19/22  86    PROVIDERS: Renaye Rakers, MD is PCP  Levy Pupa, MD is pulmonologist   LABS: For day of surgery as indicated. Most recent results in [facility] include: Lab Results  Component Value Date   WBC 10.3 08/26/2022   HGB 9.3 (L) 08/26/2022   HCT 27.5 (L) 08/26/2022   PLT 172 08/26/2022   GLUCOSE 89 08/19/2022   NA 141 08/19/2022   K 3.7 08/19/2022   CL 111 08/19/2022   CREATININE 0.86 08/19/2022   BUN 15 08/19/2022   CO2 21 (L) 08/19/2022     IMAGES: CT Super D Chest 08/04/23: IMPRESSION: - Mildly progressive sub solid right upper lobe nodule, as described above, suspicious for invasive adenocarcinoma. - No evidence of metastatic disease in the chest. - Aortic Atherosclerosis (ICD10-I70.0).   EKG: Last EKG from 08/19/22 showed NSR   CV: Echo 12/17/16: Study Conclusions  - Left ventricle: The cavity size was normal. There was mild    concentric hypertrophy. Systolic function was normal. The    estimated ejection fraction was in the range of 60% to 65%. Wall    motion was normal; there were no regional wall motion    abnormalities. Doppler parameters are consistent with abnormal    left ventricular relaxation (grade 1 diastolic dysfunction).  - Aortic valve: Trileaflet; normal thickness, mildly calcified    leaflets. Valve area (VTI): 1.46 cm^2. Valve area (Vmax): 1.55    cm^2. Valve area (Vmean): 1.45 cm^2.  - Mitral valve: Mildly calcified annulus. Mildly calcified leaflets. - Left atrium: The atrium was moderately dilated.  - Right atrium: The atrium was mildly dilated.  Impressions:  - No cardiac source of emboli was indentified.    Past Medical History:  Diagnosis Date   Acute kidney failure due to procedure    Arthritis    Complication of anesthesia 12/09/2017   dyspnea post right interscalene nerve block (at [facility]) with likely phrenic nerve involement by Alicia Amel transported to [facility])   Dry skin    GERD (gastroesophageal reflux  disease)    "years ago", diet controlled   Gout    H/O pyelonephritis    as a child   Heart murmur    since birth, denies any problems. Echo 12/17/16   History of sepsis    after spinal surgery   Hypertension    Impingement syndrome of right shoulder    Neuropathy    Osteoarthritis of AC (acromioclavicular) joint    Right   Pneumonia    Radiculopathy    Rotator cuff tear, right    Sleep apnea    does not use cpap   Stroke (HCC) 12/15/2016   left side weakness    Past Surgical History:  Procedure Laterality Date   ABDOMINAL EXPOSURE N/A 08/29/2014   Procedure: ABDOMINAL EXPOSURE;  Surgeon: Larina Earthly, MD;  Location: [facility] OR;  Service: Vascular;  Laterality: N/A;   ABDOMINAL HYSTERECTOMY     ANTERIOR LUMBAR FUSION N/A 08/29/2014   Procedure: ANTERIOR LUMBAR FUSION 1 LEVEL;  Surgeon: Emilee Hero, MD;  [facility] OR;  Service: Orthopedics;  Laterality: N/A;  Lumbar 4-5 anterior lumbar interbody fusion with allograft and instrumentation.   APPENDECTOMY     BACK SURGERY  08/28/2014 and 08/29/2014   lumbar fusion   CARPAL TUNNEL RELEASE Right    release done twice   CHOLECYSTECTOMY N/A 08/12/2021   Procedure: LAPAROSCOPIC CHOLECYSTECTOMY;  Surgeon: Diamantina Monks, MD;  Location: [facility] OR;  Service: General;  Laterality: N/A;   COLONOSCOPY     lower intestine removal     REPLACEMENT TOTAL KNEE  2010   rigth knee   REVERSE SHOULDER ARTHROPLASTY Right 08/25/2022   Procedure: RIGHT REVERSE SHOULDER ARTHROPLASTY;  Surgeon: Cammy Copa, MD;  Location: [facility] OR;  Service: Orthopedics;  Laterality: Right;   SHOULDER ARTHROSCOPY Right 01/18/2018   Procedure: RIGHT SHOULDER ARTHROSCOPY, SUBACROMIAL DECOMPRESSION, DISTAL CLAVICLE RESECTION WITH MINI OPEN ROTATOR CUFF REPAIR;  Surgeon: Valeria Batman, MD;  Location: [facility] OR;  Service: Orthopedics;  Laterality: Right;   TOE SURGERY Right    bone spur R. Great toe   TONSILLECTOMY     TOTAL HIP ARTHROPLASTY Right 06/11/2021    Procedure: TOTAL HIP ARTHROPLASTY ANTERIOR APPROACH;  Surgeon: Jodi Geralds, MD;  Location: [facility] ORS;  Service: Orthopedics;  Laterality: Right;   TOTAL KNEE ARTHROPLASTY Left 02/15/2015   Procedure: TOTAL KNEE ARTHROPLASTY;  Surgeon: Jodi Geralds, MD;  Location: [facility] OR;  Service: Orthopedics;  Laterality: Left;    MEDICATIONS: No current facility-administered medications for this encounter.    amLODipine (NORVASC) 5 MG tablet   aspirin EC 81 MG tablet   gabapentin (NEURONTIN) 100 MG capsule   ibuprofen (ADVIL) 400 MG tablet   indapamide (LOZOL) 1.25 MG tablet   rosuvastatin (CRESTOR) 20 MG tablet   telmisartan (MICARDIS) 40 MG tablet    Shonna Chock, PA-C Surgical Short Stay/Anesthesiology [facility] Phone [phone] [facility] Phone [phone] 08/20/2023 10:52 AM

## 2023-08-20 NOTE — Anesthesia Preprocedure Evaluation (Signed)
Anesthesia Evaluation  Patient identified by MRN, date of birth, ID band Patient awake    Reviewed: Allergy & Precautions, H&P , NPO status , Patient's Chart, lab work & pertinent test results  Airway Mallampati: II   Neck ROM: full    Dental   Pulmonary sleep apnea , Current Smoker and Patient abstained from smoking.   breath sounds clear to auscultation       Cardiovascular hypertension,  Rhythm:regular Rate:Normal     Neuro/Psych CVA    GI/Hepatic ,GERD  ,,  Endo/Other    Renal/GU      Musculoskeletal  (+) Arthritis , Osteoarthritis,    Abdominal   Peds  Hematology   Anesthesia Other Findings   Reproductive/Obstetrics                             Anesthesia Physical Anesthesia Plan  ASA: 3  Anesthesia Plan: General   Post-op Pain Management:    Induction: Intravenous  PONV Risk Score and Plan: 2 and Ondansetron, Treatment may vary due to age or medical condition and Propofol infusion  Airway Management Planned: Oral ETT  Additional Equipment:   Intra-op Plan:   Post-operative Plan: Extubation in OR  Informed Consent: I have reviewed the patients History and Physical, chart, labs and discussed the procedure including the risks, benefits and alternatives for the proposed anesthesia with the patient or authorized representative who has indicated his/her understanding and acceptance.     Dental advisory given  Plan Discussed with: CRNA, Anesthesiologist and Surgeon  Anesthesia Plan Comments: (PAT note written 08/20/2023 by Shonna Chock, PA-C.  )       Anesthesia Quick Evaluation

## 2023-08-23 ENCOUNTER — Ambulatory Visit (HOSPITAL_COMMUNITY): Payer: Medicare Other

## 2023-08-23 ENCOUNTER — Ambulatory Visit (HOSPITAL_COMMUNITY)
Admission: RE | Admit: 2023-08-23 | Discharge: 2023-08-23 | Disposition: A | Discharge status: Home / Self Care | Payer: Medicare Other | Attending: Emergency Medicine | Admitting: Emergency Medicine

## 2023-08-23 ENCOUNTER — Ambulatory Visit (HOSPITAL_COMMUNITY): Payer: Medicare Other | Admitting: Vascular Surgery

## 2023-08-23 ENCOUNTER — Encounter (HOSPITAL_COMMUNITY)
Admission: RE | Disposition: A | Discharge status: Home / Self Care | Payer: Self-pay | Source: Home / Self Care | Attending: Emergency Medicine

## 2023-08-23 ENCOUNTER — Other Ambulatory Visit: Payer: Self-pay

## 2023-08-23 ENCOUNTER — Encounter (HOSPITAL_COMMUNITY): Payer: Self-pay | Admitting: Emergency Medicine

## 2023-08-23 DIAGNOSIS — G4733 Obstructive sleep apnea (adult) (pediatric): Secondary | ICD-10-CM | POA: Diagnosis not present

## 2023-08-23 DIAGNOSIS — Z96611 Presence of right artificial shoulder joint: Secondary | ICD-10-CM | POA: Diagnosis not present

## 2023-08-23 DIAGNOSIS — C3491 Malignant neoplasm of unspecified part of right bronchus or lung: Secondary | ICD-10-CM | POA: Diagnosis present

## 2023-08-23 DIAGNOSIS — R911 Solitary pulmonary nodule: Secondary | ICD-10-CM

## 2023-08-23 DIAGNOSIS — F17210 Nicotine dependence, cigarettes, uncomplicated: Secondary | ICD-10-CM | POA: Insufficient documentation

## 2023-08-23 DIAGNOSIS — I10 Essential (primary) hypertension: Secondary | ICD-10-CM | POA: Insufficient documentation

## 2023-08-23 DIAGNOSIS — C3490 Malignant neoplasm of unspecified part of unspecified bronchus or lung: Secondary | ICD-10-CM

## 2023-08-23 DIAGNOSIS — Z8673 Personal history of transient ischemic attack (TIA), and cerebral infarction without residual deficits: Secondary | ICD-10-CM | POA: Insufficient documentation

## 2023-08-23 DIAGNOSIS — C3411 Malignant neoplasm of upper lobe, right bronchus or lung: Secondary | ICD-10-CM | POA: Diagnosis not present

## 2023-08-23 DIAGNOSIS — I700 Atherosclerosis of aorta: Secondary | ICD-10-CM | POA: Diagnosis not present

## 2023-08-23 DIAGNOSIS — K219 Gastro-esophageal reflux disease without esophagitis: Secondary | ICD-10-CM | POA: Diagnosis not present

## 2023-08-23 HISTORY — PX: VIDEO BRONCHOSCOPY WITH RADIAL ENDOBRONCHIAL ULTRASOUND: SHX6849

## 2023-08-23 HISTORY — DX: Malignant neoplasm of unspecified part of unspecified bronchus or lung: C34.90

## 2023-08-23 HISTORY — PX: BRONCHIAL BIOPSY: SHX5109

## 2023-08-23 HISTORY — PX: BRONCHIAL NEEDLE ASPIRATION BIOPSY: SHX5106

## 2023-08-23 HISTORY — PX: FIDUCIAL MARKER PLACEMENT: SHX6858

## 2023-08-23 HISTORY — PX: BRONCHIAL BRUSHINGS: SHX5108

## 2023-08-23 SURGERY — BRONCHOSCOPY, WITH BIOPSY USING ELECTROMAGNETIC NAVIGATION
Anesthesia: General | Laterality: Bilateral

## 2023-08-23 MED ORDER — CHLORHEXIDINE GLUCONATE 0.12 % MT SOLN
15.0000 mL | Freq: Once | OROMUCOSAL | Status: AC
  Administered 2023-08-23: 15 mL via OROMUCOSAL
  Filled 2023-08-23: qty 15

## 2023-08-23 MED ORDER — LIDOCAINE 2% (20 MG/ML) 5 ML SYRINGE
INTRAMUSCULAR | Status: DC | PRN
  Administered 2023-08-23: 60 mg via INTRAVENOUS

## 2023-08-23 MED ORDER — FENTANYL CITRATE (PF) 250 MCG/5ML IJ SOLN
INTRAMUSCULAR | Status: DC | PRN
  Administered 2023-08-23: 50 ug via INTRAVENOUS

## 2023-08-23 MED ORDER — PROPOFOL 500 MG/50ML IV EMUL
INTRAVENOUS | Status: DC | PRN
Start: 2023-08-23 — End: 2023-08-23
  Administered 2023-08-23: 110 ug/kg/min via INTRAVENOUS

## 2023-08-23 MED ORDER — OXYCODONE HCL 5 MG/5ML PO SOLN: 5.0000 mg | Freq: Once | ORAL | Status: DC | PRN

## 2023-08-23 MED ORDER — ROCURONIUM BROMIDE 10 MG/ML (PF) SYRINGE
PREFILLED_SYRINGE | INTRAVENOUS | Status: DC | PRN
  Administered 2023-08-23: 50 mg via INTRAVENOUS

## 2023-08-23 MED ORDER — LACTATED RINGERS IV SOLN: INTRAVENOUS | Status: DC

## 2023-08-23 MED ORDER — FENTANYL CITRATE (PF) 100 MCG/2ML IJ SOLN
INTRAMUSCULAR | Status: AC
  Filled 2023-08-23: qty 2

## 2023-08-23 MED ORDER — ONDANSETRON HCL 4 MG/2ML IJ SOLN
INTRAMUSCULAR | Status: DC | PRN
  Administered 2023-08-23: 4 mg via INTRAVENOUS

## 2023-08-23 MED ORDER — PROPOFOL 10 MG/ML IV BOLUS
INTRAVENOUS | Status: DC | PRN
  Administered 2023-08-23: 20 mg via INTRAVENOUS
  Administered 2023-08-23: 100 mg via INTRAVENOUS

## 2023-08-23 MED ORDER — FENTANYL CITRATE (PF) 100 MCG/2ML IJ SOLN: 25.0000 ug | INTRAMUSCULAR | Status: DC | PRN

## 2023-08-23 MED ORDER — ONDANSETRON HCL 4 MG/2ML IJ SOLN: 4.0000 mg | Freq: Four times a day (QID) | INTRAMUSCULAR | Status: DC | PRN

## 2023-08-23 MED ORDER — OXYCODONE HCL 5 MG PO TABS: 5.0000 mg | ORAL_TABLET | Freq: Once | ORAL | Status: DC | PRN

## 2023-08-23 SURGICAL SUPPLY — 1 items: superlock fiducial IMPLANT

## 2023-08-23 NOTE — Discharge Instructions (Addendum)
Flexible Bronchoscopy, Care After This sheet gives you information about how to care for yourself after your test. Your doctor may also give you more specific instructions. If you have problems or questions, contact your doctor. Follow these instructions at home: Eating and drinking When your numbness is gone and your cough and gag reflexes have come back, you may: Eat only soft foods. Slowly drink liquids. The day after the test, go back to your normal diet. Driving Do not drive for 24 hours if you were given a medicine to help you relax (sedative). Do not drive or use heavy machinery while taking prescription pain medicine. General instructions  Take over-the-counter and prescription medicines only as told by your doctor. Return to your normal activities as told. Ask what activities are safe for you. Do not use any products that have nicotine or tobacco in them. This includes cigarettes and e-cigarettes. If you need help quitting, ask your doctor. Keep all follow-up visits as told by your doctor. This is important. It is very important if you had a tissue sample (biopsy) taken. Get help right away if: You have shortness of breath that gets worse. You get light-headed. You feel like you are going to pass out (faint). You have chest pain. You cough up: More than a little blood. More blood than before. Summary Do not eat or drink anything (not even water) for 2 hours after your test, or until your numbing medicine wears off. Do not use cigarettes. Do not use e-cigarettes. Get help right away if you have chest pain.  Please call our office for any questions or concerns.  [phone].  This information is not intended to replace advice given to you by your health care provider. Make sure you discuss any questions you have with your health care provider. Document Released: 09/20/2009 Document Revised: 11/05/2017 Document Reviewed: 12/11/2016 Elsevier Patient Education  2020 Elsevier  Inc.  

## 2023-08-23 NOTE — Research (Signed)
Title: A multi-center, prospective, single-arm, observational study to evaluate real-world outcomes for the shape-sensing Ion endoluminal system  Primary Outcome: Evaluate procedure characteristics and short and long-term patient outcomes following shape-sensing robotic-assisted bronchoscopy (ssRAB) utilizing the Ion Endoluminal System for lung lesion localization or biopsy.   Protocol # / Study Name: ISI-ION-003 Clinical Trials #: [id number] Sponsor: [employer] Principal Investigator: Dr. Elige Radon Icard  Key Features of Ion Endoluminal System (referred to as "Ion") Ion is the first FDA cleared bronchoscopy system that uses fiber optic shape sensing technology to inform on location within the airways. Its catheter/tool channel has a smaller outer diameter (3.5 mm) in comparison to conventional bronchoscopes, allowing it to navigate into the smaller airways of the periphery.     Key Inclusion Criteria Subject is 18 years or older at the time of the procedure Subject is a candidate for a planned, elective RAB lung lesion localization or biopsy procedure in which the Ion Endoluminal System is planned to be utilized.  Subject  able to understand and adhere to study requirements and provide informed consent.   Key Exclusion Criteria Subject is under the care of a Museum/gallery exhibitions officer and is unable to provide informed consent on their own accord.  Subject is participating in an interventional research study or research study with investigational agents with an unknown safety profile that would interfere with participation or the results of this study.  Female subjects who are pregnant or nursing at the time of the index Ion procedure, as determined by standard site practices. Subjects that are incarcerated or institutionalized under court order, or other vulnerable populations.    Previous Clinical Trials Since receiving FDA clearance in Feb 2019, Ion has been adopted  commercially by over 226 centers in the [place], and utilized in over 40,000 procedures.  The first in-human study enrolled 49 subjects with a mean lesion size of 14.8 mm and the overall diagnostic yield was 79.3%, with no adverse events. 17 (58.6%) lesions were reported to have a bronchus sign available on CT imaging.  A multi-center study published results in 2022, with 270 lesions biopsied in 241 patients using Ion. The mean largest cardinal lesion size was 18.86.36mm, and the mean airway generation count was 7.01.6. Asymptomatic pneumothorax occurred in 3.3% of subjects, and 0.8% experienced airway bleeding.   Another study provided preliminary results in 2022, with 87% sensitivity for malignancy, a diagnostic yield of 81%, and a mean lesion size of 16 mm. 75% of biopsy cases were bronchus-sign negative. 4% of subjects experienced pneumothoraces (including those requiring intervention), and 0.8% of subjects experienced airway bleeding requiring wedging or balloon tamponade.  A single-center study captured 131 consecutive procedures of pulmonary biopsy using Ion. The navigational success rate was 98.7%, with an overall diagnostic yield of 81.7%, an overall complication rate of 3%, and a pneumothorax rate of 1.5%.   [facility] [place] Clinical Research Coordinator note:   This visit for Subject Ellen Henry with DOB: 13-Jan-1946 on 08/23/2023 for the above protocol is Visit/Encounter # Pre-procedure, Intra-procedure and Post-procedure, and is for purpose of research.   The consent for this encounter is under:  Protocol Version 1.0 Investigator Brochure Version N/A Consent Version Revision A, dated 14Nov2023 and is currently IRB approved.   Ellen Henry expressed continued interest and consent in continuing as a study subject. Subject confirmed that there was no change in contact information (e.g. address, telephone, email). Subject thanked for participation in research and contribution to  science. In this  visit 08/23/2023 the subject will be evaluated by Sub-Investigator named Dr. Delton Coombes. This research coordinator has verified that the above investigator is up to date with his/her training logs.   The Subject was informed that the PI continues to have oversight of the subject's visits and course through relevant discussions, reviews, and also specifically of this visit by routing of this note to the PI.  The research study was discussed with the subject in the pre-operative room. The study was explained in detail including all the contents of the informed consent document. The subject was encouraged to ask questions. All questions were answered to their satisfaction. The IRB approved informed consent was signed, and a copy was given to the subject. After obtaining consent, the subject underwent scheduled procedure using the ion endoluminal system. Data collection was completed per protocol. Refer to paper source subject binder for further details.      Signed by  Verdene Lennert Clinical Research Coordinator / Sub-Investigator  [facility]  [place], [place] 19:14 AM 08/23/2023

## 2023-08-23 NOTE — Op Note (Signed)
Video Bronchoscopy with Robotic Assisted Bronchoscopic Navigation   Date of Operation: 08/23/2023   Pre-op Diagnosis: Right upper lobe groundglass nodule  Post-op Diagnosis: Same  Surgeon: Levy Pupa  Assistants: None  Anesthesia: General endotracheal anesthesia  Operation: Flexible video fiberoptic bronchoscopy with robotic assistance and biopsies.  Estimated Blood Loss: Minimal  Complications: None  Indications and History: Ellen Henry is a 76 y.o. female with history of tobacco use.  She was found to have right upper lobe pulmonary nodule on lung cancer screening evaluations.  Recommendation made to achieve a tissue diagnosis via robotic assisted navigational bronchoscopy. The risks, benefits, complications, treatment options and expected outcomes were discussed with the patient.  The possibilities of pneumothorax, pneumonia, reaction to medication, pulmonary aspiration, perforation of a viscus, bleeding, failure to diagnose a condition and creating a complication requiring transfusion or operation were discussed with the patient who freely signed the consent.    Description of Procedure: The patient was seen in the Preoperative Area, was examined and was deemed appropriate to proceed.  The patient was taken to [facility] endoscopy room 3, identified as Ellen Henry and the procedure verified as Flexible Video Fiberoptic Bronchoscopy.  A Time Out was held and the above information confirmed.   Prior to the date of the procedure a high-resolution CT scan of the chest was performed. Utilizing ION software program a virtual tracheobronchial tree was generated to allow the creation of distinct navigation pathways to the patient's parenchymal abnormalities. After being taken to the operating room general anesthesia was initiated and the patient  was orally intubated. The video fiberoptic bronchoscope was introduced via the endotracheal tube and a general inspection was performed which  showed normal right and left lung anatomy. Aspiration of the bilateral mainstems was completed to remove any remaining secretions. Robotic catheter inserted into patient's endotracheal tube.   Target #1 right upper lobe groundglass pulmonary nodule: The distinct navigation pathways prepared prior to this procedure were then utilized to navigate to patient's lesion identified on CT scan. The robotic catheter was secured into place and the vision probe was withdrawn.  Lesion location was approximated using fluoroscopy and radial endobronchial ultrasound for peripheral targeting.  Local registration and targeting was performed using Cios three-dimensional imaging.  Under fluoroscopic guidance transbronchial needle brushings, transbronchial needle biopsies, and transbronchial forceps biopsies were performed to be sent for cytology and pathology.  Under fluoroscopic guidance a single fiducial marker was placed adjacent to the nodule.  At the end of the procedure a general airway inspection was performed and there was no evidence of active bleeding. The bronchoscope was removed.  The patient tolerated the procedure well. There was no significant blood loss and there were no obvious complications. A post-procedural chest x-ray is pending.  Samples Target #1: 1. Transbronchial needle brushings from right upper lobe groundglass pulmonary nodule 2. Transbronchial Wang needle biopsies from right upper lobe groundglass nodule 3. Transbronchial forceps biopsies from right upper lobe groundglass nodule  Plans:  The patient will be discharged from the PACU to home when recovered from anesthesia and after chest x-ray is reviewed. We will review the cytology, pathology and microbiology results with the patient when they become available. Outpatient followup will be with Dr. Delton Coombes.    Levy Pupa, MD, PhD 08/23/2023, 11:16 AM [facility] [phone] or if no answer before 7:00PM call  [phone] For any issues after 7:00PM please call eLink [phone]

## 2023-08-23 NOTE — Interval H&P Note (Signed)
History and Physical Interval Note:  08/23/2023 8:40 AM  Ellen Henry  has presented today for surgery, with the diagnosis of RIGHT UPPER LOBE NODULE.  The various methods of treatment have been discussed with the patient and family. After consideration of risks, benefits and other options for treatment, the patient has consented to  Procedure(s): ROBOTIC ASSISTED NAVIGATIONAL BRONCHOSCOPY (Bilateral) as a surgical intervention.  The patient's history has been reviewed, patient examined, no change in status, stable for surgery.  I have reviewed the patient's chart and labs.  Questions were answered to the patient's satisfaction.     Leslye Peer

## 2023-08-23 NOTE — Transfer of Care (Signed)
Immediate Anesthesia Transfer of Care Note  Patient: Ellen Henry  Procedure(s) Performed: ROBOTIC ASSISTED NAVIGATIONAL BRONCHOSCOPY (Bilateral) BRONCHIAL BRUSHINGS VIDEO BRONCHOSCOPY WITH RADIAL ENDOBRONCHIAL ULTRASOUND BRONCHIAL NEEDLE ASPIRATION BIOPSIES BRONCHIAL BIOPSIES FIDUCIAL MARKER PLACEMENT  Patient Location: PACU  Anesthesia Type:General  Level of Consciousness: awake, alert , and patient cooperative  Airway & Oxygen Therapy: Patient Spontanous Breathing and Patient connected to nasal cannula oxygen  Post-op Assessment: Report given to RN and Post -op Vital signs reviewed and stable  Post vital signs: Reviewed and stable  Last Vitals:  Vitals Value Taken Time  BP    Temp    Pulse 69 08/23/23 1117  Resp 13 08/23/23 1117  SpO2 97 % 08/23/23 1117  Vitals shown include unfiled device data.  Last Pain:  Vitals:   08/23/23 0743  TempSrc:   PainSc: 5          Complications: No notable events documented.

## 2023-08-24 ENCOUNTER — Encounter (HOSPITAL_COMMUNITY): Payer: Self-pay | Admitting: Emergency Medicine

## 2023-08-26 ENCOUNTER — Telehealth: Payer: Self-pay | Admitting: Emergency Medicine

## 2023-08-26 NOTE — Telephone Encounter (Signed)
Calling for updates about Radiation that her & Dr. Delton Coombes discussed

## 2023-08-26 NOTE — Anesthesia Postprocedure Evaluation (Signed)
Anesthesia Post Note  Patient: Ellen Henry  Procedure(s) Performed: ROBOTIC ASSISTED NAVIGATIONAL BRONCHOSCOPY (Bilateral) BRONCHIAL BRUSHINGS VIDEO BRONCHOSCOPY WITH RADIAL ENDOBRONCHIAL ULTRASOUND BRONCHIAL NEEDLE ASPIRATION BIOPSIES BRONCHIAL BIOPSIES FIDUCIAL MARKER PLACEMENT     Patient location during evaluation: PACU Anesthesia Type: General Level of consciousness: awake and alert Pain management: pain level controlled Vital Signs Assessment: post-procedure vital signs reviewed and stable Respiratory status: spontaneous breathing, nonlabored ventilation, respiratory function stable and patient connected to nasal cannula oxygen Cardiovascular status: blood pressure returned to baseline and stable Postop Assessment: no apparent nausea or vomiting Anesthetic complications: no   No notable events documented.  Last Vitals:  Vitals:   08/23/23 1145 08/23/23 1200  BP: 115/73 110/64  Pulse: 62 61  Resp: (!) 9 16  Temp:  36.4 C  SpO2: 99% 99%    Last Pain:  Vitals:   08/23/23 1145  TempSrc:   PainSc: 0-No pain                 Petro Talent S

## 2023-08-27 ENCOUNTER — Telehealth: Payer: Self-pay | Admitting: Emergency Medicine

## 2023-08-27 DIAGNOSIS — C3490 Malignant neoplasm of unspecified part of unspecified bronchus or lung: Secondary | ICD-10-CM

## 2023-08-27 NOTE — Telephone Encounter (Signed)
Patient wants to know the results of her biopsy. She knows she has cancer but wants to know which stage she is in.

## 2023-08-27 NOTE — Telephone Encounter (Signed)
Patient called me back.  I was able to give her the information about adenocarcinoma.  She needs a PET scan and a referral to radiation oncology (she tells me that she would prefer SBRT to surgery, may still be able to discuss these options).  She is supposed to see T Parrett in our office next week to ensure that the plan is in place.

## 2023-08-27 NOTE — Telephone Encounter (Signed)
Called the patient to discuss her biopsy results.  No answer, did leave a voicemail.  I will have to call her back.  Her biopsies show adenocarcinoma and she will likely need a PET scan, referral to radiation oncology.

## 2023-08-31 ENCOUNTER — Ambulatory Visit: Payer: Medicare Other | Admitting: Adult Health

## 2023-08-31 ENCOUNTER — Telehealth: Payer: Self-pay | Admitting: Adult Health

## 2023-08-31 NOTE — Telephone Encounter (Signed)
Daughter is also this PT's caregiver. She needs a letter from [place] saying she is her caregiver and that she will be taking her to any future appts.  Pls call her @ [phone]

## 2023-09-01 NOTE — Telephone Encounter (Signed)
Please let her know that the PET scan and RAd Onc referrals have been made. Please check to confirm when these are so we can inform her when / where to go

## 2023-09-01 NOTE — Telephone Encounter (Signed)
Pt got confused and missed her appt with TP on yesterday. Patient was rescheduled for 11/11//24. Will that appt be ok? She was inquiring about the plan she had discussed with Dr.Byrum PET scan and radiation oncology as she has not heard back.

## 2023-09-02 ENCOUNTER — Other Ambulatory Visit: Payer: Self-pay

## 2023-09-02 NOTE — Telephone Encounter (Signed)
Referrals placed pt missed appt with TP.

## 2023-09-02 NOTE — Telephone Encounter (Signed)
Patient aware orders placed and that she will be contacted.

## 2023-09-03 NOTE — Progress Notes (Signed)
The proposed treatment discussed in conference is for discussion purpose only and is not a binding recommendation.  The patients have not been physically examined, or presented with their treatment options.  Therefore, final treatment plans cannot be decided.  

## 2023-09-06 DIAGNOSIS — K5730 Diverticulosis of large intestine without perforation or abscess without bleeding: Secondary | ICD-10-CM | POA: Diagnosis not present

## 2023-09-06 DIAGNOSIS — Z86010 Personal history of colonic polyps: Secondary | ICD-10-CM | POA: Diagnosis not present

## 2023-09-06 DIAGNOSIS — K648 Other hemorrhoids: Secondary | ICD-10-CM | POA: Diagnosis not present

## 2023-09-06 DIAGNOSIS — R195 Other fecal abnormalities: Secondary | ICD-10-CM | POA: Diagnosis not present

## 2023-09-06 DIAGNOSIS — Z1211 Encounter for screening for malignant neoplasm of colon: Secondary | ICD-10-CM | POA: Diagnosis not present

## 2023-09-06 NOTE — Telephone Encounter (Signed)
Patient's daughter is calling in reference to a form that needs to be filled out for her job.

## 2023-09-06 NOTE — Telephone Encounter (Signed)
F/u    Patient daughter Ellen Henry checking on the status of form.   Daughter is saying the form should of have been fax from third party [employer] last week.   Deadline 09/10/2023.   Asking for a call back today to discuss.

## 2023-09-07 NOTE — Telephone Encounter (Signed)
Message routed to [facility] to follow up.

## 2023-09-08 NOTE — Telephone Encounter (Signed)
Pt daughter calling to speak to Production designer, theatre/television/film

## 2023-09-09 ENCOUNTER — Telehealth: Payer: Self-pay | Admitting: Emergency Medicine

## 2023-09-09 NOTE — Telephone Encounter (Signed)
New message   Levy Sjogren at the desk, dropped off [employer] forms to be completed by Dr. Inis Sizer.   The forms are for Ellen Henry - daughter   FMLA Forms are placed in Ellen Henry's box.

## 2023-09-10 DIAGNOSIS — I10 Essential (primary) hypertension: Secondary | ICD-10-CM | POA: Diagnosis not present

## 2023-09-10 DIAGNOSIS — R051 Acute cough: Secondary | ICD-10-CM | POA: Diagnosis not present

## 2023-09-10 DIAGNOSIS — C3490 Malignant neoplasm of unspecified part of unspecified bronchus or lung: Secondary | ICD-10-CM | POA: Diagnosis not present

## 2023-09-10 DIAGNOSIS — E1169 Type 2 diabetes mellitus with other specified complication: Secondary | ICD-10-CM | POA: Diagnosis not present

## 2023-09-10 DIAGNOSIS — M1250 Traumatic arthropathy, unspecified site: Secondary | ICD-10-CM | POA: Diagnosis not present

## 2023-09-13 ENCOUNTER — Telehealth: Payer: Self-pay | Admitting: Adult Health

## 2023-09-13 NOTE — Telephone Encounter (Signed)
Daughter wants a CB regarding her mom. [phone]. She would not say what it regards.

## 2023-09-15 ENCOUNTER — Telehealth: Payer: Self-pay | Admitting: Adult Health

## 2023-09-15 DIAGNOSIS — Z0289 Encounter for other administrative examinations: Secondary | ICD-10-CM

## 2023-09-15 NOTE — Telephone Encounter (Signed)
Daughter calling again asking for office manager. Tried to get Darilyn by Teams but unavailable. Pls call daughter @ [phone]. States no one is calling her back.

## 2023-09-15 NOTE — Telephone Encounter (Signed)
I have called the patient's daughter back and left a voice message, gave her my direct phone number and stated I am calling back about the [facility] paperwork.  If she calls back, please transfer her to me.

## 2023-09-15 NOTE — Telephone Encounter (Signed)
Spoke to patient's daughter, Diea Tenny Craw about FMLA form needed so she can be paid for taking her mother to medical appointments.  I explained that Rubye Oaks, NP can only sign for the time taken to bring patient to appointments at this office.  Once a plan of care has been established with the ongologist, that provider will need to complete another FMLA form to document the time it will take for Ms. Ross to take care of her mother.  Form was filled in for 6-8 appointments/year taking approx. 8 hours to cover transportation and care of patient following the appointments.  Form given to T. Parrett for signature.  $29 processing fee added to the account.

## 2023-09-16 ENCOUNTER — Encounter (HOSPITAL_COMMUNITY)
Admission: RE | Admit: 2023-09-16 | Discharge: 2023-09-16 | Disposition: A | Discharge status: Home / Self Care | Payer: Medicare Other | Source: Ambulatory Visit | Attending: Emergency Medicine | Admitting: Emergency Medicine

## 2023-09-16 DIAGNOSIS — C3490 Malignant neoplasm of unspecified part of unspecified bronchus or lung: Secondary | ICD-10-CM | POA: Diagnosis not present

## 2023-09-16 DIAGNOSIS — Z96611 Presence of right artificial shoulder joint: Secondary | ICD-10-CM | POA: Diagnosis not present

## 2023-09-16 DIAGNOSIS — Z96641 Presence of right artificial hip joint: Secondary | ICD-10-CM | POA: Diagnosis not present

## 2023-09-16 DIAGNOSIS — M47815 Spondylosis without myelopathy or radiculopathy, thoracolumbar region: Secondary | ICD-10-CM | POA: Diagnosis not present

## 2023-09-16 LAB — GLUCOSE, CAPILLARY: Glucose-Capillary: 80 mg/dL (ref 70–99)

## 2023-09-16 MED ORDER — FLUDEOXYGLUCOSE F - 18 (FDG) INJECTION
10.5000 | Freq: Once | INTRAVENOUS | Status: AC | PRN
  Administered 2023-09-16: 11.24 via INTRAVENOUS

## 2023-09-16 NOTE — Telephone Encounter (Signed)
Called and spoke with Ellen Henry, she states she received a call from Darilyn yesterday.  I advised her that she would be the one that communicates with her regarding the paperwork.  I also apologized for the delay in her call being returned.  Nothing further needed.

## 2023-09-20 NOTE — Telephone Encounter (Signed)
Patient's daughter called to say her employer has not received the signed FMLA form.  I've printed it again and sent it to Rubye Oaks, NP for signature.

## 2023-09-20 NOTE — Telephone Encounter (Signed)
Signed FMLA form was faxed to [employer] fax# [phone].  I called daughter 'Diea Ross and then emailed her a hard copy to [email or web address].

## 2023-09-21 NOTE — Progress Notes (Signed)
Thoracic Location of Tumor / Histology: RUL Lung  Patient presented with RUL Lung nodule that was picked up on CT scan of her right shoulder that has been followed since 02/11/2022.   PET 09/16/2023:  CT 08/04/2023:   Biopsies of RUL Lung 08/23/2023   Past/Anticipated interventions by pulmonary, if any:  Dr. Delton Coombes 07/27/2023 -Once we have a diagnosis we can talk about options for therapy, either surgical resection or SBRT.    Past/Anticipated interventions by cardiothoracic surgery, if any:   Past/Anticipated interventions by medical oncology, if any:    Tobacco/Marijuana/Snuff/ETOH use: Current Smoker  Signs/Symptoms Weight changes, if any:  Respiratory complaints, if any:  Hemoptysis, if any:  Pain issues, if any:    SAFETY ISSUES: Prior radiation?  Pacemaker/ICD?   Possible current pregnancy? Is the patient on methotrexate?   Current Complaints / other details:

## 2023-09-22 ENCOUNTER — Ambulatory Visit
Admission: RE | Admit: 2023-09-22 | Discharge: 2023-09-22 | Disposition: A | Discharge status: Home / Self Care | Payer: Medicare Other | Source: Ambulatory Visit | Attending: Radiation Oncology | Admitting: Radiation Oncology

## 2023-09-22 ENCOUNTER — Encounter: Payer: Self-pay | Admitting: Radiation Oncology

## 2023-09-22 VITALS — BP 155/89 | HR 66 | Temp 97.7°F | Resp 20 | Ht 59.0 in | Wt 222.2 lb

## 2023-09-22 DIAGNOSIS — G4730 Sleep apnea, unspecified: Secondary | ICD-10-CM | POA: Diagnosis not present

## 2023-09-22 DIAGNOSIS — K219 Gastro-esophageal reflux disease without esophagitis: Secondary | ICD-10-CM | POA: Diagnosis not present

## 2023-09-22 DIAGNOSIS — F17210 Nicotine dependence, cigarettes, uncomplicated: Secondary | ICD-10-CM | POA: Insufficient documentation

## 2023-09-22 DIAGNOSIS — M1990 Unspecified osteoarthritis, unspecified site: Secondary | ICD-10-CM | POA: Diagnosis not present

## 2023-09-22 DIAGNOSIS — R011 Cardiac murmur, unspecified: Secondary | ICD-10-CM | POA: Insufficient documentation

## 2023-09-22 DIAGNOSIS — G629 Polyneuropathy, unspecified: Secondary | ICD-10-CM | POA: Diagnosis not present

## 2023-09-22 DIAGNOSIS — Z8673 Personal history of transient ischemic attack (TIA), and cerebral infarction without residual deficits: Secondary | ICD-10-CM | POA: Insufficient documentation

## 2023-09-22 DIAGNOSIS — C3411 Malignant neoplasm of upper lobe, right bronchus or lung: Secondary | ICD-10-CM | POA: Diagnosis not present

## 2023-09-22 DIAGNOSIS — Z79899 Other long term (current) drug therapy: Secondary | ICD-10-CM | POA: Diagnosis not present

## 2023-09-22 DIAGNOSIS — M129 Arthropathy, unspecified: Secondary | ICD-10-CM | POA: Insufficient documentation

## 2023-09-22 DIAGNOSIS — I10 Essential (primary) hypertension: Secondary | ICD-10-CM | POA: Insufficient documentation

## 2023-09-22 NOTE — Progress Notes (Signed)
Radiation Oncology         [phone] ________________________________  Name: Ellen Henry        MRN: [id number]  Date of Service: 09/22/2023 DOB: 1946-04-16  MW:UXLKG, Adrian Saran, MD  Leslye Peer, MD     REFERRING PHYSICIAN: Leslye Peer, MD   DIAGNOSIS: The encounter diagnosis was Malignant neoplasm of right upper lobe of lung (HCC).   HISTORY OF PRESENT ILLNESS: Ellen Henry is a 77 y.o. female seen at the request of Dr. Delton Coombes for a newly diagnosed lung cancer.  The patient noted right shoulder pain in 2023 and a CT scan of the shoulder identified a 1.2 cm subsolid nodule in the right upper lobe, this was followed with a repeat CT on 06/10/2022 that showed stability in this area measuring 1.6 cm.  She was ultimately referred for lung cancer screening clinic and a CT chest on 624 showed enlargement of the nodule measuring 1.72 cm with a solid component measuring 9.1 mm.  She underwent a super D CT scan on 08/04/2023 which measured the lesion is 18 mm with a 9 mm solid component and bronchoscopy on 08/23/2023 showed adenocarcinoma in the right upper lobe brushings and needle aspirate biopsies.  She also underwent a PET scan on 09/16/2023 which has not been read, and she has decided that she is not interested in surgical intervention for treatment so rather, she is seen to discuss stereotactic body radiation (SBRT).    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 Echo 12/17/16   History of sepsis    after  spinal surgery   Hypertension    Impingement syndrome of right shoulder    Lung cancer (HCC) 08/23/2023   Neuropathy    Osteoarthritis of AC (acromioclavicular) joint    Right   Pneumonia    Radiculopathy    Rotator cuff tear, right    Sleep apnea    does not use cpap   Stroke (HCC) 12/15/2016   left side weakness       PAST SURGICAL HISTORY: Past Surgical History:  Procedure Laterality Date   ABDOMINAL EXPOSURE N/A 08/29/2014   Procedure: ABDOMINAL EXPOSURE;  Surgeon: Larina Earthly, MD;  Location: [facility] OR;  Service: Vascular;  Laterality: N/A;   ABDOMINAL HYSTERECTOMY     ANTERIOR LUMBAR FUSION N/A 08/29/2014   Procedure: ANTERIOR LUMBAR FUSION 1 LEVEL;  Surgeon: Emilee Hero, MD;  Location: [facility] OR;  Service: Orthopedics;  Laterality: N/A;  Lumbar 4-5 anterior lumbar interbody fusion with allograft and instrumentation.   APPENDECTOMY     BACK SURGERY  08/28/2014 and 08/29/2014   lumbar fusion   BRONCHIAL BIOPSY  08/23/2023   Procedure: BRONCHIAL BIOPSIES;  Surgeon: Leslye Peer, MD;  Location: [facility] ENDOSCOPY;  Service: Pulmonary;;   BRONCHIAL BRUSHINGS  08/23/2023   Procedure: BRONCHIAL BRUSHINGS;  Surgeon: Leslye Peer, MD;  Location[facility] ENDOSCOPY;  Service: Pulmonary;;   BRONCHIAL NEEDLE ASPIRATION BIOPSY  08/23/2023   Procedure: BRONCHIAL NEEDLE ASPIRATION BIOPSIES;  Surgeon: Leslye Peer, MD;  Location: [facility] ENDOSCOPY;  Service: Pulmonary;;   CARPAL TUNNEL RELEASE Right    release done twice   CHOLECYSTECTOMY N/A 08/12/2021   Procedure: LAPAROSCOPIC CHOLECYSTECTOMY;  Surgeon: Diamantina Monks, MD;  Location: [facility] OR;  Service: General;  Laterality: N/A;   COLONOSCOPY     FIDUCIAL MARKER PLACEMENT  08/23/2023   Procedure: FIDUCIAL MARKER PLACEMENT;  Surgeon: Leslye Peer, MD;  Location: [facility] ENDOSCOPY;  Service: Pulmonary;;   lower intestine removal     REPLACEMENT TOTAL KNEE  2010   rigth knee   REVERSE SHOULDER ARTHROPLASTY Right 08/25/2022   Procedure: RIGHT  REVERSE SHOULDER ARTHROPLASTY;  Surgeon: Cammy Copa, MD;  Location: [facility] OR;  Service: Orthopedics;  Laterality: Right;   SHOULDER ARTHROSCOPY Right 01/18/2018   Procedure: RIGHT SHOULDER ARTHROSCOPY, SUBACROMIAL DECOMPRESSION, DISTAL CLAVICLE RESECTION WITH MINI OPEN ROTATOR CUFF REPAIR;  Surgeon: Valeria Batman, MD;  Location: [facility] OR;  Service: Orthopedics;  Laterality: Right;   TOE SURGERY Right    bone spur R. Great toe   TONSILLECTOMY     TOTAL HIP ARTHROPLASTY Right 06/11/2021   Procedure: TOTAL HIP ARTHROPLASTY ANTERIOR APPROACH;  Surgeon: Jodi Geralds, MD;  Location: [facility] ORS;  Service: Orthopedics;  Laterality: Right;   TOTAL KNEE ARTHROPLASTY Left 02/15/2015   Procedure: TOTAL KNEE ARTHROPLASTY;  Surgeon: Jodi Geralds, MD;  Location: [facility] OR;  Service: Orthopedics;  Laterality: Left;   VIDEO BRONCHOSCOPY WITH RADIAL ENDOBRONCHIAL ULTRASOUND  08/23/2023   Procedure: VIDEO BRONCHOSCOPY WITH RADIAL ENDOBRONCHIAL ULTRASOUND;  Surgeon: Leslye Peer, MD;  Location: [facility] ENDOSCOPY;  Service: Pulmonary;;     FAMILY HISTORY:  Family History  Problem Relation Age of Onset   Varicose Veins Mother      SOCIAL HISTORY:  reports that she has been smoking cigarettes. She has been exposed to tobacco smoke. She has never used smokeless tobacco. She reports that she does not drink alcohol and does not use drugs.   ALLERGIES: Food allergy formula   MEDICATIONS:  Current Outpatient Medications  Medication Sig Dispense Refill   amLODipine (NORVASC) 5 MG tablet Take 5 mg by mouth daily.     aspirin EC 81 MG tablet Take 1 tablet (81 mg total) by mouth at bedtime. Swallow whole. 30 tablet 12   gabapentin (NEURONTIN) 100 MG capsule Take 1 capsule (100 mg total) by mouth 3 (three) times daily. 90 capsule 0   ibuprofen (ADVIL) 400 MG tablet Take 1 tablet (400 mg total) by mouth every 8 (eight) hours as needed for mild pain or moderate pain (alternate with tylenol and oxycodone). 30 tablet 0    indapamide (LOZOL) 1.25 MG tablet Take 1.25 mg by mouth every morning.     rosuvastatin (CRESTOR) 20 MG tablet Take 20 mg by mouth at bedtime.     telmisartan (MICARDIS) 40 MG tablet Take 40 mg by mouth every morning.     OZEMPIC, 2 MG/DOSE, 8 MG/3ML SOPN  (Patient not taking: Reported on 09/22/2023)     varenicline (CHANTIX) 1 MG tablet Take 1 mg by mouth 2 (two) times daily. (Patient not taking: Reported on 09/22/2023)     No current facility-administered medications for this encounter.     REVIEW OF SYSTEMS: On review of systems, the patient reports that she is doing well overall.  She states that she has left hip pain and is going to be discussing options of intervention with her orthopedist.  She also has a boil on her back that she is interested in having resected and is contemplating doing this with one of her providers.  She also has back pain and is trying to manage this with her PCP team.  Her blood pressure was elevated today but she states that she has been nervous and denies any new onset of chest pain or shortness of breath.  She does have a chronic cough that is sometimes productive but she denies any hemoptysis.  She reports she has not had any unintended weight changes, and states that when she does walk longer distances she does have some shortness of breath but this is her baseline.  No other complaints are verbalized.    PHYSICAL EXAM:  Wt Readings from Last 3 Encounters:  09/22/23 222 lb 3.2 oz (100.8 kg)  08/23/23 207 lb (93.9 kg)  07/27/23 207 lb 3.2 oz (94 kg)   Temp Readings from Last 3 Encounters:  09/22/23 97.7 F (36.5 C)  08/23/23 97.6 F (36.4 C)  08/26/22 97.6 F (36.4 C) (Oral)   BP Readings from Last 3 Encounters:  09/22/23 (!) 155/89  08/23/23 110/64  07/27/23 126/80   Pulse Readings from Last 3 Encounters:  09/22/23 66  08/23/23 61  07/27/23 78   Pain Assessment Pain Score: 5  Pain Loc: Back/10  In general this is a well appearing  African-American female in no acute distress. She's alert and oriented x4 and appropriate throughout the examination. Cardiopulmonary assessment is negative for acute distress and she exhibits normal effort.     ECOG = 1  0 - Asymptomatic (Fully active, able to carry on all predisease activities without restriction)  1 - Symptomatic but completely ambulatory (Restricted in physically strenuous activity but ambulatory and able to carry out work of a light or sedentary nature. For example, light housework, office work)  2 - Symptomatic, <50% in bed during the day (Ambulatory and capable of all self care but unable to carry out any work activities. Up and about more than 50% of waking hours)  3 - Symptomatic, >50% in bed, but not bedbound (Capable of only limited self-care, confined to bed or chair 50% or more of waking hours)  4 - Bedbound (Completely disabled. Cannot carry on any self-care. Totally confined to bed or chair)  5 - Death   Santiago Glad MM, Creech RH, Tormey DC, et al. [phone]). "Toxicity and response criteria of the [facility]". Am. Evlyn Clines. Oncol. 5 (6): 649-55    LABORATORY DATA:  Lab Results  Component Value Date   WBC 10.3 08/26/2022   HGB 9.3 (L) 08/26/2022   HCT 27.5 (L) 08/26/2022   MCV 91.4 08/26/2022   PLT 172 08/26/2022   Lab Results  Component Value Date   NA 141 08/19/2022   K 3.7 08/19/2022   CL 111 08/19/2022   CO2 21 (L) 08/19/2022   Lab Results  Component Value Date   ALT 36 08/15/2021   AST 53 (H) 08/15/2021   ALKPHOS 118 08/15/2021   BILITOT 0.6 08/15/2021      RADIOGRAPHY: No results found.     IMPRESSION/PLAN: 1. Stage IA2, cT1bN0M0, NSCLC, adenocarcinoma of the RUL. Dr. Mitzi Hansen discusses the pathology findings and reviews the nature of early stage lung cancer. Dr. Mitzi Hansen reviews that the standard of care is for surgical resection. However for patients who are not medical candidates to undergo surgery, or who choose to  forgo surgery, stereotactic body radiotherapy (SBRT) is an  appropriate alternative. The patient was offered surgical referral but declines today. Rather, she's interested in proceeding with SBRT. We discussed the risks, benefits, short, and long term effects of radiotherapy, as well as the curative intent. Dr. Mitzi Hansen discusses the delivery and logistics of radiotherapy and anticipates a course of 3-5 fractions of radiotherapy. Written consent is obtained and placed in the chart, a copy was provided to the patient. The patient will be contacted to coordinate treatment planning by our simulation department.    In a visit lasting 60 minutes, greater than 50% of the time was spent face to face discussing the patient's condition, in preparation for the discussion, and coordinating the patient's care.   The above documentation reflects my direct findings during this shared patient visit. Please see the separate note by Dr. Mitzi Hansen on this date for the remainder of the patient's plan of care.    Osker Mason, [facility]   **Disclaimer: This note was dictated with voice recognition software. Similar sounding words can inadvertently be transcribed and this note may contain transcription errors which may not have been corrected upon publication of note.**

## 2023-09-23 DIAGNOSIS — C3411 Malignant neoplasm of upper lobe, right bronchus or lung: Secondary | ICD-10-CM | POA: Diagnosis not present

## 2023-09-23 DIAGNOSIS — F17210 Nicotine dependence, cigarettes, uncomplicated: Secondary | ICD-10-CM | POA: Diagnosis not present

## 2023-09-29 ENCOUNTER — Other Ambulatory Visit: Payer: Self-pay

## 2023-09-29 ENCOUNTER — Ambulatory Visit
Admission: RE | Admit: 2023-09-29 | Discharge: 2023-09-29 | Disposition: A | Discharge status: Home / Self Care | Payer: Medicare Other | Source: Ambulatory Visit | Attending: Radiation Oncology | Admitting: Radiation Oncology

## 2023-09-29 DIAGNOSIS — F17210 Nicotine dependence, cigarettes, uncomplicated: Secondary | ICD-10-CM | POA: Insufficient documentation

## 2023-09-29 DIAGNOSIS — C3411 Malignant neoplasm of upper lobe, right bronchus or lung: Secondary | ICD-10-CM | POA: Diagnosis not present

## 2023-10-01 ENCOUNTER — Ambulatory Visit
Admission: RE | Admit: 2023-10-01 | Discharge: 2023-10-01 | Disposition: A | Discharge status: Home / Self Care | Payer: Medicare Other | Source: Ambulatory Visit | Attending: Family Medicine | Admitting: Family Medicine

## 2023-10-01 ENCOUNTER — Other Ambulatory Visit: Payer: Self-pay | Admitting: Family Medicine

## 2023-10-01 DIAGNOSIS — M25552 Pain in left hip: Secondary | ICD-10-CM

## 2023-10-01 DIAGNOSIS — M1250 Traumatic arthropathy, unspecified site: Secondary | ICD-10-CM | POA: Diagnosis not present

## 2023-10-01 DIAGNOSIS — M1612 Unilateral primary osteoarthritis, left hip: Secondary | ICD-10-CM | POA: Diagnosis not present

## 2023-10-01 DIAGNOSIS — I10 Essential (primary) hypertension: Secondary | ICD-10-CM | POA: Diagnosis not present

## 2023-10-05 DIAGNOSIS — C3411 Malignant neoplasm of upper lobe, right bronchus or lung: Secondary | ICD-10-CM | POA: Diagnosis not present

## 2023-10-05 DIAGNOSIS — F17210 Nicotine dependence, cigarettes, uncomplicated: Secondary | ICD-10-CM | POA: Diagnosis not present

## 2023-10-12 ENCOUNTER — Ambulatory Visit: Admission: RE | Admit: 2023-10-12 | Payer: Medicare Other | Source: Ambulatory Visit | Admitting: Radiation Oncology

## 2023-10-13 ENCOUNTER — Ambulatory Visit: Payer: Medicare Other | Admitting: Radiation Oncology

## 2023-10-13 ENCOUNTER — Ambulatory Visit
Admission: RE | Admit: 2023-10-13 | Discharge: 2023-10-13 | Disposition: A | Discharge status: Home / Self Care | Payer: Medicare Other | Source: Ambulatory Visit | Attending: Radiation Oncology | Admitting: Radiation Oncology

## 2023-10-13 ENCOUNTER — Other Ambulatory Visit: Payer: Self-pay

## 2023-10-13 DIAGNOSIS — C3411 Malignant neoplasm of upper lobe, right bronchus or lung: Secondary | ICD-10-CM | POA: Insufficient documentation

## 2023-10-13 LAB — RAD ONC ARIA SESSION SUMMARY
Course Elapsed Days: 0
Plan Fractions Treated to Date: 1
Plan Prescribed Dose Per Fraction: 18 Gy
Plan Total Fractions Prescribed: 3
Plan Total Prescribed Dose: 54 Gy
Reference Point Dosage Given to Date: 18 Gy
Reference Point Session Dosage Given: 18 Gy
Session Number: 1

## 2023-10-14 ENCOUNTER — Ambulatory Visit: Payer: Medicare Other | Admitting: Radiation Oncology

## 2023-10-15 ENCOUNTER — Ambulatory Visit
Admission: RE | Admit: 2023-10-15 | Discharge: 2023-10-15 | Disposition: A | Discharge status: Home / Self Care | Payer: Medicare Other | Source: Ambulatory Visit | Attending: Radiation Oncology | Admitting: Radiation Oncology

## 2023-10-15 ENCOUNTER — Other Ambulatory Visit: Payer: Self-pay

## 2023-10-15 ENCOUNTER — Ambulatory Visit: Payer: Medicare Other | Admitting: Radiation Oncology

## 2023-10-15 DIAGNOSIS — E1169 Type 2 diabetes mellitus with other specified complication: Secondary | ICD-10-CM | POA: Diagnosis not present

## 2023-10-15 DIAGNOSIS — C3411 Malignant neoplasm of upper lobe, right bronchus or lung: Secondary | ICD-10-CM | POA: Diagnosis not present

## 2023-10-15 DIAGNOSIS — I10 Essential (primary) hypertension: Secondary | ICD-10-CM | POA: Diagnosis not present

## 2023-10-15 DIAGNOSIS — C3490 Malignant neoplasm of unspecified part of unspecified bronchus or lung: Secondary | ICD-10-CM | POA: Diagnosis not present

## 2023-10-15 LAB — RAD ONC ARIA SESSION SUMMARY
Course Elapsed Days: 2
Plan Fractions Treated to Date: 2
Plan Prescribed Dose Per Fraction: 18 Gy
Plan Total Fractions Prescribed: 3
Plan Total Prescribed Dose: 54 Gy
Reference Point Dosage Given to Date: 36 Gy
Reference Point Session Dosage Given: 18 Gy
Session Number: 2

## 2023-10-18 ENCOUNTER — Encounter: Payer: Self-pay | Admitting: Adult Health

## 2023-10-18 ENCOUNTER — Other Ambulatory Visit: Payer: Self-pay

## 2023-10-18 ENCOUNTER — Ambulatory Visit
Admission: RE | Admit: 2023-10-18 | Discharge: 2023-10-18 | Disposition: A | Discharge status: Home / Self Care | Payer: Medicare Other | Source: Ambulatory Visit | Attending: Radiation Oncology | Admitting: Radiation Oncology

## 2023-10-18 ENCOUNTER — Ambulatory Visit (INDEPENDENT_AMBULATORY_CARE_PROVIDER_SITE_OTHER): Payer: Medicare Other | Admitting: Adult Health

## 2023-10-18 VITALS — BP 126/70 | HR 69 | Temp 98.0°F | Ht 59.0 in | Wt 209.6 lb

## 2023-10-18 DIAGNOSIS — R911 Solitary pulmonary nodule: Secondary | ICD-10-CM | POA: Diagnosis not present

## 2023-10-18 DIAGNOSIS — Z23 Encounter for immunization: Secondary | ICD-10-CM | POA: Diagnosis not present

## 2023-10-18 DIAGNOSIS — F17210 Nicotine dependence, cigarettes, uncomplicated: Secondary | ICD-10-CM | POA: Diagnosis not present

## 2023-10-18 DIAGNOSIS — C3411 Malignant neoplasm of upper lobe, right bronchus or lung: Secondary | ICD-10-CM

## 2023-10-18 DIAGNOSIS — Z510 Encounter for antineoplastic radiation therapy: Secondary | ICD-10-CM | POA: Diagnosis not present

## 2023-10-18 LAB — RAD ONC ARIA SESSION SUMMARY
Course Elapsed Days: 5
Plan Fractions Treated to Date: 3
Plan Prescribed Dose Per Fraction: 18 Gy
Plan Total Fractions Prescribed: 3
Plan Total Prescribed Dose: 54 Gy
Reference Point Dosage Given to Date: 54 Gy
Reference Point Session Dosage Given: 18 Gy
Session Number: 3

## 2023-10-18 NOTE — Progress Notes (Signed)
@Patient  ID: Ellen Henry, female    DOB: 1946/12/06, 77 y.o.   MRN: [id number]  Chief Complaint  Patient presents with   Follow-up  Discussed the use of AI scribe software for clinical note transcription with the patient, who gave verbal consent to proceed.  Referring provider: Renaye Rakers, MD  HPI: 77 year old female former smoker quit 09/2023 seen for pulmonary consult July 27, 2023 for abnormal CT chest with right upper lobe pulmonary nodule  TEST/EVENTS :  Low-dose CT chest May 31, 2023 posterior right upper lobe mixed nodule measuring 17.2 mm with solid central portion.  This is slightly enlarged compared to July 2023.  No thoracic adenopathy.  CT chest August 04, 2023 showed mildly progressive subsolid right upper lobe pulmonary nodule.  Bronchoscopy August 04, 2023 cytology positive for adenocarcinoma  PET scan September 16, 2023 showed 16 mm right upper lobe nodule with SUV max at 1.8, no hypermetabolic thoracic lymphadenopathy  10/18/2023 Follow up : Lung cancer  Patient presents for 24-month follow-up.  Patient was seen in August for pulmonary consult for abnormal CT chest with right upper lobe pulmonary nodule.  Patient underwent bronchoscopy August 04, 2023 with cytology positive for adenocarcinoma.  PET scan September 16, 2023 showed 16 mm right upper lobe nodule with SUV max at 1.8.  No hypermetabolic thoracic lymphadenopathy.  Stage IA2, cT1bN0M0, NSCLC, adenocarcinoma of the RUL . Patient was not interested in surgical intervention.  She was referred to radiation oncology and is undergoing SBRT. She completes her last session today.  Says she has tolerated well.  Denies any hemoptysis or weight loss.  She has quit smoking since last visit.   Discussed the use of AI scribe software for clinical note transcription with the patient, who gave verbal consent to proceed.       Allergies  Allergen Reactions   Food Allergy Formula Other (See Comments)     Shellfish-banana-beef-flares up her gout    Immunization History  Administered Date(s) Administered   Fluad Trivalent(High Dose 65+) 10/18/2023   Influenza, High Dose Seasonal PF 01/19/2018   Influenza,inj,Quad PF,6+ Mos 09/01/2014, 12/17/2016   PFIZER(Purple Top)SARS-COV-2 Vaccination 03/02/2020, 03/26/2020   Pneumococcal Polysaccharide-23 09/01/2014    Past Medical History:  Diagnosis Date   Acute kidney failure due to procedure    Arthritis    Complication of anesthesia 12/09/2017   dyspnea post right interscalene nerve block (at [facility]) with likely phrenic nerve involement by Alicia Amel transported to [facility])   Dry skin    GERD (gastroesophageal reflux disease)    "years ago", diet controlled   Gout    H/O pyelonephritis    as a child   Heart murmur    since birth, denies any problems. Echo 12/17/16   History of sepsis    after spinal surgery   Hypertension    Impingement syndrome of right shoulder    Lung cancer (HCC) 08/23/2023   Neuropathy    Osteoarthritis of AC (acromioclavicular) joint    Right   Pneumonia    Radiculopathy    Rotator cuff tear, right    Sleep apnea    does not use cpap   Stroke (HCC) 12/15/2016   left side weakness    Tobacco History: Social History   Tobacco Use  Smoking Status Every Day   Current packs/day: 0.00   Types: Cigarettes   Last attempt to quit: 07/07/2022   Years since quitting: 1.2   Passive exposure: Current (1/2 pack)  Smokeless Tobacco  Never   Ready to quit: Not Answered Counseling given: Not Answered   Outpatient Medications Prior to Visit  Medication Sig Dispense Refill   amLODipine (NORVASC) 5 MG tablet Take 5 mg by mouth daily.     aspirin EC 81 MG tablet Take 1 tablet (81 mg total) by mouth at bedtime. Swallow whole. 30 tablet 12   gabapentin (NEURONTIN) 100 MG capsule Take 1 capsule (100 mg total) by mouth 3 (three) times daily. 90 capsule 0   ibuprofen (ADVIL) 400 MG tablet Take 1 tablet  (400 mg total) by mouth every 8 (eight) hours as needed for mild pain or moderate pain (alternate with tylenol and oxycodone). 30 tablet 0   indapamide (LOZOL) 1.25 MG tablet Take 1.25 mg by mouth every morning.     rosuvastatin (CRESTOR) 20 MG tablet Take 20 mg by mouth at bedtime.     telmisartan (MICARDIS) 40 MG tablet Take 40 mg by mouth every morning.     OZEMPIC, 2 MG/DOSE, 8 MG/3ML SOPN  (Patient not taking: Reported on 09/22/2023)     varenicline (CHANTIX) 1 MG tablet Take 1 mg by mouth 2 (two) times daily. (Patient not taking: Reported on 09/22/2023)     No facility-administered medications prior to visit.     Review of Systems:   Constitutional:   No  weight loss, night sweats,  Fevers, chills, fatigue, or  lassitude.  HEENT:   No headaches,  Difficulty swallowing,  Tooth/dental problems, or  Sore throat,                No sneezing, itching, ear ache, nasal congestion, post nasal drip,   CV:  No chest pain,  Orthopnea, PND, swelling in lower extremities, anasarca, dizziness, palpitations, syncope.   GI  No heartburn, indigestion, abdominal pain, nausea, vomiting, diarrhea, change in bowel habits, loss of appetite, bloody stools.   Resp: No shortness of breath with exertion or at rest.  No excess mucus, no productive cough,  No non-productive cough,  No coughing up of blood.  No change in color of mucus.  No wheezing.  No chest wall deformity  Skin: no rash or lesions.  GU: no dysuria, change in color of urine, no urgency or frequency.  No flank pain, no hematuria   MS:  No joint pain or swelling.  No decreased range of motion.  No back pain.    Physical Exam  BP 126/70 (BP Location: Right Arm, Patient Position: Sitting, Cuff Size: Normal)   Pulse 69   Temp 98 F (36.7 C) (Oral)   Ht 4\' 11"  (1.499 m)   Wt 209 lb 9.6 oz (95.1 kg)   SpO2 99%   BMI 42.33 kg/m   GEN: A/Ox3; pleasant , NAD, well nourished , walks with cane    HEENT:  [place]/AT,  EACs-clear, TMs-wnl,  NOSE-clear, THROAT-clear, no lesions, no postnasal drip or exudate noted.   NECK:  Supple w/ fair ROM; no JVD; normal carotid impulses w/o bruits; no thyromegaly or nodules palpated; no lymphadenopathy.    RESP  Clear  P & A; w/o, wheezes/ rales/ or rhonchi. no accessory muscle use, no dullness to percussion  CARD:  RRR, no m/r/g, no peripheral edema, pulses intact, no cyanosis or clubbing.  GI:   Soft & nt; nml bowel sounds; no organomegaly or masses detected.   Musco: Warm bil, no deformities or joint swelling noted.   Neuro: alert, no focal deficits noted.    Skin: Warm, no lesions or rashes  Lab Results:  CBC  BMET  Imaging: No results found.  Administration History     None           No data to display          No results found for: "NITRICOXIDE"      Assessment & Plan:  Assessment and Plan    Non-small cell lung cancer-adenocarcinoma Stage IA2, cT1bN0M0, NSCLC  She has early stage lung cancer in the right upper lobe, measuring 17 mm,  PET scan with no evidence of metastatic disease.  She is undergoing radiation therapy Recommend continue follow-up with radiation oncology along with serial CT scans   Smoking Cessation   She successfully quit smoking last month. Took Chantix briefly. Reports no known lung problems or inhaler use.  We will perform a baseline lung function test in three months and monitor for any respiratory issues.  General Health Maintenance   She requested a flu shot and emphasized the importance of good hand hygiene and avoiding illness to allow the body to recover from radiation. We will administer the flu shot today and encourage good hand hygiene and rest.  Follow-up   We will schedule a follow-up appointment in three months with PFT.  She is to keep follow up with Radiation Oncology with serial CT scans.         Rubye Oaks, NP 10/18/2023

## 2023-10-18 NOTE — Patient Instructions (Addendum)
Great job on quitting smoking. Continue follow-up with radiation oncology. CT chest per radiation oncology Flu shot. Follow-up with Dr. Delton Coombes in 3 months with pulmonary function testing

## 2023-10-19 NOTE — Radiation Completion Notes (Signed)
  Radiation Oncology         [phone]) [phone] ________________________________  Name: Ellen Henry MRN: [id number]  Date of Service: 10/18/2023  DOB: [birth year]  End of Treatment Note  Diagnosis: Stage IA2, cT1bN0M0, NSCLC, adenocarcinoma of the RUL   Intent: Curative     ==========DELIVERED PLANS==========  First Treatment Date: 2023-10-13 - Last Treatment Date: 2023-10-18   Plan Name: Lung_R_SBRT Site: Lung, RUL Technique: SBRT/SRT-IMRT Mode: Photon Dose Per Fraction: 18 Gy Prescribed Dose (Delivered / Prescribed): 54 Gy / 54 Gy Prescribed Fxs (Delivered / Prescribed): 3 / 3     ==========ON TREATMENT VISIT DATES========== 2023-10-13, 2023-10-15, 2023-10-15, 2023-10-18   The patient tolerated radiation. She will be scheduled for a repeat CT chest in 6-8 weeks, and will receive a call in about one month from the radiation oncology department.       Osker Mason, PAC

## 2023-10-20 ENCOUNTER — Ambulatory Visit: Payer: Medicare Other | Admitting: Radiation Oncology

## 2023-10-21 ENCOUNTER — Other Ambulatory Visit: Payer: Self-pay | Admitting: Radiation Oncology

## 2023-10-21 DIAGNOSIS — C3411 Malignant neoplasm of upper lobe, right bronchus or lung: Secondary | ICD-10-CM

## 2023-10-22 ENCOUNTER — Ambulatory Visit: Payer: Medicare Other | Admitting: Radiation Oncology

## 2023-11-12 NOTE — Progress Notes (Addendum)
  Radiation Oncology         [phone]) [phone] ________________________________  Name: Ellen Henry MRN: [id number]  Date of Service: 11/15/2023  DOB: Nov 26, 1946  Post Treatment Telephone Note  Diagnosis:  Stage IA2, cT1bN0M0, NSCLC, adenocarcinoma of the RUL (as documented in provider EOT note)  The patient was available for call today.   Patient reports mild SOB and a dry cough, states "Due to the cold weather."  Symptoms of fatigue have improved since completing therapy.  Symptoms of skin changes have improved since completing therapy.  Symptoms of esophagitis have improved since completing therapy.  The patient has scheduled follow up with for a Ct-Chest scan within 8 wks and was encouraged to call if she develops concerns or questions regarding radiation.   This concludes the interaction.  Ruel Favors, LPN

## 2023-11-15 ENCOUNTER — Ambulatory Visit
Admission: RE | Admit: 2023-11-15 | Discharge: 2023-11-15 | Disposition: A | Discharge status: Home / Self Care | Payer: Medicare Other | Source: Ambulatory Visit | Attending: Radiation Oncology | Admitting: Radiation Oncology

## 2023-11-15 DIAGNOSIS — C3411 Malignant neoplasm of upper lobe, right bronchus or lung: Secondary | ICD-10-CM | POA: Insufficient documentation

## 2023-11-19 DIAGNOSIS — R600 Localized edema: Secondary | ICD-10-CM | POA: Diagnosis not present

## 2023-11-19 DIAGNOSIS — C3490 Malignant neoplasm of unspecified part of unspecified bronchus or lung: Secondary | ICD-10-CM | POA: Diagnosis not present

## 2023-11-19 DIAGNOSIS — E785 Hyperlipidemia, unspecified: Secondary | ICD-10-CM | POA: Diagnosis not present

## 2023-11-19 DIAGNOSIS — E88819 Insulin resistance, unspecified: Secondary | ICD-10-CM | POA: Diagnosis not present

## 2023-11-19 DIAGNOSIS — I10 Essential (primary) hypertension: Secondary | ICD-10-CM | POA: Diagnosis not present

## 2023-11-19 DIAGNOSIS — R7303 Prediabetes: Secondary | ICD-10-CM | POA: Diagnosis not present

## 2023-12-09 ENCOUNTER — Telehealth: Payer: Self-pay | Admitting: Radiation Oncology

## 2023-12-09 ENCOUNTER — Telehealth: Payer: Self-pay | Admitting: *Deleted

## 2023-12-09 NOTE — Telephone Encounter (Signed)
 Pt returned missed call from [place]. Pt was advised of details in Shirley's note. Pt verbalized understanding of upcoming CT appt and telephone f/u call and denied having any questions.

## 2023-12-09 NOTE — Telephone Encounter (Signed)
 CALLED PATIENT TO INFORM OF CT FOR 12-17-23- ARRIVAL TIME- 2:45 PM @ [facility], NO RESTRICTIONS TO SCAN, PATIENT TO RECEIVE RESULTS FROM ALISON PERKINS ON 01-10-24 @ 2 PM VIA TELEPHONE, LVM FOR A RETURN CALL

## 2023-12-10 DIAGNOSIS — R001 Bradycardia, unspecified: Secondary | ICD-10-CM | POA: Diagnosis not present

## 2023-12-10 DIAGNOSIS — I509 Heart failure, unspecified: Secondary | ICD-10-CM | POA: Diagnosis not present

## 2023-12-10 DIAGNOSIS — R0602 Shortness of breath: Secondary | ICD-10-CM | POA: Diagnosis not present

## 2023-12-10 DIAGNOSIS — E785 Hyperlipidemia, unspecified: Secondary | ICD-10-CM | POA: Diagnosis not present

## 2023-12-10 DIAGNOSIS — I10 Essential (primary) hypertension: Secondary | ICD-10-CM | POA: Diagnosis not present

## 2023-12-13 ENCOUNTER — Ambulatory Visit: Payer: Self-pay | Admitting: Radiation Oncology

## 2023-12-16 DIAGNOSIS — I110 Hypertensive heart disease with heart failure: Secondary | ICD-10-CM | POA: Diagnosis not present

## 2023-12-16 DIAGNOSIS — E88819 Insulin resistance, unspecified: Secondary | ICD-10-CM | POA: Diagnosis not present

## 2023-12-16 DIAGNOSIS — I5020 Unspecified systolic (congestive) heart failure: Secondary | ICD-10-CM | POA: Diagnosis not present

## 2023-12-16 DIAGNOSIS — C3490 Malignant neoplasm of unspecified part of unspecified bronchus or lung: Secondary | ICD-10-CM | POA: Diagnosis not present

## 2023-12-17 ENCOUNTER — Ambulatory Visit (HOSPITAL_COMMUNITY)
Admission: RE | Admit: 2023-12-17 | Discharge: 2023-12-17 | Disposition: A | Discharge status: Home / Self Care | Payer: Medicare Other | Source: Ambulatory Visit | Attending: Radiation Oncology | Admitting: Radiation Oncology

## 2023-12-17 DIAGNOSIS — C3411 Malignant neoplasm of upper lobe, right bronchus or lung: Secondary | ICD-10-CM | POA: Insufficient documentation

## 2023-12-17 DIAGNOSIS — I700 Atherosclerosis of aorta: Secondary | ICD-10-CM | POA: Diagnosis not present

## 2023-12-17 DIAGNOSIS — J439 Emphysema, unspecified: Secondary | ICD-10-CM | POA: Diagnosis not present

## 2023-12-17 DIAGNOSIS — J9811 Atelectasis: Secondary | ICD-10-CM | POA: Diagnosis not present

## 2023-12-17 DIAGNOSIS — C3490 Malignant neoplasm of unspecified part of unspecified bronchus or lung: Secondary | ICD-10-CM | POA: Diagnosis not present

## 2023-12-17 MED ORDER — IOHEXOL 300 MG/ML  SOLN
60.0000 mL | Freq: Once | INTRAMUSCULAR | Status: AC | PRN
  Administered 2023-12-17: 60 mL via INTRAVENOUS

## 2023-12-20 LAB — POCT I-STAT CREATININE: Creatinine, Ser: 1.3 mg/dL — ABNORMAL HIGH (ref 0.44–1.00)

## 2023-12-30 ENCOUNTER — Telehealth: Payer: Self-pay | Admitting: Radiation Oncology

## 2023-12-30 DIAGNOSIS — C3411 Malignant neoplasm of upper lobe, right bronchus or lung: Secondary | ICD-10-CM

## 2023-12-30 NOTE — Telephone Encounter (Signed)
I called the patient to let her know her CT scan results and the rationale to repeat at 3 month interval to follow up on what appears to be post treatment related changes. I had to leave a VM for the patient to discuss this.

## 2024-01-06 DIAGNOSIS — K9189 Other postprocedural complications and disorders of digestive system: Secondary | ICD-10-CM | POA: Diagnosis not present

## 2024-01-06 DIAGNOSIS — I10 Essential (primary) hypertension: Secondary | ICD-10-CM | POA: Diagnosis not present

## 2024-01-06 DIAGNOSIS — E1169 Type 2 diabetes mellitus with other specified complication: Secondary | ICD-10-CM | POA: Diagnosis not present

## 2024-01-06 DIAGNOSIS — C3490 Malignant neoplasm of unspecified part of unspecified bronchus or lung: Secondary | ICD-10-CM | POA: Diagnosis not present

## 2024-01-06 DIAGNOSIS — R600 Localized edema: Secondary | ICD-10-CM | POA: Diagnosis not present

## 2024-01-10 ENCOUNTER — Ambulatory Visit: Payer: Medicare Other | Admitting: Radiation Oncology

## 2024-01-19 ENCOUNTER — Other Ambulatory Visit: Payer: Self-pay | Admitting: *Deleted

## 2024-01-19 DIAGNOSIS — R911 Solitary pulmonary nodule: Secondary | ICD-10-CM

## 2024-01-19 DIAGNOSIS — C3490 Malignant neoplasm of unspecified part of unspecified bronchus or lung: Secondary | ICD-10-CM

## 2024-01-20 ENCOUNTER — Encounter: Payer: Self-pay | Admitting: Emergency Medicine

## 2024-01-20 ENCOUNTER — Ambulatory Visit: Payer: Medicare Other | Admitting: Emergency Medicine

## 2024-01-20 VITALS — BP 116/78 | HR 87 | Ht 59.0 in | Wt 221.0 lb

## 2024-01-20 DIAGNOSIS — C3491 Malignant neoplasm of unspecified part of right bronchus or lung: Secondary | ICD-10-CM | POA: Diagnosis not present

## 2024-01-20 DIAGNOSIS — J449 Chronic obstructive pulmonary disease, unspecified: Secondary | ICD-10-CM | POA: Diagnosis not present

## 2024-01-20 DIAGNOSIS — C3490 Malignant neoplasm of unspecified part of unspecified bronchus or lung: Secondary | ICD-10-CM | POA: Diagnosis not present

## 2024-01-20 DIAGNOSIS — R911 Solitary pulmonary nodule: Secondary | ICD-10-CM

## 2024-01-20 LAB — PULMONARY FUNCTION TEST
DL/VA % pred: 63 %
DL/VA: 2.69 ml/min/mmHg/L
DLCO cor % pred: 53 %
DLCO cor: 8.4 ml/min/mmHg
DLCO unc % pred: 53 %
DLCO unc: 8.4 ml/min/mmHg
FEF 25-75 Post: 1.07 L/s
FEF 25-75 Pre: 0.76 L/s
FEF2575-%Change-Post: 40 %
FEF2575-%Pred-Post: 86 %
FEF2575-%Pred-Pre: 61 %
FEV1-%Change-Post: 6 %
FEV1-%Pred-Post: 82 %
FEV1-%Pred-Pre: 77 %
FEV1-Post: 1.28 L
FEV1-Pre: 1.2 L
FEV1FVC-%Change-Post: -4 %
FEV1FVC-%Pred-Pre: 97 %
FEV6-%Change-Post: 10 %
FEV6-%Pred-Post: 92 %
FEV6-%Pred-Pre: 83 %
FEV6-Post: 1.84 L
FEV6-Pre: 1.66 L
FEV6FVC-%Change-Post: 0 %
FEV6FVC-%Pred-Post: 105 %
FEV6FVC-%Pred-Pre: 106 %
FVC-%Change-Post: 11 %
FVC-%Pred-Post: 87 %
FVC-%Pred-Pre: 78 %
FVC-Post: 1.85 L
FVC-Pre: 1.66 L
Post FEV1/FVC ratio: 69 %
Post FEV6/FVC ratio: 100 %
Pre FEV1/FVC ratio: 72 %
Pre FEV6/FVC Ratio: 100 %
RV % pred: 125 %
RV: 2.64 L
TLC % pred: 108 %
TLC: 4.66 L

## 2024-01-20 MED ORDER — ALBUTEROL SULFATE HFA 108 (90 BASE) MCG/ACT IN AERS: 2.0000 | INHALATION_SPRAY | RESPIRATORY_TRACT | 6 refills | Status: AC | PRN

## 2024-01-20 NOTE — Assessment & Plan Note (Signed)
We reviewed your CT scan of the chest today.  There is radiation change in the region where you lung cancer was treated. Agree with repeat CT scan of the chest in April 2025 as recommended by radiation oncology. Follow Dr. Delton Coombes in April after your CT chest so we can review those results together.

## 2024-01-20 NOTE — Patient Instructions (Signed)
We reviewed your CT scan of the chest today.  There is radiation change in the region where you lung cancer was treated. Agree with repeat CT scan of the chest in April 2025 as recommended by radiation oncology. We will give your prescription for albuterol.  You can use 2 puffs up to every 4 hours if needed for shortness of breath, chest tightness, wheezing.  Keep track of how this medication helps you.  If you get benefit we can talk about possibly being on an every day inhaler at some point in the future. Follow Dr. Delton Coombes in April after your CT chest so we can review those results together.

## 2024-01-20 NOTE — Patient Instructions (Signed)
Full PFT performed today.

## 2024-01-20 NOTE — Assessment & Plan Note (Signed)
PFTs reviewed with the patient today does show some evidence for obstruction with him borderline bronchodilator response.  Her function capacity is pretty good.  We discussed possibly starting scheduled inhaled medication versus starting with Sharolyn Douglas as needed.  She wants to try the [place].  If she benefits then we will consider maintenance regimen going forward

## 2024-01-20 NOTE — Progress Notes (Signed)
Subjective:    Patient ID: Ellen Henry, female    DOB: [birth year], 78 y.o.   MRN: [id number]  HPI  78 year old smoker (25 pack years) with a history of hypertension, OSA not on CPAP, GERD, CVA.  She is referred today for evaluation of an abnormal CT scan of the chest.  She has a subsolid mixed density right upper lobe pulmonary nodule that was first identified on a CT of her right shoulder 02/11/2022, followed and was stable on CT chest 06/10/2022.  Most recent scan was a lung cancer screening CT done 05/31/2023 as below. She is still smoking 1-2 cig. No dyspnea, no SOB w exertion. She coughs but not every day, comes in spells, clear mucous. No BDs  CT scan of the chest 05/31/2023 reviewed by me, shows 1.7 cm mixed density pulmonary nodule with a solid central portion of 9 mm that is slightly increased in size compared with the priors.   ROV 01/20/2024 --follow-up visit 78 year old woman with a history of tobacco use, hypertension, OSA not on CPAP, CVA, GERD.  I seen her for an abnormal CT scan of the chest with a mixed density 1.7 cm pulmonary nodule that was adenocarcinoma by bronchoscopy 08/04/2023.  She underwent SBRT, finished in November.  Having serial imaging as per their plans. She is not currently on any BD therapy.  She reports today that she has been well, but was dx w fluid overload and CHF by her PCP in 12/2023 and received diuretics. She is improved. Reports that her breathing does limit her some. She gets SOB w chores, a lot of walking. Occasional wheeze w exertion. Rare cough.   Pulmonary function testing performed today and reviewed by me shows mild to moderate obstruction with a borderline bronchodilator response, normal lung volumes, decreased diffusion capacity.  Ct chest 12/17/23 reviewed by me shows some increased semisolid right upper lobe opacity in the area where she was treated with radiation.  Suspect that this is postradiation change but will need follow-up to ensure no  evidence of persistent/progressive adenocarcinoma  Review of Systems As per HPI  Past Medical History:  Diagnosis Date   Acute kidney failure due to procedure    Arthritis    Complication of anesthesia 12/09/2017   dyspnea post right interscalene nerve block (at [facility]) with likely phrenic nerve involement by Alicia Amel transported to [facility])   Dry skin    GERD (gastroesophageal reflux disease)    "years ago", diet controlled   Gout    H/O pyelonephritis    as a child   Heart murmur    since birth, denies any problems. Echo 12/17/16   History of sepsis    after spinal surgery   Hypertension    Impingement syndrome of right shoulder    Lung cancer (HCC) 08/23/2023   Neuropathy    Osteoarthritis of AC (acromioclavicular) joint    Right   Pneumonia    Radiculopathy    Rotator cuff tear, right    Sleep apnea    does not use cpap   Stroke (HCC) 12/15/2016   left side weakness     Family History  Problem Relation Age of Onset   Varicose Veins Mother      Social History   Socioeconomic History   Marital status: Widowed    Spouse name: Not on file   Number of children: 3   Years of education: Not on file   Highest education level: Not on file  Occupational  History   Not on file  Tobacco Use   Smoking status: Every Day    Current packs/day: 0.00    Types: Cigarettes    Last attempt to quit: 07/07/2022    Years since quitting: 1.5    Passive exposure: Current (1/2 pack)   Smokeless tobacco: Never  Vaping Use   Vaping status: Never Used  Substance and Sexual Activity   Alcohol use: No   Drug use: No    Comment: sometimes will take a vicodin from friend   Sexual activity: Never  Other Topics Concern   Not on file  Social History Narrative   ** Merged History Encounter **       Social Drivers of Health   Financial Resource Strain: Not on file  Food Insecurity: Food Insecurity Present (09/22/2023)   Hunger Vital Sign    Worried About Running  Out of Food in the Last Year: Often true    Ran Out of Food in the Last Year: Often true  Transportation Needs: Unmet Transportation Needs (09/22/2023)   PRAPARE - Administrator, Civil Service (Medical): Yes    Lack of Transportation (Non-Medical): Yes  Physical Activity: Not on file  Stress: Not on file  Social Connections: Not on file  Intimate Partner Violence: Not At Risk (09/22/2023)   Humiliation, Afraid, Rape, and Kick questionnaire    Fear of Current or Ex-Partner: No    Emotionally Abused: No    Physically Abused: No    Sexually Abused: No    Has worked Clinical biochemist.  Has lived [place] No military    Allergies  Allergen Reactions   Food Allergy Formula Other (See Comments)    Shellfish-banana-beef-flares up her gout     Outpatient Medications Prior to Visit  Medication Sig Dispense Refill   amLODipine (NORVASC) 5 MG tablet Take 5 mg by mouth daily.     aspirin EC 81 MG tablet Take 1 tablet (81 mg total) by mouth at bedtime. Swallow whole. 30 tablet 12   gabapentin (NEURONTIN) 100 MG capsule Take 1 capsule (100 mg total) by mouth 3 (three) times daily. 90 capsule 0   ibuprofen (ADVIL) 400 MG tablet Take 1 tablet (400 mg total) by mouth every 8 (eight) hours as needed for mild pain or moderate pain (alternate with tylenol and oxycodone). 30 tablet 0   indapamide (LOZOL) 1.25 MG tablet Take 1.25 mg by mouth every morning.     rosuvastatin (CRESTOR) 20 MG tablet Take 20 mg by mouth at bedtime.     telmisartan (MICARDIS) 40 MG tablet Take 40 mg by mouth every morning.     No facility-administered medications prior to visit.        Objective:   Physical Exam  Vitals:   01/20/24 1104  BP: 116/78  Pulse: 87  SpO2: 99%  Weight: 221 lb (100.2 kg)  Height: 4\' 11"  (1.499 m)    Gen: Pleasant, overweight woman, in no distress,  normal affect  ENT: No lesions,  mouth clear,  oropharynx clear, no postnasal drip  Neck: No JVD, no stridor  Lungs: No  use of accessory muscles, no crackles or wheezing on normal respiration, no wheeze on forced expiration  Cardiovascular: RRR, heart sounds normal, no murmur or gallops, no peripheral edema  Musculoskeletal: No deformities, no cyanosis or clubbing  Neuro: alert, awake, non focal  Skin: Warm, no lesions or rash     Assessment & Plan:  Adenocarcinoma, lung, right (HCC) We reviewed  your CT scan of the chest today.  There is radiation change in the region where you lung cancer was treated. Agree with repeat CT scan of the chest in April 2025 as recommended by radiation oncology. Follow Dr. Delton Coombes in April after your CT chest so we can review those results together.  COPD (chronic obstructive pulmonary disease) (HCC) PFTs reviewed with the patient today does show some evidence for obstruction with him borderline bronchodilator response.  Her function capacity is pretty good.  We discussed possibly starting scheduled inhaled medication versus starting with Sharolyn Douglas as needed.  She wants to try the [place].  If she benefits then we will consider maintenance regimen going forward    Levy Pupa, MD, PhD 01/20/2024, 12:58 PM [facility] [phone] or if no answer before 7:00PM call [phone] For any issues after 7:00PM please call eLink [phone]

## 2024-01-20 NOTE — Progress Notes (Signed)
Full PFT performed today.

## 2024-01-27 ENCOUNTER — Telehealth: Payer: Self-pay | Admitting: Internal Medicine

## 2024-01-27 NOTE — Telephone Encounter (Signed)
 [facility] @ [place] Clinical Research Coordinator note:   This visit for Subject Ellen Henry with DOB: [birth year] on 01/27/2024. Subject contacted regarding [id number] to inform that Principal Investigator has changed to Dr. Delton Coombes. Voicemail left requesting a return call.

## 2024-01-31 DIAGNOSIS — I10 Essential (primary) hypertension: Secondary | ICD-10-CM | POA: Diagnosis not present

## 2024-02-07 DIAGNOSIS — C3490 Malignant neoplasm of unspecified part of unspecified bronchus or lung: Secondary | ICD-10-CM | POA: Diagnosis not present

## 2024-02-07 DIAGNOSIS — I10 Essential (primary) hypertension: Secondary | ICD-10-CM | POA: Diagnosis not present

## 2024-02-07 DIAGNOSIS — E1169 Type 2 diabetes mellitus with other specified complication: Secondary | ICD-10-CM | POA: Diagnosis not present

## 2024-02-07 DIAGNOSIS — R600 Localized edema: Secondary | ICD-10-CM | POA: Diagnosis not present

## 2024-02-28 DIAGNOSIS — C3490 Malignant neoplasm of unspecified part of unspecified bronchus or lung: Secondary | ICD-10-CM | POA: Diagnosis not present

## 2024-02-28 DIAGNOSIS — I10 Essential (primary) hypertension: Secondary | ICD-10-CM | POA: Diagnosis not present

## 2024-02-28 DIAGNOSIS — J441 Chronic obstructive pulmonary disease with (acute) exacerbation: Secondary | ICD-10-CM | POA: Diagnosis not present

## 2024-02-28 DIAGNOSIS — R600 Localized edema: Secondary | ICD-10-CM | POA: Diagnosis not present

## 2024-03-04 ENCOUNTER — Ambulatory Visit (HOSPITAL_COMMUNITY)
Admission: RE | Admit: 2024-03-04 | Discharge: 2024-03-04 | Disposition: A | Discharge status: Home / Self Care | Source: Ambulatory Visit | Attending: Radiation Oncology | Admitting: Radiation Oncology

## 2024-03-04 DIAGNOSIS — C3490 Malignant neoplasm of unspecified part of unspecified bronchus or lung: Secondary | ICD-10-CM | POA: Diagnosis not present

## 2024-03-04 DIAGNOSIS — J984 Other disorders of lung: Secondary | ICD-10-CM | POA: Diagnosis not present

## 2024-03-04 DIAGNOSIS — C3411 Malignant neoplasm of upper lobe, right bronchus or lung: Secondary | ICD-10-CM | POA: Diagnosis not present

## 2024-03-04 DIAGNOSIS — I700 Atherosclerosis of aorta: Secondary | ICD-10-CM | POA: Diagnosis not present

## 2024-03-04 LAB — POCT I-STAT CREATININE: Creatinine, Ser: 1.4 mg/dL — ABNORMAL HIGH (ref 0.44–1.00)

## 2024-03-04 MED ORDER — SODIUM CHLORIDE (PF) 0.9 % IJ SOLN
INTRAMUSCULAR | Status: AC
  Filled 2024-03-04: qty 50

## 2024-03-04 MED ORDER — IOHEXOL 300 MG/ML  SOLN
75.0000 mL | Freq: Once | INTRAMUSCULAR | Status: AC | PRN
  Administered 2024-03-04: 75 mL via INTRAVENOUS

## 2024-03-13 DIAGNOSIS — M25572 Pain in left ankle and joints of left foot: Secondary | ICD-10-CM | POA: Diagnosis not present

## 2024-03-13 DIAGNOSIS — S99911A Unspecified injury of right ankle, initial encounter: Secondary | ICD-10-CM | POA: Diagnosis not present

## 2024-03-22 ENCOUNTER — Telehealth: Payer: Self-pay | Admitting: Radiation Oncology

## 2024-03-22 ENCOUNTER — Other Ambulatory Visit: Payer: Self-pay | Admitting: Radiation Oncology

## 2024-03-22 DIAGNOSIS — C3411 Malignant neoplasm of upper lobe, right bronchus or lung: Secondary | ICD-10-CM

## 2024-03-22 DIAGNOSIS — C3491 Malignant neoplasm of unspecified part of right bronchus or lung: Secondary | ICD-10-CM

## 2024-03-22 NOTE — Progress Notes (Signed)
 I spoke with the patient to let her know the results of her CT scan. She states she's had scalp pain in the central aspect of her scalp for several months that bothers her daily. She has not had visual or auditory changes, but acknowledges some dizziness. She denies changes recently in her hairstyle or with any chemical products used on her hair. No symptoms of alopecia are noted. No other complaints are verbalized. We discussed the rationale to consider an MRI of the brain to rule out any focal findings given her history of lung cancer. She is in agreement and we will follow up by phone without any scheduled appointments.

## 2024-03-22 NOTE — Telephone Encounter (Signed)
 I called and spoke with the patient's daughter and shared Ellen Henry's CT results. I will call the patient again as well to confirm but we can move her appt to 6 months with her next CT scan

## 2024-03-23 ENCOUNTER — Telehealth: Payer: Self-pay | Admitting: *Deleted

## 2024-03-23 NOTE — Telephone Encounter (Signed)
 Called patient to inform of MRI for 03-25-24- arrival time- 8:30 am @ [facility], no restrictions to scan, patient to receive results from Allana Ishikawa on 04/10/24 @ 2:30 pm via telephone, spoke with patient and she is aware of these appts. and the instructions

## 2024-03-25 ENCOUNTER — Ambulatory Visit (HOSPITAL_COMMUNITY)
Admission: RE | Admit: 2024-03-25 | Discharge: 2024-03-25 | Disposition: A | Discharge status: Home / Self Care | Source: Ambulatory Visit | Attending: Radiation Oncology | Admitting: Radiation Oncology

## 2024-03-25 DIAGNOSIS — C3491 Malignant neoplasm of unspecified part of right bronchus or lung: Secondary | ICD-10-CM | POA: Insufficient documentation

## 2024-03-25 DIAGNOSIS — C3490 Malignant neoplasm of unspecified part of unspecified bronchus or lung: Secondary | ICD-10-CM | POA: Diagnosis not present

## 2024-03-25 DIAGNOSIS — I6782 Cerebral ischemia: Secondary | ICD-10-CM | POA: Diagnosis not present

## 2024-03-25 DIAGNOSIS — R9089 Other abnormal findings on diagnostic imaging of central nervous system: Secondary | ICD-10-CM | POA: Diagnosis not present

## 2024-03-25 MED ORDER — GADOBUTROL 1 MMOL/ML IV SOLN
10.0000 mL | Freq: Once | INTRAVENOUS | Status: AC | PRN
  Administered 2024-03-25: 10 mL via INTRAVENOUS

## 2024-03-30 ENCOUNTER — Ambulatory Visit: Payer: Medicare Other | Admitting: Emergency Medicine

## 2024-03-30 ENCOUNTER — Encounter: Payer: Self-pay | Admitting: Emergency Medicine

## 2024-03-30 VITALS — BP 98/60 | HR 74 | Ht 59.0 in | Wt 205.8 lb

## 2024-03-30 DIAGNOSIS — C3491 Malignant neoplasm of unspecified part of right bronchus or lung: Secondary | ICD-10-CM | POA: Diagnosis not present

## 2024-03-30 DIAGNOSIS — J449 Chronic obstructive pulmonary disease, unspecified: Secondary | ICD-10-CM | POA: Diagnosis not present

## 2024-03-30 NOTE — Progress Notes (Signed)
   Subjective:    Patient ID: Ellen Henry, female    DOB: 1946/05/22, 78 y.o.   MRN: [id number]  HPI  ROV 03/30/2024 --78 year old woman with history of adenocarcinoma treated by SBRT completed in 10/2023, COPD, hypertension, OSA not on CPAP, CVA, GERD.  She has obstruction on PFT and we did a trial of albuterol  at her last visit to see if she would get benefit. She is having persistent scalp pain, HA. An MRI brain is pending.  She is using albuterol  - has had some benefit when she has exertional SOB, also some wheeze at night. Using about   CT scan of the chest 03/04/2024 reviewed by me, shows less confluent irregular mixed density lesion in the right upper lobe, smaller now 2.3 x 2.0 cm consistent with treatment response.  No new nodules or masses seen.    Review of Systems As per HPI      Objective:   Physical Exam  Vitals:   03/30/24 1015 03/30/24 1016  BP: (!) 88/64 98/60  Pulse: 74   SpO2: 98%   Weight: 205 lb 12.8 oz (93.4 kg)   Height: 4\' 11"  (1.499 m)     Gen: Pleasant, overweight woman, in no distress,  normal affect  ENT: No lesions,  mouth clear,  oropharynx clear, no postnasal drip  Neck: No JVD, no stridor  Lungs: No use of accessory muscles, no crackles or wheezing on normal respiration, no wheeze on forced expiration  Cardiovascular: RRR, heart sounds normal, no murmur or gallops, no peripheral edema  Musculoskeletal: No deformities, no cyanosis or clubbing  Neuro: alert, awake, non focal  Skin: Warm, no lesions or rash     Assessment & Plan:  Adenocarcinoma, lung, right (HCC) Reassuring CT chest.  She is doing serial imaging with radiation oncology, next to be done in September or October.  She will follow that up with them.  If there is an interval change that needs intervention then I will see her and we will plan procedure.  COPD (chronic obstructive pulmonary disease) (HCC) Keep albuterol  available to use 2 puffs when needed for shortness of  breath, chest tightness, wheezing.  Agree with using 2 puffs prior to planned exertion.  If you notice that you are using your albuterol  frequently through the day then we could consider starting you on a longer acting scheduled inhaler medication in the future Please follow Dr. Baldwin Levee in 1 year, sooner if you have any problems.     Racheal Buddle, MD, PhD 03/30/2024, 10:43 AM [facility] [phone] or if no answer before 7:00PM call [phone] For any issues after 7:00PM please call eLink [phone]

## 2024-03-30 NOTE — Patient Instructions (Signed)
 We reviewed your CT scan of the chest today. Please get your repeat chest imaging with radiation oncology as planned Keep albuterol  available to use 2 puffs when needed for shortness of breath, chest tightness, wheezing.  Agree with using 2 puffs prior to planned exertion.  If you notice that you are using your albuterol  frequently through the day then we could consider starting you on a longer acting scheduled inhaler medication in the future Please follow Dr. Baldwin Levee in 1 year, sooner if you have any problems.

## 2024-03-30 NOTE — Assessment & Plan Note (Signed)
 Keep albuterol  available to use 2 puffs when needed for shortness of breath, chest tightness, wheezing.  Agree with using 2 puffs prior to planned exertion.  If you notice that you are using your albuterol  frequently through the day then we could consider starting you on a longer acting scheduled inhaler medication in the future Please follow Dr. Baldwin Levee in 1 year, sooner if you have any problems.

## 2024-03-30 NOTE — Assessment & Plan Note (Signed)
 Reassuring CT chest.  She is doing serial imaging with radiation oncology, next to be done in September or October.  She will follow that up with them.  If there is an interval change that needs intervention then I will see her and we will plan procedure.

## 2024-04-03 ENCOUNTER — Ambulatory Visit: Payer: Medicare Other | Admitting: Radiation Oncology

## 2024-04-10 ENCOUNTER — Ambulatory Visit: Admitting: Radiation Oncology

## 2024-04-13 DIAGNOSIS — R52 Pain, unspecified: Secondary | ICD-10-CM | POA: Diagnosis not present

## 2024-04-17 ENCOUNTER — Telehealth: Payer: Self-pay | Admitting: Radiation Oncology

## 2024-04-17 NOTE — Telephone Encounter (Signed)
 I called to let the patient know her MRI results and she will follow up with me again for her next scan of her chest this fall.

## 2024-04-20 ENCOUNTER — Other Ambulatory Visit (HOSPITAL_COMMUNITY): Payer: Self-pay

## 2024-05-15 DIAGNOSIS — C3490 Malignant neoplasm of unspecified part of unspecified bronchus or lung: Secondary | ICD-10-CM | POA: Diagnosis not present

## 2024-05-15 DIAGNOSIS — K9189 Other postprocedural complications and disorders of digestive system: Secondary | ICD-10-CM | POA: Diagnosis not present

## 2024-05-15 DIAGNOSIS — G63 Polyneuropathy in diseases classified elsewhere: Secondary | ICD-10-CM | POA: Diagnosis not present

## 2024-05-15 DIAGNOSIS — I10 Essential (primary) hypertension: Secondary | ICD-10-CM | POA: Diagnosis not present

## 2024-06-01 ENCOUNTER — Telehealth: Payer: Self-pay

## 2024-06-01 NOTE — Progress Notes (Signed)
   06/01/2024  Patient ID: Ellen Henry, female   DOB: 1946-09-08, 77 y.o.   MRN: [id number]  Contacted patient for medication assistance renewal. Was unable to reach today, left HIPAA compliant voicemail. I will follow up next week.   Heather Factor, PharmD Clinical Pharmacist  [phone]

## 2024-06-12 NOTE — Progress Notes (Signed)
   06/12/2024  Patient ID: Ellen Henry, female   DOB: 01-12-1946, 78 y.o.   MRN: [id number]  Contacted patient for medication assistance renewal. Was unable to reach today, left HIPAA compliant voicemail. I will follow up next week.   Heather Factor, PharmD Clinical Pharmacist  [phone]

## 2024-06-22 NOTE — Progress Notes (Signed)
   06/22/2024  Patient ID: Ellen Henry, female   DOB: November 23, 1946, 77 y.o.   MRN: [id number]  Contacted patient for medication assistance renewal. Was unable to reach today, left HIPAA compliant voicemail.   I will continue to follow up with the patient. She has an upcoming PCP appointment on 07/17/24.  Heather Factor, PharmD Clinical Pharmacist  [phone]

## 2024-06-26 DIAGNOSIS — H2513 Age-related nuclear cataract, bilateral: Secondary | ICD-10-CM | POA: Diagnosis not present

## 2024-06-26 DIAGNOSIS — H524 Presbyopia: Secondary | ICD-10-CM | POA: Diagnosis not present

## 2024-06-29 NOTE — Progress Notes (Signed)
   06/29/2024  Patient ID: Ellen Henry, female   DOB: 05-28-1946, 78 y.o.   MRN: [id number]  Contacted patient for medication assistance renewal. Was unable to reach today, left HIPAA compliant voicemail.   I will continue to follow up with the patient. She has an upcoming PCP appointment on 07/17/24.  Heather Factor, PharmD Clinical Pharmacist  [phone]

## 2024-07-28 DIAGNOSIS — Z1231 Encounter for screening mammogram for malignant neoplasm of breast: Secondary | ICD-10-CM | POA: Diagnosis not present

## 2024-08-28 DIAGNOSIS — R600 Localized edema: Secondary | ICD-10-CM | POA: Diagnosis not present

## 2024-08-28 DIAGNOSIS — E1165 Type 2 diabetes mellitus with hyperglycemia: Secondary | ICD-10-CM | POA: Diagnosis not present

## 2024-08-28 DIAGNOSIS — J441 Chronic obstructive pulmonary disease with (acute) exacerbation: Secondary | ICD-10-CM | POA: Diagnosis not present

## 2024-08-28 DIAGNOSIS — M130 Polyarthritis, unspecified: Secondary | ICD-10-CM | POA: Diagnosis not present

## 2024-08-28 DIAGNOSIS — I10 Essential (primary) hypertension: Secondary | ICD-10-CM | POA: Diagnosis not present

## 2024-08-28 DIAGNOSIS — C3490 Malignant neoplasm of unspecified part of unspecified bronchus or lung: Secondary | ICD-10-CM | POA: Diagnosis not present

## 2024-08-28 DIAGNOSIS — C3410 Malignant neoplasm of upper lobe, unspecified bronchus or lung: Secondary | ICD-10-CM | POA: Diagnosis not present

## 2024-09-07 ENCOUNTER — Telehealth: Payer: Self-pay | Admitting: *Deleted

## 2024-09-07 NOTE — Telephone Encounter (Signed)
 CALLED PATIENT TO INFORM OF CT SCAN FOR 10-22-24- ARRIVAL TIME- 2:15 PM @ [facility], NO RESTRICTIONS TO SCAN, PATIENT TO RECEIVE RESULTS FROM ALISON PERKINS ON 10-09-24 @ 1 PM VIA TELEPHONE, SPOKE WITH PATIENT AND SHE IS AWARE OF THESE APPTS AND THE INSTRUCTIONS

## 2024-09-20 ENCOUNTER — Ambulatory Visit: Admitting: Neurology

## 2024-09-21 ENCOUNTER — Ambulatory Visit (HOSPITAL_COMMUNITY)
Admission: RE | Admit: 2024-09-21 | Discharge: 2024-09-21 | Disposition: A | Discharge status: Home / Self Care | Source: Ambulatory Visit | Attending: Radiation Oncology | Admitting: Radiation Oncology

## 2024-09-21 DIAGNOSIS — C3411 Malignant neoplasm of upper lobe, right bronchus or lung: Secondary | ICD-10-CM | POA: Diagnosis not present

## 2024-09-21 DIAGNOSIS — C3490 Malignant neoplasm of unspecified part of unspecified bronchus or lung: Secondary | ICD-10-CM | POA: Diagnosis not present

## 2024-09-21 LAB — POCT I-STAT CREATININE: Creatinine, Ser: 1.2 mg/dL — ABNORMAL HIGH (ref 0.44–1.00)

## 2024-09-21 MED ORDER — IOHEXOL 300 MG/ML  SOLN
75.0000 mL | Freq: Once | INTRAMUSCULAR | Status: AC | PRN
  Administered 2024-09-21: 75 mL via INTRAVENOUS

## 2024-09-28 ENCOUNTER — Telehealth: Payer: Self-pay | Admitting: Radiation Oncology

## 2024-09-28 DIAGNOSIS — C3411 Malignant neoplasm of upper lobe, right bronchus or lung: Secondary | ICD-10-CM

## 2024-09-28 NOTE — Telephone Encounter (Signed)
 I spoke with the patient regarding her CT chest and we discussed the reassuring results. She is in agreement to repeat this scan in 6 months time, and to contact our office if she has questions prior to that visit.

## 2024-10-04 ENCOUNTER — Encounter: Payer: Self-pay | Admitting: *Deleted

## 2024-10-04 NOTE — Progress Notes (Signed)
 Ellen Henry                                          MRN: [id number]   10/04/2024   The [facility] Quality Team Specialist reviewed this patient medical record for the purposes of chart review for care gap closure. The following were reviewed: chart review for care gap closure-kidney health evaluation for diabetes:eGFR  and uACR.    [facility] Quality Team

## 2024-10-09 ENCOUNTER — Ambulatory Visit: Admitting: Radiation Oncology

## 2024-10-09 ENCOUNTER — Other Ambulatory Visit: Payer: Self-pay | Admitting: Family Medicine

## 2024-10-09 ENCOUNTER — Ambulatory Visit
Admission: RE | Admit: 2024-10-09 | Discharge: 2024-10-09 | Disposition: A | Discharge status: Home / Self Care | Source: Ambulatory Visit | Attending: Family Medicine | Admitting: Family Medicine

## 2024-10-09 DIAGNOSIS — M25562 Pain in left knee: Secondary | ICD-10-CM

## 2024-12-21 ENCOUNTER — Telehealth: Payer: Self-pay

## 2024-12-21 NOTE — Telephone Encounter (Signed)
 Copied from CRM [id number]. Topic: General - Other >> Dec 21, 2024  3:14 PM Russell PARAS wrote: Reason for CRM:   Pt is contacting clinic regarding a surgical clearance request sent by Dr. Yvone at [facility]. His surgical coordinator Dagoberto has been attempting to reach the clinic, so that she can schedule pts hip surgery.  Pt requested clearance be sent to their clinic  CB#   [phone](Orthopedic)  She requested call back for status update once clearance was sent  CB#  [phone]   Spoke with patient got her in sooner to be seen for her surgical clearance

## 2025-01-02 ENCOUNTER — Ambulatory Visit

## 2025-01-02 NOTE — Progress Notes (Incomplete)
" ° °  Subjective:    Patient ID: Ellen Henry, female    DOB: 10-06-1946, 79 y.o.   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  She has obstruction on PFT and it has some benefit of albuterol  trial.  She had having persistent scalp pain, HA, with negative MRI for metastasis.       Review of Systems As per HPI      Objective:   Physical Exam  There were no vitals filed for this visit.   Gen: Pleasant, overweight woman, in no distress,  normal affect  ENT: No lesions,  mouth clear,  oropharynx clear, no postnasal drip  Neck: No JVD, no stridor  Lungs: No use of accessory muscles, no crackles or wheezing on normal respiration, no wheeze on forced expiration  Cardiovascular: RRR, heart sounds normal, no murmur or gallops, no peripheral edema  Musculoskeletal: No deformities, no cyanosis or clubbing  Neuro: alert, awake, non focal  Skin: Warm, no lesions or rash  CT scan of the chest 03/04/2024 reviewed by me, shows less confluent irregular mixed density lesion in the right upper lobe, smaller now 2.3 x 2.0 cm consistent with treatment response.  No new nodules or masses seen.    CT chest 09/21/24 - irregular linear opacity in the RUL, when compared with 02/2024 there is interval increase, representing evolving postradiation changes. No new lung mass.    Assessment & Plan:  No problem-specific Assessment & Plan notes found for this encounter.   Adenocarcinoma, lung, right (HCC) Reassuring CT chest.  She is doing serial imaging with radiation oncology, next to be done in September or October.  She will follow that up with them.  If there is an interval change that needs intervention then I will see her and we will plan procedure. Repeat CT scan in 2months  Surgical clearance     COPD (chronic obstructive pulmonary disease) (HCC) Keep albuterol  available  to use 2 puffs when needed for shortness of breath, chest tightness, wheezing.  Agree with using 2 puffs prior to planned exertion.  If you notice that you are using your albuterol  frequently through the day then we could consider starting you on a longer acting scheduled inhaler medication in the future Please follow Dr. Shelah in 1 year, sooner if you have any problems.   Ellen Hartung sanchez,MD 01/02/2025, 8:31 AM [facility] [phone] or if no answer before 7:00PM call [phone] For any issues after 7:00PM please call eLink [phone]   "

## 2025-01-11 ENCOUNTER — Ambulatory Visit: Admitting: Emergency Medicine

## 2025-02-09 ENCOUNTER — Ambulatory Visit: Admitting: Emergency Medicine

## 2025-04-04 ENCOUNTER — Ambulatory Visit: Admitting: Emergency Medicine

## 2025-04-09 ENCOUNTER — Ambulatory Visit: Admitting: Radiation Oncology
